# Patient Record
Sex: Female | Born: 1950 | Race: White | Hispanic: No | Marital: Married | Smoking: Never smoker
Health system: Southern US, Community
[De-identification: ages and names within clinical notes are randomized; demographics above are authoritative.]

## PROBLEM LIST (undated history)

## (undated) DIAGNOSIS — S82409A Unspecified fracture of shaft of unspecified fibula, initial encounter for closed fracture: Secondary | ICD-10-CM

## (undated) DIAGNOSIS — K589 Irritable bowel syndrome without diarrhea: Secondary | ICD-10-CM

## (undated) DIAGNOSIS — M810 Age-related osteoporosis without current pathological fracture: Secondary | ICD-10-CM

## (undated) DIAGNOSIS — J329 Chronic sinusitis, unspecified: Secondary | ICD-10-CM

## (undated) DIAGNOSIS — K449 Diaphragmatic hernia without obstruction or gangrene: Secondary | ICD-10-CM

## (undated) DIAGNOSIS — D500 Iron deficiency anemia secondary to blood loss (chronic): Secondary | ICD-10-CM

## (undated) DIAGNOSIS — F329 Major depressive disorder, single episode, unspecified: Secondary | ICD-10-CM

## (undated) DIAGNOSIS — T7840XA Allergy, unspecified, initial encounter: Secondary | ICD-10-CM

## (undated) DIAGNOSIS — E785 Hyperlipidemia, unspecified: Secondary | ICD-10-CM

## (undated) DIAGNOSIS — F32A Depression, unspecified: Secondary | ICD-10-CM

## (undated) DIAGNOSIS — R87619 Unspecified abnormal cytological findings in specimens from cervix uteri: Secondary | ICD-10-CM

## (undated) DIAGNOSIS — K219 Gastro-esophageal reflux disease without esophagitis: Secondary | ICD-10-CM

## (undated) DIAGNOSIS — D259 Leiomyoma of uterus, unspecified: Secondary | ICD-10-CM

## (undated) DIAGNOSIS — E041 Nontoxic single thyroid nodule: Secondary | ICD-10-CM

## (undated) DIAGNOSIS — J45909 Unspecified asthma, uncomplicated: Secondary | ICD-10-CM

## (undated) DIAGNOSIS — F419 Anxiety disorder, unspecified: Secondary | ICD-10-CM

## (undated) DIAGNOSIS — E119 Type 2 diabetes mellitus without complications: Secondary | ICD-10-CM

## (undated) DIAGNOSIS — D509 Iron deficiency anemia, unspecified: Secondary | ICD-10-CM

## (undated) DIAGNOSIS — I999 Unspecified disorder of circulatory system: Secondary | ICD-10-CM

## (undated) DIAGNOSIS — I10 Essential (primary) hypertension: Secondary | ICD-10-CM

## (undated) DIAGNOSIS — Z86010 Personal history of colonic polyps: Principal | ICD-10-CM

## (undated) DIAGNOSIS — K754 Autoimmune hepatitis: Secondary | ICD-10-CM

## (undated) DIAGNOSIS — K429 Umbilical hernia without obstruction or gangrene: Secondary | ICD-10-CM

## (undated) DIAGNOSIS — K31819 Angiodysplasia of stomach and duodenum without bleeding: Secondary | ICD-10-CM

## (undated) HISTORY — DX: Allergy, unspecified, initial encounter: T78.40XA

## (undated) HISTORY — DX: Iron deficiency anemia, unspecified: D50.9

## (undated) HISTORY — DX: Essential (primary) hypertension: I10

## (undated) HISTORY — DX: Unspecified disorder of circulatory system: I99.9

## (undated) HISTORY — DX: Depression, unspecified: F32.A

## (undated) HISTORY — DX: Personal history of colonic polyps: Z86.010

## (undated) HISTORY — PX: NASAL SEPTOPLASTY W/ TURBINOPLASTY: SHX2070

## (undated) HISTORY — DX: Leiomyoma of uterus, unspecified: D25.9

## (undated) HISTORY — DX: Nontoxic single thyroid nodule: E04.1

## (undated) HISTORY — DX: Diaphragmatic hernia without obstruction or gangrene: K44.9

## (undated) HISTORY — DX: Iron deficiency anemia secondary to blood loss (chronic): D50.0

## (undated) HISTORY — DX: Age-related osteoporosis without current pathological fracture: M81.0

## (undated) HISTORY — PX: COLONOSCOPY: SHX174

## (undated) HISTORY — DX: Irritable bowel syndrome, unspecified: K58.9

## (undated) HISTORY — DX: Unspecified asthma, uncomplicated: J45.909

## (undated) HISTORY — DX: Autoimmune hepatitis: K75.4

## (undated) HISTORY — DX: Umbilical hernia without obstruction or gangrene: K42.9

## (undated) HISTORY — DX: Anxiety disorder, unspecified: F41.9

## (undated) HISTORY — DX: Chronic sinusitis, unspecified: J32.9

## (undated) HISTORY — DX: Type 2 diabetes mellitus without complications: E11.9

## (undated) HISTORY — PX: CATARACT EXTRACTION: SUR2

## (undated) HISTORY — PX: EYE SURGERY: SHX253

## (undated) HISTORY — DX: Unspecified abnormal cytological findings in specimens from cervix uteri: R87.619

## (undated) HISTORY — DX: Hyperlipidemia, unspecified: E78.5

## (undated) HISTORY — DX: Gastro-esophageal reflux disease without esophagitis: K21.9

## (undated) HISTORY — PX: TONSILLECTOMY: SUR1361

## (undated) HISTORY — DX: Major depressive disorder, single episode, unspecified: F32.9

## (undated) HISTORY — DX: Unspecified fracture of shaft of unspecified fibula, initial encounter for closed fracture: S82.409A

## (undated) HISTORY — PX: BREAST EXCISIONAL BIOPSY: SUR124

## (undated) HISTORY — DX: Angiodysplasia of stomach and duodenum without bleeding: K31.819

---

## 1991-11-28 ENCOUNTER — Encounter: Payer: Self-pay | Admitting: Gastroenterology

## 1998-05-15 ENCOUNTER — Other Ambulatory Visit: Admission: RE | Admit: 1998-05-15 | Discharge: 1998-05-15 | Payer: Self-pay | Admitting: Obstetrics and Gynecology

## 1999-05-18 ENCOUNTER — Other Ambulatory Visit: Admission: RE | Admit: 1999-05-18 | Discharge: 1999-05-18 | Payer: Self-pay | Admitting: *Deleted

## 1999-06-05 ENCOUNTER — Encounter: Payer: Self-pay | Admitting: *Deleted

## 1999-06-05 ENCOUNTER — Encounter: Admission: RE | Admit: 1999-06-05 | Discharge: 1999-06-05 | Payer: Self-pay | Admitting: *Deleted

## 2000-01-11 ENCOUNTER — Encounter: Payer: Self-pay | Admitting: Gastroenterology

## 2000-01-11 ENCOUNTER — Ambulatory Visit (HOSPITAL_COMMUNITY): Admission: RE | Admit: 2000-01-11 | Discharge: 2000-01-11 | Payer: Self-pay | Admitting: Gastroenterology

## 2000-05-25 ENCOUNTER — Other Ambulatory Visit: Admission: RE | Admit: 2000-05-25 | Discharge: 2000-05-25 | Payer: Self-pay | Admitting: *Deleted

## 2000-06-09 ENCOUNTER — Encounter: Payer: Self-pay | Admitting: *Deleted

## 2000-06-09 ENCOUNTER — Encounter: Admission: RE | Admit: 2000-06-09 | Discharge: 2000-06-09 | Payer: Self-pay | Admitting: *Deleted

## 2001-06-01 ENCOUNTER — Other Ambulatory Visit: Admission: RE | Admit: 2001-06-01 | Discharge: 2001-06-01 | Payer: Self-pay | Admitting: *Deleted

## 2001-06-12 ENCOUNTER — Encounter: Admission: RE | Admit: 2001-06-12 | Discharge: 2001-06-12 | Payer: Self-pay | Admitting: *Deleted

## 2001-06-12 ENCOUNTER — Encounter: Payer: Self-pay | Admitting: *Deleted

## 2002-02-12 HISTORY — PX: ESOPHAGOGASTRODUODENOSCOPY: SHX1529

## 2002-02-20 ENCOUNTER — Encounter (INDEPENDENT_AMBULATORY_CARE_PROVIDER_SITE_OTHER): Payer: Self-pay | Admitting: Gastroenterology

## 2002-06-05 ENCOUNTER — Other Ambulatory Visit: Admission: RE | Admit: 2002-06-05 | Discharge: 2002-06-05 | Payer: Self-pay | Admitting: *Deleted

## 2002-06-15 ENCOUNTER — Encounter: Payer: Self-pay | Admitting: *Deleted

## 2002-06-15 ENCOUNTER — Encounter: Admission: RE | Admit: 2002-06-15 | Discharge: 2002-06-15 | Payer: Self-pay | Admitting: *Deleted

## 2003-06-25 ENCOUNTER — Encounter: Admission: RE | Admit: 2003-06-25 | Discharge: 2003-06-25 | Payer: Self-pay | Admitting: *Deleted

## 2003-08-08 ENCOUNTER — Other Ambulatory Visit: Admission: RE | Admit: 2003-08-08 | Discharge: 2003-08-08 | Payer: Self-pay | Admitting: *Deleted

## 2004-08-10 ENCOUNTER — Ambulatory Visit: Payer: Self-pay | Admitting: Gastroenterology

## 2004-08-20 ENCOUNTER — Ambulatory Visit: Payer: Self-pay | Admitting: Gastroenterology

## 2004-09-22 ENCOUNTER — Other Ambulatory Visit: Admission: RE | Admit: 2004-09-22 | Discharge: 2004-09-22 | Payer: Self-pay | Admitting: Obstetrics and Gynecology

## 2004-10-01 ENCOUNTER — Ambulatory Visit: Payer: Self-pay | Admitting: Gastroenterology

## 2004-10-23 ENCOUNTER — Ambulatory Visit: Payer: Self-pay | Admitting: Gastroenterology

## 2004-11-18 ENCOUNTER — Ambulatory Visit: Payer: Self-pay | Admitting: Gastroenterology

## 2005-01-04 ENCOUNTER — Ambulatory Visit: Payer: Self-pay | Admitting: Gastroenterology

## 2005-03-17 ENCOUNTER — Ambulatory Visit: Payer: Self-pay | Admitting: Gastroenterology

## 2005-06-30 ENCOUNTER — Ambulatory Visit: Payer: Self-pay | Admitting: Gastroenterology

## 2005-10-11 ENCOUNTER — Ambulatory Visit (HOSPITAL_COMMUNITY): Admission: RE | Admit: 2005-10-11 | Discharge: 2005-10-11 | Payer: Self-pay | Admitting: Allergy

## 2006-02-21 ENCOUNTER — Ambulatory Visit: Payer: Self-pay | Admitting: Internal Medicine

## 2006-05-27 ENCOUNTER — Ambulatory Visit: Payer: Self-pay | Admitting: Gastroenterology

## 2006-05-27 LAB — CONVERTED CEMR LAB
ALT: 28 units/L (ref 0–40)
Alkaline Phosphatase: 92 units/L (ref 39–117)
Basophils Absolute: 0 10*3/uL (ref 0.0–0.1)
Chloride: 93 meq/L — ABNORMAL LOW (ref 96–112)
Eosinophil percent: 1.7 % (ref 0.0–5.0)
Glucose, Bld: 131 mg/dL — ABNORMAL HIGH (ref 70–99)
Lymphocytes Relative: 34.6 % (ref 12.0–46.0)
MCHC: 34 g/dL (ref 30.0–36.0)
MCV: 100.6 fL — ABNORMAL HIGH (ref 78.0–100.0)
Monocytes Relative: 7.2 % (ref 3.0–11.0)
Neutro Abs: 4.9 10*3/uL (ref 1.4–7.7)
Platelets: 251 10*3/uL (ref 150–400)
Potassium: 3.3 meq/L — ABNORMAL LOW (ref 3.5–5.1)
Sodium: 133 meq/L — ABNORMAL LOW (ref 135–145)

## 2006-10-28 ENCOUNTER — Ambulatory Visit: Payer: Self-pay | Admitting: Gastroenterology

## 2006-10-28 LAB — CONVERTED CEMR LAB
Basophils Absolute: 0.1 10*3/uL (ref 0.0–0.1)
Bilirubin, Direct: 0.1 mg/dL (ref 0.0–0.3)
CO2: 31 meq/L (ref 19–32)
Eosinophils Absolute: 0.1 10*3/uL (ref 0.0–0.6)
GFR calc Af Amer: 112 mL/min
Glucose, Bld: 99 mg/dL (ref 70–99)
HCT: 44.7 % (ref 36.0–46.0)
MCHC: 35.1 g/dL (ref 30.0–36.0)
MCV: 99.6 fL (ref 78.0–100.0)
Monocytes Absolute: 0.7 10*3/uL (ref 0.2–0.7)
Neutro Abs: 7.1 10*3/uL (ref 1.4–7.7)
Neutrophils Relative %: 63.3 % (ref 43.0–77.0)
Potassium: 3.1 meq/L — ABNORMAL LOW (ref 3.5–5.1)
RBC: 4.48 M/uL (ref 3.87–5.11)
Sodium: 136 meq/L (ref 135–145)
Total Protein: 7.4 g/dL (ref 6.0–8.3)

## 2006-12-12 LAB — HM DEXA SCAN: HM Dexa Scan: NORMAL

## 2007-03-02 ENCOUNTER — Encounter: Admission: RE | Admit: 2007-03-02 | Discharge: 2007-03-02 | Payer: Self-pay | Admitting: Obstetrics and Gynecology

## 2007-10-24 DIAGNOSIS — K219 Gastro-esophageal reflux disease without esophagitis: Secondary | ICD-10-CM | POA: Insufficient documentation

## 2007-10-24 DIAGNOSIS — E1165 Type 2 diabetes mellitus with hyperglycemia: Secondary | ICD-10-CM

## 2007-10-24 DIAGNOSIS — IMO0002 Reserved for concepts with insufficient information to code with codable children: Secondary | ICD-10-CM | POA: Insufficient documentation

## 2007-10-24 DIAGNOSIS — F411 Generalized anxiety disorder: Secondary | ICD-10-CM | POA: Insufficient documentation

## 2007-10-26 ENCOUNTER — Ambulatory Visit: Payer: Self-pay | Admitting: Internal Medicine

## 2007-10-26 DIAGNOSIS — K589 Irritable bowel syndrome without diarrhea: Secondary | ICD-10-CM | POA: Insufficient documentation

## 2007-10-27 LAB — CONVERTED CEMR LAB
Bilirubin, Direct: 0.1 mg/dL (ref 0.0–0.3)
Total Bilirubin: 0.9 mg/dL (ref 0.3–1.2)

## 2008-10-22 ENCOUNTER — Encounter: Admission: RE | Admit: 2008-10-22 | Discharge: 2008-10-22 | Payer: Self-pay | Admitting: Family Medicine

## 2008-10-30 ENCOUNTER — Ambulatory Visit: Payer: Self-pay | Admitting: Internal Medicine

## 2009-06-20 ENCOUNTER — Encounter (INDEPENDENT_AMBULATORY_CARE_PROVIDER_SITE_OTHER): Payer: Self-pay | Admitting: *Deleted

## 2009-06-23 ENCOUNTER — Ambulatory Visit: Payer: Self-pay | Admitting: Internal Medicine

## 2009-07-07 ENCOUNTER — Ambulatory Visit: Payer: Self-pay | Admitting: Internal Medicine

## 2009-07-07 DIAGNOSIS — Z86010 Personal history of colonic polyps: Secondary | ICD-10-CM

## 2009-07-07 DIAGNOSIS — Z860101 Personal history of adenomatous and serrated colon polyps: Secondary | ICD-10-CM

## 2009-07-07 HISTORY — DX: Personal history of adenomatous and serrated colon polyps: Z86.0101

## 2009-07-07 HISTORY — DX: Personal history of colonic polyps: Z86.010

## 2009-07-09 ENCOUNTER — Encounter: Payer: Self-pay | Admitting: Internal Medicine

## 2009-10-27 ENCOUNTER — Telehealth: Payer: Self-pay | Admitting: Internal Medicine

## 2010-05-20 ENCOUNTER — Ambulatory Visit: Payer: Self-pay | Admitting: Internal Medicine

## 2010-07-06 ENCOUNTER — Encounter: Payer: Self-pay | Admitting: Obstetrics and Gynecology

## 2010-07-16 NOTE — Assessment & Plan Note (Signed)
Summary: FOLLOW UP CROHN'S/GERD//SP   History of Present Illness Visit Type: Follow-up Visit Primary GI MD: Stan Head MD [facility] Primary Jalexus Brett: Grover Canavan, MD Chief Complaint: IBS, Anxiety  and GERD follow-up History of Present Illness:   60 yo ww with IBS, anxiety and GERD. IBS helped by Effexor and alprazolam therapy and Prevacid is controlling GERD. In general doing well with stable rare IBS spells and control of GERD and anxiety. Mother currently in hospital with cerebral hemorrhage which has flared anxiety some.    GI Review of Systems      Denies abdominal pain, acid reflux, belching, bloating, chest pain, dysphagia with liquids, dysphagia with solids, heartburn, loss of appetite, nausea, vomiting, vomiting blood, weight loss, and  weight gain.        Denies anal fissure, black tarry stools, change in bowel habit, constipation, diarrhea, diverticulosis, fecal incontinence, heme positive stool, hemorrhoids, irritable bowel syndrome, jaundice, light color stool, liver problems, rectal bleeding, and  rectal pain.    Current Medications (verified): 1)  Alprazolam 0.5 Mg  Tbdp (Alprazolam) .... Take 1 Tab By Mouth At Bedtime As Needed 2)  Effexor Xr 75 Mg  Cp24 (Venlafaxine Hcl) .... Take 1 Capsule By Mouth Once A Day--Must Make Appt For Further Refills 3)  Maxzide-25 37.5-25 Mg  Tabs (Triamterene-Hctz) .... Once Daily 4)  Prevacid 30 Mg  Cpdr (Lansoprazole) .... Once Daily 5)  Singulair 10 Mg  Tabs (Montelukast Sodium) .... Once Daily 6)  Astelin 137 Mcg/spray  Soln (Azelastine Hcl) .... As Needed 7)  Zyrtec Allergy 10 Mg  Tabs (Cetirizine Hcl) .... As Needed 8)  Simvastatin 20 Mg  Tabs (Simvastatin) .... Take One Every Other Day 9)  Aspirtab 324 Mg Tbec (Aspirin) .... Take One By Mouth As Needed 10)  Benadryl 25 Mg Tabs (Diphenhydramine Hcl) .... Take One By Mouth At Night 11)  Omnaris 50 Mcg/act Susp (Ciclesonide) .... Spray Each Nostril in Morning 12)  Ketoprofen 75 Mg  Caps (Ketoprofen) .... Take One By Mouth As Needed 13)  Optivar 0.05 % Soln (Azelastine Hcl) .... Use in Both Eyes in The Morning 14)  Penicillin V Potassium 500 Mg Tabs (Penicillin V Potassium) .... Take 1 Tablet By Mouth Three Times A Day  Allergies (verified): No Known Drug Allergies  Past History:  Past Medical History: Reviewed history from 10/26/2007 and no changes required. Allergic Rhinitis IBS ANXIETY (ICD-300.00) GERD (ICD-530.81) Left ankle fx Abnormal LFTs Anal Fissure Symptomatic Hemorrhoids  Past Surgical History: Reviewed history from 10/26/2007 and no changes required. nasal septoplasty age 24 tonsillectomy right breast bx, benign 1997  Family History: Reviewed history from 10/30/2008 and no changes required. Family History of Colon Cancer: dad age 77 deceased Family History of Breast Cancer: paternal grandmother Throat cancer : maternal grandfather Family History of lung cancer: paternal grandfather Family History of Pancreatic Cancer:maternal aunt  Social History: Reviewed history from 10/30/2008 and no changes required. Occupation: Best boy Patient has never smoked.  Alcohol Use - yes - Rare Daily Caffeine Use Green tea Illicit Drug Use - no Patient does not get regular exercise.  used to walk  Review of Systems       The patient complains of allergy/sinus.         she follows with Dr. Milus Glazier and LFT's ok, he is treating cholesterol.  Vital Signs:  Patient profile:   60 year old female Height:      66 inches Weight:      152 pounds BMI:  24.62 Pulse rate:   88 / minute Pulse rhythm:   regular BP sitting:   122 / 80  (left arm) Cuff size:   regular  Vitals Entered By: June McMurray CMA Duncan Dull) (May 20, 2010 4:03 PM)  Physical Exam  General:  Well developed, well nourished, no acute distress.   Impression & Recommendations:  Problem # 1:  IRRITABLE BOWEL SYNDROME (ICD-564.1) Assessment Unchanged Has noticed  she will vomit with spells at times - new. She thinks things are overall stable and unchanged otherwise.  Problem # 2:  GERD (ICD-530.81) Assessment: Unchanged doing well on lansoprazole. Continue, refill.  Problem # 3:  ANXIETY (ICD-300.00) Assessment: Deteriorated slightly worse with Mom's problems. Continue effexor and alprazolam.  Patient Instructions: 1)  Please continue current medications.  2)  Please schedule a follow-up appointment in 1 year. 3)  Please schedule a follow-up appointment as needed if problems arise before then.  4)  Copy sent to : Elvina Sidle, MD 5)  The medication list was reviewed and reconciled.  All changed / newly prescribed medications were explained.  A complete medication list was provided to the patient / caregiver. Prescriptions: ALPRAZOLAM 0.5 MG  TBDP (ALPRAZOLAM) Take 1 tab by mouth at bedtime as needed  #30 x 0   Entered and Authorized by:   Iva Boop MD, [facility]   Signed by:   Iva Boop MD, [facility] on 05/20/2010   Method used:   Printed then faxed to ...       [facility] [phone]* (retail)       [phone] [address]       [place], [place]  [address]       Ph: [phone]       Fax: [phone]   RxID:   [id number] PREVACID 30 MG  CPDR (LANSOPRAZOLE) once daily  #30 Capsule x 11   Entered and Authorized by:   Iva Boop MD, [facility]   Signed by:   Iva Boop MD, [facility] on 05/20/2010   Method used:   Electronically to        [facility]. [phone]* (retail)       [phone] [address]       [place], [place]  [address]       Ph: [phone]       Fax: [phone]   RxID:   [id number] EFFEXOR XR 75 MG  CP24 (VENLAFAXINE HCL) Take 1 capsule by mouth once a day--MUST MAKE APPT FOR FURTHER REFILLS  #30 Capsule x 1   Entered and Authorized by:   Iva Boop MD, [facility]   Signed by:   Iva Boop MD, [facility] on 05/20/2010   Method used:   Electronically to         [facility]. [phone]* (retail)       [phone] [address]       [place], [place]  [address]       Ph: [phone]       Fax: [phone]   RxID:   [id number]

## 2010-07-16 NOTE — Procedures (Signed)
Summary: Colonoscopy: tubular adenoma  Patient: Miranda Cochran Note: All result statuses are Final unless otherwise noted.  Tests: (1) Colonoscopy (COL)   COL Colonoscopy           DONE     [facility]     [address]     [place], [place]  [address]           COLONOSCOPY PROCEDURE REPORT           PATIENT:  Miranda, Cochran  MR#:  [id number]     BIRTHDATE:  07/28/1950, 58 yrs. old  GENDER:  female           ENDOSCOPIST:  Iva Boop, MD, [facility]           PROCEDURE DATE:  07/07/2009     PROCEDURE:  Colonoscopy with snare polypectomy     ASA CLASS:  Class II     INDICATIONS:  Elevated Risk Screening Father diagnosed with colon     cancer in Oct 29, 2022, died at 95 from colon cancer           MEDICATIONS:   Fentanyl 100 mcg IV, Versed 10 mg IV, Benadryl 12.5     mg IV           DESCRIPTION OF PROCEDURE:   After the risks benefits and     alternatives of the procedure were thoroughly explained, informed     consent was obtained.  Digital rectal exam was performed and     revealed no abnormalities.   The LB [id number]-[id number] endoscope     was introduced through the anus and advanced to the cecum, which     was identified by both the appendix and ileocecal valve, without     limitations.  The quality of the prep was good, using MiraLax.     The instrument was then slowly withdrawn as the colon was fully     examined.     Insertion: 11:37 minutes Withdrawal: 17:00 minutes     <<PROCEDUREIMAGES>>           FINDINGS:  Two polyps were found. Two diminutive polyps,     approximately 304 mm maximum size. Polyps were snared without     cautery. Retrieval was successful. snare polyp  This was otherwise     a normal examination of the colon. IBS response to passage of     colonoscope.   Retroflexed views in the rectum revealed no     abnormalities.    The scope was then withdrawn from the patient     and the procedure completed.           COMPLICATIONS:  None           ENDOSCOPIC  IMPRESSION:     1) Two diminutive polyps removed     2) Otherwise normal examination, good prep     3) Family history of colon cancer - father     RECOMMENDATIONS:     1) Await pathology results     2) Consider deep sedation next exam           REPEAT EXAM:  In 5 year(s) for routine colonoscopy for polyp     surveillance.  unlesspolyp patholgy suggests otherwise           Iva Boop, MD, [facility]           CC:  The Patient           n.  eSIGNED:   Iva Boop at 07/07/2009 10:00 AM           Kelle Darting, [phone]  Note: An exclamation mark (!) indicates a result that was not dispersed into the flowsheet. Document Creation Date: 07/07/2009 10:03 AM _______________________________________________________________________  (1) Order result status: Final Collection or observation date-time: 07/07/2009 09:50 Requested date-time:  Receipt date-time:  Reported date-time:  Referring Physician:   Ordering Physician: Stan Head [phone]) Specimen Source:  Source: Launa Grill Order Number: [id number] Lab site:   Appended Document: Colonoscopy     Procedures Next Due Date:    Colonoscopy: 07/2014  Appended Document: Colonoscopy   Colonoscopy  Procedure date:  07/07/2009  Findings:      ENDOSCOPIC IMPRESSION:     1) Two diminutive polyps removed     2) Otherwise normal examination, good prep     3) Family history of colon cancer - father  ***Microscopic Examination and Diagnosis***    1. COLON, POLYP(S), SIGMOID, ASCENDING : - ONE TUBULAR ADENOMA AND ONE HYPERPLASTIC POLYP   - no high grade dysplasia or invasive malignancy identified.  Comments:      Repeat colonoscopy in 5 years.   Procedures Next Due Date:    Colonoscopy: 07/2014

## 2010-07-16 NOTE — Letter (Signed)
Summary: Patient Notice- Polyp Results  [facility]  [address] [place], [place] [address]   Phone: [phone]  Fax: [phone]        July 09, 2009 MRN: [id number]    [facility] [address] [place], [place]  [address]    Dear Ms. BOWMAN,  One of the polyps removed from your colon were adenomatous. This means that they were pre-cancerous or that  they had the potential to change into cancer over time. The other polyp was not pre-cancerous.  I recommend that you have a repeat colonoscopy in 5 years to determine if you have developed any new polyps over time. If you develop any new rectal bleeding, abdominal pain or significant bowel habit changes, please contact us before then.   In addition to repeating colonoscopy, changing health habits may reduce your risk of having more colon polyps and possibly, colon cancer. You may lower your risk of future polyps and colon cancer by adopting healthy habits such as not smoking or using tobacco (if you do), being physically active, losing weight (if overweight), and eating a diet which includes fruits and vegetables and limits red meat.   Please call us if you are having persistent problems or have questions about your condition that have not been fully answered at this time.  Sincerely,  Iva Boop MD, [facility]  This letter has been electronically signed by your physician.  Appended Document: Patient Notice- Polyp Results Letter mailed 1.27.11

## 2010-07-16 NOTE — Miscellaneous (Signed)
Summary: [facility] Previsit/prep  Clinical Lists Changes  Medications: Added new medication of MOVIPREP 100 GM  SOLR (PEG-KCL-NACL-NASULF-NA ASC-C) As per prep instructions. - Signed Rx of MOVIPREP 100 GM  SOLR (PEG-KCL-NACL-NASULF-NA ASC-C) As per prep instructions.;  #1 x 0;  Signed;  Entered by: Wyona Almas RN;  Authorized by: Iva Boop MD, [facility];  Method used: Electronically to [facility]. #[id number]*, [address] [place], [place], [place]  [address], Ph: [phone], Fax: [phone] Observations: Added new observation of NKA: T (06/23/2009 8:09)    Prescriptions: MOVIPREP 100 GM  SOLR (PEG-KCL-NACL-NASULF-NA ASC-C) As per prep instructions.  #1 x 0   Entered by:   Wyona Almas RN   Authorized by:   Iva Boop MD, [facility]   Signed by:   Wyona Almas RN on 06/23/2009   Method used:   Electronically to        [facility]. [phone]* (retail)       [phone] [address]       [place], [place]  [address]       Ph: [phone]       Fax: [phone]   RxID:   [id number]

## 2010-07-16 NOTE — Progress Notes (Signed)
Summary: Coding Complaint  Phone Note Call from Patient Call back at Home Phone [phone]   Caller: Patient Call For: Dr Leone Payor Reason for Call: Talk to Doctor, Insurance Question Details for Reason: Coding  Summary of Call: Pt called extremely upset and arguementative over receiving a bill for her colonoscopy; Advised that according to her insurance policy, they would pay 100% for screening, but if polyps/biopsies removed, her insurance considers it diagnostic. Pt refuses to listen and demanded that Dr Leone Payor call her today. Unable to reach any resolution with pt or even help provide explanation as pt is interruptive and demanding that she only speak to Dr Leone Payor himself. Initial call taken by: Dwan Bolt,  Oct 27, 2009 11:14 AM  Follow-up for Phone Call        Looked at codes in IDX and billing. When entered, they were put in correctly, but have been flipped. I spoke with Anabell and she will refile this claim with prim code being scrng code and then findings. I also spoke with patient and have advised her that we will do this and if she should have further questions, please feel free to call and  I have advised her that I will no longer be here after the 27th , so to make she asks to speak to the director/new manager, she verbalized understanding. Follow-up by: Zackery Barefoot,  Oct 30, 2009 11:03 AM

## 2010-07-16 NOTE — Letter (Signed)
Summary: [facility] Instructions  [facility]  [address] [place], [place] [address]   Phone: [phone]  Fax: [phone]       Miranda Cochran    [birth year]    MRN: [id number]       Procedure Day Dorna Bloom:  Duanne Limerick  07/07/2009     Arrival Time: 10:30AM     Procedure Time:  11:30AM     Location of Procedure:                    _X _  [facility] (4th Floor)        PREPARATION FOR COLONOSCOPY WITH MIRALAX  Starting 5 days prior to your procedure 07/02/09 do not eat nuts, seeds, popcorn, corn, beans, peas,  salads, or any raw vegetables.  Do not take any fiber supplements (e.g. Metamucil, Citrucel, and Benefiber). ____________________________________________________________________________________________________   THE DAY BEFORE YOUR PROCEDURE         DATE: 07/06/09 DAY: SUNDAY  1   Drink clear liquids the entire day-NO SOLID FOOD  2   Do not drink anything colored red or purple.  Avoid juices with pulp.  No orange juice.  3   Drink at least 64 oz. (8 glasses) of fluid/clear liquids during the day to prevent dehydration and help the prep work efficiently.  CLEAR LIQUIDS INCLUDE: Water Jello Ice Popsicles Tea (sugar ok, no milk/cream) Powdered fruit flavored drinks Coffee (sugar ok, no milk/cream) Gatorade Juice: apple, white grape, white cranberry  Lemonade Clear bullion, consomm, broth Carbonated beverages (any kind) Strained chicken noodle soup Hard Candy  4   Mix the entire bottle of Miralax with 64 oz. of Gatorade/Powerade in the morning and put in the refrigerator to chill.  5   At 3:00 pm take 2 Dulcolax/Bisacodyl tablets.  6   At 4:30 pm take one Reglan/Metoclopramide tablet.  7  Starting at 5:00 pm drink one 8 oz glass of the Miralax mixture every 15-20 minutes until you have finished drinking the entire 64 oz.  You should finish drinking prep around 7:30 or 8:00 pm.  8   If you are nauseated, you may take the 2nd  Reglan/Metoclopramide tablet at 6:30 pm.        9    At 8:00 pm take 2 more DULCOLAX/Bisacodyl tablets.     THE DAY OF YOUR PROCEDURE      DATE:  07/07/09 DAY: Duanne Limerick  You may drink clear liquids until 9:30AM  (2 HOURS BEFORE PROCEDURE).   MEDICATION INSTRUCTIONS  Unless otherwise instructed, you should take regular prescription medications with a small sip of water as early as possible the morning of your procedure.   Additional medication instructions: Hold Maxide the morning of procedure.         OTHER INSTRUCTIONS  You will need a responsible adult at least 59 years of age to accompany you and drive you home.   This person must remain in the waiting room during your procedure.  Wear loose fitting clothing that is easily removed.  Leave jewelry and other valuables at home.  However, you may wish to bring a book to read or an iPod/MP3 player to listen to music as you wait for your procedure to start.  Remove all body piercing jewelry and leave at home.  Total time from sign-in until discharge is approximately 2-3 hours.  You should go home directly after your procedure and rest.  You can resume normal activities the day after your procedure.  The day of your procedure you should not:   Drive   Make legal decisions   Operate machinery   Drink alcohol   Return to work  You will receive specific instructions about eating, activities and medications before you leave.   The above instructions have been reviewed and explained to me by   _______________________    I fully understand and can verbalize these instructions _____________________________ Date _______

## 2010-10-27 NOTE — Assessment & Plan Note (Signed)
[facility]                         GASTROENTEROLOGY OFFICE NOTE   Miranda Cochran, Miranda Cochran                       MRN:          [id number]  DATE:10/28/2006                            DOB:          1951-02-22    Miranda Cochran comes in on Oct 28, 2006 complaining of some reflux and her liver  function studies are normal and that she has cut back on her alcohol and  she admits to this.  Her electrolytes were really abnormal.  [name]  that we could not explain this, this was back in January, so [name]  we needed to repeat this.  MCV was slightly elevated so we will get a  B12 level, folate level as well.  [name] she really needs to  followup on a yearly basis with a primary care doctor and get physicals  done.  This is a nice lady and sees Dr. Kerry Kass as well and I  am going to have her followup with one of my colleagues on my  retirement.  She says she could not take the Ambien, it was too strong  so she uses Xanax.  I gave her some Xanax and she takes also Effexor,  Maxzide, Prevacid, Singulair, Astelin, Zyrtec.  [name] I would  really like to see a primary care doctor and have him prescribe some of  these medications because I did not feel comfortable treating her for  primary care problems outside of gastroenterology.   PHYSICAL EXAMINATION:  Exam was basically unremarkable.  She weighed 151, blood pressure 110/72, pulse 62 and regular.  Her color was good. Oropharynx was negative.  NECK:  Negative.  CHEST:  Was clear.  HEART:  Revealed a regular rate and rhythm.  ABDOMEN:  Was soft, no mass or organomegaly.  Non-tender.  EXTREMITIES:  Unremarkable.  RECTAL:  Deferred.   IMPRESSION:  1. History of gastroesophageal reflux disease.  2. Diabetes.  3. Anxiety and depression.  4. Hepatitis - resolved with decreased alcohol intake.   RECOMMENDATIONS:  To refill the Xanax and [name] to followup  sometime in the near future but that  I would advise her to get a primary  care doctor for her overall care.     Ulyess Mort, MD  Electronically Signed    SML/MedQ  DD: 10/28/2006  DT: 10/28/2006  Job #: [id number]

## 2010-10-30 NOTE — Assessment & Plan Note (Signed)
[facility]                           GASTROENTEROLOGY OFFICE NOTE   Miranda Cochran, Miranda Cochran                       MRN:          [id number]  DATE:02/21/2006                            DOB:          1950-09-09   Miranda Cochran comes in on February 21, 2006, prescription refills, says she is  feeling fairly good, denies any real significant problems as long as she is  taking her medication.  She says she is starting to use more alcohol which I  had advised her against when she was having abnormal liver function studies.   She is presently taking:  1. Effexor 75 mg daily.  2. Maxzide 25 mg, she uses p.r.n.  3. Xanax 0.5 p.r.n.  4. Prevacid daily.   PHYSICAL EXAMINATION:  VITAL SIGNS:  She weighed 154, blood pressure 124/82,  pulse rate 56 and regular.  HEENT: EOMI. PERRLA. Sclerae are anicteric.  Conjunctivae are pink.  NECK:  Supple without thyromegaly, adenopathy or carotid bruits.  CHEST:  Clear to auscultation and percussion without adventitious sounds.  CARDIAC:  Regular rhythm; normal S1 S2.  There are no murmurs, gallops or  rubs.  ABDOMEN:  Bowel sounds are normoactive.  Abdomen is soft, non-tender and non-  distended.  There are no abdominal masses, tenderness, splenic enlargement  or hepatomegaly.  EXTREMITIES:  Full range of motion.  No cyanosis, clubbing or edema.  RECTAL:  There are no masses.  Stool is Hemoccult negative.   IMPRESSION:  1. Gastroesophageal reflux disease.  2. Irritable bowel syndrome.  3. Anxiety/depression.  4. Hepatitis, resolving.   RECOMMENDATION:  1. Try Ambien 6.25 mg h.s. for sleep.  2. Use Xanax 0.25 mg, one or two daily for anxiety if needed.  3. I gave her a prescription for the Effexor 75 mg a day at bedtime.  4. And, Prevacid 30 mg q.a.m.  Get refills on these.  5. [name] I would send this information to Dr. Merla Riches.  6. That the patient, on the other hand, would certainly be managed in our      blood  pressure with Maxzide as needed and so on.  7. We did order some blood studies on her as well for completeness.                                  Ulyess Mort, MD  SML/MedQ  DD:  02/21/2006 DT:  02/22/2006 Job #:  [id number]   cc:   Harrel Lemon. Merla Riches, M.D.

## 2010-11-26 ENCOUNTER — Other Ambulatory Visit: Payer: Self-pay | Admitting: Family Medicine

## 2010-11-26 DIAGNOSIS — Z1231 Encounter for screening mammogram for malignant neoplasm of breast: Secondary | ICD-10-CM

## 2010-11-27 ENCOUNTER — Other Ambulatory Visit: Payer: Self-pay | Admitting: Family Medicine

## 2010-11-27 DIAGNOSIS — E041 Nontoxic single thyroid nodule: Secondary | ICD-10-CM

## 2010-11-30 LAB — HM PAP SMEAR: HM Pap smear: NORMAL

## 2010-12-02 ENCOUNTER — Ambulatory Visit
Admission: RE | Admit: 2010-12-02 | Discharge: 2010-12-02 | Disposition: A | Discharge status: Home / Self Care | Payer: PRIVATE HEALTH INSURANCE | Source: Ambulatory Visit | Attending: Family Medicine | Admitting: Family Medicine

## 2010-12-02 DIAGNOSIS — E041 Nontoxic single thyroid nodule: Secondary | ICD-10-CM

## 2010-12-07 ENCOUNTER — Other Ambulatory Visit: Payer: Self-pay | Admitting: Family Medicine

## 2010-12-07 DIAGNOSIS — E041 Nontoxic single thyroid nodule: Secondary | ICD-10-CM

## 2010-12-08 ENCOUNTER — Ambulatory Visit
Admission: RE | Admit: 2010-12-08 | Discharge: 2010-12-08 | Disposition: A | Discharge status: Home / Self Care | Payer: PRIVATE HEALTH INSURANCE | Source: Ambulatory Visit | Attending: Family Medicine | Admitting: Family Medicine

## 2010-12-08 DIAGNOSIS — Z1231 Encounter for screening mammogram for malignant neoplasm of breast: Secondary | ICD-10-CM

## 2010-12-15 ENCOUNTER — Ambulatory Visit
Admission: RE | Admit: 2010-12-15 | Discharge: 2010-12-15 | Disposition: A | Discharge status: Home / Self Care | Payer: PRIVATE HEALTH INSURANCE | Source: Ambulatory Visit | Attending: Family Medicine | Admitting: Family Medicine

## 2010-12-15 ENCOUNTER — Other Ambulatory Visit (HOSPITAL_COMMUNITY)
Admission: RE | Admit: 2010-12-15 | Discharge: 2010-12-15 | Disposition: A | Discharge status: Home / Self Care | Payer: PRIVATE HEALTH INSURANCE | Source: Ambulatory Visit | Attending: Interventional Radiology | Admitting: Interventional Radiology

## 2010-12-15 ENCOUNTER — Other Ambulatory Visit: Payer: Self-pay | Admitting: Interventional Radiology

## 2010-12-15 DIAGNOSIS — E041 Nontoxic single thyroid nodule: Secondary | ICD-10-CM

## 2010-12-15 DIAGNOSIS — E049 Nontoxic goiter, unspecified: Secondary | ICD-10-CM | POA: Insufficient documentation

## 2011-01-05 ENCOUNTER — Ambulatory Visit (INDEPENDENT_AMBULATORY_CARE_PROVIDER_SITE_OTHER): Payer: PRIVATE HEALTH INSURANCE | Admitting: General Surgery

## 2011-04-29 DIAGNOSIS — E041 Nontoxic single thyroid nodule: Secondary | ICD-10-CM | POA: Insufficient documentation

## 2011-05-14 LAB — HEPATIC FUNCTION PANEL
ALT: 38 U/L — AB (ref 7–35)
Alkaline Phosphatase: 86 U/L (ref 25–125)

## 2011-05-14 LAB — LIPID PANEL
Cholesterol: 238 mg/dL — AB (ref 0–200)
HDL: 40 mg/dL (ref 35–70)
Triglycerides: 386 mg/dL — AB (ref 40–160)

## 2011-05-14 LAB — CBC AND DIFFERENTIAL
HCT: 40 % (ref 36–46)
Hemoglobin: 13.3 g/dL (ref 12.0–16.0)
Platelets: 277 10*3/uL (ref 150–399)

## 2011-05-14 LAB — BASIC METABOLIC PANEL: Potassium: 3.3 mmol/L — AB (ref 3.4–5.3)

## 2011-05-31 ENCOUNTER — Ambulatory Visit (INDEPENDENT_AMBULATORY_CARE_PROVIDER_SITE_OTHER): Payer: PRIVATE HEALTH INSURANCE

## 2011-05-31 DIAGNOSIS — J019 Acute sinusitis, unspecified: Secondary | ICD-10-CM

## 2011-06-02 ENCOUNTER — Ambulatory Visit (INDEPENDENT_AMBULATORY_CARE_PROVIDER_SITE_OTHER): Payer: PRIVATE HEALTH INSURANCE

## 2011-06-02 DIAGNOSIS — J209 Acute bronchitis, unspecified: Secondary | ICD-10-CM

## 2011-07-05 ENCOUNTER — Encounter: Payer: Self-pay | Admitting: Family Medicine

## 2011-07-05 ENCOUNTER — Ambulatory Visit (INDEPENDENT_AMBULATORY_CARE_PROVIDER_SITE_OTHER): Payer: PRIVATE HEALTH INSURANCE

## 2011-07-05 DIAGNOSIS — J019 Acute sinusitis, unspecified: Secondary | ICD-10-CM

## 2011-07-05 DIAGNOSIS — E041 Nontoxic single thyroid nodule: Secondary | ICD-10-CM

## 2011-07-05 DIAGNOSIS — K449 Diaphragmatic hernia without obstruction or gangrene: Secondary | ICD-10-CM | POA: Insufficient documentation

## 2011-07-05 DIAGNOSIS — J329 Chronic sinusitis, unspecified: Secondary | ICD-10-CM

## 2011-07-05 DIAGNOSIS — I10 Essential (primary) hypertension: Secondary | ICD-10-CM | POA: Insufficient documentation

## 2011-07-05 DIAGNOSIS — E559 Vitamin D deficiency, unspecified: Secondary | ICD-10-CM | POA: Insufficient documentation

## 2011-07-05 DIAGNOSIS — J9801 Acute bronchospasm: Secondary | ICD-10-CM

## 2011-07-05 DIAGNOSIS — E785 Hyperlipidemia, unspecified: Secondary | ICD-10-CM

## 2011-07-05 DIAGNOSIS — K635 Polyp of colon: Secondary | ICD-10-CM | POA: Insufficient documentation

## 2011-07-12 ENCOUNTER — Ambulatory Visit (INDEPENDENT_AMBULATORY_CARE_PROVIDER_SITE_OTHER): Payer: PRIVATE HEALTH INSURANCE

## 2011-07-12 DIAGNOSIS — E049 Nontoxic goiter, unspecified: Secondary | ICD-10-CM

## 2011-07-12 DIAGNOSIS — J329 Chronic sinusitis, unspecified: Secondary | ICD-10-CM

## 2011-07-26 ENCOUNTER — Other Ambulatory Visit: Payer: Self-pay | Admitting: Family Medicine

## 2011-07-26 MED ORDER — AZELASTINE HCL 0.05 % OP SOLN: 1.0000 [drp] | Freq: Two times a day (BID) | OPHTHALMIC | Status: DC

## 2011-07-29 ENCOUNTER — Other Ambulatory Visit: Payer: Self-pay | Admitting: Internal Medicine

## 2011-07-29 MED ORDER — AZELASTINE HCL 0.05 % OP SOLN: 1.0000 [drp] | Freq: Two times a day (BID) | OPHTHALMIC | Status: AC

## 2011-08-19 ENCOUNTER — Other Ambulatory Visit: Payer: Self-pay | Admitting: Family Medicine

## 2011-09-15 ENCOUNTER — Other Ambulatory Visit: Payer: Self-pay | Admitting: Family Medicine

## 2011-10-21 ENCOUNTER — Ambulatory Visit (INDEPENDENT_AMBULATORY_CARE_PROVIDER_SITE_OTHER): Payer: PRIVATE HEALTH INSURANCE | Admitting: Family Medicine

## 2011-10-21 VITALS — BP 121/78 | HR 85 | Temp 98.5°F | Resp 16 | Ht 65.5 in | Wt 145.8 lb

## 2011-10-21 DIAGNOSIS — J019 Acute sinusitis, unspecified: Secondary | ICD-10-CM

## 2011-10-21 DIAGNOSIS — E785 Hyperlipidemia, unspecified: Secondary | ICD-10-CM

## 2011-10-21 DIAGNOSIS — E789 Disorder of lipoprotein metabolism, unspecified: Secondary | ICD-10-CM

## 2011-10-21 DIAGNOSIS — J3489 Other specified disorders of nose and nasal sinuses: Secondary | ICD-10-CM

## 2011-10-21 DIAGNOSIS — M549 Dorsalgia, unspecified: Secondary | ICD-10-CM

## 2011-10-21 MED ORDER — METHYLPREDNISOLONE ACETATE 80 MG/ML IJ SUSP
80.0000 mg | Freq: Once | INTRAMUSCULAR | Status: AC
  Administered 2011-10-21: 80 mg via INTRAMUSCULAR

## 2011-10-21 MED ORDER — AZITHROMYCIN 250 MG PO TABS: ORAL_TABLET | ORAL | Status: AC

## 2011-10-21 MED ORDER — CEFTRIAXONE SODIUM 1 G IJ SOLR
1.0000 g | INTRAMUSCULAR | Status: DC
  Administered 2011-10-21: 1 g via INTRAMUSCULAR

## 2011-10-21 MED ORDER — OXYCODONE-ACETAMINOPHEN 5-325 MG PO TABS: 1.0000 | ORAL_TABLET | Freq: Three times a day (TID) | ORAL | Status: DC | PRN

## 2011-10-21 NOTE — Patient Instructions (Signed)
Results for orders placed in visit on 07/05/11 CBC AND DIFFERENTIAL     Component Value Range  Hemoglobin 13.3  12.0 - 16.0 (g/dL)  HCT 40  36 - 46 (%)  Platelets 277  150 - 399 (K/L)  WBC 9.3   HM PAP SMEAR     Component Value Range  HM Pap smear normal   HM DEXA SCAN     Component Value Range  HM Dexa Scan normal   BASIC METABOLIC PANEL     Component Value Range  Glucose 100    BUN 12  4 - 21 (mg/dL)  Creatinine 0.7  0.5 - 1.1 (mg/dL)  Potassium 3.3 (*) 3.4 - 5.3 (mmol/L)  Sodium 137  137 - 147 (mmol/L) LIPID PANEL     Component Value Range  Triglycerides 386 (*) 40 - 160 (mg/dL)  Cholesterol 161 (*) 0 - 200 (mg/dL)  HDL 40  35 - 70 (mg/dL)  LDL Cholesterol 096   HEPATIC FUNCTION PANEL     Component Value Range  Alkaline Phosphatase 86  25 - 125 (U/L)  ALT 38 (*) 7 - 35 (U/L)  AST 42 (*) 13 - 35 (U/L)  Bilirubin, Total 0.8   TSH     Component Value Range  TSH 1.34  .41 - 5.90 (uIU/mL)

## 2011-10-21 NOTE — Progress Notes (Signed)
61 yo with sinus congestion x 1 week, assoc with facial pain and myalgia.  She has a long h/o sinus infections.  Going to the beach this weekend, [place] is OOT  O:  NAD Oroph:  PND TM's:  Clear Neck:  Few anterior cervical nodes, small right thyroid nodule (smaller than when last checked) Chest:  Expir. Wheeze (faint)  A: recurrent sinusitis  P: z pak, rocephin, depomedrol     Also needs cholesterol rechecked.  Has lost a couple pounds with exercise program  O:  Results for orders placed in visit on 07/05/11  CBC AND DIFFERENTIAL      Component Value Range   Hemoglobin 13.3  12.0 - 16.0 (g/dL)   HCT 40  36 - 46 (%)   Platelets 277  150 - 399 (K/L)   WBC 9.3    HM PAP SMEAR      Component Value Range   HM Pap smear normal    HM DEXA SCAN      Component Value Range   HM Dexa Scan normal    BASIC METABOLIC PANEL      Component Value Range   Glucose 100     BUN 12  4 - 21 (mg/dL)   Creatinine 0.7  0.5 - 1.1 (mg/dL)   Potassium 3.3 (*) 3.4 - 5.3 (mmol/L)   Sodium 137  137 - 147 (mmol/L)  LIPID PANEL      Component Value Range   Triglycerides 386 (*) 40 - 160 (mg/dL)   Cholesterol 419 (*) 0 - 200 (mg/dL)   HDL 40  35 - 70 (mg/dL)   LDL Cholesterol 622    HEPATIC FUNCTION PANEL      Component Value Range   Alkaline Phosphatase 86  25 - 125 (U/L)   ALT 38 (*) 7 - 35 (U/L)   AST 42 (*) 13 - 35 (U/L)   Bilirubin, Total 0.8    TSH      Component Value Range   TSH 1.34  .41 - 5.90 (uIU/mL)   A:  Hyperlipidemia, chronic  P: chk lipid profile, CMET

## 2011-10-22 ENCOUNTER — Other Ambulatory Visit: Payer: Self-pay | Admitting: Family Medicine

## 2011-10-22 LAB — COMPREHENSIVE METABOLIC PANEL
ALT: 36 U/L — ABNORMAL HIGH (ref 0–35)
AST: 38 U/L — ABNORMAL HIGH (ref 0–37)
Albumin: 4.4 g/dL (ref 3.5–5.2)
Alkaline Phosphatase: 87 U/L (ref 39–117)
BUN: 15 mg/dL (ref 6–23)
CO2: 28 mEq/L (ref 19–32)
Calcium: 9.5 mg/dL (ref 8.4–10.5)
Chloride: 94 mEq/L — ABNORMAL LOW (ref 96–112)
Creat: 0.7 mg/dL (ref 0.50–1.10)
Glucose, Bld: 226 mg/dL — ABNORMAL HIGH (ref 70–99)
Potassium: 2.9 mEq/L — ABNORMAL LOW (ref 3.5–5.3)
Sodium: 136 mEq/L (ref 135–145)
Total Bilirubin: 1.1 mg/dL (ref 0.3–1.2)
Total Protein: 7.2 g/dL (ref 6.0–8.3)

## 2011-10-22 LAB — LIPID PANEL
Cholesterol: 147 mg/dL (ref 0–200)
HDL: 40 mg/dL (ref >39)
LDL Cholesterol: 65 mg/dL (ref 0–99)
Total CHOL/HDL Ratio: 3.7 Ratio
Triglycerides: 209 mg/dL — ABNORMAL HIGH (ref <150)
VLDL: 42 mg/dL — ABNORMAL HIGH (ref 0–40)

## 2011-10-22 MED ORDER — POTASSIUM CHLORIDE CRYS ER 20 MEQ PO TBCR: 20.0000 meq | EXTENDED_RELEASE_TABLET | Freq: Every day | ORAL | Status: DC

## 2011-11-07 ENCOUNTER — Other Ambulatory Visit: Payer: Self-pay | Admitting: Family Medicine

## 2011-12-08 ENCOUNTER — Other Ambulatory Visit: Payer: Self-pay | Admitting: Family Medicine

## 2011-12-09 ENCOUNTER — Ambulatory Visit: Payer: PRIVATE HEALTH INSURANCE | Admitting: Family Medicine

## 2012-01-09 ENCOUNTER — Other Ambulatory Visit: Payer: Self-pay | Admitting: Family Medicine

## 2012-01-10 ENCOUNTER — Other Ambulatory Visit: Payer: Self-pay | Admitting: *Deleted

## 2012-03-31 ENCOUNTER — Other Ambulatory Visit: Payer: Self-pay | Admitting: Physician Assistant

## 2012-04-03 ENCOUNTER — Ambulatory Visit (INDEPENDENT_AMBULATORY_CARE_PROVIDER_SITE_OTHER): Payer: PRIVATE HEALTH INSURANCE | Admitting: Family Medicine

## 2012-04-03 VITALS — BP 146/79 | HR 95 | Temp 98.3°F | Resp 16 | Ht 65.25 in | Wt 145.2 lb

## 2012-04-03 DIAGNOSIS — F32A Depression, unspecified: Secondary | ICD-10-CM

## 2012-04-03 DIAGNOSIS — J019 Acute sinusitis, unspecified: Secondary | ICD-10-CM

## 2012-04-03 DIAGNOSIS — K219 Gastro-esophageal reflux disease without esophagitis: Secondary | ICD-10-CM

## 2012-04-03 DIAGNOSIS — I10 Essential (primary) hypertension: Secondary | ICD-10-CM

## 2012-04-03 DIAGNOSIS — J40 Bronchitis, not specified as acute or chronic: Secondary | ICD-10-CM

## 2012-04-03 DIAGNOSIS — F411 Generalized anxiety disorder: Secondary | ICD-10-CM

## 2012-04-03 DIAGNOSIS — J329 Chronic sinusitis, unspecified: Secondary | ICD-10-CM

## 2012-04-03 DIAGNOSIS — F3289 Other specified depressive episodes: Secondary | ICD-10-CM

## 2012-04-03 DIAGNOSIS — J309 Allergic rhinitis, unspecified: Secondary | ICD-10-CM

## 2012-04-03 DIAGNOSIS — F419 Anxiety disorder, unspecified: Secondary | ICD-10-CM

## 2012-04-03 DIAGNOSIS — F329 Major depressive disorder, single episode, unspecified: Secondary | ICD-10-CM

## 2012-04-03 DIAGNOSIS — M549 Dorsalgia, unspecified: Secondary | ICD-10-CM

## 2012-04-03 MED ORDER — CYCLOBENZAPRINE HCL 10 MG PO TABS: ORAL_TABLET | ORAL | Status: DC

## 2012-04-03 MED ORDER — FLUTICASONE-SALMETEROL 100-50 MCG/DOSE IN AEPB: 1.0000 | INHALATION_SPRAY | Freq: Two times a day (BID) | RESPIRATORY_TRACT | Status: DC

## 2012-04-03 MED ORDER — OXYCODONE-ACETAMINOPHEN 5-325 MG PO TABS: 1.0000 | ORAL_TABLET | Freq: Three times a day (TID) | ORAL | Status: DC | PRN

## 2012-04-03 MED ORDER — CEFTRIAXONE SODIUM 1 G IJ SOLR
1.0000 g | Freq: Once | INTRAMUSCULAR | Status: AC
  Administered 2012-04-03: 1 g via INTRAMUSCULAR

## 2012-04-03 MED ORDER — TRIAMTERENE-HCTZ 75-50 MG PO TABS: 1.0000 | ORAL_TABLET | Freq: Every day | ORAL | Status: DC

## 2012-04-03 MED ORDER — VENLAFAXINE HCL ER 75 MG PO CP24: ORAL_CAPSULE | ORAL | Status: DC

## 2012-04-03 MED ORDER — ALPRAZOLAM 0.5 MG PO TABS: 0.5000 mg | ORAL_TABLET | Freq: Three times a day (TID) | ORAL | Status: DC | PRN

## 2012-04-03 MED ORDER — LANSOPRAZOLE 30 MG PO CPDR: 30.0000 mg | DELAYED_RELEASE_CAPSULE | Freq: Every day | ORAL | Status: DC

## 2012-04-03 MED ORDER — METHYLPREDNISOLONE ACETATE 80 MG/ML IJ SUSP
80.0000 mg | Freq: Once | INTRAMUSCULAR | Status: AC
  Administered 2012-04-03: 80 mg via INTRAMUSCULAR

## 2012-04-03 MED ORDER — AZELASTINE HCL 0.1 % NA SOLN: 1.0000 | Freq: Two times a day (BID) | NASAL | Status: DC

## 2012-04-03 NOTE — Progress Notes (Signed)
@[email or web address] @   [name] MRN: [id number], DOB: [birth year], 61 y.o. Date of Encounter: 04/03/2012, 8:43 PM  Primary Physician: Elvina Sidle, MD  Chief Complaint:  Chief Complaint  Patient presents with  . Sinusitis    x 2 weeks     HPI: 61 y.o. year old female presents with 21 day history of nasal congestion, post nasal drip, sore throat, sinus pressure, and cough. Afebrile. No chills. Nasal congestion thick and green/yellow. Sinus pressure is the worst symptom. Cough is productive secondary to post nasal drip and not associated with time of day. Ears feel full, leading to sensation of muffled hearing. Has tried OTC cold preps without success. No GI complaints.   No recent antibiotics, recent travels, or sick contacts   No leg trauma, sedentary periods, h/o cancer, or tobacco use.  Patient reports that thyroid ultrasound showed involution of central nodule and no change in others at 6 months check  Past Medical History  Diagnosis Date  . Sinusitis, chronic   . Hypertension   . Hyperlipidemia   . Colon polyps   . Vitamin D deficiency   . Hiatal hernia      Home Meds: Prior to Admission medications   Medication Sig Start Date End Date Taking? Authorizing Provider  ALPRAZolam Prudy Feeler) 0.5 MG tablet take as directed 01/09/12  Yes Morrell Riddle, PA-C  aspirin 325 MG tablet Take 325 mg by mouth daily as needed.    Yes Historical Provider, MD  azelastine (OPTIVAR) 0.05 % ophthalmic solution Place 1 drop into both eyes 2 (two) times daily. 07/29/11 07/28/12 Yes Rickard Patience, PA-C  azelastine (OPTIVAR) 0.05 % ophthalmic solution place 1 drop into both eyes twice a day 03/31/12  Yes Ryan M Dunn, PA-C  cetirizine (ZYRTEC) 10 MG tablet Take 10 mg by mouth daily.   Yes Historical Provider, MD  ketoprofen (ORUDIS) 75 MG capsule Take 75 mg by mouth 3 (three) times daily as needed.   Yes Historical Provider, MD  lansoprazole (PREVACID) 30 MG capsule Take 30 mg by mouth daily.    Yes Historical Provider, MD  OMNARIS 50 MCG/ACT nasal spray instill 2 sprays into each nostril once daily 09/15/11  Yes Sarah Harvie Bridge, PA-C  oxyCODONE-acetaminophen (PERCOCET) 5-325 MG per tablet Take 1 tablet by mouth every 8 (eight) hours as needed. 10/21/11  Yes Elvina Sidle, MD  rosuvastatin (CRESTOR) 20 MG tablet Take 20 mg by mouth daily.   Yes Historical Provider, MD  triamterene-hydrochlorothiazide (MAXZIDE) 75-50 MG per tablet Take 1 tablet by mouth daily.   Yes Historical Provider, MD  venlafaxine (EFFEXOR) 75 MG tablet Take 75 mg by mouth daily.    Yes Historical Provider, MD  estradiol (CLIMARA - DOSED IN MG/24 HR) 0.05 mg/24hr Place 1 patch onto the skin once a week.    Historical Provider, MD  potassium chloride SA (K-DUR,KLOR-CON) 20 MEQ tablet Take 1 tablet (20 mEq total) by mouth daily. 10/22/11 10/21/12  Elvina Sidle, MD    Allergies:  Allergies  Allergen Reactions  . Levofloxacin     hallucinations  . Prednisone     Anxiety and paranoia    History   Social History  . Marital Status: Divorced    Spouse Name: N/A    Number of Children: N/A  . Years of Education: N/A   Occupational History  . Not on file.   Social History Main Topics  . Smoking status: Passive Smoke Exposure - Never Smoker    Types: Cigarettes  .  Smokeless tobacco: Not on file  . Alcohol Use: 1.0 oz/week    2 drink(s) per week  . Drug Use: Not on file  . Sexually Active: Not on file   Other Topics Concern  . Not on file   Social History Narrative  . No narrative on file     Review of Systems: Constitutional: negative for chills, fever, night sweats or weight changes Cardiovascular: negative for chest pain or palpitations Respiratory: negative for hemoptysis, wheezing, or shortness of breath Abdominal: negative for abdominal pain, nausea, vomiting or diarrhea Dermatological: negative for rash Neurologic: negative for headache   Physical Exam: Blood pressure 146/79, pulse 95,  temperature 98.3 F (36.8 C), temperature source Oral, resp. rate 16, height 5' 5.25" (1.657 m), weight 145 lb 3.2 oz (65.862 kg), SpO2 97.00%., Body mass index is 23.98 kg/(m^2). General: Well developed, well nourished, in no acute distress. Head: Normocephalic, atraumatic, eyes without discharge, sclera non-icteric, nares are congested. Bilateral auditory canals clear, TM's are without perforation, pearly grey with reflective cone of light bilaterally. Serous effusion bilaterally behind TM's. Maxillary sinus TTP. Oral cavity moist, dentition normal. Posterior pharynx with post nasal drip and mild erythema. No peritonsillar abscess or tonsillar exudate. Neck: Supple. No thyromegaly. Full ROM. No lymphadenopathy. Lungs: Clear bilaterally to auscultation without wheezes, rales, or rhonchi. Breathing is unlabored.  Heart: RRR with S1 S2. No murmurs, rubs, or gallops appreciated. Msk:  Strength and tone normal for age. Extremities: No clubbing or cyanosis. No edema. Neuro: Alert and oriented X 3. Moves all extremities spontaneously. CNII-XII grossly in tact. Psych:  Responds to questions appropriately with a normal affect.   Labs:   ASSESSMENT AND PLAN:  61 y.o. year old female with sinusitis 1. Sinusitis  cefTRIAXone (ROCEPHIN) injection 1 g, methylPREDNISolone acetate (DEPO-MEDROL) injection 80 mg, azelastine (ASTELIN) 137 MCG/SPRAY nasal spray, oxyCODONE-acetaminophen (PERCOCET/ROXICET) 5-325 MG per tablet  2. Allergic rhinitis  cefTRIAXone (ROCEPHIN) injection 1 g, methylPREDNISolone acetate (DEPO-MEDROL) injection 80 mg  3. Bronchitis  Fluticasone-Salmeterol (ADVAIR) 100-50 MCG/DOSE AEPB  4. GERD (gastroesophageal reflux disease)  lansoprazole (PREVACID) 30 MG capsule  5. Hypertension  triamterene-hydrochlorothiazide (MAXZIDE) 75-50 MG per tablet  6. Anxiety  ALPRAZolam (XANAX) 0.5 MG tablet    -  -Tylenol/Motrin prn -Rest/fluids -RTC precautions -RTC 3-5 days if no  improvement  Signed, Elvina Sidle, MD 04/03/2012 8:43 PM

## 2012-04-16 ENCOUNTER — Other Ambulatory Visit: Payer: Self-pay | Admitting: Family Medicine

## 2012-04-27 ENCOUNTER — Telehealth: Payer: Self-pay

## 2012-04-27 NOTE — Telephone Encounter (Signed)
Office visit note indicates return to clinic if not better.  I have called patient to advise she should come back in, may need further testing or xray.

## 2012-04-27 NOTE — Telephone Encounter (Signed)
Patient is still having sinus issues and would like a refill of antibiotic if possible.  Best [phone]

## 2012-05-25 ENCOUNTER — Other Ambulatory Visit: Payer: Self-pay | Admitting: Physician Assistant

## 2012-05-25 ENCOUNTER — Other Ambulatory Visit: Payer: Self-pay | Admitting: Family Medicine

## 2012-05-25 DIAGNOSIS — I10 Essential (primary) hypertension: Secondary | ICD-10-CM

## 2012-05-25 DIAGNOSIS — E785 Hyperlipidemia, unspecified: Secondary | ICD-10-CM

## 2012-05-25 NOTE — Telephone Encounter (Signed)
Pt has called her Pharmacy and they do not have any of the prescriptions which were sent on 04/03/12.  Please resend to Miranda Cochran as verified in Pt's chart.  [name] also would like a phone call when this has been completed.  [phone]. She also stated she did not want anyone but Dr. Milus Glazier refilling her prescriptions because she needs refills on her medications.

## 2012-05-29 NOTE — Telephone Encounter (Signed)
Called pt she has been taken care of now has the rx's

## 2012-06-02 ENCOUNTER — Ambulatory Visit (INDEPENDENT_AMBULATORY_CARE_PROVIDER_SITE_OTHER): Payer: PRIVATE HEALTH INSURANCE | Admitting: Family Medicine

## 2012-06-02 ENCOUNTER — Encounter: Payer: Self-pay | Admitting: Family Medicine

## 2012-06-02 VITALS — BP 108/72 | HR 112 | Temp 98.6°F | Resp 18 | Ht 66.5 in | Wt 147.0 lb

## 2012-06-02 DIAGNOSIS — J3489 Other specified disorders of nose and nasal sinuses: Secondary | ICD-10-CM

## 2012-06-02 DIAGNOSIS — J40 Bronchitis, not specified as acute or chronic: Secondary | ICD-10-CM

## 2012-06-02 DIAGNOSIS — R059 Cough, unspecified: Secondary | ICD-10-CM

## 2012-06-02 DIAGNOSIS — R05 Cough: Secondary | ICD-10-CM

## 2012-06-02 MED ORDER — AMOXICILLIN-POT CLAVULANATE 875-125 MG PO TABS: 1.0000 | ORAL_TABLET | Freq: Two times a day (BID) | ORAL | Status: DC

## 2012-06-02 MED ORDER — CEFTRIAXONE SODIUM 1 G IJ SOLR
1.0000 g | Freq: Once | INTRAMUSCULAR | Status: AC
  Administered 2012-06-02: 1 g via INTRAMUSCULAR

## 2012-06-02 MED ORDER — HYDROCODONE-HOMATROPINE 5-1.5 MG/5ML PO SYRP: 5.0000 mL | ORAL_SOLUTION | Freq: Three times a day (TID) | ORAL | Status: DC | PRN

## 2012-06-02 NOTE — Progress Notes (Signed)
[name] MRN: [id number], DOB: [birth year], 61 y.o. Date of Encounter: 06/02/2012, 3:59 PM  Primary Physician: Elvina Sidle, MD  Chief Complaint:  Chief Complaint  Patient presents with  . Headache  . Sinus Problem  . Cough  . Fatigue    HPI: 61 y.o. year old female presents with 2 day history of nasal congestion, post nasal drip, sore throat, sinus pressure, and cough. Afebrile. No chills. Nasal congestion thick and green/yellow. Sinus pressure is the worst symptom. Cough is productive secondary to post nasal drip and not associated with time of day. Ears feel full, leading to sensation of muffled hearing. Has tried OTC cold preps without success. No GI complaints.   No recent antibiotics, recent travels, or sick contacts   No leg trauma, sedentary periods, h/o cancer, or tobacco use.  Past Medical History  Diagnosis Date  . Sinusitis, chronic   . Hypertension   . Hyperlipidemia   . Colon polyps   . Vitamin D deficiency   . Hiatal hernia   . Allergy   . Anxiety      Home Meds: Prior to Admission medications   Medication Sig Start Date End Date Taking? Authorizing Provider  ALPRAZolam Prudy Feeler) 0.5 MG tablet Take 1 tablet (0.5 mg total) by mouth 3 (three) times daily as needed for sleep. 04/03/12  Yes Elvina Sidle, MD  azelastine (ASTELIN) 137 MCG/SPRAY nasal spray Place 1 spray into the nose 2 (two) times daily. Use in each nostril as directed 04/03/12  Yes Elvina Sidle, MD  azelastine (OPTIVAR) 0.05 % ophthalmic solution Place 1 drop into both eyes 2 (two) times daily. 07/29/11 07/28/12 Yes Rickard Patience, PA-C  azelastine (OPTIVAR) 0.05 % ophthalmic solution place 1 drop into both eyes twice a day 03/31/12  Yes Ryan M Dunn, PA-C  cetirizine (ZYRTEC) 10 MG tablet Take 10 mg by mouth daily.   Yes Historical Provider, MD  CRESTOR 20 MG tablet take 1 tablet by mouth once daily 05/25/12  Yes Elvina Sidle, MD  Fluticasone-Salmeterol (ADVAIR) 100-50  MCG/DOSE AEPB Inhale 1 puff into the lungs 2 (two) times daily. 04/03/12  Yes Elvina Sidle, MD  lansoprazole (PREVACID) 30 MG capsule Take 1 capsule (30 mg total) by mouth daily. 04/03/12  Yes Elvina Sidle, MD  OMNARIS 50 MCG/ACT nasal spray instill 2 sprays into each nostril once daily 09/15/11  Yes Sarah Harvie Bridge, PA-C  triamterene-hydrochlorothiazide [facility]) 75-50 MG per tablet take 1 tablet by mouth once daily 05/25/12  Yes Elvina Sidle, MD  venlafaxine XR (EFFEXOR XR) 75 MG 24 hr capsule Two daily 04/03/12  Yes Elvina Sidle, MD  aspirin 325 MG tablet Take 325 mg by mouth daily as needed.     Historical Provider, MD  cyclobenzaprine (FLEXERIL) 10 MG tablet Prn hs 04/03/12   Elvina Sidle, MD  estradiol (CLIMARA - DOSED IN MG/24 HR) 0.05 mg/24hr Place 1 patch onto the skin once a week.    Historical Provider, MD  ketoprofen (ORUDIS) 75 MG capsule TAKE 1 CAPSULE BY MOUTH ONCE DAILY IF NEEDED 05/25/12   Nelva Nay, PA-C  oxyCODONE-acetaminophen (PERCOCET/ROXICET) 5-325 MG per tablet Take 1 tablet by mouth every 8 (eight) hours as needed. 04/03/12   Elvina Sidle, MD  potassium chloride SA (K-DUR,KLOR-CON) 20 MEQ tablet Take 1 tablet (20 mEq total) by mouth daily. 10/22/11 10/21/12  Elvina Sidle, MD    Allergies:  Allergies  Allergen Reactions  . Levofloxacin     hallucinations  . Prednisone  Anxiety and paranoia    History   Social History  . Marital Status: Divorced    Spouse Name: N/A    Number of Children: N/A  . Years of Education: N/A   Occupational History  . Not on file.   Social History Main Topics  . Smoking status: Passive Smoke Exposure - Never Smoker    Types: Cigarettes  . Smokeless tobacco: Not on file  . Alcohol Use: 1.0 oz/week    2 drink(s) per week  . Drug Use: Not on file  . Sexually Active: Not on file   Other Topics Concern  . Not on file   Social History Narrative  . No narrative on file     Review of  Systems: Constitutional: negative for chills, fever, night sweats or weight changes Cardiovascular: negative for chest pain or palpitations Respiratory: negative for hemoptysis, wheezing, or shortness of breath Abdominal: negative for abdominal pain, nausea, vomiting or diarrhea Dermatological: negative for rash Neurologic: negative for headache   Physical Exam: Blood pressure 108/72, pulse 112, temperature 98.6 F (37 C), temperature source Oral, resp. rate 18, height 5' 6.5" (1.689 m), weight 147 lb (66.679 kg), SpO2 96.00%., Body mass index is 23.37 kg/(m^2). General: Well developed, well nourished, in no acute distress. Head: Normocephalic, atraumatic, eyes without discharge, sclera non-icteric, nares are congested. Bilateral auditory canals clear, TM's are without perforation, pearly grey with reflective cone of light bilaterally. Serous effusion bilaterally behind TM's. Maxillary sinus TTP. Oral cavity moist, dentition normal. Posterior pharynx with post nasal drip and mild erythema. No peritonsillar abscess or tonsillar exudate. Neck: Supple. No thyromegaly. Full ROM. No lymphadenopathy. Lungs: Clear bilaterally to auscultation without wheezes, rales, or rhonchi. Breathing is unlabored.  Heart: RRR with S1 S2. No murmurs, rubs, or gallops appreciated. Msk:  Strength and tone normal for age. Extremities: No clubbing or cyanosis. No edema. Neuro: Alert and oriented X 3. Moves all extremities spontaneously. CNII-XII grossly in tact. Psych:  Responds to questions appropriately with a normal affect.    ASSESSMENT AND PLAN:  61 y.o. year old female with sinusitis and bronchitis -  -Tylenol/Motrin prn -Rest/fluids -RTC precautions -RTC 3-5 days if no improvement  Signed, Elvina Sidle, MD 06/02/2012 3:59 PM

## 2012-06-02 NOTE — Patient Instructions (Addendum)

## 2012-06-05 ENCOUNTER — Telehealth: Payer: Self-pay

## 2012-06-05 MED ORDER — AMOXICILLIN 875 MG PO TABS: 875.0000 mg | ORAL_TABLET | Freq: Two times a day (BID) | ORAL | Status: DC

## 2012-06-05 NOTE — Telephone Encounter (Signed)
The patient called to report that after taking the Augmentin she was rx'd on Friday 06/02/12, she is experiencing a lot of  abdominal pain and diarrhea.  The patient would like a different medication such as penicillin or a Z-pack.  Please call the patient at [phone].

## 2012-06-05 NOTE — Telephone Encounter (Signed)
Stop the Augmentin, I have sent amoxicillin to the pharmacy.  Take this food to reduce stomach upset; an over the counter probiotic may help as well

## 2012-06-05 NOTE — Telephone Encounter (Signed)
Patient notified and voiced understanding.

## 2012-06-09 ENCOUNTER — Telehealth: Payer: Self-pay

## 2012-06-09 MED ORDER — AZITHROMYCIN 250 MG PO TABS: ORAL_TABLET | ORAL | Status: DC

## 2012-06-09 NOTE — Telephone Encounter (Signed)
Rx sent: Meds ordered this encounter  Medications  . azithromycin (ZITHROMAX) 250 MG tablet    Sig: Take 2 tabs PO x 1 dose, then 1 tab PO QD x 4 days    Dispense:  6 tablet    Refill:  0    Order Specific Question:  Supervising Provider    Answer:  DOOLITTLE, ROBERT P [[id number]]

## 2012-06-09 NOTE — Telephone Encounter (Signed)
Pt states that the medication that she was prescribed is causing her to have diarrhea, she still complains of headache, congestion, and coughing. Pt states that the rx Z-pac works best for her. Best# [phone] Pharmacy: [facility]

## 2012-06-09 NOTE — Telephone Encounter (Signed)
We changed to Amox last week b/c the augmentin caused gi upset also

## 2012-06-10 NOTE — Telephone Encounter (Signed)
LMOM RX sent 

## 2012-07-03 ENCOUNTER — Other Ambulatory Visit: Payer: Self-pay | Admitting: Family Medicine

## 2012-07-07 ENCOUNTER — Telehealth: Payer: Self-pay

## 2012-07-07 NOTE — Telephone Encounter (Signed)
Patient requests that ONLY DR. Milus Glazier sees this message:   Requesting all of her scripts be rewritten for NAME BRAND ONLY and sent to [facility] at [address]   Call her  When done [phone]

## 2012-08-03 ENCOUNTER — Telehealth: Payer: Self-pay

## 2012-08-03 MED ORDER — PREVACID 30 MG PO CPDR: 30.0000 mg | DELAYED_RELEASE_CAPSULE | Freq: Every day | ORAL | Status: DC

## 2012-08-03 MED ORDER — EFFEXOR XR 75 MG PO CP24: 150.0000 mg | ORAL_CAPSULE | Freq: Every day | ORAL | Status: DC

## 2012-08-03 MED ORDER — MAXZIDE 75-50 MG PO TABS: 1.0000 | ORAL_TABLET | Freq: Every day | ORAL | Status: DC

## 2012-08-03 NOTE — Telephone Encounter (Signed)
Requesting all of her scripts be rewritten for generic brand (name brand costs too much)  and sent to [facility] at [address]. Mainly  wants to have afexor Xr refilled as soon as possible Call her When done [phone]. She says shes put in this requests last month when dr. Elbert Ewings was still here. She was wondering if another provider can refill these or at least the afexor xr.

## 2012-08-03 NOTE — Telephone Encounter (Signed)
Pt stated that she does NOT want the generics. She requests the NAME BRAND ONLY for effexor, prevacid and maxzide. I will RF these for pt per protocol. Pt stated she can wait for her other Rxs until after Dr L returns.

## 2012-08-19 ENCOUNTER — Ambulatory Visit (INDEPENDENT_AMBULATORY_CARE_PROVIDER_SITE_OTHER): Payer: No Typology Code available for payment source | Admitting: Family Medicine

## 2012-08-19 VITALS — BP 122/82 | HR 101 | Temp 97.8°F | Resp 16 | Ht 65.5 in | Wt 143.0 lb

## 2012-08-19 DIAGNOSIS — J329 Chronic sinusitis, unspecified: Secondary | ICD-10-CM

## 2012-08-19 MED ORDER — AZITHROMYCIN 250 MG PO TABS: ORAL_TABLET | ORAL | Status: DC

## 2012-08-19 MED ORDER — MONTELUKAST SODIUM 10 MG PO TABS: 10.0000 mg | ORAL_TABLET | Freq: Every day | ORAL | Status: DC

## 2012-08-19 MED ORDER — METHYLPREDNISOLONE ACETATE 80 MG/ML IJ SUSP
80.0000 mg | Freq: Once | INTRAMUSCULAR | Status: AC
  Administered 2012-08-19: 80 mg via INTRAMUSCULAR

## 2012-08-19 MED ORDER — CICLESONIDE 50 MCG/ACT NA SUSP: NASAL | Status: DC

## 2012-08-19 NOTE — Progress Notes (Signed)
Subjective:    [name], female    DOB: 07-16-1950, 62 y.o.   MRN: [id number]  HPI  Currently ill x 5d - started getting worse 3d ago and has had a severe sinus HA - which might have been triggered by severe chemical smells at work.  She has a lot of environmental allergies and unfortunately, her sinus flair then develops infection not infrequently.  Has been doing mucinex DM, zyrtec -D, took some asa this a.m. which helped a little.  Has not have any f/c but very lethargic and some body aches but biggest thing is head, face, jaw pressure, and pnd so thick it is choking her.  No ear sxs, no cough, no pharyngitis.   Past Medical History  Diagnosis Date  . Sinusitis, chronic   . Hypertension   . Hyperlipidemia   . Colon polyps   . Vitamin D deficiency   . Hiatal hernia   . Allergy   . Anxiety   . Asthma   . Depression   . GERD (gastroesophageal reflux disease)    Current Outpatient Prescriptions on File Prior to Visit  Medication Sig Dispense Refill  . ALPRAZolam (XANAX) 0.5 MG tablet Take 1 tablet (0.5 mg total) by mouth 3 (three) times daily as needed for sleep.  90 tablet  5  . aspirin 325 MG tablet Take 325 mg by mouth daily as needed.       Marland Kitchen azelastine (ASTELIN) 137 MCG/SPRAY nasal spray Place 1 spray into the nose 2 (two) times daily. Use in each nostril as directed  30 mL  12  . azelastine (OPTIVAR) 0.05 % ophthalmic solution place 1 drop into both eyes twice a day  6 mL  2  . cetirizine (ZYRTEC) 10 MG tablet Take 10 mg by mouth daily.      . CRESTOR 20 MG tablet take 1 tablet by mouth once daily  90 each  PRN  . cyclobenzaprine (FLEXERIL) 10 MG tablet Prn hs  30 tablet  0  . EFFEXOR XR 75 MG 24 hr capsule Take 2 capsules (150 mg total) by mouth daily.  180 capsule  0  . Fluticasone-Salmeterol (ADVAIR) 100-50 MCG/DOSE AEPB Inhale 1 puff into the lungs 2 (two) times daily.  1 each  3  . ketoprofen (ORUDIS) 75 MG capsule TAKE 1 CAPSULE BY MOUTH ONCE DAILY IF  NEEDED  30 capsule  0  . lansoprazole (PREVACID) 30 MG capsule Take 1 capsule (30 mg total) by mouth daily.  90 capsule  3  . MAXZIDE 75-50 MG per tablet Take 1 tablet by mouth daily.  90 tablet  0  . OMNARIS 50 MCG/ACT nasal spray instill 2 sprays into each nostril once daily  12.5 g  10  . oxyCODONE-acetaminophen (PERCOCET/ROXICET) 5-325 MG per tablet Take 1 tablet by mouth every 8 (eight) hours as needed.  30 tablet  0  . azithromycin (ZITHROMAX) 250 MG tablet Take 2 tabs PO x 1 dose, then 1 tab PO QD x 4 days  6 tablet  0  . estradiol (CLIMARA - DOSED IN MG/24 HR) 0.05 mg/24hr Place 1 patch onto the skin once a week.      Marland Kitchen HYDROcodone-homatropine (HYCODAN) 5-1.5 MG/5ML syrup Take 5 mLs by mouth every 8 (eight) hours as needed for cough.  120 mL  0  . potassium chloride SA (K-DUR,KLOR-CON) 20 MEQ tablet Take 1 tablet (20 mEq total) by mouth daily.  30 tablet  0  . PREVACID 30  MG capsule Take 1 capsule (30 mg total) by mouth daily.  90 capsule  0  . triamterene-hydrochlorothiazide (MAXZIDE) 75-50 MG per tablet take 1 tablet by mouth once daily  90 tablet  3  . venlafaxine XR (EFFEXOR XR) 75 MG 24 hr capsule Two daily  180 capsule  3   Current Facility-Administered Medications on File Prior to Visit  Medication Dose Route Frequency Provider Last Rate Last Dose  . cefTRIAXone (ROCEPHIN) injection 1 g  1 g Intramuscular Q24H Elvina Sidle, MD   1 g at 10/21/11 1336   Allergies  Allergen Reactions  . Levofloxacin     hallucinations  . Prednisone     Anxiety and paranoia  . Amoxicillin Other (See Comments)    diarrhea  . Augmentin (Amoxicillin-Pot Clavulanate) Other (See Comments)    diarrhea    Review of Systems  Constitutional: Positive for activity change, appetite change and fatigue. Negative for fever, chills and diaphoresis.  HENT: Positive for congestion, rhinorrhea, sneezing, postnasal drip and sinus pressure. Negative for ear pain, nosebleeds, sore throat, trouble swallowing,  neck pain, neck stiffness, voice change and ear discharge.   Eyes: Negative for discharge and itching.  Respiratory: Positive for choking. Negative for cough and shortness of breath.   Cardiovascular: Negative for chest pain.  Gastrointestinal: Negative for nausea, vomiting and abdominal pain.  Musculoskeletal: Positive for myalgias and back pain. Negative for joint swelling and gait problem.  Skin: Negative for rash.  Allergic/Immunologic: Positive for environmental allergies. Negative for immunocompromised state.  Neurological: Positive for headaches. Negative for dizziness and syncope.  Hematological: Positive for adenopathy.  Psychiatric/Behavioral: Positive for sleep disturbance.      BP 122/82  Pulse 101  Temp(Src) 97.8 F (36.6 C) (Oral)  Resp 16  Ht 5' 5.5" (1.664 m)  Wt 143 lb (64.864 kg)  BMI 23.43 kg/m2  SpO2 98% Objective:   Physical Exam  Constitutional: She is oriented to person, place, and time. She appears well-developed and well-nourished. She is active. She does not appear ill. No distress.  HENT:  Head: Normocephalic and atraumatic.  Right Ear: External ear and ear canal normal. Tympanic membrane is retracted. A middle ear effusion is present.  Left Ear: External ear and ear canal normal. Tympanic membrane is retracted. A middle ear effusion is present.  Nose: Mucosal edema and rhinorrhea present. Right sinus exhibits maxillary sinus tenderness. Left sinus exhibits maxillary sinus tenderness.  Mouth/Throat: Uvula is midline and mucous membranes are normal. Posterior oropharyngeal erythema present. No oropharyngeal exudate, posterior oropharyngeal edema or tonsillar abscesses.  Eyes: Conjunctivae are normal. Right eye exhibits no discharge. Left eye exhibits no discharge. No scleral icterus.  Neck: Normal range of motion. Neck supple.  Cardiovascular: Normal rate, regular rhythm, normal heart sounds and intact distal pulses.   Pulmonary/Chest: Effort normal and  breath sounds normal.  Lymphadenopathy:       Head (right side): Submandibular adenopathy present. No preauricular and no posterior auricular adenopathy present.       Head (left side): Submandibular adenopathy present. No preauricular and no posterior auricular adenopathy present.    She has no cervical adenopathy.       Right: No supraclavicular adenopathy present.       Left: No supraclavicular adenopathy present.  Neurological: She is alert and oriented to person, place, and time.  Skin: Skin is warm and dry. She is not diaphoretic. No erythema.  Psychiatric: She has a normal mood and affect. Her behavior is normal.  Assessment & Plan:  Sinusitis, chronic - Plan: methylPREDNISolone acetate (DEPO-MEDROL) injection 80 mg - pt states that her prednisone is not a allergy - just an intolerance - and she always does very well with depomedrol inj and a zpack so will repeat.  Refilled chronic meds of singulair and omnaris.  Meds ordered this encounter  Medications  . azithromycin (ZITHROMAX) 250 MG tablet    Sig: Take 2 tabs PO x 1 dose, then 1 tab PO QD x 4 days    Dispense:  6 tablet    Refill:  0    Order Specific Question:  Supervising Provider    Answer:  DOOLITTLE, ROBERT P [[id number]]  . ciclesonide (OMNARIS) 50 MCG/ACT nasal spray    Sig: instill 2 sprays into each nostril once daily    Dispense:  12.5 g    Refill:  10  . montelukast (SINGULAIR) 10 MG tablet    Sig: Take 1 tablet (10 mg total) by mouth at bedtime.    Dispense:  30 tablet    Refill:  3  . methylPREDNISolone acetate (DEPO-MEDROL) injection 80 mg    Sig:

## 2012-08-19 NOTE — Patient Instructions (Addendum)

## 2012-08-25 NOTE — Progress Notes (Signed)
Prior auth approved for Omnaris from 07/26/12 - 08/25/13. Notified pharmacy

## 2012-10-02 ENCOUNTER — Other Ambulatory Visit: Payer: Self-pay | Admitting: Family Medicine

## 2012-10-03 NOTE — Telephone Encounter (Signed)
Dr L, do you want to RF? 

## 2012-10-04 ENCOUNTER — Ambulatory Visit (INDEPENDENT_AMBULATORY_CARE_PROVIDER_SITE_OTHER): Payer: No Typology Code available for payment source | Admitting: Family Medicine

## 2012-10-04 ENCOUNTER — Telehealth: Payer: Self-pay

## 2012-10-04 VITALS — BP 128/84 | HR 107 | Temp 99.0°F | Resp 16 | Ht 65.5 in | Wt 145.0 lb

## 2012-10-04 DIAGNOSIS — R002 Palpitations: Secondary | ICD-10-CM

## 2012-10-04 DIAGNOSIS — F419 Anxiety disorder, unspecified: Secondary | ICD-10-CM

## 2012-10-04 DIAGNOSIS — J329 Chronic sinusitis, unspecified: Secondary | ICD-10-CM

## 2012-10-04 DIAGNOSIS — E041 Nontoxic single thyroid nodule: Secondary | ICD-10-CM

## 2012-10-04 DIAGNOSIS — N959 Unspecified menopausal and perimenopausal disorder: Secondary | ICD-10-CM

## 2012-10-04 DIAGNOSIS — F411 Generalized anxiety disorder: Secondary | ICD-10-CM

## 2012-10-04 DIAGNOSIS — G43909 Migraine, unspecified, not intractable, without status migrainosus: Secondary | ICD-10-CM

## 2012-10-04 DIAGNOSIS — Z780 Asymptomatic menopausal state: Secondary | ICD-10-CM

## 2012-10-04 LAB — POCT CBC
Granulocyte percent: 53.3 %G (ref 37–80)
HCT, POC: 45.9 % (ref 37.7–47.9)
Hemoglobin: 15.3 g/dL (ref 12.2–16.2)
Lymph, poc: 4.8 — AB (ref 0.6–3.4)
MCH, POC: 32.7 pg — AB (ref 27–31.2)
MCHC: 33.3 g/dL (ref 31.8–35.4)
MCV: 98 fL — AB (ref 80–97)
MID (cbc): 0.9 (ref 0–0.9)
MPV: 9.5 fL (ref 0–99.8)
POC Granulocyte: 6.5 (ref 2–6.9)
POC LYMPH PERCENT: 39.1 %L (ref 10–50)
POC MID %: 7.6 %M (ref 0–12)
Platelet Count, POC: 324 10*3/uL (ref 142–424)
RBC: 4.68 M/uL (ref 4.04–5.48)
RDW, POC: 13.2 %
WBC: 12.2 10*3/uL — AB (ref 4.6–10.2)

## 2012-10-04 MED ORDER — ESTRADIOL 0.05 MG/24HR TD PTWK: 1.0000 | MEDICATED_PATCH | TRANSDERMAL | Status: DC

## 2012-10-04 MED ORDER — KETOPROFEN 75 MG PO CAPS: 75.0000 mg | ORAL_CAPSULE | Freq: Three times a day (TID) | ORAL | Status: DC | PRN

## 2012-10-04 MED ORDER — OXYCODONE-ACETAMINOPHEN 5-325 MG PO TABS: 1.0000 | ORAL_TABLET | Freq: Three times a day (TID) | ORAL | Status: DC | PRN

## 2012-10-04 MED ORDER — MONTELUKAST SODIUM 10 MG PO TABS: 10.0000 mg | ORAL_TABLET | Freq: Every day | ORAL | Status: DC

## 2012-10-04 MED ORDER — AZITHROMYCIN 250 MG PO TABS: ORAL_TABLET | ORAL | Status: DC

## 2012-10-04 MED ORDER — ALPRAZOLAM 0.5 MG PO TABS: 0.5000 mg | ORAL_TABLET | Freq: Three times a day (TID) | ORAL | Status: DC | PRN

## 2012-10-04 NOTE — Progress Notes (Signed)
[name] MRN: [id number], DOB: [birth year], 62 y.o. Date of Encounter: 10/04/2012, 9:07 PM  Primary Physician: Elvina Sidle, MD  Chief Complaint:  Chief Complaint  Patient presents with  . Sinusitis    * 2 weeks  . Medication Refill    Ketoprofen, Oxycodone, Estradiol Patch, Xanax  . Palpitations    feels heart pounding from time to time    HPI: 62 y.o. year old female presents with 14 day history of nasal congestion, post nasal drip, sore throat, sinus pressure, and cough. Afebrile. No chills. Nasal congestion thick and green/yellow. Sinus pressure is the worst symptom. Cough is productive secondary to post nasal drip and not associated with time of day. Ears feel full, leading to sensation of muffled hearing. Has tried OTC cold preps without success. No GI complaints.   No recent antibiotics, recent travels, or sick contacts   No leg trauma, sedentary periods, h/o cancer, or tobacco use.  Past Medical History  Diagnosis Date  . Sinusitis, chronic   . Hypertension   . Hyperlipidemia   . Colon polyps   . Vitamin D deficiency   . Hiatal hernia   . Allergy   . Anxiety   . Asthma   . Depression   . GERD (gastroesophageal reflux disease)      Home Meds: Prior to Admission medications   Medication Sig Start Date End Date Taking? Authorizing Provider  ALPRAZolam Prudy Feeler) 0.5 MG tablet Take 1 tablet (0.5 mg total) by mouth 3 (three) times daily as needed for sleep. 10/04/12  Yes Elvina Sidle, MD  aspirin 325 MG tablet Take 325 mg by mouth daily as needed.    Yes Historical Provider, MD  azelastine (OPTIVAR) 0.05 % ophthalmic solution place 1 drop into both eyes twice a day 03/31/12  Yes Ryan M Dunn, PA-C  cetirizine (ZYRTEC) 10 MG tablet Take 10 mg by mouth daily.   Yes Historical Provider, MD  ciclesonide (OMNARIS) 50 MCG/ACT nasal spray instill 2 sprays into each nostril once daily 08/19/12  Yes Sherren Mocha, MD  CRESTOR 20 MG tablet take 1 tablet by  mouth once daily 05/25/12  Yes Elvina Sidle, MD  cyclobenzaprine (FLEXERIL) 10 MG tablet Prn hs 04/03/12  Yes Elvina Sidle, MD  EFFEXOR XR 75 MG 24 hr capsule Take 2 capsules (150 mg total) by mouth daily. 08/03/12  Yes Elvina Sidle, MD  Fluticasone-Salmeterol (ADVAIR) 100-50 MCG/DOSE AEPB Inhale 1 puff into the lungs 2 (two) times daily. 04/03/12  Yes Elvina Sidle, MD  ketoprofen (ORUDIS) 75 MG capsule Take 1 capsule (75 mg total) by mouth 3 (three) times daily as needed for pain. 10/04/12  Yes Elvina Sidle, MD  lansoprazole (PREVACID) 30 MG capsule Take 1 capsule (30 mg total) by mouth daily. 04/03/12  Yes Elvina Sidle, MD  MAXZIDE 75-50 MG per tablet Take 1 tablet by mouth daily. 08/03/12  Yes Elvina Sidle, MD  montelukast (SINGULAIR) 10 MG tablet Take 1 tablet (10 mg total) by mouth at bedtime. 10/04/12  Yes Elvina Sidle, MD  oxyCODONE-acetaminophen (PERCOCET/ROXICET) 5-325 MG per tablet Take 1 tablet by mouth every 8 (eight) hours as needed. 10/04/12  Yes Elvina Sidle, MD  azithromycin (ZITHROMAX) 250 MG tablet Take 2 tabs PO x 1 dose, then 1 tab PO QD x 4 days 10/04/12   Elvina Sidle, MD  estradiol (CLIMARA - DOSED IN MG/24 HR) 0.05 mg/24hr Place 1 patch (0.05 mg total) onto the skin once a week. 10/04/12   Elvina Sidle,  MD  potassium chloride SA (K-DUR,KLOR-CON) 20 MEQ tablet Take 1 tablet (20 mEq total) by mouth daily. 10/22/11 10/21/12  Elvina Sidle, MD  PREVACID 30 MG capsule Take 1 capsule (30 mg total) by mouth daily. 08/03/12   Elvina Sidle, MD  triamterene-hydrochlorothiazide [facility]) 75-50 MG per tablet take 1 tablet by mouth once daily 05/25/12   Elvina Sidle, MD  venlafaxine XR (EFFEXOR XR) 75 MG 24 hr capsule Two daily 04/03/12   Elvina Sidle, MD    Allergies:  Allergies  Allergen Reactions  . Levofloxacin     hallucinations  . Prednisone     Anxiety and paranoia  . Amoxicillin Other (See Comments)    diarrhea  . Augmentin  (Amoxicillin-Pot Clavulanate) Other (See Comments)    diarrhea    History   Social History  . Marital Status: Divorced    Spouse Name: N/A    Number of Children: N/A  . Years of Education: N/A   Occupational History  . Not on file.   Social History Main Topics  . Smoking status: Passive Smoke Exposure - Never Smoker    Types: Cigarettes  . Smokeless tobacco: Not on file  . Alcohol Use: 1.0 oz/week    2 drink(s) per week     Comment: 1-3/week  . Drug Use: No  . Sexually Active: Yes   Other Topics Concern  . Not on file   Social History Narrative  . No narrative on file     Review of Systems: Constitutional: negative for chills, fever, night sweats or weight changes Cardiovascular: negative for chest pain Respiratory: negative for hemoptysis, wheezing, or shortness of breath Abdominal: negative for abdominal pain, nausea, vomiting or diarrhea Dermatological: negative for rash Neurologic: negative for headache   Physical Exam: Blood pressure 128/84, pulse 107, temperature 99 F (37.2 C), temperature source Oral, resp. rate 16, height 5' 5.5" (1.664 m), weight 145 lb (65.772 kg), SpO2 97.00%., Body mass index is 23.75 kg/(m^2). General: Well developed, well nourished, in no acute distress. Head: Normocephalic, atraumatic, eyes without discharge, sclera non-icteric, nares are congested. Bilateral auditory canals clear, TM's are without perforation, pearly grey with reflective cone of light bilaterally. Serous effusion bilaterally behind TM's. Maxillary sinus TTP. Oral cavity moist, dentition normal. Posterior pharynx with post nasal drip and mild erythema. No peritonsillar abscess or tonsillar exudate. Neck: Supple. 3-4 mm midline thyroid nodule Full ROM. No lymphadenopathy. Lungs: Clear bilaterally to auscultation without wheezes, rales, or rhonchi. Breathing is unlabored.  Heart: RRR with S1 S2. No murmurs, rubs, or gallops appreciated. Msk:  Strength and tone normal for  age. Extremities: No clubbing or cyanosis. No edema. Neuro: Alert and oriented X 3. Moves all extremities spontaneously. CNII-XII grossly in tact. Psych:  Responds to questions appropriately with a normal affect.     ASSESSMENT AND PLAN:  62 y.o. year old female with sinusitis, palpitations, left facial pain, postmenopausal problems, seasonal allergies Palpitations - Plan: EKG 12-Lead  Sinusitis - Plan: montelukast (SINGULAIR) 10 MG tablet, oxyCODONE-acetaminophen (PERCOCET/ROXICET) 5-325 MG per tablet, azithromycin (ZITHROMAX) 250 MG tablet  Anxiety - Plan: ALPRAZolam (XANAX) 0.5 MG tablet  Migraine - Plan: ketoprofen (ORUDIS) 75 MG capsule  Post menopausal problems - Plan: estradiol (CLIMARA - DOSED IN MG/24 HR) 0.05 mg/24hr  -Tylenol/Motrin prn -Rest/fluids -RTC precautions -RTC 3-5 days if no improvement We also discussed the thyroid nodule which has been biopsied twice. First time was 2 years ago and the biopsy showed some atypical cells. The second biopsy 6 months later  showed indeterminate and biopsy result. Patient's noted no increase in the size of the nodule and a followup ultrasound did show resolution of one of the nodules previously seen. For this reason I am hesitant to recommend further biopsies. At this point, we will perform serial physical exams and get a followup ultrasound in the next 3 months Signed, Elvina Sidle, MD 10/04/2012 9:07 PM

## 2012-10-04 NOTE — Telephone Encounter (Signed)
Pt is calling about her prescription she said that her pharmacy faxed over a RX refill request on Monday and she was calling to check on her prescription Her pharmacy is [facility] on [address].  Call back number is [phone]

## 2012-10-04 NOTE — Telephone Encounter (Signed)
Dr L, pt is calling to check on her alprazolam RF, which should be in your Rx Refill inbasket.

## 2012-10-05 ENCOUNTER — Other Ambulatory Visit: Payer: Self-pay | Admitting: Family Medicine

## 2012-10-05 ENCOUNTER — Other Ambulatory Visit: Payer: Self-pay | Admitting: Radiology

## 2012-10-05 DIAGNOSIS — E876 Hypokalemia: Secondary | ICD-10-CM

## 2012-10-05 LAB — COMPREHENSIVE METABOLIC PANEL
ALT: 52 U/L — ABNORMAL HIGH (ref 0–35)
AST: 43 U/L — ABNORMAL HIGH (ref 0–37)
Albumin: 4.7 g/dL (ref 3.5–5.2)
Alkaline Phosphatase: 112 U/L (ref 39–117)
BUN: 13 mg/dL (ref 6–23)
CO2: 32 mEq/L (ref 19–32)
Calcium: 10.1 mg/dL (ref 8.4–10.5)
Chloride: 92 mEq/L — ABNORMAL LOW (ref 96–112)
Creat: 0.77 mg/dL (ref 0.50–1.10)
Glucose, Bld: 133 mg/dL — ABNORMAL HIGH (ref 70–99)
Potassium: 3.1 mEq/L — ABNORMAL LOW (ref 3.5–5.3)
Sodium: 136 mEq/L (ref 135–145)
Total Bilirubin: 0.8 mg/dL (ref 0.3–1.2)
Total Protein: 8.4 g/dL — ABNORMAL HIGH (ref 6.0–8.3)

## 2012-10-05 LAB — LIPID PANEL
Cholesterol: 218 mg/dL — ABNORMAL HIGH (ref 0–200)
HDL: 60 mg/dL (ref >39)
LDL Cholesterol: 117 mg/dL — ABNORMAL HIGH (ref 0–99)
Total CHOL/HDL Ratio: 3.6 Ratio
Triglycerides: 207 mg/dL — ABNORMAL HIGH (ref <150)
VLDL: 41 mg/dL — ABNORMAL HIGH (ref 0–40)

## 2012-10-05 LAB — TSH: TSH: 1.599 u[IU]/mL (ref 0.350–4.500)

## 2012-10-05 MED ORDER — POTASSIUM CHLORIDE ER 10 MEQ PO TBCR: 10.0000 meq | EXTENDED_RELEASE_TABLET | Freq: Two times a day (BID) | ORAL | Status: DC

## 2012-10-06 ENCOUNTER — Other Ambulatory Visit: Payer: Self-pay | Admitting: Radiology

## 2012-10-09 ENCOUNTER — Telehealth: Payer: Self-pay

## 2012-10-09 NOTE — Telephone Encounter (Signed)
Received fax from [employer] asking [place] to authorize change of Effexor to generic. Called pt to clarify if name brand is needed. Pt stated that she had Dr L write for name brand only because it has been working well for years and she does not want to change to generic. I sent form back denying change to generic.

## 2012-10-30 ENCOUNTER — Other Ambulatory Visit: Payer: Self-pay | Admitting: Family Medicine

## 2012-10-30 DIAGNOSIS — E876 Hypokalemia: Secondary | ICD-10-CM

## 2012-10-30 MED ORDER — POTASSIUM CHLORIDE ER 10 MEQ PO TBCR: 20.0000 meq | EXTENDED_RELEASE_TABLET | Freq: Two times a day (BID) | ORAL | Status: DC

## 2012-11-01 ENCOUNTER — Encounter: Payer: Self-pay | Admitting: Family Medicine

## 2012-11-01 ENCOUNTER — Other Ambulatory Visit: Payer: Self-pay | Admitting: Family Medicine

## 2012-11-01 DIAGNOSIS — E876 Hypokalemia: Secondary | ICD-10-CM

## 2012-11-01 MED ORDER — POTASSIUM CHLORIDE ER 10 MEQ PO TBCR: 20.0000 meq | EXTENDED_RELEASE_TABLET | Freq: Two times a day (BID) | ORAL | Status: DC

## 2012-11-04 ENCOUNTER — Other Ambulatory Visit: Payer: Self-pay | Admitting: Family Medicine

## 2012-11-04 ENCOUNTER — Telehealth: Payer: Self-pay

## 2012-11-04 DIAGNOSIS — E876 Hypokalemia: Secondary | ICD-10-CM

## 2012-11-04 NOTE — Telephone Encounter (Signed)
Message copied by Johnnette Litter on Sat Nov 04, 2012  8:47 AM ------      Message from: Elvina Sidle      Created: Fri Nov 03, 2012  7:25 AM       Patient has abnormal lab values.  Cholesterol is okay and thyroid is ok.  We need a follow up on the potassium level and liver test (CMET).  Please set this up ------

## 2012-11-04 NOTE — Telephone Encounter (Signed)
Dr. Elbert Ewings, please review all of the messages that we wrote on her labs. This is one of them: Spoke with patient about labs. She had some questions about potassium being low. What could be the cause of this? She would like you to call in the smaller potassium pill that she would take two a day because she cant swallow the bigger one. Does she need to go back to [facility] for her ultrasound of thyroid?

## 2012-11-04 NOTE — Telephone Encounter (Signed)
I called in the smaller potassium pill.  I do not understand why her potassium continues to be low and so I referred her to nephrologist.  She does need to follow up on thyroid at [facility]

## 2012-11-06 NOTE — Telephone Encounter (Signed)
Called her. Left message for her to call me back.  

## 2012-11-08 NOTE — Telephone Encounter (Signed)
Called again. Her phone cut out, she could not hear me. Will try again later.

## 2012-11-09 NOTE — Telephone Encounter (Signed)
Unable to reach, letter sent

## 2012-11-11 ENCOUNTER — Other Ambulatory Visit: Payer: Self-pay | Admitting: Physician Assistant

## 2012-11-14 ENCOUNTER — Other Ambulatory Visit: Payer: Self-pay | Admitting: Radiology

## 2012-11-14 NOTE — Telephone Encounter (Signed)
Dr. Milus Glazier are you ok with changing patient to generic venlafaxine?

## 2012-11-14 NOTE — Telephone Encounter (Signed)
Please advise on refill and if patient can have generic.

## 2012-11-15 ENCOUNTER — Other Ambulatory Visit: Payer: Self-pay

## 2012-11-15 DIAGNOSIS — Z1231 Encounter for screening mammogram for malignant neoplasm of breast: Secondary | ICD-10-CM

## 2012-11-17 MED ORDER — EFFEXOR XR 75 MG PO CP24: 150.0000 mg | ORAL_CAPSULE | Freq: Every day | ORAL | Status: DC

## 2012-11-20 ENCOUNTER — Telehealth: Payer: Self-pay

## 2012-11-20 NOTE — Telephone Encounter (Signed)
Pt is calling to see if Dr L had received the fax from [facility] about her lab results Call back number is [phone]

## 2012-11-20 NOTE — Telephone Encounter (Signed)
Patient calling to let Dr. Elbert Ewings know that she is faxing over some results to our office now and would like him to personally call her to discuss the results from [facility]. Please do so if possible per patient.   [phone]

## 2012-11-20 NOTE — Telephone Encounter (Signed)
Dr. Elonda Husky

## 2012-11-21 ENCOUNTER — Ambulatory Visit
Admission: RE | Admit: 2012-11-21 | Discharge: 2012-11-21 | Disposition: A | Discharge status: Home / Self Care | Payer: PRIVATE HEALTH INSURANCE | Source: Ambulatory Visit

## 2012-11-21 ENCOUNTER — Telehealth: Payer: Self-pay | Admitting: Family Medicine

## 2012-11-21 DIAGNOSIS — Z1231 Encounter for screening mammogram for malignant neoplasm of breast: Secondary | ICD-10-CM

## 2012-11-21 NOTE — Telephone Encounter (Signed)
Did you get these?

## 2012-11-21 NOTE — Telephone Encounter (Signed)
Patient understands that she has a 20% chance of having follicular thyroid cancer but feels that the risks of surgery are more threatening to her health then the removal of her thyroid gland is recommended by her surgeon. I suggested the patient continue to follow with her surgeon and continue the discussion on further actions

## 2012-11-22 ENCOUNTER — Other Ambulatory Visit: Payer: Self-pay | Admitting: Family Medicine

## 2012-11-22 DIAGNOSIS — R928 Other abnormal and inconclusive findings on diagnostic imaging of breast: Secondary | ICD-10-CM

## 2012-11-27 ENCOUNTER — Encounter: Payer: Self-pay | Admitting: Radiology

## 2012-12-01 ENCOUNTER — Other Ambulatory Visit: Payer: Self-pay | Admitting: Family Medicine

## 2012-12-01 ENCOUNTER — Ambulatory Visit
Admission: RE | Admit: 2012-12-01 | Discharge: 2012-12-01 | Disposition: A | Discharge status: Home / Self Care | Payer: PRIVATE HEALTH INSURANCE | Source: Ambulatory Visit | Attending: Family Medicine | Admitting: Family Medicine

## 2012-12-01 DIAGNOSIS — R928 Other abnormal and inconclusive findings on diagnostic imaging of breast: Secondary | ICD-10-CM

## 2012-12-01 DIAGNOSIS — R921 Mammographic calcification found on diagnostic imaging of breast: Secondary | ICD-10-CM

## 2012-12-04 ENCOUNTER — Other Ambulatory Visit: Payer: Self-pay | Admitting: Family Medicine

## 2012-12-04 DIAGNOSIS — R921 Mammographic calcification found on diagnostic imaging of breast: Secondary | ICD-10-CM

## 2012-12-05 ENCOUNTER — Other Ambulatory Visit: Payer: Self-pay | Admitting: Family Medicine

## 2012-12-06 ENCOUNTER — Telehealth: Payer: Self-pay | Admitting: Family Medicine

## 2012-12-06 ENCOUNTER — Encounter: Payer: Self-pay | Admitting: Radiology

## 2012-12-06 NOTE — Telephone Encounter (Signed)
Patient cannot afford the $2390 for the biopsy.  She is electing to have a follow up mammogram in 6 months.  She realizes that this could be cancer.  She had a 3-D mammogram (10th of June) in recent past which only showed abnormality in opposite (left) breast.  She's also had an ultrasound done by her Ob-Gyn in feb 1997 with subsequent negative biopsy.

## 2013-01-12 ENCOUNTER — Ambulatory Visit (INDEPENDENT_AMBULATORY_CARE_PROVIDER_SITE_OTHER): Payer: No Typology Code available for payment source | Admitting: Internal Medicine

## 2013-01-12 VITALS — BP 136/82 | HR 100 | Temp 98.4°F | Resp 17 | Ht 66.0 in | Wt 145.0 lb

## 2013-01-12 DIAGNOSIS — J329 Chronic sinusitis, unspecified: Secondary | ICD-10-CM

## 2013-01-12 DIAGNOSIS — R51 Headache: Secondary | ICD-10-CM

## 2013-01-12 DIAGNOSIS — R519 Headache, unspecified: Secondary | ICD-10-CM

## 2013-01-12 DIAGNOSIS — R52 Pain, unspecified: Secondary | ICD-10-CM

## 2013-01-12 MED ORDER — AZITHROMYCIN 500 MG PO TABS: 500.0000 mg | ORAL_TABLET | Freq: Every day | ORAL | Status: DC

## 2013-01-12 MED ORDER — OXYCODONE-ACETAMINOPHEN 5-325 MG PO TABS: 1.0000 | ORAL_TABLET | Freq: Three times a day (TID) | ORAL | Status: DC | PRN

## 2013-01-12 MED ORDER — METHYLPREDNISOLONE ACETATE 80 MG/ML IJ SUSP
80.0000 mg | Freq: Once | INTRAMUSCULAR | Status: AC
  Administered 2013-01-12: 80 mg via INTRAMUSCULAR

## 2013-01-12 NOTE — Patient Instructions (Signed)

## 2013-01-12 NOTE — Progress Notes (Signed)
  Subjective:    [name], female    DOB: [birth year], 62 y.o.   MRN: [id number]  HPI Has bad allergys, now has facial pain, yellow green disch, body aches. No cp or sob, dizzy or syncope.   Review of Systems     Objective:   Physical Exam  Constitutional: She is oriented to person, place, and time. She appears well-developed and well-nourished. No distress.  Eyes: Conjunctivae and EOM are normal. Pupils are equal, round, and reactive to light.  Cardiovascular: Normal rate and normal heart sounds.   Pulmonary/Chest: Effort normal and breath sounds normal.  Musculoskeletal: Normal range of motion.  Neurological: She is alert and oriented to person, place, and time. No cranial nerve deficit. She exhibits normal muscle tone. Coordination normal.  Skin: No rash noted.  Psychiatric: She has a normal mood and affect. Judgment and thought content normal.          Assessment & Plan:  Sinusitis/HA/Body aches Depomedrol 80mg Christena Deem 500mg Cherly Anderson 5/325

## 2013-03-12 ENCOUNTER — Telehealth: Payer: Self-pay

## 2013-03-12 NOTE — Telephone Encounter (Signed)
Received fax from [employer] asking for permission for pt to take generic for Effexor to save her money. Called pt and she stated that she tried the generic but it was not strong enough to control her depression or her IBS so she had to switch back to name brand only. Sent denial of change back to [employer]

## 2013-04-11 ENCOUNTER — Other Ambulatory Visit: Payer: Self-pay

## 2013-04-11 DIAGNOSIS — K219 Gastro-esophageal reflux disease without esophagitis: Secondary | ICD-10-CM

## 2013-04-11 MED ORDER — LANSOPRAZOLE 30 MG PO CPDR: 30.0000 mg | DELAYED_RELEASE_CAPSULE | Freq: Every day | ORAL | Status: DC

## 2013-04-19 ENCOUNTER — Ambulatory Visit (INDEPENDENT_AMBULATORY_CARE_PROVIDER_SITE_OTHER): Payer: No Typology Code available for payment source | Admitting: Family Medicine

## 2013-04-19 ENCOUNTER — Encounter: Payer: Self-pay | Admitting: Family Medicine

## 2013-04-19 VITALS — BP 150/90 | HR 93 | Temp 98.5°F | Resp 16 | Ht 65.5 in | Wt 144.0 lb

## 2013-04-19 DIAGNOSIS — F411 Generalized anxiety disorder: Secondary | ICD-10-CM

## 2013-04-19 DIAGNOSIS — E785 Hyperlipidemia, unspecified: Secondary | ICD-10-CM

## 2013-04-19 DIAGNOSIS — F329 Major depressive disorder, single episode, unspecified: Secondary | ICD-10-CM

## 2013-04-19 DIAGNOSIS — Z0000 Encounter for general adult medical examination without abnormal findings: Secondary | ICD-10-CM

## 2013-04-19 DIAGNOSIS — R52 Pain, unspecified: Secondary | ICD-10-CM

## 2013-04-19 DIAGNOSIS — F3289 Other specified depressive episodes: Secondary | ICD-10-CM

## 2013-04-19 DIAGNOSIS — M549 Dorsalgia, unspecified: Secondary | ICD-10-CM

## 2013-04-19 DIAGNOSIS — F419 Anxiety disorder, unspecified: Secondary | ICD-10-CM

## 2013-04-19 DIAGNOSIS — J329 Chronic sinusitis, unspecified: Secondary | ICD-10-CM

## 2013-04-19 DIAGNOSIS — F32A Depression, unspecified: Secondary | ICD-10-CM

## 2013-04-19 DIAGNOSIS — R519 Headache, unspecified: Secondary | ICD-10-CM

## 2013-04-19 DIAGNOSIS — R51 Headache: Secondary | ICD-10-CM

## 2013-04-19 LAB — POCT URINALYSIS DIPSTICK
Glucose, UA: NEGATIVE
Ketones, UA: NEGATIVE
Leukocytes, UA: NEGATIVE
Nitrite, UA: NEGATIVE
Protein, UA: 100
Spec Grav, UA: >=1.03
Urobilinogen, UA: 1
pH, UA: 6.5

## 2013-04-19 LAB — CBC
HCT: 43.5 % (ref 36.0–46.0)
Hemoglobin: 15.9 g/dL — ABNORMAL HIGH (ref 12.0–15.0)
MCH: 34.3 pg — ABNORMAL HIGH (ref 26.0–34.0)
MCHC: 36.6 g/dL — ABNORMAL HIGH (ref 30.0–36.0)
MCV: 93.8 fL (ref 78.0–100.0)
Platelets: 353 10*3/uL (ref 150–400)
RBC: 4.64 MIL/uL (ref 3.87–5.11)
RDW: 12.3 % (ref 11.5–15.5)
WBC: 9.4 10*3/uL (ref 4.0–10.5)

## 2013-04-19 LAB — TSH: TSH: 1.838 u[IU]/mL (ref 0.350–4.500)

## 2013-04-19 LAB — LIPID PANEL
Cholesterol: 234 mg/dL — ABNORMAL HIGH (ref 0–200)
HDL: 46 mg/dL (ref >39)
LDL Cholesterol: 143 mg/dL — ABNORMAL HIGH (ref 0–99)
Total CHOL/HDL Ratio: 5.1 Ratio
Triglycerides: 227 mg/dL — ABNORMAL HIGH (ref <150)
VLDL: 45 mg/dL — ABNORMAL HIGH (ref 0–40)

## 2013-04-19 LAB — COMPREHENSIVE METABOLIC PANEL
ALT: 50 U/L — ABNORMAL HIGH (ref 0–35)
AST: 48 U/L — ABNORMAL HIGH (ref 0–37)
Albumin: 4.8 g/dL (ref 3.5–5.2)
Alkaline Phosphatase: 106 U/L (ref 39–117)
BUN: 11 mg/dL (ref 6–23)
CO2: 30 mEq/L (ref 19–32)
Calcium: 10.1 mg/dL (ref 8.4–10.5)
Chloride: 92 mEq/L — ABNORMAL LOW (ref 96–112)
Creat: 0.72 mg/dL (ref 0.50–1.10)
Glucose, Bld: 126 mg/dL — ABNORMAL HIGH (ref 70–99)
Potassium: 3.4 mEq/L — ABNORMAL LOW (ref 3.5–5.3)
Sodium: 135 mEq/L (ref 135–145)
Total Bilirubin: 1.4 mg/dL — ABNORMAL HIGH (ref 0.3–1.2)
Total Protein: 8.2 g/dL (ref 6.0–8.3)

## 2013-04-19 MED ORDER — PREVACID 30 MG PO CPDR: 30.0000 mg | DELAYED_RELEASE_CAPSULE | Freq: Every day | ORAL | Status: DC

## 2013-04-19 MED ORDER — CYCLOBENZAPRINE HCL 10 MG PO TABS: ORAL_TABLET | ORAL | Status: DC

## 2013-04-19 MED ORDER — CICLESONIDE 50 MCG/ACT NA SUSP: NASAL | Status: DC

## 2013-04-19 MED ORDER — ROSUVASTATIN CALCIUM 20 MG PO TABS: 20.0000 mg | ORAL_TABLET | Freq: Every day | ORAL | Status: DC

## 2013-04-19 MED ORDER — VENLAFAXINE HCL ER 75 MG PO CP24: ORAL_CAPSULE | ORAL | Status: DC

## 2013-04-19 MED ORDER — AZITHROMYCIN 500 MG PO TABS: 500.0000 mg | ORAL_TABLET | Freq: Every day | ORAL | Status: DC

## 2013-04-19 MED ORDER — ALPRAZOLAM 0.5 MG PO TABS: 0.5000 mg | ORAL_TABLET | Freq: Three times a day (TID) | ORAL | Status: DC | PRN

## 2013-04-19 MED ORDER — OXYCODONE-ACETAMINOPHEN 5-325 MG PO TABS: 1.0000 | ORAL_TABLET | Freq: Three times a day (TID) | ORAL | Status: DC | PRN

## 2013-04-19 MED ORDER — AZELASTINE HCL 0.05 % OP SOLN: 1.0000 [drp] | Freq: Every day | OPHTHALMIC | Status: DC

## 2013-04-19 MED ORDER — MAXZIDE 75-50 MG PO TABS: 1.0000 | ORAL_TABLET | Freq: Every day | ORAL | Status: DC

## 2013-04-19 NOTE — Progress Notes (Signed)
[name] MRN: [id number], DOB: [birth year], 62 y.o. Date of Encounter: 04/19/2013, 10:23 AM  Primary Physician: Elvina Sidle, MD  Chief Complaint: Physical (CPE)  HPI: 62 y.o. y/o female with history of noted below here for CPE.  Doing well. No issues/complaints. Colonoscopy: 06/03/2010 MMG:  December 10 scheduled Tetanus shot 2010  Patient works at an [employer]. She was rooming with the previous plan whom she is no longer intimate. Instead she has a friend down in [place]. He spends weekends with him but this is a relationship and arrangement has been strained because of Charlie continuing to livie in her apartment.  She hopes this will change in 6 months when she is done with her car payments.  Review of Systems: Consitutional: No fever, chills, fatigue, night sweats, lymphadenopathy, or weight changes. Eyes: No visual changes, eye redness, or discharge. ENT/Mouth: Ears: No otalgia, tinnitus, hearing loss, discharge. Nose: No congestion, rhinorrhea, sinus pain, or epistaxis. Throat: No sore throat, post nasal drip, or teeth pain. Cardiovascular: No CP, palpitations, diaphoresis, DOE, edema, orthopnea, PND. Respiratory: No cough, hemoptysis, SOB, or wheezing. Gastrointestinal: No anorexia, dysphagia, reflux, pain, nausea, vomiting, hematemesis, diarrhea, constipation, BRBPR, or melena. Breast: No discharge, pain, swelling, or mass. Genitourinary: No dysuria, frequency, urgency, hematuria, incontinence, nocturia, amenorrhea, vaginal discharge, pruritis, burning, abnormal bleeding, or pain. Musculoskeletal: No decreased ROM, myalgias, stiffness, joint swelling, or weakness. Skin: No rash, erythema, lesion changes, pain, warmth, jaundice, or pruritis. Neurological: No headache, dizziness, syncope, seizures, tremors, memory loss, coordination problems, or paresthesias. Psychological: No anxiety, depression, hallucinations, SI/HI. Endocrine: No  fatigue, polydipsia, polyphagia, polyuria, or known diabetes. All other systems were reviewed and are otherwise negative.  Past Medical History  Diagnosis Date  . Sinusitis, chronic   . Hypertension   . Hyperlipidemia   . Colon polyps   . Vitamin D deficiency   . Hiatal hernia   . Allergy   . Anxiety   . Asthma   . Depression   . GERD (gastroesophageal reflux disease)      Past Surgical History  Procedure Laterality Date  . Tonsillectomy    . Nasal septoplasty w/ turbinoplasty      Home Meds:  Prior to Admission medications   Medication Sig Start Date End Date Taking? Authorizing Provider  ALPRAZolam Prudy Feeler) 0.5 MG tablet Take 1 tablet (0.5 mg total) by mouth 3 (three) times daily as needed for sleep. 10/04/12  Yes Elvina Sidle, MD  aspirin 325 MG tablet Take 325 mg by mouth daily as needed.    Yes Historical Provider, MD  azelastine (OPTIVAR) 0.05 % ophthalmic solution instill 1 drop into both eyes twice a day 11/11/12  Yes Eleanore E Egan, PA-C  cetirizine (ZYRTEC) 10 MG tablet Take 10 mg by mouth daily.   Yes Historical Provider, MD  ciclesonide (OMNARIS) 50 MCG/ACT nasal spray instill 2 sprays into each nostril once daily 08/19/12  Yes Sherren Mocha, MD  CRESTOR 20 MG tablet take 1 tablet by mouth once daily 05/25/12  Yes Elvina Sidle, MD  EFFEXOR XR 75 MG 24 hr capsule Take 2 capsules (150 mg total) by mouth daily. 11/14/12  Yes Elvina Sidle, MD  estradiol (CLIMARA - DOSED IN MG/24 HR) 0.05 mg/24hr Place 1 patch (0.05 mg total) onto the skin once a week. 10/04/12  Yes Elvina Sidle, MD  Fluticasone-Salmeterol (ADVAIR) 100-50 MCG/DOSE AEPB Inhale 1 puff into the lungs 2 (two) times daily. 04/03/12  Yes Elvina Sidle, MD  ketoprofen (ORUDIS) 75 MG  capsule Take 1 capsule (75 mg total) by mouth 3 (three) times daily as needed for pain. 10/04/12  Yes Elvina Sidle, MD  lansoprazole (PREVACID) 30 MG capsule Take 1 capsule (30 mg total) by mouth daily. 04/11/13  Yes Elvina Sidle, MD  MAXZIDE 75-50 MG per tablet take 1 tablet by mouth once daily 12/05/12  Yes Ryan M Dunn, PA-C  montelukast (SINGULAIR) 10 MG tablet Take 1 tablet (10 mg total) by mouth at bedtime. 10/04/12  Yes Elvina Sidle, MD  oxyCODONE-acetaminophen (PERCOCET/ROXICET) 5-325 MG per tablet Take 1 tablet by mouth every 8 (eight) hours as needed. 01/12/13  Yes Jonita Albee, MD  PREVACID 30 MG capsule Take 1 capsule (30 mg total) by mouth daily. 08/03/12  Yes Elvina Sidle, MD  venlafaxine XR (EFFEXOR XR) 75 MG 24 hr capsule Two daily 04/03/12  Yes Elvina Sidle, MD  azithromycin (ZITHROMAX) 250 MG tablet Take 2 tabs PO x 1 dose, then 1 tab PO QD x 4 days 10/04/12   Elvina Sidle, MD  azithromycin (ZITHROMAX) 500 MG tablet Take 1 tablet (500 mg total) by mouth daily. 01/12/13   Jonita Albee, MD  cyclobenzaprine (FLEXERIL) 10 MG tablet Prn hs 04/03/12   Elvina Sidle, MD  potassium chloride (K-DUR) 10 MEQ tablet Take 2 tablets (20 mEq total) by mouth 2 (two) times daily. 11/01/12   Elvina Sidle, MD    Allergies:  Allergies  Allergen Reactions  . Levofloxacin     hallucinations  . Prednisone     Anxiety and paranoia  . Amoxicillin Other (See Comments)    diarrhea  . Augmentin [Amoxicillin-Pot Clavulanate] Other (See Comments)    diarrhea    History   Social History  . Marital Status: Divorced    Spouse Name: N/A    Number of Children: N/A  . Years of Education: N/A   Occupational History  . Not on file.   Social History Main Topics  . Smoking status: Passive Smoke Exposure - Never Smoker    Types: Cigarettes  . Smokeless tobacco: Not on file  . Alcohol Use: 1.0 oz/week    2 drink(s) per week     Comment: 1-3/week  . Drug Use: No  . Sexual Activity: Yes   Other Topics Concern  . Not on file   Social History Narrative  . No narrative on file    Family History  Problem Relation Age of Onset  . Stroke Mother   . Hypertension Mother   . Cancer Father      colon,liver  . Heart disease Sister   . Stroke Maternal Grandmother   . Hypertension Maternal Grandmother   . Cancer Maternal Grandfather     esophageal  . Cancer Paternal Grandmother     breast,brain  . Cancer Paternal Grandfather     lung    Physical Exam: Blood pressure 150/90, pulse 93, temperature 98.5 F (36.9 C), temperature source Oral, resp. rate 16, height 5' 5.5" (1.664 m), weight 144 lb (65.318 kg), SpO2 98.00%., Body mass index is 23.59 kg/(m^2). General: Well developed, well nourished, in no acute distress. HEENT: Normocephalic, atraumatic. Conjunctiva pink, sclera non-icteric. Pupils 2 mm constricting to 1 mm, round, regular, and equally reactive to light and accomodation. EOMI. Internal auditory canal clear. TMs with good cone of light and without pathology. Nasal mucosa pink. Nares are without discharge. No sinus tenderness. Oral mucosa pink. Dentition good. Pharynx without exudate. Vertical corneal line corresponding to RK surgery.  Neck: Supple. Trachea midline.  No thyromegaly. Full ROM. No lymphadenopathy. Lungs: Clear to auscultation bilaterally without wheezes, rales, or rhonchi. Breathing is of normal effort and unlabored. Cardiovascular: RRR with S1 S2. No murmurs, rubs, or gallops appreciated. Distal pulses 2+ symmetrically. No carotid or abdominal bruits. Breast: Symmetrical. No masses. Nipples without discharge. Abdomen: Soft, non-tender, non-distended with normoactive bowel sounds. No hepatosplenomegaly or masses. No rebound/guarding. No CVA tenderness. Without hernias.  Musculoskeletal: Full range of motion and 5/5 strength throughout. Without swelling, atrophy, tenderness, crepitus, or warmth. Extremities without clubbing, cyanosis, or edema. Calves supple. Skin: Warm and moist without erythema, ecchymosis, wounds, or rash. Neuro: A+Ox3. CN II-XII grossly intact. Moves all extremities spontaneously. Full sensation throughout. Normal gait. DTR 2+ throughout upper  and lower extremities. Finger to nose intact. Psych:  Responds to questions appropriately with a normal affect.   Studies: CBC, CMET, Lipid, TSH UA:  Assessment/Plan:  62 y.o. y/o female here for CPE - Sinusitis - Plan: azithromycin (ZITHROMAX) 500 MG tablet, oxyCODONE-acetaminophen (PERCOCET/ROXICET) 5-325 MG per tablet  Head ache - Plan: azithromycin (ZITHROMAX) 500 MG tablet, oxyCODONE-acetaminophen (PERCOCET/ROXICET) 5-325 MG per tablet  Body aches - Plan: azithromycin (ZITHROMAX) 500 MG tablet, oxyCODONE-acetaminophen (PERCOCET/ROXICET) 5-325 MG per tablet  Anxiety - Plan: ALPRAZolam (XANAX) 0.5 MG tablet  Hyperlipidemia - Plan: rosuvastatin (CRESTOR) 20 MG tablet  Depression - Plan: venlafaxine XR (EFFEXOR XR) 75 MG 24 hr capsule  Back pain - Plan: cyclobenzaprine (FLEXERIL) 10 MG tablet  Annual physical exam - Plan: POCT urinalysis dipstick, CBC, Comprehensive metabolic panel, Lipid panel, TSH, oxyCODONE-acetaminophen (PERCOCET/ROXICET) 5-325 MG per tablet, cyclobenzaprine (FLEXERIL) 10 MG tablet    Signed, Elvina Sidle, MD 04/19/2013 10:23 AM

## 2013-04-19 NOTE — Patient Instructions (Signed)
Health Maintenance, Female A healthy lifestyle and preventative care can promote health and wellness.  Maintain regular health, dental, and eye exams.  Eat a healthy diet. Foods like vegetables, fruits, whole grains, low-fat dairy products, and lean protein foods contain the nutrients you need without too many calories. Decrease your intake of foods high in solid fats, added sugars, and salt. Get information about a proper diet from your caregiver, if necessary.  Regular physical exercise is one of the most important things you can do for your health. Most adults should get at least 150 minutes of moderate-intensity exercise (any activity that increases your heart rate and causes you to sweat) each week. In addition, most adults need muscle-strengthening exercises on 2 or more days a week.   Maintain a healthy weight. The body mass index (BMI) is a screening tool to identify possible weight problems. It provides an estimate of body fat based on height and weight. Your caregiver can help determine your BMI, and can help you achieve or maintain a healthy weight. For adults 20 years and older:  A BMI below 18.5 is considered underweight.  A BMI of 18.5 to 24.9 is normal.  A BMI of 25 to 29.9 is considered overweight.  A BMI of 30 and above is considered obese.  Maintain normal blood lipids and cholesterol by exercising and minimizing your intake of saturated fat. Eat a balanced diet with plenty of fruits and vegetables. Blood tests for lipids and cholesterol should begin at age 20 and be repeated every 5 years. If your lipid or cholesterol levels are high, you are over 50, or you are a high risk for heart disease, you may need your cholesterol levels checked more frequently.Ongoing high lipid and cholesterol levels should be treated with medicines if diet and exercise are not effective.  If you smoke, find out from your caregiver how to quit. If you do not use tobacco, do not start.  Lung  cancer screening is recommended for adults aged 55 80 years who are at high risk for developing lung cancer because of a history of smoking. Yearly low-dose computed tomography (CT) is recommended for people who have at least a 30-pack-year history of smoking and are a current smoker or have quit within the past 15 years. A pack year of smoking is smoking an average of 1 pack of cigarettes a day for 1 year (for example: 1 pack a day for 30 years or 2 packs a day for 15 years). Yearly screening should continue until the smoker has stopped smoking for at least 15 years. Yearly screening should also be stopped for people who develop a health problem that would prevent them from having lung cancer treatment.  If you are pregnant, do not drink alcohol. If you are breastfeeding, be very cautious about drinking alcohol. If you are not pregnant and choose to drink alcohol, do not exceed 1 drink per day. One drink is considered to be 12 ounces (355 mL) of beer, 5 ounces (148 mL) of wine, or 1.5 ounces (44 mL) of liquor.  Avoid use of street drugs. Do not share needles with anyone. Ask for help if you need support or instructions about stopping the use of drugs.  High blood pressure causes heart disease and increases the risk of stroke. Blood pressure should be checked at least every 1 to 2 years. Ongoing high blood pressure should be treated with medicines, if weight loss and exercise are not effective.  If you are 55 to   61 years old, ask your caregiver if you should take aspirin to prevent strokes.  Diabetes screening involves taking a blood sample to check your fasting blood sugar level. This should be done once every 3 years, after age 45, if you are within normal weight and without risk factors for diabetes. Testing should be considered at a younger age or be carried out more frequently if you are overweight and have at least 1 risk factor for diabetes.  Breast cancer screening is essential preventative care  for women. You should practice "breast self-awareness." This means understanding the normal appearance and feel of your breasts and may include breast self-examination. Any changes detected, no matter how small, should be reported to a caregiver. Women in their 20s and 30s should have a clinical breast exam (CBE) by a caregiver as part of a regular health exam every 1 to 3 years. After age 40, women should have a CBE every year. Starting at age 40, women should consider having a mammogram (breast X-ray) every year. Women who have a family history of breast cancer should talk to their caregiver about genetic screening. Women at a high risk of breast cancer should talk to their caregiver about having an MRI and a mammogram every year.  Breast cancer gene (BRCA)-related cancer risk assessment is recommended for women who have family members with BRCA-related cancers. BRCA-related cancers include breast, ovarian, tubal, and peritoneal cancers. Having family members with these cancers may be associated with an increased risk for harmful changes (mutations) in the breast cancer genes BRCA1 and BRCA2. Results of the assessment will determine the need for genetic counseling and BRCA1 and BRCA2 testing.  The Pap test is a screening test for cervical cancer. Women should have a Pap test starting at age 21. Between ages 21 and 29, Pap tests should be repeated every 2 years. Beginning at age 30, you should have a Pap test every 3 years as long as the past 3 Pap tests have been normal. If you had a hysterectomy for a problem that was not cancer or a condition that could lead to cancer, then you no longer need Pap tests. If you are between ages 65 and 70, and you have had normal Pap tests going back 10 years, you no longer need Pap tests. If you have had past treatment for cervical cancer or a condition that could lead to cancer, you need Pap tests and screening for cancer for at least 20 years after your treatment. If Pap  tests have been discontinued, risk factors (such as a new sexual partner) need to be reassessed to determine if screening should be resumed. Some women have medical problems that increase the chance of getting cervical cancer. In these cases, your caregiver may recommend more frequent screening and Pap tests.  The human papillomavirus (HPV) test is an additional test that may be used for cervical cancer screening. The HPV test looks for the virus that can cause the cell changes on the cervix. The cells collected during the Pap test can be tested for HPV. The HPV test could be used to screen women aged 30 years and older, and should be used in women of any age who have unclear Pap test results. After the age of 30, women should have HPV testing at the same frequency as a Pap test.  Colorectal cancer can be detected and often prevented. Most routine colorectal cancer screening begins at the age of 50 and continues through age 75. However, your caregiver   may recommend screening at an earlier age if you have risk factors for colon cancer. On a yearly basis, your caregiver may provide home test kits to check for hidden blood in the stool. Use of a small camera at the end of a tube, to directly examine the colon (sigmoidoscopy or colonoscopy), can detect the earliest forms of colorectal cancer. Talk to your caregiver about this at age 102, when routine screening begins. Direct examination of the colon should be repeated every 5 to 10 years through age 40, unless early forms of pre-cancerous polyps or small growths are found.  Hepatitis C blood testing is recommended for all people born from [birth year] through 1964 and any individual with known risks for hepatitis C.  Practice safe sex. Use condoms and avoid high-risk sexual practices to reduce the spread of sexually transmitted infections (STIs). Sexually active women aged 35 and younger should be checked for Chlamydia, which is a common sexually transmitted infection.  Older women with new or multiple partners should also be tested for Chlamydia. Testing for other STIs is recommended if you are sexually active and at increased risk.  Osteoporosis is a disease in which the bones lose minerals and strength with aging. This can result in serious bone fractures. The risk of osteoporosis can be identified using a bone density scan. Women ages 84 and over and women at risk for fractures or osteoporosis should discuss screening with their caregivers. Ask your caregiver whether you should be taking a calcium supplement or vitamin D to reduce the rate of osteoporosis.  Menopause can be associated with physical symptoms and risks. Hormone replacement therapy is available to decrease symptoms and risks. You should talk to your caregiver about whether hormone replacement therapy is right for you.  Use sunscreen. Apply sunscreen liberally and repeatedly throughout the day. You should seek shade when your shadow is shorter than you. Protect yourself by wearing long sleeves, pants, a wide-brimmed hat, and sunglasses year round, whenever you are outdoors.  Notify your caregiver of new moles or changes in moles, especially if there is a change in shape or color. Also notify your caregiver if a mole is larger than the size of a pencil eraser.  Stay current with your immunizations. Document Released: 12/14/2010 Document Revised: 09/25/2012 Document Reviewed: 12/14/2010 [facility] Patient Information 2014 [place], [place]. Advance Directives (My Voice, My Choice) Advance directives are a means for you to make choices about your health care. It is a way that you may accept or refuse medical treatment if you cannot speak for yourself. An advance directive gives you a way to express your wishes about treatment choices in the event that you cannot speak for yourself. These directives protect your right to make your own health care choices. Some examples of advance directives would be:  A  living will is a prepared document that designates your wishes in the event of a serious illness when you cannot care for yourself.  A patient advocate designation for health care means you choose someone who knows your wishes and can speak for you, on your behalf, should you not be able to do so yourself. This is often a close friend or family member.  Think about what is important for you in your life. To what extent do you want machines to keep you alive? How much pain are you willing to accept?  Decide what types of life-sustaining treatments you would or would not want.  Name a person to be your advocate who understands  all your wishes and is willing and able to carry them out.  A durable power of attorney for health care is a formal legal agreement with an attorney or legal representative who will be bound to carry out your wishes in the event you are unable to care for or represent yourself. This should be someone you trust to make important medical decisions for you.  Do Not Resuscitate (DNR) is a request to do nothing in the event that your heart stops. A DNR order is used if you are very ill and not expected to recover. DNR orders are accepted by nearly all caregivers and hospitals. Most caregiver's offices and hospitals have advance directive forms you can use. You may cancel or change these documents at any time. You must be mentally sound and able to communicate your wishes at the time you fill out these forms. Regardless of how you let your final wishes be known in the event of a terminal illness, make sure you discuss them with your family and friends. Copies should be given to your caregiver, your hospital, your advocate or attorney, and to significant others. If you travel, you may want to find out what is legal and binding in the states where you will be. Laws vary from state to state. Document Released: 08/09/2001 Document Revised: 08/23/2011 Document Reviewed: 02/06/2008 [facility]  Patient Information 2014 [place], [place].

## 2013-04-20 ENCOUNTER — Other Ambulatory Visit: Payer: Self-pay | Admitting: Family Medicine

## 2013-04-20 DIAGNOSIS — E876 Hypokalemia: Secondary | ICD-10-CM

## 2013-04-20 MED ORDER — POTASSIUM CHLORIDE ER 10 MEQ PO TBCR: 10.0000 meq | EXTENDED_RELEASE_TABLET | Freq: Every day | ORAL | Status: DC

## 2013-05-23 ENCOUNTER — Ambulatory Visit
Admission: RE | Admit: 2013-05-23 | Discharge: 2013-05-23 | Disposition: A | Discharge status: Home / Self Care | Payer: PRIVATE HEALTH INSURANCE | Source: Ambulatory Visit | Attending: Family Medicine | Admitting: Family Medicine

## 2013-05-23 DIAGNOSIS — R921 Mammographic calcification found on diagnostic imaging of breast: Secondary | ICD-10-CM

## 2013-05-29 ENCOUNTER — Telehealth: Payer: Self-pay

## 2013-05-29 DIAGNOSIS — F32A Depression, unspecified: Secondary | ICD-10-CM

## 2013-05-29 DIAGNOSIS — F329 Major depressive disorder, single episode, unspecified: Secondary | ICD-10-CM

## 2013-05-29 MED ORDER — EFFEXOR XR 75 MG PO CP24: 150.0000 mg | ORAL_CAPSULE | Freq: Every day | ORAL | Status: DC

## 2013-05-29 NOTE — Telephone Encounter (Signed)
Brand name only written for patient and sent to [facility]

## 2013-05-29 NOTE — Telephone Encounter (Signed)
Pt states that dr Milus Glazier called in her efexer refill but it needs to be in name brand only  Best number [phone]

## 2013-06-14 HISTORY — PX: POLYPECTOMY: SHX149

## 2013-06-30 ENCOUNTER — Other Ambulatory Visit: Payer: Self-pay | Admitting: Family Medicine

## 2013-08-28 ENCOUNTER — Telehealth: Payer: Self-pay

## 2013-08-28 MED ORDER — TRIAMTERENE-HCTZ 75-50 MG PO TABS: 1.0000 | ORAL_TABLET | Freq: Every day | ORAL | Status: DC

## 2013-08-28 NOTE — Telephone Encounter (Signed)
Received fax from [facility] advising pt will save money by switching to generic Maxzide instead of name brand. Last Rx was written as name brand only. Called pt to verify that she needs name brand and she stated she CAN use generic Maxzide and Prevacid. She only needs name brand on the Effexor. I will send in Rx for generic so that pt's co-pay will be lower.

## 2013-09-05 ENCOUNTER — Other Ambulatory Visit: Payer: Self-pay | Admitting: Family Medicine

## 2013-09-19 MED ORDER — TRIAMTERENE-HCTZ 75-50 MG PO TABS: 1.0000 | ORAL_TABLET | Freq: Every day | ORAL | Status: DC

## 2013-09-19 NOTE — Addendum Note (Signed)
Addended by: Elwyn Reach A on: 09/19/2013 06:12 PM   Modules accepted: Orders

## 2013-09-19 NOTE — Telephone Encounter (Signed)
Received another fax to change Maxzide to generic. Per pt req, I will resend new Rx electronically w/note to fill w/generic.

## 2013-10-02 ENCOUNTER — Other Ambulatory Visit: Payer: Self-pay | Admitting: Family Medicine

## 2013-10-04 ENCOUNTER — Other Ambulatory Visit: Payer: Self-pay | Admitting: Family Medicine

## 2013-11-03 ENCOUNTER — Other Ambulatory Visit: Payer: Self-pay | Admitting: Family Medicine

## 2013-11-07 NOTE — Telephone Encounter (Signed)
Called in.

## 2013-11-12 ENCOUNTER — Other Ambulatory Visit: Payer: Self-pay | Admitting: Family Medicine

## 2013-12-03 ENCOUNTER — Other Ambulatory Visit: Payer: Self-pay | Admitting: Family Medicine

## 2013-12-03 DIAGNOSIS — R921 Mammographic calcification found on diagnostic imaging of breast: Secondary | ICD-10-CM

## 2013-12-07 ENCOUNTER — Other Ambulatory Visit: Payer: Self-pay | Admitting: Family Medicine

## 2013-12-13 ENCOUNTER — Other Ambulatory Visit: Payer: Self-pay | Admitting: Family Medicine

## 2013-12-13 ENCOUNTER — Other Ambulatory Visit: Payer: Self-pay

## 2013-12-13 DIAGNOSIS — R921 Mammographic calcification found on diagnostic imaging of breast: Secondary | ICD-10-CM

## 2013-12-17 ENCOUNTER — Other Ambulatory Visit: Payer: Self-pay

## 2013-12-17 DIAGNOSIS — F32A Depression, unspecified: Secondary | ICD-10-CM

## 2013-12-17 DIAGNOSIS — F329 Major depressive disorder, single episode, unspecified: Secondary | ICD-10-CM

## 2013-12-17 MED ORDER — VENLAFAXINE HCL ER 75 MG PO CP24: ORAL_CAPSULE | ORAL | Status: DC

## 2013-12-18 ENCOUNTER — Ambulatory Visit
Admission: RE | Admit: 2013-12-18 | Discharge: 2013-12-18 | Disposition: A | Discharge status: Home / Self Care | Payer: 59 | Source: Ambulatory Visit | Attending: Family Medicine | Admitting: Family Medicine

## 2013-12-18 ENCOUNTER — Other Ambulatory Visit: Payer: Self-pay | Admitting: Family Medicine

## 2013-12-18 DIAGNOSIS — R921 Mammographic calcification found on diagnostic imaging of breast: Secondary | ICD-10-CM

## 2013-12-23 ENCOUNTER — Ambulatory Visit (INDEPENDENT_AMBULATORY_CARE_PROVIDER_SITE_OTHER): Payer: No Typology Code available for payment source | Admitting: Family Medicine

## 2013-12-23 VITALS — BP 124/82 | HR 88 | Temp 97.7°F | Resp 20 | Ht 65.75 in | Wt 150.0 lb

## 2013-12-23 DIAGNOSIS — J40 Bronchitis, not specified as acute or chronic: Secondary | ICD-10-CM

## 2013-12-23 DIAGNOSIS — M129 Arthropathy, unspecified: Secondary | ICD-10-CM

## 2013-12-23 DIAGNOSIS — E876 Hypokalemia: Secondary | ICD-10-CM

## 2013-12-23 DIAGNOSIS — Z7289 Other problems related to lifestyle: Secondary | ICD-10-CM

## 2013-12-23 DIAGNOSIS — E785 Hyperlipidemia, unspecified: Secondary | ICD-10-CM

## 2013-12-23 DIAGNOSIS — J322 Chronic ethmoidal sinusitis: Secondary | ICD-10-CM

## 2013-12-23 DIAGNOSIS — F329 Major depressive disorder, single episode, unspecified: Secondary | ICD-10-CM

## 2013-12-23 DIAGNOSIS — F32A Depression, unspecified: Secondary | ICD-10-CM

## 2013-12-23 DIAGNOSIS — R7401 Elevation of levels of liver transaminase levels: Secondary | ICD-10-CM

## 2013-12-23 DIAGNOSIS — Z789 Other specified health status: Secondary | ICD-10-CM

## 2013-12-23 DIAGNOSIS — M1990 Unspecified osteoarthritis, unspecified site: Secondary | ICD-10-CM

## 2013-12-23 DIAGNOSIS — K219 Gastro-esophageal reflux disease without esophagitis: Secondary | ICD-10-CM

## 2013-12-23 DIAGNOSIS — I10 Essential (primary) hypertension: Secondary | ICD-10-CM

## 2013-12-23 DIAGNOSIS — R7402 Elevation of levels of lactic acid dehydrogenase (LDH): Secondary | ICD-10-CM

## 2013-12-23 DIAGNOSIS — F1090 Alcohol use, unspecified, uncomplicated: Secondary | ICD-10-CM

## 2013-12-23 DIAGNOSIS — Z79899 Other long term (current) drug therapy: Secondary | ICD-10-CM

## 2013-12-23 DIAGNOSIS — F411 Generalized anxiety disorder: Secondary | ICD-10-CM

## 2013-12-23 DIAGNOSIS — F3289 Other specified depressive episodes: Secondary | ICD-10-CM

## 2013-12-23 DIAGNOSIS — R740 Nonspecific elevation of levels of transaminase and lactic acid dehydrogenase [LDH]: Principal | ICD-10-CM

## 2013-12-23 MED ORDER — POTASSIUM CHLORIDE ER 10 MEQ PO TBCR: 10.0000 meq | EXTENDED_RELEASE_TABLET | Freq: Every day | ORAL | Status: DC

## 2013-12-23 MED ORDER — ROSUVASTATIN CALCIUM 20 MG PO TABS: 20.0000 mg | ORAL_TABLET | Freq: Every day | ORAL | Status: DC

## 2013-12-23 MED ORDER — TRIAMTERENE-HCTZ 75-50 MG PO TABS: 1.0000 | ORAL_TABLET | Freq: Every day | ORAL | Status: DC

## 2013-12-23 MED ORDER — MONTELUKAST SODIUM 10 MG PO TABS: ORAL_TABLET | ORAL | Status: DC

## 2013-12-23 MED ORDER — METHYLPREDNISOLONE ACETATE 80 MG/ML IJ SUSP
80.0000 mg | Freq: Once | INTRAMUSCULAR | Status: AC
  Administered 2013-12-23: 80 mg via INTRAMUSCULAR

## 2013-12-23 MED ORDER — LANSOPRAZOLE 30 MG PO CPDR: 30.0000 mg | DELAYED_RELEASE_CAPSULE | Freq: Every day | ORAL | Status: DC

## 2013-12-23 MED ORDER — FLUTICASONE-SALMETEROL 100-50 MCG/DOSE IN AEPB: 1.0000 | INHALATION_SPRAY | Freq: Two times a day (BID) | RESPIRATORY_TRACT | Status: DC

## 2013-12-23 MED ORDER — VENLAFAXINE HCL ER 75 MG PO CP24: 75.0000 mg | ORAL_CAPSULE | Freq: Two times a day (BID) | ORAL | Status: DC

## 2013-12-23 NOTE — Progress Notes (Addendum)
Subjective:   This chart was scribed for Delman Cheadle MD by Forrestine Him, [facility] Scribe. This patient was seen in room 1 and the patient's care was started 2:04 PM.    [name], female    DOB: May 19, 1951, 63 y.o.   MRN: [id number]  Chief Complaint  Patient presents with  . Sinusitis    scratchy throat, drainage, ears stopped up, cough and feeling tired, nose bleeds.  pt also needs refills of meds.       HPI  HPI Comments: Miranda Cochran is a 63 y.o. female who presents to [facility] complaining of a recurrence of her sinusitis x 1 week that is progressively worsened. She reports a scratchy throat, postnasal drip, cough, fatigue, and epistaxis. She has tried OTC Claritin-D and Flonase with mild improvement of symptoms. She has been using her Advair this past wk which she only uses with acute illnesses. Pt requests steroid shot for her sinus infection since it is "just an anti-inflammatory" - "I do fine on cortisone, I just can't take prednisone."  She also wants a azithromycin again - last visit she was prescribed 500 qd which she likes and wants a refill of for her recurrent sinusitis as she is allergic to augmentin/amox and levaquin.  She also needs a refill on ALL of her chronic medications including several controlled substances. Reports her PCP is not in the office for 5d and she is completely out so needs refills today.  Likes to keep a rx for oxycodone and flexeril around in case her arthritis flairs.    Pt last seen for a CPE with PCP Dr. Joseph Art in November 2014. At that time she was again found to have elevated transaminases. Pt states liver is only irritated when she consumed a large amount of alcohol which she does every weekend - prefers tequila in [place]. Pt states her regular alcohol use is not a problem as she was drinking daily but was able to cut down to weekends only.    Pt was started on K after last labs -  3 mo supply sent to pharmacy and pt was advised to have labs rechecked 1 month. However, pt never followed for recheck. She is chronically low on K on last prior lab draws but pt states she is on a potassium increasing diuretic (triamterine) so doesn't need to take K long term.  Denies any myalgias at this time.  Pt is requesting refills of Effexor, Xanax, Prevacid, Crestor, and Advair as well. Has to have brand name effexor - takes bid due to med easier on her IBS that way but occ forgets second dose.  Really just using xanax 1 tab qhs for sleep - rarely takes a second tab during the day - only if some stressor induces her anxiety.  Last colonoscopy 4 years ago and was to be repeated in 5 years so she will call her GI dr soon.  She has a + FHx of colon cancer in her father and had polyps on last colonoscopy.     Past Medical History  Diagnosis Date  . Sinusitis, chronic   . Hypertension   . Hyperlipidemia   . Colon polyps   . Vitamin D deficiency   . Hiatal hernia   . Allergy   . Anxiety   . Asthma   . Depression   . GERD (gastroesophageal reflux disease)     Current Outpatient Prescriptions on File Prior  to Visit  Medication Sig Dispense Refill  . ALPRAZolam (XANAX) 0.5 MG tablet take 1 tablet by mouth three times a day if needed for sleep  90 tablet  5  . aspirin 325 MG tablet Take 325 mg by mouth daily as needed.       Marland Kitchen azelastine (OPTIVAR) 0.05 % ophthalmic solution apply 1 drop into affected eye daily  6 mL  0  . cetirizine (ZYRTEC) 10 MG tablet Take 10 mg by mouth daily.      . Fluticasone-Salmeterol (ADVAIR) 100-50 MCG/DOSE AEPB Inhale 1 puff into the lungs 2 (two) times daily.  1 each  3  . ketoprofen (ORUDIS) 75 MG capsule Take 1 capsule (75 mg total) by mouth 3 (three) times daily as needed for pain.  30 capsule  3  . lansoprazole (PREVACID) 30 MG capsule Take 1 capsule (30 mg total) by mouth daily.  30 capsule  0  . montelukast (SINGULAIR) 10 MG tablet take 1 tablet by  mouth at bedtime  30 tablet  3  . oxyCODONE-acetaminophen (PERCOCET/ROXICET) 5-325 MG per tablet Take 1 tablet by mouth every 8 (eight) hours as needed.  30 tablet  0  . potassium chloride (K-DUR) 10 MEQ tablet Take 1 tablet (10 mEq total) by mouth daily.  30 tablet  2  . rosuvastatin (CRESTOR) 20 MG tablet Take 1 tablet (20 mg total) by mouth daily.  30 tablet  5  . triamterene-hydrochlorothiazide (MAXZIDE) 75-50 MG per tablet Take 1 tablet by mouth daily.  90 tablet  0  . venlafaxine XR (EFFEXOR XR) 75 MG 24 hr capsule Take Two capsules by mouth daily  180 capsule  0  . cyclobenzaprine (FLEXERIL) 10 MG tablet Prn hs  30 tablet  0   No current facility-administered medications on file prior to visit.    Allergies  Allergen Reactions  . Levofloxacin     hallucinations  . Prednisone     Anxiety and paranoia  . Amoxicillin Other (See Comments)    diarrhea  . Augmentin [Amoxicillin-Pot Clavulanate] Other (See Comments)    diarrhea    Family History  Problem Relation Age of Onset  . Stroke Mother   . Hypertension Mother   . Cancer Father     colon,liver  . Heart disease Sister   . Stroke Maternal Grandmother   . Hypertension Maternal Grandmother   . Cancer Maternal Grandfather     esophageal  . Cancer Paternal Grandmother     breast,brain  . Cancer Paternal Grandfather     lung   History   Social History  . Marital Status: Divorced    Spouse Name: N/A    Number of Children: N/A  . Years of Education: N/A   Social History Main Topics  . Smoking status: Passive Smoke Exposure - Never Smoker    Types: Cigarettes  . Smokeless tobacco: None  . Alcohol Use: 1.0 oz/week    2 drink(s) per week     Comment: 1-3/week  . Drug Use: No  . Sexual Activity: Yes   Other Topics Concern  . None   Social History Narrative  . None    Review of Systems  Constitutional: Positive for fatigue. Negative for fever, chills, activity change, appetite change and unexpected weight  change.  HENT: Positive for congestion, ear pain, nosebleeds, postnasal drip, rhinorrhea, sinus pressure and sore throat. Negative for facial swelling, sneezing and trouble swallowing.   Respiratory: Positive for cough and shortness of breath. Negative for chest  tightness and wheezing.   Cardiovascular: Negative for chest pain.  Gastrointestinal: Negative for nausea and vomiting.  Musculoskeletal: Positive for arthralgias. Negative for myalgias.  Neurological: Negative for syncope.  Hematological: Positive for adenopathy.    Triage Vitals: BP 124/82  Pulse 88  Temp(Src) 97.7 F (36.5 C) (Oral)  Resp 20  Ht 5' 5.75" (1.67 m)  Wt 150 lb (68.04 kg)  BMI 24.40 kg/m2  SpO2 98%   Objective:  Physical Exam  Nursing note and vitals reviewed. Constitutional: She is oriented to person, place, and time. She appears well-developed and well-nourished.  HENT:  Head: Normocephalic.  Right Ear: Hearing, tympanic membrane, external ear and ear canal normal.  Left Ear: Hearing, tympanic membrane, external ear and ear canal normal. Tympanic membrane is not injected, not erythematous and not retracted.  No middle ear effusion.  Nose: Mucosal edema present.  Mouth/Throat: No oropharyngeal exudate, posterior oropharyngeal edema or posterior oropharyngeal erythema.  Eyes: EOM are normal.  Neck: Normal range of motion.  Cardiovascular: Normal rate, regular rhythm, normal heart sounds and intact distal pulses.  Exam reveals no gallop and no friction rub.   No murmur heard. Pulmonary/Chest: Effort normal.  Abdominal: She exhibits no distension.  Musculoskeletal: Normal range of motion.  Lymphadenopathy:       Head (right side): No submental and no submandibular adenopathy present.       Head (left side): Submandibular adenopathy present. No submental adenopathy present.    She has no cervical adenopathy.       Right cervical: No posterior cervical adenopathy present.      Left cervical: No posterior  cervical adenopathy present.  Neurological: She is alert and oriented to person, place, and time.  Psychiatric: Her speech is normal. Cognition and memory are normal.     Assessment & Plan:   TRANSAMINASES, SERUM, ELEVATED with Alcohol use - pt declines repeat LFTs today as she has had margaritas this wkend - used to drink daily so cutting down - doesn't perceive it as a problem despite recurrent signs of liver inflammation on labs - pt plans to come in to see her PCP Dr. Joseph Art on Fri so she can have labs drawn after she has abstained from alcohol for 5d.  If continues to be elevated, consider [place] and encourage etoh cessation, may want to do trial off of crestor.  Chronic ethmoidal sinusitis - Plan: methylPREDNISolone acetate (DEPO-MEDROL) injection 80 mg - pt has prednisone as an "allergy" but states it is only the oral - not the IM in addition to azithro (augmentin and levaquin allergy)  Essential hypertension - well controlled on maxzide  Hyperlipidemia - Plan: rosuvastatin (CRESTOR) 20 MG tablet  ANXIETY - defer refill on her controlled substances to PCP  Arthritis - defer refill on her controlled substances to PCP  Encounter for long-term (current) use of other medications - I recommended that pt have CMP and CBC tonight and would refill her controlled substances x 1 mo so she could schedule w/ PCP but pt declines labs - would rather f/u w/ PCP for labs after she has been abstaining for EtOH for sev d so will defer refill on xanax, flexeril, oxycodone to PCP who she plans to see for labs and recheck in 5d.  Hypokalemia - Plan: potassium chloride (K-DUR) 10 MEQ tablet - per pt she is not supposed to be on this chronically due to her potassium-sparing maxzide but all recent labs show low K - considering pt's chronic myalgias I suspect she  would benefit from taking regularly - restart K now to recheck at f/u in 5d.  Bronchitis - Plan: Fluticasone-Salmeterol (ADVAIR) 100-50 MCG/DOSE AEPB  - pt only using with acute illness - second-hand smoke exp only  Gastroesophageal reflux disease, esophagitis presence not specified - Plan: lansoprazole (PREVACID) 30 MG capsule refilled - s/p prior GI w/u per pt  Depression - Plan: venlafaxine XR (EFFEXOR XR) 75 MG 24 hr capsule - pt needs brand name only so written on rx - likes taking bid despite it being a qd medication - easier on her IBS.    Meds ordered this encounter  Medications  . methylPREDNISolone acetate (DEPO-MEDROL) injection 80 mg    Sig:   . Fluticasone-Salmeterol (ADVAIR) 100-50 MCG/DOSE AEPB    Sig: Inhale 1 puff into the lungs 2 (two) times daily.    Dispense:  1 each    Refill:  0  . lansoprazole (PREVACID) 30 MG capsule    Sig: Take 1 capsule (30 mg total) by mouth daily.    Dispense:  30 capsule    Refill:  0    In the future, pls send reqs electronically.  . montelukast (SINGULAIR) 10 MG tablet    Sig: take 1 tablet by mouth at bedtime    Dispense:  30 tablet    Refill:  3  . rosuvastatin (CRESTOR) 20 MG tablet    Sig: Take 1 tablet (20 mg total) by mouth daily.    Dispense:  90 tablet    Refill:  1  . triamterene-hydrochlorothiazide (MAXZIDE) 75-50 MG per tablet    Sig: Take 1 tablet by mouth daily.    Dispense:  90 tablet    Refill:  0    MAY FILL WITH GENERIC  . venlafaxine XR (EFFEXOR XR) 75 MG 24 hr capsule    Sig: Take 1 capsule (75 mg total) by mouth 2 (two) times daily with a meal.    Dispense:  180 capsule    Refill:  0    Brand Name ONLY  . potassium chloride (K-DUR) 10 MEQ tablet    Sig: Take 1 tablet (10 mEq total) by mouth daily.    Dispense:  90 tablet    Refill:  0  . azithromycin (ZITHROMAX) 500 MG tablet    Sig: Take 1 tablet (500 mg total) by mouth daily.    Dispense:  5 tablet    Refill:  0    I personally performed the services described in this documentation, which was scribed in my presence. The recorded information has been reviewed and considered, and addended by me as  needed.  Delman Cheadle, MD MPH

## 2013-12-23 NOTE — Patient Instructions (Signed)
Sinusitis Sinusitis is redness, soreness, and swelling (inflammation) of the paranasal sinuses. Paranasal sinuses are air pockets within the bones of your face (beneath the eyes, the middle of the forehead, or above the eyes). In healthy paranasal sinuses, mucus is able to drain out, and air is able to circulate through them by way of your nose. However, when your paranasal sinuses are inflamed, mucus and air can become trapped. This can allow bacteria and other germs to grow and cause infection. Sinusitis can develop quickly and last only a short time (acute) or continue over a long period (chronic). Sinusitis that lasts for more than 12 weeks is considered chronic.  CAUSES  Causes of sinusitis include:  Allergies.  Structural abnormalities, such as displacement of the cartilage that separates your nostrils (deviated septum), which can decrease the air flow through your nose and sinuses and affect sinus drainage.  Functional abnormalities, such as when the small hairs (cilia) that line your sinuses and help remove mucus do not work properly or are not present. SYMPTOMS  Symptoms of acute and chronic sinusitis are the same. The primary symptoms are pain and pressure around the affected sinuses. Other symptoms include:  Upper toothache.  Earache.  Headache.  Bad breath.  Decreased sense of smell and taste.  A cough, which worsens when you are lying flat.  Fatigue.  Fever.  Thick drainage from your nose, which often is green and may contain pus (purulent).  Swelling and warmth over the affected sinuses. DIAGNOSIS  Your caregiver will perform a physical exam. During the exam, your caregiver may:  Look in your nose for signs of abnormal growths in your nostrils (nasal polyps).  Tap over the affected sinus to check for signs of infection.  View the inside of your sinuses (endoscopy) with a special imaging device with a light attached (endoscope), which is inserted into your  sinuses. If your caregiver suspects that you have chronic sinusitis, one or more of the following tests may be recommended:  Allergy tests.  Nasal culture--A sample of mucus is taken from your nose and sent to a lab and screened for bacteria.  Nasal cytology--A sample of mucus is taken from your nose and examined by your caregiver to determine if your sinusitis is related to an allergy. TREATMENT  Most cases of acute sinusitis are related to a viral infection and will resolve on their own within 10 days. Sometimes medicines are prescribed to help relieve symptoms (pain medicine, decongestants, nasal steroid sprays, or saline sprays).  However, for sinusitis related to a bacterial infection, your caregiver will prescribe antibiotic medicines. These are medicines that will help kill the bacteria causing the infection.  Rarely, sinusitis is caused by a fungal infection. In theses cases, your caregiver will prescribe antifungal medicine. For some cases of chronic sinusitis, surgery is needed. Generally, these are cases in which sinusitis recurs more than 3 times per year, despite other treatments. HOME CARE INSTRUCTIONS   Drink plenty of water. Water helps thin the mucus so your sinuses can drain more easily.  Use a humidifier.  Inhale steam 3 to 4 times a day (for example, sit in the bathroom with the shower running).  Apply a warm, moist washcloth to your face 3 to 4 times a day, or as directed by your caregiver.  Use saline nasal sprays to help moisten and clean your sinuses.  Take over-the-counter or prescription medicines for pain, discomfort, or fever only as directed by your caregiver. SEEK IMMEDIATE MEDICAL CARE IF:    You have increasing pain or severe headaches.  You have nausea, vomiting, or drowsiness.  You have swelling around your face.  You have vision problems.  You have a stiff neck.  You have difficulty breathing. MAKE SURE YOU:   Understand these  instructions.  Will watch your condition.  Will get help right away if you are not doing well or get worse. Document Released: 05/31/2005 Document Revised: 08/23/2011 Document Reviewed: 06/15/2011 [facility] Patient Information 2015 [place], [place]. This information is not intended to replace advice given to you by your health care provider. Make sure you discuss any questions you have with your health care provider. Potassium Content of Foods Potassium is a mineral found in many foods and drinks. It helps keep fluids and minerals balanced in your body and affects how steadily your heart beats. Potassium also helps control your blood pressure and keep your muscles and nervous system healthy. Certain health conditions and medicines may change the balance of potassium in your body. When this happens, you can help balance your level of potassium through the foods that you do or do not eat. Your health care provider or dietitian may recommend an amount of potassium that you should have each day. The following lists of foods provide the amount of potassium (in parentheses) per serving in each item. HIGH IN POTASSIUM  The following foods and beverages have 200 mg or more of potassium per serving:  Apricots, 2 raw or 5 dry (200 mg).  Artichoke, 1 medium (345 mg).  Avocado, raw,  each (245 mg).  Banana, 1 medium (425 mg).  Beans, lima, or baked beans, canned,  cup (280 mg).  Beans, white, canned,  cup (595 mg).  Beef roast, 3 oz (320 mg).  Beef, ground, 3 oz (270 mg).  Beets, raw or cooked,  cup (260 mg).  Bran muffin, 2 oz (300 mg).  Broccoli,  cup (230 mg).  Brussels sprouts,  cup (250 mg).  Cantaloupe,  cup (215 mg).  Cereal, 100% bran,  cup (200-400 mg).  Cheeseburger, single, fast food, 1 each (225-400 mg).  Chicken, 3 oz (220 mg).  Clams, canned, 3 oz (535 mg).  Crab, 3 oz (225 mg).  Dates, 5 each (270 mg).  Dried beans and peas,  cup (300-475 mg).  Figs,  dried, 2 each (260 mg).  Fish: halibut, tuna, cod, snapper, 3 oz (480 mg).  Fish: salmon, haddock, swordfish, perch, 3 oz (300 mg).  Fish, tuna, canned 3 oz (200 mg).  [place] fries, fast food, 3 oz (470 mg).  Granola with fruit and nuts,  cup (200 mg).  Grapefruit juice,  cup (200 mg).  Greens, beet,  cup (655 mg).  Honeydew melon,  cup (200 mg).  Kale, raw, 1 cup (300 mg).  Kiwi, 1 medium (240 mg).  Kohlrabi, rutabaga, parsnips,  cup (280 mg).  Lentils,  cup (365 mg).  Mango, 1 each (325 mg).  Milk, chocolate, 1 cup (420 mg).  Milk: nonfat, low-fat, whole, buttermilk, 1 cup (350-380 mg).  Molasses, 1 Tbsp (295 mg).  Mushrooms,  cup (280) mg.  Nectarine, 1 each (275 mg).  Nuts: almonds, peanuts, hazelnuts, [place], cashew, mixed, 1 oz (200 mg).  Nuts, pistachios, 1 oz (295 mg).  Orange, 1 each (240 mg).  Orange juice,  cup (235 mg).  Papaya, medium,  fruit (390 mg).  Peanut butter, chunky, 2 Tbsp (240 mg).  Peanut butter, smooth, 2 Tbsp (210 mg).  Pear, 1 medium (200 mg).  Pomegranate, 1 whole (  400 mg).  Pomegranate juice,  cup (215 mg).  Pork, 3 oz (350 mg).  Potato chips, salted, 1 oz (465 mg).  Potato, baked with skin, 1 medium (925 mg).  Potatoes, boiled,  cup (255 mg).  Potatoes, mashed,  cup (330 mg).  Prune juice,  cup (370 mg).  Prunes, 5 each (305 mg).  Pudding, chocolate,  cup (230 mg).  Pumpkin, canned,  cup (250 mg).  Raisins, seedless,  cup (270 mg).  Seeds, sunflower or pumpkin, 1 oz (240 mg).  Soy milk, 1 cup (300 mg).  Spinach,  cup (420 mg).  Spinach, canned,  cup (370 mg).  Sweet potato, baked with skin, 1 medium (450 mg).  Swiss chard,  cup (480 mg).  Tomato or vegetable juice,  cup (275 mg).  Tomato sauce or puree,  cup (400-550 mg).  Tomato, raw, 1 medium (290 mg).  Tomatoes, canned,  cup (200-300 mg).  [place], 3 oz (250 mg).  Wheat germ, 1 oz (250 mg).  Winter squash,  cup  (250 mg).  Yogurt, plain or fruited, 6 oz (260-435 mg).  Zucchini,  cup (220 mg). MODERATE IN POTASSIUM The following foods and beverages have 50-200 mg of potassium per serving:  Apple, 1 each (150 mg).  Apple juice,  cup (150 mg).  Applesauce,  cup (90 mg).  Apricot nectar,  cup (140 mg).  Asparagus, small spears,  cup or 6 spears (155 mg).  Bagel, cinnamon raisin, 1 each (130 mg).  Bagel, egg or plain, 4 in., 1 each (70 mg).  Beans, green,  cup (90 mg).  Beans, yellow,  cup (190 mg).  Beer, regular, 12 oz (100 mg).  Beets, canned,  cup (125 mg).  Blackberries,  cup (115 mg).  Blueberries,  cup (60 mg).  Bread, whole wheat, 1 slice (70 mg).  Broccoli, raw,  cup (145 mg).  Cabbage,  cup (150 mg).  Carrots, cooked or raw,  cup (180 mg).  Cauliflower, raw,  cup (150 mg).  Celery, raw,  cup (155 mg).  Cereal, bran flakes, cup (120-150 mg).  Cheese, cottage,  cup (110 mg).  Cherries, 10 each (150 mg).  Chocolate, 1 oz bar (165 mg).  Coffee, brewed 6 oz (90 mg).  Corn,  cup or 1 ear (195 mg).  Cucumbers,  cup (80 mg).  Egg, large, 1 each (60 mg).  Eggplant,  cup (60 mg).  Endive, raw, cup (80 mg).  English muffin, 1 each (65 mg).  Fish, orange roughy, 3 oz (150 mg).  Frankfurter, beef or pork, 1 each (75 mg).  Fruit cocktail,  cup (115 mg).  Grape juice,  cup (170 mg).  Grapefruit,  fruit (175 mg).  Grapes,  cup (155 mg).  Greens: kale, turnip, collard,  cup (110-150 mg).  Ice cream or frozen yogurt, chocolate,  cup (175 mg).  Ice cream or frozen yogurt, vanilla,  cup (120-150 mg).  Lemons, limes, 1 each (80 mg).  Lettuce, all types, 1 cup (100 mg).  Mixed vegetables,  cup (150 mg).  Mushrooms, raw,  cup (110 mg).  Nuts: walnuts, pecans, or macadamia, 1 oz (125 mg).  Oatmeal,  cup (80 mg).  Okra,  cup (110 mg).  Onions, raw,  cup (120 mg).  Peach, 1 each (185 mg).  Peaches, canned,   cup (120 mg).  Pears, canned,  cup (120 mg).  Peas, green, frozen,  cup (90 mg).  Peppers, green,  cup (130 mg).  Peppers, red,  cup (160 mg).  Pineapple juice,  cup (165 mg).  Pineapple, fresh or canned,  cup (100 mg).  Plums, 1 each (105 mg).  Pudding, vanilla,  cup (150 mg).  Raspberries,  cup (90 mg).  Rhubarb,  cup (115 mg).  Rice, wild,  cup (80 mg).  Shrimp, 3 oz (155 mg).  Spinach, raw, 1 cup (170 mg).  Strawberries,  cup (125 mg).  Summer squash  cup (175-200 mg).  Swiss chard, raw, 1 cup (135 mg).  Tangerines, 1 each (140 mg).  Tea, brewed, 6 oz (65 mg).  Turnips,  cup (140 mg).  Watermelon,  cup (85 mg).  Wine, red, table, 5 oz (180 mg).  Wine, white, table, 5 oz (100 mg). LOW IN POTASSIUM The following foods and beverages have less than 50 mg of potassium per serving.  Bread, white, 1 slice (30 mg).  Carbonated beverages, 12 oz (less than 5 mg).  Cheese, 1 oz (20-30 mg).  Cranberries,  cup (45 mg).  Cranberry juice cocktail,  cup (20 mg).  Fats and oils, 1 Tbsp (less than 5 mg).  Hummus, 1 Tbsp (32 mg).  Nectar: papaya, mango, or pear,  cup (35 mg).  Rice, white or brown,  cup (50 mg).  Spaghetti or macaroni,  cup cooked (30 mg).  Tortilla, flour or corn, 1 each (50 mg).  Waffle, 4 in., 1 each (50 mg).  Water chestnuts,  cup (40 mg). Document Released: 01/12/2005 Document Revised: 06/05/2013 Document Reviewed: 04/27/2013 [facility] Patient Information 2015 [place], [place]. This information is not intended to replace advice given to you by your health care provider. Make sure you discuss any questions you have with your health care provider.

## 2013-12-24 ENCOUNTER — Telehealth: Payer: Self-pay

## 2013-12-24 DIAGNOSIS — F329 Major depressive disorder, single episode, unspecified: Secondary | ICD-10-CM

## 2013-12-24 DIAGNOSIS — F32A Depression, unspecified: Secondary | ICD-10-CM

## 2013-12-24 MED ORDER — AZITHROMYCIN 500 MG PO TABS: 500.0000 mg | ORAL_TABLET | Freq: Every day | ORAL | Status: DC

## 2013-12-24 MED ORDER — EFFEXOR XR 75 MG PO CP24: 75.0000 mg | ORAL_CAPSULE | Freq: Two times a day (BID) | ORAL | Status: DC

## 2013-12-24 NOTE — Telephone Encounter (Signed)
Had to resend the Effexor. She is wondering why she didn't get anything for her sinus infection. Can this be called in?

## 2013-12-24 NOTE — Telephone Encounter (Signed)
Spoke with Dr. Brigitte Pulse. This has been called in. Pt notified. Pt very upset about medication not being sent in. Requests to not see Dr. Brigitte Pulse anymore.

## 2013-12-24 NOTE — Telephone Encounter (Signed)
PT STATES SHE WAS SEEN YESTERDAY AND ALL HER MEDICINES WERE CALLED IN WRONG, WAS ALSO TO GET A Z-PAK OR AN ANTIBIOTIC , WAS TO HAVE THE GENERIC PERCOCET 5-325MG  BUT WAS GIVEN THE NAME BRAND, WAS TO GET THE REGULAR IN EFFEXOR XR 75MG S, BUT WAS GIVEN THE GENERIC. PLEASE CALL PT AT [phone]     [facility]

## 2013-12-24 NOTE — Telephone Encounter (Signed)
Patient states she came in on Sunday 12/23/13 and was given the wrong medication prescription and she would love to get a z-pack script. Please advise at [phone]

## 2013-12-24 NOTE — Telephone Encounter (Signed)
Very sorry. Tried to comply with her exact specifications on her medications to my understanding but sorry. I'm glad she is coming in to see her PCP Dr. Joseph Art later this week to get it straightened out.  Lack of rx for azithromycin an oversight on my part - I did mean to prescribe it to her - sent in now within 24 hrs of her visit. (pt requested 500 qd x 5d so done per her wishes)

## 2013-12-28 ENCOUNTER — Ambulatory Visit (INDEPENDENT_AMBULATORY_CARE_PROVIDER_SITE_OTHER): Payer: No Typology Code available for payment source | Admitting: Family Medicine

## 2013-12-28 VITALS — BP 132/84 | HR 87 | Temp 98.2°F | Resp 16 | Ht 65.5 in | Wt 150.2 lb

## 2013-12-28 DIAGNOSIS — J0100 Acute maxillary sinusitis, unspecified: Secondary | ICD-10-CM

## 2013-12-28 DIAGNOSIS — E876 Hypokalemia: Secondary | ICD-10-CM

## 2013-12-28 DIAGNOSIS — I10 Essential (primary) hypertension: Secondary | ICD-10-CM

## 2013-12-28 DIAGNOSIS — J0101 Acute recurrent maxillary sinusitis: Secondary | ICD-10-CM

## 2013-12-28 DIAGNOSIS — F329 Major depressive disorder, single episode, unspecified: Secondary | ICD-10-CM

## 2013-12-28 DIAGNOSIS — M545 Low back pain, unspecified: Secondary | ICD-10-CM

## 2013-12-28 DIAGNOSIS — J40 Bronchitis, not specified as acute or chronic: Secondary | ICD-10-CM

## 2013-12-28 DIAGNOSIS — J329 Chronic sinusitis, unspecified: Secondary | ICD-10-CM

## 2013-12-28 DIAGNOSIS — E785 Hyperlipidemia, unspecified: Secondary | ICD-10-CM

## 2013-12-28 DIAGNOSIS — Z0000 Encounter for general adult medical examination without abnormal findings: Secondary | ICD-10-CM

## 2013-12-28 DIAGNOSIS — K219 Gastro-esophageal reflux disease without esophagitis: Secondary | ICD-10-CM

## 2013-12-28 DIAGNOSIS — E042 Nontoxic multinodular goiter: Secondary | ICD-10-CM

## 2013-12-28 DIAGNOSIS — F3289 Other specified depressive episodes: Secondary | ICD-10-CM

## 2013-12-28 DIAGNOSIS — F32A Depression, unspecified: Secondary | ICD-10-CM

## 2013-12-28 DIAGNOSIS — R52 Pain, unspecified: Secondary | ICD-10-CM

## 2013-12-28 LAB — COMPLETE METABOLIC PANEL WITH GFR
ALT: 72 U/L — ABNORMAL HIGH (ref 0–35)
AST: 43 U/L — ABNORMAL HIGH (ref 0–37)
Albumin: 4.2 g/dL (ref 3.5–5.2)
Alkaline Phosphatase: 98 U/L (ref 39–117)
BUN: 12 mg/dL (ref 6–23)
CO2: 31 mEq/L (ref 19–32)
Calcium: 10 mg/dL (ref 8.4–10.5)
Chloride: 94 mEq/L — ABNORMAL LOW (ref 96–112)
Creat: 0.63 mg/dL (ref 0.50–1.10)
GFR, Est African American: >89 mL/min
GFR, Est Non African American: >89 mL/min
Glucose, Bld: 93 mg/dL (ref 70–99)
Potassium: 3.9 mEq/L (ref 3.5–5.3)
Sodium: 134 mEq/L — ABNORMAL LOW (ref 135–145)
Total Bilirubin: 0.8 mg/dL (ref 0.2–1.2)
Total Protein: 7.8 g/dL (ref 6.0–8.3)

## 2013-12-28 LAB — POCT URINALYSIS DIPSTICK
Bilirubin, UA: NEGATIVE
Blood, UA: NEGATIVE
Glucose, UA: NEGATIVE
Ketones, UA: NEGATIVE
Leukocytes, UA: NEGATIVE
Nitrite, UA: NEGATIVE
Protein, UA: NEGATIVE
Spec Grav, UA: 1.015
Urobilinogen, UA: 0.2
pH, UA: 8.5

## 2013-12-28 LAB — LIPID PANEL
Cholesterol: 250 mg/dL — ABNORMAL HIGH (ref 0–200)
HDL: 71 mg/dL (ref >39)
LDL Cholesterol: 158 mg/dL — ABNORMAL HIGH (ref 0–99)
Total CHOL/HDL Ratio: 3.5 Ratio
Triglycerides: 107 mg/dL (ref <150)
VLDL: 21 mg/dL (ref 0–40)

## 2013-12-28 LAB — POCT UA - MICROSCOPIC ONLY
Bacteria, U Microscopic: NEGATIVE
Casts, Ur, LPF, POC: NEGATIVE
Crystals, Ur, HPF, POC: NEGATIVE
Mucus, UA: NEGATIVE
RBC, urine, microscopic: NEGATIVE
WBC, Ur, HPF, POC: NEGATIVE
Yeast, UA: NEGATIVE

## 2013-12-28 LAB — CBC
HCT: 41.3 % (ref 36.0–46.0)
Hemoglobin: 14.6 g/dL (ref 12.0–15.0)
MCH: 33.4 pg (ref 26.0–34.0)
MCHC: 35.4 g/dL (ref 30.0–36.0)
MCV: 94.5 fL (ref 78.0–100.0)
Platelets: 295 10*3/uL (ref 150–400)
RBC: 4.37 MIL/uL (ref 3.87–5.11)
RDW: 13.3 % (ref 11.5–15.5)
WBC: 11.5 10*3/uL — ABNORMAL HIGH (ref 4.0–10.5)

## 2013-12-28 MED ORDER — POTASSIUM CHLORIDE ER 10 MEQ PO TBCR: 10.0000 meq | EXTENDED_RELEASE_TABLET | Freq: Every day | ORAL | Status: DC

## 2013-12-28 MED ORDER — OXYCODONE-ACETAMINOPHEN 5-325 MG PO TABS
1.0000 | ORAL_TABLET | Freq: Three times a day (TID) | ORAL | Status: DC | PRN
Start: 2013-12-28 — End: 2014-09-11

## 2013-12-28 MED ORDER — ALPRAZOLAM 0.5 MG PO TABS: 0.5000 mg | ORAL_TABLET | Freq: Three times a day (TID) | ORAL | Status: DC | PRN

## 2013-12-28 MED ORDER — LANSOPRAZOLE 30 MG PO CPDR: 30.0000 mg | DELAYED_RELEASE_CAPSULE | Freq: Every day | ORAL | Status: DC

## 2013-12-28 MED ORDER — CYCLOBENZAPRINE HCL 10 MG PO TABS: ORAL_TABLET | ORAL | Status: DC

## 2013-12-28 MED ORDER — AZITHROMYCIN 500 MG PO TABS: 500.0000 mg | ORAL_TABLET | Freq: Every day | ORAL | Status: DC

## 2013-12-28 MED ORDER — EFFEXOR XR 75 MG PO CP24
75.0000 mg | ORAL_CAPSULE | Freq: Two times a day (BID) | ORAL | Status: DC
Start: 2013-12-28 — End: 2015-04-13

## 2013-12-28 MED ORDER — MONTELUKAST SODIUM 10 MG PO TABS: ORAL_TABLET | ORAL | Status: DC

## 2013-12-28 MED ORDER — FLUTICASONE-SALMETEROL 100-50 MCG/DOSE IN AEPB: 1.0000 | INHALATION_SPRAY | Freq: Two times a day (BID) | RESPIRATORY_TRACT | Status: DC

## 2013-12-28 MED ORDER — TRIAMTERENE-HCTZ 75-50 MG PO TABS: 1.0000 | ORAL_TABLET | Freq: Every day | ORAL | Status: DC

## 2013-12-28 MED ORDER — ROSUVASTATIN CALCIUM 20 MG PO TABS: 20.0000 mg | ORAL_TABLET | Freq: Every day | ORAL | Status: DC

## 2013-12-28 NOTE — Progress Notes (Signed)
Subjective:    [name], female    DOB: 1951-02-23, 63 y.o.   MRN: [id number] This chart was scribed for Miranda Haber, MD by Miranda Cochran, ED Scribe at [facility]. This patient was seen in room Room 9 and the patient's care was started at 11:19 AM.    [name] MRN: [id number], DOB: 1950/07/07, 63 y.o. Date of Encounter: 12/28/2013, 11:19 AM  Primary Physician: Miranda Haber, MD  Chief Complaint: Annual Exam   HPI: 63 y.o. year old female with history below presents for her annual exam, med refill of all of her meds and hoarse voice with associated cough.  Persistent Cold Sx:  Pt reports she was seen at the clinic by Dr. Brigitte Cochran on 12/25/13 for scratchy throat, postnasal drip, cough, fatigue, and epistaxis. Pt reports that her cough persists, fatigue, and hoarse voice. However, pt denies sore throat.   Personal Hx: Pt reports she has been very busy at work and states that she plans to retire in approximately 4-5 years.   Cataracts: Pt reports that she had cataract surgery and her vision has been improved since.   Mammogram Results:  Pt had her annual mammogram earlier this month which showed hyperdense breast tissue in the URQ of the right breast and she is scheduled for a biopsy next week to determine if she has DCIS.   Meds: Pt reports she has not been taking her Crestor because she has been out of the med for the past 2 weeks.   Med Allergies:  Pt reports myalgia with use of simvastatin.   Pt further reports that she cannot take Klor-Con as it causes her to have diarrhea and it is difficult for her to swallow the pills due to their size.   Past Medical History  Diagnosis Date  . Sinusitis, chronic   . Hypertension   . Hyperlipidemia   . Colon polyps   . Vitamin D deficiency   . Hiatal hernia   . Allergy   . Anxiety   . Asthma   . Depression   . GERD (gastroesophageal reflux disease)     Home Meds: Prior to  Admission medications   Medication Sig Start Date End Date Taking? Authorizing Provider  ALPRAZolam Miranda Cochran) 0.5 MG tablet take 1 tablet by mouth three times a day if needed for sleep   Yes Miranda Haber, MD  aspirin 325 MG tablet Take 325 mg by mouth daily as needed.    Yes Historical Provider, MD  azelastine (OPTIVAR) 0.05 % ophthalmic solution apply 1 drop into affected eye daily   Yes Miranda Haber, MD  azithromycin (ZITHROMAX) 500 MG tablet Take 1 tablet (500 mg total) by mouth daily. 12/24/13  Yes Miranda Knapp, MD  cetirizine (ZYRTEC) 10 MG tablet Take 10 mg by mouth daily.   Yes Historical Provider, MD  EFFEXOR XR 75 MG 24 hr capsule Take 1 capsule (75 mg total) by mouth 2 (two) times daily with a meal. 12/24/13  Yes Miranda Knapp, MD  Fluticasone-Salmeterol (ADVAIR) 100-50 MCG/DOSE AEPB Inhale 1 puff into the lungs 2 (two) times daily. 12/23/13  Yes Miranda Knapp, MD  ketoprofen (ORUDIS) 75 MG capsule Take 1 capsule (75 mg total) by mouth 3 (three) times daily as needed for pain. 10/04/12  Yes Miranda Haber, MD  lansoprazole (PREVACID) 30 MG capsule Take 1 capsule (30 mg total) by mouth daily. 12/23/13  Yes Miranda Knapp, MD  montelukast (SINGULAIR)  10 MG tablet take 1 tablet by mouth at bedtime 12/23/13  Yes Miranda Knapp, MD  oxyCODONE-acetaminophen (PERCOCET/ROXICET) 5-325 MG per tablet Take 1 tablet by mouth every 8 (eight) hours as needed. 04/19/13  Yes Miranda Haber, MD  rosuvastatin (CRESTOR) 20 MG tablet Take 1 tablet (20 mg total) by mouth daily. 12/23/13  Yes Miranda Knapp, MD  triamterene-hydrochlorothiazide (MAXZIDE) 75-50 MG per tablet Take 1 tablet by mouth daily. 12/23/13  Yes Miranda Knapp, MD  cyclobenzaprine (FLEXERIL) 10 MG tablet Prn hs 04/19/13   Miranda Haber, MD  potassium chloride (K-DUR) 10 MEQ tablet Take 1 tablet (10 mEq total) by mouth daily. 12/23/13   Miranda Knapp, MD   Allergies:  Allergies  Allergen Reactions  . Levofloxacin     hallucinations  . Prednisone     Anxiety and  paranoia  . Amoxicillin Other (See Comments)    diarrhea  . Augmentin [Amoxicillin-Pot Clavulanate] Other (See Comments)    diarrhea   History   Social History  . Marital Status: Divorced    Spouse Name: N/A    Number of Children: N/A  . Years of Education: N/A   Occupational History  . Not on file.   Social History Main Topics  . Smoking status: Passive Smoke Exposure - Never Smoker    Types: Cigarettes  . Smokeless tobacco: Not on file  . Alcohol Use: 1.0 oz/week    2 drink(s) per week     Comment: 1-3/week  . Drug Use: No  . Sexual Activity: Yes   Other Topics Concern  . Not on file   Social History Narrative  . No narrative on file   Review of Systems: Constitutional: negative for chills, fever, night sweats, weight changes, or fatigue  HEENT: negative for vision changes, hearing loss, congestion, rhinorrhea, ST, epistaxis, or sinus pressure. Positive for hoarse voice.  Cardiovascular: negative for chest pain or palpitations Respiratory: negative for hemoptysis, wheezing, shortness of breath. Positive for intermittent cough.  Abdominal: negative for abdominal pain, nausea, vomiting, diarrhea, or constipation Dermatological: negative for rash Neurologic: negative for headache, dizziness, or syncope All other systems reviewed and are otherwise negative with the exception to those above and in the HPI.  Physical Exam: Blood pressure 132/84, Cochran 87, temperature 98.2 F (36.8 C), temperature source Oral, resp. rate 16, height 5' 5.5" (1.664 m), weight 150 lb 3.2 oz (68.13 kg), SpO2 99.00%., Body mass index is 24.61 kg/(m^2). General: Well developed, well nourished, in no acute distress. Head: Normocephalic, atraumatic, eyes without discharge, sclera non-icteric, nares are without discharge. Bilateral auditory canals clear, TM's are without perforation, pearly grey and translucent with reflective cone of light bilaterally. Oral cavity moist, posterior pharynx without  exudate, erythema, peritonsillar abscess, or post nasal drip. Nasal swelling bilaterally  Neck: Supple. No thyromegaly. Full ROM. No lymphadenopathy. Lungs: Clear bilaterally to auscultation without wheezes, rales, or rhonchi. Breathing is unlabored. Heart: RRR with S1 S2. No murmurs, rubs, or gallops appreciated. Abdomen: Soft, non-tender, non-distended with normoactive bowel sounds. No hepatomegaly. No rebound/guarding. No obvious abdominal masses. Msk:  Strength and tone normal for age. Extremities/Skin: Warm and dry. No clubbing or cyanosis. No edema. No rashes or suspicious lesions. Neuro: Alert and oriented X 3. Moves all extremities spontaneously. Gait is normal. CNII-XII grossly in tact. Psych:  Responds to questions appropriately with a normal affect.   Labs: Results for orders placed in visit on 12/28/13  POCT URINALYSIS DIPSTICK      Result Value Ref  Range   Color, UA yellow     Clarity, UA clear     Glucose, UA neg     Bilirubin, UA neg     Ketones, UA neg     Spec Grav, UA 1.015     Blood, UA neg     pH, UA 8.5     Protein, UA neg     Urobilinogen, UA 0.2     Nitrite, UA neg     Leukocytes, UA Negative    POCT UA - MICROSCOPIC ONLY      Result Value Ref Range   WBC, Ur, HPF, POC neg     RBC, urine, microscopic neg     Bacteria, U Microscopic neg     Mucus, UA neg     Epithelial cells, urine per micros 0-3     Crystals, Ur, HPF, POC neg     Casts, Ur, LPF, POC neg     Yeast, UA neg        ASSESSMENT AND PLAN:  63 y.o. year old female with Routine general medical examination at a health care facility - Plan: CBC, POCT urinalysis dipstick, POCT UA - Microscopic Only, Lipid panel, COMPLETE METABOLIC PANEL WITH GFR, TSH, Pap IG (Image Guided)  Unspecified sinusitis (chronic) - Plan: CBC, POCT urinalysis dipstick, POCT UA - Microscopic Only, Lipid panel, COMPLETE METABOLIC PANEL WITH GFR, TSH, Pap IG (Image Guided), azithromycin (ZITHROMAX) 500 MG tablet  Goiter,  nontoxic, multinodular - Plan: CBC, POCT urinalysis dipstick, POCT UA - Microscopic Only, Lipid panel, COMPLETE METABOLIC PANEL WITH GFR, TSH, Pap IG (Image Guided)  Hyperlipidemia - Plan: rosuvastatin (CRESTOR) 20 MG tablet  Hypokalemia - Plan: potassium chloride (K-DUR) 10 MEQ tablet  Recurrent maxillary sinusitis, unspecified chronicity - Plan: oxyCODONE-acetaminophen (PERCOCET/ROXICET) 5-325 MG per tablet  Body aches - Plan: oxyCODONE-acetaminophen (PERCOCET/ROXICET) 5-325 MG per tablet  Annual physical exam - Plan: oxyCODONE-acetaminophen (PERCOCET/ROXICET) 5-325 MG per tablet, cyclobenzaprine (FLEXERIL) 10 MG tablet  Essential hypertension - Plan: triamterene-hydrochlorothiazide (MAXZIDE) 75-50 MG per tablet  Midline low back pain without sciatica - Plan: cyclobenzaprine (FLEXERIL) 10 MG tablet  Gastroesophageal reflux disease, esophagitis presence not specified - Plan: lansoprazole (PREVACID) 30 MG capsule  Bronchitis - Plan: montelukast (SINGULAIR) 10 MG tablet, Fluticasone-Salmeterol (ADVAIR) 100-50 MCG/DOSE AEPB  Depression - Plan: EFFEXOR XR 75 MG 24 hr capsule, ALPRAZolam (XANAX) 0.5 MG tablet          Signed, Miranda Haber, MD 12/28/2013 11:19 AM                               HPI    Review of Systems     Objective:   Physical Exam        Assessment & Plan:

## 2013-12-28 NOTE — Patient Instructions (Signed)

## 2013-12-29 LAB — TSH: TSH: 1.657 u[IU]/mL (ref 0.350–4.500)

## 2013-12-31 LAB — PAP IG (IMAGE GUIDED)

## 2014-01-01 ENCOUNTER — Telehealth: Payer: Self-pay

## 2014-01-01 NOTE — Telephone Encounter (Signed)
PA was completed on 12/21/13 for pt's Crestor on covermymeds. Received new fax from ins w/add'l info and completed these questions also and faxed back.

## 2014-01-02 ENCOUNTER — Ambulatory Visit
Admission: RE | Admit: 2014-01-02 | Discharge: 2014-01-02 | Disposition: A | Discharge status: Home / Self Care | Payer: PRIVATE HEALTH INSURANCE | Source: Ambulatory Visit | Attending: Family Medicine | Admitting: Family Medicine

## 2014-01-02 DIAGNOSIS — R921 Mammographic calcification found on diagnostic imaging of breast: Secondary | ICD-10-CM

## 2014-01-08 NOTE — Telephone Encounter (Signed)
PA approved through 01/05/15. Notified pharm.

## 2014-01-21 ENCOUNTER — Other Ambulatory Visit: Payer: Self-pay | Admitting: Family Medicine

## 2014-01-22 ENCOUNTER — Telehealth: Payer: Self-pay

## 2014-01-22 NOTE — Telephone Encounter (Signed)
I received fax from [facility] that ketoprofen is backordered. I called pt and she stated this is the only thing that has worked for her HAs. Recommended that pt call a few other pharmacies to see if they have any in stock. Pt agreed and will CB if she needs me to have Dr L send in a different medication if she is unable to find.

## 2014-02-24 ENCOUNTER — Other Ambulatory Visit: Payer: Self-pay | Admitting: Family Medicine

## 2014-03-20 ENCOUNTER — Encounter: Payer: Self-pay | Admitting: Internal Medicine

## 2014-03-27 ENCOUNTER — Telehealth: Payer: Self-pay | Admitting: *Deleted

## 2014-03-27 NOTE — Telephone Encounter (Signed)
Called spoke to patient about following up on her cholesterol with Dr Joseph Art. Patient stated since her cholesterol results were fine on her last labs, she should not have to come in for a follow up.  Before her last visit she had stopped taking the cholesterol medication and labs were normal. Patient understood.

## 2014-05-17 ENCOUNTER — Encounter: Payer: Self-pay | Admitting: Gastroenterology

## 2014-05-21 ENCOUNTER — Encounter: Payer: Self-pay | Admitting: Internal Medicine

## 2014-05-21 ENCOUNTER — Ambulatory Visit (INDEPENDENT_AMBULATORY_CARE_PROVIDER_SITE_OTHER): Payer: PRIVATE HEALTH INSURANCE | Admitting: Internal Medicine

## 2014-05-21 VITALS — BP 108/74 | HR 88 | Ht 66.0 in | Wt 148.2 lb

## 2014-05-21 DIAGNOSIS — K589 Irritable bowel syndrome without diarrhea: Secondary | ICD-10-CM

## 2014-05-21 DIAGNOSIS — Z800 Family history of malignant neoplasm of digestive organs: Secondary | ICD-10-CM

## 2014-05-21 DIAGNOSIS — Z86010 Personal history of colonic polyps: Secondary | ICD-10-CM

## 2014-05-21 MED ORDER — SOD PICOSULFATE-MAG OX-CIT ACD 10-3.5-12 MG-GM-GM PO PACK: 1.0000 | PACK | Freq: Once | ORAL | Status: DC

## 2014-05-21 MED ORDER — PROMETHAZINE HCL 25 MG PO TABS
25.0000 mg | ORAL_TABLET | Freq: Four times a day (QID) | ORAL | Status: DC | PRN
Start: 2014-05-21 — End: 2015-04-30

## 2014-05-21 NOTE — Progress Notes (Signed)
   Subjective:    [name], female    DOB: 1951/03/21, 63 y.o.   MRN: [id number]  HPI Kisha is here for f/u of IBS - overall stable with intermittent spells of abdominal pain, nausea, vomiting and diarrhea. Stress witll induce and sometimes post-prandial but no consistent triggers. Happens several times a year and is very self-limited. May go months between episodes. Similar to pattern over the years. Effexor use has improved frequency.  She had adenoma removed 06/2009 and is due for f/u colonoscopy, would like to schedule before end of year. Father also had colon cancer in 46's.  GI ROS o/w negative.  Medications, allergies, past medical history, past surgical history, family history and social history are reviewed and updated in the EMR.  Review of Systems + job stress - new computer system, allergies, rginitis, anxiety, fatigue, headaches. All other ROS negative or per HPI    Objective:   Physical Exam General:  Well-developed, well-nourished and in no acute distress Eyes:  anicteric. ENT:   Mouth and posterior pharynx free of lesions.  Neck:   supple w/o thyromegaly or mass.  Lungs: Clear to auscultation bilaterally. Heart:  S1S2, no rubs, murmurs, gallops. Abdomen:  soft, non-tender, no hepatosplenomegaly, hernia, or mass and BS+.  Rectal: deferred Lymph:  no cervical or supraclavicular adenopathy. Extremities:   no edema Skin   no rash. Neuro:  A&O x 3.  Psych:  appropriate mood and  Affect.   Data Reviewed: Prior GI notes, colonoscopy    Assessment & Plan:  Hx of adenomatous polyp of colon - Plan: Ambulatory referral to Gastroenterology  Family history of colon cancer - Plan: Ambulatory referral to Gastroenterology  IBS (irritable bowel syndrome)  1. Prn promethazine Rx 2. Colonoscopy - polyp surveillance/FHx CRCA The risks and benefits as well as alternatives of endoscopic procedure(s) have been discussed and reviewed. All questions answered. The  patient agrees to proceed.

## 2014-05-21 NOTE — Patient Instructions (Addendum)
   You will have a colonoscopy using Prep-o-Pik for the cleanout. The procedure is being done because of your history of colon polyp and family history of colon cancer. You have been scheduled for a colonoscopy. Please follow written instructions given to you at your visit today.  Please pick up your prep kit at the pharmacy within the next 1-3 days. If you use inhalers (even only as needed), please bring them with you on the day of your procedure. Your physician has requested that you go to www.startemmi.com and enter the access code given to you at your visit today. This web site gives a general overview about your procedure. However, you should still follow specific instructions given to you by our office regarding your preparation for the procedure.   I have prescribed promethazine to have on hand when you have nausea and vomiting with your IBS.   I appreciate the opportunity to care for you. Gatha Mayer, MD, [facility]

## 2014-05-23 ENCOUNTER — Encounter: Payer: Self-pay | Admitting: Internal Medicine

## 2014-05-23 ENCOUNTER — Ambulatory Visit (AMBULATORY_SURGERY_CENTER): Payer: PRIVATE HEALTH INSURANCE | Admitting: Internal Medicine

## 2014-05-23 VITALS — BP 126/77 | HR 81 | Temp 98.0°F | Resp 16 | Ht 66.0 in | Wt 148.0 lb

## 2014-05-23 DIAGNOSIS — D123 Benign neoplasm of transverse colon: Secondary | ICD-10-CM

## 2014-05-23 DIAGNOSIS — Z86010 Personal history of colonic polyps: Secondary | ICD-10-CM

## 2014-05-23 HISTORY — PX: COLONOSCOPY: SHX174

## 2014-05-23 MED ORDER — SODIUM CHLORIDE 0.9 % IV SOLN: 500.0000 mL | INTRAVENOUS | Status: DC

## 2014-05-23 NOTE — Op Note (Signed)
[facility]  [address] [place], [id number]   COLONOSCOPY PROCEDURE REPORT  PATIENT: Miranda Cochran, Miranda Cochran  MR#: [id number] BIRTHDATE: 13-Dec-1950 , 61  yrs. old GENDER: female ENDOSCOPIST: Gatha Mayer, MD, [facility] PROCEDURE DATE:  05/23/2014 PROCEDURE:   Colonoscopy with snare polypectomy First Screening Colonoscopy - Avg.  risk and is 50 yrs.  old or older - No.  Prior Negative Screening - Now for repeat screening. N/A  History of Adenoma - Now for follow-up colonoscopy & has been > or = to 3 yrs.  Yes hx of adenoma.  Has been 3 or more years since last colonoscopy.  Polyps Removed Today? Yes. ASA CLASS:   Class II INDICATIONS:surveillance colonoscopy based on a history of adenomatous colonic polyp(s) and patient's immediate family history of colon cancer. MEDICATIONS: Propofol 260 mg IV and Monitored anesthesia care  DESCRIPTION OF PROCEDURE:   After the risks benefits and alternatives of the procedure were thoroughly explained, informed consent was obtained.  The digital rectal exam revealed no abnormalities of the rectum.   The LB [id number]  endoscope was introduced through the anus and advanced to the cecum, which was identified by both the appendix and ileocecal valve. No adverse events experienced.   The quality of the prep was good, using MiraLax  The instrument was then slowly withdrawn as the colon was fully examined.      COLON FINDINGS: 1) 6-7 mm sessile transverse polyp removed by cold snare and sent to pathology 2) Otherwise normal colon mucosa. Retroflexed views revealed no abnormalities. The time to cecum=6 minutes 02 seconds.  Withdrawal time=9 minutes 26 seconds.  The scope was withdrawn and the procedure completed. COMPLICATIONS: There were no immediate complications.  ENDOSCOPIC IMPRESSION: 1) 6-7 mm sessile transverse polyp removed 2) Otherwise normal colon mucosa  RECOMMENDATIONS: Timing of repeat colonoscopy will be determined  by pathology findings in this patient with prior adenoma 2011 and FHx colon cancer  eSigned:  Gatha Mayer, MD, [facility] 05/23/2014 4:47 PM   cc: The Patient

## 2014-05-23 NOTE — Assessment & Plan Note (Addendum)
06/2009 - single adenoma  05/23/2014 6-7 mm transverse polyp removed

## 2014-05-23 NOTE — Patient Instructions (Addendum)
I found and removed one small polyp. I will let you know pathology results and when to have another routine colonoscopy by mail. Probably 5 years again.  I appreciate the opportunity to care for you. Gatha Mayer, MD, FACG  YOU HAD AN ENDOSCOPIC PROCEDURE TODAY AT [facility]: Refer to the procedure report that was given to you for any specific questions about what was found during the examination.  If the procedure report does not answer your questions, please call your gastroenterologist to clarify.  If you requested that your care partner not be given the details of your procedure findings, then the procedure report has been included in a sealed envelope for you to review at your convenience later.  YOU SHOULD EXPECT: Some feelings of bloating in the abdomen. Passage of more gas than usual.  Walking can help get rid of the air that was put into your GI tract during the procedure and reduce the bloating. If you had a lower endoscopy (such as a colonoscopy or flexible sigmoidoscopy) you may notice spotting of blood in your stool or on the toilet paper. If you underwent a bowel prep for your procedure, then you may not have a normal bowel movement for a few days.  DIET: Your first meal following the procedure should be a light meal and then it is ok to progress to your normal diet.  A half-sandwich or bowl of soup is an example of a good first meal.  Heavy or fried foods are harder to digest and may make you feel nauseous or bloated.  Likewise meals heavy in dairy and vegetables can cause extra gas to form and this can also increase the bloating.  Drink plenty of fluids but you should avoid alcoholic beverages for 24 hours.  ACTIVITY: Your care partner should take you home directly after the procedure.  You should plan to take it easy, moving slowly for the rest of the day.  You can resume normal activity the day after the procedure however you should NOT DRIVE or use heavy  machinery for 24 hours (because of the sedation medicines used during the test).    SYMPTOMS TO REPORT IMMEDIATELY: A gastroenterologist can be reached at any hour.  During normal business hours, 8:30 AM to 5:00 PM Monday through Friday, call [phone].  After hours and on weekends, please call the GI answering service at [phone] who will take a message and have the physician on call contact you.   Following lower endoscopy (colonoscopy or flexible sigmoidoscopy):  Excessive amounts of blood in the stool  Significant tenderness or worsening of abdominal pains  Swelling of the abdomen that is new, acute  Fever of 100F or higher  FOLLOW UP: If any biopsies were taken you will be contacted by phone or by letter within the next 1-3 weeks.  Call your gastroenterologist if you have not heard about the biopsies in 3 weeks.  Our staff will call the home number listed on your records the next business day following your procedure to check on you and address any questions or concerns that you may have at that time regarding the information given to you following your procedure. This is a courtesy call and so if there is no answer at the home number and we have not heard from you through the emergency physician on call, we will assume that you have returned to your regular daily activities without incident.  SIGNATURES/CONFIDENTIALITY: You and/or your care partner  have signed paperwork which will be entered into your electronic medical record.  These signatures attest to the fact that that the information above on your After Visit Summary has been reviewed and is understood.  Full responsibility of the confidentiality of this discharge information lies with you and/or your care-partner.

## 2014-05-23 NOTE — Progress Notes (Signed)
Report to PACU, RN, vss, BBS= Clear.  

## 2014-05-23 NOTE — Progress Notes (Signed)
Called to room to assist during endoscopic procedure.  Patient ID and intended procedure confirmed with present staff. Received instructions for my participation in the procedure from the performing physician.  

## 2014-05-24 ENCOUNTER — Telehealth: Payer: Self-pay | Admitting: *Deleted

## 2014-05-24 NOTE — Telephone Encounter (Signed)
  Follow up Call-  Call back number 05/23/2014  Post procedure Call Back phone  # [phone]  Permission to leave phone message Yes     Patient questions:  Do you have a fever, pain , or abdominal swelling? No. Pain Score  0 *  Have you tolerated food without any problems? Yes.    Have you been able to return to your normal activities? Yes.    Do you have any questions about your discharge instructions: Diet   No. Medications  No. Follow up visit  No.  Do you have questions or concerns about your Care? No.  Actions: * If pain score is 4 or above: No action needed, pain <4.

## 2014-05-28 ENCOUNTER — Encounter: Payer: Self-pay | Admitting: Internal Medicine

## 2014-05-28 NOTE — Progress Notes (Signed)
Quick Note:  6-7 mm adenoma Repeat colonoscopy 2020/1 ______

## 2014-06-23 ENCOUNTER — Other Ambulatory Visit: Payer: Self-pay | Admitting: Family Medicine

## 2014-07-23 ENCOUNTER — Other Ambulatory Visit: Payer: Self-pay | Admitting: Family Medicine

## 2014-07-24 NOTE — Telephone Encounter (Signed)
Faxed

## 2014-08-10 ENCOUNTER — Other Ambulatory Visit: Payer: Self-pay | Admitting: Family Medicine

## 2014-08-11 NOTE — Telephone Encounter (Signed)
Do you want to give RFs?

## 2014-08-14 ENCOUNTER — Other Ambulatory Visit: Payer: Self-pay | Admitting: Family Medicine

## 2014-09-11 ENCOUNTER — Ambulatory Visit (INDEPENDENT_AMBULATORY_CARE_PROVIDER_SITE_OTHER): Payer: No Typology Code available for payment source | Admitting: Family Medicine

## 2014-09-11 VITALS — BP 120/78 | HR 86 | Temp 98.6°F | Resp 17 | Ht 65.5 in | Wt 145.0 lb

## 2014-09-11 DIAGNOSIS — R52 Pain, unspecified: Secondary | ICD-10-CM

## 2014-09-11 DIAGNOSIS — J301 Allergic rhinitis due to pollen: Secondary | ICD-10-CM

## 2014-09-11 DIAGNOSIS — I10 Essential (primary) hypertension: Secondary | ICD-10-CM | POA: Diagnosis not present

## 2014-09-11 DIAGNOSIS — J0101 Acute recurrent maxillary sinusitis: Secondary | ICD-10-CM | POA: Diagnosis not present

## 2014-09-11 DIAGNOSIS — G4700 Insomnia, unspecified: Secondary | ICD-10-CM

## 2014-09-11 DIAGNOSIS — Z0000 Encounter for general adult medical examination without abnormal findings: Secondary | ICD-10-CM

## 2014-09-11 LAB — POCT CBC
Granulocyte percent: 58.7 %G (ref 37–80)
HCT, POC: 46.7 % (ref 37.7–47.9)
Hemoglobin: 15.5 g/dL (ref 12.2–16.2)
Lymph, poc: 2.9 (ref 0.6–3.4)
MCH, POC: 34 pg — AB (ref 27–31.2)
MCHC: 33.3 g/dL (ref 31.8–35.4)
MCV: 102.3 fL — AB (ref 80–97)
MID (cbc): 0.5 (ref 0–0.9)
MPV: 8.7 fL (ref 0–99.8)
POC Granulocyte: 4.8 (ref 2–6.9)
POC LYMPH PERCENT: 35.7 %L (ref 10–50)
POC MID %: 5.6 %M (ref 0–12)
Platelet Count, POC: 240 10*3/uL (ref 142–424)
RBC: 4.56 M/uL (ref 4.04–5.48)
RDW, POC: 13.2 %
WBC: 8.2 10*3/uL (ref 4.6–10.2)

## 2014-09-11 LAB — COMPREHENSIVE METABOLIC PANEL
ALT: 134 U/L — ABNORMAL HIGH (ref 0–35)
AST: 121 U/L — ABNORMAL HIGH (ref 0–37)
Albumin: 3.9 g/dL (ref 3.5–5.2)
Alkaline Phosphatase: 84 U/L (ref 39–117)
BUN: 12 mg/dL (ref 6–23)
CO2: 28 mEq/L (ref 19–32)
Calcium: 9.2 mg/dL (ref 8.4–10.5)
Chloride: 94 mEq/L — ABNORMAL LOW (ref 96–112)
Creat: 0.64 mg/dL (ref 0.50–1.10)
Glucose, Bld: 163 mg/dL — ABNORMAL HIGH (ref 70–99)
Potassium: 3.6 mEq/L (ref 3.5–5.3)
Sodium: 133 mEq/L — ABNORMAL LOW (ref 135–145)
Total Bilirubin: 1 mg/dL (ref 0.2–1.2)
Total Protein: 8.6 g/dL — ABNORMAL HIGH (ref 6.0–8.3)

## 2014-09-11 LAB — LIPID PANEL
Cholesterol: 171 mg/dL (ref 0–200)
HDL: 58 mg/dL (ref >=46)
LDL Cholesterol: 83 mg/dL (ref 0–99)
Total CHOL/HDL Ratio: 2.9 Ratio
Triglycerides: 148 mg/dL (ref <150)
VLDL: 30 mg/dL (ref 0–40)

## 2014-09-11 LAB — POCT GLYCOSYLATED HEMOGLOBIN (HGB A1C): Hemoglobin A1C: 7.3

## 2014-09-11 LAB — TSH: TSH: 1.685 u[IU]/mL (ref 0.350–4.500)

## 2014-09-11 MED ORDER — LORAZEPAM 0.5 MG PO TABS: 0.5000 mg | ORAL_TABLET | Freq: Three times a day (TID) | ORAL | Status: DC

## 2014-09-11 MED ORDER — AZITHROMYCIN 250 MG PO TABS: ORAL_TABLET | ORAL | Status: DC

## 2014-09-11 MED ORDER — METHYLPREDNISOLONE ACETATE 80 MG/ML IJ SUSP
80.0000 mg | Freq: Once | INTRAMUSCULAR | Status: AC
  Administered 2014-09-11: 80 mg via INTRAMUSCULAR

## 2014-09-11 MED ORDER — TRIAMTERENE-HCTZ 75-50 MG PO TABS: 1.0000 | ORAL_TABLET | Freq: Every day | ORAL | Status: DC

## 2014-09-11 MED ORDER — OXYCODONE-ACETAMINOPHEN 5-325 MG PO TABS: 1.0000 | ORAL_TABLET | Freq: Three times a day (TID) | ORAL | Status: DC | PRN

## 2014-09-11 NOTE — Progress Notes (Signed)
This is a 64 year old woman who works at [employer]. She does the billing for them. She's been under great stress because they have a new computer system. She is not sleeping well. She feels that she is becoming more depressed and that maybe she needs a change from her Effexor 75 mg twice a day.  Patient also is under stress because this is the anniversary of her mother's death and her ex-boyfriend's mother just died, restimulated in those memories. Juanda Crumble is now out in [place] taking care of his father.  Patient still has relationship with a new friend who she sees almost every weekend.  Patient's been having trouble allergies with right eye irritation and redness. She's been using prolens for this with some improvement, as prescribed by Dr. Rutherford Guys.  Patient's also been having nasal congestion.  Objective: Patient's alert and recently cheerful There is mild injection of the right eye Nose shows mucopurulent discharge with some narrowing Oropharynx: Clear Respirations normal Extremities no edema Skin: Warm and dry Neck: Supple no adenopathy  Assessment: Patient has recent stressors and is not sleeping well. Moving to work on getting her better sleep and I refilled some of her medications. This chart was scribed in my presence and reviewed by me personally.    ICD-9-CM ICD-10-CM   1. Allergic rhinitis due to pollen 477.0 J30.1 methylPREDNISolone acetate (DEPO-MEDROL) injection 80 mg  2. Acute recurrent maxillary sinusitis 461.0 J01.01 azithromycin (ZITHROMAX Z-PAK) 250 MG tablet  3. Insomnia 780.52 G47.00 LORazepam (ATIVAN) 0.5 MG tablet     TSH     Comprehensive metabolic panel     Lipid panel     POCT CBC  4. Recurrent maxillary sinusitis, unspecified chronicity 461.0 J01.01 oxyCODONE-acetaminophen (PERCOCET/ROXICET) 5-325 MG per tablet  5. Body aches 780.96 R52 oxyCODONE-acetaminophen (PERCOCET/ROXICET) 5-325 MG per tablet     TSH     Comprehensive  metabolic panel     Lipid panel     POCT CBC     POCT glycosylated hemoglobin (Hb A1C)  6. Annual physical exam V70.0 Z00.00 oxyCODONE-acetaminophen (PERCOCET/ROXICET) 5-325 MG per tablet     POCT glycosylated hemoglobin (Hb A1C)  7. Essential hypertension 401.9 I10 triamterene-hydrochlorothiazide (MAXZIDE) 75-50 MG per tablet     Signed, Robyn Haber, MD

## 2014-09-12 ENCOUNTER — Encounter: Payer: Self-pay | Admitting: Radiology

## 2014-10-22 ENCOUNTER — Other Ambulatory Visit: Payer: Self-pay | Admitting: Physician Assistant

## 2014-12-31 ENCOUNTER — Other Ambulatory Visit: Payer: Self-pay | Admitting: Family Medicine

## 2015-01-14 ENCOUNTER — Other Ambulatory Visit: Payer: Self-pay

## 2015-01-14 ENCOUNTER — Other Ambulatory Visit: Payer: Self-pay | Admitting: Family Medicine

## 2015-01-14 DIAGNOSIS — Z1231 Encounter for screening mammogram for malignant neoplasm of breast: Secondary | ICD-10-CM

## 2015-01-18 ENCOUNTER — Other Ambulatory Visit: Payer: Self-pay | Admitting: Family Medicine

## 2015-01-20 ENCOUNTER — Ambulatory Visit
Admission: RE | Admit: 2015-01-20 | Discharge: 2015-01-20 | Disposition: A | Discharge status: Home / Self Care | Payer: 59 | Source: Ambulatory Visit

## 2015-01-20 DIAGNOSIS — Z1231 Encounter for screening mammogram for malignant neoplasm of breast: Secondary | ICD-10-CM

## 2015-01-21 ENCOUNTER — Other Ambulatory Visit: Payer: Self-pay | Admitting: Family Medicine

## 2015-01-22 NOTE — Telephone Encounter (Signed)
Called in.

## 2015-02-15 ENCOUNTER — Other Ambulatory Visit: Payer: Self-pay | Admitting: Family Medicine

## 2015-04-13 ENCOUNTER — Other Ambulatory Visit: Payer: Self-pay | Admitting: Family Medicine

## 2015-04-13 ENCOUNTER — Other Ambulatory Visit: Payer: Self-pay | Admitting: Physician Assistant

## 2015-04-15 NOTE — Telephone Encounter (Signed)
Dr. Carlean Jews  Patient was seen in 3/16 do not see any mention on Effexor,flexiril,prevacid can we refill.

## 2015-04-18 ENCOUNTER — Telehealth: Payer: Self-pay

## 2015-04-18 NOTE — Telephone Encounter (Signed)
Completed PA for brand name Effexor XR 75 mg BID which she has been on since 2014. Pt previously tried the generic which was not as effective for her. PA was approved through 04/17/16, case # [id number]. Notified pharmacy.

## 2015-04-25 ENCOUNTER — Encounter: Payer: Self-pay | Admitting: Internal Medicine

## 2015-04-30 ENCOUNTER — Ambulatory Visit (INDEPENDENT_AMBULATORY_CARE_PROVIDER_SITE_OTHER): Payer: 59 | Admitting: Family Medicine

## 2015-04-30 ENCOUNTER — Other Ambulatory Visit: Payer: Self-pay | Admitting: Family Medicine

## 2015-04-30 VITALS — BP 136/80 | HR 87 | Temp 98.3°F | Resp 16 | Ht 65.0 in | Wt 143.0 lb

## 2015-04-30 DIAGNOSIS — E785 Hyperlipidemia, unspecified: Secondary | ICD-10-CM

## 2015-04-30 DIAGNOSIS — R52 Pain, unspecified: Secondary | ICD-10-CM

## 2015-04-30 DIAGNOSIS — J0101 Acute recurrent maxillary sinusitis: Secondary | ICD-10-CM | POA: Diagnosis not present

## 2015-04-30 DIAGNOSIS — J40 Bronchitis, not specified as acute or chronic: Secondary | ICD-10-CM

## 2015-04-30 DIAGNOSIS — I10 Essential (primary) hypertension: Secondary | ICD-10-CM | POA: Diagnosis not present

## 2015-04-30 DIAGNOSIS — K219 Gastro-esophageal reflux disease without esophagitis: Secondary | ICD-10-CM

## 2015-04-30 DIAGNOSIS — Z0000 Encounter for general adult medical examination without abnormal findings: Secondary | ICD-10-CM

## 2015-04-30 DIAGNOSIS — F411 Generalized anxiety disorder: Secondary | ICD-10-CM | POA: Diagnosis not present

## 2015-04-30 DIAGNOSIS — G4700 Insomnia, unspecified: Secondary | ICD-10-CM

## 2015-04-30 MED ORDER — TRIAZOLAM 0.25 MG PO TABS: 0.2500 mg | ORAL_TABLET | Freq: Every evening | ORAL | Status: DC | PRN

## 2015-04-30 MED ORDER — ROSUVASTATIN CALCIUM 20 MG PO TABS: 20.0000 mg | ORAL_TABLET | Freq: Every day | ORAL | Status: DC

## 2015-04-30 MED ORDER — TRIAMTERENE-HCTZ 75-50 MG PO TABS: 1.0000 | ORAL_TABLET | Freq: Every day | ORAL | Status: DC

## 2015-04-30 MED ORDER — CYCLOBENZAPRINE HCL 10 MG PO TABS: ORAL_TABLET | ORAL | Status: DC

## 2015-04-30 MED ORDER — ALPRAZOLAM 0.5 MG PO TABS: ORAL_TABLET | ORAL | Status: DC

## 2015-04-30 MED ORDER — METHYLPREDNISOLONE ACETATE 80 MG/ML IJ SUSP
40.0000 mg | Freq: Once | INTRAMUSCULAR | Status: AC
  Administered 2015-04-30: 40 mg via INTRAMUSCULAR

## 2015-04-30 MED ORDER — FLUTICASONE-SALMETEROL 100-50 MCG/DOSE IN AEPB
1.0000 | INHALATION_SPRAY | Freq: Two times a day (BID) | RESPIRATORY_TRACT | Status: DC
Start: 2015-04-30 — End: 2015-05-26

## 2015-04-30 MED ORDER — OXYCODONE-ACETAMINOPHEN 5-325 MG PO TABS: 1.0000 | ORAL_TABLET | Freq: Three times a day (TID) | ORAL | Status: DC | PRN

## 2015-04-30 MED ORDER — AZELASTINE HCL 0.05 % OP SOLN: OPHTHALMIC | Status: DC

## 2015-04-30 MED ORDER — AZITHROMYCIN 250 MG PO TABS: ORAL_TABLET | ORAL | Status: DC

## 2015-04-30 MED ORDER — LANSOPRAZOLE 30 MG PO CPDR: 30.0000 mg | DELAYED_RELEASE_CAPSULE | Freq: Every day | ORAL | Status: DC

## 2015-04-30 MED ORDER — CETIRIZINE HCL 10 MG PO TABS: 10.0000 mg | ORAL_TABLET | Freq: Every day | ORAL | Status: DC

## 2015-04-30 MED ORDER — EFFEXOR XR 75 MG PO CP24: ORAL_CAPSULE | ORAL | Status: DC

## 2015-04-30 MED ORDER — MONTELUKAST SODIUM 10 MG PO TABS: ORAL_TABLET | ORAL | Status: DC

## 2015-04-30 NOTE — Progress Notes (Signed)
@[facility] @  This chart was scribed for Miranda Haber, MD by Miranda Cochran, ED Scribe. This patient was seen in room 5 and the patient's care was started at 8:04 PM.  [name] MRN: [id number], DOB: [birth year], 64 y.o. Date of Encounter: 04/30/2015, 8:04 PM  Primary Physician: Miranda Haber, MD  Chief Complaint:  Chief Complaint  Patient presents with  . Medication Refill    Xanax .05mg ,Optivar .5%, Flexeril 10mg , Advair, Prevacid 30mg ,Oxycodone, Crestor 20mg , Maxzide, Effexor XR 75 mg   . labwork    pt wants to check for liver functions, thyroid and potassium     HPI: 64 y.o. year old female with history below presents for a medication refill. Xanax .05mg ,Optivar .5%, Flexeril 10mg , Advair, Prevacid 30mg ,Oxycodone, Crestor 20mg , Maxzide, Effexor XR 3 m  She was released from oncology last year. Last mammogram was normal. She had colonoscopy which found 2 polyps. She is to repeat in 5 years.     She has also been sneezing. She has left sinus tenderness and has been blowing out clear mucous from her nose.  Pt has lost about 10-12 lbs. She has changed her diet and has been eating healthier.  Past Medical History  Diagnosis Date  . Sinusitis, chronic   . Hypertension   . Hyperlipidemia   . Vitamin D deficiency   . Hiatal hernia   . Allergy   . Anxiety   . Asthma   . Depression   . GERD (gastroesophageal reflux disease)   . DM (diabetes mellitus) ([place])   . Thyroid nodule   . IBS (irritable bowel syndrome)   . Hx of adenomatous polyp of colon 07/07/2009     Home Meds: Prior to Admission medications   Medication Sig Start Date End Date Taking? Authorizing Provider  ALPRAZolam Duanne Moron) 0.5 MG tablet take 1 tablet by mouth three times a day if needed for anxiety 01/20/15  Yes Miranda Haber, MD  aspirin 325 MG tablet Take 325 mg by mouth daily as needed.    Yes Historical Provider, MD  azelastine (OPTIVAR) 0.05 % ophthalmic solution instill 1 drop into  affected eye DAILY 08/12/14  Yes Miranda Haber, MD  azithromycin (ZITHROMAX Z-PAK) 250 MG tablet Take as directed on pack 09/11/14  Yes Miranda Haber, MD  cetirizine (ZYRTEC) 10 MG tablet Take 10 mg by mouth daily.   Yes Historical Provider, MD  cyclobenzaprine (FLEXERIL) 10 MG tablet take 1 tablet by mouth at bedtime if needed 04/16/15  Yes Miranda Haber, MD  EFFEXOR XR 75 MG 24 hr capsule take 1 capsule by mouth twice a day WITH A MEAL 04/16/15  Yes Miranda Haber, MD  Fluticasone-Salmeterol (ADVAIR) 100-50 MCG/DOSE AEPB Inhale 1 puff into the lungs 2 (two) times daily. 12/28/13  Yes Miranda Haber, MD  lansoprazole (PREVACID) 30 MG capsule Take 1 capsule (30 mg total) by mouth daily. PATIENT NEEDS OFFICE VISIT FOR ADDITIONAL REFILLS 04/16/15  Yes Miranda Haber, MD  LORazepam (ATIVAN) 0.5 MG tablet Take 1 tablet (0.5 mg total) by mouth every 8 (eight) hours. 09/11/14  Yes Miranda Haber, MD  montelukast (SINGULAIR) 10 MG tablet TAKE 1 TABLET BY MOUTH AT BEDTIME.  "OV NEEDED" 01/22/15  Yes Mancel Bale, PA-C  oxyCODONE-acetaminophen (PERCOCET/ROXICET) 5-325 MG per tablet Take 1 tablet by mouth every 8 (eight) hours as needed. 09/11/14  Yes Miranda Haber, MD  rosuvastatin (CRESTOR) 20 MG tablet Take 1 tablet (20 mg total) by mouth daily. 12/28/13  Yes Miranda Haber, MD  triamterene-hydrochlorothiazide [facility]) 75-50  MG per tablet Take 1 tablet by mouth daily. 09/11/14  Yes Miranda Haber, MD    Allergies:  Allergies  Allergen Reactions  . Levofloxacin     hallucinations  . Prednisone     Anxiety and paranoia  . Amoxicillin Other (See Comments)    diarrhea  . Augmentin [Amoxicillin-Pot Clavulanate] Other (See Comments)    diarrhea  . Simvastatin     myalgia    Social History   Social History  . Marital Status: Divorced    Spouse Name: N/A  . Number of Children: 0  . Years of Education: N/A   Occupational History  . Office management     [employer]   Social  History Main Topics  . Smoking status: Passive Smoke Exposure - Never Smoker    Types: Cigarettes  . Smokeless tobacco: Never Used  . Alcohol Use: 1.2 oz/week    2 Standard drinks or equivalent per week     Comment: 1-3/week  . Drug Use: No  . Sexual Activity: Yes   Other Topics Concern  . 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[place]: Constitutional: negative for chills, fever, night sweats, weight changes, or fatigue  HEENT: negative for vision changes, hearing loss, congestion, rhinorrhea, ST, epistaxis, or sinus pressure Cardiovascular: negative for chest pain or palpitations Respiratory: negative for hemoptysis, wheezing, shortness of breath, or cough Abdominal: negative for abdominal pain, nausea, vomiting, diarrhea, or constipation Dermatological: negative for rash Neurologic: negative for headache, dizziness, or syncope All other systems reviewed and are otherwise negative with the exception to those above and in the HPI.   Physical Exam: Blood pressure 136/80, pulse 87, temperature 98.3 F (36.8 C), temperature source Oral, resp. rate 16, height 5\' 5"  (1.651 m), weight 143 lb (64.864 kg), SpO2 98 %., Body mass index is 23.8 kg/(m^2). General: Well developed, well nourished, in no acute distress. Head: Normocephalic, atraumatic, eyes without discharge, sclera non-icteric, nares are without discharge.   Nasal passages have mucopurulent discharges and moderate erythema Bilateral auditory canals clear, TM's are without perforation, pearly grey and translucent with reflective cone of light bilaterally. Oral cavity moist, posterior pharynx without exudate, erythema, peritonsillar abscess, or post nasal drip.  Neck: Supple. No thyromegaly. Full ROM. No lymphadenopathy. Lungs: Clear bilaterally to auscultation without wheezes, rales, or rhonchi. Breathing is unlabored. Heart:  RRR with S1 S2. No murmurs, rubs, or gallops appreciated. Abdomen: Soft, non-tender, non-distended with normoactive bowel sounds. No hepatomegaly. No rebound/guarding. No obvious abdominal masses. Msk:  Strength and tone normal for age. Extremities/Skin: Warm and dry. No clubbing or cyanosis. No edema. No rashes or suspicious lesions. Neuro: Alert and oriented X 3. Moves all extremities spontaneously. Gait is normal. CNII-XII grossly in tact. Psych:  Responds to questions appropriately with a normal affect.     ASSESSMENT AND PLAN:  64 y.o. year old female with  This chart was scribed in my presence and reviewed by me personally.    ICD-9-CM ICD-10-CM   1. Essential hypertension 401.9 I10 triamterene-hydrochlorothiazide (MAXZIDE) 75-50 MG tablet  2. Hyperlipidemia 272.4 E78.5 rosuvastatin (CRESTOR) 20 MG tablet     Lipid panel  3. Recurrent maxillary sinusitis, unspecified chronicity 461.0 J01.01 oxyCODONE-acetaminophen (PERCOCET/ROXICET) 5-325 MG tablet     cetirizine (ZYRTEC) 10 MG tablet  4. Body aches 780.96 R52 oxyCODONE-acetaminophen (PERCOCET/ROXICET) 5-325 MG tablet     cyclobenzaprine (FLEXERIL) 10  MG tablet     EFFEXOR XR 75 MG 24 hr capsule  5. Annual physical exam V70.0 Z00.00 oxyCODONE-acetaminophen (PERCOCET/ROXICET) 5-325 MG tablet  6. Bronchitis 490 J40 montelukast (SINGULAIR) 10 MG tablet     Fluticasone-Salmeterol (ADVAIR) 100-50 MCG/DOSE AEPB     azelastine (OPTIVAR) 0.05 % ophthalmic solution  7. Acute recurrent maxillary sinusitis 461.0 J01.01 azithromycin (ZITHROMAX Z-PAK) 250 MG tablet  8. Gastroesophageal reflux disease, esophagitis presence not specified 530.81 K21.9 lansoprazole (PREVACID) 30 MG capsule  9. Anxiety state 300.00 F41.1 ALPRAZolam (XANAX) 0.5 MG tablet  10. Insomnia 780.52 G47.00      Signed, Miranda Haber, MD   By signing my name below, I, Raven Small, attest that this documentation has been prepared under the direction and in the  presence of Miranda Haber, MD.  Electronically Signed: Thea Cochran, ED Scribe. 04/30/2015. 8:11 PM.   Signed, Miranda Haber, MD 04/30/2015 8:04 PM

## 2015-04-30 NOTE — Addendum Note (Signed)
Addended by: Robyn Haber on: 04/30/2015 08:27 PM   Modules accepted: Orders

## 2015-05-01 LAB — LIPID PANEL
Cholesterol: 210 mg/dL — ABNORMAL HIGH (ref 125–200)
HDL: 43 mg/dL — ABNORMAL LOW (ref >=46)
LDL Cholesterol: 119 mg/dL (ref <130)
Total CHOL/HDL Ratio: 4.9 Ratio (ref <=5.0)
Triglycerides: 242 mg/dL — ABNORMAL HIGH (ref <150)
VLDL: 48 mg/dL — ABNORMAL HIGH (ref <30)

## 2015-05-05 ENCOUNTER — Telehealth: Payer: Self-pay

## 2015-05-05 DIAGNOSIS — E039 Hypothyroidism, unspecified: Secondary | ICD-10-CM

## 2015-05-05 NOTE — Telephone Encounter (Signed)
Spoke with [employer]. Tests added

## 2015-05-05 NOTE — Telephone Encounter (Signed)
Pt wants to know if we can add on a CMP and TSH

## 2015-05-06 LAB — COMPREHENSIVE METABOLIC PANEL
ALT: 146 U/L — ABNORMAL HIGH (ref 6–29)
AST: 112 U/L — ABNORMAL HIGH (ref 10–35)
Albumin: 3.3 g/dL — ABNORMAL LOW (ref 3.6–5.1)
Alkaline Phosphatase: 106 U/L (ref 33–130)
BUN: 12 mg/dL (ref 7–25)
CO2: 25 mmol/L (ref 20–31)
Calcium: 9.1 mg/dL (ref 8.6–10.4)
Chloride: 90 mmol/L — ABNORMAL LOW (ref 98–110)
Creat: 0.75 mg/dL (ref 0.50–0.99)
Glucose, Bld: 478 mg/dL — ABNORMAL HIGH (ref 65–99)
Potassium: 3.8 mmol/L (ref 3.5–5.3)
Sodium: 128 mmol/L — ABNORMAL LOW (ref 135–146)
Total Bilirubin: 0.7 mg/dL (ref 0.2–1.2)
Total Protein: 8.4 g/dL — ABNORMAL HIGH (ref 6.1–8.1)

## 2015-05-06 LAB — TSH: TSH: 1.46 u[IU]/mL (ref 0.350–4.500)

## 2015-05-07 ENCOUNTER — Telehealth: Payer: Self-pay

## 2015-05-07 ENCOUNTER — Ambulatory Visit (INDEPENDENT_AMBULATORY_CARE_PROVIDER_SITE_OTHER): Payer: 59 | Admitting: Family Medicine

## 2015-05-07 ENCOUNTER — Encounter: Payer: Self-pay | Admitting: Family Medicine

## 2015-05-07 VITALS — BP 130/78 | HR 93 | Temp 98.0°F | Resp 18 | Ht 66.0 in | Wt 141.8 lb

## 2015-05-07 DIAGNOSIS — E1165 Type 2 diabetes mellitus with hyperglycemia: Secondary | ICD-10-CM | POA: Diagnosis not present

## 2015-05-07 DIAGNOSIS — R739 Hyperglycemia, unspecified: Secondary | ICD-10-CM

## 2015-05-07 DIAGNOSIS — IMO0001 Reserved for inherently not codable concepts without codable children: Secondary | ICD-10-CM

## 2015-05-07 LAB — POCT GLYCOSYLATED HEMOGLOBIN (HGB A1C): Hemoglobin A1C: 9.6

## 2015-05-07 LAB — HEMOGLOBIN A1C: Hgb A1c MFr Bld: 9.6 % — AB (ref 4.0–6.0)

## 2015-05-07 LAB — GLUCOSE, POCT (MANUAL RESULT ENTRY): POC Glucose: 414 mg/dl — AB (ref 70–99)

## 2015-05-07 MED ORDER — GLIPIZIDE-METFORMIN HCL 2.5-500 MG PO TABS: 1.0000 | ORAL_TABLET | Freq: Two times a day (BID) | ORAL | Status: DC

## 2015-05-07 NOTE — Progress Notes (Signed)
This chart was scribed for Robyn Haber, MD by Moises Blood, medical scribe at [facility].The patient was seen in exam room 5 and the patient's care was started at 4:38 PM.  [name] MRN: [id number], DOB: [birth year], 64 y.o. Date of Encounter: 05/07/2015  Primary Physician: Robyn Haber, MD  Chief Complaint:  Chief Complaint  Patient presents with  . Follow-up    lab work    HPI:  Miranda Cochran is a 64 y.o. female who presents to [facility] for follow up on lab work. Her sugar was really high last time. Her mouth is dry from medication, so she drinks a lot of green tea and water. She denies drinking a lot of soft drinks. She's been losing weight because she's been eating healthier and only [place] food maybe once a week. She denies family history of DM.   She also requested to renew her handicapped parking for her back pain problem   Past Medical History  Diagnosis Date  . Sinusitis, chronic   . Hypertension   . Hyperlipidemia   . Vitamin D deficiency   . Hiatal hernia   . Allergy   . Anxiety   . Asthma   . Depression   . GERD (gastroesophageal reflux disease)   . DM (diabetes mellitus) ([place])   . Thyroid nodule   . IBS (irritable bowel syndrome)   . Hx of adenomatous polyp of colon 07/07/2009     Home Meds: Prior to Admission medications   Medication Sig Start Date End Date Taking? Authorizing Provider  ALPRAZolam Duanne Moron) 0.5 MG tablet once a day if needed for anxiety 04/30/15   Robyn Haber, MD  aspirin 325 MG tablet Take 325 mg by mouth daily as needed.     Historical Provider, MD  azelastine (OPTIVAR) 0.05 % ophthalmic solution instill 1 drop into affected eye DAILY 04/30/15   Robyn Haber, MD  azithromycin (ZITHROMAX Z-PAK) 250 MG tablet Take as directed on pack 04/30/15   Robyn Haber, MD  cetirizine (ZYRTEC) 10 MG tablet Take 1 tablet (10 mg total) by mouth daily. 04/30/15   Robyn Haber,  MD  cyclobenzaprine (FLEXERIL) 10 MG tablet take 1 tablet by mouth at bedtime if needed 04/30/15   Robyn Haber, MD  [place] Day XR 75 MG 24 hr capsule take 1 capsule by mouth twice a day WITH A MEAL 04/30/15   Robyn Haber, MD  Fluticasone-Salmeterol (ADVAIR) 100-50 MCG/DOSE AEPB Inhale 1 puff into the lungs 2 (two) times daily. 04/30/15   Robyn Haber, MD  lansoprazole (PREVACID) 30 MG capsule Take 1 capsule (30 mg total) by mouth daily. 04/30/15   Robyn Haber, MD  LORazepam (ATIVAN) 0.5 MG tablet Take 1 tablet (0.5 mg total) by mouth every 8 (eight) hours. 09/11/14   Robyn Haber, MD  montelukast (SINGULAIR) 10 MG tablet TAKE 1 TABLET BY MOUTH AT BEDTIME. 04/30/15   Robyn Haber, MD  oxyCODONE-acetaminophen (PERCOCET/ROXICET) 5-325 MG tablet Take 1 tablet by mouth every 8 (eight) hours as needed. 04/30/15   Robyn Haber, MD  rosuvastatin (CRESTOR) 20 MG tablet Take 1 tablet (20 mg total) by mouth daily. 04/30/15   Robyn Haber, MD  triamterene-hydrochlorothiazide (MAXZIDE) 75-50 MG tablet Take 1 tablet by mouth daily. 04/30/15   Robyn Haber, MD  triazolam (HALCION) 0.25 MG tablet Take 1 tablet (0.25 mg total) by mouth at bedtime as needed for sleep. 04/30/15   Robyn Haber, MD    Allergies:  Allergies  Allergen Reactions  . Levofloxacin     hallucinations  . Prednisone     Anxiety and paranoia  . Amoxicillin Other (See Comments)    diarrhea  . Augmentin [Amoxicillin-Pot Clavulanate] Other (See Comments)    diarrhea  . Simvastatin     myalgia    Social History   Social History  . Marital Status: Divorced    Spouse Name: N/A  . Number of Children: 0  . Years of Education: N/A   Occupational History  . Office management     [employer]   Social History Main Topics  . Smoking status: Passive Smoke Exposure - Never Smoker    Types: Cigarettes  . Smokeless tobacco: Never Used  . Alcohol Use: 1.2 oz/week    2 Standard drinks or equivalent  per week     Comment: 1-3/week  . Drug Use: No  . Sexual Activity: Yes   Other Topics Concern  . 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[place]: Constitutional: negative for fever, chills, night sweats, weight changes, or fatigue  HEENT: negative for vision changes, hearing loss, congestion, rhinorrhea, ST, epistaxis, or sinus pressure Cardiovascular: negative for chest pain or palpitations Respiratory: negative for hemoptysis, wheezing, shortness of breath, or cough Abdominal: negative for abdominal pain, nausea, vomiting, diarrhea, or constipation Dermatological: negative for rash Neurologic: negative for headache, dizziness, or syncope All other systems reviewed and are otherwise negative with the exception to those above and in the HPI.  Physical Exam: Blood pressure 130/78, pulse 93, temperature 98 F (36.7 C), temperature source Oral, resp. rate 18, height 5\' 6"  (1.676 m), weight 141 lb 12.8 oz (64.32 kg), SpO2 98 %., Body mass index is 22.9 kg/(m^2). General: Well developed, well nourished, in no acute distress. Head: Normocephalic, atraumatic, eyes without discharge, sclera non-icteric, nares are without discharge. Bilateral auditory canals clear, TM's are without perforation, pearly grey and translucent with reflective cone of light bilaterally. Oral cavity moist, posterior pharynx without exudate, erythema, peritonsillar abscess, or post nasal drip. Neck: Supple. No thyromegaly. Full ROM. No lymphadenopathy. Lungs: Clear bilaterally to auscultation without wheezes, rales, or rhonchi. Breathing is unlabored. Heart: RRR with S1 S2. No murmurs, rubs, or gallops appreciated. Msk:  Strength and tone normal for age. Extremities/Skin: Warm and dry. No clubbing or cyanosis. No edema. No rashes or suspicious lesions. Neuro: Alert and oriented X 3. Moves all extremities  spontaneously. Gait is normal. CNII-XII grossly in tact. Psych:  Responds to questions appropriately with a normal affect.   BP recheck in room, sitting (left room): 100/70  Labs:  Results for orders placed or performed in visit on 05/07/15  POCT glycosylated hemoglobin (Hb A1C)  Result Value Ref Range   Hemoglobin A1C 9.6   POCT glucose (manual entry)  Result Value Ref Range   POC Glucose 414 (A) 70 - 99 mg/dl     ASSESSMENT AND PLAN:  64 y.o. year old female with  This chart was scribed in my presence and reviewed by me personally.    ICD-9-CM ICD-10-CM   1. Hyperglycemia 790.29 R73.9 POCT glycosylated hemoglobin (Hb A1C)     POCT glucose (manual entry)    By signing my name below, I, Moises Blood, attest that this documentation has been prepared under the direction and in the presence of Robyn Haber, MD. Electronically Signed: Moises Blood, Scribe. 05/07/2015 , 4:38 PM .  Signed, Robyn Haber, MD 05/07/2015 4:38 PM

## 2015-05-07 NOTE — Patient Instructions (Signed)
Your blood sugar is over 400 which means that you have developed diabetes. I'm starting on medicine L bring the blood sugar down. Usually after a month or so, the diabetes will appear to go away. Important that you stay on your good diet and after the sugar starts coming down, we can reduce your medicine.  I want to recheck your sugar in one week

## 2015-05-07 NOTE — Telephone Encounter (Signed)
Fax number is [phone]

## 2015-05-07 NOTE — Telephone Encounter (Signed)
Faxed

## 2015-05-07 NOTE — Telephone Encounter (Signed)
Pt called an LM saying she wanted her labs faxed but didn't leave fax number. LMOM to CB with fax number

## 2015-05-14 ENCOUNTER — Encounter: Payer: Self-pay | Admitting: Family Medicine

## 2015-05-20 ENCOUNTER — Ambulatory Visit (INDEPENDENT_AMBULATORY_CARE_PROVIDER_SITE_OTHER): Payer: 59 | Admitting: Family Medicine

## 2015-05-20 VITALS — BP 116/74 | HR 83 | Temp 98.0°F | Resp 18 | Ht 66.0 in | Wt 144.2 lb

## 2015-05-20 DIAGNOSIS — E1165 Type 2 diabetes mellitus with hyperglycemia: Secondary | ICD-10-CM

## 2015-05-20 DIAGNOSIS — IMO0001 Reserved for inherently not codable concepts without codable children: Secondary | ICD-10-CM

## 2015-05-20 LAB — GLUCOSE, POCT (MANUAL RESULT ENTRY): POC Glucose: 168 mg/dl — AB (ref 70–99)

## 2015-05-20 MED ORDER — BLOOD GLUCOSE MONITOR KIT: PACK | Status: DC

## 2015-05-20 NOTE — Progress Notes (Signed)
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  Patient is recently been diagnosed with type 2 diabetes and is taking her medicine as prescribed twice a day for the last 2 and half days. She's gained a little weight since then and she's not having the polyuria.  Objective: Alert and cooperative BP 116/74 mmHg  Pulse 83  Temp(Src) 98 F (36.7 C) (Oral)  Resp 18  Ht 5\' 6"  (1.676 m)  Wt 144 lb 3.2 oz (65.409 kg)  BMI 23.29 kg/m2  SpO2 98% I spent 20 minutes discussing importance of limiting carbohydrates. I also discussed checking blood sugar  Results for orders placed or performed in visit on 05/20/15  POCT glucose (manual entry)  Result Value Ref Range   POC Glucose 168 (A) 70 - 99 mg/dl   Assessment: Markedly improved blood sugar  Plan: Reduce metaglip to 1 tablet daily. Recheck December 27  Signed Robyn Haber M.D.

## 2015-05-20 NOTE — Patient Instructions (Signed)
Blood sugar is now 168 which is excellent. Because of this, you can drop the current medication down to 1 tablet a day, preferably at night

## 2015-05-26 ENCOUNTER — Other Ambulatory Visit: Payer: Self-pay | Admitting: Family Medicine

## 2015-05-27 NOTE — Telephone Encounter (Signed)
Does she need a refill on Advair?

## 2015-06-02 ENCOUNTER — Other Ambulatory Visit: Payer: Self-pay | Admitting: Family Medicine

## 2015-06-24 ENCOUNTER — Ambulatory Visit (INDEPENDENT_AMBULATORY_CARE_PROVIDER_SITE_OTHER): Payer: 59 | Admitting: Family Medicine

## 2015-06-24 VITALS — BP 110/74 | HR 96 | Temp 98.4°F | Resp 18 | Ht 66.0 in | Wt 145.0 lb

## 2015-06-24 DIAGNOSIS — J0101 Acute recurrent maxillary sinusitis: Secondary | ICD-10-CM

## 2015-06-24 DIAGNOSIS — IMO0001 Reserved for inherently not codable concepts without codable children: Secondary | ICD-10-CM

## 2015-06-24 DIAGNOSIS — E1165 Type 2 diabetes mellitus with hyperglycemia: Secondary | ICD-10-CM

## 2015-06-24 LAB — GLUCOSE, POCT (MANUAL RESULT ENTRY): POC Glucose: 206 mg/dl — AB (ref 70–99)

## 2015-06-24 LAB — POCT GLYCOSYLATED HEMOGLOBIN (HGB A1C): Hemoglobin A1C: 7.1

## 2015-06-24 MED ORDER — AZITHROMYCIN 250 MG PO TABS: ORAL_TABLET | ORAL | Status: DC

## 2015-06-24 NOTE — Progress Notes (Signed)
This chart was scribed in my presence and reviewed by me personally.    ICD-9-CM ICD-10-CM   1. Uncontrolled type 2 diabetes mellitus without complication, without long-term current use of insulin (HCC) 250.02 E11.65 POCT glycosylated hemoglobin (Hb A1C)     POCT glucose (manual entry)  2. Recurrent maxillary sinusitis, unspecified chronicity 461.0 J01.01 azithromycin (ZITHROMAX) 250 MG tablet     Signed, Robyn Haber, MD

## 2015-06-24 NOTE — Progress Notes (Signed)
   Subjective:    [name], female    DOB: 1950/10/25, 65 y.o.   MRN: [id number] By signing my name below, I, Zola Button, attest that this documentation has been prepared under the direction and in the presence of Robyn Haber, MD.  Electronically Signed: Zola Button, Medical Scribe. 06/24/2015. 1:30 PM.  HPI HPI Comments: Miranda Cochran is a 65 y.o. female with a history of DM who presents to the [facility] gradual onset sinus pressure that started 2 days ago. Patient reports having associated sneezing and rhinorrhea. She tried Mucinex and Benadryl last night with some relief. She has also been taking some leftover Z-pak. Patient has allergies to levofloxacin, amoxicillin, and Augmentin. She was previously taking allergy shots for mold and cat dander. She does have cats at home but notes she does not have any symptomatic reactions to them.  Patient notes that she has gained some weight.  Review of Systems  HENT: Positive for rhinorrhea, sinus pressure and sneezing.        Objective:   Physical Exam CONSTITUTIONAL: Well developed/well nourished HEAD: Normocephalic/atraumatic. Bilateral nasal passage mucopurulent discharge. EYES: EOM/PERRL ENMT: Mucous membranes moist NECK: supple no meningeal signs SPINE: entire spine nontender CV: S1/S2 noted, no murmurs/rubs/gallops noted LUNGS: Lungs are clear to auscultation bilaterally, no apparent distress ABDOMEN: soft, nontender, no rebound or guarding GU: no cva tenderness NEURO: Pt is awake/alert, moves all extremitiesx4 EXTREMITIES: pulses normal, full ROM SKIN: warm, color normal PSYCH: no abnormalities of mood noted Results for orders placed or performed in visit on 06/24/15  POCT glycosylated hemoglobin (Hb A1C)  Result Value Ref Range   Hemoglobin A1C 7.1   POCT glucose (manual entry)  Result Value Ref Range   POC Glucose 206 (A) 70 - 99 mg/dl          Assessment & Plan:    This  chart was scribed in my presence and reviewed by me personally.    ICD-9-CM ICD-10-CM   1. Uncontrolled type 2 diabetes mellitus without complication, without long-term current use of insulin (HCC) 250.02 E11.65 POCT glycosylated hemoglobin (Hb A1C)     POCT glucose (manual entry)  2. Recurrent maxillary sinusitis, unspecified chronicity 461.0 J01.01 azithromycin (ZITHROMAX) 250 MG tablet     Signed, Robyn Haber, MD

## 2015-08-20 ENCOUNTER — Ambulatory Visit (INDEPENDENT_AMBULATORY_CARE_PROVIDER_SITE_OTHER): Payer: 59 | Admitting: Family Medicine

## 2015-08-20 VITALS — BP 124/80 | HR 89 | Temp 98.0°F | Resp 16 | Ht 66.0 in | Wt 147.0 lb

## 2015-08-20 DIAGNOSIS — E1165 Type 2 diabetes mellitus with hyperglycemia: Secondary | ICD-10-CM | POA: Diagnosis not present

## 2015-08-20 DIAGNOSIS — IMO0001 Reserved for inherently not codable concepts without codable children: Secondary | ICD-10-CM

## 2015-08-20 MED ORDER — METFORMIN HCL 500 MG PO TABS: 500.0000 mg | ORAL_TABLET | Freq: Two times a day (BID) | ORAL | Status: DC

## 2015-08-20 MED ORDER — DAPAGLIFLOZIN PROPANEDIOL 10 MG PO TABS: 10.0000 mg | ORAL_TABLET | Freq: Every day | ORAL | Status: DC

## 2015-08-20 NOTE — Patient Instructions (Signed)
Keep checking sugar 3 to 4 times a week.  Come in again in a month.

## 2015-08-20 NOTE — Progress Notes (Signed)
By signing my name below, I, Moises Blood, attest that this documentation has been prepared under the direction and in the presence of Robyn Haber, MD. Electronically Signed: Moises Blood, [place]. 08/20/2015 , 4:13 PM .  Patient was seen in room 4 .   [name] MRN: [id number], DOB: 1950-10-15, 65 y.o. Date of Encounter: 08/20/2015  Primary Physician: Robyn Haber, MD  Chief Complaint:  Chief Complaint  Patient presents with  . Follow-up    Diabetes    HPI:  Miranda Cochran is a 65 y.o. female who presents to [facility] follow up on her diabetes.  She's been having a lot of gas, and gained 4 lbs since last visit. She feels like she can't breathe because her pants are too tight.    blood sugars have been fluctuating from the mid 100s to as high as 400. Patient is quite depressed about her weight gain. She states that her diet has not changed.  Wt Readings from Last 3 Encounters:  08/20/15 147 lb (66.679 kg)  06/24/15 145 lb (65.772 kg)  05/20/15 144 lb 3.2 oz (65.409 kg)   She bought a brand new mattress and sleep has been well.   She has history of cataracts surgery. But some days, her eyes can't focus well even with her reading glasses on.   Past Medical History  Diagnosis Date  . Sinusitis, chronic   . Hypertension   . Hyperlipidemia   . Vitamin D deficiency   . Hiatal hernia   . Allergy   . Anxiety   . Asthma   . Depression   . GERD (gastroesophageal reflux disease)   . DM (diabetes mellitus) ([place])   . Thyroid nodule   . IBS (irritable bowel syndrome)   . Hx of adenomatous polyp of colon 07/07/2009     Home Meds: Prior to Admission medications   Medication Sig Start Date End Date Taking? Authorizing Provider  ADVAIR DISKUS 100-50 MCG/DOSE AEPB inhale 1 puffs INTO THE LUNGS TWO TIMES A DAY 06/02/15   Robyn Haber, MD  ALPRAZolam Duanne Moron) 0.5 MG tablet once a day if needed for anxiety 04/30/15   Robyn Haber, MD   aspirin 325 MG tablet Take 325 mg by mouth daily as needed.     Historical Provider, MD  azelastine (OPTIVAR) 0.05 % ophthalmic solution instill 1 drop into affected eye DAILY 04/30/15   Robyn Haber, MD  azithromycin (ZITHROMAX) 250 MG tablet Take 2 tabs PO x 1 dose, then 1 tab PO QD x 4 days 06/24/15   Robyn Haber, MD  blood glucose meter kit and supplies KIT Dispense based on patient and insurance preference. Use up to four times daily as directed. (FOR ICD-9 250.00, 250.01). 05/20/15   Robyn Haber, MD  cetirizine (ZYRTEC) 10 MG tablet Take 1 tablet (10 mg total) by mouth daily. Patient not taking: Reported on 06/24/2015 04/30/15   Robyn Haber, MD  cyclobenzaprine (FLEXERIL) 10 MG tablet take 1 tablet by mouth at bedtime if needed 04/30/15   Robyn Haber, MD  [facility] XR 75 MG 24 hr capsule take 1 capsule by mouth twice a day WITH A MEAL 04/30/15   Robyn Haber, MD  glipiZIDE-metformin (METAGLIP) 2.5-500 MG tablet Take 1 tablet by mouth 2 (two) times daily before a meal. 05/07/15   Robyn Haber, MD  lansoprazole (PREVACID) 30 MG capsule Take 1 capsule (30 mg total) by mouth daily. 04/30/15   Robyn Haber, MD  LORazepam (ATIVAN) 0.5  MG tablet Take 1 tablet (0.5 mg total) by mouth every 8 (eight) hours. 09/11/14   Robyn Haber, MD  montelukast (SINGULAIR) 10 MG tablet TAKE 1 TABLET BY MOUTH AT BEDTIME. 04/30/15   Robyn Haber, MD  oxyCODONE-acetaminophen (PERCOCET/ROXICET) 5-325 MG tablet Take 1 tablet by mouth every 8 (eight) hours as needed. 04/30/15   Robyn Haber, MD  rosuvastatin (CRESTOR) 20 MG tablet Take 1 tablet (20 mg total) by mouth daily. Patient not taking: Reported on 06/24/2015 04/30/15   Robyn Haber, MD  triamterene-hydrochlorothiazide (MAXZIDE) 75-50 MG tablet Take 1 tablet by mouth daily. 04/30/15   Robyn Haber, MD  triazolam (HALCION) 0.25 MG tablet Take 1 tablet (0.25 mg total) by mouth at bedtime as needed for sleep. 04/30/15   Robyn Haber, MD    Allergies:  Allergies  Allergen Reactions  . Levofloxacin     hallucinations  . Prednisone     Anxiety and paranoia  . Amoxicillin Other (See Comments)    diarrhea  . Augmentin [Amoxicillin-Pot Clavulanate] Other (See Comments)    diarrhea  . Simvastatin     myalgia    Social History   Social History  . Marital Status: Divorced    Spouse Name: N/A  . Number of Children: 0  . Years of Education: N/A   Occupational History  . Office management     [employer]   Social History Main Topics  . Smoking status: Passive Smoke Exposure - Never Smoker    Types: Cigarettes  . Smokeless tobacco: Never Used  . Alcohol Use: 1.2 oz/week    2 Standard drinks or equivalent per week     Comment: 1-3/week  . Drug Use: No  . Sexual Activity: Yes   Other Topics Concern  . 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[place]: Constitutional: negative for fever, chills, night sweats, or fatigue; positive for unexpected weight change HEENT: negative for vision changes, hearing loss, congestion, rhinorrhea, ST, epistaxis, or sinus pressure Cardiovascular: negative for chest pain or palpitations Respiratory: negative for hemoptysis, wheezing, shortness of breath, or cough Abdominal: negative for abdominal pain, nausea, vomiting, diarrhea, or constipation; positive for abdominal bloating Dermatological: negative for rash Neurologic: negative for headache, dizziness, or syncope All other systems reviewed and are otherwise negative with the exception to those above and in the HPI.  Physical Exam: Blood pressure 124/80, pulse 89, temperature 98 F (36.7 C), temperature source Oral, resp. rate 16, height '5\' 6"'$  (1.676 m), weight 147 lb (66.679 kg), SpO2 98 %., Body mass index is 23.74 kg/(m^2). General: Well developed, well nourished, in no acute distress. Head:  Normocephalic, atraumatic, eyes without discharge, sclera non-icteric, nares are without discharge. Bilateral auditory canals clear, TM's are without perforation, pearly grey and translucent with reflective cone of light bilaterally. Oral cavity moist, posterior pharynx without exudate, erythema, peritonsillar abscess, or post nasal drip.  Neck: Supple. No thyromegaly. Full ROM. No lymphadenopathy. Lungs: Clear bilaterally to auscultation without wheezes, rales, or rhonchi. Breathing is unlabored. Heart: RRR with S1 S2. No murmurs, rubs, or gallops appreciated. Abdomen: Soft, non-tender, non-distended with normoactive bowel sounds. No hepatomegaly. No rebound/guarding. No obvious abdominal masses. Msk:  Strength and tone normal for age. Extremities/Skin: Warm and dry. No clubbing or cyanosis. No edema. No rashes or suspicious lesions. Neuro: Alert and oriented X 3. Moves all extremities spontaneously. Gait is normal. CNII-XII grossly  in tact. Psych:  Responds to questions appropriately with a normal affect.     ASSESSMENT AND PLAN:  65 y.o. year old female with  This chart was scribed in my presence and reviewed by me personally.    ICD-9-CM ICD-10-CM   1. Uncontrolled type 2 diabetes mellitus without complication, without long-term current use of insulin (HCC) 250.02 E11.65 dapagliflozin propanediol (FARXIGA) 10 MG TABS tablet     metFORMIN (GLUCOPHAGE) 500 MG tablet   Recheck one month  Signed, Robyn Haber, MD     Signed, Robyn Haber, MD 08/20/2015 4:13 PM

## 2015-09-24 ENCOUNTER — Ambulatory Visit (INDEPENDENT_AMBULATORY_CARE_PROVIDER_SITE_OTHER): Payer: 59 | Admitting: Family Medicine

## 2015-09-24 VITALS — BP 122/80 | HR 80 | Temp 98.3°F | Resp 16 | Wt 148.0 lb

## 2015-09-24 DIAGNOSIS — E1165 Type 2 diabetes mellitus with hyperglycemia: Secondary | ICD-10-CM

## 2015-09-24 DIAGNOSIS — R740 Nonspecific elevation of levels of transaminase and lactic acid dehydrogenase [LDH]: Secondary | ICD-10-CM | POA: Diagnosis not present

## 2015-09-24 DIAGNOSIS — R7401 Elevation of levels of liver transaminase levels: Secondary | ICD-10-CM

## 2015-09-24 DIAGNOSIS — IMO0001 Reserved for inherently not codable concepts without codable children: Secondary | ICD-10-CM

## 2015-09-24 DIAGNOSIS — J322 Chronic ethmoidal sinusitis: Secondary | ICD-10-CM | POA: Diagnosis not present

## 2015-09-24 DIAGNOSIS — R7402 Elevation of levels of lactic acid dehydrogenase (LDH): Secondary | ICD-10-CM

## 2015-09-24 LAB — POCT GLYCOSYLATED HEMOGLOBIN (HGB A1C): Hemoglobin A1C: 7.8

## 2015-09-24 MED ORDER — PROMETHAZINE HCL 12.5 MG PO TABS: 12.5000 mg | ORAL_TABLET | Freq: Three times a day (TID) | ORAL | Status: DC | PRN

## 2015-09-24 MED ORDER — FUROSEMIDE 20 MG PO TABS: 20.0000 mg | ORAL_TABLET | Freq: Every day | ORAL | Status: DC | PRN

## 2015-09-24 MED ORDER — EXENATIDE ER 2 MG ~~LOC~~ PEN: 2.0000 mg | PEN_INJECTOR | SUBCUTANEOUS | Status: DC

## 2015-09-24 MED ORDER — AZITHROMYCIN 250 MG PO TABS: ORAL_TABLET | ORAL | Status: DC

## 2015-09-24 MED ORDER — METHYLPREDNISOLONE ACETATE 80 MG/ML IJ SUSP
80.0000 mg | Freq: Once | INTRAMUSCULAR | Status: AC
  Administered 2015-09-24: 80 mg via INTRAMUSCULAR

## 2015-09-24 NOTE — Progress Notes (Signed)
This 66 year old woman who works at [employer]. She has recurrent sinus infections and she's here for diabetes check.  Several days of sinus congestion, dental pain.  Could not tolerate the [place].  Just taking the Metformin.  Objective:BP 122/80 mmHg  Pulse 80  Temp(Src) 98.3 F (36.8 C) (Oral)  Resp 16  Wt 148 lb (67.132 kg)  SpO2 98% HEENT: Marked nasal passage swelling, otherwise negative Chest: Clear Heart: Regular no murmur Skin: No obvious rashes   Wt Readings from Last 3 Encounters:  09/24/15 148 lb (67.132 kg)  08/20/15 147 lb (66.679 kg)  06/24/15 145 lb (65.772 kg)   Lab Results  Component Value Date   HGBA1C 7.8 09/24/2015   Chronic ethmoidal sinusitis - Plan: azithromycin (ZITHROMAX) 250 MG tablet, methylPREDNISolone acetate (DEPO-MEDROL) injection 80 mg  Uncontrolled type 2 diabetes mellitus without complication, without long-term current use of insulin ([place]) - Plan: POCT glycosylated hemoglobin (Hb A1C), Exenatide ER (BYDUREON) 2 MG PEN  TRANSAMINASES, SERUM, ELEVATED - Plan: COMPLETE METABOLIC PANEL WITH GFR  Robyn Haber M.D.

## 2015-09-24 NOTE — Patient Instructions (Addendum)
We are starting new diabetes medicine called Bydureon which is a shot that you need to take once a week. I would like to see you back in a month to recheck how your sugars are going. Please continue to monitor them.  I've called in a prescription for your sinuses.    IF you received an x-ray today, you will receive an invoice from [facility]. Please contact [facility] at [phone] with questions or concerns regarding your invoice.   IF you received labwork today, you will receive an invoice from [facility]. Please contact [facility] at [phone] with questions or concerns regarding your invoice.   Our billing staff will not be able to assist you with questions regarding bills from these companies.  You will be contacted with the lab results as soon as they are available. The fastest way to get your results is to activate your My Chart account. Instructions are located on the last page of this paperwork. If you have not heard from [place] regarding the results in 2 weeks, please contact this office.

## 2015-09-24 NOTE — Addendum Note (Signed)
Addended by: Robyn Haber on: 09/24/2015 06:27 PM   Modules accepted: Orders

## 2015-09-25 LAB — COMPLETE METABOLIC PANEL WITH GFR
ALT: 123 U/L — ABNORMAL HIGH (ref 6–29)
AST: 110 U/L — ABNORMAL HIGH (ref 10–35)
Albumin: 3.5 g/dL — ABNORMAL LOW (ref 3.6–5.1)
Alkaline Phosphatase: 112 U/L (ref 33–130)
BUN: 11 mg/dL (ref 7–25)
CO2: 24 mmol/L (ref 20–31)
Calcium: 9 mg/dL (ref 8.6–10.4)
Chloride: 91 mmol/L — ABNORMAL LOW (ref 98–110)
Creat: 0.57 mg/dL (ref 0.50–0.99)
GFR, Est African American: >89 mL/min (ref >=60)
GFR, Est Non African American: >89 mL/min (ref >=60)
Glucose, Bld: 130 mg/dL — ABNORMAL HIGH (ref 65–99)
Potassium: 3.4 mmol/L — ABNORMAL LOW (ref 3.5–5.3)
Sodium: 127 mmol/L — ABNORMAL LOW (ref 135–146)
Total Bilirubin: 0.9 mg/dL (ref 0.2–1.2)
Total Protein: 9.3 g/dL — ABNORMAL HIGH (ref 6.1–8.1)

## 2015-11-01 ENCOUNTER — Ambulatory Visit (INDEPENDENT_AMBULATORY_CARE_PROVIDER_SITE_OTHER): Payer: 59 | Admitting: Family Medicine

## 2015-11-01 VITALS — BP 138/88 | HR 84 | Temp 98.8°F | Resp 18 | Ht 66.0 in | Wt 146.8 lb

## 2015-11-01 DIAGNOSIS — R7989 Other specified abnormal findings of blood chemistry: Secondary | ICD-10-CM

## 2015-11-01 DIAGNOSIS — R52 Pain, unspecified: Secondary | ICD-10-CM

## 2015-11-01 DIAGNOSIS — E1165 Type 2 diabetes mellitus with hyperglycemia: Secondary | ICD-10-CM | POA: Diagnosis not present

## 2015-11-01 DIAGNOSIS — J0101 Acute recurrent maxillary sinusitis: Secondary | ICD-10-CM

## 2015-11-01 DIAGNOSIS — R945 Abnormal results of liver function studies: Secondary | ICD-10-CM

## 2015-11-01 DIAGNOSIS — Z0000 Encounter for general adult medical examination without abnormal findings: Secondary | ICD-10-CM

## 2015-11-01 DIAGNOSIS — IMO0001 Reserved for inherently not codable concepts without codable children: Secondary | ICD-10-CM

## 2015-11-01 LAB — LIPID PANEL
Cholesterol: 223 mg/dL — ABNORMAL HIGH (ref 125–200)
HDL: 54 mg/dL
LDL Cholesterol: 148 mg/dL — ABNORMAL HIGH
Total CHOL/HDL Ratio: 4.1 ratio
Triglycerides: 104 mg/dL
VLDL: 21 mg/dL

## 2015-11-01 LAB — COMPLETE METABOLIC PANEL WITHOUT GFR
ALT: 104 U/L — ABNORMAL HIGH (ref 6–29)
AST: 96 U/L — ABNORMAL HIGH (ref 10–35)
Albumin: 3.5 g/dL — ABNORMAL LOW (ref 3.6–5.1)
Alkaline Phosphatase: 92 U/L (ref 33–130)
BUN: 11 mg/dL (ref 7–25)
CO2: 30 mmol/L (ref 20–31)
Calcium: 8.5 mg/dL — ABNORMAL LOW (ref 8.6–10.4)
Chloride: 93 mmol/L — ABNORMAL LOW (ref 98–110)
Creat: 0.65 mg/dL (ref 0.50–0.99)
GFR, Est African American: >89 mL/min
GFR, Est Non African American: >89 mL/min
Glucose, Bld: 152 mg/dL — ABNORMAL HIGH (ref 65–99)
Potassium: 3.6 mmol/L (ref 3.5–5.3)
Sodium: 131 mmol/L — ABNORMAL LOW (ref 135–146)
Total Bilirubin: 0.7 mg/dL (ref 0.2–1.2)
Total Protein: 8.5 g/dL — ABNORMAL HIGH (ref 6.1–8.1)

## 2015-11-01 LAB — POCT GLYCOSYLATED HEMOGLOBIN (HGB A1C): Hemoglobin A1C: 6.8

## 2015-11-01 LAB — GLUCOSE, POCT (MANUAL RESULT ENTRY): POC Glucose: 152 mg/dl — AB (ref 70–99)

## 2015-11-01 MED ORDER — GLUCOSE BLOOD VI STRP: ORAL_STRIP | Status: DC

## 2015-11-01 MED ORDER — ONETOUCH ULTRASOFT LANCETS MISC: Status: DC

## 2015-11-01 MED ORDER — EXENATIDE ER 2 MG ~~LOC~~ PEN: 2.0000 mg | PEN_INJECTOR | SUBCUTANEOUS | Status: DC

## 2015-11-01 MED ORDER — OXYCODONE-ACETAMINOPHEN 5-325 MG PO TABS: 1.0000 | ORAL_TABLET | Freq: Three times a day (TID) | ORAL | Status: DC | PRN

## 2015-11-01 NOTE — Progress Notes (Signed)
65 yo diabetic woman with h/o thyroid nodule who has recently started on Bydureon.  Sugars are better but still over 200 some of the time.  She attributes her diet to some of the elevation (eg pineapple shake at [employer] yesterday).  Not exercising regularly.  Vision varies since her cataract surgery a year ago.  Objective:  BP 138/88 mmHg  Pulse 84  Temp(Src) 98.8 F (37.1 C) (Oral)  Resp 18  Ht 5\' 6"  (1.676 m)  Wt 146 lb 12.8 oz (66.588 kg)  BMI 23.71 kg/m2  SpO2 95% Wt Readings from Last 3 Encounters:  11/01/15 146 lb 12.8 oz (66.588 kg)  09/24/15 148 lb (67.132 kg)  08/20/15 147 lb (66.679 kg)  Thyroid:  No change in mild nodularity Lab Results  Component Value Date   HGBA1C 7.8 09/24/2015   Results for orders placed or performed in visit on 11/01/15  POCT glucose (manual entry)  Result Value Ref Range   POC Glucose 152 (A) 70 - 99 mg/dl  POCT glycosylated hemoglobin (Hb A1C)  Result Value Ref Range   Hemoglobin A1C 6.8     Results for orders placed or performed in visit on 09/24/15  COMPLETE METABOLIC PANEL WITH GFR  Result Value Ref Range   Sodium 127 (L) 135 - 146 mmol/L   Potassium 3.4 (L) 3.5 - 5.3 mmol/L   Chloride 91 (L) 98 - 110 mmol/L   CO2 24 20 - 31 mmol/L   Glucose, Bld 130 (H) 65 - 99 mg/dL   BUN 11 7 - 25 mg/dL   Creat 0.57 0.50 - 0.99 mg/dL   Total Bilirubin 0.9 0.2 - 1.2 mg/dL   Alkaline Phosphatase 112 33 - 130 U/L   AST 110 (H) 10 - 35 U/L   ALT 123 (H) 6 - 29 U/L   Total Protein 9.3 (H) 6.1 - 8.1 g/dL   Albumin 3.5 (L) 3.6 - 5.1 g/dL   Calcium 9.0 8.6 - 10.4 mg/dL   GFR, Est African American >89 >=60 mL/min   GFR, Est Non African American >89 >=60 mL/min  POCT glycosylated hemoglobin (Hb A1C)  Result Value Ref Range   Hemoglobin A1C 7.8    Lab Results  Component Value Date   CHOL 210* 04/30/2015   HDL 43* 04/30/2015   LDLCALC 119 04/30/2015   TRIG 242* 04/30/2015   CHOLHDL 4.9 04/30/2015    Uncontrolled type 2 diabetes mellitus  without complication, without long-term current use of insulin (HCC) - Plan: glucose blood test strip, Lancets (ONETOUCH ULTRASOFT) lancets, POCT glucose (manual entry), POCT glycosylated hemoglobin (Hb A1C), Lipid panel  Abnormal LFTs - Plan: COMPLETE METABOLIC PANEL WITH GFR  Recurrent maxillary sinusitis, unspecified chronicity - Plan: oxyCODONE-acetaminophen (PERCOCET/ROXICET) 5-325 MG tablet  Body aches - Plan: oxyCODONE-acetaminophen (PERCOCET/ROXICET) 5-325 MG tablet  Annual physical exam - Plan: oxyCODONE-acetaminophen (PERCOCET/ROXICET) 5-325 MG tablet  Robyn Haber, MD

## 2015-11-01 NOTE — Patient Instructions (Addendum)
Results for orders placed or performed in visit on 09/24/15  COMPLETE METABOLIC PANEL WITH GFR  Result Value Ref Range   Sodium 127 (L) 135 - 146 mmol/L   Potassium 3.4 (L) 3.5 - 5.3 mmol/L   Chloride 91 (L) 98 - 110 mmol/L   CO2 24 20 - 31 mmol/L   Glucose, Bld 130 (H) 65 - 99 mg/dL   BUN 11 7 - 25 mg/dL   Creat 0.57 0.50 - 0.99 mg/dL   Total Bilirubin 0.9 0.2 - 1.2 mg/dL   Alkaline Phosphatase 112 33 - 130 U/L   AST 110 (H) 10 - 35 U/L   ALT 123 (H) 6 - 29 U/L   Total Protein 9.3 (H) 6.1 - 8.1 g/dL   Albumin 3.5 (L) 3.6 - 5.1 g/dL   Calcium 9.0 8.6 - 10.4 mg/dL   GFR, Est African American >89 >=60 mL/min   GFR, Est Non African American >89 >=60 mL/min  POCT glycosylated hemoglobin (Hb A1C)  Result Value Ref Range   Hemoglobin A1C 7.8

## 2015-11-09 ENCOUNTER — Other Ambulatory Visit: Payer: Self-pay | Admitting: Family Medicine

## 2015-11-11 NOTE — Telephone Encounter (Signed)
Rx printed at 104. Will bring to 102 after clinic.  Meds ordered this encounter  Medications  . ALPRAZolam (XANAX) 0.5 MG tablet    Sig: take 1 tablet by mouth once daily if needed for anxiety    Dispense:  30 tablet    Refill:  0

## 2015-11-11 NOTE — Telephone Encounter (Signed)
Faxed

## 2015-11-12 ENCOUNTER — Telehealth: Payer: Self-pay

## 2015-11-12 NOTE — Telephone Encounter (Signed)
Patient has been doing the bydureon shot and now has swelling at the injection shot for the first time. Patient wants to know if this is normal. Please advise! [phone]

## 2015-11-12 NOTE — Telephone Encounter (Signed)
She can get a knot at the site but there should not be warmth and it should not be larger than a cm

## 2015-11-12 NOTE — Telephone Encounter (Signed)
Pt advised.

## 2015-12-12 ENCOUNTER — Other Ambulatory Visit: Payer: Self-pay | Admitting: Internal Medicine

## 2015-12-25 LAB — HM DIABETES EYE EXAM

## 2016-01-20 ENCOUNTER — Encounter: Payer: Self-pay | Admitting: Family Medicine

## 2016-02-04 ENCOUNTER — Telehealth: Payer: Self-pay | Admitting: Internal Medicine

## 2016-02-04 ENCOUNTER — Other Ambulatory Visit: Payer: Self-pay | Admitting: Family Medicine

## 2016-02-04 DIAGNOSIS — Z1231 Encounter for screening mammogram for malignant neoplasm of breast: Secondary | ICD-10-CM

## 2016-02-04 NOTE — Telephone Encounter (Signed)
Patient advised and verbalized understanding and will contact their office.

## 2016-02-04 NOTE — Telephone Encounter (Signed)
Ok to refill Sir? 

## 2016-02-04 NOTE — Telephone Encounter (Signed)
Med list shows she was rxed that last at [facility] with 5 refills so ask her to f/u with them on that

## 2016-02-13 ENCOUNTER — Ambulatory Visit: Payer: 59

## 2016-02-19 ENCOUNTER — Telehealth: Payer: Self-pay | Admitting: Physician Assistant

## 2016-02-19 DIAGNOSIS — F411 Generalized anxiety disorder: Secondary | ICD-10-CM

## 2016-02-19 NOTE — Telephone Encounter (Signed)
Please contact the patient - we have received something from her insurance company and they want to know if she wants to switch from brand only Effexor to generic - it will save her $540 a year - I have put the form in the box up front.

## 2016-02-20 ENCOUNTER — Ambulatory Visit: Payer: 59

## 2016-02-23 ENCOUNTER — Ambulatory Visit: Payer: 59

## 2016-02-23 ENCOUNTER — Ambulatory Visit
Admission: RE | Admit: 2016-02-23 | Discharge: 2016-02-23 | Disposition: A | Discharge status: Home / Self Care | Payer: 59 | Source: Ambulatory Visit | Attending: Family Medicine | Admitting: Family Medicine

## 2016-02-23 DIAGNOSIS — Z1231 Encounter for screening mammogram for malignant neoplasm of breast: Secondary | ICD-10-CM

## 2016-02-24 NOTE — Telephone Encounter (Signed)
I have made a not in her problem list.

## 2016-02-24 NOTE — Telephone Encounter (Signed)
Spoke with pt, advised pt and she has tried to take the generic but it does not work very well for her and she has explained this to them repeatedly. We can disregard.

## 2016-02-25 NOTE — Telephone Encounter (Signed)
Faxed back denial to [facility].

## 2016-03-05 ENCOUNTER — Other Ambulatory Visit: Payer: Self-pay | Admitting: Family Medicine

## 2016-03-05 DIAGNOSIS — J40 Bronchitis, not specified as acute or chronic: Secondary | ICD-10-CM

## 2016-03-06 ENCOUNTER — Ambulatory Visit: Payer: 59

## 2016-04-01 ENCOUNTER — Encounter (HOSPITAL_COMMUNITY): Payer: Self-pay | Admitting: Emergency Medicine

## 2016-04-01 ENCOUNTER — Ambulatory Visit (HOSPITAL_COMMUNITY)
Admission: EM | Admit: 2016-04-01 | Discharge: 2016-04-01 | Disposition: A | Discharge status: Home / Self Care | Payer: 59 | Attending: Family Medicine | Admitting: Family Medicine

## 2016-04-01 DIAGNOSIS — J0101 Acute recurrent maxillary sinusitis: Secondary | ICD-10-CM | POA: Diagnosis not present

## 2016-04-01 DIAGNOSIS — I10 Essential (primary) hypertension: Secondary | ICD-10-CM

## 2016-04-01 DIAGNOSIS — R52 Pain, unspecified: Secondary | ICD-10-CM

## 2016-04-01 DIAGNOSIS — M545 Low back pain, unspecified: Secondary | ICD-10-CM

## 2016-04-01 DIAGNOSIS — Z0000 Encounter for general adult medical examination without abnormal findings: Secondary | ICD-10-CM

## 2016-04-01 DIAGNOSIS — IMO0001 Reserved for inherently not codable concepts without codable children: Secondary | ICD-10-CM

## 2016-04-01 DIAGNOSIS — E1165 Type 2 diabetes mellitus with hyperglycemia: Secondary | ICD-10-CM | POA: Diagnosis not present

## 2016-04-01 DIAGNOSIS — K219 Gastro-esophageal reflux disease without esophagitis: Secondary | ICD-10-CM

## 2016-04-01 DIAGNOSIS — E1101 Type 2 diabetes mellitus with hyperosmolarity with coma: Secondary | ICD-10-CM

## 2016-04-01 DIAGNOSIS — J40 Bronchitis, not specified as acute or chronic: Secondary | ICD-10-CM | POA: Diagnosis not present

## 2016-04-01 MED ORDER — PROMETHAZINE HCL 12.5 MG PO TABS: 12.5000 mg | ORAL_TABLET | Freq: Three times a day (TID) | ORAL | 11 refills | Status: DC | PRN

## 2016-04-01 MED ORDER — ONETOUCH ULTRASOFT LANCETS MISC: 12 refills | Status: DC

## 2016-04-01 MED ORDER — EFFEXOR XR 75 MG PO CP24: ORAL_CAPSULE | ORAL | 3 refills | Status: DC

## 2016-04-01 MED ORDER — TRIAMTERENE-HCTZ 75-50 MG PO TABS: 1.0000 | ORAL_TABLET | Freq: Every day | ORAL | 3 refills | Status: DC

## 2016-04-01 MED ORDER — GLIPIZIDE 5 MG PO TABS: 5.0000 mg | ORAL_TABLET | Freq: Every day | ORAL | 5 refills | Status: DC | PRN

## 2016-04-01 MED ORDER — FLUTICASONE-SALMETEROL 100-50 MCG/DOSE IN AEPB: 1.0000 | INHALATION_SPRAY | Freq: Two times a day (BID) | RESPIRATORY_TRACT | 11 refills | Status: DC

## 2016-04-01 MED ORDER — ALPRAZOLAM 0.5 MG PO TABS: 0.5000 mg | ORAL_TABLET | Freq: Every evening | ORAL | 1 refills | Status: DC | PRN

## 2016-04-01 MED ORDER — CYCLOBENZAPRINE HCL 10 MG PO TABS: ORAL_TABLET | ORAL | 5 refills | Status: DC

## 2016-04-01 MED ORDER — AZITHROMYCIN 250 MG PO TABS: 250.0000 mg | ORAL_TABLET | Freq: Every day | ORAL | 0 refills | Status: DC

## 2016-04-01 MED ORDER — AZELASTINE HCL 0.05 % OP SOLN: OPHTHALMIC | 11 refills | Status: DC

## 2016-04-01 MED ORDER — OXYCODONE-ACETAMINOPHEN 5-325 MG PO TABS: 1.0000 | ORAL_TABLET | Freq: Three times a day (TID) | ORAL | 0 refills | Status: DC | PRN

## 2016-04-01 MED ORDER — GLUCOSE BLOOD VI STRP: ORAL_STRIP | 12 refills | Status: DC

## 2016-04-01 MED ORDER — LANSOPRAZOLE 30 MG PO CPDR: 30.0000 mg | DELAYED_RELEASE_CAPSULE | Freq: Every day | ORAL | 3 refills | Status: DC

## 2016-04-01 MED ORDER — MONTELUKAST SODIUM 10 MG PO TABS
ORAL_TABLET | ORAL | 3 refills | Status: DC
Start: 2016-04-01 — End: 2017-09-19

## 2016-04-01 MED ORDER — METFORMIN HCL 500 MG PO TABS: 500.0000 mg | ORAL_TABLET | Freq: Two times a day (BID) | ORAL | 3 refills | Status: DC

## 2016-04-01 MED ORDER — FUROSEMIDE 20 MG PO TABS: 20.0000 mg | ORAL_TABLET | Freq: Every day | ORAL | 11 refills | Status: DC | PRN

## 2016-04-01 NOTE — ED Triage Notes (Signed)
Here for medication refill and also c/o cold sx onset 1.5 weeks  Sx today include; nasal congestion, HA, dizziness, and feeling lethargic  A&O x4... NAD

## 2016-04-01 NOTE — ED Provider Notes (Signed)
[facility]    CSN: [id number] Arrival date & time: 04/01/16  [place]     History   Chief Complaint Chief Complaint  Patient presents with  . Medication Refill  . URI    HPI Miranda Cochran is a 65 y.o. female.   This is 65 year old woman with uncontrolled diabetes for the last several years. She needs a refill on her medication and a referral to internal medicine.  In addition, patient is had sinus infection symptoms for the last couple weeks. She's having nasal congestion, epistaxis, and facial pressure.  Patient has some numbness in her feet but this is been unchanged since she had fractures in her feet. She had a recent ophthalmologic exam which was normal. She's been unable to take the by durian because of local reactions when she gives herself the shots. She hasn't taken a shot in several months. We will reviewed her blood sugars and they're over 250 regularly.  Patient has chronic low back pain and intermittent headaches. 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 A recent relationship was terminated and this is been stressful for her as well.  She visits chiropractor intermittently for her back.      Past Medical History:  Diagnosis Date  . Allergy   . Anxiety   . Asthma   . Depression   . DM (diabetes mellitus) ([place])   . GERD (gastroesophageal reflux disease)   . Hiatal hernia   . Hx of adenomatous polyp of colon 07/07/2009  . Hyperlipidemia   . Hypertension   . IBS (irritable bowel syndrome)   . Sinusitis, chronic   . Thyroid nodule   . Vitamin D deficiency     Patient Active Problem List   Diagnosis Date Noted  . Sinusitis, chronic   . Hypertension   . Hyperlipidemia   . Vitamin D deficiency   . Thyroid nodule 04/29/2011  . Hx of adenomatous polyp of colon 07/07/2009  . IRRITABLE BOWEL SYNDROME 10/26/2007  . TRANSAMINASES, SERUM, ELEVATED 10/26/2007  . DIABETES  MELLITUS 10/24/2007  . Anxiety state 10/24/2007  . GERD 10/24/2007    Past Surgical History:  Procedure Laterality Date  . CATARACT EXTRACTION Bilateral   . COLONOSCOPY  11/1991, 06/2009  . ESOPHAGOGASTRODUODENOSCOPY  02/2002  . EYE SURGERY    . NASAL SEPTOPLASTY W/ TURBINOPLASTY    . TONSILLECTOMY      OB History    No data available       Home Medications    Prior to Admission medications   Medication Sig Start Date End Date Taking? Authorizing Provider  aspirin 325 MG tablet Take 325 mg by mouth daily as needed.    Yes Historical Provider, MD  ALPRAZolam Duanne Moron) 0.5 MG tablet Take 1 tablet (0.5 mg total) by mouth at bedtime as needed for anxiety. 04/01/16   Robyn Haber, MD  azelastine (OPTIVAR) 0.05 % ophthalmic solution instill 1 drop into affected eye DAILY 04/01/16   Robyn Haber, MD  azithromycin (ZITHROMAX) 250 MG tablet Take 1 tablet (250 mg total) by mouth daily. Take first 2 tablets together, then 1 every day until finished. 04/01/16   Robyn Haber, MD  blood glucose meter kit and supplies KIT Dispense based on patient and insurance preference. Use up to four times daily as directed. (FOR ICD-9 250.00, 250.01). 05/20/15   Robyn Haber, MD  cyclobenzaprine (FLEXERIL) 10 MG tablet take 1 tablet by mouth at bedtime if needed  04/01/16   Robyn Haber, MD  EFFEXOR XR 75 MG 24 hr capsule take 1 capsule by mouth twice a day WITH A MEAL 04/01/16   Robyn Haber, MD  Fluticasone-Salmeterol (ADVAIR DISKUS) 100-50 MCG/DOSE AEPB Inhale 1 puff into the lungs 2 (two) times daily. 04/01/16   Robyn Haber, MD  furosemide (LASIX) 20 MG tablet Take 1 tablet (20 mg total) by mouth daily as needed. 04/01/16   Robyn Haber, MD  glipiZIDE (GLUCOTROL) 5 MG tablet Take 1 tablet (5 mg total) by mouth daily as needed. 04/01/16   Robyn Haber, MD  glucose blood test strip Use as instructed 04/01/16   Robyn Haber, MD  Lancets [facility] ULTRASOFT) lancets Use as instructed  04/01/16   Robyn Haber, MD  lansoprazole (PREVACID) 30 MG capsule Take 1 capsule (30 mg total) by mouth daily. 04/01/16   Robyn Haber, MD  metFORMIN (GLUCOPHAGE) 500 MG tablet Take 1 tablet (500 mg total) by mouth 2 (two) times daily with a meal. 04/01/16   Robyn Haber, MD  montelukast (SINGULAIR) 10 MG tablet TAKE 1 TABLET BY MOUTH AT BEDTIME. 04/01/16   Robyn Haber, MD  oxyCODONE-acetaminophen (PERCOCET/ROXICET) 5-325 MG tablet Take 1 tablet by mouth every 8 (eight) hours as needed. 04/01/16   Robyn Haber, MD  promethazine (PHENERGAN) 12.5 MG tablet Take 1 tablet (12.5 mg total) by mouth every 8 (eight) hours as needed for nausea or vomiting. 04/01/16   Robyn Haber, MD  triamterene-hydrochlorothiazide (MAXZIDE) 75-50 MG tablet Take 1 tablet by mouth daily. 04/01/16   Robyn Haber, MD    Family History Family History  Problem Relation Age of Onset  . Stroke Mother   . Hypertension Mother   . Colon cancer Father   . Liver cancer Father   . Heart disease Sister   . Stroke Maternal Grandmother   . Hypertension Maternal Grandmother   . Esophageal cancer Maternal Grandfather   . Breast cancer Paternal Grandmother   . Brain cancer Paternal Grandmother   . Lung cancer Paternal Grandfather     Social History Social History  Substance Use Topics  . Smoking status: Passive Smoke Exposure - Never Smoker    Types: Cigarettes  . Smokeless tobacco: Never Used  . Alcohol use 1.2 oz/week    2 Standard drinks or equivalent per week     Comment: 1-3/week     Allergies   Levofloxacin; Prednisone; Amoxicillin; Augmentin [amoxicillin-pot clavulanate]; Farxiga [dapagliflozin]; and Simvastatin   Review of Systems Review of Systems  Constitutional: Positive for fatigue.  HENT: Positive for congestion, nosebleeds and sinus pressure. Negative for sore throat.   Eyes: Negative.   Respiratory: Positive for cough.   Cardiovascular: Negative.   Gastrointestinal: Positive  for abdominal distention.  Endocrine: Positive for polydipsia.  Genitourinary: Negative.   Musculoskeletal: Positive for arthralgias, back pain, myalgias and neck pain.  Skin: Negative.   Neurological: Positive for numbness and headaches.  Psychiatric/Behavioral: The patient is nervous/anxious.      Physical Exam Triage Vital Signs ED Triage Vitals  Enc Vitals Group     BP 04/01/16 1859 144/81     Pulse Rate 04/01/16 1859 83     Resp 04/01/16 1859 16     Temp 04/01/16 1859 98.3 F (36.8 C)     Temp Source 04/01/16 1859 Oral     SpO2 04/01/16 1859 96 %     Weight --      Height --      Head Circumference --  Peak Flow --      Pain Score 04/01/16 1900 5     Pain Loc --      Pain Edu? --      Excl. in [place]? --    No data found.   Updated Vital Signs BP 144/81 (BP Location: Left Arm)   Pulse 83   Temp 98.3 F (36.8 C) (Oral)   Resp 16   SpO2 96%    Physical Exam  Constitutional: She appears well-developed and well-nourished.  HENT:  Head: Normocephalic.  Right Ear: External ear normal.  Left Ear: External ear normal.  Mouth/Throat: Oropharynx is clear and moist.  Eyes: Conjunctivae and EOM are normal. Pupils are equal, round, and reactive to light.  Neck: Normal range of motion. Neck supple.  Cardiovascular: Normal rate, regular rhythm and normal heart sounds.   Pulmonary/Chest: Effort normal and breath sounds normal.  Musculoskeletal: Normal range of motion.  Skin: Skin is warm and dry.  Nursing note and vitals reviewed.    UC Treatments / Results  Labs (all labs ordered are listed, but only abnormal results are displayed) Labs Reviewed - No data to display  EKG  EKG Interpretation None       Radiology No results found.  Procedures Procedures (including critical care time)  Medications Ordered in UC Medications - No data to display   Initial Impression / Assessment and Plan / UC Course  I have reviewed the triage vital signs and the  nursing notes.  Pertinent labs & imaging results that were available during my care of the patient were reviewed by me and considered in my medical decision making (see chart for details).  Clinical Course    Final Clinical Impressions(s) / UC Diagnoses   Final diagnoses:  Acute recurrent maxillary sinusitis  Diabetes mellitus type 2 with hyperosmolar coma, uncontrolled, unspecified long term insulin use status (HCC)    New Prescriptions New Prescriptions   AZITHROMYCIN (ZITHROMAX) 250 MG TABLET    Take 1 tablet (250 mg total) by mouth daily. Take first 2 tablets together, then 1 every day until finished.   GLIPIZIDE (GLUCOTROL) 5 MG TABLET    Take 1 tablet (5 mg total) by mouth daily as needed.  Other medicines were refilled until she get in to see Arnette Norris our internal medicine clinic.   Robyn Haber, MD 04/01/16 [phone]

## 2016-04-12 ENCOUNTER — Ambulatory Visit: Payer: 59 | Admitting: Internal Medicine

## 2016-05-24 ENCOUNTER — Encounter: Payer: Self-pay | Admitting: Family Medicine

## 2016-05-24 ENCOUNTER — Other Ambulatory Visit: Payer: Self-pay | Admitting: Family Medicine

## 2016-05-24 ENCOUNTER — Ambulatory Visit (INDEPENDENT_AMBULATORY_CARE_PROVIDER_SITE_OTHER): Payer: 59 | Admitting: Family Medicine

## 2016-05-24 VITALS — BP 110/72 | HR 90 | Temp 98.2°F | Ht 64.0 in | Wt 139.4 lb

## 2016-05-24 DIAGNOSIS — I10 Essential (primary) hypertension: Secondary | ICD-10-CM | POA: Diagnosis not present

## 2016-05-24 DIAGNOSIS — E871 Hypo-osmolality and hyponatremia: Secondary | ICD-10-CM

## 2016-05-24 DIAGNOSIS — Z7689 Persons encountering health services in other specified circumstances: Secondary | ICD-10-CM

## 2016-05-24 DIAGNOSIS — E1165 Type 2 diabetes mellitus with hyperglycemia: Secondary | ICD-10-CM | POA: Diagnosis not present

## 2016-05-24 DIAGNOSIS — R748 Abnormal levels of other serum enzymes: Secondary | ICD-10-CM

## 2016-05-24 DIAGNOSIS — IMO0001 Reserved for inherently not codable concepts without codable children: Secondary | ICD-10-CM

## 2016-05-24 LAB — LIPID PANEL
CHOL/HDL RATIO: 6
Cholesterol: 209 mg/dL — ABNORMAL HIGH (ref 0–200)
HDL: 37.6 mg/dL — AB (ref >39.00)
LDL Cholesterol: 145 mg/dL — ABNORMAL HIGH (ref 0–99)
NONHDL: 171.31
Triglycerides: 134 mg/dL (ref 0.0–149.0)
VLDL: 26.8 mg/dL (ref 0.0–40.0)

## 2016-05-24 LAB — CBC WITH DIFFERENTIAL/PLATELET
BASOS ABS: 0 10*3/uL (ref 0.0–0.1)
Basophils Relative: 0.3 % (ref 0.0–3.0)
EOS PCT: 1.9 % (ref 0.0–5.0)
Eosinophils Absolute: 0.2 10*3/uL (ref 0.0–0.7)
HEMATOCRIT: 37.1 % (ref 36.0–46.0)
Hemoglobin: 12.8 g/dL (ref 12.0–15.0)
LYMPHS ABS: 3.1 10*3/uL (ref 0.7–4.0)
LYMPHS PCT: 35.2 % (ref 12.0–46.0)
MCHC: 34.6 g/dL (ref 30.0–36.0)
MCV: 95.5 fl (ref 78.0–100.0)
MONOS PCT: 10.6 % (ref 3.0–12.0)
Monocytes Absolute: 0.9 10*3/uL (ref 0.1–1.0)
NEUTROS ABS: 4.5 10*3/uL (ref 1.4–7.7)
NEUTROS PCT: 52 % (ref 43.0–77.0)
PLATELETS: 246 10*3/uL (ref 150.0–400.0)
RBC: 3.89 Mil/uL (ref 3.87–5.11)
RDW: 14.1 % (ref 11.5–15.5)
WBC: 8.7 10*3/uL (ref 4.0–10.5)

## 2016-05-24 LAB — BASIC METABOLIC PANEL
BUN: 17 mg/dL (ref 6–23)
CALCIUM: 9.3 mg/dL (ref 8.4–10.5)
CHLORIDE: 83 meq/L — AB (ref 96–112)
CO2: 33 meq/L — AB (ref 19–32)
Creatinine, Ser: 0.77 mg/dL (ref 0.40–1.20)
GFR: 80.19 mL/min (ref >60.00)
GLUCOSE: 175 mg/dL — AB (ref 70–99)
POTASSIUM: 3.2 meq/L — AB (ref 3.5–5.1)
SODIUM: 126 meq/L — AB (ref 135–145)

## 2016-05-24 LAB — HEPATIC FUNCTION PANEL
ALT: 129 U/L — ABNORMAL HIGH (ref 0–35)
AST: 140 U/L — ABNORMAL HIGH (ref 0–37)
Albumin: 3.5 g/dL (ref 3.5–5.2)
Alkaline Phosphatase: 133 U/L — ABNORMAL HIGH (ref 39–117)
BILIRUBIN DIRECT: 0.5 mg/dL — AB (ref 0.0–0.3)
TOTAL PROTEIN: 9.6 g/dL — AB (ref 6.0–8.3)
Total Bilirubin: 1.3 mg/dL — ABNORMAL HIGH (ref 0.2–1.2)

## 2016-05-24 LAB — MICROALBUMIN / CREATININE URINE RATIO
CREATININE, U: 134.5 mg/dL
MICROALB/CREAT RATIO: 7.8 mg/g (ref 0.0–30.0)
Microalb, Ur: 10.5 mg/dL — ABNORMAL HIGH (ref 0.0–1.9)

## 2016-05-24 LAB — HEMOGLOBIN A1C: HEMOGLOBIN A1C: 8.3 % — AB (ref 4.6–6.5)

## 2016-05-24 MED ORDER — LINAGLIPTIN 5 MG PO TABS: 5.0000 mg | ORAL_TABLET | Freq: Every day | ORAL | 1 refills | Status: DC

## 2016-05-24 MED ORDER — LISINOPRIL 10 MG PO TABS: 10.0000 mg | ORAL_TABLET | Freq: Every day | ORAL | 1 refills | Status: DC

## 2016-05-24 NOTE — Progress Notes (Signed)
Pre visit review using our clinic review tool, if applicable. No additional management support is needed unless otherwise documented below in the visit note. 

## 2016-05-24 NOTE — Patient Instructions (Signed)
We have ordered labs or studies at this visit. It can take up to 1-2 weeks for results and processing. IF results require follow up or explanation, we will call you with instructions. Clinically stable results will be released to your [facility]. If you have not heard from [place] or cannot find your results in [facility] in 2 weeks please contact our office at [phone].  If you are not yet signed up for [facility], please consider signing up   Follow up will be determined based up lab results  Please considering monitoring your blood pressure as we discussed and blood sugar once daily for evaluation of control of diabetes.   Carbohydrate Counting for Diabetes Mellitus, Adult Carbohydrate counting is a method for keeping track of how many carbohydrates you eat. Eating carbohydrates naturally increases the amount of sugar (glucose) in the blood. Counting how many carbohydrates you eat helps keep your blood glucose within normal limits, which helps you manage your diabetes (diabetes mellitus). It is important to know how many carbohydrates you can safely have in each meal. This is different for every person. A diet and nutrition specialist (registered dietitian) can help you make a meal plan and calculate how many carbohydrates you should have at each meal and snack. Carbohydrates are found in the following foods:  Grains, such as breads and cereals.  Dried beans and soy products.  Starchy vegetables, such as potatoes, peas, and corn.  Fruit and fruit juices.  Milk and yogurt.  Sweets and snack foods, such as cake, cookies, candy, chips, and soft drinks. How do I count carbohydrates? There are two ways to count carbohydrates in food. You can use either of the methods or a combination of both. Reading "Nutrition Facts" on packaged food  The "Nutrition Facts" list is included on the labels of almost all packaged foods and beverages in the [place] It includes:  The serving size.  Information about  nutrients in each serving, including the grams (g) of carbohydrate per serving. To use the "Nutrition Facts":  Decide how many servings you will have.  Multiply the number of servings by the number of carbohydrates per serving.  The resulting number is the total amount of carbohydrates that you will be having. Learning standard serving sizes of other foods  When you eat foods containing carbohydrates that are not packaged or do not include "Nutrition Facts" on the label, you need to measure the servings in order to count the amount of carbohydrates:  Measure the foods that you will eat with a food scale or measuring cup, if needed.  Decide how many standard-size servings you will eat.  Multiply the number of servings by 15. Most carbohydrate-rich foods have about 15 g of carbohydrates per serving.  For example, if you eat 8 oz (170 g) of strawberries, you will have eaten 2 servings and 30 g of carbohydrates (2 servings x 15 g = 30 g).  For foods that have more than one food mixed, such as soups and casseroles, you must count the carbohydrates in each food that is included. The following list contains standard serving sizes of common carbohydrate-rich foods. Each of these servings has about 15 g of carbohydrates:   hamburger bun or  English muffin.   oz (15 mL) syrup.   oz (14 g) jelly.  1 slice of bread.  1 six-inch tortilla.  3 oz (85 g) cooked rice or pasta.  4 oz (113 g) cooked dried beans.  4 oz (113 g) starchy vegetable, such  as peas, corn, or potatoes.  4 oz (113 g) hot cereal.  4 oz (113 g) mashed potatoes or  of a large baked potato.  4 oz (113 g) canned or frozen fruit.  4 oz (120 mL) fruit juice.  4-6 crackers.  6 chicken nuggets.  6 oz (170 g) unsweetened dry cereal.  6 oz (170 g) plain fat-free yogurt or yogurt sweetened with artificial sweeteners.  8 oz (240 mL) milk.  8 oz (170 g) fresh fruit or one small piece of fruit.  24 oz (680 g)  popped popcorn. Example of carbohydrate counting Sample meal  3 oz (85 g) chicken breast.  6 oz (170 g) brown rice.  4 oz (113 g) corn.  8 oz (240 mL) milk.  8 oz (170 g) strawberries with sugar-free whipped topping. Carbohydrate calculation 1. Identify the foods that contain carbohydrates:  Rice.  Corn.  Milk.  Strawberries. 2. Calculate how many servings you have of each food:  2 servings rice.  1 serving corn.  1 serving milk.  1 serving strawberries. 3. Multiply each number of servings by 15 g:  2 servings rice x 15 g = 30 g.  1 serving corn x 15 g = 15 g.  1 serving milk x 15 g = 15 g.  1 serving strawberries x 15 g = 15 g. 4. Add together all of the amounts to find the total grams of carbohydrates eaten:  30 g + 15 g + 15 g + 15 g = 75 g of carbohydrates total. This information is not intended to replace advice given to you by your health care provider. Make sure you discuss any questions you have with your health care provider. Document Released: 05/31/2005 Document Revised: 12/19/2015 Document Reviewed: 11/12/2015 Elsevier Interactive Patient Education  2017 [employer].

## 2016-05-24 NOTE — Progress Notes (Signed)
[name], female   DOB: 1951-02-17, 65 y.o.   MRN: [id number]  Patient presents to clinic today to establish care.  Chronic Issues: Type 2 DM:  Monitors her blood sugar at home once or twice/week which she reports that are in the 200s. She reports that her blood sugar goes up due to "stress".   She is currently taking Metformin 500 mg BID. She reports taking glipizide 5 mg "as needed" and she does not use this on a daily basis. She denies episodes of hypoglycemia, polyuria, polyphagia, polydipsia. She denies monitoring her carbohydrate intake. She states that her sugar increase is due to her dietary choices.  She does not exercise at all.   BP: Maxizide daily; she does not monitor her blood pressure daily. She denies chest pain, palpitations, SOB, edema, headaches, nosebleeds, or intermittent claudication.   Health Maintenance: Dental -- Every 6 months Vision -- Vision checks yearly.  Last appointment in July; She reports that findings are normal Immunizations --Declines Flu vaccine; She denies shingles vaccine and pneumonia vaccines Colonoscopy -- Removed 2 polyps in 05/2014; She is due in 2020 Mammogram -- UTD; Due in June/July PAP -- Not needed Bone Density -- Needed; last one in 2008   Past Medical History:  Diagnosis Date  . Allergy   . Anxiety   . Asthma   . Depression   . DM (diabetes mellitus) ([place])   . GERD (gastroesophageal reflux disease)   . Hiatal hernia   . Hx of adenomatous polyp of colon 07/07/2009  . Hyperlipidemia   . Hypertension   . IBS (irritable bowel syndrome)   . Sinusitis, chronic   . Thyroid nodule   . Vitamin D deficiency     Past Surgical History:  Procedure Laterality Date  . CATARACT EXTRACTION Bilateral   . COLONOSCOPY  11/1991, 06/2009  . ESOPHAGOGASTRODUODENOSCOPY  02/2002  . EYE SURGERY    . NASAL SEPTOPLASTY W/ TURBINOPLASTY    . TONSILLECTOMY      Current Outpatient Prescriptions on File Prior to Visit  Medication Sig  Dispense Refill  . ALPRAZolam (XANAX) 0.5 MG tablet Take 1 tablet (0.5 mg total) by mouth at bedtime as needed for anxiety. 90 tablet 1  . aspirin 325 MG tablet Take 325 mg by mouth daily as needed.     Marland Kitchen azelastine (OPTIVAR) 0.05 % ophthalmic solution instill 1 drop into affected eye DAILY 6 mL 11  . azithromycin (ZITHROMAX) 250 MG tablet Take 1 tablet (250 mg total) by mouth daily. Take first 2 tablets together, then 1 every day until finished. 6 tablet 0  . blood glucose meter kit and supplies KIT Dispense based on patient and insurance preference. Use up to four times daily as directed. (FOR ICD-9 250.00, 250.01). 1 each 0  . cyclobenzaprine (FLEXERIL) 10 MG tablet take 1 tablet by mouth at bedtime if needed 30 tablet 5  . EFFEXOR XR 75 MG 24 hr capsule take 1 capsule by mouth twice a day WITH A MEAL 180 capsule 3  . Fluticasone-Salmeterol (ADVAIR DISKUS) 100-50 MCG/DOSE AEPB Inhale 1 puff into the lungs 2 (two) times daily. 60 each 11  . furosemide (LASIX) 20 MG tablet Take 1 tablet (20 mg total) by mouth daily as needed. 30 tablet 11  . glipiZIDE (GLUCOTROL) 5 MG tablet Take 1 tablet (5 mg total) by mouth daily as needed. 30 tablet 5  . glucose blood test strip Use as instructed 100 each 12  . Lancets (ONETOUCH ULTRASOFT)  lancets Use as instructed 100 each 12  . lansoprazole (PREVACID) 30 MG capsule Take 1 capsule (30 mg total) by mouth daily. 90 capsule 3  . metFORMIN (GLUCOPHAGE) 500 MG tablet Take 1 tablet (500 mg total) by mouth 2 (two) times daily with a meal. 180 tablet 3  . montelukast (SINGULAIR) 10 MG tablet TAKE 1 TABLET BY MOUTH AT BEDTIME. 90 tablet 3  . oxyCODONE-acetaminophen (PERCOCET/ROXICET) 5-325 MG tablet Take 1 tablet by mouth every 8 (eight) hours as needed. 30 tablet 0  . promethazine (PHENERGAN) 12.5 MG tablet Take 1 tablet (12.5 mg total) by mouth every 8 (eight) hours as needed for nausea or vomiting. 20 tablet 11  . triamterene-hydrochlorothiazide (MAXZIDE) 75-50 MG  tablet Take 1 tablet by mouth daily. 90 tablet 3   No current facility-administered medications on file prior to visit.     Allergies  Allergen Reactions  . Levofloxacin     hallucinations  . Prednisone     Anxiety and paranoia  . Amoxicillin Other (See Comments)    diarrhea  . Augmentin [Amoxicillin-Pot Clavulanate] Other (See Comments)    diarrhea  . Farxiga [Dapagliflozin] Diarrhea  . Simvastatin     myalgia    Family History  Problem Relation Age of Onset  . Stroke Mother   . Hypertension Mother   . Colon cancer Father   . Liver cancer Father   . Heart disease Sister   . Stroke Maternal Grandmother   . Hypertension Maternal Grandmother   . Esophageal cancer Maternal Grandfather   . Breast cancer Paternal Grandmother   . Brain cancer Paternal Grandmother   . Lung cancer Paternal Grandfather     Social History   Social History  . Marital status: Divorced    Spouse name: N/A  . Number of children: 0  . Years of education: N/A   Occupational History  . Office management     [employer]   Social History Main Topics  . Smoking status: Passive Smoke Exposure - Never Smoker    Types: Cigarettes  . Smokeless tobacco: Never Used  . Alcohol use 1.2 oz/week    2 Standard drinks or equivalent per week     Comment: 1-3/week  . Drug use: No  . Sexual activity: Yes   Other Topics Concern  . 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  HENT: Negative for congestion and sinus pain.   Respiratory: Negative for cough, sputum production, shortness of breath and wheezing.   Cardiovascular: Negative for chest pain, palpitations, claudication and leg swelling.  Gastrointestinal: Negative for abdominal pain, heartburn, nausea and vomiting.  Genitourinary: Negative for dysuria, frequency and  urgency.  Musculoskeletal: Negative for myalgias.  Skin: Negative for rash.  Neurological: Negative for dizziness, tingling, weakness and headaches.  Psychiatric/Behavioral:       Denies depressed or anxious mood today    BP 110/72 (BP Location: Left Arm, Patient Position: Sitting, Cuff Size: Normal)   Pulse 90   Temp 98.2 F (36.8 C) (Oral)   Ht '5\' 4"'  (1.626 m)   Wt 139 lb 6.4 oz (63.2 kg)   SpO2 97%   BMI 23.93 kg/m   Physical Exam  Constitutional: She is oriented to person, place, and time and well-developed, well-nourished, and in no distress.  Eyes: Pupils are equal, round, and reactive to  light. No scleral icterus.  Neck: Neck supple.  Cardiovascular: Normal rate and regular rhythm.   Pulmonary/Chest: Effort normal and breath sounds normal. She has no wheezes. She has no rales.  Abdominal: Soft. Bowel sounds are normal. There is no tenderness. There is no rebound.  Musculoskeletal: She exhibits no edema.  Lymphadenopathy:    She has no cervical adenopathy.  Neurological: She is alert and oriented to person, place, and time. Gait normal.  Skin: Skin is warm and dry. No rash noted.  Psychiatric: Mood, memory, affect and judgment normal.    Assessment/Plan: 1. Uncontrolled type 2 diabetes mellitus without complication, without long-term current use of insulin ([place]) Will obtain lab work today as patient report of home glucose readings in the 200s and she denies following dietary recommendations of carbohydrate monitoring. No symptoms of hypo/hyperglycemia reported today. Advised patient to continue metformin 500 mg BID as prescribed by her previous provider.   - Basic metabolic panel - CBC with Differential/Platelet - Lipid panel - Hemoglobin A1c - Hepatic function panel - Microalbumin / creatinine urine ratio  2. Essential hypertension Controlled with Maxzide; she has a history of Lasix use but does not do this at this time. Maxzide only for BP which is controlled.  Advised her to monitor her BP at least once or twice a week and report averages >140/90. DASH dietary recommendations provided.  3. Encounter to establish care We reviewed the PMH, PSH, FH, SH, Meds and Allergies. -We provided refills for any medications we will prescribe as needed. -We addressed current concerns per orders and patient instructions. -We have asked for records for pertinent exams, studies, vaccines and notes from previous providers. -We have advised patient to follow up per instructions below.  Follow up for a CPE in one month or sooner if needed for acute concerns. She will bring in her medication at her next visit so we can review all medications that she is currently taking. She received refills from her previous provider in October so no refills were needed today. Lab work ordered today for evaluation of DM control. She refuses all preventive immunizations including influenza today.  Delano Metz, FNP-C

## 2016-05-24 NOTE — Progress Notes (Signed)
Lab work for [employer] and hepatitis panel has been placed. Tradjenta prescribed with change in BP medication from Maxzide to Lisinopril. Recommend follow up in 3 days for lab work,  Initiate daily monitoring of blood sugar, low fat, heart healthy diet with monitoring of carbohydrates and follow up in one week for further evaluation of change in medication regimen or sooner if needed.

## 2016-05-25 NOTE — Progress Notes (Signed)
Noted  

## 2016-05-25 NOTE — Progress Notes (Signed)
Patient was notified but did not want to have the hepatitis screening done if not necessary. Advised to speak with Gregary Signs when she comes in for lab work on Monday 05/31/16.

## 2016-05-31 ENCOUNTER — Other Ambulatory Visit: Payer: Self-pay | Admitting: Family Medicine

## 2016-05-31 DIAGNOSIS — R7989 Other specified abnormal findings of blood chemistry: Secondary | ICD-10-CM

## 2016-05-31 DIAGNOSIS — R945 Abnormal results of liver function studies: Principal | ICD-10-CM

## 2016-05-31 NOTE — Progress Notes (Unsigned)
Elevated LFT have been present but are worsening. Screening for Hepatitis ordered with US abdomen for further evaluation and treatment.

## 2016-06-01 ENCOUNTER — Telehealth: Payer: Self-pay

## 2016-06-01 NOTE — Progress Notes (Signed)
Spoke to patient this morning about the order for her Abdominal US, patient declines to have the Abdominal US and the Hepatitis Screening done at this time. States that she just needs a repeat of her Potassium, sodium and Liver function after Christmas. Requested for a Lab Appointment with Lab on the 06/09/16.

## 2016-06-01 NOTE — Telephone Encounter (Signed)
Patient has declined recommendations for abdominal US and hepatitis screening in the presence of elevated LFTs. Further medication management will require evaluation of lab work and  in office follow up.

## 2016-06-03 ENCOUNTER — Telehealth: Payer: Self-pay | Admitting: Family Medicine

## 2016-06-03 NOTE — Telephone Encounter (Signed)
Pt state that she has had some swelling in both feet and ankles and will stop taking the Rx linagliptin (TRADJENTA) she thinks that the medication is making her swell she has not had swelling until she started taking that medication.

## 2016-06-03 NOTE — Telephone Encounter (Signed)
Gregary Signs, please see message and advise.

## 2016-06-03 NOTE — Telephone Encounter (Signed)
Advised follow up evaluation in office; She is scheduled with Dorothyann Peng 06/04/16.

## 2016-06-04 ENCOUNTER — Encounter: Payer: Self-pay | Admitting: Adult Health

## 2016-06-04 ENCOUNTER — Ambulatory Visit (INDEPENDENT_AMBULATORY_CARE_PROVIDER_SITE_OTHER): Payer: 59 | Admitting: Adult Health

## 2016-06-04 VITALS — BP 124/64 | Temp 99.0°F | Ht 64.0 in | Wt 140.8 lb

## 2016-06-04 DIAGNOSIS — M7989 Other specified soft tissue disorders: Secondary | ICD-10-CM | POA: Diagnosis not present

## 2016-06-04 DIAGNOSIS — E871 Hypo-osmolality and hyponatremia: Secondary | ICD-10-CM

## 2016-06-04 DIAGNOSIS — IMO0001 Reserved for inherently not codable concepts without codable children: Secondary | ICD-10-CM

## 2016-06-04 DIAGNOSIS — R748 Abnormal levels of other serum enzymes: Secondary | ICD-10-CM

## 2016-06-04 DIAGNOSIS — E1165 Type 2 diabetes mellitus with hyperglycemia: Secondary | ICD-10-CM | POA: Diagnosis not present

## 2016-06-04 LAB — BASIC METABOLIC PANEL
BUN: 9 mg/dL (ref 6–23)
CALCIUM: 8.7 mg/dL (ref 8.4–10.5)
CO2: 27 mEq/L (ref 19–32)
CREATININE: 0.59 mg/dL (ref 0.40–1.20)
Chloride: 98 mEq/L (ref 96–112)
GFR: 109.03 mL/min (ref >60.00)
Glucose, Bld: 169 mg/dL — ABNORMAL HIGH (ref 70–99)
Potassium: 3.8 mEq/L (ref 3.5–5.1)
Sodium: 132 mEq/L — ABNORMAL LOW (ref 135–145)

## 2016-06-04 MED ORDER — GLIPIZIDE 10 MG PO TABS: 10.0000 mg | ORAL_TABLET | Freq: Two times a day (BID) | ORAL | 3 refills | Status: DC

## 2016-06-04 NOTE — Progress Notes (Signed)
Subjective:    Patient ID: SEIDY LABRECK, female    DOB: Dec 29, 1950, 65 y.o.   MRN: [id number]  HPI  65 year old female who  has a past medical history of Allergy; Anxiety; Asthma; Depression; DM (diabetes mellitus) ([place]); GERD (gastroesophageal reflux disease); Hiatal hernia; adenomatous polyp of colon (07/07/2009); Hyperlipidemia; Hypertension; IBS (irritable bowel syndrome); Sinusitis, chronic; Thyroid nodule; and Vitamin D deficiency.  presents to the office today for an acute complaint of lower extremity swelling. She was recently started on Tradjenta by her PCP for uncontrolled diabetes. Her liver enzymes are elevated and her sodium is low at 126 as well as potassium at 3.2. From reading her providers notes. It appears as though she was advised to go for [place] of abdomen and have repeat labs drawn. She has refused these tests in the past.   She feels as though the swelling in her feet was caused from [place]. She has stopped taking the medication over the last 24 hours and the swelling has resolved.    Review of Systems  Constitutional: Negative.   Respiratory: Negative.   Cardiovascular: Positive for leg swelling.  Neurological: Negative.   All other systems reviewed and are negative.  Past Medical History:  Diagnosis Date  . Allergy   . Anxiety   . Asthma   . Depression   . DM (diabetes mellitus) ([place])   . GERD (gastroesophageal reflux disease)   . Hiatal hernia   . Hx of adenomatous polyp of colon 07/07/2009  . Hyperlipidemia   . Hypertension   . IBS (irritable bowel syndrome)   . Sinusitis, chronic   . Thyroid nodule   . Vitamin D deficiency     Social History   Social History  . Marital status: Divorced    Spouse name: N/A  . Number of children: 0  . Years of education: N/A   Occupational History  . Office management     [employer]   Social History Main Topics  . Smoking status: Passive Smoke Exposure - Never Smoker    Types: Cigarettes  .  Smokeless tobacco: Never Used  . Alcohol use 1.2 oz/week    2 Standard drinks or equivalent per week     Comment: 1-3/week  . Drug use: No  . Sexual activity: Yes   Other Topics Concern  . 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 CATARACT EXTRACTION Bilateral   . COLONOSCOPY  11/1991, 06/2009  . ESOPHAGOGASTRODUODENOSCOPY  02/2002  . EYE SURGERY    . NASAL SEPTOPLASTY W/ TURBINOPLASTY    . TONSILLECTOMY      Family History  Problem Relation Age of Onset  . Stroke Mother   . Hypertension Mother   . Colon cancer Father   . Liver cancer Father   . Heart disease Sister   . Stroke Maternal Grandmother   . Hypertension Maternal Grandmother   . Esophageal cancer Maternal Grandfather   . Breast cancer Paternal Grandmother   . Brain cancer Paternal Grandmother   . Lung cancer Paternal Grandfather     Allergies  Allergen Reactions  . Levofloxacin     hallucinations  . Prednisone     Anxiety and paranoia  . Amoxicillin Other (See Comments)    diarrhea  . Augmentin [Amoxicillin-Pot Clavulanate] Other (See Comments)    diarrhea  .  Farxiga [Dapagliflozin] Diarrhea  . Simvastatin     myalgia    Current Outpatient Prescriptions on File Prior to Visit  Medication Sig Dispense Refill  . ALPRAZolam (XANAX) 0.5 MG tablet Take 1 tablet (0.5 mg total) by mouth at bedtime as needed for anxiety. 90 tablet 1  . aspirin 325 MG tablet Take 325 mg by mouth daily as needed.     Marland Kitchen azelastine (OPTIVAR) 0.05 % ophthalmic solution instill 1 drop into affected eye DAILY 6 mL 11  . azithromycin (ZITHROMAX) 250 MG tablet Take 1 tablet (250 mg total) by mouth daily. Take first 2 tablets together, then 1 every day until finished. 6 tablet 0  . blood glucose meter kit and supplies KIT Dispense based on patient and insurance preference. Use up to four times  daily as directed. (FOR ICD-9 250.00, 250.01). 1 each 0  . cyclobenzaprine (FLEXERIL) 10 MG tablet take 1 tablet by mouth at bedtime if needed 30 tablet 5  . EFFEXOR XR 75 MG 24 hr capsule take 1 capsule by mouth twice a day WITH A MEAL 180 capsule 3  . Fluticasone-Salmeterol (ADVAIR DISKUS) 100-50 MCG/DOSE AEPB Inhale 1 puff into the lungs 2 (two) times daily. 60 each 11  . furosemide (LASIX) 20 MG tablet Take 1 tablet (20 mg total) by mouth daily as needed. 30 tablet 11  . glipiZIDE (GLUCOTROL) 5 MG tablet Take 1 tablet (5 mg total) by mouth daily as needed. 30 tablet 5  . glucose blood test strip Use as instructed 100 each 12  . Lancets (ONETOUCH ULTRASOFT) lancets Use as instructed 100 each 12  . lansoprazole (PREVACID) 30 MG capsule Take 1 capsule (30 mg total) by mouth daily. 90 capsule 3  . linagliptin (TRADJENTA) 5 MG TABS tablet Take 1 tablet (5 mg total) by mouth daily. 30 tablet 1  . lisinopril (PRINIVIL,ZESTRIL) 10 MG tablet Take 1 tablet (10 mg total) by mouth daily. 30 tablet 1  . metFORMIN (GLUCOPHAGE) 500 MG tablet Take 1 tablet (500 mg total) by mouth 2 (two) times daily with a meal. 180 tablet 3  . montelukast (SINGULAIR) 10 MG tablet TAKE 1 TABLET BY MOUTH AT BEDTIME. 90 tablet 3  . oxyCODONE-acetaminophen (PERCOCET/ROXICET) 5-325 MG tablet Take 1 tablet by mouth every 8 (eight) hours as needed. 30 tablet 0  . promethazine (PHENERGAN) 12.5 MG tablet Take 1 tablet (12.5 mg total) by mouth every 8 (eight) hours as needed for nausea or vomiting. 20 tablet 11  . triamterene-hydrochlorothiazide (MAXZIDE) 75-50 MG tablet Take 1 tablet by mouth daily. 90 tablet 3   No current facility-administered medications on file prior to visit.     BP 124/64   Temp 99 F (37.2 C) (Oral)   Ht '5\' 4"'  (1.626 m)   Wt 140 lb 12.8 oz (63.9 kg)   BMI 24.17 kg/m       Objective:   Physical Exam  Constitutional: She is oriented to person, place, and time. She appears well-developed and  well-nourished. No distress.  Cardiovascular: Normal rate, regular rhythm, normal heart sounds and intact distal pulses.  Exam reveals no gallop and no friction rub.   No murmur heard. Pulmonary/Chest: Effort normal and breath sounds normal. No respiratory distress. She has no wheezes. She has no rales. She exhibits no tenderness.  Musculoskeletal: Normal range of motion. She exhibits no edema, tenderness or deformity.  Neurological: She is alert and oriented to person, place, and time.  Skin: Skin is warm and dry. No rash  noted. She is not diaphoretic. No erythema. No pallor.  Psychiatric: She has a normal mood and affect. Her behavior is normal. Judgment and thought content normal.  Nursing note and vitals reviewed.     Assessment & Plan:  1. Low sodium levels - Basic metabolic panel  2. Elevated liver enzymes - She is willing to do [place] of liver  - Hepatitis panel, acute  3. Localized swelling of lower extremity - Has resolved. Advised patient that lower extremity swelling is not on the side effect profile, per up to date on Tradjenta. Possibly from electrolyte disfunction. She refused to believe this. She is willing to go up on glipizide and will take twice daily.   4. Uncontrolled type 2 diabetes mellitus without complication, without long-term current use of insulin ([facility]) - d/c Tradjenta - glipiZIDE (GLUCOTROL) 10 MG tablet; Take 1 tablet (10 mg total) by mouth 2 (two) times daily after a meal.  Dispense: 60 tablet; Refill: 3 - Monitor BS at home and bring log to next appointment   Dorothyann Peng, NP

## 2016-06-05 LAB — HEPATITIS PANEL, ACUTE
HCV Ab: NEGATIVE
HEP B S AG: NEGATIVE
Hep A IgM: NONREACTIVE
Hep B C IgM: NONREACTIVE

## 2016-06-09 ENCOUNTER — Ambulatory Visit
Admission: RE | Admit: 2016-06-09 | Discharge: 2016-06-09 | Disposition: A | Discharge status: Home / Self Care | Payer: 59 | Source: Ambulatory Visit | Attending: Family Medicine | Admitting: Family Medicine

## 2016-06-09 ENCOUNTER — Other Ambulatory Visit: Payer: 59

## 2016-06-09 DIAGNOSIS — R945 Abnormal results of liver function studies: Principal | ICD-10-CM

## 2016-06-09 DIAGNOSIS — R7989 Other specified abnormal findings of blood chemistry: Secondary | ICD-10-CM

## 2016-06-10 ENCOUNTER — Other Ambulatory Visit: Payer: Self-pay | Admitting: Family Medicine

## 2016-06-10 DIAGNOSIS — R935 Abnormal findings on diagnostic imaging of other abdominal regions, including retroperitoneum: Secondary | ICD-10-CM

## 2016-06-10 NOTE — Progress Notes (Unsigned)
Findings of abdominal US indicate a questionable mass versus prominent lymph node at the junction of the head and body of the pancreas. Will provider further evaluation urgently with CT of abdomen with contrast. Attempted to contact patient via telephone but was unable to reach her. Will continue to contact patient to review the findings and advise further evaluation with CT.

## 2016-06-11 ENCOUNTER — Telehealth: Payer: Self-pay | Admitting: Family Medicine

## 2016-06-11 NOTE — Telephone Encounter (Signed)
Spoke with patient regarding results of abdominal US which indicates a questionable mass versus prominent enlarged lymph node at the junction of the head and body of the pancreas. Advised abdominal CT with contrast for further evaluation. Order has been placed.

## 2016-06-11 NOTE — Progress Notes (Signed)
Called the patient and left a detailed message for a call back in the clinic.

## 2016-06-15 ENCOUNTER — Telehealth: Payer: Self-pay | Admitting: Family Medicine

## 2016-06-15 NOTE — Telephone Encounter (Signed)
Miranda Cochran with [facility] imaging needs to have the order changed to a  CT Abdomen with and without contrast. They need to do a pancreatic protocol due to the ultrasound report. This will give the best report. Miranda Cochran is going to change the order in [place] in [place] will go in and sigh off on the order.   Any questions, feel free to call her back at number above.

## 2016-06-15 NOTE — Progress Notes (Signed)
Miranda Cochran spoke to the patient.

## 2016-06-16 ENCOUNTER — Other Ambulatory Visit: Payer: 59

## 2016-06-16 ENCOUNTER — Ambulatory Visit
Admission: RE | Admit: 2016-06-16 | Discharge: 2016-06-16 | Disposition: A | Discharge status: Home / Self Care | Payer: 59 | Source: Ambulatory Visit | Attending: Family Medicine | Admitting: Family Medicine

## 2016-06-16 DIAGNOSIS — R935 Abnormal findings on diagnostic imaging of other abdominal regions, including retroperitoneum: Secondary | ICD-10-CM

## 2016-06-16 MED ORDER — IOPAMIDOL (ISOVUE-300) INJECTION 61%
100.0000 mL | Freq: Once | INTRAVENOUS | Status: AC | PRN
  Administered 2016-06-16: 100 mL via INTRAVENOUS

## 2016-06-18 ENCOUNTER — Other Ambulatory Visit: Payer: Self-pay | Admitting: Family Medicine

## 2016-06-18 DIAGNOSIS — K7460 Unspecified cirrhosis of liver: Secondary | ICD-10-CM

## 2016-06-18 NOTE — Progress Notes (Signed)
Contacted patient today regarding results of CT which indicates hepatic cirrhosis. Referral to GI placed today and also advised patient to avoid ETOH, NSAIDs, and other supplements and OTC medications at this time. She voiced understanding and agreed with plan.

## 2016-06-27 ENCOUNTER — Ambulatory Visit (HOSPITAL_COMMUNITY)
Admission: EM | Admit: 2016-06-27 | Discharge: 2016-06-27 | Disposition: A | Discharge status: Home / Self Care | Payer: 59 | Attending: Emergency Medicine | Admitting: Emergency Medicine

## 2016-06-27 ENCOUNTER — Encounter (HOSPITAL_COMMUNITY): Payer: Self-pay | Admitting: *Deleted

## 2016-06-27 DIAGNOSIS — J0140 Acute pansinusitis, unspecified: Secondary | ICD-10-CM | POA: Diagnosis not present

## 2016-06-27 MED ORDER — AZITHROMYCIN 250 MG PO TABS: 250.0000 mg | ORAL_TABLET | Freq: Every day | ORAL | 0 refills | Status: DC

## 2016-06-27 MED ORDER — GUAIFENESIN-CODEINE 100-10 MG/5ML PO SOLN: 5.0000 mL | Freq: Three times a day (TID) | ORAL | 0 refills | Status: DC | PRN

## 2016-06-27 NOTE — ED Triage Notes (Signed)
Reports waking up 10 days ago with sore throat, congestion, fatigue.  C/O nasal discharge with multiple episodes epistaxis this past week.  C/O continued head congestion.  Has been taking Mucinex, Sudafed, Benadryl, Zyrtec, ASA, and Advil without improvement.

## 2016-06-27 NOTE — ED Provider Notes (Signed)
CSN: [id number]     Arrival date & time 06/27/16  1859 History   None    No chief complaint on file.  (Consider location/radiation/quality/duration/timing/severity/associated sxs/prior Treatment) Patient presents with 10 day history of cough, sinus congestion, sinus pressure, and dental pain. Cough is worse at night, dry, hacking, green sputum. She reports when she blows her nose, there is blood tinged sputum. She denies fever, shortness of breath, or wheezing. She has no nausea, vomiting, or diarrhea.   The history is provided by the patient.    Past Medical History:  Diagnosis Date  . Allergy   . Anxiety   . Asthma   . Depression   . DM (diabetes mellitus) ([place])   . GERD (gastroesophageal reflux disease)   . Hiatal hernia   . Hx of adenomatous polyp of colon 07/07/2009  . Hyperlipidemia   . Hypertension   . IBS (irritable bowel syndrome)   . Sinusitis, chronic   . Thyroid nodule   . Vitamin D deficiency    Past Surgical History:  Procedure Laterality Date  . CATARACT EXTRACTION Bilateral   . COLONOSCOPY  11/1991, 06/2009  . ESOPHAGOGASTRODUODENOSCOPY  02/2002  . EYE SURGERY    . NASAL SEPTOPLASTY W/ TURBINOPLASTY    . TONSILLECTOMY     Family History  Problem Relation Age of Onset  . Stroke Mother   . Hypertension Mother   . Colon cancer Father   . Liver cancer Father   . Heart disease Sister   . Stroke Maternal Grandmother   . Hypertension Maternal Grandmother   . Esophageal cancer Maternal Grandfather   . Breast cancer Paternal Grandmother   . Brain cancer Paternal Grandmother   . Lung cancer Paternal Grandfather    Social History  Substance Use Topics  . Smoking status: Never Smoker  . Smokeless tobacco: Never Used  . Alcohol use Yes   OB History    No data available     Review of Systems  Reason unable to perform ROS: as covered in HPI.  All other systems reviewed and are negative.   Allergies  Levofloxacin; Prednisone; Amoxicillin; Augmentin  [amoxicillin-pot clavulanate]; Farxiga [dapagliflozin]; and Simvastatin  Home Medications   Prior to Admission medications   Medication Sig Start Date End Date Taking? Authorizing Provider  ALPRAZolam Duanne Moron) 0.5 MG tablet Take 1 tablet (0.5 mg total) by mouth at bedtime as needed for anxiety. 04/01/16  Yes Robyn Haber, MD  aspirin 325 MG tablet Take 325 mg by mouth daily as needed.    Yes Historical Provider, MD  azelastine (OPTIVAR) 0.05 % ophthalmic solution instill 1 drop into affected eye DAILY 04/01/16  Yes Robyn Haber, MD  blood glucose meter kit and supplies KIT Dispense based on patient and insurance preference. Use up to four times daily as directed. (FOR ICD-9 250.00, 250.01). 05/20/15  Yes Robyn Haber, MD  cyclobenzaprine (FLEXERIL) 10 MG tablet take 1 tablet by mouth at bedtime if needed 04/01/16  Yes Robyn Haber, MD  EFFEXOR XR 75 MG 24 hr capsule take 1 capsule by mouth twice a day WITH A MEAL 04/01/16  Yes Robyn Haber, MD  Fluticasone-Salmeterol (ADVAIR DISKUS) 100-50 MCG/DOSE AEPB Inhale 1 puff into the lungs 2 (two) times daily. 04/01/16  Yes Robyn Haber, MD  furosemide (LASIX) 20 MG tablet Take 1 tablet (20 mg total) by mouth daily as needed. 04/01/16  Yes Robyn Haber, MD  glipiZIDE (GLUCOTROL) 10 MG tablet Take 1 tablet (10 mg total) by mouth 2 (two) times  daily after a meal. 06/04/16  Yes Dorothyann Peng, NP  glucose blood test strip Use as instructed 04/01/16  Yes Robyn Haber, MD  Lancets [facility] ULTRASOFT) lancets Use as instructed 04/01/16  Yes Robyn Haber, MD  lansoprazole (PREVACID) 30 MG capsule Take 1 capsule (30 mg total) by mouth daily. 04/01/16  Yes Robyn Haber, MD  lisinopril (PRINIVIL,ZESTRIL) 10 MG tablet Take 1 tablet (10 mg total) by mouth daily. 05/24/16  Yes Delano Metz, FNP  metFORMIN (GLUCOPHAGE) 500 MG tablet Take 1 tablet (500 mg total) by mouth 2 (two) times daily with a meal. 04/01/16  Yes Robyn Haber, MD   montelukast (SINGULAIR) 10 MG tablet TAKE 1 TABLET BY MOUTH AT BEDTIME. 04/01/16  Yes Robyn Haber, MD  oxyCODONE-acetaminophen (PERCOCET/ROXICET) 5-325 MG tablet Take 1 tablet by mouth every 8 (eight) hours as needed. 04/01/16  Yes Robyn Haber, MD  promethazine (PHENERGAN) 12.5 MG tablet Take 1 tablet (12.5 mg total) by mouth every 8 (eight) hours as needed for nausea or vomiting. 04/01/16  Yes Robyn Haber, MD  azithromycin (ZITHROMAX) 250 MG tablet Take 1 tablet (250 mg total) by mouth daily. Take first 2 tablets together, then 1 every day until finished. 04/01/16   Robyn Haber, MD  azithromycin (ZITHROMAX) 250 MG tablet Take 1 tablet (250 mg total) by mouth daily. Take first 2 tablets together, then 1 every day until finished. 06/27/16   Barnet Glasgow, NP  guaiFENesin-codeine 100-10 MG/5ML syrup Take 5 mLs by mouth 3 (three) times daily as needed for cough. 06/27/16   Barnet Glasgow, NP  triamterene-hydrochlorothiazide (MAXZIDE) 75-50 MG tablet Take 1 tablet by mouth daily. 04/01/16   Robyn Haber, MD   Meds Ordered and Administered this Visit  Medications - No data to display  BP 134/59   Pulse 94   Temp 98.5 F (36.9 C) (Oral)   Resp 18   SpO2 100%  No data found.   Physical Exam  Constitutional: She is oriented to person, place, and time. She appears well-developed and well-nourished. She does not have a sickly appearance. She does not appear ill. No distress.  HENT:  Right Ear: Tympanic membrane and external ear normal.  Left Ear: Tympanic membrane and external ear normal.  Nose: Right sinus exhibits maxillary sinus tenderness and frontal sinus tenderness. Left sinus exhibits maxillary sinus tenderness and frontal sinus tenderness.  Mouth/Throat: Uvula is midline and oropharynx is clear and moist. No oropharyngeal exudate.  Eyes: Pupils are equal, round, and reactive to light.  Neck: Normal range of motion. Neck supple. No JVD present.  Cardiovascular:  Normal rate and regular rhythm.   Pulmonary/Chest: Effort normal and breath sounds normal. No respiratory distress. She has no wheezes.  Abdominal: Soft. Bowel sounds are normal. She exhibits no distension. There is no tenderness. There is no guarding.  Lymphadenopathy:    She has no cervical adenopathy.  Neurological: She is alert and oriented to person, place, and time.  Skin: Skin is warm and dry. Capillary refill takes less than 2 seconds. She is not diaphoretic.  Psychiatric: She has a normal mood and affect.  Nursing note and vitals reviewed.   Urgent Care Course   Clinical Course     Procedures (including critical care time)  Labs Review Labs Reviewed - No data to display  Imaging Review No results found.   Visual Acuity Review  Right Eye Distance:   Left Eye Distance:   Bilateral Distance:    Right Eye Near:   Left Eye Near:  Bilateral Near:         MDM   1. Acute non-recurrent pansinusitis   You have acute sinusitis. I have written an antibiotic for your infection, Azithromycin, take 2 tablets now, then 1 tablet daily till finished. I have written for Tussinex with codeine, take 5 mls 3 times a day for cough, do not drive or drink alcohol while taking this medication. You may use tylenol over the counter for additional pain control ever 4 hours along with decongestants such as pseudoephedrine or antihistamines like claritin.  Should your symptoms fail to improve follow up with your PCP or return to clinic.     Barnet Glasgow, NP 06/27/16 2043

## 2016-06-27 NOTE — Discharge Instructions (Signed)
You have acute sinusitis. I have written an antibiotic for your infection, Azithromycin, take 2 tablets now, then 1 tablet daily till finished. I have written for Tussinex with codeine, take 5 mls 3 times a day for cough, do not drive or drink alcohol while taking this medication. You may use tylenol over the counter for additional pain control ever 4 hours along with decongestants such as pseudoephedrine or antihistamines like claritin.  Should your symptoms fail to improve follow up with your PCP or return to clinic.

## 2016-07-30 ENCOUNTER — Ambulatory Visit (INDEPENDENT_AMBULATORY_CARE_PROVIDER_SITE_OTHER): Payer: 59 | Admitting: Internal Medicine

## 2016-07-30 ENCOUNTER — Encounter: Payer: Self-pay | Admitting: Internal Medicine

## 2016-07-30 ENCOUNTER — Other Ambulatory Visit (INDEPENDENT_AMBULATORY_CARE_PROVIDER_SITE_OTHER): Payer: 59

## 2016-07-30 VITALS — BP 100/68 | HR 96 | Ht 65.0 in | Wt 141.4 lb

## 2016-07-30 DIAGNOSIS — R945 Abnormal results of liver function studies: Secondary | ICD-10-CM

## 2016-07-30 DIAGNOSIS — R52 Pain, unspecified: Secondary | ICD-10-CM

## 2016-07-30 DIAGNOSIS — R932 Abnormal findings on diagnostic imaging of liver and biliary tract: Secondary | ICD-10-CM

## 2016-07-30 DIAGNOSIS — R771 Abnormality of globulin: Secondary | ICD-10-CM | POA: Diagnosis not present

## 2016-07-30 DIAGNOSIS — R7989 Other specified abnormal findings of blood chemistry: Secondary | ICD-10-CM

## 2016-07-30 LAB — CBC WITH DIFFERENTIAL/PLATELET
Basophils Absolute: 0 10*3/uL (ref 0.0–0.1)
Basophils Relative: 0.2 % (ref 0.0–3.0)
EOS PCT: 3.6 % (ref 0.0–5.0)
Eosinophils Absolute: 0.2 10*3/uL (ref 0.0–0.7)
HCT: 29.4 % — ABNORMAL LOW (ref 36.0–46.0)
Hemoglobin: 9.8 g/dL — ABNORMAL LOW (ref 12.0–15.0)
LYMPHS ABS: 2.6 10*3/uL (ref 0.7–4.0)
Lymphocytes Relative: 39.4 % (ref 12.0–46.0)
MCHC: 33.2 g/dL (ref 30.0–36.0)
MCV: 87.9 fl (ref 78.0–100.0)
MONO ABS: 0.6 10*3/uL (ref 0.1–1.0)
MONOS PCT: 9.7 % (ref 3.0–12.0)
NEUTROS ABS: 3.1 10*3/uL (ref 1.4–7.7)
NEUTROS PCT: 47.1 % (ref 43.0–77.0)
PLATELETS: 222 10*3/uL (ref 150.0–400.0)
RBC: 3.34 Mil/uL — ABNORMAL LOW (ref 3.87–5.11)
RDW: 14.5 % (ref 11.5–15.5)
WBC: 6.7 10*3/uL (ref 4.0–10.5)

## 2016-07-30 LAB — PROTIME-INR
INR: 1.3 ratio — ABNORMAL HIGH (ref 0.8–1.0)
PROTHROMBIN TIME: 13.3 s — AB (ref 9.6–13.1)

## 2016-07-30 LAB — COMPREHENSIVE METABOLIC PANEL
ALK PHOS: 152 U/L — AB (ref 39–117)
ALT: 118 U/L — AB (ref 0–35)
AST: 141 U/L — ABNORMAL HIGH (ref 0–37)
Albumin: 3.4 g/dL — ABNORMAL LOW (ref 3.5–5.2)
BILIRUBIN TOTAL: 0.8 mg/dL (ref 0.2–1.2)
BUN: 10 mg/dL (ref 6–23)
CO2: 26 meq/L (ref 19–32)
Calcium: 9.4 mg/dL (ref 8.4–10.5)
Chloride: 97 mEq/L (ref 96–112)
Creatinine, Ser: 0.55 mg/dL (ref 0.40–1.20)
GFR: 118.17 mL/min (ref >60.00)
GLUCOSE: 164 mg/dL — AB (ref 70–99)
POTASSIUM: 3.6 meq/L (ref 3.5–5.1)
Sodium: 129 mEq/L — ABNORMAL LOW (ref 135–145)
TOTAL PROTEIN: 9.2 g/dL — AB (ref 6.0–8.3)

## 2016-07-30 LAB — FERRITIN: Ferritin: 9.4 ng/mL — ABNORMAL LOW (ref 10.0–291.0)

## 2016-07-30 LAB — IGA: IGA: 374 mg/dL (ref 68–378)

## 2016-07-30 MED ORDER — EFFEXOR XR 75 MG PO CP24: ORAL_CAPSULE | ORAL | 2 refills | Status: DC

## 2016-07-30 NOTE — Progress Notes (Signed)
Miranda Cochran 65 y.o. 1950/12/30 [id number] Referred by: Delano Metz, FNP  Assessment & Plan:   Encounter Diagnoses  Name Primary?  . Abnormal LFTs Yes  . Abnormal liver CT   . Elevated serum globulin level    LabsTo include repeat liver functions, electrolytes and kidney function, CBC, INR, ANA, mitochondrial antibodies, SPEP, anti-smooth muscle antibody, IgG level, IgA level and a TTG antibody to work this up further. I'm suspicious of autoimmune hepatitis. Liverbx is indicated no matter what I think so we will order that. Judicious EtOH intake. Steroids would be a problem  if she has autoimmune hepatitis because of diabetes and she is intolerant of prednisone. Budesonide is a potential option though could still cause significant problems with hyperglycemia. Await serologic workup and See results. I've explained the risks benefits and indications of biopsy of the liver.  She has a slightly lobular contour of the liver ID look at the CT scan myself. I don't see good evidence for significant problems like portal hypertension, platelets are normal there is no varices or anything like that her ascites seen on the CT scan. The slightly enlarged lymph nodes in the portal peripancreatic area or probably related to an inflammatory process in the liver.  We'll arrange follow-up once the results of the studies are back. I appreciate the opportunity to care for this patient. CC: Laurita Quint, FNP   Subjective:   Chief Complaint: Abnormal liver on CT  HPI Miranda Cochran is someone and I've known for several years treating and following for IBS heard and associated anxiety, also has a history of colon polyps and a family history of colon cancer. Patient is here because of an abnormal liver on CT and abnormal LFTs. She recently changed to Delano Metz, [place], the patient has uncontrolled diabetes mellitus type 2 of recent onset. She had noticed abnormal transaminases and LFTs,  she's had these going back a couple of years or more, ultrasound suggested an abnormal lymph node near the pancreas and then a CT scan demonstrated a slightly lobular contour of the liver suggesting possible cirrhosis, and she also has periportal and peripancreatic lymphadenopathy though very mild. No mass lesions. She generally feels okay at this time. Far infectious hepatitis screening is negative. Rare or occasional alcohol nothing significant reported. No family history of liver disease. He has had mild increase in transaminases off and on going back 10 years. Perhaps longer. They're clearly worse now. Months ago she had AST 140 ALT 129 alkaline phosphatase 133 and slightly elevated total bilirubin. She had a total protein of 9.6 with albumin of 3.5 which is a globulin fraction of 6.  Allergies  Allergen Reactions  . Levofloxacin     hallucinations  . Prednisone     Anxiety and paranoia  . Amoxicillin Other (See Comments)    diarrhea  . Augmentin [Amoxicillin-Pot Clavulanate] Other (See Comments)    diarrhea  . Farxiga [Dapagliflozin] Diarrhea  . Simvastatin     myalgia   Current Meds  Medication Sig  . ALPRAZolam (XANAX) 0.5 MG tablet Take 1 tablet (0.5 mg total) by mouth at bedtime as needed for anxiety.  Marland Kitchen aspirin 325 MG tablet Take 325 mg by mouth daily as needed.   Marland Kitchen azelastine (OPTIVAR) 0.05 % ophthalmic solution instill 1 drop into affected eye DAILY  . blood glucose meter kit and supplies KIT Dispense based on patient and insurance preference. Use up to four times daily as directed. (FOR ICD-9 250.00, 250.01).  Marland Kitchen  cyclobenzaprine (FLEXERIL) 10 MG tablet take 1 tablet by mouth at bedtime if needed  . EFFEXOR XR 75 MG 24 hr capsule take 1 capsule by mouth twice a day WITH A MEAL  . Fluticasone-Salmeterol (ADVAIR DISKUS) 100-50 MCG/DOSE AEPB Inhale 1 puff into the lungs 2 (two) times daily.  . furosemide (LASIX) 20 MG tablet Take 1 tablet (20 mg total) by mouth daily as needed.  Marland Kitchen  glipiZIDE (GLUCOTROL) 10 MG tablet Take 1 tablet (10 mg total) by mouth 2 (two) times daily after a meal.  . glucose blood test strip Use as instructed  . Lancets (ONETOUCH ULTRASOFT) lancets Use as instructed  . lansoprazole (PREVACID) 30 MG capsule Take 1 capsule (30 mg total) by mouth daily.  . metFORMIN (GLUCOPHAGE) 500 MG tablet Take 1 tablet (500 mg total) by mouth 2 (two) times daily with a meal.  . montelukast (SINGULAIR) 10 MG tablet TAKE 1 TABLET BY MOUTH AT BEDTIME.  Marland Kitchen oxyCODONE-acetaminophen (PERCOCET/ROXICET) 5-325 MG tablet Take 1 tablet by mouth every 8 (eight) hours as needed.  . promethazine (PHENERGAN) 12.5 MG tablet Take 1 tablet (12.5 mg total) by mouth every 8 (eight) hours as needed for nausea or vomiting.  . triamterene-hydrochlorothiazide (MAXZIDE) 75-50 MG tablet Take 1 tablet by mouth daily.  . [DISCONTINUED] EFFEXOR XR 75 MG 24 hr capsule take 1 capsule by mouth twice a day WITH A MEAL   Past Medical History:  Diagnosis Date  . Allergy   . Anxiety   . Asthma   . Depression   . DM (diabetes mellitus) ([place])   . GERD (gastroesophageal reflux disease)   . Hepatic cirrhosis ([place])   . Hiatal hernia   . Hx of adenomatous polyp of colon 07/07/2009  . Hyperlipidemia   . Hypertension   . IBS (irritable bowel syndrome)   . Sinusitis, chronic   . Thyroid nodule   . Umbilical hernia   . Uterine fibroid   . Vascular disease   . Vitamin D deficiency    Past Surgical History:  Procedure Laterality Date  . CATARACT EXTRACTION Bilateral   . COLONOSCOPY  11/1991, 06/2009  . ESOPHAGOGASTRODUODENOSCOPY  02/2002  . EYE SURGERY    . NASAL SEPTOPLASTY W/ TURBINOPLASTY    . TONSILLECTOMY     Social History   Social History  . Marital status: Divorced    Spouse name: N/A  . Number of children: 0  . Years of education: N/A   Occupational History  . Office management     [employer]   Social History Main Topics  . Smoking status: Never Smoker  . Smokeless  tobacco: Never Used  . Alcohol use Yes  . Drug use: No  . Sexual activity: Not Asked   Other Topics Concern  . 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  Review of Systems As above. Still working on getting better blood sugar control.  Objective:   Physical Exam _0  100/68 (BP Location: Left Arm, Patient Position: Sitting, Cuff Size: Normal)   Pulse 96   Ht _1  (1.651 m) Comment: height  measured without shoes  Wt 141 lb 6 oz (64.1 kg)   BMI 23.53 kg/m @  General:  Well-developed, well-nourished and in no acute distress Eyes:  anicteric. Lungs: Clear to auscultation bilaterally. Heart:  S1S2, no rubs, murmurs, gallops. Abdomen:  soft, non-tender, no hepatosplenomegaly, hernia, or mass and BS+.  Extremities:   no edema, cyanosis or clubbing Skin   no rash. Neuro:  A&O x 3.  Psych:  appropriate mood and  Affect.   Data Reviewed:  See HPI and ass/plan

## 2016-07-30 NOTE — Patient Instructions (Addendum)
  Your physician has requested that you go to the basement for the lab work before leaving today.   You will be contacted by the hospital to set up the liver biopsy time and date.  We have sent the following medications to your pharmacy for you to pick up at your convenience: Effexor(Brand name) per patient request can only use brand name  I appreciate the opportunity to care for you. Silvano Rusk, MD, [facility]

## 2016-08-02 LAB — ANTI-NUCLEAR AB-TITER (ANA TITER): ANA Titer 1: 1:640 {titer} — ABNORMAL HIGH

## 2016-08-02 LAB — IGG: IGG (IMMUNOGLOBIN G), SERUM: 3740 mg/dL — AB (ref 694–1618)

## 2016-08-02 LAB — ANTI-SMOOTH MUSCLE ANTIBODY, IGG: Smooth Muscle Ab: 112 U — ABNORMAL HIGH (ref <20)

## 2016-08-02 LAB — TISSUE TRANSGLUTAMINASE, IGA: Tissue Transglutaminase Ab, IgA: 1 U/mL (ref <4)

## 2016-08-02 LAB — MITOCHONDRIAL ANTIBODIES: Mitochondrial M2 Ab, IgG: <=20 Units (ref <=20.0)

## 2016-08-02 LAB — ANA: ANA: POSITIVE — AB

## 2016-08-02 NOTE — Progress Notes (Signed)
Looking like autoimmune hepatitis Message to patient Good pickup by PCP

## 2016-08-03 LAB — PROTEIN ELECTROPHORESIS, SERUM
Albumin ELP: 3.6 g/dL — ABNORMAL LOW (ref 3.8–4.8)
Alpha-1-Globulin: 0.3 g/dL (ref 0.2–0.3)
Alpha-2-Globulin: 0.6 g/dL (ref 0.5–0.9)
Beta 2: 0.5 g/dL (ref 0.2–0.5)
Beta Globulin: 0.6 g/dL (ref 0.4–0.6)
Gamma Globulin: 3.7 g/dL — ABNORMAL HIGH (ref 0.8–1.7)
Total Protein, Serum Electrophoresis: 9.2 g/dL — ABNORMAL HIGH (ref 6.1–8.1)

## 2016-08-11 ENCOUNTER — Other Ambulatory Visit: Payer: Self-pay | Admitting: Radiology

## 2016-08-12 ENCOUNTER — Other Ambulatory Visit: Payer: Self-pay | Admitting: Radiology

## 2016-08-13 ENCOUNTER — Ambulatory Visit (HOSPITAL_COMMUNITY)
Admission: RE | Admit: 2016-08-13 | Discharge: 2016-08-13 | Disposition: A | Discharge status: Home / Self Care | Payer: 59 | Source: Ambulatory Visit | Attending: Internal Medicine | Admitting: Internal Medicine

## 2016-08-13 ENCOUNTER — Encounter (HOSPITAL_COMMUNITY): Payer: Self-pay

## 2016-08-13 DIAGNOSIS — Z880 Allergy status to penicillin: Secondary | ICD-10-CM | POA: Diagnosis not present

## 2016-08-13 DIAGNOSIS — R7989 Other specified abnormal findings of blood chemistry: Secondary | ICD-10-CM | POA: Insufficient documentation

## 2016-08-13 DIAGNOSIS — Z7984 Long term (current) use of oral hypoglycemic drugs: Secondary | ICD-10-CM | POA: Insufficient documentation

## 2016-08-13 DIAGNOSIS — R932 Abnormal findings on diagnostic imaging of liver and biliary tract: Secondary | ICD-10-CM | POA: Insufficient documentation

## 2016-08-13 DIAGNOSIS — K754 Autoimmune hepatitis: Secondary | ICD-10-CM | POA: Insufficient documentation

## 2016-08-13 DIAGNOSIS — Z79899 Other long term (current) drug therapy: Secondary | ICD-10-CM | POA: Insufficient documentation

## 2016-08-13 DIAGNOSIS — R771 Abnormality of globulin: Secondary | ICD-10-CM | POA: Diagnosis not present

## 2016-08-13 DIAGNOSIS — Z7982 Long term (current) use of aspirin: Secondary | ICD-10-CM | POA: Insufficient documentation

## 2016-08-13 DIAGNOSIS — R945 Abnormal results of liver function studies: Secondary | ICD-10-CM

## 2016-08-13 DIAGNOSIS — K732 Chronic active hepatitis, not elsewhere classified: Secondary | ICD-10-CM | POA: Diagnosis not present

## 2016-08-13 DIAGNOSIS — R748 Abnormal levels of other serum enzymes: Secondary | ICD-10-CM | POA: Insufficient documentation

## 2016-08-13 DIAGNOSIS — K7460 Unspecified cirrhosis of liver: Secondary | ICD-10-CM | POA: Insufficient documentation

## 2016-08-13 LAB — CBC
HCT: 27.9 % — ABNORMAL LOW (ref 36.0–46.0)
Hemoglobin: 8.9 g/dL — ABNORMAL LOW (ref 12.0–15.0)
MCH: 27.6 pg (ref 26.0–34.0)
MCHC: 31.9 g/dL (ref 30.0–36.0)
MCV: 86.6 fL (ref 78.0–100.0)
PLATELETS: 195 10*3/uL (ref 150–400)
RBC: 3.22 MIL/uL — ABNORMAL LOW (ref 3.87–5.11)
RDW: 14 % (ref 11.5–15.5)
WBC: 6.5 10*3/uL (ref 4.0–10.5)

## 2016-08-13 LAB — GLUCOSE, CAPILLARY: Glucose-Capillary: 141 mg/dL — ABNORMAL HIGH (ref 65–99)

## 2016-08-13 LAB — PROTIME-INR
INR: 1.15
Prothrombin Time: 14.8 seconds (ref 11.4–15.2)

## 2016-08-13 LAB — APTT: aPTT: 35 seconds (ref 24–36)

## 2016-08-13 MED ORDER — MIDAZOLAM HCL 2 MG/2ML IJ SOLN
INTRAMUSCULAR | Status: AC
  Filled 2016-08-13: qty 6

## 2016-08-13 MED ORDER — HYDROMORPHONE HCL 2 MG/ML IJ SOLN
INTRAMUSCULAR | Status: AC
  Filled 2016-08-13: qty 1

## 2016-08-13 MED ORDER — SODIUM CHLORIDE 0.9 % IV SOLN
INTRAVENOUS | Status: DC
  Administered 2016-08-13: 12:00:00 via INTRAVENOUS

## 2016-08-13 MED ORDER — HYDROMORPHONE HCL 1 MG/ML IJ SOLN
INTRAMUSCULAR | Status: AC | PRN
  Administered 2016-08-13 (×3): 0.5 mg via INTRAVENOUS

## 2016-08-13 MED ORDER — MIDAZOLAM HCL 2 MG/2ML IJ SOLN
INTRAMUSCULAR | Status: AC | PRN
  Administered 2016-08-13 (×3): 1 mg via INTRAVENOUS

## 2016-08-13 NOTE — Discharge Instructions (Signed)
Liver Biopsy The liver is a large organ in the upper right-hand side of your abdomen. A liver biopsy is a procedure in which a tissue sample is taken from the liver and examined under a microscope. The procedure is done to confirm a suspected problem. There are three types of liver biopsies:  Percutaneous. In this type, an incision is made in your abdomen. The sample is removed through the incision with a needle.  Laparoscopic. In this type, several incisions are made in the abdomen. A tiny camera is passed through one of the incisions to help guide the health care provider. The sample is removed through the other incision or incisions.  Transjugular. In this type, an incision is made in the neck. A tube is passed through the incision to the liver. The sample is removed through the tube with a needle. Tell a health care provider about:  Any allergies you have.  All medicines you are taking, including vitamins, herbs, eye drops, creams, and over-the-counter medicines.  Any problems you or family members have had with anesthetic medicines.  Any blood disorders you have.  Any surgeries you have had.  Any medical conditions you have.  Possibility of pregnancy, if this applies. What are the risks? Generally, this is a safe procedure. However, problems can occur and include:  Bleeding.  Infection.  Bruising.  Collapsed lung.  Leak of digestive juices (bile) from the liver or gallbladder.  Problems with heart rhythm.  Pain at the biopsy site or in the right shoulder.  Low blood pressure (hypotension).  Injury to nearby organs or tissues. What happens before the procedure?  Your health care provider may do some blood or urine tests. These will help your health care provider learn how well your kidneys and liver are working and how well your blood clots.  Ask your health care provider if you will be able to go home the day of the procedure. Arrange for someone to take you home  and stay with you for at least 24 hours.  Do not eat or drink anything after midnight on the night before the procedure or as directed by your health care provider.  Ask your health care provider about:  Changing or stopping your regular medicines. This is especially important if you are taking diabetes medicines or blood thinners.  Taking medicines such as aspirin and ibuprofen. These medicines can thin your blood. Do not take these medicines before your procedure if your health care provider asks you not to. What happens during the procedure? Regardless of the type of biopsy that will be done, you will have an IV line placed. Through this line, you will receive fluids and medicine to relax you. If you will be having a laparoscopic biopsy, you may also receive medicine through this line to make you sleep during the procedure (general anesthetic). Percutaneous Liver Biopsy   You will positioned on your back, with your right hand over your head.  A health care provider will locate your liver by tapping and pressing on the right side of your abdomen or with the help of an ultrasound machine or CT scan.  An area at the bottom of your last right rib will be numbed.  An incision will be made in the numbed area.  The biopsy needle will be inserted into the incision.  Several samples of liver tissue will be taken with the biopsy needle. You will be asked to hold your breath as each sample is taken. Laparoscopic Liver Biopsy  You will be positioned on your back.  Several small incisions will be made in your abdomen.  Your doctor will pass a tiny camera through one incision. The camera will allow the liver to be viewed on a TV monitor in the operating room.  Tools will be passed through the other incision or incisions. These tools will be used to remove samples of liver tissue. Transjugular Liver Biopsy   You will be positioned on your back on an X-ray table, with your head turned to your  left.  An area on your neck just over your jugular vein will be numbed.  An incision will be made in the numbed area.  A tiny tube will be inserted through the incision. It will be pushed through the jugular vein to a blood vessel in the liver called the hepatic vein.  Dye will be inserted through the tube, and X-rays will be taken. The dye will make the blood vessels in the liver light up on the X-rays.  The biopsy needle will be pushed through the tube until it reaches the liver.  Samples of liver tissue will be taken with the biopsy needle.  The needle and the tube will be removed. After the samples are obtained, the incision or incisions will be closed. What happens after the procedure?  You will be taken to a recovery area.  You may have to lie on your right side for 1-2 hours. This will prevent bleeding from the biopsy site.  Your progress will be watched. Your blood pressure, pulse, and the biopsy site will be checked often.  You may have some pain or feel sick. If this happens, tell your health care provider.  As you begin to feel better, you will be offered ice and beverages.  You may be allowed to go home when the medicines have worn off and you can walk, drink, eat, and use the bathroom. This information is not intended to replace advice given to you by your health care provider. Make sure you discuss any questions you have with your health care provider. Document Released: 08/21/2003 Document Revised: 11/03/2015 Document Reviewed: 07/27/2013 Elsevier Interactive Patient Education  2017 [place]. Liver Biopsy, Care After Refer to this sheet in the next few weeks. These instructions provide you with information on caring for yourself after your procedure. Your health care provider may also give you more specific instructions. Your treatment has been planned according to current medical practices, but problems sometimes occur. Call your health care provider if you have  any problems or questions after your procedure. What can I expect after the procedure? After your procedure, it is typical to have the following:  A small amount of discomfort in the area where the biopsy was done and in the right shoulder or shoulder blade.  A small amount of bruising around the area where the biopsy was done and on the skin over the liver.  Sleepiness and fatigue for the rest of the day. Follow these instructions at home:  Rest at home for 1-2 days or as directed by your health care provider.  Have a friend or family member stay with you for at least 24 hours.  Because of the medicines used during the procedure, you should not do the following things in the first 24 hours:  Drive.  Use machinery.  Be responsible for the care of other people.  Sign legal documents.  Take a bath or shower.  There are many different ways to close and  cover an incision, including stitches, skin glue, and adhesive strips. Follow your health care provider's instructions on:  Incision care.  Bandage (dressing) changes and removal.  Incision closure removal.  Do not drink alcohol in the first week.  Do not lift more than 5 pounds or play contact sports for 2 weeks after this test.  Take medicines only as directed by your health care provider. Do not take medicine containing aspirin or non-steroidal anti-inflammatory medicines such as ibuprofen for 1 week after this test.  It is your responsibility to get your test results. Contact a health care provider if:  You have increased bleeding from an incision that results in more than a small spot of blood.  You have redness, swelling, or increasing pain in any incisions.  You notice a discharge or a bad smell coming from any of your incisions.  You have a fever or chills. Get help right away if:  You develop swelling, bloating, or pain in your abdomen.  You become dizzy or faint.  You develop a rash.  You are nauseous  or vomit.  You have difficulty breathing, feel short of breath, or feel faint.  You develop chest pain.  You have problems with your speech or vision.  You have trouble balancing or moving your arms or legs. This information is not intended to replace advice given to you by your health care provider. Make sure you discuss any questions you have with your health care provider. Document Released: 12/18/2004 Document Revised: 11/06/2015 Document Reviewed: 07/27/2013 Elsevier Interactive Patient Education  2017 [place]. Moderate Conscious Sedation, Adult, Care After These instructions provide you with information about caring for yourself after your procedure. Your health care provider may also give you more specific instructions. Your treatment has been planned according to current medical practices, but problems sometimes occur. Call your health care provider if you have any problems or questions after your procedure. What can I expect after the procedure? After your procedure, it is common:  To feel sleepy for several hours.  To feel clumsy and have poor balance for several hours.  To have poor judgment for several hours.  To vomit if you eat too soon. Follow these instructions at home: For at least 24 hours after the procedure:    Do not:  Participate in activities where you could fall or become injured.  Drive.  Use heavy machinery.  Drink alcohol.  Take sleeping pills or medicines that cause drowsiness.  Make important decisions or sign legal documents.  Take care of children on your own.  Rest. Eating and drinking   Follow the diet recommended by your health care provider.  If you vomit:  Drink water, juice, or soup when you can drink without vomiting.  Make sure you have little or no nausea before eating solid foods. General instructions   Have a responsible adult stay with you until you are awake and alert.  Take over-the-counter and prescription  medicines only as told by your health care provider.  If you smoke, do not smoke without supervision.  Keep all follow-up visits as told by your health care provider. This is important. Contact a health care provider if:  You keep feeling nauseous or you keep vomiting.  You feel light-headed.  You develop a rash.  You have a fever. Get help right away if:  You have trouble breathing. This information is not intended to replace advice given to you by your health care provider. Make sure you discuss any  questions you have with your health care provider. Document Released: 03/21/2013 Document Revised: 11/03/2015 Document Reviewed: 09/20/2015 Elsevier Interactive Patient Education  2017 [employer].

## 2016-08-13 NOTE — H&P (Signed)
Chief Complaint: Patient was seen in consultation today for random liver biopsy at the request of Gessner,Carl E  Referring Physician(s): Silvano Rusk E  Supervising Physician: Sandi Mariscal  Patient Status: [place]  History of Present Illness: Miranda Cochran is a 66 y.o. female being worked up for elevated liver enzymes and possible autoimmune hepatitis. She is referred for US guided random liver biopsy. PMHx, meds, labs, allergies reviewed. Has been NPO this am.  Past Medical History:  Diagnosis Date  . Allergy   . Anxiety   . Asthma   . Depression   . DM (diabetes mellitus) ([place])   . GERD (gastroesophageal reflux disease)   . Hepatic cirrhosis ([place])   . Hiatal hernia   . Hx of adenomatous polyp of colon 07/07/2009  . Hyperlipidemia   . Hypertension   . IBS (irritable bowel syndrome)   . Sinusitis, chronic   . Thyroid nodule   . Umbilical hernia   . Uterine fibroid   . Vascular disease   . Vitamin D deficiency     Past Surgical History:  Procedure Laterality Date  . CATARACT EXTRACTION Bilateral   . COLONOSCOPY  11/1991, 06/2009  . ESOPHAGOGASTRODUODENOSCOPY  02/2002  . EYE SURGERY    . NASAL SEPTOPLASTY W/ TURBINOPLASTY    . TONSILLECTOMY      Allergies: Fentanyl; Levofloxacin; Prednisone; Amoxicillin; Augmentin [amoxicillin-pot clavulanate]; Farxiga [dapagliflozin]; and Simvastatin  Medications: Prior to Admission medications   Medication Sig Start Date End Date Taking? Authorizing Provider  aspirin 325 MG tablet Take 325 mg by mouth daily as needed.    Yes Historical Provider, MD  azelastine (OPTIVAR) 0.05 % ophthalmic solution instill 1 drop into affected eye DAILY 04/01/16  Yes Robyn Haber, MD  blood glucose meter kit and supplies KIT Dispense based on patient and insurance preference. Use up to four times daily as directed. (FOR ICD-9 250.00, 250.01). 05/20/15  Yes Robyn Haber, MD  cyclobenzaprine (FLEXERIL) 10 MG tablet take 1 tablet by  mouth at bedtime if needed 04/01/16  Yes Robyn Haber, MD  EFFEXOR XR 75 MG 24 hr capsule take 1 capsule by mouth twice a day WITH A MEAL 07/30/16  Yes Gatha Mayer, MD  furosemide (LASIX) 20 MG tablet Take 1 tablet (20 mg total) by mouth daily as needed. 04/01/16  Yes Robyn Haber, MD  glipiZIDE (GLUCOTROL) 10 MG tablet Take 1 tablet (10 mg total) by mouth 2 (two) times daily after a meal. 06/04/16  Yes Dorothyann Peng, NP  glucose blood test strip Use as instructed 04/01/16  Yes Robyn Haber, MD  Lancets [facility] ULTRASOFT) lancets Use as instructed 04/01/16  Yes Robyn Haber, MD  lansoprazole (PREVACID) 30 MG capsule Take 1 capsule (30 mg total) by mouth daily. 04/01/16  Yes Robyn Haber, MD  metFORMIN (GLUCOPHAGE) 500 MG tablet Take 1 tablet (500 mg total) by mouth 2 (two) times daily with a meal. 04/01/16  Yes Robyn Haber, MD  oxyCODONE-acetaminophen (PERCOCET/ROXICET) 5-325 MG tablet Take 1 tablet by mouth every 8 (eight) hours as needed. 04/01/16  Yes Robyn Haber, MD  promethazine (PHENERGAN) 12.5 MG tablet Take 1 tablet (12.5 mg total) by mouth every 8 (eight) hours as needed for nausea or vomiting. 04/01/16  Yes Robyn Haber, MD  ALPRAZolam Duanne Moron) 0.5 MG tablet Take 1 tablet (0.5 mg total) by mouth at bedtime as needed for anxiety. 04/01/16   Robyn Haber, MD  Fluticasone-Salmeterol (ADVAIR DISKUS) 100-50 MCG/DOSE AEPB Inhale 1 puff into the lungs 2 (  two) times daily. 04/01/16   Robyn Haber, MD  montelukast (SINGULAIR) 10 MG tablet TAKE 1 TABLET BY MOUTH AT BEDTIME. 04/01/16   Robyn Haber, MD  triamterene-hydrochlorothiazide (MAXZIDE) 75-50 MG tablet Take 1 tablet by mouth daily. 04/01/16   Robyn Haber, MD     Family History  Problem Relation Age of Onset  . Stroke Mother   . Hypertension Mother   . Colon cancer Father   . Liver cancer Father   . Heart disease Sister   . Stroke Maternal Grandmother   . Hypertension Maternal Grandmother   .  Esophageal cancer Maternal Grandfather   . Breast cancer Paternal Grandmother   . Brain cancer Paternal Grandmother   . Lung cancer Paternal Grandfather     Social History   Social History  . Marital status: Divorced    Spouse name: N/A  . Number of children: 0  . Years of education: N/A   Occupational History  . Office management     [employer]   Social History Main Topics  . Smoking status: Never Smoker  . Smokeless tobacco: Never Used  . Alcohol use Yes  . Drug use: No  . Sexual activity: Not Asked   Other Topics Concern  . 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  All other systems are negative.  Review of Systems  Vital Signs: BP 135/69 (BP Location: Left Arm)   Pulse (!) 102   Temp 98.4 F (36.9 C) (Oral)   Resp 18   Ht '5\' 5"'$  (1.651 m)   Wt 141 lb 6 oz (64.1 kg)   SpO2 97%   BMI 23.53 kg/m   Physical Exam  Constitutional: She is oriented to person, place, and time. She appears well-developed and well-nourished. No distress.  HENT:  Head: Normocephalic.  Mouth/Throat: Oropharynx is clear and moist.  Neck: Normal range of motion. No JVD present. No tracheal deviation present.  Cardiovascular: Normal rate, regular rhythm and normal heart sounds.   Pulmonary/Chest: Effort normal and breath sounds normal. No respiratory distress.  Abdominal: Soft. She exhibits no distension and no mass. There is no tenderness.  Neurological: She is alert and oriented to person, place, and time.  Skin: Skin is warm and dry.  Psychiatric: She has a normal mood and affect. Judgment normal.    Mallampati Score:  MD Evaluation Airway: WNL Heart: WNL Abdomen: WNL Chest/ Lungs: WNL ASA  Classification: 2 Mallampati/Airway Score: One  Imaging: No results  found.  Labs:  CBC:  Recent Labs  05/24/16 1007 07/30/16 1430 08/13/16 1125  WBC 8.7 6.7 6.5  HGB 12.8 9.8* 8.9*  HCT 37.1 29.4* 27.9*  PLT 246.0 222.0 195    COAGS:  Recent Labs  07/30/16 1430  INR 1.3*    BMP:  Recent Labs  09/24/15 1752 11/01/15 0959 05/24/16 1007 06/04/16 1137 07/30/16 1430  NA 127* 131* 126* 132* 129*  K 3.4* 3.6 3.2* 3.8 3.6  CL 91* 93* 83* 98 97  CO2 24 30 33* 27 26  GLUCOSE 130* 152* 175* 169* 164*  BUN '11 11 17 9 10  '$ CALCIUM 9.0 8.5* 9.3 8.7 9.4  CREATININE 0.57 0.65 0.77 0.59 0.55  GFRNONAA >89 >89  --   --   --   GFRAA >89 >89  --   --   --  LIVER FUNCTION TESTS:  Recent Labs  09/24/15 1752 11/01/15 0959 05/24/16 1007 07/30/16 1430  BILITOT 0.9 0.7 1.3* 0.8  AST 110* 96* 140* 141*  ALT 123* 104* 129* 118*  ALKPHOS 112 92 133* 152*  PROT 9.3* 8.5* 9.6* 9.2*  ALBUMIN 3.5* 3.5* 3.5 3.4*    TUMOR MARKERS: No results for input(s): AFPTM, CEA, CA199, CHROMGRNA in the last 8760 hours.  Assessment and Plan: Elevated liver enzymes. For US guided random liver biopsy Labs ok Risks and Benefits discussed with the patient including, but not limited to bleeding, infection, damage to adjacent structures or low yield requiring additional tests. All of the patient's questions were answered, patient is agreeable to proceed. Consent signed and in chart.    Thank you for this interesting consult.  I greatly enjoyed meeting Miranda Cochran and look forward to participating in their care.  A copy of this report was sent to the requesting provider on this date.  Electronically Signed: Ascencion Dike 08/13/2016, 12:06 PM   I spent a total of 20 minutes in face to face in clinical consultation, greater than 50% of which was counseling/coordinating care for liver biopsy

## 2016-08-13 NOTE — Procedures (Signed)
Pre Procedure Dx: Cirrhosis Post Procedural Dx: Same  Technically successful US guided biopsy of the right lobe of the liver.  EBL: None  No immediate complications.   Ronny Bacon, MD Pager #: [phone]

## 2016-08-16 ENCOUNTER — Ambulatory Visit (HOSPITAL_COMMUNITY)
Admission: EM | Admit: 2016-08-16 | Discharge: 2016-08-16 | Disposition: A | Discharge status: Home / Self Care | Payer: 59 | Attending: Family Medicine | Admitting: Family Medicine

## 2016-08-16 ENCOUNTER — Encounter (HOSPITAL_COMMUNITY): Payer: Self-pay | Admitting: Family Medicine

## 2016-08-16 DIAGNOSIS — J0101 Acute recurrent maxillary sinusitis: Secondary | ICD-10-CM

## 2016-08-16 DIAGNOSIS — M545 Low back pain, unspecified: Secondary | ICD-10-CM

## 2016-08-16 DIAGNOSIS — Z0000 Encounter for general adult medical examination without abnormal findings: Secondary | ICD-10-CM

## 2016-08-16 DIAGNOSIS — R52 Pain, unspecified: Secondary | ICD-10-CM

## 2016-08-16 MED ORDER — OXYCODONE-ACETAMINOPHEN 5-325 MG PO TABS: 1.0000 | ORAL_TABLET | Freq: Three times a day (TID) | ORAL | 0 refills | Status: DC | PRN

## 2016-08-16 MED ORDER — AZITHROMYCIN 250 MG PO TABS: 250.0000 mg | ORAL_TABLET | Freq: Every day | ORAL | 1 refills | Status: DC

## 2016-08-16 NOTE — ED Provider Notes (Signed)
[place]    CSN: [id number] Arrival date & time: 08/16/16  1924     History   Chief Complaint No chief complaint on file.   HPI Miranda Cochran is a 66 y.o. female.   This is a 66 year old lady who presents with symptoms of recurrent sinusitis. She's had a week of bloody mucopurulent discharge chart from her nose with sinus pressure and dental pain.  Patient's also had chronic elevation of her liver functions, currently being worked up for autoimmune disease. She had a liver biopsy several days ago and results are pending. She cannot tolerate prednisone and there is some consideration for Treximet should the biopsy confirmed an autoimmune phenomenon.  She also has markedly fluctuating blood sugars and would like an endocrinology referral. She's failed numerous medications with side effects and is currently taking glipizide and metformin.      Past Medical History:  Diagnosis Date  . Allergy   . Anxiety   . Asthma   . Depression   . DM (diabetes mellitus) ([place])   . GERD (gastroesophageal reflux disease)   . Hepatic cirrhosis ([place])   . Hiatal hernia   . Hx of adenomatous polyp of colon 07/07/2009  . Hyperlipidemia   . Hypertension   . IBS (irritable bowel syndrome)   . Sinusitis, chronic   . Thyroid nodule   . Umbilical hernia   . Uterine fibroid   . Vascular disease   . Vitamin D deficiency     Patient Active Problem List   Diagnosis Date Noted  . Sinusitis, chronic   . Hypertension   . Hyperlipidemia   . Vitamin D deficiency   . Thyroid nodule 04/29/2011  . Hx of adenomatous polyp of colon 07/07/2009  . IRRITABLE BOWEL SYNDROME 10/26/2007  . TRANSAMINASES, SERUM, ELEVATED 10/26/2007  . Uncontrolled diabetes mellitus ([place]) 10/24/2007  . Anxiety state 10/24/2007  . GERD 10/24/2007    Past Surgical History:  Procedure Laterality Date  . CATARACT EXTRACTION Bilateral   . COLONOSCOPY  11/1991, 06/2009  . ESOPHAGOGASTRODUODENOSCOPY  02/2002  .  EYE SURGERY    . NASAL SEPTOPLASTY W/ TURBINOPLASTY    . TONSILLECTOMY      OB History    No data available       Home Medications    Prior to Admission medications   Medication Sig Start Date End Date Taking? Authorizing Provider  ALPRAZolam Duanne Moron) 0.5 MG tablet Take 1 tablet (0.5 mg total) by mouth at bedtime as needed for anxiety. 04/01/16   Robyn Haber, MD  aspirin 325 MG tablet Take 325 mg by mouth daily as needed.     Historical Provider, MD  azelastine (OPTIVAR) 0.05 % ophthalmic solution instill 1 drop into affected eye DAILY 04/01/16   Robyn Haber, MD  azithromycin (ZITHROMAX) 250 MG tablet Take 1 tablet (250 mg total) by mouth daily. Take first 2 tablets together, then 1 every day until finished. 08/16/16   Robyn Haber, MD  blood glucose meter kit and supplies KIT Dispense based on patient and insurance preference. Use up to four times daily as directed. (FOR ICD-9 250.00, 250.01). 05/20/15   Robyn Haber, MD  cyclobenzaprine (FLEXERIL) 10 MG tablet take 1 tablet by mouth at bedtime if needed 04/01/16   Robyn Haber, MD  [facility] XR 75 MG 24 hr capsule take 1 capsule by mouth twice a day WITH A MEAL 07/30/16   Gatha Mayer, MD  Fluticasone-Salmeterol (ADVAIR DISKUS) 100-50 MCG/DOSE AEPB Inhale 1 puff into  the lungs 2 (two) times daily. 04/01/16   Robyn Haber, MD  furosemide (LASIX) 20 MG tablet Take 1 tablet (20 mg total) by mouth daily as needed. 04/01/16   Robyn Haber, MD  glipiZIDE (GLUCOTROL) 10 MG tablet Take 1 tablet (10 mg total) by mouth 2 (two) times daily after a meal. 06/04/16   Dorothyann Peng, NP  glucose blood test strip Use as instructed 04/01/16   Robyn Haber, MD  Lancets [facility] ULTRASOFT) lancets Use as instructed 04/01/16   Robyn Haber, MD  lansoprazole (PREVACID) 30 MG capsule Take 1 capsule (30 mg total) by mouth daily. 04/01/16   Robyn Haber, MD  metFORMIN (GLUCOPHAGE) 500 MG tablet Take 1 tablet (500 mg total) by mouth 2  (two) times daily with a meal. 04/01/16   Robyn Haber, MD  montelukast (SINGULAIR) 10 MG tablet TAKE 1 TABLET BY MOUTH AT BEDTIME. 04/01/16   Robyn Haber, MD  oxyCODONE-acetaminophen (PERCOCET/ROXICET) 5-325 MG tablet Take 1 tablet by mouth every 8 (eight) hours as needed. 08/16/16   Robyn Haber, MD  promethazine (PHENERGAN) 12.5 MG tablet Take 1 tablet (12.5 mg total) by mouth every 8 (eight) hours as needed for nausea or vomiting. 04/01/16   Robyn Haber, MD  triamterene-hydrochlorothiazide (MAXZIDE) 75-50 MG tablet Take 1 tablet by mouth daily. 04/01/16   Robyn Haber, MD    Family History Family History  Problem Relation Age of Onset  . Stroke Mother   . Hypertension Mother   . Colon cancer Father   . Liver cancer Father   . Heart disease Sister   . Stroke Maternal Grandmother   . Hypertension Maternal Grandmother   . Esophageal cancer Maternal Grandfather   . Breast cancer Paternal Grandmother   . Brain cancer Paternal Grandmother   . Lung cancer Paternal Grandfather     Social History Social History  Substance Use Topics  . Smoking status: Never Smoker  . Smokeless tobacco: Never Used  . Alcohol use Yes     Allergies   Fentanyl; Levofloxacin; Prednisone; Amoxicillin; Augmentin [amoxicillin-pot clavulanate]; Farxiga [dapagliflozin]; and Simvastatin   Review of Systems Review of Systems   Physical Exam Triage Vital Signs ED Triage Vitals [08/16/16 1943]  Enc Vitals Group     BP 159/70     Pulse Rate 80     Resp 12     Temp 98.1 F (36.7 C)     Temp Source Oral     SpO2 100 %     Weight      Height      Head Circumference      Peak Flow      Pain Score      Pain Loc      Pain Edu?      Excl. in [place]?    No data found.   Updated Vital Signs BP 159/70 (BP Location: Left Arm)   Pulse 80   Temp 98.1 F (36.7 C) (Oral)   Resp 12   SpO2 100%    Physical Exam  Constitutional: She is oriented to person, place, and time. She appears  well-developed and well-nourished.  HENT:  Head: Normocephalic.  Right Ear: External ear normal.  Left Ear: External ear normal.  Mouth/Throat: Oropharynx is clear and moist.  Postnasal drainage present Marked mucopurulent discharge from the nose  Eyes: Conjunctivae and EOM are normal. Pupils are equal, round, and reactive to light.  Neck: Normal range of motion.  Cardiovascular: Normal rate, regular rhythm and normal heart sounds.  Pulmonary/Chest: Effort normal and breath sounds normal.  Abdominal: Soft.  Musculoskeletal: Normal range of motion.  Neurological: She is alert and oriented to person, place, and time.  Skin: Skin is warm and dry.  Healing right lateral upper quadrant liver biopsy site  Nursing note and vitals reviewed.    UC Treatments / Results  Labs (all labs ordered are listed, but only abnormal results are displayed) Results for orders placed or performed during the hospital encounter of 08/13/16  APTT upon arrival  Result Value Ref Range   aPTT 35 24 - 36 seconds  CBC upon arrival  Result Value Ref Range   WBC 6.5 4.0 - 10.5 K/uL   RBC 3.22 (L) 3.87 - 5.11 MIL/uL   Hemoglobin 8.9 (L) 12.0 - 15.0 g/dL   HCT 27.9 (L) 36.0 - 46.0 %   MCV 86.6 78.0 - 100.0 fL   MCH 27.6 26.0 - 34.0 pg   MCHC 31.9 30.0 - 36.0 g/dL   RDW 14.0 11.5 - 15.5 %   Platelets 195 150 - 400 K/uL  Protime-INR upon arrival  Result Value Ref Range   Prothrombin Time 14.8 11.4 - 15.2 seconds   INR 1.15     EKG  EKG Interpretation None       Radiology No results found.  Procedures Procedures (including critical care time)  Medications Ordered in UC Medications - No data to display   Initial Impression / Assessment and Plan / UC Course  I have reviewed the triage vital signs and the nursing notes.  Pertinent labs & imaging results that were available during my care of the patient were reviewed by me and considered in my medical decision making (see chart for  details).    Final Clinical Impressions(s) / UC Diagnoses   Final diagnoses:  Acute recurrent maxillary sinusitis    New Prescriptions New Prescriptions   AZITHROMYCIN (ZITHROMAX) 250 MG TABLET    Take 1 tablet (250 mg total) by mouth daily. Take first 2 tablets together, then 1 every day until finished.     Robyn Haber, MD 08/16/16 2023

## 2016-08-18 ENCOUNTER — Other Ambulatory Visit: Payer: Self-pay

## 2016-08-18 ENCOUNTER — Encounter: Payer: Self-pay | Admitting: Internal Medicine

## 2016-08-18 ENCOUNTER — Telehealth: Payer: Self-pay | Admitting: Internal Medicine

## 2016-08-18 DIAGNOSIS — K754 Autoimmune hepatitis: Secondary | ICD-10-CM

## 2016-08-18 HISTORY — DX: Autoimmune hepatitis: K75.4

## 2016-08-18 MED ORDER — BUDESONIDE 3 MG PO CPEP: 9.0000 mg | ORAL_CAPSULE | Freq: Every day | ORAL | 2 refills | Status: DC

## 2016-08-18 NOTE — Progress Notes (Signed)
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 Should be treatable and can prevent cirrhosis though cannot guarantee. Lucky she was sent for evaluation.  Prednisone is commonly used to treat this but so has budesonide - both will make blood sugars go up but budesonide probably less. We transition to azathioprine when we can and get off the corticosteroids but need to use both in beginning. Need some other blood tests before we start azathioprine.  1) Start Entocort EC 9 mg qd # 90 3 mg and 2 RF - IF TOO EXPENSIVE LET us KNOW 2) Thiopurine methyltransferase testing please to see how she metabolizes azathioprine 3) CMET in 1 week please 4) OV in 6-8 weeks 5) will call her by next week with more info 6) watch sugars may need different tx while on steroids - am ccing her PCP

## 2016-08-18 NOTE — Telephone Encounter (Signed)
See biopsy results notes for details.

## 2016-08-19 ENCOUNTER — Other Ambulatory Visit: Payer: Self-pay

## 2016-08-19 ENCOUNTER — Telehealth: Payer: Self-pay | Admitting: Family Medicine

## 2016-08-19 DIAGNOSIS — E1165 Type 2 diabetes mellitus with hyperglycemia: Principal | ICD-10-CM

## 2016-08-19 DIAGNOSIS — IMO0001 Reserved for inherently not codable concepts without codable children: Secondary | ICD-10-CM

## 2016-08-19 NOTE — Telephone Encounter (Signed)
Pt would like to have a referral to Dr. Dwyane Dee the endocrinologist [phone] (F)

## 2016-08-19 NOTE — Telephone Encounter (Signed)
Referral was placed for Endocrinology as requested with Dr Dwyane Dee.

## 2016-08-19 NOTE — Telephone Encounter (Signed)
Patient is aware. Waiting for the call to schedule appointment with Dr Dwyane Dee.

## 2016-08-25 ENCOUNTER — Other Ambulatory Visit: Payer: Self-pay | Admitting: Internal Medicine

## 2016-08-25 DIAGNOSIS — D509 Iron deficiency anemia, unspecified: Secondary | ICD-10-CM

## 2016-08-25 DIAGNOSIS — D500 Iron deficiency anemia secondary to blood loss (chronic): Secondary | ICD-10-CM | POA: Insufficient documentation

## 2016-08-25 DIAGNOSIS — K754 Autoimmune hepatitis: Secondary | ICD-10-CM

## 2016-08-25 HISTORY — DX: Iron deficiency anemia secondary to blood loss (chronic): D50.0

## 2016-08-25 MED ORDER — FERROUS SULFATE 325 (65 FE) MG PO TABS: 325.0000 mg | ORAL_TABLET | Freq: Every day | ORAL | 3 refills | Status: DC

## 2016-08-25 NOTE — Progress Notes (Signed)
Spoke to patient - feels ok 1) Has a new Fe defic anemia no bleeding ? Nutritional - to start ferrous sulfate 2) will also check a B12 and HAV and HBV immunity with labs - she will do them next week as did not start budesonide til weekend to be sure she tolerates - ordered

## 2016-08-30 ENCOUNTER — Other Ambulatory Visit (INDEPENDENT_AMBULATORY_CARE_PROVIDER_SITE_OTHER): Payer: 59

## 2016-08-30 DIAGNOSIS — D509 Iron deficiency anemia, unspecified: Secondary | ICD-10-CM

## 2016-08-30 DIAGNOSIS — K754 Autoimmune hepatitis: Secondary | ICD-10-CM

## 2016-08-30 LAB — COMPREHENSIVE METABOLIC PANEL
ALK PHOS: 141 U/L — AB (ref 39–117)
ALT: 92 U/L — AB (ref 0–35)
AST: 76 U/L — AB (ref 0–37)
Albumin: 3.4 g/dL — ABNORMAL LOW (ref 3.5–5.2)
BUN: 14 mg/dL (ref 6–23)
CO2: 26 meq/L (ref 19–32)
CREATININE: 0.6 mg/dL (ref 0.40–1.20)
Calcium: 9.1 mg/dL (ref 8.4–10.5)
Chloride: 94 mEq/L — ABNORMAL LOW (ref 96–112)
GFR: 106.85 mL/min (ref >60.00)
GLUCOSE: 356 mg/dL — AB (ref 70–99)
Potassium: 3.5 mEq/L (ref 3.5–5.1)
SODIUM: 127 meq/L — AB (ref 135–145)
Total Bilirubin: 0.5 mg/dL (ref 0.2–1.2)
Total Protein: 8.8 g/dL — ABNORMAL HIGH (ref 6.0–8.3)

## 2016-08-30 LAB — HEPATITIS A ANTIBODY, TOTAL: HEP A TOTAL AB: NONREACTIVE

## 2016-08-30 LAB — VITAMIN B12: VITAMIN B 12: 452 pg/mL (ref 211–911)

## 2016-08-30 LAB — HEPATITIS B SURFACE ANTIBODY,QUALITATIVE: Hep B S Ab: POSITIVE — AB

## 2016-08-31 NOTE — Progress Notes (Signed)
Liver tests improving awaiting other test results

## 2016-09-03 LAB — THIOPURINE METHYLTRANSFERASE (TPMT), RBC: THIOPURINE METHYLTRANSFERASE, RBC: 22 nmol/h/mL

## 2016-09-06 ENCOUNTER — Other Ambulatory Visit: Payer: Self-pay | Admitting: Internal Medicine

## 2016-09-06 DIAGNOSIS — K754 Autoimmune hepatitis: Secondary | ICD-10-CM

## 2016-09-06 NOTE — Progress Notes (Signed)
Normal TPMT so will start azathioprine at 50 mg daily Rxed My Chart message Will recheck labs in 2 weeks - ordered

## 2016-09-13 NOTE — Progress Notes (Signed)
[name], female   DOB: 07-13-1950, 66 y.o.   MRN: [id number]            Reason for Appointment: Consultation for Type 2 Diabetes  Referring physician: Kordsmeier   History of Present Illness:          Date of diagnosis of type 2 diabetes mellitus: ?  2013        Background history:   She had a blood sugar over 200 in 2013 but was not told to have diabetes until 04/2015 A1c was 7.3 in 3/16 In 04/2015 blood sugar was 478 says her blood sugar was 600 when she was diagnosed and was not having any significant symptoms Initially treated with Metaglip but this apparently caused abdominal discomfort and she was switched to metformin In 2017 she had been tried on Bydureon for better control but she stopped this after a few weeks because of large persisted nodules in her skin She thinks her blood sugars were better with this and A1c was down to 6.8  Recent history:   Non-insulin hypoglycemic drugs the patient is taking are: Glipizide '5mg'$  bid, metformin 500 mg twice a day  Current management, blood sugar patterns and problems identified:  Her blood sugars were much higher with A1c 8.3 in December 2017 and she was told to start glipizide 10 mg twice a day in addition to her metformin  Because of her having abdominal discomfort with the glipizide 10 mg she is only taking 5 mg twice a day from an old prescription  She is checking her blood sugars somewhat sporadically and mostly in the mornings and these are variable, mostly ranging from 130-150.  Unable to download her monitor today.  She however has some blood sugars over 300 and as high as 440 which are mostly when she is binging on sweets such as honey buns and regular soft drinks  She says she is not consistently watching her diet even though she thinks she knows how to modify her diet for diabetes, usually eating fast food at lunch.  She says she is too busy to exercise.  Her A1c was 8.3% in December before adding  glipizide to metformin, this was used because she reported swelling of her legs with Tradjenta        Side effects from medications have been: Metaglip caused abdominal pain, Tradjenta caused swelling of the legs, Bydureon caused skin nodules, Farxiga caused diarrhea  Compliance with the medical regimen:  Hypoglycemia:    Glucose monitoring:  done  times a day         Glucometer: One Touch.      Blood Glucose readings by time of day as above, meter could not be downloaded  Self-care:  Typical meal intake: Breakfast is Oatmeal, usually has fast food such as [employer], [employer] and [employer] for lunch, usually eating low-fat dinner and her snacks will be fruit, peanut butter crackers, periodically sweets and drinks with sugar                Dietician visit, most recent: Never               Exercise:  none  Weight history:  Wt Readings from Last 3 Encounters:  09/14/16 141 lb (64 kg)  08/13/16 141 lb 6 oz (64.1 kg)  07/30/16 141 lb 6 oz (64.1 kg)    Glycemic control:   Lab Results  Component Value Date   HGBA1C 8.3 (H) 05/24/2016   HGBA1C  6.8 11/01/2015   HGBA1C 7.8 09/24/2015   Lab Results  Component Value Date   MICROALBUR 10.5 (H) 05/24/2016   LDLCALC 145 (H) 05/24/2016   CREATININE 0.60 08/30/2016   Lab Results  Component Value Date   MICRALBCREAT 7.8 05/24/2016    No results found for: FRUCTOSAMINE    Allergies as of 09/14/2016      Reactions   Fentanyl Anxiety   Made her feel very anxious, requests not to get it.   Levofloxacin    hallucinations   Prednisone    Anxiety and paranoia   Amoxicillin Other (See Comments)   diarrhea   Augmentin [amoxicillin-pot Clavulanate] Other (See Comments)   diarrhea   Farxiga [dapagliflozin] Diarrhea   Simvastatin    myalgia      Medication List       Accurate as of 09/14/16 12:27 PM. Always use your most recent med list.          ALPRAZolam 0.5 MG tablet Commonly known as:  XANAX Take 1 tablet (0.5 mg total) by  mouth at bedtime as needed for anxiety.   aspirin 325 MG tablet Take 325 mg by mouth daily as needed.   azelastine 0.05 % ophthalmic solution Commonly known as:  OPTIVAR instill 1 drop into affected eye DAILY   blood glucose meter kit and supplies Kit Dispense based on patient and insurance preference. Use up to four times daily as directed. (FOR ICD-9 250.00, 250.01).   budesonide 3 MG 24 hr capsule Commonly known as:  ENTOCORT EC Take 3 capsules (9 mg total) by mouth daily.   cyclobenzaprine 10 MG tablet Commonly known as:  FLEXERIL take 1 tablet by mouth at bedtime if needed   Dulaglutide 0.75 MG/0.5ML Sopn Commonly known as:  TRULICITY Inject in the abdominal skin as directed once a week   EFFEXOR XR 75 MG 24 hr capsule Generic drug:  venlafaxine XR take 1 capsule by mouth twice a day WITH A MEAL   ferrous sulfate 325 (65 FE) MG tablet Take 1 tablet (325 mg total) by mouth daily with breakfast.   Fluticasone-Salmeterol 100-50 MCG/DOSE Aepb Commonly known as:  ADVAIR DISKUS Inhale 1 puff into the lungs 2 (two) times daily.   furosemide 20 MG tablet Commonly known as:  LASIX Take 1 tablet (20 mg total) by mouth daily as needed.   glipiZIDE 10 MG tablet Commonly known as:  GLUCOTROL Take 1 tablet (10 mg total) by mouth 2 (two) times daily after a meal.   glucose blood test strip Use as instructed   lansoprazole 30 MG capsule Commonly known as:  PREVACID Take 1 capsule (30 mg total) by mouth daily.   metFORMIN 500 MG tablet Commonly known as:  GLUCOPHAGE Take 1 tablet (500 mg total) by mouth 2 (two) times daily with a meal.   montelukast 10 MG tablet Commonly known as:  SINGULAIR TAKE 1 TABLET BY MOUTH AT BEDTIME.   onetouch ultrasoft lancets Use as instructed   oxyCODONE-acetaminophen 5-325 MG tablet Commonly known as:  PERCOCET/ROXICET Take 1 tablet by mouth every 8 (eight) hours as needed.   promethazine 12.5 MG tablet Commonly known as:   PHENERGAN Take 1 tablet (12.5 mg total) by mouth every 8 (eight) hours as needed for nausea or vomiting.       Allergies:  Allergies  Allergen Reactions  . Fentanyl Anxiety    Made her feel very anxious, requests not to get it.  . Levofloxacin     hallucinations  .  Prednisone     Anxiety and paranoia  . Amoxicillin Other (See Comments)    diarrhea  . Augmentin [Amoxicillin-Pot Clavulanate] Other (See Comments)    diarrhea  . Farxiga [Dapagliflozin] Diarrhea  . Simvastatin     myalgia    Past Medical History:  Diagnosis Date  . Allergy   . Anxiety   . Asthma   . 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 Depression   . DM (diabetes mellitus) ([place])   . GERD (gastroesophageal reflux disease)   . Hepatic cirrhosis ([place])   . Hiatal hernia   . Hx of adenomatous polyp of colon 07/07/2009  . Hyperlipidemia   . Hypertension   . IBS (irritable bowel syndrome)   . Sinusitis, chronic   . Thyroid nodule   . Umbilical hernia   . Uterine fibroid   . Vascular disease   . Vitamin D deficiency     Past Surgical History:  Procedure Laterality Date  . CATARACT EXTRACTION Bilateral   . COLONOSCOPY  11/1991, 06/2009  . ESOPHAGOGASTRODUODENOSCOPY  02/2002  . EYE SURGERY    . NASAL SEPTOPLASTY W/ TURBINOPLASTY    . TONSILLECTOMY      Family History  Problem Relation Age of Onset  . Stroke Mother   . Hypertension Mother   . Colon cancer Father   . Liver cancer Father   . Heart disease Sister     hole in heart  . Stroke Maternal Grandmother   . Hypertension Maternal Grandmother   . Esophageal cancer Maternal Grandfather   . Breast cancer Paternal Grandmother   . Brain cancer Paternal Grandmother   . Lung cancer Paternal Grandfather   . Diabetes Neg Hx     Social History:  reports that she has never smoked. She has never used smokeless tobacco. She reports that she drinks alcohol. She reports that she does not use drugs.   Review of Systems   Constitutional: Negative for weight loss.  HENT: Positive for nasal congestion.   Eyes: Negative for blurred vision.  Respiratory: Negative for shortness of breath.   Cardiovascular: Negative for chest pain and leg swelling.  Gastrointestinal: Negative for abdominal pain.  Endocrine: Negative for fatigue.       Night sweating episodes at times for the last month or so  Genitourinary: Negative for nocturia.  Musculoskeletal: Positive for joint pain and back pain.  Skin: Negative for rash.  Allergic/Immunologic: Positive for rhinorrhea and sneezing.       She has recurrent problems with allergic rhinitis  Neurological: Positive for numbness.       She has numbness in her feet for a few years, she thinks this is from pinched nerves in her feet and also because of history of ankle fracture  Psychiatric/Behavioral: Negative for insomnia.       She apparently has taken Effexor for several years, and initially given for irritable bowel syndrome, but this has helped her moods also     Lipid history: Previously had tried Zocor which apparently caused muscle aches, she took Crestor for some time but she stopped this on her cholesterol reportedly came back to normal and has not restarted Her PCP has told her to just watch her diet    Lab Results  Component Value Date   CHOL 209 (H) 05/24/2016   HDL 37.60 (L) 05/24/2016   LDLCALC 145 (H) 05/24/2016   TRIG 134.0 05/24/2016   CHOLHDL 6 05/24/2016  Hypertension: None, she says she is taking Maxzide because of tendency to swelling in her abdomen  Most recent eye exam was 7/17  Most recent foot exam:09/2016    LABS:  No visits with results within 1 Week(s) from this visit.  Latest known visit with results is:  Lab on 08/30/2016  Component Date Value Ref Range Status  . Thiopurine Methltransferase, RBC 08/30/2016 22  nmol/hr/mL RBC Final   Comment:   Reference Range for TPMT Activity:      >12       Normal   4-12        Heterozygote or low metabolizer     <4       Homozygote Deficient Range   This test was developed and its analytical performance characteristics have been determined by [facility]. It has not been cleared or approved by FDA. This assay has been validated pursuant to the CLIA regulations and is used for clinical purposes.   . Sodium 08/30/2016 127* 135 - 145 mEq/L Final  . Potassium 08/30/2016 3.5  3.5 - 5.1 mEq/L Final  . Chloride 08/30/2016 94* 96 - 112 mEq/L Final  . CO2 08/30/2016 26  19 - 32 mEq/L Final  . Glucose, Bld 08/30/2016 356* 70 - 99 mg/dL Final  . BUN 08/30/2016 14  6 - 23 mg/dL Final  . Creatinine, Ser 08/30/2016 0.60  0.40 - 1.20 mg/dL Final  . Total Bilirubin 08/30/2016 0.5  0.2 - 1.2 mg/dL Final  . Alkaline Phosphatase 08/30/2016 141* 39 - 117 U/L Final  . AST 08/30/2016 76* 0 - 37 U/L Final  . ALT 08/30/2016 92* 0 - 35 U/L Final  . Total Protein 08/30/2016 8.8* 6.0 - 8.3 g/dL Final  . Albumin 08/30/2016 3.4* 3.5 - 5.2 g/dL Final  . Calcium 08/30/2016 9.1  8.4 - 10.5 mg/dL Final  . GFR 08/30/2016 106.85  >60.00 mL/min Final  . Vitamin B-12 08/30/2016 452  211 - 911 pg/mL Final  . Hep A Total Ab 08/30/2016 NON REACTIVE  NON REACTIVE Final  . Hep B S Ab 08/30/2016 POS* NEGATIVE Final    Physical Examination:  BP 128/70   Pulse 91   Ht '5\' 5"'$  (1.651 m)   Wt 141 lb (64 kg)   BMI 23.46 kg/m   GENERAL:      She is averagely built and nourished HEENT:         Eye exam shows normal external appearance. Fundus exam shows no retinopathy.  Oral exam shows normal mucosa .  NECK:   There is no lymphadenopathy Thyroid is not enlarged and no nodules felt.  Carotids are normal to palpation and no bruit heard LUNGS:         Chest is symmetrical. Lungs are clear to auscultation.Marland Kitchen   HEART:         Heart sounds:  S1 and S2 are normal. No murmur or click heard., no S3 or S4.   ABDOMEN:   There is no distention present. Liver  appears enlarged, not able to palpate the edge clearly, spleen are not palpable.  No other mass or tenderness present.    NEUROLOGICAL:    She has mild muscle atrophy in her hands and feet.  Ankle jerks are normal bilaterally, biceps reflexes normal .    Diabetic Foot Exam - Simple   Simple Foot Form Diabetic Foot exam was performed with the following findings:  Yes   Visual Inspection See comments:  Yes  Sensation Testing See comments:  Yes Pulse Check Posterior Tibialis and Dorsalis pulse intact bilaterally:  Yes Comments Muscles appear somewhat atrophic. Decreased monofilament sensation on the distal toes superiorly but normal on the plantar surfaces            Vibration sense is Markedly  reduced in distal right first toes and moderately in the left. MUSCULOSKELETAL:  There is no swelling or deformity of the peripheral joints. Spine is normal to inspection.   EXTREMITIES:     There is no edema.  Marland Kitchen SKIN:  She has spider veins and purplish discoloration of her feet in patchy areas  No other skin lesions present.       ASSESSMENT:  Diabetes type 2, uncontrolled with normal BMI of 23     Currently she is on glipizide and  low-dose metformin Although her A1c is 7% she has some readings over 400 when she is going off her diet  Her blood sugars have been inconsistent and at times show significant variability based on her diet. She is not checking blood sugars enough after meals Also unable to download her One Touch ultra mini monitor today which has incorrect time programmed She has an adequate compliance with diet and exercise Currently is not knowledgeable about meal planning, frequently eating fast food lunches and periodically binging on sweets and drinks with sugar She has not apparently found time for exercise with her schedule   Complications of diabetes: Probable neuropathy: She has some decrease in monofilament sensation, decreased vibration sense and probable mild  muscle atrophy  Mixed hyperlipidemia, currently untreated  ?  History of hypertension: She is only on diuretics  HYPONATREMIA: Likely to be from taking the high dose of HCTZ   PLAN:    Since she had previously done better with a GLP-1 drug like Bydureon and may do better with her dietary compliance with trying this again she will be given Trulicity. Discussed with the patient the nature of GLP-1 drugs, the action on various organ systems, how they benefit blood glucose control, as well as the benefit of weight loss and  increase satiety . Explained possible side effects, particularly nausea and vomiting that usually resolve over time; discussed safety information in package insert. Demonstrated the medication injection device and injection technique to the patient.  Showed patient where to inject the medication. To start with 0.75 mg dosage weekly for the first 4 weeks Patient brochure on Trulicity with enclosed co-pay card given  After one week she will start cutting back on her glipizide to half tablet twice a day  Also discussed that if she has tendency to hypoglycemia she can stop this completely  She will continue 500 mg metformin twice a day for now but consider increasing the dose when her liver functions are back to normal  She will see the dietitian for meal planning  She does need to start walking for exercise at least every other day, and encouraged her to take breaks during the day to do this  Follow-up in 1 month to review her progress  Will need repeat serum sodium next visit and consider changing her Maxzide to another diuretic  For her hyperlipidemia would consider starting a statin drugs once her liver functions are nearly normal, discussed benefits of statin drugs in patients with diabetes and  reassured her that they do not cause cardiac side effects She has taking Crestor before without difficulties and may try this again    Patient Instructions  Check blood  sugars on waking up 3x weekly    Also check blood sugars about 2 hours after a meal and do this after different meals by rotation  Recommended blood sugar levels on waking up is 90-130 and about 2 hours after meal is 130-160  Please bring your blood sugar monitor to each visit, thank you   Walk daily  Try 1/2 glipizide 1 week after Trulicity  Start TRULICITYwith the pen as shown once weekly on the same day of the week.  You may inject in the stomach, thigh or arm as indicated in the brochure given.  You will feel fullness of the stomach with starting the medication and should try to keep the portions at meals small.   You may experience nausea in the first few days which usually gets better over time   If any questions or concerns are present call the office or the  [place] at [phone]. Also visit [email or web address] website for more useful information         Counseling time on subjects discussed above is over 50% of today's 60 minute visit   Consultation note has been sent to the referring physician  [facility] 09/14/2016, 12:27 PM   Note: This office note was prepared with Dragon voice recognition system technology. Any transcriptional errors that result from this process are unintentional.

## 2016-09-14 ENCOUNTER — Encounter: Payer: Self-pay | Admitting: Endocrinology

## 2016-09-14 ENCOUNTER — Ambulatory Visit (INDEPENDENT_AMBULATORY_CARE_PROVIDER_SITE_OTHER): Payer: 59 | Admitting: Endocrinology

## 2016-09-14 VITALS — BP 128/70 | HR 91 | Ht 65.0 in | Wt 141.0 lb

## 2016-09-14 DIAGNOSIS — E871 Hypo-osmolality and hyponatremia: Secondary | ICD-10-CM

## 2016-09-14 DIAGNOSIS — E782 Mixed hyperlipidemia: Secondary | ICD-10-CM

## 2016-09-14 DIAGNOSIS — E1165 Type 2 diabetes mellitus with hyperglycemia: Secondary | ICD-10-CM

## 2016-09-14 LAB — POCT GLYCOSYLATED HEMOGLOBIN (HGB A1C): HEMOGLOBIN A1C: 7

## 2016-09-14 MED ORDER — DULAGLUTIDE 0.75 MG/0.5ML ~~LOC~~ SOAJ: SUBCUTANEOUS | 0 refills | Status: DC

## 2016-09-14 NOTE — Patient Instructions (Addendum)
Check blood sugars on waking up 3x weekly    Also check blood sugars about 2 hours after a meal and do this after different meals by rotation  Recommended blood sugar levels on waking up is 90-130 and about 2 hours after meal is 130-160  Please bring your blood sugar monitor to each visit, thank you   Walk daily  Try 1/2 glipizide 1 week after Trulicity  Start TRULICITYwith the pen as shown once weekly on the same day of the week.  You may inject in the stomach, thigh or arm as indicated in the brochure given.  You will feel fullness of the stomach with starting the medication and should try to keep the portions at meals small.   You may experience nausea in the first few days which usually gets better over time   If any questions or concerns are present call the office or the  [place] at [phone]. Also visit [email or web address] website for more useful information

## 2016-09-20 ENCOUNTER — Other Ambulatory Visit (INDEPENDENT_AMBULATORY_CARE_PROVIDER_SITE_OTHER): Payer: 59

## 2016-09-20 DIAGNOSIS — K754 Autoimmune hepatitis: Secondary | ICD-10-CM

## 2016-09-20 LAB — CBC
HEMATOCRIT: 27.3 % — AB (ref 36.0–46.0)
MCHC: 32.4 g/dL (ref 30.0–36.0)
MCV: 81 fl (ref 78.0–100.0)
Platelets: 188 10*3/uL (ref 150.0–400.0)
RBC: 3.37 Mil/uL — ABNORMAL LOW (ref 3.87–5.11)
RDW: 15.8 % — AB (ref 11.5–15.5)
WBC: 7.2 10*3/uL (ref 4.0–10.5)

## 2016-09-20 LAB — COMPREHENSIVE METABOLIC PANEL
ALT: 43 U/L — ABNORMAL HIGH (ref 0–35)
AST: 43 U/L — ABNORMAL HIGH (ref 0–37)
Albumin: 3.3 g/dL — ABNORMAL LOW (ref 3.5–5.2)
Alkaline Phosphatase: 102 U/L (ref 39–117)
BUN: 14 mg/dL (ref 6–23)
CO2: 28 meq/L (ref 19–32)
Calcium: 8.8 mg/dL (ref 8.4–10.5)
Chloride: 96 mEq/L (ref 96–112)
Creatinine, Ser: 0.65 mg/dL (ref 0.40–1.20)
GFR: 97.41 mL/min (ref >60.00)
GLUCOSE: 416 mg/dL — AB (ref 70–99)
POTASSIUM: 3.3 meq/L — AB (ref 3.5–5.1)
SODIUM: 131 meq/L — AB (ref 135–145)
Total Bilirubin: 0.7 mg/dL (ref 0.2–1.2)
Total Protein: 7.7 g/dL (ref 6.0–8.3)

## 2016-09-21 ENCOUNTER — Other Ambulatory Visit: Payer: Self-pay

## 2016-09-21 ENCOUNTER — Encounter: Payer: Self-pay | Admitting: Internal Medicine

## 2016-09-21 DIAGNOSIS — R945 Abnormal results of liver function studies: Principal | ICD-10-CM

## 2016-09-21 DIAGNOSIS — R7989 Other specified abnormal findings of blood chemistry: Secondary | ICD-10-CM

## 2016-09-21 MED ORDER — POTASSIUM CHLORIDE ER 10 MEQ PO CPCR: 10.0000 meq | ORAL_CAPSULE | Freq: Two times a day (BID) | ORAL | 3 refills | Status: DC

## 2016-09-21 MED ORDER — AZATHIOPRINE 50 MG PO TABS: 50.0000 mg | ORAL_TABLET | Freq: Every day | ORAL | 3 refills | Status: DC

## 2016-09-21 NOTE — Progress Notes (Signed)
OK - new plans - I spoke to her  1) She wants to see how she does with blood sugar - as she saw Dr. Dwyane Dee 4/3 - just started Trulicity [name] I was still concerned that budesonide was having an affect on blood sugars but now that I understand better she just started new meds and A1C was 7 - ok with holding off as long as she monitors blood sugar  2) K Tx - Rx micro-K 10 mEQ take 2 each day # 60 3 rf  3) Will hold off on Azathioprine Tx  4) She will repeat labs as planned 4/18 and see me 4/19

## 2016-09-21 NOTE — Progress Notes (Signed)
Liver tests better but blood sugar very high There is no way around this as we need steroids to treat the liver K is also low  She is established w/ Dr. Dwyane Dee of endocrinology  and I will cc him  I have cced her PCP and Dr. Dwyane Dee and also will message them also - I hope she can be seen by one of them this week please let me know if that is not possible and I will call  1)  increase ferrous sulfate to bid and if she is having tolerance issues let me know and we will set up injectionof feraheme 2) Please Rx KCL 20 mEQ daily 3) Let's start azathioprine 50 mg qd - ultimately this will help [place] get her off steroids 4) Any ? Let me know

## 2016-09-21 NOTE — Progress Notes (Signed)
Do labs 4/18 - has appt 4/19

## 2016-09-21 NOTE — Telephone Encounter (Signed)
This encounter was created in error - please disregard.

## 2016-09-21 NOTE — Progress Notes (Signed)
K is 20 meq qd and # 90 3 RF

## 2016-09-21 NOTE — Progress Notes (Signed)
She will also need CMET and CBC in 2 weeks

## 2016-09-21 NOTE — Progress Notes (Signed)
Micro k Dr. Carlean Purl see warnings on Potassium and Maxzide.  "Very high" warning for the risk of hyperkalemia with concomitant use.  Please advise if ok to continue to prescribe the Potassium.     Per Dr. Carlean Purl ok to prescribe

## 2016-09-22 ENCOUNTER — Telehealth: Payer: Self-pay | Admitting: Endocrinology

## 2016-09-22 NOTE — Telephone Encounter (Signed)
Pt told Dr. Carlean Purl the reason it was high, she was stressed, ate sweets, had some steriods in her system  She is not coming in before the already scheduled time

## 2016-09-22 NOTE — Telephone Encounter (Signed)
-----   Message from Elayne Snare, MD sent at 09/21/2016  8:46 PM EDT ----- Regarding: FW: DM out of control help please Hi Stephanie   Please schedule her this week for follow-up of hyperglycemia, thanks  ----- Message ----- From: Gatha Mayer, MD Sent: 09/21/2016   8:33 AM To: Elayne Snare, MD, Delano Metz, FNP Subject: DM out of control help please                  [facility] Getting and Ajay,  This nice lady started Tx for autoimmune hepatitis - I used budesonide to reduce risk of hyperglycemia but BS > 400 now. Suspect she needs insulin.  I hope one of you can see her this week to get Tx started.  Thanks  Glendell Docker

## 2016-09-29 ENCOUNTER — Other Ambulatory Visit (INDEPENDENT_AMBULATORY_CARE_PROVIDER_SITE_OTHER): Payer: 59

## 2016-09-29 DIAGNOSIS — R945 Abnormal results of liver function studies: Principal | ICD-10-CM

## 2016-09-29 DIAGNOSIS — R7989 Other specified abnormal findings of blood chemistry: Secondary | ICD-10-CM

## 2016-09-29 LAB — CBC WITH DIFFERENTIAL/PLATELET
BASOS ABS: 0 10*3/uL (ref 0.0–0.1)
Basophils Relative: 0.5 % (ref 0.0–3.0)
EOS ABS: 0.1 10*3/uL (ref 0.0–0.7)
Eosinophils Relative: 1.5 % (ref 0.0–5.0)
HCT: 31.2 % — ABNORMAL LOW (ref 36.0–46.0)
Hemoglobin: 10 g/dL — ABNORMAL LOW (ref 12.0–15.0)
LYMPHS ABS: 3.5 10*3/uL (ref 0.7–4.0)
Lymphocytes Relative: 36.6 % (ref 12.0–46.0)
MCHC: 31.9 g/dL (ref 30.0–36.0)
MCV: 82.1 fl (ref 78.0–100.0)
Monocytes Absolute: 0.8 10*3/uL (ref 0.1–1.0)
Monocytes Relative: 8.3 % (ref 3.0–12.0)
NEUTROS ABS: 5 10*3/uL (ref 1.4–7.7)
NEUTROS PCT: 53.1 % (ref 43.0–77.0)
PLATELETS: 189 10*3/uL (ref 150.0–400.0)
RBC: 3.81 Mil/uL — ABNORMAL LOW (ref 3.87–5.11)
RDW: 17.8 % — ABNORMAL HIGH (ref 11.5–15.5)
WBC: 9.5 10*3/uL (ref 4.0–10.5)

## 2016-09-29 LAB — COMPREHENSIVE METABOLIC PANEL
ALT: 39 U/L — AB (ref 0–35)
AST: 42 U/L — AB (ref 0–37)
Albumin: 3.5 g/dL (ref 3.5–5.2)
Alkaline Phosphatase: 96 U/L (ref 39–117)
BUN: 14 mg/dL (ref 6–23)
CHLORIDE: 102 meq/L (ref 96–112)
CO2: 27 meq/L (ref 19–32)
CREATININE: 0.61 mg/dL (ref 0.40–1.20)
Calcium: 9.1 mg/dL (ref 8.4–10.5)
GFR: 104.81 mL/min (ref >60.00)
GLUCOSE: 154 mg/dL — AB (ref 70–99)
Potassium: 4.1 mEq/L (ref 3.5–5.1)
Sodium: 134 mEq/L — ABNORMAL LOW (ref 135–145)
Total Bilirubin: 0.6 mg/dL (ref 0.2–1.2)
Total Protein: 7.9 g/dL (ref 6.0–8.3)

## 2016-09-30 ENCOUNTER — Ambulatory Visit (INDEPENDENT_AMBULATORY_CARE_PROVIDER_SITE_OTHER): Payer: 59 | Admitting: Internal Medicine

## 2016-09-30 ENCOUNTER — Encounter: Payer: Self-pay | Admitting: Internal Medicine

## 2016-09-30 VITALS — BP 136/74 | HR 80 | Ht 65.0 in | Wt 145.2 lb

## 2016-09-30 DIAGNOSIS — K754 Autoimmune hepatitis: Secondary | ICD-10-CM

## 2016-09-30 DIAGNOSIS — F411 Generalized anxiety disorder: Secondary | ICD-10-CM | POA: Diagnosis not present

## 2016-09-30 DIAGNOSIS — E876 Hypokalemia: Secondary | ICD-10-CM | POA: Diagnosis not present

## 2016-09-30 DIAGNOSIS — D508 Other iron deficiency anemias: Secondary | ICD-10-CM

## 2016-09-30 NOTE — Progress Notes (Addendum)
Miranda Cochran 66 y.o. [birth year] [id number]  Assessment & Plan:   Encounter Diagnoses  Name Primary?  . Autoimmune hepatitis treated with steroids ([place]) Yes  . Other iron deficiency anemia   . Hypokalemia   . Generalized anxiety disorder     She is improving on budesonide. I educated her about her problem today. She is not ready to change therapy and add immunosuppressives after reading about these. She openly admits she is anxious about all of this. We will continue with current plan and anticipate starting immunosuppressant therapy at some point. I am hopeful that we can do that. Hypokalemia resolved with dietary mentation example bananas will continue to follow that. It may also be better because she reduced her diuretic dose on her own.  I'm going to check Hemoccults regarding the iron deficiency. If positive will need to consider EGD. Colonoscopy was negative in 2015 for any significant bleeding lesions. However we could need to repeat that.  Hopefully work stress will relent. She has spoken to the owner the company about the problems.  Autoimmune hepatitis ([place]) LFTs are improved. Emotionally not ready to move forward with other immunosuppressant medication. We'll continue same and return to clinic in 3 months monitoring labs along the way.  He is going to need hepatitis A and pneumonia vaccinations. I'm going to give her him time given her anxiety over treatment in general. I would also strongly consider the recombinant shingles vaccine if she is going to use azathioprine.    I appreciate the opportunity to care for this patient. CC: Laurita Quint, FNP   RTC 3 months  Subjective:   Chief Complaint: Autoimmune hepatitis, follow-up after diagnosis  HPI Patient is here for follow-up. She had a liver biopsy that showed autoimmune hepatitis after I saw her for abnormal liver function tests a few months ago. She was started on Entocort and has tolerated that well  though she has had some fluctuating blood sugars. Stressful make them go up she says. They were up the other week and down now. She is quite stressed at work however. She has a new supervisor and that's not going well for her and others and it is bothering her. She has read about azathioprine and is not inclined to start that yet due to possible side effects. I was going to start her on potassium supplementation and thought what I had prescribed were small capsules but she said that they were still large, even though they were the Micro-K, and she decided just to eat bananas instead. She is also reduced her diuretic dosages also though that was not related to the potassium I think it was due to less edema. She is taking her iron supplements.  Wt Readings from Last 3 Encounters:  09/30/16 145 lb 4 oz (65.9 kg)  09/14/16 141 lb (64 kg)  08/13/16 141 lb 6 oz (64.1 kg)   Medications, allergies, past medical history, past surgical history, family history and social history are reviewed and updated in the EMR.   Review of Systems anxious  Objective:   Physical Exam BP 136/74 (BP Location: Left Arm, Patient Position: Sitting, Cuff Size: Normal)   Pulse 80   Ht 5\' 5"  (1.651 m)   Wt 145 lb 4 oz (65.9 kg)   BMI 24.17 kg/m  Anicteric Lungs cta Cor s1s2 no rmg abd soft nt no hsm/mass Ext mo edema    Chemistry      Component Value Date/Time   NA 134 (L)  09/29/2016 0858   NA 137 05/14/2011   K 4.1 09/29/2016 0858   CL 102 09/29/2016 0858   CO2 27 09/29/2016 0858   BUN 14 09/29/2016 0858   BUN 12 05/14/2011   CREATININE 0.61 09/29/2016 0858   CREATININE 0.65 11/01/2015 0959   GLU 100 05/14/2011      Component Value Date/Time   CALCIUM 9.1 09/29/2016 0858   ALKPHOS 96 09/29/2016 0858   AST 42 (H) 09/29/2016 0858   ALT 39 (H) 09/29/2016 0858   BILITOT 0.6 09/29/2016 0858     Lab Results  Component Value Date   WBC 9.5 09/29/2016   HGB 10.0 (L) 09/29/2016   HCT 31.2 (L) 09/29/2016     MCV 82.1 09/29/2016   PLT 189.0 09/29/2016

## 2016-09-30 NOTE — Patient Instructions (Signed)
Your physician has requested that you go to the basement for the following lab work in a month, no appointment is needed.  Please do the hemoccult cards and mail them back to [place].   Follow up with Dr Carlean Purl in 3 months.   I appreciate the opportunity to care for you. Silvano Rusk, MD, [facility]

## 2016-10-04 NOTE — Assessment & Plan Note (Addendum)
LFTs are improved. Emotionally not ready to move forward with other immunosuppressant medication. We'll continue same and return to clinic in 3 months monitoring labs along the way.  He is going to need hepatitis A and pneumonia vaccinations. I'm going to give her him time given her anxiety over treatment in general. I would also strongly consider the recombinant shingles vaccine if she is going to use azathioprine.

## 2016-10-08 ENCOUNTER — Other Ambulatory Visit (INDEPENDENT_AMBULATORY_CARE_PROVIDER_SITE_OTHER): Payer: 59

## 2016-10-08 DIAGNOSIS — E1165 Type 2 diabetes mellitus with hyperglycemia: Secondary | ICD-10-CM | POA: Diagnosis not present

## 2016-10-08 LAB — BASIC METABOLIC PANEL
BUN: 12 mg/dL (ref 6–23)
CHLORIDE: 94 meq/L — AB (ref 96–112)
CO2: 28 meq/L (ref 19–32)
Calcium: 9.4 mg/dL (ref 8.4–10.5)
Creatinine, Ser: 0.6 mg/dL (ref 0.40–1.20)
GFR: 106.82 mL/min (ref >60.00)
Glucose, Bld: 88 mg/dL (ref 70–99)
POTASSIUM: 3.3 meq/L — AB (ref 3.5–5.1)
Sodium: 131 mEq/L — ABNORMAL LOW (ref 135–145)

## 2016-10-10 LAB — FRUCTOSAMINE: Fructosamine: 352 umol/L — ABNORMAL HIGH (ref 0–285)

## 2016-10-12 ENCOUNTER — Ambulatory Visit (INDEPENDENT_AMBULATORY_CARE_PROVIDER_SITE_OTHER): Payer: 59 | Admitting: Endocrinology

## 2016-10-12 ENCOUNTER — Encounter: Payer: Self-pay | Admitting: Endocrinology

## 2016-10-12 VITALS — BP 146/90 | HR 90 | Ht 65.0 in | Wt 143.8 lb

## 2016-10-12 DIAGNOSIS — E1165 Type 2 diabetes mellitus with hyperglycemia: Secondary | ICD-10-CM | POA: Diagnosis not present

## 2016-10-12 DIAGNOSIS — E871 Hypo-osmolality and hyponatremia: Secondary | ICD-10-CM

## 2016-10-12 DIAGNOSIS — E782 Mixed hyperlipidemia: Secondary | ICD-10-CM | POA: Diagnosis not present

## 2016-10-12 MED ORDER — GLIMEPIRIDE 2 MG PO TABS: 2.0000 mg | ORAL_TABLET | Freq: Every day | ORAL | 3 refills | Status: DC

## 2016-10-12 MED ORDER — ROSUVASTATIN CALCIUM 5 MG PO TABS
5.0000 mg | ORAL_TABLET | Freq: Every day | ORAL | 3 refills | Status: DC
Start: 2016-10-12 — End: 2016-11-24

## 2016-10-12 MED ORDER — DULAGLUTIDE 1.5 MG/0.5ML ~~LOC~~ SOAJ: SUBCUTANEOUS | 1 refills | Status: DC

## 2016-10-12 NOTE — Progress Notes (Signed)
[name], female   DOB: 05/19/1951, 66 y.o.   MRN: [id number]            Reason for Appointment: Follow-up for Type 2 Diabetes    History of Present Illness:          Date of diagnosis of type 2 diabetes mellitus: ?  2013        Background history:   She had a blood sugar over 200 in 2013 but was not told to have diabetes until 04/2015 A1c was 7.3 in 3/16 In 04/2015 blood sugar was 478 says her blood sugar was 600 when she was diagnosed and was not having any significant symptoms Initially treated with Metaglip but this apparently caused abdominal discomfort and she was switched to metformin In 2017 she had been tried on Bydureon for better control but she stopped this after a few weeks because of large persisted nodules in her skin She thinks her blood sugars were better with this and A1c was down to 6.8  Recent history:   Non-insulin hypoglycemic drugs the patient is taking are: Trulicity 7.04 mg weekly and metformin 500 mg twice a day  Current management, blood sugar patterns and problems identified:  Her blood sugars were poorly controlled when she was first seen despite her A1c only being 7%, she was on glipizide and metformin  She has been started on Trulicity about 4 weeks ago and she has taken 0.75 mg weekly  She did have mild nausea initially but did not have any significant nausea with the last injection last Friday  Because of her having abdominal discomfort with the glipizide she has stopped this in the last few days and she thinks the symptoms are better  She is checking her blood sugars somewhat sporadically  Her blood sugars in the mornings have been better in the last 2 weeks or so but has only 3 readings  She still has readings at least over 200 in the afternoons and evenings, again checking sporadically  She thinks blood sugars are very high when she is indulging in a lot of sweets which she is not totally controlling; currently waiting for  an appointment with the dietitian  She is also on Entocort for her hepatitis  She says she is too busy to exercise but has started walking on the weekends recently.        Side effects from medications have been: Metaglip caused abdominal pain, Tradjenta caused swelling of the legs, Bydureon caused skin nodules, Farxiga caused diarrhea  Compliance with the medical regimen: Inconsistent  Glucose monitoring:  done less than 1 times a day         Glucometer: One Touch.      Blood Glucose readings by time of day   Mean values apply above for all meters except median for One Touch  PRE-MEAL Fasting Lunch Dinner Bedtime Overall  Glucose range:  1 24-440    223-423   291-377    Mean/median: 140     291+/-110    Self-care:  Typical meal intake: Breakfast is Oatmeal, less  fast food Recently, usually eating low-fat dinner and her snacks will be fruit, peanut butter crackers                Dietician visit, most recent: Scheduled for 10/21/16               Exercise:  trying to walk 2/7 days  Weight history:  Wt Readings from Last 3  Encounters:  10/12/16 143 lb 12.8 oz (65.2 kg)  09/30/16 145 lb 4 oz (65.9 kg)  09/14/16 141 lb (64 kg)    Glycemic control:   Lab Results  Component Value Date   HGBA1C 7.0 09/14/2016   HGBA1C 8.3 (H) 05/24/2016   HGBA1C 6.8 11/01/2015   Lab Results  Component Value Date   MICROALBUR 10.5 (H) 05/24/2016   LDLCALC 145 (H) 05/24/2016   CREATININE 0.60 10/08/2016   Lab Results  Component Value Date   MICRALBCREAT 7.8 05/24/2016    Lab Results  Component Value Date   FRUCTOSAMINE 352 (H) 10/08/2016      Allergies as of 10/12/2016      Reactions   Fentanyl Anxiety   Made her feel very anxious, requests not to get it.   Levofloxacin    hallucinations   Prednisone    Anxiety and paranoia   Amoxicillin Other (See Comments)   diarrhea   Augmentin [amoxicillin-pot Clavulanate] Other (See Comments)   diarrhea   Farxiga [dapagliflozin]  Diarrhea   Simvastatin    myalgia      Medication List       Accurate as of 10/12/16  9:35 PM. Always use your most recent med list.          ALPRAZolam 0.5 MG tablet Commonly known as:  XANAX Take 1 tablet (0.5 mg total) by mouth at bedtime as needed for anxiety.   aspirin 325 MG tablet Take 325 mg by mouth daily as needed.   azelastine 0.05 % ophthalmic solution Commonly known as:  OPTIVAR instill 1 drop into affected eye DAILY   blood glucose meter kit and supplies Kit Dispense based on patient and insurance preference. Use up to four times daily as directed. (FOR ICD-9 250.00, 250.01).   budesonide 3 MG 24 hr capsule Commonly known as:  ENTOCORT EC Take 3 capsules (9 mg total) by mouth daily.   cyclobenzaprine 10 MG tablet Commonly known as:  FLEXERIL take 1 tablet by mouth at bedtime if needed   Dulaglutide 1.5 MG/0.5ML Sopn Commonly known as:  TRULICITY Inject Weekly   EFFEXOR XR 75 MG 24 hr capsule Generic drug:  venlafaxine XR take 1 capsule by mouth twice a day WITH A MEAL   ferrous sulfate 325 (65 FE) MG tablet Take 1 tablet (325 mg total) by mouth daily with breakfast.   Fluticasone-Salmeterol 100-50 MCG/DOSE Aepb Commonly known as:  ADVAIR DISKUS Inhale 1 puff into the lungs 2 (two) times daily.   furosemide 20 MG tablet Commonly known as:  LASIX Take 1 tablet (20 mg total) by mouth daily as needed.   glimepiride 2 MG tablet Commonly known as:  AMARYL Take 1 tablet (2 mg total) by mouth daily before breakfast.   glucose blood test strip Use as instructed   lansoprazole 30 MG capsule Commonly known as:  PREVACID Take 1 capsule (30 mg total) by mouth daily.   metFORMIN 500 MG tablet Commonly known as:  GLUCOPHAGE Take 1 tablet (500 mg total) by mouth 2 (two) times daily with a meal.   montelukast 10 MG tablet Commonly known as:  SINGULAIR TAKE 1 TABLET BY MOUTH AT BEDTIME.   onetouch ultrasoft lancets Use as instructed     oxyCODONE-acetaminophen 5-325 MG tablet Commonly known as:  PERCOCET/ROXICET Take 1 tablet by mouth every 8 (eight) hours as needed.   promethazine 12.5 MG tablet Commonly known as:  PHENERGAN Take 1 tablet (12.5 mg total) by mouth every 8 (eight) hours  as needed for nausea or vomiting.   pseudoephedrine 30 MG tablet Commonly known as:  SUDAFED Take 30 mg by mouth every 4 (four) hours as needed for congestion.   rosuvastatin 5 MG tablet Commonly known as:  CRESTOR Take 1 tablet (5 mg total) by mouth daily.   triamterene-hydrochlorothiazide 75-50 MG tablet Commonly known as:  MAXZIDE       Allergies:  Allergies  Allergen Reactions  . Fentanyl Anxiety    Made her feel very anxious, requests not to get it.  . Levofloxacin     hallucinations  . Prednisone     Anxiety and paranoia  . Amoxicillin Other (See Comments)    diarrhea  . Augmentin [Amoxicillin-Pot Clavulanate] Other (See Comments)    diarrhea  . Farxiga [Dapagliflozin] Diarrhea  . Simvastatin     myalgia    Past Medical History:  Diagnosis Date  . Allergy   . Anxiety   . Asthma   . Autoimmune hepatitis ([place]) 08/18/2016   08/2016 liver bx confirms  . Depression   . DM (diabetes mellitus) ([place])   . GERD (gastroesophageal reflux disease)   . Hepatic cirrhosis ([place])   . Hiatal hernia   . Hx of adenomatous polyp of colon 07/07/2009  . Hyperlipidemia   . Hypertension   . IBS (irritable bowel syndrome)   . Sinusitis, chronic   . Thyroid nodule   . Umbilical hernia   . Uterine fibroid   . Vascular disease   . Vitamin D deficiency     Past Surgical History:  Procedure Laterality Date  . CATARACT EXTRACTION Bilateral   . COLONOSCOPY  11/1991, 06/2009  . ESOPHAGOGASTRODUODENOSCOPY  02/2002  . EYE SURGERY    . NASAL SEPTOPLASTY W/ TURBINOPLASTY    . TONSILLECTOMY      Family History  Problem Relation Age of Onset  . Stroke Mother   . Hypertension Mother   . Colon cancer Father   . Liver cancer Father    . Heart disease Sister     hole in heart  . Stroke Maternal Grandmother   . Hypertension Maternal Grandmother   . Esophageal cancer Maternal Grandfather   . Breast cancer Paternal Grandmother   . Brain cancer Paternal Grandmother   . Lung cancer Paternal Grandfather   . Diabetes Neg Hx     Social History:  reports that she has never smoked. She has never used smokeless tobacco. She reports that she drinks alcohol. She reports that she does not use drugs.   Review of Systems   Lipid history: Previously had tried Zocor which apparently caused muscle aches, she took Crestor for some time but she stopped this previously She was told to start back on Crestor which she has not done so yet, she does not know how old her medication is    Lab Results  Component Value Date   CHOL 209 (H) 05/24/2016   HDL 37.60 (L) 05/24/2016   LDLCALC 145 (H) 05/24/2016   TRIG 134.0 05/24/2016   CHOLHDL 6 05/24/2016           Lab Results  Component Value Date   ALT 39 (H) 09/29/2016    Hypertension: None, she says she is taking Maxzide because of tendency to swelling in her abdomen Blood pressure is higher today because of taking Sudafed  BP Readings from Last 3 Encounters:  10/12/16 (!) 146/90  09/30/16 136/74  09/14/16 128/70   MUSCLE cramps: She is having more frequent nocturnal cramps She does  have low potassium level, does not like to take supplements because of large tablets More recently she has taken Lasix instead of Maxzide although she does not have any dependent or leg edema, she just thinks she gets swollen and bloated. She does not know that Lasix will cause low potassium  Most recent eye exam was 7/17 She has evidence of neuropathy on exam  Most recent foot exam:09/2016    LABS:  Lab on 10/08/2016  Component Date Value Ref Range Status  . Fructosamine 10/08/2016 352* 0 - 285 umol/L Final   Comment: Published reference interval for apparently healthy subjects between  age 28 and 78 is 33 - 285 umol/L and in a poorly controlled diabetic population is 228 - 563 umol/L with a mean of 396 umol/L.   Marland Kitchen Sodium 10/08/2016 131* 135 - 145 mEq/L Final  . Potassium 10/08/2016 3.3* 3.5 - 5.1 mEq/L Final  . Chloride 10/08/2016 94* 96 - 112 mEq/L Final  . CO2 10/08/2016 28  19 - 32 mEq/L Final  . Glucose, Bld 10/08/2016 88  70 - 99 mg/dL Final  . BUN 10/08/2016 12  6 - 23 mg/dL Final  . Creatinine, Ser 10/08/2016 0.60  0.40 - 1.20 mg/dL Final  . Calcium 10/08/2016 9.4  8.4 - 10.5 mg/dL Final  . GFR 10/08/2016 106.82  >60.00 mL/min Final    Physical Examination:  BP (!) 146/90   Pulse 90   Ht _0  (1.651 m)   Wt 143 lb 12.8 oz (65.2 kg)   SpO2 97%   BMI 23.93 kg/m          ASSESSMENT:  Diabetes type 2, uncontrolled with normal BMI of 23     See history of present illness for detailed discussion of current diabetes management, blood sugar patterns and problems identified  Currently she is on Trulicity and  low-dose metformin Although her fasting readings are improving with adding Trulicity she still has significant only high postprandial readings Some readings are over 300 and not clear this is partly related to steroids She has been recommended insulin but she continues to refuse this and says that her sugars are high because of stress, inconsistent diet She has stopped her glipizide because of reported abdominal discomfort She can improve her diet and is waiting for appointment with dietitian this month  Overall blood sugars are mostly high with fructosamine 352   HYPONATREMIA: Likely to be from taking HCTZ  Muscle cramps: Has hypokalemia related to taking Lasix or Maxzide and no potassium supplements   PLAN:    Increase Trulicity up to 1.5 mg to improve blood sugars  Start Amaryl 2 mg instead of glipizide in the morning  Discussed that she needs to check blood sugars more consistently after meals  Cut back on carbohydrates and simple  sugars  More regular exercise  She will consider mealtime insulin if she has no improvement in her blood sugars on the next visit  Continue metformin, increasing the dose may also be helpful  Follow-up in 1 month  For her hyperlipidemia she agrees to start Crestor, has tried this before without side effects and recent ALT is nearly normal  Hypokalemia/hyponatremia: She does not need to take any diuretics that she does not have any evidence of edema and she needs to discuss management with PCP Also recommended avoiding Sudafed as blood pressure is significantly high with this    Patient Instructions  Check blood sugars on waking up  3x weekly  Also check blood  sugars about 2 hours after a meal and do this after different meals by rotation  Recommended blood sugar levels on waking up is 90-130 and about 2 hours after meal is 130-160  Please bring your blood sugar monitor to each visit, thank you  Stop lasix and call PCP   Counseling time on subjects discussed above is over 50% of today's 25 minute visit    Bain Whichard 10/12/2016, 9:35 PM   Note: This office note was prepared with Dragon voice recognition system technology. Any transcriptional errors that result from this process are unintentional.  Current

## 2016-10-12 NOTE — Patient Instructions (Addendum)
Check blood sugars on waking up  3x weekly  Also check blood sugars about 2 hours after a meal and do this after different meals by rotation  Recommended blood sugar levels on waking up is 90-130 and about 2 hours after meal is 130-160  Please bring your blood sugar monitor to each visit, thank you  Stop lasix and call PCP

## 2016-10-18 ENCOUNTER — Other Ambulatory Visit (INDEPENDENT_AMBULATORY_CARE_PROVIDER_SITE_OTHER): Payer: 59

## 2016-10-18 ENCOUNTER — Encounter: Payer: Self-pay | Admitting: Internal Medicine

## 2016-10-18 ENCOUNTER — Other Ambulatory Visit: Payer: Self-pay | Admitting: Internal Medicine

## 2016-10-18 DIAGNOSIS — D508 Other iron deficiency anemias: Secondary | ICD-10-CM | POA: Diagnosis not present

## 2016-10-18 LAB — HEMOCCULT SLIDES (X 3 CARDS)
Fecal Occult Blood: NEGATIVE
OCCULT 1: POSITIVE — AB
OCCULT 2: POSITIVE — AB
OCCULT 3: POSITIVE — AB
OCCULT 4: NEGATIVE
OCCULT 5: NEGATIVE

## 2016-10-18 NOTE — Progress Notes (Signed)
Has blood in some of the stools - so she should at least have an EGD but I am obligated to recommend an EGD and a colonoscopy for work-up of iron deficiency anemia and heme + stools   We can start with an EGD and that might give [place] the answer and avoid a colonoscopy vs scheduling both at once - there are pros and cons to both ways

## 2016-10-20 ENCOUNTER — Telehealth: Payer: Self-pay | Admitting: Internal Medicine

## 2016-10-20 DIAGNOSIS — K754 Autoimmune hepatitis: Secondary | ICD-10-CM

## 2016-10-20 DIAGNOSIS — D508 Other iron deficiency anemias: Secondary | ICD-10-CM

## 2016-10-21 ENCOUNTER — Encounter: Payer: 59 | Admitting: Dietician

## 2016-10-21 NOTE — Telephone Encounter (Signed)
Okay but I really need to see her in June, July at the latest. We can discuss it then. She should do a CBC and CMET by the beginning of June please. If I have ordered something sooner we can go with that but I would like her to have labs done by early June.

## 2016-10-21 NOTE — Telephone Encounter (Signed)
Patient called back and cancelled both the endo and colonoscopy that she scheduled with me yesterday.  She wants to wait and see if the Hgb will continue to improve.

## 2016-10-21 NOTE — Telephone Encounter (Signed)
Patient notified she will come for labs in early June and follow up with Dr. Carlean Purl on 6/29/1/8

## 2016-11-05 ENCOUNTER — Encounter: Payer: 59 | Admitting: Internal Medicine

## 2016-11-12 ENCOUNTER — Encounter: Payer: 59 | Admitting: Dietician

## 2016-11-17 ENCOUNTER — Other Ambulatory Visit (INDEPENDENT_AMBULATORY_CARE_PROVIDER_SITE_OTHER): Payer: 59

## 2016-11-17 DIAGNOSIS — D508 Other iron deficiency anemias: Secondary | ICD-10-CM

## 2016-11-17 DIAGNOSIS — K754 Autoimmune hepatitis: Secondary | ICD-10-CM | POA: Diagnosis not present

## 2016-11-17 LAB — CBC WITH DIFFERENTIAL/PLATELET
BASOS ABS: 0.1 10*3/uL (ref 0.0–0.1)
BASOS PCT: 0.5 % (ref 0.0–3.0)
EOS PCT: 0.5 % (ref 0.0–5.0)
Eosinophils Absolute: 0.1 10*3/uL (ref 0.0–0.7)
HEMATOCRIT: 34.8 % — AB (ref 36.0–46.0)
Hemoglobin: 11.7 g/dL — ABNORMAL LOW (ref 12.0–15.0)
LYMPHS ABS: 2.8 10*3/uL (ref 0.7–4.0)
LYMPHS PCT: 27.1 % (ref 12.0–46.0)
MCHC: 33.5 g/dL (ref 30.0–36.0)
MCV: 84.7 fl (ref 78.0–100.0)
MONOS PCT: 8 % (ref 3.0–12.0)
Monocytes Absolute: 0.8 10*3/uL (ref 0.1–1.0)
NEUTROS ABS: 6.6 10*3/uL (ref 1.4–7.7)
NEUTROS PCT: 63.9 % (ref 43.0–77.0)
PLATELETS: 229 10*3/uL (ref 150.0–400.0)
RBC: 4.11 Mil/uL (ref 3.87–5.11)
RDW: 18.7 % — AB (ref 11.5–15.5)
WBC: 10.3 10*3/uL (ref 4.0–10.5)

## 2016-11-17 LAB — COMPREHENSIVE METABOLIC PANEL
ALBUMIN: 4 g/dL (ref 3.5–5.2)
ALK PHOS: 89 U/L (ref 39–117)
ALT: 27 U/L (ref 0–35)
AST: 27 U/L (ref 0–37)
BUN: 10 mg/dL (ref 6–23)
CO2: 27 mEq/L (ref 19–32)
CREATININE: 0.59 mg/dL (ref 0.40–1.20)
Calcium: 9.5 mg/dL (ref 8.4–10.5)
Chloride: 99 mEq/L (ref 96–112)
GFR: 108.87 mL/min (ref >60.00)
Glucose, Bld: 131 mg/dL — ABNORMAL HIGH (ref 70–99)
Potassium: 3.7 mEq/L (ref 3.5–5.1)
Sodium: 132 mEq/L — ABNORMAL LOW (ref 135–145)
TOTAL PROTEIN: 7.8 g/dL (ref 6.0–8.3)
Total Bilirubin: 0.7 mg/dL (ref 0.2–1.2)

## 2016-11-17 NOTE — Progress Notes (Signed)
Labs great My Chart message

## 2016-11-18 ENCOUNTER — Telehealth: Payer: Self-pay | Admitting: Internal Medicine

## 2016-11-18 ENCOUNTER — Other Ambulatory Visit: Payer: Self-pay | Admitting: Internal Medicine

## 2016-11-18 ENCOUNTER — Telehealth: Payer: Self-pay | Admitting: Family Medicine

## 2016-11-18 NOTE — Telephone Encounter (Signed)
Message was sent to patient by D.r Carlean Purl via mychart.   Left message for patient to call back

## 2016-11-18 NOTE — Telephone Encounter (Signed)
Patient can't get into my chart any more.  I reviewed the labs her.

## 2016-11-18 NOTE — Telephone Encounter (Signed)
Transferred pt to [facility] phone to resched dietician appt on 11/26/2016 8:15am.  No answer, pt left vmail.  Thank you,  -LL

## 2016-11-19 ENCOUNTER — Other Ambulatory Visit (INDEPENDENT_AMBULATORY_CARE_PROVIDER_SITE_OTHER): Payer: 59

## 2016-11-19 DIAGNOSIS — E1165 Type 2 diabetes mellitus with hyperglycemia: Secondary | ICD-10-CM

## 2016-11-19 LAB — LIPID PANEL
Cholesterol: 217 mg/dL — ABNORMAL HIGH (ref 0–200)
HDL: 57.8 mg/dL (ref >39.00)
LDL Cholesterol: 144 mg/dL — ABNORMAL HIGH (ref 0–99)
NONHDL: 159.14
Total CHOL/HDL Ratio: 4
Triglycerides: 77 mg/dL (ref 0.0–149.0)
VLDL: 15.4 mg/dL (ref 0.0–40.0)

## 2016-11-19 LAB — GLUCOSE, RANDOM: GLUCOSE: 105 mg/dL — AB (ref 70–99)

## 2016-11-19 LAB — HEMOGLOBIN A1C: HEMOGLOBIN A1C: 6.3 % (ref 4.6–6.5)

## 2016-11-24 ENCOUNTER — Telehealth: Payer: Self-pay

## 2016-11-24 ENCOUNTER — Encounter: Payer: Self-pay | Admitting: Endocrinology

## 2016-11-24 ENCOUNTER — Ambulatory Visit (INDEPENDENT_AMBULATORY_CARE_PROVIDER_SITE_OTHER): Payer: 59 | Admitting: Endocrinology

## 2016-11-24 VITALS — BP 138/92 | HR 93 | Ht 65.0 in | Wt 144.0 lb

## 2016-11-24 DIAGNOSIS — E782 Mixed hyperlipidemia: Secondary | ICD-10-CM | POA: Diagnosis not present

## 2016-11-24 DIAGNOSIS — E1165 Type 2 diabetes mellitus with hyperglycemia: Secondary | ICD-10-CM

## 2016-11-24 MED ORDER — DULAGLUTIDE 1.5 MG/0.5ML ~~LOC~~ SOAJ: SUBCUTANEOUS | 3 refills | Status: DC

## 2016-11-24 MED ORDER — PITAVASTATIN CALCIUM 1 MG PO TABS: ORAL_TABLET | ORAL | 3 refills | Status: DC

## 2016-11-24 NOTE — Patient Instructions (Addendum)
Balanced meals with protein  More sugars after supper/ bedtime  Metformin     INDOOR EXERCISE IDEAS   Use the following examples for a creative indoor workout (perform each move for 2-3 minutes):   Warm up. Put on some music that makes you feel like moving, and dance around the living room.  Watch exercise shows on TV and move along with them. There are tons of free cable channels that have daily exercise shows on them for all levels - beginner through advanced.   You can easily find a number of exercise videos but use one that will suit your liking and exercise level; you can do these on your own schedule.  Walk up and down the steps.  Do dumbbell curls and presses (if you don't have weights, use full water bottles).  Do assisted squats, keeping your back on a fitness ball against the wall or using the back of the couch for support.  Shadow box: Lift and lower the left leg; jab with the right arm, then the left; then lift and lower the right leg.  Fence (you don't even need swords). Pretend you're holding a sword in each hand. Create an X pattern standing still, then moving forward and back.  Hop on your exercise bike or treadmill -- or, for something different, use a weighted hula hoop. If you don't have any of those, just go back to dancing.  Do abdominal crunches (hold a weighted ball for added resistance).  Cool down with [employer] "I Feel Good" -- or whatever tune makes you feel good

## 2016-11-24 NOTE — Telephone Encounter (Signed)
Received approval for brand name Effexor XR 75 mg capsules through 11/24/2017.

## 2016-11-24 NOTE — Progress Notes (Signed)
[name], female   DOB: 01/08/1951, 66 y.o.   MRN: [id number]          Reason for Appointment: Follow-up for Type 2 Diabetes    History of Present Illness:          Date of diagnosis of type 2 diabetes mellitus: ?  2013        Background history:   She had a blood sugar over 200 in 2013 but was not told to have diabetes until 04/2015 A1c was 7.3 in 3/16 In 04/2015 blood sugar was 478 says her blood sugar was 600 when she was diagnosed and was not having any significant symptoms Initially treated with Metaglip but this apparently caused abdominal discomfort and she was switched to metformin In 2017 she had been tried on Bydureon for better control but she stopped this after a few weeks because of large persisted nodules in her skin She thinks her blood sugars were better with this and A1c was down to 6.8  Recent history:   Non-insulin hypoglycemic drugs the patient is taking are: Trulicity 1.5 mg weekly and metformin 500 mg twice a day  Her A1c is now 6.3, has been as high as 8.3 last December  Current management, blood sugar patterns and problems identified:  Her Trulicity was increased up to 1.5 mg about 6 weeks ago when she was having blood sugars averaging about 290  She has done blood sugars mostly fasting and not clear if she is having high readings after meals consistently  However she thinks she is able to cut back on her portions and carbohydrates better with starting Trulicity 1.5 mg and has no nausea  She has had sporadic high reading after breakfast and lunch  Also fasting readings are relatively high in the last few days    She is also on Entocort for her hepatitis  She says she is too busy to exercise        Side effects from medications have been: Metaglip caused abdominal pain, Tradjenta caused swelling of the legs, Bydureon caused skin nodules, Farxiga caused diarrhea  Compliance with the medical regimen: Inconsistent  Glucose  monitoring:  done less than 1 times a day         Glucometer: One Touch.      Blood Glucose readings by time of day   Mean values apply above for all meters except median for One Touch  PRE-MEAL Fasting Lunch Dinner Bedtime Overall  Glucose range: 103-161  1 13-238  166     Mean/median: 141     145    POST-MEAL PC Breakfast PC Lunch PC Dinner  Glucose range:  241    Mean/median:        Self-care:  Typical meal intake: Breakfast is Oatmeal, usually eating low-fat dinner and her snacks will be fruit, peanut butter crackers                Dietician visit, most recent:               Exercise:  trying to walk 2/7 days  Weight history:  Wt Readings from Last 3 Encounters:  11/24/16 144 lb (65.3 kg)  10/12/16 143 lb 12.8 oz (65.2 kg)  09/30/16 145 lb 4 oz (65.9 kg)    Glycemic control:   Lab Results  Component Value Date   HGBA1C 6.3 11/19/2016   HGBA1C 7.0 09/14/2016   HGBA1C 8.3 (H) 05/24/2016   Lab Results  Component Value  Date   MICROALBUR 10.5 (H) 05/24/2016   LDLCALC 144 (H) 11/19/2016   CREATININE 0.59 11/17/2016   Lab Results  Component Value Date   MICRALBCREAT 7.8 05/24/2016    Lab Results  Component Value Date   FRUCTOSAMINE 352 (H) 10/08/2016      Allergies as of 11/24/2016      Reactions   Fentanyl Anxiety   Made her feel very anxious, requests not to get it.   Levofloxacin    hallucinations   Prednisone    Anxiety and paranoia   Amoxicillin Other (See Comments)   diarrhea   Augmentin [amoxicillin-pot Clavulanate] Other (See Comments)   diarrhea   Farxiga [dapagliflozin] Diarrhea   Rosuvastatin Calcium    Severe cramping Not able to sleep   Simvastatin    myalgia      Medication List       Accurate as of 11/24/16  1:17 PM. Always use your most recent med list.          ALPRAZolam 0.5 MG tablet Commonly known as:  XANAX Take 1 tablet (0.5 mg total) by mouth at bedtime as needed for anxiety.   aspirin 325 MG tablet Take 325 mg  by mouth daily as needed.   azelastine 0.05 % ophthalmic solution Commonly known as:  OPTIVAR instill 1 drop into affected eye DAILY   blood glucose meter kit and supplies Kit Dispense based on patient and insurance preference. Use up to four times daily as directed. (FOR ICD-9 250.00, 250.01).   budesonide 3 MG 24 hr capsule Commonly known as:  ENTOCORT EC take 3 capsules by mouth once daily   cyclobenzaprine 10 MG tablet Commonly known as:  FLEXERIL take 1 tablet by mouth at bedtime if needed   Dulaglutide 1.5 MG/0.5ML Sopn Commonly known as:  TRULICITY Inject Weekly   EFFEXOR XR 75 MG 24 hr capsule Generic drug:  venlafaxine XR take 1 capsule by mouth twice a day WITH A MEAL   ferrous sulfate 325 (65 FE) MG tablet Take 1 tablet (325 mg total) by mouth daily with breakfast.   Fluticasone-Salmeterol 100-50 MCG/DOSE Aepb Commonly known as:  ADVAIR DISKUS Inhale 1 puff into the lungs 2 (two) times daily.   furosemide 20 MG tablet Commonly known as:  LASIX Take 1 tablet (20 mg total) by mouth daily as needed.   glimepiride 2 MG tablet Commonly known as:  AMARYL Take 1 tablet (2 mg total) by mouth daily before breakfast.   glucose blood test strip Use as instructed   lansoprazole 30 MG capsule Commonly known as:  PREVACID Take 1 capsule (30 mg total) by mouth daily.   metFORMIN 500 MG tablet Commonly known as:  GLUCOPHAGE Take 1 tablet (500 mg total) by mouth 2 (two) times daily with a meal.   montelukast 10 MG tablet Commonly known as:  SINGULAIR TAKE 1 TABLET BY MOUTH AT BEDTIME.   onetouch ultrasoft lancets Use as instructed   oxyCODONE-acetaminophen 5-325 MG tablet Commonly known as:  PERCOCET/ROXICET Take 1 tablet by mouth every 8 (eight) hours as needed.   Pitavastatin Calcium 1 MG Tabs Commonly known as:  LIVALO 1 tab daily   promethazine 12.5 MG tablet Commonly known as:  PHENERGAN Take 1 tablet (12.5 mg total) by mouth every 8 (eight) hours as  needed for nausea or vomiting.   pseudoephedrine 30 MG tablet Commonly known as:  SUDAFED Take 30 mg by mouth every 4 (four) hours as needed for congestion.   triamterene-hydrochlorothiazide 75-50  MG tablet Commonly known as:  MAXZIDE       Allergies:  Allergies  Allergen Reactions  . Fentanyl Anxiety    Made her feel very anxious, requests not to get it.  . Levofloxacin     hallucinations  . Prednisone     Anxiety and paranoia  . Amoxicillin Other (See Comments)    diarrhea  . Augmentin [Amoxicillin-Pot Clavulanate] Other (See Comments)    diarrhea  . Farxiga [Dapagliflozin] Diarrhea  . Rosuvastatin Calcium     Severe cramping Not able to sleep  . Simvastatin     myalgia    Past Medical History:  Diagnosis Date  . Allergy   . Anxiety   . Asthma   . Autoimmune hepatitis ([place]) 08/18/2016   08/2016 liver bx confirms  . Depression   . DM (diabetes mellitus) ([place])   . GERD (gastroesophageal reflux disease)   . Hepatic cirrhosis ([place])   . Hiatal hernia   . Hx of adenomatous polyp of colon 07/07/2009  . Hyperlipidemia   . Hypertension   . IBS (irritable bowel syndrome)   . Sinusitis, chronic   . Thyroid nodule   . Umbilical hernia   . Uterine fibroid   . Vascular disease   . Vitamin D deficiency     Past Surgical History:  Procedure Laterality Date  . CATARACT EXTRACTION Bilateral   . COLONOSCOPY  11/1991, 06/2009  . ESOPHAGOGASTRODUODENOSCOPY  02/2002  . EYE SURGERY    . NASAL SEPTOPLASTY W/ TURBINOPLASTY    . TONSILLECTOMY      Family History  Problem Relation Age of Onset  . Stroke Mother   . Hypertension Mother   . Colon cancer Father   . Liver cancer Father   . Heart disease Sister        hole in heart  . Stroke Maternal Grandmother   . Hypertension Maternal Grandmother   . Esophageal cancer Maternal Grandfather   . Breast cancer Paternal Grandmother   . Brain cancer Paternal Grandmother   . Lung cancer Paternal Grandfather   . Diabetes Neg Hx      Social History:  reports that she has never smoked. She has never used smokeless tobacco. She reports that she drinks alcohol. She reports that she does not use drugs.   Review of Systems   Lipid history: Previously had tried Zocor which apparently caused muscle aches, she took Crestor Again recently with a Generic and she thinks the generic causes muscle cramps and she is better with stopping it  for some time but she stopped this     Lab Results  Component Value Date   CHOL 217 (H) 11/19/2016   HDL 57.80 11/19/2016   LDLCALC 144 (H) 11/19/2016   TRIG 77.0 11/19/2016   CHOLHDL 4 11/19/2016           Lab Results  Component Value Date   ALT 27 11/17/2016    Hypertension: None, she says she is taking Maxzide because of tendency to swelling in her abdomen Blood pressure is Usually high   BP Readings from Last 3 Encounters:  11/24/16 (!) 138/92  10/12/16 (!) 146/90  09/30/16 136/74   MUSCLE cramps: She is havingLess cramps recently   Most recent eye exam was 7/17 She has evidence of neuropathy on exam  Most recent foot exam:09/2016    LABS:  Lab on 11/19/2016  Component Date Value Ref Range Status  . Hgb A1c MFr Bld 11/19/2016 6.3  4.6 - 6.5 %  Final   Glycemic Control Guidelines for People with Diabetes:Non Diabetic:  <6%Goal of Therapy: <7%Additional Action Suggested:  >8%   . Glucose, Bld 11/19/2016 105* 70 - 99 mg/dL Final  . Cholesterol 11/19/2016 217* 0 - 200 mg/dL Final   ATP III Classification       Desirable:  < 200 mg/dL               Borderline High:  200 - 239 mg/dL          High:  > = 240 mg/dL  . Triglycerides 11/19/2016 77.0  0.0 - 149.0 mg/dL Final   Normal:  <150 mg/dLBorderline High:  150 - 199 mg/dL  . HDL 11/19/2016 57.80  >39.00 mg/dL Final  . VLDL 11/19/2016 15.4  0.0 - 40.0 mg/dL Final  . LDL Cholesterol 11/19/2016 144* 0 - 99 mg/dL Final  . Total CHOL/HDL Ratio 11/19/2016 4   Final                  Men          Women1/2 Average Risk      3.4          3.3Average Risk          5.0          4.42X Average Risk          9.6          7.13X Average Risk          15.0          11.0                      . NonHDL 11/19/2016 159.14   Final   NOTE:  Non-HDL goal should be 30 mg/dL higher than patient's LDL goal (i.e. LDL goal of < 70 mg/dL, would have non-HDL goal of < 100 mg/dL)    Physical Examination:  BP (!) 138/92   Pulse 93   Ht '5\' 5"'$  (1.651 m)   Wt 144 lb (65.3 kg)   SpO2 98%   BMI 23.96 kg/m          ASSESSMENT:  Diabetes type 2, uncontrolled with normal BMI of 23     See history of present illness for detailed discussion of current diabetes management, blood sugar patterns and problems identified  A1c 6.3 but lower than expected for her sugars  Currently she is on 1.5 mg Trulicity, Amaryl and  low-dose metformin  Overall has shown improvement in her blood sugar control with increasing Trulicity, she is also able to do better with her diet and carbohydrate intake She is not monitoring after meals as discussed above Also does need to exercise Again has not set up appointment with dietitian as discussed before and is not always getting balanced meals  HYPERTENSION: Needs follow-up with her PCP for control    PLAN:    More balanced meals, discussed protein sources  She can do indoor exercises if she cannot walk outside  More readings after meals especially after supper  No change in medications as yet except adding another metformin at suppertime  For her hyperlipidemia she agrees to start Livalo 1 mg daily May need prior authorization Will give her co-pay card also     Patient Instructions  Balanced meals with protein  More sugars after supper/ bedtime  Metformin     INDOOR EXERCISE IDEAS   Use the following examples for a creative indoor workout (perform  each move for 2-3 minutes):   Warm up. Put on some music that makes you feel like moving, and dance around the living room.  Watch  exercise shows on TV and move along with them. There are tons of free cable channels that have daily exercise shows on them for all levels - beginner through advanced.   You can easily find a number of exercise videos but use one that will suit your liking and exercise level; you can do these on your own schedule.  Walk up and down the steps.  Do dumbbell curls and presses (if you don't have weights, use full water bottles).  Do assisted squats, keeping your back on a fitness ball against the wall or using the back of the couch for support.  Shadow box: Lift and lower the left leg; jab with the right arm, then the left; then lift and lower the right leg.  Fence (you don't even need swords). Pretend you're holding a sword in each hand. Create an X pattern standing still, then moving forward and back.  Hop on your exercise bike or treadmill -- or, for something different, use a weighted hula hoop. If you don't have any of those, just go back to dancing.  Do abdominal crunches (hold a weighted ball for added resistance).  Cool down with [employer] "I Feel Good" -- or whatever tune makes you feel good     Counseling time on subjects discussed above is over 50% of today's 25 minute visit    Shawndell Varas 11/24/2016, 1:17 PM   Note: This office note was prepared with Estate agent. Any transcriptional errors that result from this process are unintentional.  Current

## 2016-11-26 ENCOUNTER — Encounter: Payer: 59 | Admitting: Dietician

## 2016-11-30 ENCOUNTER — Telehealth: Payer: Self-pay | Admitting: Endocrinology

## 2016-11-30 NOTE — Telephone Encounter (Signed)
Noted  

## 2016-11-30 NOTE — Telephone Encounter (Signed)
Patient wanted to advise she can not take 3 of the metFORMIN (GLUCOPHAGE) 500 MG tablet. Patient states that it hurts her stomach too much and can only tolerate 2 metFORMIN (GLUCOPHAGE) 500 MG tablet a day.

## 2016-12-06 ENCOUNTER — Telehealth: Payer: Self-pay

## 2016-12-06 DIAGNOSIS — R52 Pain, unspecified: Secondary | ICD-10-CM

## 2016-12-06 MED ORDER — EFFEXOR XR 75 MG PO CP24: ORAL_CAPSULE | ORAL | 5 refills | Status: DC

## 2016-12-06 NOTE — Telephone Encounter (Signed)
Refilled the brand name effexor as advised.

## 2016-12-06 NOTE — Telephone Encounter (Signed)
May we refill her Venlafaxine ER caps 75mg  Sir, thank you.  Seen in April 2018.

## 2016-12-06 NOTE — Telephone Encounter (Signed)
X 6 

## 2016-12-10 ENCOUNTER — Encounter: Payer: Self-pay | Admitting: Internal Medicine

## 2016-12-10 ENCOUNTER — Ambulatory Visit (INDEPENDENT_AMBULATORY_CARE_PROVIDER_SITE_OTHER): Payer: 59 | Admitting: Internal Medicine

## 2016-12-10 VITALS — BP 108/60 | HR 100 | Ht 65.0 in | Wt 142.1 lb

## 2016-12-10 DIAGNOSIS — R1310 Dysphagia, unspecified: Secondary | ICD-10-CM | POA: Diagnosis not present

## 2016-12-10 DIAGNOSIS — R195 Other fecal abnormalities: Secondary | ICD-10-CM

## 2016-12-10 DIAGNOSIS — D508 Other iron deficiency anemias: Secondary | ICD-10-CM | POA: Diagnosis not present

## 2016-12-10 DIAGNOSIS — K754 Autoimmune hepatitis: Secondary | ICD-10-CM | POA: Diagnosis not present

## 2016-12-10 DIAGNOSIS — R1319 Other dysphagia: Secondary | ICD-10-CM

## 2016-12-10 MED ORDER — BUDESONIDE 3 MG PO CPEP: 6.0000 mg | ORAL_CAPSULE | Freq: Every day | ORAL | 2 refills | Status: DC

## 2016-12-10 NOTE — Patient Instructions (Signed)
  You have been scheduled for an endoscopy. Please follow written instructions given to you at your visit today. If you use inhalers (even only as needed), please bring them with you on the day of your procedure.   Normal BMI (Body Mass Index- based on height and weight) is between 19 and 25. Your BMI today is Body mass index is 23.65 kg/m. Marland Kitchen Please consider follow up  regarding your BMI with your Primary Care Provider.  Your physician has requested that you go to the basement for  lab work in a month. No appointment needed. The lab is open 7:30AM-5:30PM Monday thru Friday.    Decrease your Entocort to 6mg  daily per Dr Carlean Purl.    I appreciate the opportunity to care for you. Silvano Rusk, MD, [facility]

## 2016-12-10 NOTE — Progress Notes (Signed)
Miranda Cochran 66 y.o. [birth year] [id number]  Assessment & Plan:   Encounter Diagnoses  Name Primary?  . Autoimmune hepatitis ([place]) Yes  . Heme + stool   . Other iron deficiency anemia   . Esophageal dysphagia      EGDTo evaluate heme positive stool anemia and dysphagia. She is only using her PPI intermittently as needed and needs to take it daily. She declines a colonoscopy at this time Budesonide decrease to 6 mg qd*start azathioprine but she is reluctant to do so so she has responded to the budesonide so we'll try to lower that dose LFT,CBC 1 month hemoglobin has been improving. She's taking the iron intermittently due to GI upset but she is on the right track so we'll continue. The risks and benefits as well as alternatives of endoscopic procedure(s) have been discussed and reviewed. All questions answered. The patient agrees to proceed.   I appreciate the opportunity to care for this patient. CC: Delano Metz, FNP  Subjective:   Chief Complaint:Follow-up of autoimmune hepatitis and heme positive stool  HPI  The patient has had autoimmune hepatitis diagnosed this year she has been treated with budesonide because of diabetes and has had a normalization of her liver function tests. She has a new job is very happy with the transfer in the same company. Sugars are under better control the still an issue. Her cholesterol is not as tightly controlled as her endocrinologist once. She has been intolerant of several medications. She is wary of generics and thinks they bother her. Allergies  Allergen Reactions  . Fentanyl Anxiety    Made her feel very anxious, requests not to get it.  . Levofloxacin     hallucinations  . Prednisone     Anxiety and paranoia  . Amoxicillin Other (See Comments)    diarrhea  . Augmentin [Amoxicillin-Pot Clavulanate] Other (See Comments)    diarrhea  . Farxiga [Dapagliflozin] Diarrhea  . Rosuvastatin Calcium     Severe cramping Not able  to sleep  . Simvastatin     myalgia   Current Meds  Medication Sig  . ALPRAZolam (XANAX) 0.5 MG tablet Take 1 tablet (0.5 mg total) by mouth at bedtime as needed for anxiety.  Marland Kitchen aspirin 325 MG tablet Take 325 mg by mouth daily as needed.   Marland Kitchen azelastine (OPTIVAR) 0.05 % ophthalmic solution instill 1 drop into affected eye DAILY  . blood glucose meter kit and supplies KIT Dispense based on patient and insurance preference. Use up to four times daily as directed. (FOR ICD-9 250.00, 250.01).  . budesonide (ENTOCORT EC) 3 MG 24 hr capsule take 3 capsules by mouth once daily  . cyclobenzaprine (FLEXERIL) 10 MG tablet take 1 tablet by mouth at bedtime if needed  . Dulaglutide (TRULICITY) 1.5 PJ/0.9TO SOPN Inject Weekly  . EFFEXOR XR 75 MG 24 hr capsule take 1 capsule by mouth twice a day WITH A MEAL  . ferrous sulfate 325 (65 FE) MG tablet Take 1 tablet (325 mg total) by mouth daily with breakfast.  . Fluticasone-Salmeterol (ADVAIR DISKUS) 100-50 MCG/DOSE AEPB Inhale 1 puff into the lungs 2 (two) times daily.  . furosemide (LASIX) 20 MG tablet Take 1 tablet (20 mg total) by mouth daily as needed.  Marland Kitchen glimepiride (AMARYL) 2 MG tablet Take 1 tablet (2 mg total) by mouth daily before breakfast.  . glucose blood test strip Use as instructed  . Lancets (ONETOUCH ULTRASOFT) lancets Use as instructed  . lansoprazole (PREVACID) 30  MG capsule Take 1 capsule (30 mg total) by mouth daily.  . metFORMIN (GLUCOPHAGE) 500 MG tablet Take 1 tablet (500 mg total) by mouth 2 (two) times daily with a meal.  . montelukast (SINGULAIR) 10 MG tablet TAKE 1 TABLET BY MOUTH AT BEDTIME.  Marland Kitchen oxyCODONE-acetaminophen (PERCOCET/ROXICET) 5-325 MG tablet Take 1 tablet by mouth every 8 (eight) hours as needed.  . Pitavastatin Calcium (LIVALO) 1 MG TABS 1 tab daily  . promethazine (PHENERGAN) 12.5 MG tablet Take 1 tablet (12.5 mg total) by mouth every 8 (eight) hours as needed for nausea or vomiting.  . pseudoephedrine (SUDAFED) 30  MG tablet Take 30 mg by mouth every 4 (four) hours as needed for congestion.  . triamterene-hydrochlorothiazide (MAXZIDE) 75-50 MG tablet    Past Medical History:  Diagnosis Date  . Allergy   . Anxiety   . Asthma   . Autoimmune hepatitis ([place]) 08/18/2016   08/2016 liver bx confirms  . Depression   . DM (diabetes mellitus) ([place])   . GERD (gastroesophageal reflux disease)   . Hepatic cirrhosis ([place])   . Hiatal hernia   . Hx of adenomatous polyp of colon 07/07/2009  . Hyperlipidemia   . Hypertension   . IBS (irritable bowel syndrome)   . Iron deficiency anemia   . Sinusitis, chronic   . Thyroid nodule   . Umbilical hernia   . Uterine fibroid   . Vascular disease   . Vitamin D deficiency    Past Surgical History:  Procedure Laterality Date  . CATARACT EXTRACTION Bilateral   . COLONOSCOPY  11/1991, 06/2009  . ESOPHAGOGASTRODUODENOSCOPY  02/2002  . EYE SURGERY    . NASAL SEPTOPLASTY W/ TURBINOPLASTY    . TONSILLECTOMY     Social History   Social History  . Marital status: Divorced    Spouse name: N/A  . Number of children: 0  . Years of education: N/A   Occupational History  . Office management     [employer]   Social History Main Topics  . Smoking status: Never Smoker  . Smokeless tobacco: Never Used  . Alcohol use Yes  . Drug use: No  . Sexual activity: Not on file   Other Topics Concern  . 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   Review of Systems Anxiety is much better and stress much  better with the job change.  Objective:   Physical Exam '@BP'$  108/60   Pulse 100   Ht '5\' 5"'$  (1.651 m)   Wt 142 lb 2 oz (64.5 kg)   BMI 23.65 kg/m @  General:  NAD Eyes:   anicteric Lungs:  clear Heart::  S1S2 no rubs, murmurs or gallops Abdomen:  soft and nontender, BS+ Ext:   no edema, cyanosis or clubbing    Data Reviewed:   Endocrinology notes. Previous labs.

## 2016-12-30 LAB — HM DIABETES EYE EXAM

## 2017-01-03 ENCOUNTER — Ambulatory Visit: Payer: 59 | Admitting: Dietician

## 2017-01-03 ENCOUNTER — Telehealth: Payer: Self-pay

## 2017-01-03 NOTE — Telephone Encounter (Signed)
-----   Message from Donny Heffern E [place], [place] sent at 12/10/2016  6:31 PM EDT ----- Gorden Harms message to remind her to get labs done end of July.

## 2017-01-03 NOTE — Telephone Encounter (Signed)
Called and spoke to Miranda Cochran and reminded her to come for her lab work.  She plans to come 01/10/17.

## 2017-01-13 ENCOUNTER — Other Ambulatory Visit (INDEPENDENT_AMBULATORY_CARE_PROVIDER_SITE_OTHER): Payer: 59

## 2017-01-13 DIAGNOSIS — K754 Autoimmune hepatitis: Secondary | ICD-10-CM | POA: Diagnosis not present

## 2017-01-13 LAB — CBC WITH DIFFERENTIAL/PLATELET
Basophils Absolute: 0.1 10*3/uL (ref 0.0–0.1)
Basophils Relative: 0.7 % (ref 0.0–3.0)
EOS ABS: 0.1 10*3/uL (ref 0.0–0.7)
Eosinophils Relative: 1.4 % (ref 0.0–5.0)
HEMATOCRIT: 32.8 % — AB (ref 36.0–46.0)
HEMOGLOBIN: 10.6 g/dL — AB (ref 12.0–15.0)
LYMPHS PCT: 26.3 % (ref 12.0–46.0)
Lymphs Abs: 2.3 10*3/uL (ref 0.7–4.0)
MCHC: 32.4 g/dL (ref 30.0–36.0)
MCV: 86.8 fl (ref 78.0–100.0)
Monocytes Absolute: 0.7 10*3/uL (ref 0.1–1.0)
Monocytes Relative: 7.6 % (ref 3.0–12.0)
Neutro Abs: 5.6 10*3/uL (ref 1.4–7.7)
Neutrophils Relative %: 64 % (ref 43.0–77.0)
Platelets: 210 10*3/uL (ref 150.0–400.0)
RBC: 3.78 Mil/uL — ABNORMAL LOW (ref 3.87–5.11)
RDW: 15.3 % (ref 11.5–15.5)
WBC: 8.7 10*3/uL (ref 4.0–10.5)

## 2017-01-13 LAB — HEPATIC FUNCTION PANEL
ALT: 29 U/L (ref 0–35)
AST: 32 U/L (ref 0–37)
Albumin: 3.9 g/dL (ref 3.5–5.2)
Alkaline Phosphatase: 83 U/L (ref 39–117)
BILIRUBIN TOTAL: 0.9 mg/dL (ref 0.2–1.2)
Bilirubin, Direct: 0.1 mg/dL (ref 0.0–0.3)
Total Protein: 7.7 g/dL (ref 6.0–8.3)

## 2017-01-13 NOTE — Progress Notes (Signed)
Liver tests remain normal!  She can try taking 3 mg budesonide daily  Hgb down slightly  Hopefully getting iron supplment in at least qod  Will see her 8/28 at EGD  Let me know if ?

## 2017-02-07 ENCOUNTER — Other Ambulatory Visit: Payer: Self-pay

## 2017-02-07 MED ORDER — GLIMEPIRIDE 2 MG PO TABS: 2.0000 mg | ORAL_TABLET | Freq: Every day | ORAL | 3 refills | Status: DC

## 2017-02-07 MED ORDER — GLIMEPIRIDE 2 MG PO TABS
2.0000 mg | ORAL_TABLET | Freq: Every day | ORAL | 3 refills | Status: DC
Start: 2017-02-07 — End: 2017-06-27

## 2017-02-08 ENCOUNTER — Encounter: Payer: Self-pay | Admitting: Internal Medicine

## 2017-02-08 ENCOUNTER — Ambulatory Visit (AMBULATORY_SURGERY_CENTER): Payer: 59 | Admitting: Internal Medicine

## 2017-02-08 VITALS — BP 145/77 | HR 86 | Temp 98.6°F | Resp 18 | Ht 65.0 in | Wt 142.0 lb

## 2017-02-08 DIAGNOSIS — K2970 Gastritis, unspecified, without bleeding: Secondary | ICD-10-CM | POA: Diagnosis not present

## 2017-02-08 DIAGNOSIS — K3189 Other diseases of stomach and duodenum: Secondary | ICD-10-CM

## 2017-02-08 DIAGNOSIS — K222 Esophageal obstruction: Secondary | ICD-10-CM | POA: Diagnosis not present

## 2017-02-08 DIAGNOSIS — K31819 Angiodysplasia of stomach and duodenum without bleeding: Secondary | ICD-10-CM | POA: Diagnosis not present

## 2017-02-08 DIAGNOSIS — R1310 Dysphagia, unspecified: Secondary | ICD-10-CM

## 2017-02-08 MED ORDER — SODIUM CHLORIDE 0.9 % IV SOLN: 500.0000 mL | INTRAVENOUS | Status: DC

## 2017-02-08 NOTE — Op Note (Signed)
[place] Patient Name: Miranda Cochran Procedure Date: 02/08/2017 3:32 PM MRN: [id number] Endoscopist: Gatha Mayer , MD Age: 66 Referring MD:  Date of Birth: 02/09/1951 Gender: Female Account #: [id number] Procedure:                Upper GI endoscopy Indications:              Iron deficiency anemia, Dysphagia, Heme positive                            stool Medicines:                Propofol per Anesthesia, Monitored Anesthesia Care Procedure:                Pre-Anesthesia Assessment:                           - Prior to the procedure, a History and Physical                            was performed, and patient medications and                            allergies were reviewed. The patient's tolerance of                            previous anesthesia was also reviewed. The risks                            and benefits of the procedure and the sedation                            options and risks were discussed with the patient.                            All questions were answered, and informed consent                            was obtained. Prior Anticoagulants: The patient has                            taken no previous anticoagulant or antiplatelet                            agents. ASA Grade Assessment: III - A patient with                            severe systemic disease. After reviewing the risks                            and benefits, the patient was deemed in                            satisfactory condition to undergo the procedure.  After obtaining informed consent, the endoscope was                            passed under direct vision. Throughout the                            procedure, the patient's blood pressure, pulse, and                            oxygen saturations were monitored continuously. The                            Model GIF-HQ190 [id number]) scope was introduced                            through the mouth, and  advanced to the second part                            of duodenum. The upper GI endoscopy was                            accomplished without difficulty. The patient                            tolerated the procedure well. Scope In: Scope Out: Findings:                 One moderate benign-appearing, intrinsic stenosis                            was found in the proximal esophagus -realized after                            dilation. And was traversed. The scope was                            withdrawn at end of procedure Dilation to 20 cm                            insertion was performed with a Maloney dilator with                            moderate resistance at 54 Fr. The dilation site was                            examined following endoscope reinsertion and showed                            mucoasal tear/dilation effect. Estimated blood loss                            was minimal. some oropharyngeal trauma also - mild.  Multiple diffuse petechiae were found in the                            gastric antrum. Biopsies were taken with a cold                            forceps for histology. Verification of patient                            identification for the specimen was done. Estimated                            blood loss was minimal.                           Diffuse mild mucosal changes characterized by                            snakeskin appearance were found in the gastric                            fundus and in the gastric body.                           The exam was otherwise without abnormality.                           The cardia and gastric fundus were otherwise normal                            on retroflexion. Complications:            No immediate complications. Estimated Blood Loss:     Estimated blood loss was minimal. Impression:               - Benign-appearing esophageal stenosis. Dilated. [place] passed  to 20 cm. Reinspection with                            appropriate mucosal tear proximal esophagus.                           - Gastric petechia(e). Biopsied. ? Gastritis vs                            vascular ectasia - ? portal gastropathy                           - Snakeskin appearance mucosa in the gastric fundus                            and gastric body. ? portal gastropathy                           -  The examination was otherwise normal. Recommendation:           - Patient has a contact number available for                            emergencies. The signs and symptoms of potential                            delayed complications were discussed with the                            patient. Return to normal activities tomorrow.                            Written discharge instructions were provided to the                            patient.                           - Clear liquids x 1 hour then soft foods rest of                            day. Start prior diet tomorrow.                           - Continue present medications.                           - Await pathology results. Gatha Mayer, MD 02/08/2017 3:58:32 PM This report has been signed electronically.

## 2017-02-08 NOTE — Progress Notes (Signed)
Called to room to assist during endoscopic procedure.  Patient ID and intended procedure confirmed with present staff. Received instructions for my participation in the procedure from the performing physician.  

## 2017-02-08 NOTE — Patient Instructions (Addendum)
There were red spots in the stomach - could be leaking blood from those. I took biopsies to understand what these are - ? Inflammation or abnormal blood vessels.  I dilated the esophagus with dilation effect at the top of the esophagus. I would not be surprised if you spit up some blood but if you vomit or spit up large amounts that is not expected. Throat likely to be sore also.  I will let you know pathology results and follow-up plans by phone.  I appreciate the opportunity to care for you. Miranda Mayer, MD, FACG    YOU HAD AN ENDOSCOPIC PROCEDURE TODAY AT [facility]:   Refer to the procedure report that was given to you for any specific questions about what was found during the examination.  If the procedure report does not answer your questions, please call your gastroenterologist to clarify.  If you requested that your care partner not be given the details of your procedure findings, then the procedure report has been included in a sealed envelope for you to review at your convenience later.  YOU SHOULD EXPECT: Some feelings of bloating in the abdomen. Passage of more gas than usual.  Walking can help get rid of the air that was put into your GI tract during the procedure and reduce the bloating. If you had a lower endoscopy (such as a colonoscopy or flexible sigmoidoscopy) you may notice spotting of blood in your stool or on the toilet paper. If you underwent a bowel prep for your procedure, you may not have a normal bowel movement for a few days.  Please Note:  You might notice some irritation and congestion in your nose or some drainage.  This is from the oxygen used during your procedure.  There is no need for concern and it should clear up in a day or so.  SYMPTOMS TO REPORT IMMEDIATELY:   Following lower endoscopy (colonoscopy or flexible sigmoidoscopy):  Excessive amounts of blood in the stool  Significant tenderness or worsening of abdominal  pains  Swelling of the abdomen that is new, acute  Fever of 100F or higher   Following upper endoscopy (EGD)  Vomiting of blood or coffee ground material  New chest pain or pain under the shoulder blades  Painful or persistently difficult swallowing  New shortness of breath  Fever of 100F or higher  Black, tarry-looking stools  For urgent or emergent issues, a gastroenterologist can be reached at any hour by calling [phone].   DIET:  Please follow the post dilation handout. Clear liquids to start at 5:00 then soft diet to start at 6:00pm. Tomorrow  you may proceed to your regular diet.  Drink plenty of fluids but you should avoid alcoholic beverages for 24 hours.  ACTIVITY:  You should plan to take it easy for the rest of today and you should NOT DRIVE or use heavy machinery until tomorrow (because of the sedation medicines used during the test).    FOLLOW UP: Our staff will call the number listed on your records the next business day following your procedure to check on you and address any questions or concerns that you may have regarding the information given to you following your procedure. If we do not reach you, we will leave a message.  However, if you are feeling well and you are not experiencing any problems, there is no need to return our call.  We will assume that you have returned to your  regular daily activities without incident.  If any biopsies were taken you will be contacted by phone or by letter within the next 1-3 weeks.  Please call us at [phone] if you have not heard about the biopsies in 3 weeks.    SIGNATURES/CONFIDENTIALITY: You and/or your care partner have signed paperwork which will be entered into your electronic medical record.  These signatures attest to the fact that that the information above on your After Visit Summary has been reviewed and is understood.  Full responsibility of the confidentiality of this discharge information lies with you  and/or your care-partner.  Thank you for letting us take care of your healthcare needs today.

## 2017-02-08 NOTE — Progress Notes (Signed)
Pt. Reports no change in her medical or surgical history since pre-visit 12/10/2016.

## 2017-02-08 NOTE — Progress Notes (Signed)
A and O x3. Report to RN. Tolerated MAC anesthesia well.Teeth unchanged after procedure.

## 2017-02-09 ENCOUNTER — Telehealth: Payer: Self-pay | Admitting: *Deleted

## 2017-02-09 ENCOUNTER — Ambulatory Visit (HOSPITAL_COMMUNITY)
Admission: RE | Admit: 2017-02-09 | Discharge: 2017-02-09 | Disposition: A | Discharge status: Home / Self Care | Payer: 59 | Source: Ambulatory Visit | Attending: Internal Medicine | Admitting: Internal Medicine

## 2017-02-09 ENCOUNTER — Telehealth: Payer: Self-pay | Admitting: Internal Medicine

## 2017-02-09 DIAGNOSIS — M542 Cervicalgia: Secondary | ICD-10-CM | POA: Diagnosis present

## 2017-02-09 MED ORDER — IOPAMIDOL (ISOVUE-300) INJECTION 61%
150.0000 mL | Freq: Once | INTRAVENOUS | Status: AC | PRN
  Administered 2017-02-09: 150 mL via ORAL

## 2017-02-09 MED ORDER — IOPAMIDOL (ISOVUE-300) INJECTION 61%
INTRAVENOUS | Status: AC
  Filled 2017-02-09: qty 150

## 2017-02-09 NOTE — Telephone Encounter (Signed)
  Follow up Call-  Call back number 02/08/2017 05/23/2014  Post procedure Call Back phone  # [phone]  Permission to leave phone message Yes Yes  Some recent data might be hidden     Patient questions:  Do you have a fever, pain , or abdominal swelling? Yes.   Pain Score  8 *  Have you tolerated food without any problems? Yes.    Have you been able to return to your normal activities? No.  Do you have any questions about your discharge instructions: Diet   No. Medications  No. Follow up visit  No.  Do you have questions or concerns about your Care? No.  Actions: * If pain score is 4 or above:    Pt. Crying when she answered call back.  States she has had sore throat and pain since she  Left yesterday.  States she would rate pain as an "8".  She gargled with salt water x 2 yesterday. She is able to tolerate soft food although she has a lot of pain in her throat.  She states it feels like "there is something in there".  Denies any fever.  No pain anywhere other than her throat.  Please advise

## 2017-02-09 NOTE — Telephone Encounter (Signed)
Please let her know the swallow test is ok  If she wants some pain medication I will Rx liquid hydrocodone for her to take

## 2017-02-09 NOTE — Telephone Encounter (Signed)
Patient notified she will arrive at 10:45 at [facility].  She is asked to be NPO until after the test

## 2017-02-09 NOTE — Telephone Encounter (Signed)
Patient notified of the results She declines the liquid hydrocodone.  She has some pills at home she said she will crush.

## 2017-02-09 NOTE — Telephone Encounter (Signed)
I spoke to patient - having right neck pain - no fever, dyspnea, chest pain  Ate oatmeal today  Feels like something swollen in there neck   My plan:  1) water soluble contrast barium swallow today - after 1000 - she needs some time before she does this 2) Call report with that study 3) If that is ok I will Rx some liquid narcotics 4) If not ok will figure out next steps but would mean possible hospital admit   I will have Sheri order the radiology test

## 2017-02-10 NOTE — Telephone Encounter (Signed)
She is better today Swallowing pills whole  Pain 5/10 down from 8  Reassured.

## 2017-02-11 ENCOUNTER — Other Ambulatory Visit: Payer: Self-pay | Admitting: Endocrinology

## 2017-02-11 DIAGNOSIS — IMO0001 Reserved for inherently not codable concepts without codable children: Secondary | ICD-10-CM

## 2017-02-11 DIAGNOSIS — E1165 Type 2 diabetes mellitus with hyperglycemia: Principal | ICD-10-CM

## 2017-02-15 ENCOUNTER — Encounter: Payer: Self-pay | Admitting: Internal Medicine

## 2017-02-15 DIAGNOSIS — K31819 Angiodysplasia of stomach and duodenum without bleeding: Secondary | ICD-10-CM

## 2017-02-15 HISTORY — DX: Angiodysplasia of stomach and duodenum without bleeding: K31.819

## 2017-02-15 NOTE — Progress Notes (Signed)
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 - if she cannot die those due to side effects we can arrange 2 feraheme injections 4) needs f/u me next available 5) needs LFTs mid Sept re dx autoimmune hepatitis  No letter or recall from [facility]

## 2017-02-16 ENCOUNTER — Other Ambulatory Visit: Payer: Self-pay

## 2017-02-16 DIAGNOSIS — K754 Autoimmune hepatitis: Secondary | ICD-10-CM

## 2017-02-22 ENCOUNTER — Other Ambulatory Visit: Payer: 59

## 2017-02-22 ENCOUNTER — Other Ambulatory Visit (INDEPENDENT_AMBULATORY_CARE_PROVIDER_SITE_OTHER): Payer: 59

## 2017-02-22 DIAGNOSIS — E1165 Type 2 diabetes mellitus with hyperglycemia: Secondary | ICD-10-CM

## 2017-02-22 DIAGNOSIS — K754 Autoimmune hepatitis: Secondary | ICD-10-CM

## 2017-02-22 LAB — HEPATIC FUNCTION PANEL
ALBUMIN: 3.6 g/dL (ref 3.5–5.2)
ALK PHOS: 84 U/L (ref 39–117)
ALT: 38 U/L — ABNORMAL HIGH (ref 0–35)
AST: 43 U/L — ABNORMAL HIGH (ref 0–37)
BILIRUBIN DIRECT: 0.2 mg/dL (ref 0.0–0.3)
Total Bilirubin: 0.5 mg/dL (ref 0.2–1.2)
Total Protein: 7.4 g/dL (ref 6.0–8.3)

## 2017-02-22 LAB — COMPREHENSIVE METABOLIC PANEL
ALBUMIN: 3.6 g/dL (ref 3.5–5.2)
ALK PHOS: 85 U/L (ref 39–117)
ALT: 38 U/L — ABNORMAL HIGH (ref 0–35)
AST: 43 U/L — AB (ref 0–37)
BUN: 9 mg/dL (ref 6–23)
CO2: 28 meq/L (ref 19–32)
Calcium: 9.2 mg/dL (ref 8.4–10.5)
Chloride: 102 mEq/L (ref 96–112)
Creatinine, Ser: 0.57 mg/dL (ref 0.40–1.20)
GFR: 113.2 mL/min (ref >60.00)
GLUCOSE: 129 mg/dL — AB (ref 70–99)
POTASSIUM: 3.6 meq/L (ref 3.5–5.1)
SODIUM: 140 meq/L (ref 135–145)
Total Bilirubin: 0.5 mg/dL (ref 0.2–1.2)
Total Protein: 7.4 g/dL (ref 6.0–8.3)

## 2017-02-22 LAB — LIPID PANEL
CHOL/HDL RATIO: 5
Cholesterol: 246 mg/dL — ABNORMAL HIGH (ref 0–200)
HDL: 50.5 mg/dL (ref >39.00)
LDL Cholesterol: 174 mg/dL — ABNORMAL HIGH (ref 0–99)
NONHDL: 195.18
Triglycerides: 108 mg/dL (ref 0.0–149.0)
VLDL: 21.6 mg/dL (ref 0.0–40.0)

## 2017-02-22 LAB — HEMOGLOBIN A1C: HEMOGLOBIN A1C: 5.9 % (ref 4.6–6.5)

## 2017-02-22 NOTE — Progress Notes (Signed)
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  Let me know if any ?

## 2017-02-25 ENCOUNTER — Ambulatory Visit: Payer: 59 | Admitting: Endocrinology

## 2017-03-01 ENCOUNTER — Other Ambulatory Visit: Payer: Self-pay | Admitting: Internal Medicine

## 2017-03-01 DIAGNOSIS — K754 Autoimmune hepatitis: Secondary | ICD-10-CM

## 2017-03-11 ENCOUNTER — Ambulatory Visit (INDEPENDENT_AMBULATORY_CARE_PROVIDER_SITE_OTHER): Payer: 59 | Admitting: Endocrinology

## 2017-03-11 ENCOUNTER — Encounter: Payer: Self-pay | Admitting: Endocrinology

## 2017-03-11 VITALS — BP 136/86 | HR 88 | Ht 65.0 in | Wt 140.0 lb

## 2017-03-11 DIAGNOSIS — E1165 Type 2 diabetes mellitus with hyperglycemia: Secondary | ICD-10-CM | POA: Diagnosis not present

## 2017-03-11 DIAGNOSIS — E782 Mixed hyperlipidemia: Secondary | ICD-10-CM | POA: Diagnosis not present

## 2017-03-11 MED ORDER — METFORMIN HCL ER 500 MG PO TB24: 1500.0000 mg | ORAL_TABLET | Freq: Every day | ORAL | 3 refills | Status: DC

## 2017-03-11 NOTE — Progress Notes (Signed)
[name], female   DOB: 01/28/1951, 66 y.o.   MRN: [id number]          Reason for Appointment: Follow-up for Type 2 Diabetes    History of Present Illness:          Date of diagnosis of type 2 diabetes mellitus: ?  2013        Background history:   She had a blood sugar over 200 in 2013 but was not told to have diabetes until 04/2015 A1c was 7.3 in 3/16 In 04/2015 blood sugar was 478 says her blood sugar was 600 when she was diagnosed and was not having any significant symptoms Initially treated with Metaglip but this apparently caused abdominal discomfort and she was switched to metformin In 2017 she had been tried on Bydureon for better control but she stopped this after a few weeks because of large persisted nodules in her skin She thinks her blood sugars were better with this and A1c was down to 6.8  Recent history:   Non-insulin hypoglycemic drugs the patient is taking are: Trulicity 1.5 mg weekly and metformin 500 mg twice a day, Amaryl 2  Her A1c is now only 5.9, previously 6.3, has been as high as 8.3 last December  Current management, blood sugar patterns and problems identified:  Her blood sugars are again high nonfasting but to a different extent and has only a few readings in the afternoons are late evening with variable results  She is however checking only about every other day  She still has difficulty controlling intake of carbohydrates, snacks and sometimes sweets causing blood sugars to be significantly high  Also not always avoiding high-fat foods which will raise her blood sugars  Despite taking Amaryl since about 5/18 she still has hyperglycemia after some meals although lowest blood sugar 115 at suppertime  She does have relatively good fasting readings with only one high reading this morning  She is on Entocort for her hepatitis which she takes in the mornings  She says she is trying to walk sometimes but no programmed exercise    Side effects from medications have been: Metaglip caused abdominal pain, Tradjenta caused swelling of the legs, Bydureon caused skin nodules, Farxiga caused diarrhea  Compliance with the medical regimen: Inconsistent  Glucose monitoring:  done less than 1 times a day         Glucometer: One Touch.      Blood Glucose readings by time of day    Mean values apply above for all meters except median for One Touch  PRE-MEAL Fasting Lunch Dinner Bedtime Overall  Glucose range: 112-175  1 45-257  115     Mean/median: 136     168 +/-96    POST-MEAL PC Breakfast PC Lunch PC Dinner  Glucose range:  300, 411  370   Mean/median:      Mean values apply above for all meters except median for One Touch  PRE-MEAL Fasting Lunch Dinner Bedtime Overall  Glucose range: 103-161  1 13-238  166     Mean/median: 141     145    POST-MEAL PC Breakfast PC Lunch PC Dinner  Glucose range:  241    Mean/median:        Self-care:  Typical meal intake: Breakfast is Oatmeal, usually eating low-fat dinner and her snacks will be fruit, peanut butter crackers                Dietician visit,  most recent:None               Exercise: none  Weight history:  Wt Readings from Last 3 Encounters:  03/11/17 140 lb (63.5 kg)  02/08/17 142 lb (64.4 kg)  12/10/16 142 lb 2 oz (64.5 kg)    Glycemic control:   Lab Results  Component Value Date   HGBA1C 5.9 02/22/2017   HGBA1C 6.3 11/19/2016   HGBA1C 7.0 09/14/2016   Lab Results  Component Value Date   MICROALBUR 10.5 (H) 05/24/2016   LDLCALC 174 (H) 02/22/2017   CREATININE 0.57 02/22/2017   Lab Results  Component Value Date   MICRALBCREAT 7.8 05/24/2016    Lab Results  Component Value Date   FRUCTOSAMINE 352 (H) 10/08/2016      Allergies as of 03/11/2017      Reactions   Fentanyl Anxiety   Made her feel very anxious, requests not to get it.   Levofloxacin    hallucinations   Prednisone    Anxiety and paranoia   Amoxicillin Other (See  Comments)   diarrhea   Augmentin [amoxicillin-pot Clavulanate] Other (See Comments)   diarrhea   Farxiga [dapagliflozin] Diarrhea   Rosuvastatin Calcium    Severe cramping Not able to sleep   Simvastatin    myalgia      Medication List       Accurate as of 03/11/17 11:59 PM. Always use your most recent med list.          ALPRAZolam 0.5 MG tablet Commonly known as:  XANAX Take 1 tablet (0.5 mg total) by mouth at bedtime as needed for anxiety.   aspirin 325 MG tablet Take 325 mg by mouth daily as needed.   azelastine 0.05 % ophthalmic solution Commonly known as:  OPTIVAR instill 1 drop into affected eye DAILY   blood glucose meter kit and supplies Kit Dispense based on patient and insurance preference. Use up to four times daily as directed. (FOR ICD-9 250.00, 250.01).   budesonide 3 MG 24 hr capsule Commonly known as:  ENTOCORT EC Take 2 capsules (6 mg total) by mouth daily.   cyclobenzaprine 10 MG tablet Commonly known as:  FLEXERIL take 1 tablet by mouth at bedtime if needed   diphenhydrAMINE 25 mg capsule Commonly known as:  BENADRYL Take 25 mg by mouth.   Dulaglutide 1.5 MG/0.5ML Sopn Commonly known as:  TRULICITY Inject Weekly   EFFEXOR XR 75 MG 24 hr capsule Generic drug:  venlafaxine XR take 1 capsule by mouth twice a day WITH A MEAL   ferrous sulfate 325 (65 FE) MG tablet Take 1 tablet (325 mg total) by mouth daily with breakfast.   Fluticasone-Salmeterol 100-50 MCG/DOSE Aepb Commonly known as:  ADVAIR DISKUS Inhale 1 puff into the lungs 2 (two) times daily.   furosemide 20 MG tablet Commonly known as:  LASIX Take 1 tablet (20 mg total) by mouth daily as needed.   glimepiride 2 MG tablet Commonly known as:  AMARYL Take 1 tablet (2 mg total) by mouth daily before breakfast.   glucose blood test strip Use as instructed   lansoprazole 30 MG capsule Commonly known as:  PREVACID Take 1 capsule (30 mg total) by mouth daily.   metFORMIN 500  MG 24 hr tablet Commonly known as:  GLUCOPHAGE-XR Take 3 tablets (1,500 mg total) by mouth daily with supper.   montelukast 10 MG tablet Commonly known as:  SINGULAIR TAKE 1 TABLET BY MOUTH AT BEDTIME.   [facility] DELICA  LANCETS 33G Misc USE UP TO QIF AS DIRECTED   oxyCODONE-acetaminophen 5-325 MG tablet Commonly known as:  PERCOCET/ROXICET Take 1 tablet by mouth every 8 (eight) hours as needed.   promethazine 12.5 MG tablet Commonly known as:  PHENERGAN Take 1 tablet (12.5 mg total) by mouth every 8 (eight) hours as needed for nausea or vomiting.   pseudoephedrine 30 MG tablet Commonly known as:  SUDAFED Take 30 mg by mouth every 4 (four) hours as needed for congestion.            Discharge Care Instructions        Start     Ordered   03/11/17 0000  metFORMIN (GLUCOPHAGE-XR) 500 MG 24 hr tablet  Daily with supper     03/11/17 1001      Allergies:  Allergies  Allergen Reactions  . Fentanyl Anxiety    Made her feel very anxious, requests not to get it.  . Levofloxacin     hallucinations  . Prednisone     Anxiety and paranoia  . Amoxicillin Other (See Comments)    diarrhea  . Augmentin [Amoxicillin-Pot Clavulanate] Other (See Comments)    diarrhea  . Farxiga [Dapagliflozin] Diarrhea  . Rosuvastatin Calcium     Severe cramping Not able to sleep  . Simvastatin     myalgia    Past Medical History:  Diagnosis Date  . Allergy   . Anxiety   . Asthma   . Autoimmune hepatitis ([place]) 08/18/2016   08/2016 liver bx confirms  . Depression   . DM (diabetes mellitus) ([place])   . 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 GERD (gastroesophageal reflux disease)   . Hepatic cirrhosis ([place])   . Hiatal hernia   . Hx of adenomatous polyp of colon 07/07/2009  . Hyperlipidemia   . Hypertension   . IBS (irritable bowel syndrome)   . Iron deficiency anemia   . Sinusitis, chronic   . Thyroid nodule   . Umbilical hernia   . Uterine fibroid   . Vascular disease     . Vitamin D deficiency     Past Surgical History:  Procedure Laterality Date  . CATARACT EXTRACTION Bilateral   . COLONOSCOPY  11/1991, 06/2009  . ESOPHAGOGASTRODUODENOSCOPY  02/2002  . EYE SURGERY    . NASAL SEPTOPLASTY W/ TURBINOPLASTY    . TONSILLECTOMY      Family History  Problem Relation Age of Onset  . Stroke Mother   . Hypertension Mother   . Colon cancer Father   . Liver cancer Father   . Heart disease Sister        hole in heart  . Stroke Maternal Grandmother   . Hypertension Maternal Grandmother   . Esophageal cancer Maternal Grandfather   . Breast cancer Paternal Grandmother   . Brain cancer Paternal Grandmother   . Lung cancer Paternal Grandfather   . Diabetes Neg Hx     Social History:  reports that she has never smoked. She has never used smokeless tobacco. She reports that she drinks alcohol. She reports that she does not use drugs.   Review of Systems   Lipid history: Previously had tried Zocor which apparently caused muscle aches, she took Crestor Again recently with a Generic and she thinks the generic causes muscle cramps and she   She took Livalo for a couple of days but she thinks she didn't feel well with this and had some palpitations and does not want to take it again  Currently on no treatment    Lab Results  Component Value Date   CHOL 246 (H) 02/22/2017   HDL 50.50 02/22/2017   LDLCALC 174 (H) 02/22/2017   TRIG 108.0 02/22/2017   CHOLHDL 5 02/22/2017           Lab Results  Component Value Date   ALT 38 (H) 02/22/2017    Hypertension: None, she is taking Maxzide because of tendency to swelling in her abdomen Blood pressure is Usually high   BP Readings from Last 3 Encounters:  03/11/17 136/86  02/08/17 (!) 145/77  12/10/16 108/60     Most recent eye exam was 7/17 She has evidence of neuropathy on exam  Most recent foot exam:09/2016    LABS:  No visits with results within 1 Week(s) from this visit.  Latest known visit  with results is:  Lab on 02/22/2017  Component Date Value Ref Range Status  . Hgb A1c MFr Bld 02/22/2017 5.9  4.6 - 6.5 % Final   Glycemic Control Guidelines for People with Diabetes:Non Diabetic:  <6%Goal of Therapy: <7%Additional Action Suggested:  >8%   . Sodium 02/22/2017 140  135 - 145 mEq/L Final  . Potassium 02/22/2017 3.6  3.5 - 5.1 mEq/L Final  . Chloride 02/22/2017 102  96 - 112 mEq/L Final  . CO2 02/22/2017 28  19 - 32 mEq/L Final  . Glucose, Bld 02/22/2017 129* 70 - 99 mg/dL Final  . BUN 02/22/2017 9  6 - 23 mg/dL Final  . Creatinine, Ser 02/22/2017 0.57  0.40 - 1.20 mg/dL Final  . Total Bilirubin 02/22/2017 0.5  0.2 - 1.2 mg/dL Final  . Alkaline Phosphatase 02/22/2017 85  39 - 117 U/L Final  . AST 02/22/2017 43* 0 - 37 U/L Final  . ALT 02/22/2017 38* 0 - 35 U/L Final  . Total Protein 02/22/2017 7.4  6.0 - 8.3 g/dL Final  . Albumin 02/22/2017 3.6  3.5 - 5.2 g/dL Final  . Calcium 02/22/2017 9.2  8.4 - 10.5 mg/dL Final  . GFR 02/22/2017 113.20  >60.00 mL/min Final  . Cholesterol 02/22/2017 246* 0 - 200 mg/dL Final   ATP III Classification       Desirable:  < 200 mg/dL               Borderline High:  200 - 239 mg/dL          High:  > = 240 mg/dL  . Triglycerides 02/22/2017 108.0  0.0 - 149.0 mg/dL Final   Normal:  <150 mg/dLBorderline High:  150 - 199 mg/dL  . HDL 02/22/2017 50.50  >39.00 mg/dL Final  . VLDL 02/22/2017 21.6  0.0 - 40.0 mg/dL Final  . LDL Cholesterol 02/22/2017 174* 0 - 99 mg/dL Final  . Total CHOL/HDL Ratio 02/22/2017 5   Final                  Men          Women1/2 Average Risk     3.4          3.3Average Risk          5.0          4.42X Average Risk          9.6          7.13X Average Risk          15.0          11.0                      .  NonHDL 02/22/2017 195.18   Final   NOTE:  Non-HDL goal should be 30 mg/dL higher than patient's LDL goal (i.e. LDL goal of < 70 mg/dL, would have non-HDL goal of < 100 mg/dL)  . Total Bilirubin 02/22/2017 0.5  0.2 - 1.2  mg/dL Final  . Bilirubin, Direct 02/22/2017 0.2  0.0 - 0.3 mg/dL Final  . Alkaline Phosphatase 02/22/2017 84  39 - 117 U/L Final  . AST 02/22/2017 43* 0 - 37 U/L Final  . ALT 02/22/2017 38* 0 - 35 U/L Final  . Total Protein 02/22/2017 7.4  6.0 - 8.3 g/dL Final  . Albumin 02/22/2017 3.6  3.5 - 5.2 g/dL Final    Physical Examination:  BP 136/86   Pulse 88   Ht 5' 5" (1.651 m)   Wt 140 lb (63.5 kg)   SpO2 98%   BMI 23.30 kg/m          ASSESSMENT:  Diabetes type 2, uncontrolled with normal BMI of 23     See history of present illness for detailed discussion of current diabetes management, blood sugar patterns and problems identified  A1c 5.9 but lower than expected for her sugars at home which are averaging over 200  Currently she is on 1.5 mg Trulicity, Amaryl and  low-dose metformin somewhat from her hyperglycemia related to taking Entocort She does need to improve her diet and cut back on high-fat and high carbohydrate foods but she still has not been able to get her appointment scheduled with dietitian She can do better with exercise also  HYPERLIPIDEMIA: She reports intolerances to various statins including unusual side effects with Livalo and will hold off on treatment while she is having management of her liver functions also Discussed possibly trying Livalo once more as her side effects were atypical and may not be related to medication Discussed LDL target of 100 and current levels   PLAN:    More balanced meals, cutting back on carbohydrates would be important  Although Ozempic would likely work better than Trulicity for her postprandial readings this is currently not covered, consider doing another prescription with PA on the next visit  More postprandial monitoring especially after lunch and dinner  Start regular walking or start going to the gym at her apartment complex  She can try taking additional metformin 500 mg at lunch and if not tolerated try extended  release, prescription sent for 1500 mg a day since she reports some abdominal discomfort with taking to metformin's together and also this may help her commit or nausea  May also be a candidate for Cycloset but currently still having some intermittent nausea  Schedule appointment with dietitian  Also check fructosamine on next visit to confirm accuracy of her A1c, discussed A1c level and correlation with blood sugars     Patient Instructions  Check blood sugars on waking up  2-3/7  Also check blood sugars about 2 hours after a meal and do this after different meals by rotation  Recommended blood sugar levels on waking up is 90-130 and about 2 hours after meal is 130-160  Please bring your blood sugar monitor to each visit, thank you  Start exercise daily  Try Metformin at lunch also  Counseling time on subjects discussed above is over 50% of today's 25 minute visit    , 03/12/2017, 2:36 PM   Note: This office note was prepared with Dragon voice recognition system technology. Any transcriptional errors that result from this process are unintentional.

## 2017-03-11 NOTE — Patient Instructions (Addendum)
Check blood sugars on waking up  2-3/7  Also check blood sugars about 2 hours after a meal and do this after different meals by rotation  Recommended blood sugar levels on waking up is 90-130 and about 2 hours after meal is 130-160  Please bring your blood sugar monitor to each visit, thank you  Start exercise daily  Try Metformin at lunch also

## 2017-03-15 ENCOUNTER — Other Ambulatory Visit: Payer: Self-pay | Admitting: Family Medicine

## 2017-03-15 ENCOUNTER — Encounter: Payer: Self-pay | Admitting: Family Medicine

## 2017-03-15 ENCOUNTER — Ambulatory Visit (INDEPENDENT_AMBULATORY_CARE_PROVIDER_SITE_OTHER): Payer: 59 | Admitting: Family Medicine

## 2017-03-15 VITALS — Temp 98.4°F | Ht 65.0 in | Wt 139.0 lb

## 2017-03-15 DIAGNOSIS — J018 Other acute sinusitis: Secondary | ICD-10-CM | POA: Diagnosis not present

## 2017-03-15 DIAGNOSIS — Z1231 Encounter for screening mammogram for malignant neoplasm of breast: Secondary | ICD-10-CM

## 2017-03-15 DIAGNOSIS — M545 Low back pain, unspecified: Secondary | ICD-10-CM

## 2017-03-15 MED ORDER — AZITHROMYCIN 250 MG PO TABS: ORAL_TABLET | ORAL | 0 refills | Status: DC

## 2017-03-15 MED ORDER — OXYCODONE-ACETAMINOPHEN 5-325 MG PO TABS: 1.0000 | ORAL_TABLET | Freq: Three times a day (TID) | ORAL | 0 refills | Status: DC | PRN

## 2017-03-15 NOTE — Patient Instructions (Signed)
WE NOW OFFER   Miranda [facility] FAST TRACK!!!  SAME DAY Appointments for ACUTE CARE  Such as: Sprains, Injuries, cuts, abrasions, rashes, muscle pain, joint pain, back pain Colds, flu, sore throats, headache, allergies, cough, fever  Ear pain, sinus and eye infections Abdominal pain, nausea, vomiting, diarrhea, upset stomach Animal/insect bites  3 Easy Ways to Schedule: Walk-In Scheduling Call in scheduling Mychart Sign-up: [email or web address]         

## 2017-03-15 NOTE — Progress Notes (Signed)
   Subjective:    [name], female    DOB: [birth year], 66 y.o.   MRN: [id number]  HPI Here for one week of sinus pressure, PND, blowing yellow mucus from the nose, and and dry cough. Using Zyrtec.    Review of Systems  Constitutional: Negative.   HENT: Positive for congestion, postnasal drip, sinus pain and sinus pressure. Negative for sore throat.   Eyes: Negative.   Respiratory: Positive for cough.        Objective:   Physical Exam  Constitutional: She appears well-developed and well-nourished.  HENT:  Right Ear: External ear normal.  Left Ear: External ear normal.  Nose: Nose normal.  Mouth/Throat: Oropharynx is clear and moist.  Eyes: Conjunctivae are normal.  Neck: No thyromegaly present.  Pulmonary/Chest: Effort normal and breath sounds normal. No respiratory distress. She has no wheezes. She has no rales.  Lymphadenopathy:    She has no cervical adenopathy.          Assessment & Plan:  Sinusitis, treat with a Zpack. Add Mucinex prn. Refilled her Percocet, which she fulls about twice a year.  Alysia Penna, MD

## 2017-03-20 ENCOUNTER — Other Ambulatory Visit: Payer: Self-pay | Admitting: Endocrinology

## 2017-04-06 ENCOUNTER — Ambulatory Visit
Admission: RE | Admit: 2017-04-06 | Discharge: 2017-04-06 | Disposition: A | Discharge status: Home / Self Care | Payer: 59 | Source: Ambulatory Visit | Attending: Family Medicine | Admitting: Family Medicine

## 2017-04-06 DIAGNOSIS — Z1231 Encounter for screening mammogram for malignant neoplasm of breast: Secondary | ICD-10-CM

## 2017-04-12 ENCOUNTER — Other Ambulatory Visit: Payer: Self-pay | Admitting: Internal Medicine

## 2017-04-22 ENCOUNTER — Other Ambulatory Visit (INDEPENDENT_AMBULATORY_CARE_PROVIDER_SITE_OTHER): Payer: 59

## 2017-04-22 DIAGNOSIS — E876 Hypokalemia: Secondary | ICD-10-CM | POA: Diagnosis not present

## 2017-04-22 DIAGNOSIS — K754 Autoimmune hepatitis: Secondary | ICD-10-CM | POA: Diagnosis not present

## 2017-04-22 LAB — COMPREHENSIVE METABOLIC PANEL
ALBUMIN: 3.8 g/dL (ref 3.5–5.2)
ALT: 38 U/L — AB (ref 0–35)
AST: 38 U/L — AB (ref 0–37)
Alkaline Phosphatase: 92 U/L (ref 39–117)
BILIRUBIN TOTAL: 0.6 mg/dL (ref 0.2–1.2)
BUN: 14 mg/dL (ref 6–23)
CALCIUM: 9.3 mg/dL (ref 8.4–10.5)
CHLORIDE: 101 meq/L (ref 96–112)
CO2: 29 meq/L (ref 19–32)
CREATININE: 0.62 mg/dL (ref 0.40–1.20)
GFR: 102.68 mL/min (ref >60.00)
Glucose, Bld: 115 mg/dL — ABNORMAL HIGH (ref 70–99)
Potassium: 4.2 mEq/L (ref 3.5–5.1)
Sodium: 138 mEq/L (ref 135–145)
Total Protein: 7.9 g/dL (ref 6.0–8.3)

## 2017-04-22 LAB — CBC WITH DIFFERENTIAL/PLATELET
BASOS ABS: 0 10*3/uL (ref 0.0–0.1)
Basophils Relative: 0.4 % (ref 0.0–3.0)
EOS ABS: 0.3 10*3/uL (ref 0.0–0.7)
Eosinophils Relative: 2.6 % (ref 0.0–5.0)
HEMATOCRIT: 35.2 % — AB (ref 36.0–46.0)
HEMOGLOBIN: 11.2 g/dL — AB (ref 12.0–15.0)
LYMPHS PCT: 35.2 % (ref 12.0–46.0)
Lymphs Abs: 3.6 10*3/uL (ref 0.7–4.0)
MCHC: 31.8 g/dL (ref 30.0–36.0)
MCV: 86.9 fl (ref 78.0–100.0)
MONOS PCT: 8.8 % (ref 3.0–12.0)
Monocytes Absolute: 0.9 10*3/uL (ref 0.1–1.0)
Neutro Abs: 5.3 10*3/uL (ref 1.4–7.7)
Neutrophils Relative %: 53 % (ref 43.0–77.0)
Platelets: 195 10*3/uL (ref 150.0–400.0)
RBC: 4.06 Mil/uL (ref 3.87–5.11)
RDW: 16.3 % — ABNORMAL HIGH (ref 11.5–15.5)
WBC: 10.1 10*3/uL (ref 4.0–10.5)

## 2017-04-26 ENCOUNTER — Other Ambulatory Visit: Payer: Self-pay

## 2017-04-26 ENCOUNTER — Ambulatory Visit: Payer: 59 | Admitting: Internal Medicine

## 2017-04-26 ENCOUNTER — Encounter: Payer: Self-pay | Admitting: Internal Medicine

## 2017-04-26 ENCOUNTER — Other Ambulatory Visit: Payer: Self-pay | Admitting: Internal Medicine

## 2017-04-26 VITALS — BP 132/72 | HR 68 | Ht 66.0 in | Wt 141.2 lb

## 2017-04-26 DIAGNOSIS — K754 Autoimmune hepatitis: Secondary | ICD-10-CM

## 2017-04-26 DIAGNOSIS — D508 Other iron deficiency anemias: Secondary | ICD-10-CM

## 2017-04-26 DIAGNOSIS — D500 Iron deficiency anemia secondary to blood loss (chronic): Secondary | ICD-10-CM

## 2017-04-26 DIAGNOSIS — K31819 Angiodysplasia of stomach and duodenum without bleeding: Secondary | ICD-10-CM | POA: Diagnosis not present

## 2017-04-26 DIAGNOSIS — K219 Gastro-esophageal reflux disease without esophagitis: Secondary | ICD-10-CM

## 2017-04-26 MED ORDER — BUDESONIDE 3 MG PO CPEP: 9.0000 mg | ORAL_CAPSULE | Freq: Every day | ORAL | 3 refills | Status: DC

## 2017-04-26 NOTE — Patient Instructions (Signed)
You have been scheduled for an endoscopy. Please follow written instructions given to you at your visit today. If you use inhalers (even only as needed), please bring them with you on the day of your procedure.   We are going to set you up for iron infusions and a month after your 2nd infusion come and get labs drawn. No appointment needed.   Dr Carlean Purl has increased your Budesonide dosage.    We are giving you copies of your lab work to take with you.    I appreciate the opportunity to care for you. Miranda Rusk, MD, [facility]

## 2017-04-26 NOTE — Assessment & Plan Note (Signed)
Try RFA

## 2017-04-26 NOTE — Assessment & Plan Note (Signed)
Increase to 9 mg budesonide

## 2017-04-26 NOTE — Progress Notes (Signed)
 Miranda Cochran 66 y.o. 07/06/1950 [id number]  Assessment & Plan:  Autoimmune hepatitis (HCC) Increase to 9 mg budesonide  GAVE (gastric antral vascular ectasia) Try RFA  Iron deficiency anemia I think this is related to gave, planned radiofrequency ablation and iron supplementation.    Overall [name] is stable LFTs are a little bit abnormal we will increase the budesonide.  She has an anemia and iron deficiency and I think it is from gastric antral vascular ectasia.  We will try radiofrequency ablation of that and also give her 2 infusions of Feraheme.  Further plans pending that.  Will recheck labs one month after the Feraheme infusion.  I once again reviewed azathioprine treatment and why we want to try to avoid steroid use she is not ready to use that medication.  Subjective:   Chief Complaint: Follow-up of autoimmune hepatitis feeling well  HPI Miranda Cochran is here today for follow-up, she reports that she just celebrated her 66th birthday.  She has autoimmune hepatitis and endoscopy a few months ago found gastric antral vascular ectasia.  She has had an iron deficiency anemia that I think is related to that.  She has been on iron supplementation and her hemoglobin is rising some her transaminases are still mildly abnormal.  They went up after normalizing on 9 mg budesonide, when we went to 3 mg of budesonide.  She still does not want to take different medication like azathioprine.  Lab Results  Component Value Date   WBC 10.1 04/22/2017   HGB 11.2 (L) 04/22/2017   HCT 35.2 (L) 04/22/2017   MCV 86.9 04/22/2017   PLT 195.0 04/22/2017   Lab Results  Component Value Date   FERRITIN 9.4 (L) 07/30/2016   Lab Results  Component Value Date   ALT 38 (H) 04/22/2017   AST 38 (H) 04/22/2017   ALKPHOS 92 04/22/2017   BILITOT 0.6 04/22/2017    Allergies  Allergen Reactions  . Fentanyl Anxiety    Made her feel very anxious, requests not to get it.  . Levofloxacin    hallucinations  . Prednisone     Anxiety and paranoia  . Amoxicillin Other (See Comments)    diarrhea  . Augmentin [Amoxicillin-Pot Clavulanate] Other (See Comments)    diarrhea  . Farxiga [Dapagliflozin] Diarrhea  . Rosuvastatin Calcium     Severe cramping Not able to sleep  . Simvastatin     myalgia   Current Meds  Medication Sig  . ALPRAZolam (XANAX) 0.5 MG tablet Take 1 tablet (0.5 mg total) by mouth at bedtime as needed for anxiety.  . aspirin 325 MG tablet Take 325 mg by mouth daily as needed.   . azelastine (OPTIVAR) 0.05 % ophthalmic solution instill 1 drop into affected eye DAILY  . blood glucose meter kit and supplies KIT Dispense based on patient and insurance preference. Use up to four times daily as directed. (FOR ICD-9 250.00, 250.01).  . budesonide (ENTOCORT EC) 3 MG 24 hr capsule Take 3 capsules (9 mg total) daily by mouth.  . cyclobenzaprine (FLEXERIL) 10 MG tablet take 1 tablet by mouth at bedtime if needed  . diphenhydrAMINE (BENADRYL) 25 mg capsule Take 25 mg by mouth.  . EFFEXOR XR 75 MG 24 hr capsule take 1 capsule by mouth twice a day WITH A MEAL  . ferrous sulfate 325 (65 FE) MG tablet Take 1 tablet (325 mg total) by mouth daily with breakfast.  . Fluticasone-Salmeterol (ADVAIR DISKUS) 100-50 MCG/DOSE AEPB Inhale 1 puff into   the lungs 2 (two) times daily.  . furosemide (LASIX) 20 MG tablet Take 1 tablet (20 mg total) by mouth daily as needed.  . glimepiride (AMARYL) 2 MG tablet Take 1 tablet (2 mg total) by mouth daily before breakfast.  . glucose blood test strip Use as instructed  . metFORMIN (GLUCOPHAGE-XR) 500 MG 24 hr tablet Take 3 tablets (1,500 mg total) by mouth daily with supper.  . montelukast (SINGULAIR) 10 MG tablet TAKE 1 TABLET BY MOUTH AT BEDTIME.  . ONETOUCH DELICA LANCETS 33G MISC USE UP TO QIF AS DIRECTED  . oxyCODONE-acetaminophen (PERCOCET/ROXICET) 5-325 MG tablet Take 1 tablet by mouth every 8 (eight) hours as needed.  . promethazine  (PHENERGAN) 12.5 MG tablet Take 1 tablet (12.5 mg total) by mouth every 8 (eight) hours as needed for nausea or vomiting.  . pseudoephedrine (SUDAFED) 30 MG tablet Take 30 mg by mouth every 4 (four) hours as needed for congestion.  . TRULICITY 1.5 MG/0.5ML SOPN INJECT ONCE WEEKLY AS DIRECTED  . [DISCONTINUED] budesonide (ENTOCORT EC) 3 MG 24 hr capsule Take 2 capsules (6 mg total) by mouth daily.  . [DISCONTINUED] lansoprazole (PREVACID) 30 MG capsule Take 1 capsule (30 mg total) by mouth daily.   Past Medical History:  Diagnosis Date  . Allergy   . Anxiety   . Asthma   . Autoimmune hepatitis (HCC) 08/18/2016   08/2016 liver bx confirms  . Depression   . DM (diabetes mellitus) ([facility])   . 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 GERD (gastroesophageal reflux disease)   . Hepatic cirrhosis ([facility])   . Hiatal hernia   . Hx of adenomatous polyp of colon 07/07/2009  . Hyperlipidemia   . Hypertension   . IBS (irritable bowel syndrome)   . Iron deficiency anemia   . Sinusitis, chronic   . Thyroid nodule   . Umbilical hernia   . Uterine fibroid   . Vascular disease   . Vitamin D deficiency    Past Surgical History:  Procedure Laterality Date  . CATARACT EXTRACTION Bilateral   . COLONOSCOPY  11/1991, 06/2009  . ESOPHAGOGASTRODUODENOSCOPY  02/2002  . EYE SURGERY    . NASAL SEPTOPLASTY W/ TURBINOPLASTY    . TONSILLECTOMY     Social History   Socioeconomic History  . Marital status: Divorced    Spouse name: Not on file  . Number of children: 0  . Years of education: Not on file  . Highest education level: Not on file  Social Needs  . Financial resource strain: Not on file  . Food insecurity - worry: Not on file  . Food insecurity - inability: Not on file  . Transportation needs - medical: Not on file  . Transportation needs - non-medical: Not on file  Occupational History  . Occupation: Office management    Comment: [employer]  Tobacco Use  . Smoking status:  Never Smoker  . Smokeless tobacco: Never Used  Substance and Sexual Activity  . Alcohol use: Yes  . Drug use: No  . Sexual activity: Not on file  Other Topics Concern  . 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   Review of Systems As per HPI  Objective:   Physical Exam @BP 132/72   Pulse 68   Ht 5' 6" (1.676 m)   Wt 141 lb 4 oz (64.1 kg)   BMI 22.80 kg/m @  General:  NAD Eyes:   anicteric Lungs:  clear Heart::  S1S2 no rubs, murmurs or gallops Abdomen:  soft and nontender, BS+ Ext:   no edema, cyanosis or clubbing    Data Reviewed:   As per HPI  
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
## 2017-04-27 ENCOUNTER — Telehealth: Payer: Self-pay

## 2017-04-27 NOTE — Telephone Encounter (Signed)
Left a detailed voicemail message for patient and then she called back and she is still questioning the timing.  [name] I would double check with Dr Carlean Purl tomorrow and be in touch.

## 2017-04-27 NOTE — Telephone Encounter (Signed)
Spoke with Miranda Cochran to make sure she got the message about her feraheme infusion appointments.  They are 05/25/17 and 06/01/17 at [facility].  Her EGD is 05/18/17.  She said she thought you wanted the infusions prior to her procedure, please elucidate?  Thank you.

## 2017-04-27 NOTE — Telephone Encounter (Signed)
Getting the Feraheme and EGD have no bearing upon each other. Maybe I led her to believe the feraheme should come first but if so that was a mistake on my part. She needs both the feraheme and the EGD - order of these not important.

## 2017-04-27 NOTE — Telephone Encounter (Signed)
That schedule is fine

## 2017-04-28 NOTE — Telephone Encounter (Signed)
Left detailed message with Dr Celesta Aver advice and told her to call back with questions.

## 2017-05-04 ENCOUNTER — Encounter (HOSPITAL_COMMUNITY): Payer: Self-pay | Admitting: Emergency Medicine

## 2017-05-04 ENCOUNTER — Other Ambulatory Visit: Payer: Self-pay

## 2017-05-16 ENCOUNTER — Encounter (HOSPITAL_COMMUNITY): Payer: Self-pay

## 2017-05-16 ENCOUNTER — Emergency Department (HOSPITAL_COMMUNITY)
Admission: EM | Admit: 2017-05-16 | Discharge: 2017-05-16 | Disposition: A | Discharge status: Home / Self Care | Payer: 59 | Attending: Emergency Medicine | Admitting: Emergency Medicine

## 2017-05-16 ENCOUNTER — Emergency Department (HOSPITAL_COMMUNITY): Payer: 59

## 2017-05-16 ENCOUNTER — Other Ambulatory Visit: Payer: Self-pay

## 2017-05-16 DIAGNOSIS — Y92410 Unspecified street and highway as the place of occurrence of the external cause: Secondary | ICD-10-CM | POA: Diagnosis not present

## 2017-05-16 DIAGNOSIS — S29001A Unspecified injury of muscle and tendon of front wall of thorax, initial encounter: Secondary | ICD-10-CM | POA: Diagnosis present

## 2017-05-16 DIAGNOSIS — S20219A Contusion of unspecified front wall of thorax, initial encounter: Secondary | ICD-10-CM

## 2017-05-16 DIAGNOSIS — S81831A Puncture wound without foreign body, right lower leg, initial encounter: Secondary | ICD-10-CM | POA: Diagnosis not present

## 2017-05-16 DIAGNOSIS — I10 Essential (primary) hypertension: Secondary | ICD-10-CM | POA: Insufficient documentation

## 2017-05-16 DIAGNOSIS — Z79899 Other long term (current) drug therapy: Secondary | ICD-10-CM | POA: Insufficient documentation

## 2017-05-16 DIAGNOSIS — Z7984 Long term (current) use of oral hypoglycemic drugs: Secondary | ICD-10-CM | POA: Insufficient documentation

## 2017-05-16 DIAGNOSIS — Y999 Unspecified external cause status: Secondary | ICD-10-CM | POA: Insufficient documentation

## 2017-05-16 DIAGNOSIS — E119 Type 2 diabetes mellitus without complications: Secondary | ICD-10-CM | POA: Diagnosis not present

## 2017-05-16 DIAGNOSIS — Y939 Activity, unspecified: Secondary | ICD-10-CM | POA: Insufficient documentation

## 2017-05-16 DIAGNOSIS — S20211A Contusion of right front wall of thorax, initial encounter: Secondary | ICD-10-CM | POA: Insufficient documentation

## 2017-05-16 DIAGNOSIS — J45909 Unspecified asthma, uncomplicated: Secondary | ICD-10-CM | POA: Insufficient documentation
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
## 2017-05-16 NOTE — ED Notes (Signed)
ED Provider at bedside. 

## 2017-05-16 NOTE — ED Notes (Signed)
Right leg cleaned with NS and dressed with gauze. Patient refused x-ray of the right leg.
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
## 2017-05-17 ENCOUNTER — Telehealth: Payer: Self-pay | Admitting: Internal Medicine

## 2017-05-17 NOTE — Telephone Encounter (Signed)
Patient has been rescheduled for EGD from tomorrow pm at [facility]  to 06/02/17
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
## 2017-05-17 NOTE — Telephone Encounter (Signed)
Patient notified

## 2017-05-17 NOTE — Telephone Encounter (Signed)
And she has some questions about doing her iron infusions
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
## 2017-05-19 ENCOUNTER — Other Ambulatory Visit: Payer: Self-pay | Admitting: Internal Medicine

## 2017-05-19 DIAGNOSIS — R52 Pain, unspecified: Secondary | ICD-10-CM

## 2017-05-19 MED ORDER — EFFEXOR XR 75 MG PO CP24: ORAL_CAPSULE | ORAL | 1 refills | Status: DC

## 2017-05-19 NOTE — Telephone Encounter (Signed)
rx sent Procedure rescheduled to 06/20/17 10:00 will need to arrive at 8:30 She is asked via voicemail to confirm that she got my message about rescheduled procedure date and time

## 2017-05-19 NOTE — Telephone Encounter (Signed)
Patient aware of new EGD date and time

## 2017-05-19 NOTE — Telephone Encounter (Signed)
Patient states she needs medication effexor XR refilled at [facility] pharmacy. And would also like a call from nurse Barbera Setters to resch procedure at [facility] from December 20, to sometime in January.

## 2017-05-25 ENCOUNTER — Encounter (HOSPITAL_COMMUNITY): Payer: 59

## 2017-05-26 ENCOUNTER — Other Ambulatory Visit: Payer: Self-pay | Admitting: Endocrinology

## 2017-06-01 ENCOUNTER — Encounter (HOSPITAL_COMMUNITY): Payer: 59

## 2017-06-03 ENCOUNTER — Ambulatory Visit: Payer: Self-pay | Admitting: *Deleted

## 2017-06-03 ENCOUNTER — Other Ambulatory Visit: Payer: 59
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
## 2017-06-06 ENCOUNTER — Ambulatory Visit: Payer: 59 | Admitting: Family Medicine

## 2017-06-10 ENCOUNTER — Ambulatory Visit: Payer: 59 | Admitting: Endocrinology

## 2017-06-13 ENCOUNTER — Telehealth: Payer: Self-pay

## 2017-06-13 NOTE — Telephone Encounter (Signed)
Miranda Cochran called and was wanting to r/s her hospital EGD.  [name] it would be far out before he has another spot so she decided to keep her 06/20/17 appointment.  She said she needs to r/s her iron infusions , I gave her the number to call and do this, [phone].

## 2017-06-20 ENCOUNTER — Encounter (HOSPITAL_COMMUNITY): Payer: Self-pay | Admitting: *Deleted

## 2017-06-20 ENCOUNTER — Other Ambulatory Visit: Payer: Self-pay

## 2017-06-20 ENCOUNTER — Ambulatory Visit (HOSPITAL_COMMUNITY): Payer: 59 | Admitting: Anesthesiology

## 2017-06-20 ENCOUNTER — Encounter (HOSPITAL_COMMUNITY)
Admission: RE | Disposition: A | Discharge status: Home / Self Care | Payer: Self-pay | Source: Ambulatory Visit | Attending: Internal Medicine

## 2017-06-20 ENCOUNTER — Ambulatory Visit (HOSPITAL_COMMUNITY)
Admission: RE | Admit: 2017-06-20 | Discharge: 2017-06-20 | Disposition: A | Discharge status: Home / Self Care | Payer: 59 | Source: Ambulatory Visit | Attending: Internal Medicine | Admitting: Internal Medicine

## 2017-06-20 DIAGNOSIS — K219 Gastro-esophageal reflux disease without esophagitis: Secondary | ICD-10-CM | POA: Diagnosis not present

## 2017-06-20 DIAGNOSIS — K31819 Angiodysplasia of stomach and duodenum without bleeding: Secondary | ICD-10-CM | POA: Diagnosis not present

## 2017-06-20 DIAGNOSIS — F329 Major depressive disorder, single episode, unspecified: Secondary | ICD-10-CM | POA: Diagnosis not present

## 2017-06-20 DIAGNOSIS — K754 Autoimmune hepatitis: Secondary | ICD-10-CM | POA: Insufficient documentation

## 2017-06-20 DIAGNOSIS — Z803 Family history of malignant neoplasm of breast: Secondary | ICD-10-CM | POA: Diagnosis not present

## 2017-06-20 DIAGNOSIS — Z881 Allergy status to other antibiotic agents status: Secondary | ICD-10-CM | POA: Diagnosis not present

## 2017-06-20 DIAGNOSIS — Z7982 Long term (current) use of aspirin: Secondary | ICD-10-CM | POA: Diagnosis not present

## 2017-06-20 DIAGNOSIS — E785 Hyperlipidemia, unspecified: Secondary | ICD-10-CM | POA: Insufficient documentation

## 2017-06-20 DIAGNOSIS — Z823 Family history of stroke: Secondary | ICD-10-CM | POA: Diagnosis not present

## 2017-06-20 DIAGNOSIS — I10 Essential (primary) hypertension: Secondary | ICD-10-CM | POA: Insufficient documentation

## 2017-06-20 DIAGNOSIS — D500 Iron deficiency anemia secondary to blood loss (chronic): Secondary | ICD-10-CM

## 2017-06-20 DIAGNOSIS — Z888 Allergy status to other drugs, medicaments and biological substances status: Secondary | ICD-10-CM | POA: Insufficient documentation

## 2017-06-20 DIAGNOSIS — Z801 Family history of malignant neoplasm of trachea, bronchus and lung: Secondary | ICD-10-CM | POA: Insufficient documentation

## 2017-06-20 DIAGNOSIS — Z880 Allergy status to penicillin: Secondary | ICD-10-CM | POA: Insufficient documentation

## 2017-06-20 DIAGNOSIS — Z7951 Long term (current) use of inhaled steroids: Secondary | ICD-10-CM | POA: Diagnosis not present

## 2017-06-20 DIAGNOSIS — E559 Vitamin D deficiency, unspecified: Secondary | ICD-10-CM | POA: Diagnosis not present

## 2017-06-20 DIAGNOSIS — Z800 Family history of malignant neoplasm of digestive organs: Secondary | ICD-10-CM | POA: Diagnosis not present

## 2017-06-20 DIAGNOSIS — F419 Anxiety disorder, unspecified: Secondary | ICD-10-CM | POA: Diagnosis not present

## 2017-06-20 DIAGNOSIS — D509 Iron deficiency anemia, unspecified: Secondary | ICD-10-CM | POA: Diagnosis present

## 2017-06-20 DIAGNOSIS — K2270 Barrett's esophagus without dysplasia: Secondary | ICD-10-CM | POA: Insufficient documentation

## 2017-06-20 DIAGNOSIS — Z808 Family history of malignant neoplasm of other organs or systems: Secondary | ICD-10-CM | POA: Diagnosis not present

## 2017-06-20 DIAGNOSIS — E119 Type 2 diabetes mellitus without complications: Secondary | ICD-10-CM | POA: Diagnosis not present

## 2017-06-20 DIAGNOSIS — Z7984 Long term (current) use of oral hypoglycemic drugs: Secondary | ICD-10-CM | POA: Insufficient documentation

## 2017-06-20 DIAGNOSIS — Z79899 Other long term (current) drug therapy: Secondary | ICD-10-CM | POA: Diagnosis not present

## 2017-06-20 HISTORY — PX: ESOPHAGOGASTRODUODENOSCOPY (EGD) WITH PROPOFOL: SHX5813

## 2017-06-20 LAB — GLUCOSE, CAPILLARY: GLUCOSE-CAPILLARY: 75 mg/dL (ref 65–99)

## 2017-06-20 SURGERY — ESOPHAGOGASTRODUODENOSCOPY (EGD) WITH PROPOFOL
Anesthesia: Monitor Anesthesia Care

## 2017-06-20 MED ORDER — PROPOFOL 10 MG/ML IV BOLUS
INTRAVENOUS | Status: AC
  Filled 2017-06-20: qty 40

## 2017-06-20 MED ORDER — LACTATED RINGERS IV SOLN
INTRAVENOUS | Status: DC
  Administered 2017-06-20: 11:00:00 via INTRAVENOUS

## 2017-06-20 MED ORDER — PROPOFOL 500 MG/50ML IV EMUL
INTRAVENOUS | Status: DC | PRN
  Administered 2017-06-20: 125 ug/kg/min via INTRAVENOUS

## 2017-06-20 MED ORDER — BUTAMBEN-TETRACAINE-BENZOCAINE 2-2-14 % EX AERO
INHALATION_SPRAY | CUTANEOUS | Status: DC | PRN
  Administered 2017-06-20: 2 via TOPICAL

## 2017-06-20 MED ORDER — SODIUM CHLORIDE 0.9 % IV SOLN: INTRAVENOUS | Status: DC

## 2017-06-20 MED ORDER — PROPOFOL 10 MG/ML IV BOLUS
INTRAVENOUS | Status: DC | PRN
  Administered 2017-06-20: 30 mg via INTRAVENOUS
  Administered 2017-06-20: 20 mg via INTRAVENOUS
  Administered 2017-06-20: 40 mg via INTRAVENOUS
  Administered 2017-06-20: 20 mg via INTRAVENOUS
  Administered 2017-06-20: 30 mg via INTRAVENOUS
  Administered 2017-06-20: 40 mg via INTRAVENOUS
  Administered 2017-06-20: 20 mg via INTRAVENOUS

## 2017-06-20 MED ORDER — PROPOFOL 10 MG/ML IV BOLUS
INTRAVENOUS | Status: AC
  Filled 2017-06-20: qty 20

## 2017-06-20 SURGICAL SUPPLY — 15 items

## 2017-06-20 NOTE — Discharge Instructions (Signed)
° °  I think the procedure went well - was able to ablate most of the area of abnormality. I will have Sheri set up the iron infusions.  I think you may have a mild sore throat for a day or 2 and could spit out a slight bit of blood from where the scope and ablation device went through at the top of the esophagus. That should heal up without any problem.  I appreciate the opportunity to care for you. Gatha Mayer, MD, FACG  YOU HAD AN ENDOSCOPIC PROCEDURE TODAY: Refer to the procedure report and other information in the discharge instructions given to you for any specific questions about what was found during the examination. If this information does not answer your questions, please call Dr. Celesta Aver office at [phone] to clarify.   YOU SHOULD EXPECT: Some feelings of bloating in the abdomen. Passage of more gas than usual. Walking can help get rid of the air that was put into your GI tract during the procedure and reduce the bloating. If you had a lower endoscopy (such as a colonoscopy or flexible sigmoidoscopy) you may notice spotting of blood in your stool or on the toilet paper. Some abdominal soreness may be present for a day or two, also.  DIET: Your first meal following the procedure should be a light meal and then it is ok to progress to your normal diet. A half-sandwich or bowl of soup is an example of a good first meal. Heavy or fried foods are harder to digest and may make you feel nauseous or bloated. Drink plenty of fluids but you should avoid alcoholic beverages for 24 hours.   ACTIVITY: Your care partner should take you home directly after the procedure. You should plan to take it easy, moving slowly for the rest of the day. You can resume normal activity the day after the procedure however YOU SHOULD NOT DRIVE, use power tools, machinery or perform tasks that involve climbing or major physical exertion for 24 hours (because of the sedation medicines used during the test).     SYMPTOMS TO REPORT IMMEDIATELY: A gastroenterologist can be reached at any hour. Please call [phone]  for any of the following symptoms:   Following upper endoscopy (EGD, EUS, ERCP, esophageal dilation) Vomiting of blood or coffee ground material  New, significant abdominal pain  New, significant chest pain or pain under the shoulder blades  Painful or persistently difficult swallowing  New shortness of breath  Black, tarry-looking or red, bloody stools

## 2017-06-20 NOTE — Anesthesia Preprocedure Evaluation (Signed)
Anesthesia Evaluation  Patient identified by MRN, date of birth, ID band Patient awake    Reviewed: Allergy & Precautions, H&P , Patient's Chart, lab work & pertinent test results, reviewed documented beta blocker date and time   Airway Mallampati: II  TM Distance: >3 FB Neck ROM: full    Dental no notable dental hx.    Pulmonary asthma ,    Pulmonary exam normal breath sounds clear to auscultation       Cardiovascular hypertension,  Rhythm:regular Rate:Normal     Neuro/Psych    GI/Hepatic   Endo/Other  diabetes  Renal/GU      Musculoskeletal   Abdominal   Peds  Hematology   Anesthesia Other Findings   Reproductive/Obstetrics                             Anesthesia Physical Anesthesia Plan  ASA: II  Anesthesia Plan: MAC   Post-op Pain Management:    Induction: Intravenous  PONV Risk Score and Plan:   Airway Management Planned: Mask and Natural Airway  Additional Equipment:   Intra-op Plan:   Post-operative Plan:   Informed Consent: I have reviewed the patients History and Physical, chart, labs and discussed the procedure including the risks, benefits and alternatives for the proposed anesthesia with the patient or authorized representative who has indicated his/her understanding and acceptance.   Dental Advisory Given  Plan Discussed with: CRNA and Surgeon  Anesthesia Plan Comments:         Anesthesia Quick Evaluation

## 2017-06-20 NOTE — Op Note (Signed)
[facility] Patient Name: Miranda Cochran Procedure Date: 06/20/2017 MRN: [id number] Attending MD: Gatha Mayer , MD Date of Birth: November 19, 1950 CSN: [id number] Age: 67 Admit Type: Outpatient Procedure:                Upper GI endoscopy Indications:              Suspected upper gastrointestinal bleeding in                            patient with chronic blood loss, Watermelon stomach                            (GAVE syndrome) Providers:                Gatha Mayer, MD, Kingsley Plan, RN, Cherylynn Ridges, Technician, Derrek Gu. Alday CRNA, CRNA Referring MD:              Medicines:                Propofol per Anesthesia, Monitored Anesthesia Care Complications:            No immediate complications. Estimated Blood Loss:     Estimated blood loss was minimal. Slight heme in                            upper esophageal sphincter from passage of scope                            and RFA device. Procedure:                Pre-Anesthesia Assessment:                           - Prior to the procedure, a History and Physical                            was performed, and patient medications and                            allergies were reviewed. The patient's tolerance of                            previous anesthesia was also reviewed. The risks                            and benefits of the procedure and the sedation                            options and risks were discussed with the patient.                            All questions were answered, and informed consent  was obtained. Prior Anticoagulants: The patient has                            taken no previous anticoagulant or antiplatelet                            agents. ASA Grade Assessment: II - A patient with                            mild systemic disease. After reviewing the risks                            and benefits, the patient was deemed in                             satisfactory condition to undergo the procedure.                           After obtaining informed consent, the endoscope was                            passed under direct vision. Throughout the                            procedure, the patient's blood pressure, pulse, and                            oxygen saturations were monitored continuously. The                            EG-[id number] ([id number]) scope was introduced through the                            mouth, and advanced to the second part of duodenum.                            The upper GI endoscopy was accomplished without                            difficulty. The patient tolerated the procedure                            well. 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 Focal radiofrequency ablation of Barrett's esophagus       was performed. With the endoscope in place, the position and extent of       the Barrett's mucosa and the anatomic landmarks were noted. Endoscopic       visualization identified an ablation site. The endoscope was then       removed from the patient. The Barrx-60 radiofrequency ablation catheter       was attached to the tip of the endoscope. The endoscope with the       attached  radiofrequency ablation catheter was then passed transorally       under direct vision into the esophagus and advanced to the areas of       Barrett's mucosa. The radiofrequency ablation catheter was placed in       contact with the surface of the Barrett's mucosa under direct       visualization and energy was applied. The areas of the esophagus where       Barrett's mucosa had been ablated were examined. Whitish changes of       ablated mucosa were present. Estimated blood loss: none.      The exam was otherwise without abnormality.      The cardia and gastric fundus were normal on retroflexion. Impression:               -  Gastric antral vascular ectasia without bleeding.                            Treated with radiofrequency ablation.                           - The examination was otherwise normal.                           - No specimens collected. Moderate Sedation:      N/A- Per Anesthesia Care Recommendation:           - Patient has a contact number available for                            emergencies. The signs and symptoms of potential                            delayed complications were discussed with the                            patient. Return to normal activities tomorrow.                            Written discharge instructions were provided to the                            patient.                           - Resume previous diet.                           - Continue present medications.                           - My office will contact her and reschedule                            feraheme infusions x 2                           she will need CBC, ferritin and LFT 1 month after  second feraheme and follow-up appointment with me                            in 2 months from now approximately (should be after                            all above completed) Procedure Code(s):        --- Professional ---                           [phone], Esophagogastroduodenoscopy, flexible,                            transoral; with ablation of tumor(s), polyp(s), or                            other lesion(s) (includes pre- and post-dilation                            and guide wire passage, when performed) Diagnosis Code(s):        --- Professional ---                           T15.726, Angiodysplasia of stomach and duodenum                            without bleeding                           R58, Hemorrhage, not elsewhere classified CPT copyright 2016 American Medical Association. All rights reserved. The codes documented in this report are preliminary and upon coder review may   be revised to meet current compliance requirements. Gatha Mayer, MD 06/20/2017 11:42:39 AM This report has been signed electronically. Number of Addenda: 0
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
## 2017-06-20 NOTE — Transfer of Care (Signed)
Immediate Anesthesia Transfer of Care Note  Patient: Miranda Cochran  Procedure(s) Performed: ESOPHAGOGASTRODUODENOSCOPY (EGD) WITH PROPOFOL (N/A ) Barrx  Patient Location: PACU  Anesthesia Type:MAC  Level of Consciousness: sedated  Airway & Oxygen Therapy: Patient Spontanous Breathing and Patient connected to face mask oxygen  Post-op Assessment: Report given to RN and Post -op Vital signs reviewed and stable  Post vital signs: Reviewed and stable  Last Vitals:  Vitals:   06/20/17 1030  BP: (!) 189/83  Pulse: 91  Resp: 18  Temp: 37.2 C  SpO2: 97%    Last Pain:  Vitals:   06/20/17 1030  TempSrc: Oral         Complications: No apparent anesthesia complications

## 2017-06-20 NOTE — Anesthesia Postprocedure Evaluation (Signed)
Anesthesia Post Note  Patient: Miranda Cochran  Procedure(s) Performed: ESOPHAGOGASTRODUODENOSCOPY (EGD) WITH PROPOFOL (N/A ) Barrx     Patient location during evaluation: PACU Anesthesia Type: MAC Level of consciousness: awake and alert Pain management: pain level controlled Vital Signs Assessment: post-procedure vital signs reviewed and stable Respiratory status: spontaneous breathing, nonlabored ventilation, respiratory function stable and patient connected to nasal cannula oxygen Cardiovascular status: stable and blood pressure returned to baseline Postop Assessment: no apparent nausea or vomiting Anesthetic complications: no    Last Vitals:  Vitals:   06/20/17 1140 06/20/17 1150  BP: (!) 173/86 (!) 147/86  Pulse: 87 85  Resp: (!) 25 16  Temp:    SpO2: 97% 98%    Last Pain:  Vitals:   06/20/17 1130  TempSrc: Oral                 Lavonna Lampron EDWARD

## 2017-06-21 ENCOUNTER — Other Ambulatory Visit: Payer: Self-pay | Admitting: Family Medicine

## 2017-06-21 ENCOUNTER — Encounter (HOSPITAL_COMMUNITY): Payer: Self-pay | Admitting: Internal Medicine

## 2017-06-21 DIAGNOSIS — J40 Bronchitis, not specified as acute or chronic: Secondary | ICD-10-CM

## 2017-06-27 ENCOUNTER — Other Ambulatory Visit: Payer: Self-pay | Admitting: Endocrinology

## 2017-06-27 ENCOUNTER — Other Ambulatory Visit: Payer: Self-pay | Admitting: Internal Medicine

## 2017-06-27 DIAGNOSIS — R52 Pain, unspecified: Secondary | ICD-10-CM

## 2017-06-27 NOTE — Telephone Encounter (Signed)
Please advise how many refills Sir. 

## 2017-06-27 NOTE — Telephone Encounter (Signed)
I can refill if she needs but was planning on her getting form her PCP I think  If she needs it go ahead x 2 mos and ask her to get PCP to refill in future

## 2017-06-27 NOTE — Telephone Encounter (Signed)
Left message for Miranda Cochran to call me.

## 2017-06-28 ENCOUNTER — Telehealth: Payer: Self-pay

## 2017-06-28 MED ORDER — LANSOPRAZOLE 30 MG PO CPDR: 30.0000 mg | DELAYED_RELEASE_CAPSULE | Freq: Every day | ORAL | 1 refills | Status: DC

## 2017-06-28 MED ORDER — ONDANSETRON HCL 4 MG PO TABS: 4.0000 mg | ORAL_TABLET | Freq: Four times a day (QID) | ORAL | 1 refills | Status: DC | PRN

## 2017-06-28 NOTE — Telephone Encounter (Signed)
rx sent Left message for patient to call back  

## 2017-06-28 NOTE — Telephone Encounter (Signed)
Left message for patient to call back appt has been rescheduled to 07/05/17 9:00 at [facility].  They will assist her in scheduling the second infusion.  Follow up is scheduled for 08/31/17 8:30

## 2017-06-28 NOTE — Telephone Encounter (Signed)
Patient notified

## 2017-06-28 NOTE — Telephone Encounter (Signed)
Miranda Cochran called in and has questions about her EGD that was done recently.  Since the procedure she has had indigeston, nausea and vomiting ( 3 times this AM).  She also wants to know if her iron infusions have been rescheduled.  She is best reached on her cell #.

## 2017-06-28 NOTE — Telephone Encounter (Signed)
Miranda Cochran's PCP has left .  I spoke with Dr Carlean Purl and he said I could refill her medicine.  I will inform her that we will do this.

## 2017-06-28 NOTE — Telephone Encounter (Signed)
1) Autoimmune hepatitis does not usually cause anemia 2) Ondansetron 4 mg q 6 prn # 30 1 Refill 3) Have her take Prevacid daily please x 2 months - if she needs Rx we can do that 4) she should have a March f/u me if not already set up

## 2017-06-28 NOTE — Telephone Encounter (Signed)
Patient notified of the feraheme for 07/05/17 9:00.  She is aware that they will help her schedule the second infusion a the time of the appt..  She wants you to know she has had nausea and heartburn since the procedure.  She believes that her anemia is from autoimmune hepatitis and not the GAVE syndrome.  She would like to know if you think this is possible.  Can we call her in zofran to use during the day? Should she be on a PPI long term? She is using prevacid intermittently.

## 2017-07-05 ENCOUNTER — Ambulatory Visit (HOSPITAL_COMMUNITY)
Admission: RE | Admit: 2017-07-05 | Discharge: 2017-07-05 | Disposition: A | Discharge status: Home / Self Care | Payer: 59 | Source: Ambulatory Visit | Attending: Internal Medicine | Admitting: Internal Medicine

## 2017-07-05 DIAGNOSIS — D508 Other iron deficiency anemias: Secondary | ICD-10-CM | POA: Insufficient documentation

## 2017-07-05 MED ORDER — SODIUM CHLORIDE 0.9 % IV SOLN
510.0000 mg | INTRAVENOUS | Status: DC
  Administered 2017-07-05: 510 mg via INTRAVENOUS
  Filled 2017-07-05: qty 17

## 2017-07-05 NOTE — Discharge Instructions (Signed)

## 2017-07-07 ENCOUNTER — Telehealth: Payer: Self-pay | Admitting: Internal Medicine

## 2017-07-07 NOTE — Telephone Encounter (Signed)
Patient reports that she had 7 episodes of vomiting the day after her iron infusion.  She has not had any more vomiting.  She is weak.  She feels that this is related to the IV iron.  She is not happy with the way zofran made her feel.  She is asking what you think.  She also has a HA and no appetite.  She is scheduled for her next infusion next Wed.  Please advise

## 2017-07-07 NOTE — Telephone Encounter (Signed)
Patient wanting to speak to nurse about infusion she had on Tuesday 1.22.19. Patient states since infusion she has been throwing up and taking medication zofran did not help, it only made her tired. Pt states she is scheduled for another infusion but is a bit worried to get it. Requesting call.

## 2017-07-08 ENCOUNTER — Other Ambulatory Visit (INDEPENDENT_AMBULATORY_CARE_PROVIDER_SITE_OTHER): Payer: 59

## 2017-07-08 DIAGNOSIS — E1165 Type 2 diabetes mellitus with hyperglycemia: Secondary | ICD-10-CM

## 2017-07-08 LAB — COMPREHENSIVE METABOLIC PANEL
ALT: 27 U/L (ref 0–35)
AST: 34 U/L (ref 0–37)
Albumin: 4.2 g/dL (ref 3.5–5.2)
Alkaline Phosphatase: 84 U/L (ref 39–117)
BUN: 13 mg/dL (ref 6–23)
CHLORIDE: 95 meq/L — AB (ref 96–112)
CO2: 28 mEq/L (ref 19–32)
CREATININE: 0.66 mg/dL (ref 0.40–1.20)
Calcium: 10.1 mg/dL (ref 8.4–10.5)
GFR: 95.47 mL/min (ref >60.00)
GLUCOSE: 113 mg/dL — AB (ref 70–99)
POTASSIUM: 4.1 meq/L (ref 3.5–5.1)
SODIUM: 135 meq/L (ref 135–145)
TOTAL PROTEIN: 8.1 g/dL (ref 6.0–8.3)
Total Bilirubin: 0.8 mg/dL (ref 0.2–1.2)

## 2017-07-08 LAB — HEMOGLOBIN A1C: HEMOGLOBIN A1C: 5.9 % (ref 4.6–6.5)

## 2017-07-08 NOTE — Telephone Encounter (Signed)
If the vomiting started the next day I very much doubt that it was a reaction to the infusion but of course cannot say 100%  It's her call if she gets another one.  We could change to the inflectra  Perhaps - but if she does not get the second Feraheme would just have her do the labs and follow-up as we planned and will discuss at next visit

## 2017-07-08 NOTE — Telephone Encounter (Signed)
Left message for patient to call back  

## 2017-07-08 NOTE — Telephone Encounter (Signed)
Patient notified of response.  She has already cancelled the next infusion and is not interested in alternative.  She will come for labs in 2 weeks.

## 2017-07-09 LAB — FRUCTOSAMINE: Fructosamine: 266 umol/L (ref 0–285)

## 2017-07-12 ENCOUNTER — Encounter: Payer: Self-pay | Admitting: Endocrinology

## 2017-07-12 ENCOUNTER — Ambulatory Visit: Payer: 59 | Admitting: Endocrinology

## 2017-07-12 DIAGNOSIS — IMO0001 Reserved for inherently not codable concepts without codable children: Secondary | ICD-10-CM

## 2017-07-12 DIAGNOSIS — E1165 Type 2 diabetes mellitus with hyperglycemia: Secondary | ICD-10-CM

## 2017-07-12 LAB — MICROALBUMIN / CREATININE URINE RATIO
Creatinine,U: 55.8 mg/dL
MICROALB UR: 8.2 mg/dL — AB (ref 0.0–1.9)
Microalb Creat Ratio: 14.7 mg/g (ref 0.0–30.0)

## 2017-07-12 MED ORDER — GLUCOSE BLOOD VI STRP: ORAL_STRIP | 12 refills | Status: DC

## 2017-07-12 NOTE — Progress Notes (Signed)
[name], female   DOB: 1950/11/21, 67 y.o.   MRN: [id number]          Reason for Appointment: Follow-up for Type 2 Diabetes    History of Present Illness:          Date of diagnosis of type 2 diabetes mellitus: ?  2013        Background history:   She had a blood sugar over 200 in 2013 but was not told to have diabetes until 04/2015 A1c was 7.3 in 3/16 In 04/2015 blood sugar was 478 says her blood sugar was 600 when she was diagnosed and was not having any significant symptoms Initially treated with Metaglip but this apparently caused abdominal discomfort and she was switched to metformin In 2017 she had been tried on Bydureon for better control but she stopped this after a few weeks because of large persisted nodules in her skin She thinks her blood sugars were better with this and A1c was down to 6.8  Recent history:   Non-insulin hypoglycemic drugs the patient is taking are: Trulicity 1.5 mg weekly and metformin 500 mg twice a day, Amaryl 2  Her A1c is again only 5.9, previously 6.3, has been as high as 8.3 previously However her fructosamine is better at 266 now  Current management, blood sugar patterns and problems identified:  She is coming back for follow-up about 4 months later  She said that she has had more difficulties with her nausea and having problems with anemia as well as getting an endoscopywhich has been stressful  Although she is having a good A1c she has mostly high blood sugars at home with only 2 good readings  Lab glucose was 113  She thinks that she is not craving sweets and carbohydrates as much but frequently will drink sweet tea  Her blood sugars are mostly high late in the evening  Because of other issues going on she has not exercised lately        Side effects from medications have been: Metaglip caused abdominal pain, Tradjenta caused swelling of the legs, Bydureon caused skin nodules, Farxiga caused diarrhea  Compliance  with the medical regimen: Inconsistent  Glucose monitoring:  done less than 1 times a day         Glucometer: One Touch.      Blood Glucose readings by time of day   Mean values apply above for all meters except median for One Touch  PRE-MEAL morning Lunch Dinner Bedtime Overall  Glucose range: 90-255  202 136-388   Mean/median: 174    202    Self-care:  Typical meal intake: Breakfast is Oatmeal, usually eating low-fat dinner and her snacks will be fruit, peanut butter crackers                Dietician visit, most recent:None               Exercise: none  Weight history:  Wt Readings from Last 3 Encounters:  07/12/17 138 lb 12.8 oz (63 kg)  07/05/17 141 lb (64 kg)  06/20/17 141 lb (64 kg)    Glycemic control:   Lab Results  Component Value Date   HGBA1C 5.9 07/08/2017   HGBA1C 5.9 02/22/2017   HGBA1C 6.3 11/19/2016   Lab Results  Component Value Date   MICROALBUR 8.2 (H) 07/12/2017   LDLCALC 174 (H) 02/22/2017   CREATININE 0.66 07/08/2017   Lab Results  Component Value Date   MICRALBCREAT  14.7 07/12/2017    Lab Results  Component Value Date   FRUCTOSAMINE 266 07/08/2017   FRUCTOSAMINE 352 (H) 10/08/2016    Office Visit on 07/12/2017  Component Date Value Ref Range Status  . Microalb, Ur 07/12/2017 8.2* 0.0 - 1.9 mg/dL Final  . Creatinine,U 07/12/2017 55.8  mg/dL Final  . Microalb Creat Ratio 07/12/2017 14.7  0.0 - 30.0 mg/g Final  Lab on 07/08/2017  Component Date Value Ref Range Status  . Fructosamine 07/08/2017 266  0 - 285 umol/L Final   Comment: Published reference interval for apparently healthy subjects between age 69 and 69 is 91 - 285 umol/L and in a poorly controlled diabetic population is 228 - 563 umol/L with a mean of 396 umol/L.   Marland Kitchen Sodium 07/08/2017 135  135 - 145 mEq/L Final  . Potassium 07/08/2017 4.1  3.5 - 5.1 mEq/L Final  . Chloride 07/08/2017 95* 96 - 112 mEq/L Final  . CO2 07/08/2017 28  19 - 32 mEq/L Final  . Glucose, Bld  07/08/2017 113* 70 - 99 mg/dL Final  . BUN 07/08/2017 13  6 - 23 mg/dL Final  . Creatinine, Ser 07/08/2017 0.66  0.40 - 1.20 mg/dL Final  . Total Bilirubin 07/08/2017 0.8  0.2 - 1.2 mg/dL Final  . Alkaline Phosphatase 07/08/2017 84  39 - 117 U/L Final  . AST 07/08/2017 34  0 - 37 U/L Final  . ALT 07/08/2017 27  0 - 35 U/L Final  . Total Protein 07/08/2017 8.1  6.0 - 8.3 g/dL Final  . Albumin 07/08/2017 4.2  3.5 - 5.2 g/dL Final  . Calcium 07/08/2017 10.1  8.4 - 10.5 mg/dL Final  . GFR 07/08/2017 95.47  >60.00 mL/min Final  . Hgb A1c MFr Bld 07/08/2017 5.9  4.6 - 6.5 % Final   Glycemic Control Guidelines for People with Diabetes:Non Diabetic:  <6%Goal of Therapy: <7%Additional Action Suggested:  >8%      Allergies as of 07/12/2017      Reactions   Fentanyl Anxiety   Made her feel very anxious, requests not to get it.   Levofloxacin Anxiety   Weird dreams    Prednisone    Anxiety and paranoia   Amoxicillin Diarrhea   Has patient had a PCN reaction causing immediate rash, facial/tongue/throat swelling, SOB or lightheadedness with hypotension: No Has patient had a PCN reaction causing severe rash involving mucus membranes or skin necrosis: No Has patient had a PCN reaction that required hospitalization: No Has patient had a PCN reaction occurring within the last 10 years: Yes If all of the above answers are "NO", then may proceed with Cephalosporin use.   Augmentin [amoxicillin-pot Clavulanate] Other (See Comments)   diarrhea   Farxiga [dapagliflozin] Diarrhea   Gi pain   Statins    Myalgia       Medication List        Accurate as of 07/12/17 11:59 PM. Always use your most recent med list.          ALPRAZolam 0.5 MG tablet Commonly known as:  XANAX Take 1 tablet (0.5 mg total) by mouth at bedtime as needed for anxiety.   aspirin 325 MG tablet Take 650-975 mg by mouth daily as needed for moderate pain or headache.   azelastine 0.05 % ophthalmic solution Commonly known  as:  OPTIVAR instill 1 drop into affected eye DAILY   blood glucose meter kit and supplies Kit Dispense based on patient and insurance preference. Use up to four  times daily as directed. (FOR ICD-9 250.00, 250.01).   budesonide 3 MG 24 hr capsule Commonly known as:  ENTOCORT EC Take 3 capsules (9 mg total) daily by mouth.   cyclobenzaprine 10 MG tablet Commonly known as:  FLEXERIL take 1 tablet by mouth at bedtime if needed   diphenhydrAMINE 25 mg capsule Commonly known as:  BENADRYL Take 25-50 mg by mouth daily as needed for allergies.   diphenhydrAMINE-zinc acetate cream Commonly known as:  BENADRYL Apply 1 application topically daily as needed for itching.   EFFEXOR XR 75 MG 24 hr capsule Generic drug:  venlafaxine XR TAKE ONE CAPSULE BY MOUTH TWICE DAILY WITH A MEAL   ferrous sulfate 325 (65 FE) MG tablet Take 1 tablet (325 mg total) by mouth daily with breakfast.   Fluticasone-Salmeterol 100-50 MCG/DOSE Aepb Commonly known as:  ADVAIR DISKUS Inhale 1 puff into the lungs 2 (two) times daily.   furosemide 20 MG tablet Commonly known as:  LASIX Take 1 tablet (20 mg total) by mouth daily as needed.   glimepiride 2 MG tablet Commonly known as:  AMARYL TAKE 1 TABLET(2 MG) BY MOUTH DAILY BEFORE BREAKFAST   glucose blood test strip Use as instructed   HAIR SKIN AND NAILS FORMULA Tabs Take 1 tablet by mouth daily.   lansoprazole 30 MG capsule Commonly known as:  PREVACID TAKE ONE CAPSULE BY MOUTH ONCE DAILY   metFORMIN 500 MG 24 hr tablet Commonly known as:  GLUCOPHAGE-XR TAKE 3 TABLETS(1500 MG) BY MOUTH DAILY WITH SUPPER   montelukast 10 MG tablet Commonly known as:  SINGULAIR TAKE 1 TABLET BY MOUTH AT BEDTIME.   ondansetron 4 MG tablet Commonly known as:  ZOFRAN Take 1 tablet (4 mg total) by mouth every 6 (six) hours as needed for nausea or vomiting.   ONETOUCH DELICA LANCETS 76H Misc USE UP TO QIF AS DIRECTED   oxyCODONE-acetaminophen 5-325 MG  tablet Commonly known as:  PERCOCET/ROXICET Take 1 tablet by mouth every 8 (eight) hours as needed.   promethazine 12.5 MG tablet Commonly known as:  PHENERGAN Take 1 tablet (12.5 mg total) by mouth every 8 (eight) hours as needed for nausea or vomiting.   pseudoephedrine 30 MG tablet Commonly known as:  SUDAFED Take 30 mg by mouth daily as needed for congestion.   TRULICITY 1.5 YW/7.3XT Sopn Generic drug:  Dulaglutide INJECT ONCE WEEKLY AS DIRECTED       Allergies:  Allergies  Allergen Reactions  . Fentanyl Anxiety    Made her feel very anxious, requests not to get it.  . Levofloxacin Anxiety    Weird dreams   . Prednisone     Anxiety and paranoia  . Amoxicillin Diarrhea    Has patient had a PCN reaction causing immediate rash, facial/tongue/throat swelling, SOB or lightheadedness with hypotension: No Has patient had a PCN reaction causing severe rash involving mucus membranes or skin necrosis: No Has patient had a PCN reaction that required hospitalization: No Has patient had a PCN reaction occurring within the last 10 years: Yes If all of the above answers are "NO", then may proceed with Cephalosporin use.   . Augmentin [Amoxicillin-Pot Clavulanate] Other (See Comments)    diarrhea  . Wilder Glade [Dapagliflozin] Diarrhea    Gi pain  . Statins     Myalgia     Past Medical History:  Diagnosis Date  . Allergy   . Anxiety   . Asthma   . Autoimmune hepatitis ([place]) 08/18/2016   08/2016 liver bx  confirms, fibrosis not cirrhosis  . Depression   . DM (diabetes mellitus) ([place])   . 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 GERD (gastroesophageal reflux disease)   . Hiatal hernia   . Hx of adenomatous polyp of colon 07/07/2009  . Hyperlipidemia   . Hypertension   . IBS (irritable bowel syndrome)   . Iron deficiency anemia   . Sinusitis, chronic   . Thyroid nodule   . Umbilical hernia   . Uterine fibroid   . Vascular disease   . Vitamin D deficiency      Past Surgical History:  Procedure Laterality Date  . CATARACT EXTRACTION Bilateral   . COLONOSCOPY  11/1991, 06/2009  . ESOPHAGOGASTRODUODENOSCOPY  02/2002  . ESOPHAGOGASTRODUODENOSCOPY (EGD) WITH PROPOFOL N/A 06/20/2017   Procedure: ESOPHAGOGASTRODUODENOSCOPY (EGD) WITH PROPOFOL;  Surgeon: Gatha Mayer, MD;  Location: [facility];  Service: Endoscopy;  Laterality: N/A;  with Barrx  . EYE SURGERY    . NASAL SEPTOPLASTY W/ TURBINOPLASTY    . TONSILLECTOMY      Family History  Problem Relation Age of Onset  . Stroke Mother   . Hypertension Mother   . Colon cancer Father   . Liver cancer Father   . Heart disease Sister        hole in heart  . Stroke Maternal Grandmother   . Hypertension Maternal Grandmother   . Esophageal cancer Maternal Grandfather   . Breast cancer Paternal Grandmother   . Brain cancer Paternal Grandmother   . Lung cancer Paternal Grandfather   . Diabetes Neg Hx     Social History:  reports that  has never smoked. she has never used smokeless tobacco. She reports that she drinks alcohol. She reports that she does not use drugs.   Review of Systems   Lipid history: Previously had tried Zocor which apparently caused muscle aches, she took Crestor and she thinks generic causes muscle cramps  She took Livalo for a couple of days but she thinks she didn't feel well with this and had some palpitations and does not want to take it again Currently on no treatment    Lab Results  Component Value Date   CHOL 246 (H) 02/22/2017   HDL 50.50 02/22/2017   LDLCALC 174 (H) 02/22/2017   TRIG 108.0 02/22/2017   CHOLHDL 5 02/22/2017           Lab Results  Component Value Date   ALT 27 07/08/2017    Hypertension: None, she is taking Maxzide because of tendency to swelling in her abdomen Blood pressure is variably high   BP Readings from Last 3 Encounters:  07/12/17 136/86  07/05/17 (!) 170/71  06/20/17 (!) 147/86     Most recent eye exam was  7/17  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 Microalb, Ur 07/12/2017 8.2* 0.0 - 1.9 mg/dL Final  . Creatinine,U 07/12/2017 55.8  mg/dL Final  . Microalb Creat Ratio 07/12/2017 14.7  0.0 - 30.0 mg/g Final  Lab on 07/08/2017  Component Date Value Ref Range Status  . Fructosamine 07/08/2017 266  0 - 285 umol/L Final   Comment: Published reference interval for apparently healthy subjects between age 75 and 38 is 56 - 285 umol/L and in a poorly controlled diabetic population is 228 - 563 umol/L with a mean of 396 umol/L.   Marland Kitchen Sodium 07/08/2017  135  135 - 145 mEq/L Final  . Potassium 07/08/2017 4.1  3.5 - 5.1 mEq/L Final  . Chloride 07/08/2017 95* 96 - 112 mEq/L Final  . CO2 07/08/2017 28  19 - 32 mEq/L Final  . Glucose, Bld 07/08/2017 113* 70 - 99 mg/dL Final  . BUN 07/08/2017 13  6 - 23 mg/dL Final  . Creatinine, Ser 07/08/2017 0.66  0.40 - 1.20 mg/dL Final  . Total Bilirubin 07/08/2017 0.8  0.2 - 1.2 mg/dL Final  . Alkaline Phosphatase 07/08/2017 84  39 - 117 U/L Final  . AST 07/08/2017 34  0 - 37 U/L Final  . ALT 07/08/2017 27  0 - 35 U/L Final  . Total Protein 07/08/2017 8.1  6.0 - 8.3 g/dL Final  . Albumin 07/08/2017 4.2  3.5 - 5.2 g/dL Final  . Calcium 07/08/2017 10.1  8.4 - 10.5 mg/dL Final  . GFR 07/08/2017 95.47  >60.00 mL/min Final  . Hgb A1c MFr Bld 07/08/2017 5.9  4.6 - 6.5 % Final   Glycemic Control Guidelines for People with Diabetes:Non Diabetic:  <6%Goal of Therapy: <7%Additional Action Suggested:  >8%     Physical Examination:  BP 136/86 (BP Location: Left Arm, Patient Position: Sitting, Cuff Size: Normal)   Pulse 95   Temp 99.5 F (37.5 C) (Oral)   Ht 5' 6" (1.676 m)   Wt 138 lb 12.8 oz (63 kg)   SpO2 98%   BMI 22.40 kg/m          ASSESSMENT:  Diabetes type 2, uncontrolled with normal BMI of 23     See history of present illness for  detailed discussion of current diabetes management, blood sugar patterns and problems identified  A1c 5.9 but lower than expected for her sugars at home as before However fructosamine appears to be better and most likely her high sugars may be more sporadic with her trying to cut back on sweets  She tends to have a poor diet with excess carbohydrates which causes higher blood sugars and also drinking regular sweet tea at times   HYPERLIPIDEMIA: She reports intolerances to various statins and will not take anything   PLAN:   she will eliminate sweet tea completely More consistent control of carbohydrates She'll need to check more blood sugars on a routine basis at different times incident of just when she goes off her diet Resume exercise when able to Discussed blood sugar targets No change in Trulicity as yet May consider Invokana instead of Amaryl if her fructosamine is also high She should see the dietitian but she just does not want to do this or take the time to do it  Will reassess in 2 months    Patient Instructions  No sweet tea  More testing after meals  Walk when u can     Elayne Snare 07/13/2017, 5:05 PM   Note: This office note was prepared with Dragon voice recognition system technology. Any transcriptional errors that result from this process are unintentional.

## 2017-07-12 NOTE — Patient Instructions (Addendum)
No sweet tea  More testing after meals  Walk when u can

## 2017-07-13 ENCOUNTER — Telehealth: Payer: Self-pay | Admitting: Endocrinology

## 2017-07-13 ENCOUNTER — Encounter (HOSPITAL_COMMUNITY): Payer: 59

## 2017-07-13 NOTE — Telephone Encounter (Signed)
Stephanie from [facility], need to know how many times patient is testing (test strips) for the one touch meter please advise  Phone [phone]

## 2017-07-14 ENCOUNTER — Other Ambulatory Visit: Payer: Self-pay | Admitting: Endocrinology

## 2017-07-15 ENCOUNTER — Other Ambulatory Visit: Payer: Self-pay

## 2017-07-15 NOTE — Telephone Encounter (Signed)
Done

## 2017-07-22 ENCOUNTER — Other Ambulatory Visit: Payer: Self-pay

## 2017-07-22 DIAGNOSIS — IMO0001 Reserved for inherently not codable concepts without codable children: Secondary | ICD-10-CM

## 2017-07-22 DIAGNOSIS — E1165 Type 2 diabetes mellitus with hyperglycemia: Principal | ICD-10-CM

## 2017-07-22 MED ORDER — GLUCOSE BLOOD VI STRP: ORAL_STRIP | 12 refills | Status: DC

## 2017-08-01 ENCOUNTER — Other Ambulatory Visit: Payer: Self-pay | Admitting: Internal Medicine

## 2017-08-01 DIAGNOSIS — R52 Pain, unspecified: Secondary | ICD-10-CM

## 2017-08-02 NOTE — Telephone Encounter (Signed)
Okay to refill Sir? 

## 2017-08-03 NOTE — Telephone Encounter (Signed)
Refill x 1 year 

## 2017-08-14 ENCOUNTER — Other Ambulatory Visit: Payer: Self-pay | Admitting: Endocrinology

## 2017-08-21 ENCOUNTER — Ambulatory Visit (INDEPENDENT_AMBULATORY_CARE_PROVIDER_SITE_OTHER): Payer: 59

## 2017-08-21 ENCOUNTER — Ambulatory Visit (HOSPITAL_COMMUNITY)
Admission: EM | Admit: 2017-08-21 | Discharge: 2017-08-21 | Disposition: A | Discharge status: Home / Self Care | Payer: 59 | Attending: Internal Medicine | Admitting: Internal Medicine

## 2017-08-21 ENCOUNTER — Encounter (HOSPITAL_COMMUNITY): Payer: Self-pay | Admitting: Emergency Medicine

## 2017-08-21 ENCOUNTER — Ambulatory Visit (HOSPITAL_COMMUNITY): Payer: 59

## 2017-08-21 ENCOUNTER — Other Ambulatory Visit: Payer: Self-pay

## 2017-08-21 DIAGNOSIS — S82831A Other fracture of upper and lower end of right fibula, initial encounter for closed fracture: Secondary | ICD-10-CM

## 2017-08-21 DIAGNOSIS — M545 Low back pain, unspecified: Secondary | ICD-10-CM

## 2017-08-21 MED ORDER — OXYCODONE-ACETAMINOPHEN 5-325 MG PO TABS: 0.5000 | ORAL_TABLET | Freq: Every day | ORAL | 0 refills | Status: DC | PRN

## 2017-08-21 MED ORDER — MELOXICAM 7.5 MG PO TABS: 7.5000 mg | ORAL_TABLET | Freq: Every day | ORAL | 0 refills | Status: DC

## 2017-08-21 NOTE — ED Notes (Addendum)
Pt refused Crutches stating she already have one at home

## 2017-08-21 NOTE — ED Triage Notes (Signed)
States fell on right side last PM after tripped over cat and c/o pain in leg and ankle

## 2017-08-21 NOTE — ED Provider Notes (Signed)
[facility]    CSN: [id number] Arrival date & time: 08/21/17  1206     History   Chief Complaint Chief Complaint  Patient presents with  . Leg Injury    HPI Miranda Cochran is a 67 y.o. female.   66 year old female comes in for right-sided pain after fall last night.  Patient states that she tripped over her cat, fell towards the right side and impacted the door.  Denies head injury, loss of consciousness.  States that she felt twisting of the knee and ankle during the fall.  Has been having painful weightbearing since.  No obvious swelling.  Took half of an  oxycodone for the pain.  Denies numbness, tingling.  Denies saddle anesthesia, loss of bladder or bowel control.      Past Medical History:  Diagnosis Date  . Allergy   . Anxiety   . Asthma   . Autoimmune hepatitis ([place]) 08/18/2016   08/2016 liver bx confirms, fibrosis not cirrhosis  . Depression   . DM (diabetes mellitus) ([place])   . 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 GERD (gastroesophageal reflux disease)   . Hiatal hernia   . Hx of adenomatous polyp of colon 07/07/2009  . Hyperlipidemia   . Hypertension   . IBS (irritable bowel syndrome)   . Iron deficiency anemia   . Sinusitis, chronic   . Thyroid nodule   . Umbilical hernia   . Uterine fibroid   . Vascular disease   . Vitamin D deficiency     Patient Active Problem List   Diagnosis Date Noted  . 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 Heme + stool 10/18/2016  . Iron deficiency anemia 08/25/2016  . 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 Sinusitis, chronic   . Hypertension   . Hyperlipidemia   . Vitamin D deficiency   . Thyroid nodule 04/29/2011  . Hx of adenomatous polyp of colon 07/07/2009  . IRRITABLE BOWEL SYNDROME 10/26/2007  . Uncontrolled diabetes mellitus ([place]) 10/24/2007  . Anxiety state 10/24/2007  . GERD 10/24/2007    Past Surgical History:  Procedure Laterality Date  . CATARACT EXTRACTION  Bilateral   . COLONOSCOPY  11/1991, 06/2009  . ESOPHAGOGASTRODUODENOSCOPY  02/2002  . ESOPHAGOGASTRODUODENOSCOPY (EGD) WITH PROPOFOL N/A 06/20/2017   Procedure: ESOPHAGOGASTRODUODENOSCOPY (EGD) WITH PROPOFOL;  Surgeon: Gatha Mayer, MD;  Location: [facility];  Service: Endoscopy;  Laterality: N/A;  with Barrx  . EYE SURGERY    . NASAL SEPTOPLASTY W/ TURBINOPLASTY    . TONSILLECTOMY      OB History    No data available       Home Medications    Prior to Admission medications   Medication Sig Start Date End Date Taking? Authorizing Provider  ALPRAZolam Duanne Moron) 0.5 MG tablet Take 1 tablet (0.5 mg total) by mouth at bedtime as needed for anxiety. Patient taking differently: Take 0.25 mg by mouth daily as needed for anxiety.  04/01/16   Robyn Haber, MD  aspirin 325 MG tablet Take 650-975 mg by mouth daily as needed for moderate pain or headache.     [provider]  azelastine (OPTIVAR) 0.05 % ophthalmic solution instill 1 drop into affected eye DAILY Patient not taking: Reported on 07/12/2017 04/01/16   Robyn Haber, MD  blood glucose meter kit and supplies KIT Dispense based on patient and insurance preference. Use up to four times daily as directed. (FOR ICD-9 250.00, 250.01). 05/20/15   Robyn Haber, MD  budesonide (  ENTOCORT EC) 3 MG 24 hr capsule Take 3 capsules (9 mg total) daily by mouth. 04/26/17   Gatha Mayer, MD  cyclobenzaprine (FLEXERIL) 10 MG tablet take 1 tablet by mouth at bedtime if needed Patient taking differently: Take 5-10 mg by mouth daily as needed for muscle spasms.  04/01/16   Robyn Haber, MD  diphenhydrAMINE (BENADRYL) 25 mg capsule Take 25-50 mg by mouth daily as needed for allergies.     [provider]  diphenhydrAMINE-zinc acetate (BENADRYL) cream Apply 1 application topically daily as needed for itching.    [provider]  EFFEXOR XR 75 MG 24 hr capsule TAKE ONE CAPSULE BY MOUTH TWICE DAILY WITH A MEAL 06/28/17    Gatha Mayer, MD  EFFEXOR XR 75 MG 24 hr capsule TAKE ONE CAPSULE BY MOUTH TWICE DAILY WITH A MEAL 08/03/17   Gatha Mayer, MD  ferrous sulfate 325 (65 FE) MG tablet Take 1 tablet (325 mg total) by mouth daily with breakfast. Patient taking differently: Take 325 mg by mouth daily with lunch.  08/25/16   Gatha Mayer, MD  Fluticasone-Salmeterol (ADVAIR DISKUS) 100-50 MCG/DOSE AEPB Inhale 1 puff into the lungs 2 (two) times daily. Patient taking differently: Inhale 1 puff into the lungs daily as needed (shortness of breath).  04/01/16   Robyn Haber, MD  furosemide (LASIX) 20 MG tablet Take 1 tablet (20 mg total) by mouth daily as needed. Patient taking differently: Take 20 mg by mouth daily as needed for edema.  04/01/16   Robyn Haber, MD  glimepiride (AMARYL) 2 MG tablet TAKE 1 TABLET(2 MG) BY MOUTH DAILY BEFORE BREAKFAST 06/27/17   Elayne Snare, MD  glucose blood test strip Use as instructed to test once daily. 07/22/17   Elayne Snare, MD  lansoprazole (PREVACID) 30 MG capsule TAKE ONE CAPSULE BY MOUTH ONCE DAILY Patient taking differently: TAKE ONE CAPSULE BY MOUTH ONCE DAILY AT NIGHT 04/26/17   Gatha Mayer, MD  meloxicam (MOBIC) 7.5 MG tablet Take 1 tablet (7.5 mg total) by mouth daily. 08/21/17   Tasia Catchings, Osmani Kersten V, PA-C  metFORMIN (GLUCOPHAGE-XR) 500 MG 24 hr tablet TAKE 3 TABLETS(1500 MG) BY MOUTH DAILY WITH SUPPER 06/27/17   Elayne Snare, MD  montelukast (SINGULAIR) 10 MG tablet TAKE 1 TABLET BY MOUTH AT BEDTIME. Patient not taking: Reported on 07/12/2017 04/01/16   Robyn Haber, MD  Multiple Vitamins-Minerals (HAIR SKIN AND NAILS FORMULA) TABS Take 1 tablet by mouth daily.    [provider]  [facility] DELICA LANCETS 57D MISC USE UP TO QIF AS DIRECTED 02/12/17   Elayne Snare, MD  oxyCODONE-acetaminophen (PERCOCET/ROXICET) 5-325 MG tablet Take 0.5 tablets by mouth daily as needed for severe pain. 08/21/17   Tasia Catchings, Sharice Harriss V, PA-C  promethazine (PHENERGAN) 12.5 MG tablet Take 1 tablet  (12.5 mg total) by mouth every 8 (eight) hours as needed for nausea or vomiting. 04/01/16   Robyn Haber, MD  pseudoephedrine (SUDAFED) 30 MG tablet Take 30 mg by mouth daily as needed for congestion.     [provider]  TRULICITY 1.5 UK/0.2RK SOPN INJECT ONCE WEEKLY AS DIRECTED 08/15/17   Elayne Snare, MD    Family History Family History  Problem Relation Age of Onset  . Stroke Mother   . Hypertension Mother   . Colon cancer Father   . Liver cancer Father   . Heart disease Sister        hole in heart  . Stroke Maternal Grandmother   . Hypertension  Maternal Grandmother   . Esophageal cancer Maternal Grandfather   . Breast cancer Paternal Grandmother   . Brain cancer Paternal Grandmother   . Lung cancer Paternal Grandfather   . Diabetes Neg Hx     Social History Social History   Tobacco Use  . Smoking status: Never Smoker  . Smokeless tobacco: Never Used  Substance Use Topics  . Alcohol use: Yes  . Drug use: No     Allergies   Fentanyl; Levofloxacin; Prednisone; Amoxicillin; Augmentin [amoxicillin-pot clavulanate]; Farxiga [dapagliflozin]; and Statins   Review of Systems Review of Systems  Reason unable to perform ROS: See HPI as above.     Physical Exam Triage Vital Signs ED Triage Vitals  Enc Vitals Group     BP 08/21/17 1323 (!) 169/89     Pulse Rate 08/21/17 1323 (!) 106     Resp --      Temp 08/21/17 1323 99.7 F (37.6 C)     Temp Source 08/21/17 1323 Oral     SpO2 08/21/17 1323 96 %     Weight --      Height --      Head Circumference --      Peak Flow --      Pain Score 08/21/17 1320 10     Pain Loc --      Pain Edu? --      Excl. in [place]? --    No data found.  Updated Vital Signs BP (!) 169/89 (BP Location: Right Arm)   Pulse (!) 106   Temp 99.7 F (37.6 C) (Oral)   SpO2 96%   Physical Exam  Constitutional: She is oriented to person, place, and time. She appears well-developed and well-nourished. No distress.  HENT:  Head:  Normocephalic and atraumatic.  Eyes: Conjunctivae are normal. Pupils are equal, round, and reactive to light.  Musculoskeletal:  No swelling, erythema, increased warmth. ? Telangiectasia of the lower extremity bilaterally. Tenderness to palpation of right lateral knee anteriorly and joint line. Point tenderness at mid fibula. Tenderness on palpation of 4th and 5th MTP. No obvious tenderness to palpation of the ankle. Patient with full extension, decreased flexion of the knee. Full ROM of ankle, though with pain. Strength deferred. Sensation intact. Pedal pulses 2+ and equal bilaterally.   Neurological: She is alert and oriented to person, place, and time.     UC Treatments / Results  Labs (all labs ordered are listed, but only abnormal results are displayed) Labs Reviewed - No data to display  EKG  EKG Interpretation None       Radiology Dg Tibia/fibula Right  Result Date: 08/21/2017 CLINICAL DATA:  Fall last night. Pain right foot, mid right fibula area, rt knee pain.Fight foot injury and pain across distal metatarsal regions radiating laterally up to lateral malleolus. Unable to bear weight. Hx of fx of 3rd digit of rt foot.Rt fibular pain extending from lateral malleolus to medial aspect of fibula. Hx of lateral malleolar fx.Rt knee pain on lateral aspect, unable to bear weight, knee gives out when putting pressure on it. Hx of type 2 diabetes, controlled with medication. EXAM: RIGHT TIBIA AND FIBULA - 2 VIEW COMPARISON:  None. FINDINGS: There is a subtle cortical irregularity along the medial margin of the proximal fibula, just below the metadiaphysis, that suggests an incomplete fracture although this could be chronic. No other evidence of a fracture. The knee and ankle joints are normally aligned. Soft tissues are unremarkable. IMPRESSION: 1. Cortical irregularity  along the medial margin of the proximal fibula is consistent with an acute incomplete fracture if this correlates clinically.  It could be a chronic finding, however. 2. No other evidence of a fracture.  No dislocation. Electronically Signed   By: Lajean Manes M.D.   On: 08/21/2017 15:04   Dg Knee Complete 4 Views Right  Result Date: 08/21/2017 CLINICAL DATA:  Fall last night with mid right fibular pain. Initial encounter. EXAM: RIGHT KNEE - COMPLETE 4+ VIEW COMPARISON:  None. FINDINGS: There is a persisting medial cortical lucency along the medial upper fibula, presumed fracture in this setting. No malalignment. No knee joint effusion. IMPRESSION: Probable subtle cortex fracture of the upper fibular diaphysis. Electronically Signed   By: Monte Fantasia M.D.   On: 08/21/2017 15:05   Dg Foot Complete Right  Result Date: 08/21/2017 CLINICAL DATA:  Fall last night. Pain right foot, mid right fibula area, rt knee pain.Fight foot injury and pain across distal metatarsal regions radiating laterally up to lateral malleolus. Unable to bear weight. Hx of fx of 3rd digit of rt foot.Rt fibular pain extending from lateral malleolus to medial aspect of fibula. Hx of lateral malleolar fx.Rt knee pain on lateral aspect, unable to bear weight, knee gives out when putting pressure on it. Hx of type 2 diabetes, controlled with medication. EXAM: RIGHT FOOT COMPLETE - 3+ VIEW COMPARISON:  None. FINDINGS: No fracture.  No bone lesion. The joints are normally spaced and aligned. Soft tissues are unremarkable. IMPRESSION: Negative. Electronically Signed   By: Lajean Manes M.D.   On: 08/21/2017 15:05    Procedures Procedures (including critical care time)  Medications Ordered in UC Medications - No data to display   Initial Impression / Assessment and Plan / UC Course  I have reviewed the triage vital signs and the nursing notes.  Pertinent labs & imaging results that were available during my care of the patient were reviewed by me and considered in my medical decision making (see chart for details).    Knee immobilizer with crutches.  Patient to remain nonweight bearing until evaluation by orthopedics. Long discussion with patient that xray results does not rule out meniscus and ligament injury. Given patient with knee buckling during weight bearing, would like further evaluation by orthopedics. Mobic for pain, percocet as needed for severe pain. Follow up with orthopedics for further evaluation and treatment needed.   Final Clinical Impressions(s) / UC Diagnoses   Final diagnoses:  Closed fracture of proximal end of right fibula, unspecified fracture morphology, initial encounter    ED Discharge Orders        Ordered    oxyCODONE-acetaminophen (PERCOCET/ROXICET) 5-325 MG tablet  Daily PRN     08/21/17 1548    meloxicam (MOBIC) 7.5 MG tablet  Daily     08/21/17 1548       Controlled Substance Prescriptions [place] Controlled Substance Registry consulted? Yes, I have consulted the [place] Controlled Substances Registry for this patient, and feel the risk/benefit ratio today is favorable for proceeding with this prescription for a controlled substance.   Ok Edwards, PA-C 08/21/17 1932

## 2017-08-21 NOTE — Discharge Instructions (Signed)
X-ray showed possible fibular fracture that could be chronic in nature.  However, given recent injury, we will put you in a knee immobilizer and crutches until evaluated by orthopedics.  Please remain nonweightbearing until then.  Foot and knee x-ray negative for fracture or dislocation.  However this does not rule out meniscus( cartilage), or ligament injury.  Take Mobic as directed.  You can take Percocet for any breakthrough pain.  Follow-up with orthopedics on Monday for further evaluation and management needed.

## 2017-08-26 ENCOUNTER — Other Ambulatory Visit: Payer: Self-pay | Admitting: Endocrinology

## 2017-08-31 ENCOUNTER — Ambulatory Visit: Payer: 59 | Admitting: Internal Medicine

## 2017-09-01 ENCOUNTER — Other Ambulatory Visit: Payer: Self-pay

## 2017-09-01 MED ORDER — BUDESONIDE 3 MG PO CPEP: 9.0000 mg | ORAL_CAPSULE | Freq: Every day | ORAL | 3 refills | Status: DC

## 2017-09-01 NOTE — Telephone Encounter (Signed)
Budesonide refilled as requested by pharmacy. Patient up to date on her office visits.

## 2017-09-05 ENCOUNTER — Other Ambulatory Visit: Payer: 59

## 2017-09-08 ENCOUNTER — Ambulatory Visit: Payer: 59 | Admitting: Endocrinology

## 2017-09-08 ENCOUNTER — Other Ambulatory Visit: Payer: Self-pay | Admitting: Endocrinology

## 2017-09-08 ENCOUNTER — Telehealth: Payer: Self-pay | Admitting: Endocrinology

## 2017-09-08 NOTE — Telephone Encounter (Signed)
Patient had to reschedule today's appt (Patient fell and was injured)-I rescheduled her for 1st available which is 10/12/17. Pharmacy told patient today that Dr. Dwyane Dee closed her account from Dr. Dwyane Dee with the pharmacy. She was given no reason. Patient thought she had appt on April 17th when she called to reschedule but appt must not have gone through. Last time patient was seen she was told she would get a script for Ozempic but that script was never sent to her pharmacy, so she has been staying on Trulicity injections. Her Pharmacy is [facility] on [address]. Patient would like to stay on Trulicity until she sees Dr. Dwyane Dee at her next appt. Please send script for Trulicity with at least 2 refills on it to the above Pharmacy.

## 2017-09-09 ENCOUNTER — Other Ambulatory Visit: Payer: Self-pay

## 2017-09-09 MED ORDER — DULAGLUTIDE 1.5 MG/0.5ML ~~LOC~~ SOAJ: SUBCUTANEOUS | 0 refills | Status: DC

## 2017-09-09 NOTE — Telephone Encounter (Signed)
Done

## 2017-09-19 ENCOUNTER — Ambulatory Visit: Payer: 59 | Admitting: Family Medicine

## 2017-09-19 ENCOUNTER — Encounter: Payer: Self-pay | Admitting: Family Medicine

## 2017-09-19 VITALS — BP 120/68 | HR 102 | Temp 98.2°F | Ht 66.0 in | Wt 134.2 lb

## 2017-09-19 DIAGNOSIS — J40 Bronchitis, not specified as acute or chronic: Secondary | ICD-10-CM | POA: Diagnosis not present

## 2017-09-19 DIAGNOSIS — M545 Low back pain, unspecified: Secondary | ICD-10-CM

## 2017-09-19 DIAGNOSIS — J019 Acute sinusitis, unspecified: Secondary | ICD-10-CM | POA: Diagnosis not present

## 2017-09-19 MED ORDER — ALPRAZOLAM 0.5 MG PO TABS: 0.5000 mg | ORAL_TABLET | Freq: Every evening | ORAL | 0 refills | Status: DC | PRN

## 2017-09-19 MED ORDER — OXYCODONE-ACETAMINOPHEN 5-325 MG PO TABS: 0.5000 | ORAL_TABLET | Freq: Three times a day (TID) | ORAL | 0 refills | Status: DC | PRN

## 2017-09-19 MED ORDER — MONTELUKAST SODIUM 10 MG PO TABS: ORAL_TABLET | ORAL | 0 refills | Status: DC

## 2017-09-19 MED ORDER — AZITHROMYCIN 250 MG PO TABS: ORAL_TABLET | ORAL | 0 refills | Status: DC

## 2017-09-19 MED ORDER — AZELASTINE HCL 0.05 % OP SOLN
OPHTHALMIC | 0 refills | Status: DC
Start: 2017-09-19 — End: 2017-11-10

## 2017-09-19 NOTE — Progress Notes (Signed)
   Subjective:    [name], female    DOB: Jan 31, 1951, 67 y.o.   MRN: [id number]  HPI Here for 5 days of sinus pressure, PND, dry cough and hoarseness. No fever.    Review of Systems  Constitutional: Negative.   HENT: Positive for congestion, postnasal drip, sinus pressure, sore throat and voice change. Negative for sinus pain.   Eyes: Negative.   Respiratory: Positive for cough.        Objective:   Physical Exam  Constitutional: She appears well-developed and well-nourished.  HENT:  Right Ear: External ear normal.  Left Ear: External ear normal.  Nose: Nose normal.  Mouth/Throat: Oropharynx is clear and moist.  Voice is hoarse   Eyes: Conjunctivae are normal.  Neck: Neck supple. No thyromegaly present.  Pulmonary/Chest: Effort normal and breath sounds normal. No respiratory distress. She has no wheezes. She has no rales.  Lymphadenopathy:    She has no cervical adenopathy.          Assessment & Plan:  Sinusitis, treat with a Zpack. Alysia Penna, MD

## 2017-09-23 ENCOUNTER — Telehealth: Payer: Self-pay

## 2017-09-23 ENCOUNTER — Other Ambulatory Visit: Payer: Self-pay | Admitting: Endocrinology

## 2017-09-23 DIAGNOSIS — R52 Pain, unspecified: Secondary | ICD-10-CM

## 2017-09-23 MED ORDER — EFFEXOR XR 75 MG PO CP24: ORAL_CAPSULE | ORAL | 5 refills | Status: DC

## 2017-09-23 NOTE — Telephone Encounter (Signed)
Refill x 6 mos 

## 2017-09-23 NOTE — Telephone Encounter (Signed)
Effexor refill sent in to [facility] as approved.

## 2017-09-23 NOTE — Telephone Encounter (Signed)
Pharmacy refill request sent for Effexor XR, please advise Sir?

## 2017-09-26 ENCOUNTER — Other Ambulatory Visit: Payer: 59

## 2017-10-06 ENCOUNTER — Other Ambulatory Visit: Payer: Self-pay | Admitting: Endocrinology

## 2017-10-07 ENCOUNTER — Other Ambulatory Visit (INDEPENDENT_AMBULATORY_CARE_PROVIDER_SITE_OTHER): Payer: 59

## 2017-10-07 DIAGNOSIS — E1165 Type 2 diabetes mellitus with hyperglycemia: Secondary | ICD-10-CM | POA: Diagnosis not present

## 2017-10-07 DIAGNOSIS — IMO0001 Reserved for inherently not codable concepts without codable children: Secondary | ICD-10-CM

## 2017-10-07 LAB — BASIC METABOLIC PANEL
BUN: 11 mg/dL (ref 6–23)
CALCIUM: 9.1 mg/dL (ref 8.4–10.5)
CHLORIDE: 100 meq/L (ref 96–112)
CO2: 29 meq/L (ref 19–32)
CREATININE: 0.57 mg/dL (ref 0.40–1.20)
GFR: 112.98 mL/min (ref >60.00)
GLUCOSE: 125 mg/dL — AB (ref 70–99)
Potassium: 4 mEq/L (ref 3.5–5.1)
Sodium: 138 mEq/L (ref 135–145)

## 2017-10-08 LAB — FRUCTOSAMINE: Fructosamine: 269 umol/L (ref 0–285)

## 2017-10-11 ENCOUNTER — Ambulatory Visit: Payer: 59 | Admitting: Adult Health

## 2017-10-11 NOTE — Progress Notes (Deleted)
Patient presents to clinic today to establish care. 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  She is a previous patient of Almira Coaster, DNP     Acute Concerns: Establish Care   Chronic Issues: Diabetes Mellitus -she is followed by endocrinology, Dr. Dwyane Dee.  Current medication regimen includes Trulicity 1.5 mg weekly, metformin 500 XR mg TID, and Amaryl 2 mg daily. Lab Results  Component Value Date   HGBA1C 5.9 07/08/2017     Hyperlipidemia -has intolerance to multiple statins in the past.  Has tried Livalo and had a unusual side effect.  Currently not on any treatment Lab Results  Component Value Date   CHOL 246 (H) 02/22/2017   HDL 50.50 02/22/2017   LDLCALC 174 (H) 02/22/2017   TRIG 108.0 02/22/2017   CHOLHDL 5 02/22/2017   Hepatic cirrhosis -was seen on CT January 2018.  Is following with GI for this issue   Health Maintenance: Dental -- Vision -- Immunizations -- Colonoscopy -- UTD Mammogram -- UTD PAP --  Bone Density --   Treatment Team  1) Endocrinology - Dr. Dwyane Dee  2) GI - Dr. Carlean Purl   Past Medical History:  Diagnosis Date  . Allergy   . Anxiety   . Asthma   . Autoimmune hepatitis ([place]) 08/18/2016   08/2016 liver bx confirms, fibrosis not cirrhosis  . Depression   . DM (diabetes mellitus) ([place])   . 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 GERD (gastroesophageal reflux disease)   . Hiatal hernia   . Hx of adenomatous polyp of colon 07/07/2009  .  Hyperlipidemia   . Hypertension   . IBS (irritable bowel syndrome)   . Iron deficiency anemia   . Sinusitis, chronic   . Thyroid nodule   . Umbilical hernia   . Uterine fibroid   . Vascular disease   . Vitamin D deficiency     Past Surgical History:  Procedure Laterality Date  . CATARACT EXTRACTION Bilateral   . COLONOSCOPY  11/1991, 06/2009  . ESOPHAGOGASTRODUODENOSCOPY  02/2002  . ESOPHAGOGASTRODUODENOSCOPY (EGD) WITH PROPOFOL N/A 06/20/2017   Procedure: ESOPHAGOGASTRODUODENOSCOPY (EGD) WITH PROPOFOL;  Surgeon: Gatha Mayer, MD;  Location: [facility];  Service: Endoscopy;  Laterality: N/A;  with Barrx  . EYE SURGERY    . NASAL SEPTOPLASTY W/ TURBINOPLASTY    . TONSILLECTOMY      Current Outpatient Medications on File Prior to Visit  Medication Sig Dispense Refill  . ALPRAZolam (XANAX) 0.5 MG tablet Take 1 tablet (0.5 mg total) by mouth at bedtime as needed for anxiety. 90 tablet 0  . aspirin 325 MG tablet Take 650-975 mg by mouth daily as needed for moderate pain or headache.     Marland Kitchen azelastine (OPTIVAR) 0.05 % ophthalmic solution instill 1 drop into affected eye DAILY 6 mL 0  . azithromycin (ZITHROMAX) 250 MG tablet As directed 6 tablet 0  . blood glucose meter kit and supplies KIT Dispense based on patient and insurance preference. Use up to four times daily as directed. (FOR ICD-9 250.00, 250.01). 1 each 0  . budesonide (ENTOCORT EC) 3 MG 24 hr capsule Take 3  capsules (9 mg total) by mouth daily. 90 capsule 3  . cyclobenzaprine (FLEXERIL) 10 MG tablet take 1 tablet by mouth at bedtime if needed (Patient taking differently: Take 5-10 mg by mouth daily as needed for muscle spasms. ) 30 tablet 5  . diphenhydrAMINE (BENADRYL) 25 mg capsule Take 25-50 mg by mouth daily as needed for allergies.     . diphenhydrAMINE-zinc acetate (BENADRYL) cream Apply 1 application topically daily as needed for itching.    . Dulaglutide (TRULICITY) 1.5 ZO/1.0RU SOPN INJECT ONCE WEEKLY AS DIRECTED 2 mL  0  . EFFEXOR XR 75 MG 24 hr capsule TAKE ONE CAPSULE BY MOUTH TWICE DAILY WITH A MEAL 60 capsule 5  . ferrous sulfate 325 (65 FE) MG tablet Take 1 tablet (325 mg total) by mouth daily with breakfast. (Patient taking differently: Take 325 mg by mouth daily with lunch. )  3  . Fluticasone-Salmeterol (ADVAIR DISKUS) 100-50 MCG/DOSE AEPB Inhale 1 puff into the lungs 2 (two) times daily. (Patient taking differently: Inhale 1 puff into the lungs daily as needed (shortness of breath). ) 60 each 11  . furosemide (LASIX) 20 MG tablet Take 1 tablet (20 mg total) by mouth daily as needed. (Patient taking differently: Take 20 mg by mouth daily as needed for edema. ) 30 tablet 11  . glimepiride (AMARYL) 2 MG tablet TAKE 1 TABLET(2 MG) BY MOUTH DAILY BEFORE BREAKFAST 30 tablet 0  . glucose blood test strip Use as instructed to test once daily. 100 each 12  . lansoprazole (PREVACID) 30 MG capsule TAKE ONE CAPSULE BY MOUTH ONCE DAILY (Patient taking differently: TAKE ONE CAPSULE BY MOUTH ONCE DAILY AT NIGHT) 210 capsule 0  . metFORMIN (GLUCOPHAGE-XR) 500 MG 24 hr tablet TAKE 3 TABLETS(1500 MG) BY MOUTH DAILY WITH SUPPER 90 tablet 0  . montelukast (SINGULAIR) 10 MG tablet TAKE 1 TABLET BY MOUTH AT BEDTIME. 90 tablet 0  . Multiple Vitamins-Minerals (HAIR SKIN AND NAILS FORMULA) TABS Take 1 tablet by mouth daily.    Glory Rosebush DELICA LANCETS 04V MISC USE UP TO QIF AS DIRECTED 100 each 0  . oxyCODONE-acetaminophen (PERCOCET/ROXICET) 5-325 MG tablet Take 0.5 tablets by mouth every 8 (eight) hours as needed for severe pain. 30 tablet 0  . phenylephrine (SUDAFED PE) 10 MG TABS tablet Take 10 mg by mouth every 4 (four) hours as needed.    . promethazine (PHENERGAN) 12.5 MG tablet Take 1 tablet (12.5 mg total) by mouth every 8 (eight) hours as needed for nausea or vomiting. 20 tablet 11  . pseudoephedrine (SUDAFED) 30 MG tablet Take 30 mg by mouth daily as needed for congestion.     . sodium chloride (BRONCHO SALINE) inhaler  solution Take 1 spray by nebulization as needed.    . TRULICITY 1.5 WU/9.8JX SOPN INJECT ONCE WEEKLY AS DIRECTED 2 mL 0   No current facility-administered medications on file prior to visit.     Allergies  Allergen Reactions  . Fentanyl Anxiety    Made her feel very anxious, requests not to get it.  . Levofloxacin Anxiety    Weird dreams   . Prednisone     Anxiety and paranoia  . Amoxicillin Diarrhea    Has patient had a PCN reaction causing immediate rash, facial/tongue/throat swelling, SOB or lightheadedness with hypotension: No Has patient had a PCN reaction causing severe rash involving mucus membranes or skin necrosis: No Has patient had a PCN reaction that required hospitalization: No Has patient had a PCN reaction occurring  within the last 10 years: Yes If all of the above answers are "NO", then may proceed with Cephalosporin use.   . Augmentin [Amoxicillin-Pot Clavulanate] Other (See Comments)    diarrhea  . Wilder Glade [Dapagliflozin] Diarrhea    Gi pain  . Statins     Myalgia     Family History  Problem Relation Age of Onset  . Stroke Mother   . Hypertension Mother   . Colon cancer Father   . Liver cancer Father   . Heart disease Sister        hole in heart  . Stroke Maternal Grandmother   . Hypertension Maternal Grandmother   . Esophageal cancer Maternal Grandfather   . Breast cancer Paternal Grandmother   . Brain cancer Paternal Grandmother   . Lung cancer Paternal Grandfather   . Diabetes Neg Hx     Social History   Socioeconomic History  . Marital status: Divorced    Spouse name: Not on file  . Number of children: 0  . Years of education: Not on file  . Highest education level: Not on file  Occupational History  . Occupation: Airline pilot    Comment: [employer]  Social Needs  . Financial resource strain: Not on file  . Food insecurity:    Worry: Not on file    Inability: Not on file  . Transportation needs:    Medical: Not on  file    Non-medical: Not on file  Tobacco Use  . Smoking status: Never Smoker  . Smokeless tobacco: Never Used  Substance and Sexual Activity  . Alcohol use: Yes  . Drug use: No  . Sexual activity: Not on file  Lifestyle  . Physical activity:    Days per week: Not on file    Minutes per session: Not on file  . Stress: Not on file  Relationships  . Social connections:    Talks on phone: Not on file    Gets together: Not on file    Attends religious service: Not on file    Active member of club or organization: Not on file    Attends meetings of clubs or organizations: Not on file    Relationship status: Not on file  . Intimate partner violence:    Fear of current or ex partner: Not on file    Emotionally abused: Not on file    Physically abused: Not on file    Forced sexual activity: Not on file  Other Topics Concern  . 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  Physical Exam  Recent Results (from the past 2160 hour(s))  Fructosamine     Status: None   Collection Time: 10/07/17  9:26 AM  Result Value Ref Range   Fructosamine 269 0 - 285 umol/L    Comment: Published reference interval for apparently healthy subjects between age 68 and 110 is 18 - 285 umol/L and in a poorly controlled diabetic population is 228 - 563 umol/L with a mean of 396 umol/L.   Basic metabolic panel     Status: Abnormal   Collection Time: 10/07/17  9:26 AM  Result Value Ref Range   Sodium 138 135 - 145 mEq/L   Potassium 4.0 3.5 - 5.1 mEq/L   Chloride 100 96 - 112 mEq/L   CO2 29 19 - 32  mEq/L   Glucose, Bld 125 (H) 70 - 99 mg/dL   BUN 11 6 - 23 mg/dL   Creatinine, Ser 0.57 0.40 - 1.20 mg/dL   Calcium 9.1 8.4 - 10.5 mg/dL   GFR 112.98 >60.00 mL/min    Assessment/Plan: No problem-specific Assessment & Plan notes found for this encounter.

## 2017-10-12 ENCOUNTER — Ambulatory Visit: Payer: 59 | Admitting: Endocrinology

## 2017-10-12 ENCOUNTER — Encounter: Payer: Self-pay | Admitting: Endocrinology

## 2017-10-12 VITALS — BP 140/90 | HR 92 | Ht 66.0 in | Wt 136.0 lb

## 2017-10-12 DIAGNOSIS — E1165 Type 2 diabetes mellitus with hyperglycemia: Secondary | ICD-10-CM

## 2017-10-12 NOTE — Progress Notes (Signed)
[name], female   DOB: November 20, 1950, 67 y.o.   MRN: [id number]          Reason for Appointment: Follow-up for Type 2 Diabetes    History of Present Illness:          Date of diagnosis of type 2 diabetes mellitus: ?  2013        Background history:   She had a blood sugar over 200 in 2013 but was not told to have diabetes until 04/2015 A1c was 7.3 in 3/16 In 04/2015 blood sugar was 478 says her blood sugar was 600 when she was diagnosed and was not having any significant symptoms Initially treated with Metaglip but this apparently caused abdominal discomfort and she was switched to metformin In 2017 she had been tried on Bydureon for better control but she stopped this after a few weeks because of large persisted nodules in her skin She thinks her blood sugars were better with this and A1c was down to 6.8  Recent history:   Non-insulin hypoglycemic drugs the patient is taking are: Trulicity 1.5 mg weekly and metformin 1500 mg, Amaryl none  Her A1c is again only 5.9, previously 6.3, has been as high as 8.3 previously However her fructosamine is better at 266 now  Current management, blood sugar patterns and problems identified:  She stopped taking Amaryl couple of weeks ago because she felt it was causing her to have nausea and her nausea is better  She is trying to take 1500 mg of metformin ER a day but does still get some abdominal discomfort with this  She has tried to give up on ice tea a little better and drink more water but not consistently  Occasionally will still get into sweets with highest blood sugar 297  She thinks that higher readings may be related to carbohydrate intake or stress  Her weight is slightly less  Lab glucose was 125 fasting but she has had a reading of 188 also  Recently trying to increase her walking        Side effects from medications have been: Metaglip caused abdominal pain, Tradjenta caused swelling of the legs, Bydureon  caused skin nodules, Farxiga caused diarrhea  Compliance with the medical regimen: Inconsistent  Glucose monitoring:  done less than 1 times a day         Glucometer: One Touch.      Blood Glucose readings by time of day   Mean values apply above for all meters except median for One Touch  PRE-MEAL Fasting Lunch Dinner Bedtime Overall  Glucose range:  131-188      Mean/median:     161   POST-MEAL PC Breakfast PC Lunch PC Dinner  Glucose range:  140-297   137-252  Mean/median:      Previously  Mean values apply above for all meters except median for One Touch  PRE-MEAL morning Lunch Dinner Bedtime Overall  Glucose range: 90-255  202 136-388   Mean/median: 174    202    Self-care:  Typical meal intake: Breakfast is Oatmeal, usually eating low-fat dinner and her snacks will be fruit, peanut butter crackers                Dietician visit, most recent:None               Exercise: walking some  Weight history:  Wt Readings from Last 3 Encounters:  10/12/17 136 lb (61.7 kg)  09/19/17 134 lb  3.2 oz (60.9 kg)  07/12/17 138 lb 12.8 oz (63 kg)    Glycemic control:   Lab Results  Component Value Date   HGBA1C 5.9 07/08/2017   HGBA1C 5.9 02/22/2017   HGBA1C 6.3 11/19/2016   Lab Results  Component Value Date   MICROALBUR 8.2 (H) 07/12/2017   LDLCALC 174 (H) 02/22/2017   CREATININE 0.57 10/07/2017   Lab Results  Component Value Date   MICRALBCREAT 14.7 07/12/2017    Lab Results  Component Value Date   FRUCTOSAMINE 269 10/07/2017   FRUCTOSAMINE 266 07/08/2017   FRUCTOSAMINE 352 (H) 10/08/2016    Lab on 10/07/2017  Component Date Value Ref Range Status  . Fructosamine 10/07/2017 269  0 - 285 umol/L Final   Comment: Published reference interval for apparently healthy subjects between age 55 and 70 is 33 - 285 umol/L and in a poorly controlled diabetic population is 228 - 563 umol/L with a mean of 396 umol/L.   Marland Kitchen Sodium 10/07/2017 138  135 - 145 mEq/L Final    . Potassium 10/07/2017 4.0  3.5 - 5.1 mEq/L Final  . Chloride 10/07/2017 100  96 - 112 mEq/L Final  . CO2 10/07/2017 29  19 - 32 mEq/L Final  . Glucose, Bld 10/07/2017 125* 70 - 99 mg/dL Final  . BUN 10/07/2017 11  6 - 23 mg/dL Final  . Creatinine, Ser 10/07/2017 0.57  0.40 - 1.20 mg/dL Final  . Calcium 10/07/2017 9.1  8.4 - 10.5 mg/dL Final  . GFR 10/07/2017 112.98  >60.00 mL/min Final     Allergies as of 10/12/2017      Reactions   Fentanyl Anxiety   Made her feel very anxious, requests not to get it.   Levofloxacin Anxiety   Weird dreams    Prednisone    Anxiety and paranoia   Amoxicillin Diarrhea   Has patient had a PCN reaction causing immediate rash, facial/tongue/throat swelling, SOB or lightheadedness with hypotension: No Has patient had a PCN reaction causing severe rash involving mucus membranes or skin necrosis: No Has patient had a PCN reaction that required hospitalization: No Has patient had a PCN reaction occurring within the last 10 years: Yes If all of the above answers are "NO", then may proceed with Cephalosporin use.   Augmentin [amoxicillin-pot Clavulanate] Other (See Comments)   diarrhea   Farxiga [dapagliflozin] Diarrhea   Gi pain   Statins    Myalgia       Medication List        Accurate as of 10/12/17  2:37 PM. Always use your most recent med list.          ALPRAZolam 0.5 MG tablet Commonly known as:  XANAX Take 1 tablet (0.5 mg total) by mouth at bedtime as needed for anxiety.   aspirin 325 MG tablet Take 650-975 mg by mouth daily as needed for moderate pain or headache.   azelastine 0.05 % ophthalmic solution Commonly known as:  OPTIVAR instill 1 drop into affected eye DAILY   blood glucose meter kit and supplies Kit Dispense based on patient and insurance preference. Use up to four times daily as directed. (FOR ICD-9 250.00, 250.01).   budesonide 3 MG 24 hr capsule Commonly known as:  ENTOCORT EC Take 3 capsules (9 mg total) by mouth  daily.   cyclobenzaprine 10 MG tablet Commonly known as:  FLEXERIL take 1 tablet by mouth at bedtime if needed   diphenhydrAMINE 25 mg capsule Commonly known as:  BENADRYL Take  25-50 mg by mouth daily as needed for allergies.   diphenhydrAMINE-zinc acetate cream Commonly known as:  BENADRYL Apply 1 application topically daily as needed for itching.   Dulaglutide 1.5 MG/0.5ML Sopn Commonly known as:  TRULICITY INJECT ONCE WEEKLY AS DIRECTED   EFFEXOR XR 75 MG 24 hr capsule Generic drug:  venlafaxine XR TAKE ONE CAPSULE BY MOUTH TWICE DAILY WITH A MEAL   ferrous sulfate 325 (65 FE) MG tablet Take 1 tablet (325 mg total) by mouth daily with breakfast.   Fluticasone-Salmeterol 100-50 MCG/DOSE Aepb Commonly known as:  ADVAIR DISKUS Inhale 1 puff into the lungs 2 (two) times daily.   furosemide 20 MG tablet Commonly known as:  LASIX Take 1 tablet (20 mg total) by mouth daily as needed.   glucose blood test strip Use as instructed to test once daily.   HAIR SKIN AND NAILS FORMULA Tabs Take 1 tablet by mouth daily.   lansoprazole 30 MG capsule Commonly known as:  PREVACID TAKE ONE CAPSULE BY MOUTH ONCE DAILY   metFORMIN 500 MG 24 hr tablet Commonly known as:  GLUCOPHAGE-XR TAKE 3 TABLETS(1500 MG) BY MOUTH DAILY WITH SUPPER   montelukast 10 MG tablet Commonly known as:  SINGULAIR TAKE 1 TABLET BY MOUTH AT BEDTIME.   ONETOUCH DELICA LANCETS 41Y Misc USE UP TO QIF AS DIRECTED   oxyCODONE-acetaminophen 5-325 MG tablet Commonly known as:  PERCOCET/ROXICET Take 0.5 tablets by mouth every 8 (eight) hours as needed for severe pain.   phenylephrine 10 MG Tabs tablet Commonly known as:  SUDAFED PE Take 10 mg by mouth every 4 (four) hours as needed.   promethazine 12.5 MG tablet Commonly known as:  PHENERGAN Take 1 tablet (12.5 mg total) by mouth every 8 (eight) hours as needed for nausea or vomiting.   pseudoephedrine 30 MG tablet Commonly known as:  SUDAFED Take 30  mg by mouth daily as needed for congestion.   sodium chloride inhaler solution Commonly known as:  BRONCHO SALINE Take 1 spray by nebulization as needed.       Allergies:  Allergies  Allergen Reactions  . Fentanyl Anxiety    Made her feel very anxious, requests not to get it.  . Levofloxacin Anxiety    Weird dreams   . Prednisone     Anxiety and paranoia  . Amoxicillin Diarrhea    Has patient had a PCN reaction causing immediate rash, facial/tongue/throat swelling, SOB or lightheadedness with hypotension: No Has patient had a PCN reaction causing severe rash involving mucus membranes or skin necrosis: No Has patient had a PCN reaction that required hospitalization: No Has patient had a PCN reaction occurring within the last 10 years: Yes If all of the above answers are "NO", then may proceed with Cephalosporin use.   . Augmentin [Amoxicillin-Pot Clavulanate] Other (See Comments)    diarrhea  . Wilder Glade [Dapagliflozin] Diarrhea    Gi pain  . Statins     Myalgia     Past Medical History:  Diagnosis Date  . Allergy   . Anxiety   . Asthma   . Autoimmune hepatitis ([place]) 08/18/2016   08/2016 liver bx confirms, fibrosis not cirrhosis  . Depression   . DM (diabetes mellitus) ([place])   . 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 GERD (gastroesophageal reflux disease)   . Hiatal hernia   . Hx of adenomatous polyp of colon 07/07/2009  . Hyperlipidemia   . Hypertension   . IBS (irritable bowel syndrome)   .  Iron deficiency anemia   . Sinusitis, chronic   . Thyroid nodule   . Umbilical hernia   . Uterine fibroid   . Vascular disease   . Vitamin D deficiency     Past Surgical History:  Procedure Laterality Date  . CATARACT EXTRACTION Bilateral   . COLONOSCOPY  11/1991, 06/2009  . ESOPHAGOGASTRODUODENOSCOPY  02/2002  . ESOPHAGOGASTRODUODENOSCOPY (EGD) WITH PROPOFOL N/A 06/20/2017   Procedure: ESOPHAGOGASTRODUODENOSCOPY (EGD) WITH PROPOFOL;  Surgeon: Gatha Mayer,  MD;  Location: [facility];  Service: Endoscopy;  Laterality: N/A;  with Barrx  . EYE SURGERY    . NASAL SEPTOPLASTY W/ TURBINOPLASTY    . TONSILLECTOMY      Family History  Problem Relation Age of Onset  . Stroke Mother   . Hypertension Mother   . Colon cancer Father   . Liver cancer Father   . Heart disease Sister        hole in heart  . Stroke Maternal Grandmother   . Hypertension Maternal Grandmother   . Esophageal cancer Maternal Grandfather   . Breast cancer Paternal Grandmother   . Brain cancer Paternal Grandmother   . Lung cancer Paternal Grandfather   . Diabetes Neg Hx     Social History:  reports that she has never smoked. She has never used smokeless tobacco. She reports that she drinks alcohol. She reports that she does not use drugs.   Review of Systems  She is asking about spider veins and red patches on her feet which tend to blanch  Lipid history: Previously had tried Zocor which apparently caused muscle aches, she took Crestor and she thinks generic causes muscle cramps  She took Livalo for a couple of days but she thinks she didn't feel well with this and had some palpitations and does not want to take it again Currently on no treatment    Lab Results  Component Value Date   CHOL 246 (H) 02/22/2017   HDL 50.50 02/22/2017   LDLCALC 174 (H) 02/22/2017   TRIG 108.0 02/22/2017   CHOLHDL 5 02/22/2017           Lab Results  Component Value Date   ALT 27 07/08/2017    Hypertension: None, she is taking Maxzide because of tendency to swelling in her abdomen Blood pressure is variably high, followed by PCP   BP Readings from Last 3 Encounters:  10/12/17 140/90  09/19/17 120/68  08/21/17 (!) 169/89     Most recent eye exam was 7/17  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 Fructosamine 10/07/2017 269  0 - 285 umol/L Final   Comment:  Published reference interval for apparently healthy subjects between age 23 and 45 is 49 - 285 umol/L and in a poorly controlled diabetic population is 228 - 563 umol/L with a mean of 396 umol/L.   Marland Kitchen Sodium 10/07/2017 138  135 - 145 mEq/L Final  . Potassium 10/07/2017 4.0  3.5 - 5.1 mEq/L Final  . Chloride 10/07/2017 100  96 - 112 mEq/L Final  . CO2 10/07/2017 29  19 - 32 mEq/L Final  . Glucose, Bld 10/07/2017 125* 70 - 99 mg/dL Final  . BUN 10/07/2017 11  6 - 23 mg/dL Final  . Creatinine, Ser 10/07/2017 0.57  0.40 - 1.20 mg/dL Final  . Calcium 10/07/2017 9.1  8.4 - 10.5 mg/dL Final  .  GFR 10/07/2017 112.98  >60.00 mL/min Final    Physical Examination:  BP 140/90 (BP Location: Left Arm, Patient Position: Sitting, Cuff Size: Normal)   Pulse 92   Ht '5\' 6"'  (1.676 m)   Wt 136 lb (61.7 kg)   SpO2 98%   BMI 21.95 kg/m          ASSESSMENT:  Diabetes type 2, normal BMI of 23     See history of present illness for detailed discussion of current diabetes management, blood sugar patterns and problems identified  A1c has been usually lower than expected, last 5.9 Fructosamine is 269 and relatively stable indicating overall better control  Most of her high readings are related to her getting sweets, sweet drinks or sometimes high carbohydrate meals Also she thinks stress can make her sugar go up However checking blood sugars somewhat sporadically Even though she has an occasional high reading in the morning her lab glucose was only 125 This is even with her stopping Amaryl on her own due to nausea   PLAN:   More consistent glucose monitoring including after meals Increase activity level If she has consistently high readings especially fasting will try her on glipizide ER Stay on Trulicity since her overall sugar seems to be relatively better but consider switching to Ozempic on the next visit Discussed blood sugar targets both fasting and after meals   There are no Patient  Instructions on file for this visit.    Elayne Snare 10/12/2017, 2:37 PM   Note: This office note was prepared with Dragon voice recognition system technology. Any transcriptional errors that result from this process are unintentional.

## 2017-10-27 ENCOUNTER — Other Ambulatory Visit: Payer: Self-pay | Admitting: Internal Medicine

## 2017-10-27 ENCOUNTER — Other Ambulatory Visit (INDEPENDENT_AMBULATORY_CARE_PROVIDER_SITE_OTHER): Payer: 59

## 2017-10-27 DIAGNOSIS — K754 Autoimmune hepatitis: Secondary | ICD-10-CM | POA: Diagnosis not present

## 2017-10-27 DIAGNOSIS — D500 Iron deficiency anemia secondary to blood loss (chronic): Secondary | ICD-10-CM

## 2017-10-27 LAB — CBC WITH DIFFERENTIAL/PLATELET
BASOS ABS: 0 10*3/uL (ref 0.0–0.1)
Basophils Relative: 0.5 % (ref 0.0–3.0)
Eosinophils Absolute: 0.1 10*3/uL (ref 0.0–0.7)
Eosinophils Relative: 1.6 % (ref 0.0–5.0)
HEMATOCRIT: 35.3 % — AB (ref 36.0–46.0)
Hemoglobin: 11.6 g/dL — ABNORMAL LOW (ref 12.0–15.0)
LYMPHS ABS: 2.5 10*3/uL (ref 0.7–4.0)
LYMPHS PCT: 31.4 % (ref 12.0–46.0)
MCHC: 32.9 g/dL (ref 30.0–36.0)
MCV: 89 fl (ref 78.0–100.0)
MONOS PCT: 9.2 % (ref 3.0–12.0)
Monocytes Absolute: 0.7 10*3/uL (ref 0.1–1.0)
NEUTROS PCT: 57.3 % (ref 43.0–77.0)
Neutro Abs: 4.6 10*3/uL (ref 1.4–7.7)
Platelets: 170 10*3/uL (ref 150.0–400.0)
RBC: 3.97 Mil/uL (ref 3.87–5.11)
RDW: 15.5 % (ref 11.5–15.5)
WBC: 8 10*3/uL (ref 4.0–10.5)

## 2017-10-27 LAB — HEPATIC FUNCTION PANEL
ALBUMIN: 3.6 g/dL (ref 3.5–5.2)
ALT: 25 U/L (ref 0–35)
AST: 28 U/L (ref 0–37)
Alkaline Phosphatase: 90 U/L (ref 39–117)
Bilirubin, Direct: 0.1 mg/dL (ref 0.0–0.3)
TOTAL PROTEIN: 7.2 g/dL (ref 6.0–8.3)
Total Bilirubin: 0.7 mg/dL (ref 0.2–1.2)

## 2017-10-27 LAB — FERRITIN: Ferritin: 6.3 ng/mL — ABNORMAL LOW (ref 10.0–291.0)

## 2017-11-02 ENCOUNTER — Encounter: Payer: Self-pay | Admitting: Internal Medicine

## 2017-11-02 ENCOUNTER — Ambulatory Visit: Payer: 59 | Admitting: Internal Medicine

## 2017-11-02 VITALS — BP 120/82 | HR 88 | Ht 66.0 in | Wt 133.0 lb

## 2017-11-02 DIAGNOSIS — Z7952 Long term (current) use of systemic steroids: Secondary | ICD-10-CM

## 2017-11-02 DIAGNOSIS — K754 Autoimmune hepatitis: Secondary | ICD-10-CM

## 2017-11-02 DIAGNOSIS — D500 Iron deficiency anemia secondary to blood loss (chronic): Secondary | ICD-10-CM

## 2017-11-02 DIAGNOSIS — K31819 Angiodysplasia of stomach and duodenum without bleeding: Secondary | ICD-10-CM | POA: Diagnosis not present

## 2017-11-02 MED ORDER — FUROSEMIDE 20 MG PO TABS: 20.0000 mg | ORAL_TABLET | Freq: Every day | ORAL | 5 refills | Status: DC | PRN
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
## 2017-11-02 NOTE — Patient Instructions (Addendum)
  Please come back in a month for blood work, no appointment needed.  Decrease your budesonide to 2 pills once daily.   Increase your iron to twice a day.   Follow up in 6 months with Dr Carlean Purl.  Call in September for a November appointment.   We have sent the following medications to your pharmacy for you to pick up at your convenience: Generic lasix    I appreciate the opportunity to care for you. Silvano Rusk, MD, [facility]

## 2017-11-02 NOTE — Assessment & Plan Note (Signed)
I once again reviewed the potential side effects with particular emphasis on bone loss.  She does not want to do a DEXA scan.  I explained the risk of hip fracture etc. it is increased.  She is comfortable with this risk and does not want to pursue other immunosuppressants for her autoimmune hepatitis.

## 2017-11-02 NOTE — Assessment & Plan Note (Signed)
Hemoglobin stable iron remains low hemoglobin nearly normal continue oral iron therapy because she had nausea and vomiting the day after her Feraheme and she is gun shy about trying that again.  I am not convinced that was an allergic reaction but it happened the day after and she does not want to repeat Feraheme.

## 2017-11-02 NOTE — Assessment & Plan Note (Signed)
Biochemical remission on budesonide 9 mg daily. We will again try to go to 6 mg on the budesonide, I have not changed her prescription but she will take 2 a day.  LFT follow-up in 1 month

## 2017-11-02 NOTE — Assessment & Plan Note (Signed)
Ablated with RFA in January 2019.  Observe ferritin hemoglobin consider repeat pending clinical course.

## 2017-11-04 ENCOUNTER — Other Ambulatory Visit: Payer: Self-pay | Admitting: Endocrinology

## 2017-11-10 ENCOUNTER — Other Ambulatory Visit: Payer: Self-pay | Admitting: Family Medicine

## 2017-11-10 DIAGNOSIS — J40 Bronchitis, not specified as acute or chronic: Secondary | ICD-10-CM

## 2017-11-11 NOTE — Telephone Encounter (Signed)
Last OV 11/02/2017   Last refilled

## 2017-12-05 ENCOUNTER — Other Ambulatory Visit (INDEPENDENT_AMBULATORY_CARE_PROVIDER_SITE_OTHER): Payer: 59

## 2017-12-05 DIAGNOSIS — D500 Iron deficiency anemia secondary to blood loss (chronic): Secondary | ICD-10-CM | POA: Diagnosis not present

## 2017-12-05 DIAGNOSIS — K754 Autoimmune hepatitis: Secondary | ICD-10-CM | POA: Diagnosis not present

## 2017-12-05 LAB — HEPATIC FUNCTION PANEL
ALBUMIN: 4 g/dL (ref 3.5–5.2)
ALT: 34 U/L (ref 0–35)
AST: 40 U/L — AB (ref 0–37)
Alkaline Phosphatase: 98 U/L (ref 39–117)
Bilirubin, Direct: 0.1 mg/dL (ref 0.0–0.3)
Total Bilirubin: 0.7 mg/dL (ref 0.2–1.2)
Total Protein: 7.8 g/dL (ref 6.0–8.3)

## 2017-12-05 LAB — CBC WITH DIFFERENTIAL/PLATELET
BASOS ABS: 0.1 10*3/uL (ref 0.0–0.1)
Basophils Relative: 0.8 % (ref 0.0–3.0)
Eosinophils Absolute: 0.3 10*3/uL (ref 0.0–0.7)
Eosinophils Relative: 2.9 % (ref 0.0–5.0)
HCT: 40.6 % (ref 36.0–46.0)
Hemoglobin: 13.6 g/dL (ref 12.0–15.0)
LYMPHS ABS: 4 10*3/uL (ref 0.7–4.0)
Lymphocytes Relative: 38.8 % (ref 12.0–46.0)
MCHC: 33.5 g/dL (ref 30.0–36.0)
MCV: 92.8 fl (ref 78.0–100.0)
MONO ABS: 0.8 10*3/uL (ref 0.1–1.0)
MONOS PCT: 7.9 % (ref 3.0–12.0)
NEUTROS PCT: 49.6 % (ref 43.0–77.0)
Neutro Abs: 5.2 10*3/uL (ref 1.4–7.7)
Platelets: 211 10*3/uL (ref 150.0–400.0)
RBC: 4.37 Mil/uL (ref 3.87–5.11)
RDW: 18.5 % — ABNORMAL HIGH (ref 11.5–15.5)
WBC: 10.4 10*3/uL (ref 4.0–10.5)

## 2017-12-05 LAB — FERRITIN: Ferritin: 25.4 ng/mL (ref 10.0–291.0)

## 2017-12-06 ENCOUNTER — Other Ambulatory Visit: Payer: Self-pay | Admitting: Endocrinology

## 2017-12-06 ENCOUNTER — Other Ambulatory Visit: Payer: Self-pay | Admitting: Internal Medicine

## 2017-12-06 ENCOUNTER — Telehealth: Payer: Self-pay

## 2017-12-06 DIAGNOSIS — K754 Autoimmune hepatitis: Secondary | ICD-10-CM

## 2017-12-06 DIAGNOSIS — K219 Gastro-esophageal reflux disease without esophagitis: Secondary | ICD-10-CM

## 2017-12-06 MED ORDER — BUDESONIDE 3 MG PO CPEP: 9.0000 mg | ORAL_CAPSULE | Freq: Every day | ORAL | Status: DC

## 2017-12-06 MED ORDER — LANSOPRAZOLE 30 MG PO CPDR: 30.0000 mg | DELAYED_RELEASE_CAPSULE | Freq: Every day | ORAL | 1 refills | Status: DC

## 2017-12-06 NOTE — Progress Notes (Signed)
AST sl elevated at 40 Ferritin better  Hgb NL  Plan: Seems to do better at 9 mg budesonide so go back to 9 mg daily Stay on ferrous sulfate  Recheck LFT early August  My Chart message to patient

## 2017-12-06 NOTE — Telephone Encounter (Signed)
Lansoprazole 30mg  capsules refilled as requested by pharmacy.

## 2017-12-26 ENCOUNTER — Other Ambulatory Visit: Payer: Self-pay

## 2017-12-26 MED ORDER — BUDESONIDE 3 MG PO CPEP: 9.0000 mg | ORAL_CAPSULE | Freq: Every day | ORAL | 2 refills | Status: DC

## 2017-12-26 NOTE — Telephone Encounter (Signed)
Budesonide refilled to [facility] as pharmacy requested.

## 2018-01-03 ENCOUNTER — Other Ambulatory Visit: Payer: Self-pay | Admitting: Endocrinology

## 2018-01-09 ENCOUNTER — Other Ambulatory Visit: Payer: 59

## 2018-01-09 ENCOUNTER — Other Ambulatory Visit: Payer: Self-pay | Admitting: Family Medicine

## 2018-01-09 DIAGNOSIS — J40 Bronchitis, not specified as acute or chronic: Secondary | ICD-10-CM

## 2018-01-12 ENCOUNTER — Ambulatory Visit: Payer: 59 | Admitting: Endocrinology

## 2018-01-21 ENCOUNTER — Other Ambulatory Visit: Payer: Self-pay | Admitting: Endocrinology

## 2018-02-01 ENCOUNTER — Other Ambulatory Visit: Payer: Self-pay | Admitting: Endocrinology

## 2018-02-05 ENCOUNTER — Other Ambulatory Visit: Payer: Self-pay | Admitting: Endocrinology

## 2018-02-05 DIAGNOSIS — E1165 Type 2 diabetes mellitus with hyperglycemia: Principal | ICD-10-CM

## 2018-02-05 DIAGNOSIS — IMO0001 Reserved for inherently not codable concepts without codable children: Secondary | ICD-10-CM

## 2018-02-16 LAB — HM DIABETES EYE EXAM

## 2018-02-17 ENCOUNTER — Other Ambulatory Visit (INDEPENDENT_AMBULATORY_CARE_PROVIDER_SITE_OTHER): Payer: 59

## 2018-02-17 DIAGNOSIS — E1165 Type 2 diabetes mellitus with hyperglycemia: Secondary | ICD-10-CM | POA: Diagnosis not present

## 2018-02-17 LAB — COMPREHENSIVE METABOLIC PANEL
ALBUMIN: 3.9 g/dL (ref 3.5–5.2)
ALK PHOS: 100 U/L (ref 39–117)
ALT: 52 U/L — ABNORMAL HIGH (ref 0–35)
AST: 47 U/L — AB (ref 0–37)
BUN: 10 mg/dL (ref 6–23)
CHLORIDE: 101 meq/L (ref 96–112)
CO2: 32 mEq/L (ref 19–32)
Calcium: 9.3 mg/dL (ref 8.4–10.5)
Creatinine, Ser: 0.59 mg/dL (ref 0.40–1.20)
GFR: 108.45 mL/min (ref >60.00)
GLUCOSE: 145 mg/dL — AB (ref 70–99)
POTASSIUM: 3.9 meq/L (ref 3.5–5.1)
SODIUM: 139 meq/L (ref 135–145)
TOTAL PROTEIN: 7.5 g/dL (ref 6.0–8.3)
Total Bilirubin: 1.3 mg/dL — ABNORMAL HIGH (ref 0.2–1.2)

## 2018-02-17 LAB — HEMOGLOBIN A1C: HEMOGLOBIN A1C: 6.2 % (ref 4.6–6.5)

## 2018-02-20 ENCOUNTER — Encounter: Payer: Self-pay | Admitting: *Deleted

## 2018-02-21 ENCOUNTER — Encounter: Payer: Self-pay | Admitting: Endocrinology

## 2018-02-21 ENCOUNTER — Ambulatory Visit: Payer: 59 | Admitting: Endocrinology

## 2018-02-21 VITALS — BP 132/86 | HR 97 | Ht 65.0 in | Wt 130.0 lb

## 2018-02-21 DIAGNOSIS — E782 Mixed hyperlipidemia: Secondary | ICD-10-CM | POA: Diagnosis not present

## 2018-02-21 DIAGNOSIS — E1165 Type 2 diabetes mellitus with hyperglycemia: Secondary | ICD-10-CM | POA: Diagnosis not present

## 2018-02-21 MED ORDER — GLIPIZIDE ER 2.5 MG PO TB24: 2.5000 mg | ORAL_TABLET | Freq: Every day | ORAL | 2 refills | Status: DC

## 2018-02-21 NOTE — Progress Notes (Signed)
[name], female   DOB: 1950-11-27, 67 y.o.   MRN: [id number]          Reason for Appointment: Follow-up for Type 2 Diabetes    History of Present Illness:          Date of diagnosis of type 2 diabetes mellitus: ?  2013        Background history:   She had a blood sugar over 200 in 2013 but was not told to have diabetes until 04/2015 A1c was 7.3 in 3/16 In 04/2015 blood sugar was 478 says her blood sugar was 600 when she was diagnosed and was not having any significant symptoms Initially treated with Metaglip but this apparently caused abdominal discomfort and she was switched to metformin In 2017 she had been tried on Bydureon for better control but she stopped this after a few weeks because of large persisted nodules in her skin She thinks her blood sugars were better with this and A1c was down to 6.8  Recent history:   Non-insulin hypoglycemic drugs the patient is taking are: Trulicity 1.5 mg weekly and metformin xr 1500 mg   Her A1c is again only  6.2, has been as high as 8.3 previously   Current management, blood sugar patterns and problems identified:  Her blood sugars are much higher than expected for her A1c  However on the day she had her labs drawn her blood sugar was 145 and about 45 minutes later her blood sugar at home was 239 without any food  She previously had thought that Amaryl was causing nausea  She does not like to check her sugars and mostly trying to do them before eating during the morning or midday  Now she is checking his blood sugars mostly late morning or midday and they are fairly consistently high except once  She thinks blood sugars are higher because of stress and on the day she ran over a cat her blood sugars were over 400 both morning and evening  She is not eating much because of stress and busy schedule and appears to be losing weight  As before she has difficulty tolerating metformin consistently and recently trying to  go up to 3 pills a day because of high sugars, this does cause some abdominal pain at times also  She has been offered consultation with a dietitian especially with her variable compliance with carbohydrate intake which she is reluctant to do this        Side effects from medications have been: Metaglip caused abdominal pain, Tradjenta caused swelling of the legs, Bydureon caused skin nodules, Farxiga caused diarrhea  Compliance with the medical regimen: Inconsistent  Glucose monitoring:  done less than 1 times a day         Glucometer: One Touch ultra mini.      Blood Glucose readings by time of day    PRE-MEAL Fasting Lunch Dinner Bedtime Overall  Glucose range:  188-346  126-402   490   Mean/median: 223    250   POST-MEAL PC Breakfast PC Lunch PC Dinner  Glucose range:   ?  Mean/median:      Previous readings:  PRE-MEAL Fasting Lunch Dinner Bedtime Overall  Glucose range:  131-188      Mean/median:     161   POST-MEAL PC Breakfast PC Lunch PC Dinner  Glucose range:  140-297   137-252  Mean/median:        Self-care:  Typical meal  intake: Breakfast is Oatmeal, usually eating low-fat dinner and her snacks will be fruit, peanut butter crackers                Dietician visit, most recent: None               Exercise: walking some  Weight history:  Wt Readings from Last 3 Encounters:  02/21/18 130 lb (59 kg)  11/02/17 133 lb (60.3 kg)  10/12/17 136 lb (61.7 kg)    Glycemic control:   Lab Results  Component Value Date   HGBA1C 6.2 02/17/2018   HGBA1C 5.9 07/08/2017   HGBA1C 5.9 02/22/2017   Lab Results  Component Value Date   MICROALBUR 8.2 (H) 07/12/2017   LDLCALC 174 (H) 02/22/2017   CREATININE 0.59 02/17/2018   Lab Results  Component Value Date   MICRALBCREAT 14.7 07/12/2017    Lab Results  Component Value Date   FRUCTOSAMINE 269 10/07/2017   FRUCTOSAMINE 266 07/08/2017   FRUCTOSAMINE 352 (H) 10/08/2016    Documentation on 02/20/2018  Component  Date Value Ref Range Status  . HM Diabetic Eye Exam 02/16/2018 No Retinopathy  No Retinopathy Final  Lab on 02/17/2018  Component Date Value Ref Range Status  . Sodium 02/17/2018 139  135 - 145 mEq/L Final  . Potassium 02/17/2018 3.9  3.5 - 5.1 mEq/L Final  . Chloride 02/17/2018 101  96 - 112 mEq/L Final  . CO2 02/17/2018 32  19 - 32 mEq/L Final  . Glucose, Bld 02/17/2018 145* 70 - 99 mg/dL Final  . BUN 02/17/2018 10  6 - 23 mg/dL Final  . Creatinine, Ser 02/17/2018 0.59  0.40 - 1.20 mg/dL Final  . Total Bilirubin 02/17/2018 1.3* 0.2 - 1.2 mg/dL Final  . Alkaline Phosphatase 02/17/2018 100  39 - 117 U/L Final  . AST 02/17/2018 47* 0 - 37 U/L Final  . ALT 02/17/2018 52* 0 - 35 U/L Final  . Total Protein 02/17/2018 7.5  6.0 - 8.3 g/dL Final  . Albumin 02/17/2018 3.9  3.5 - 5.2 g/dL Final  . Calcium 02/17/2018 9.3  8.4 - 10.5 mg/dL Final  . GFR 02/17/2018 108.45  >60.00 mL/min Final  . Hgb A1c MFr Bld 02/17/2018 6.2  4.6 - 6.5 % Final   Glycemic Control Guidelines for People with Diabetes:Non Diabetic:  <6%Goal of Therapy: <7%Additional Action Suggested:  >8%      Allergies as of 02/21/2018      Reactions   Fentanyl Anxiety   Made her feel very anxious, requests not to get it.   Levofloxacin Anxiety   Weird dreams    Prednisone    Anxiety and paranoia   Amoxicillin Diarrhea   Has patient had a PCN reaction causing immediate rash, facial/tongue/throat swelling, SOB or lightheadedness with hypotension: No Has patient had a PCN reaction causing severe rash involving mucus membranes or skin necrosis: No Has patient had a PCN reaction that required hospitalization: No Has patient had a PCN reaction occurring within the last 10 years: Yes If all of the above answers are "NO", then may proceed with Cephalosporin use.   Augmentin [amoxicillin-pot Clavulanate] Other (See Comments)   diarrhea   Farxiga [dapagliflozin] Diarrhea   Gi pain   Statins    Myalgia       Medication List         Accurate as of 02/21/18  8:53 PM. Always use your most recent med list.          ALPRAZolam  0.5 MG tablet Commonly known as:  XANAX Take 1 tablet (0.5 mg total) by mouth at bedtime as needed for anxiety.   aspirin 325 MG tablet Take 650-975 mg by mouth daily as needed for moderate pain or headache.   azelastine 0.05 % ophthalmic solution Commonly known as:  OPTIVAR INSTILL 1 DROP IN AFFECTED EYE(S) DAILY   blood glucose meter kit and supplies Kit Dispense based on patient and insurance preference. Use up to four times daily as directed. (FOR ICD-9 250.00, 250.01).   budesonide 3 MG 24 hr capsule Commonly known as:  ENTOCORT EC Take 3 capsules (9 mg total) by mouth daily.   cyclobenzaprine 10 MG tablet Commonly known as:  FLEXERIL take 1 tablet by mouth at bedtime if needed   diphenhydrAMINE 25 mg capsule Commonly known as:  BENADRYL Take 25-50 mg by mouth daily as needed for allergies.   diphenhydrAMINE-zinc acetate cream Commonly known as:  BENADRYL Apply 1 application topically daily as needed for itching.   EFFEXOR XR 75 MG 24 hr capsule Generic drug:  venlafaxine XR TAKE ONE CAPSULE BY MOUTH TWICE DAILY WITH A MEAL   ferrous sulfate 325 (65 FE) MG tablet Take 325 mg by mouth 2 (two) times daily with a meal.   Fluticasone-Salmeterol 100-50 MCG/DOSE Aepb Commonly known as:  ADVAIR Inhale 1 puff into the lungs 2 (two) times daily.   furosemide 20 MG tablet Commonly known as:  LASIX Take 1 tablet (20 mg total) by mouth daily as needed.   glipiZIDE 2.5 MG 24 hr tablet Commonly known as:  GLUCOTROL XL Take 1 tablet (2.5 mg total) by mouth daily with breakfast.   glucose blood test strip Use as instructed to test once daily.   HAIR SKIN AND NAILS FORMULA Tabs Take 1 tablet by mouth daily.   lansoprazole 30 MG capsule Commonly known as:  PREVACID Take 1 capsule (30 mg total) by mouth daily.   metFORMIN 500 MG 24 hr tablet Commonly known as:   GLUCOPHAGE-XR TAKE 3 TABLETS(1500 MG) BY MOUTH DAILY WITH SUPPER   montelukast 10 MG tablet Commonly known as:  SINGULAIR TAKE 1 TABLET BY MOUTH AT BEDTIME.   ONETOUCH DELICA LANCETS 11X Misc USE UP TO FOUR TIMES DAILY AS DIRECTED   oxyCODONE-acetaminophen 5-325 MG tablet Commonly known as:  PERCOCET/ROXICET Take 0.5 tablets by mouth every 8 (eight) hours as needed for severe pain.   phenylephrine 10 MG Tabs tablet Commonly known as:  SUDAFED PE Take 10 mg by mouth every 4 (four) hours as needed.   promethazine 12.5 MG tablet Commonly known as:  PHENERGAN Take 1 tablet (12.5 mg total) by mouth every 8 (eight) hours as needed for nausea or vomiting.   pseudoephedrine 30 MG tablet Commonly known as:  SUDAFED Take 30 mg by mouth daily as needed for congestion.   sodium chloride inhaler solution Commonly known as:  BRONCHO SALINE Take 1 spray by nebulization as needed.   TRULICITY 1.5 BW/6.2MB Sopn Generic drug:  Dulaglutide INJECT ONCE WEEKLY AS DIRECTED       Allergies:  Allergies  Allergen Reactions  . Fentanyl Anxiety    Made her feel very anxious, requests not to get it.  . Levofloxacin Anxiety    Weird dreams   . Prednisone     Anxiety and paranoia  . Amoxicillin Diarrhea    Has patient had a PCN reaction causing immediate rash, facial/tongue/throat swelling, SOB or lightheadedness with hypotension: No Has patient had a PCN reaction causing  severe rash involving mucus membranes or skin necrosis: No Has patient had a PCN reaction that required hospitalization: No Has patient had a PCN reaction occurring within the last 10 years: Yes If all of the above answers are "NO", then may proceed with Cephalosporin use.   . Augmentin [Amoxicillin-Pot Clavulanate] Other (See Comments)    diarrhea  . Wilder Glade [Dapagliflozin] Diarrhea    Gi pain  . Statins     Myalgia     Past Medical History:  Diagnosis Date  . Allergy   . Anxiety   . Asthma   . 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 Depression   . DM (diabetes mellitus) ([place])   . 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 GERD (gastroesophageal reflux disease)   . Hiatal hernia   . Hx of adenomatous polyp of colon 07/07/2009  . Hyperlipidemia   . Hypertension   . IBS (irritable bowel syndrome)   . Iron deficiency anemia   . 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 Sinusitis, chronic   . Thyroid nodule   . Umbilical hernia   . Uterine fibroid   . Vascular disease   . Vitamin D deficiency     Past Surgical History:  Procedure Laterality Date  . CATARACT EXTRACTION Bilateral   . COLONOSCOPY  11/1991, 06/2009  . ESOPHAGOGASTRODUODENOSCOPY  02/2002  . ESOPHAGOGASTRODUODENOSCOPY (EGD) WITH PROPOFOL N/A 06/20/2017   Procedure: ESOPHAGOGASTRODUODENOSCOPY (EGD) WITH PROPOFOL;  Surgeon: Gatha Mayer, MD;  Location: [facility];  Service: Endoscopy;  Laterality: N/A;  with Barrx  . EYE SURGERY    . NASAL SEPTOPLASTY W/ TURBINOPLASTY    . TONSILLECTOMY      Family History  Problem Relation Age of Onset  . Stroke Mother   . Hypertension Mother   . Colon cancer Father   . Liver cancer Father   . Heart disease Sister        hole in heart  . Stroke Maternal Grandmother   . Hypertension Maternal Grandmother   . Esophageal cancer Maternal Grandfather   . Breast cancer Paternal Grandmother   . Brain cancer Paternal Grandmother   . Lung cancer Paternal Grandfather   . Diabetes Neg Hx     Social History:  reports that she has never smoked. She has never used smokeless tobacco. She reports that she drinks alcohol. She reports that she does not use drugs.   Review of Systems    Lipid history: Previously had tried Zocor which apparently caused muscle aches, she took Crestor and she thinks generic causes muscle cramps  She took Livalo for a couple of days but she thinks she didn't feel well with  this and had some palpitations and does not want to take it again Currently on no treatment     Lab Results  Component Value Date   CHOL 246 (H) 02/22/2017   HDL 50.50 02/22/2017   LDLCALC 174 (H) 02/22/2017   TRIG 108.0 02/22/2017   CHOLHDL 5 02/22/2017           Lab Results  Component Value Date   ALT 52 (H) 02/17/2018    Hypertension: None, she is taking Maxzide because of tendency to swelling in her abdomen    BP Readings from Last 3 Encounters:  02/21/18 132/86  11/02/17 120/82  10/12/17 140/90     Most recent eye exam was 7/17  She has evidence of neuropathy  on exam  Most recent foot exam:09/2016    LABS:  Documentation on 02/20/2018  Component Date Value Ref Range Status  . HM Diabetic Eye Exam 02/16/2018 No Retinopathy  No Retinopathy Final  Lab on 02/17/2018  Component Date Value Ref Range Status  . Sodium 02/17/2018 139  135 - 145 mEq/L Final  . Potassium 02/17/2018 3.9  3.5 - 5.1 mEq/L Final  . Chloride 02/17/2018 101  96 - 112 mEq/L Final  . CO2 02/17/2018 32  19 - 32 mEq/L Final  . Glucose, Bld 02/17/2018 145* 70 - 99 mg/dL Final  . BUN 02/17/2018 10  6 - 23 mg/dL Final  . Creatinine, Ser 02/17/2018 0.59  0.40 - 1.20 mg/dL Final  . Total Bilirubin 02/17/2018 1.3* 0.2 - 1.2 mg/dL Final  . Alkaline Phosphatase 02/17/2018 100  39 - 117 U/L Final  . AST 02/17/2018 47* 0 - 37 U/L Final  . ALT 02/17/2018 52* 0 - 35 U/L Final  . Total Protein 02/17/2018 7.5  6.0 - 8.3 g/dL Final  . Albumin 02/17/2018 3.9  3.5 - 5.2 g/dL Final  . Calcium 02/17/2018 9.3  8.4 - 10.5 mg/dL Final  . GFR 02/17/2018 108.45  >60.00 mL/min Final  . Hgb A1c MFr Bld 02/17/2018 6.2  4.6 - 6.5 % Final   Glycemic Control Guidelines for People with Diabetes:Non Diabetic:  <6%Goal of Therapy: <7%Additional Action Suggested:  >8%     Physical Examination:  BP 132/86   Pulse 97   Ht '5\' 5"'$  (1.651 m)   Wt 130 lb (59 kg)   SpO2 98%   BMI 21.63 kg/m           ASSESSMENT:  Diabetes type 2, normal BMI of 23     See history of present illness for detailed discussion of current diabetes management, blood sugar patterns and problems identified  A1c has been usually lower than expected, now 6.2  Her blood sugars are averaging over 200 at home but checking sporadically Also not clear if her home monitor is accurate since blood sugar was much higher than in the lab the same morning She is again having variable compliance with diet She thinks stress makes her blood sugars go up including on the day when it was over 400 Also because of her schedule not finding time to exercise   PLAN:   She will be given a new One Touch ultra 2 meter To start checking blood sugars much more regularly including after meals as discussed today Discussed blood sugar targets She will be given a trial of glipizide ER since she does have fairly consistent hyperglycemia and probably higher after meals We will review her blood sugars and progress in a month and consider basal insulin if fasting readings are consistently high She will need to discuss her continued weight loss with PCP or gastroenterologist She can limit her metformin to 2 tablets daily but take them in split doses for better tolerability Continue Trulicity  Review in 1 month  Patient Instructions  Check blood sugars on waking up  2/7  Also check blood sugars about 2 hours after a meal and do this after different meals by rotation  Recommended blood sugar levels on waking up is 90-130 and about 2 hours after meal is 130-160  Please bring your blood sugar monitor to each visit, thank you       Elayne Snare 02/21/2018, 8:53 PM   Note: This office note was prepared with Dragon voice recognition system  technology. Any transcriptional errors that result from this process are unintentional.

## 2018-02-21 NOTE — Patient Instructions (Signed)
Check blood sugars on waking up  2/7  Also check blood sugars about 2 hours after a meal and do this after different meals by rotation  Recommended blood sugar levels on waking up is 90-130 and about 2 hours after meal is 130-160  Please bring your blood sugar monitor to each visit, thank you  

## 2018-02-23 ENCOUNTER — Telehealth: Payer: Self-pay | Admitting: Emergency Medicine

## 2018-02-23 ENCOUNTER — Other Ambulatory Visit: Payer: Self-pay | Admitting: Endocrinology

## 2018-02-23 DIAGNOSIS — IMO0001 Reserved for inherently not codable concepts without codable children: Secondary | ICD-10-CM

## 2018-02-23 DIAGNOSIS — E1165 Type 2 diabetes mellitus with hyperglycemia: Principal | ICD-10-CM

## 2018-02-23 MED ORDER — ONETOUCH ULTRA 2 W/DEVICE KIT: PACK | 0 refills | Status: DC

## 2018-02-23 NOTE — Telephone Encounter (Signed)
Please advise 

## 2018-02-23 NOTE — Telephone Encounter (Signed)
Pt called and stated she started taking the new medication glipiZIDE (GLIPIZIDE XL) 2.5 MG 24 hr tablet today. She stated she she took the medication she has been dizzy, Stomach pain, her heart is racing, lethargic and she's been sweating. Can you please give her a call back thanks.

## 2018-02-23 NOTE — Telephone Encounter (Signed)
Since her blood sugar was over 200 today unlikely the medication caused low sugar.  She will talk to her PCP tomorrow to see there is another reason why she is having all the symptoms.  Unlikely to be from glipizide but discussed that if blood sugars continue to be high will need to start insulin One Touch monitor prescription sent

## 2018-02-24 ENCOUNTER — Other Ambulatory Visit: Payer: Self-pay

## 2018-02-24 ENCOUNTER — Telehealth: Payer: Self-pay | Admitting: Endocrinology

## 2018-02-24 MED ORDER — ONETOUCH ULTRA 2 W/DEVICE KIT: PACK | 0 refills | Status: DC

## 2018-02-24 NOTE — Telephone Encounter (Signed)
Gave patient MD advice and she stated an understanding-patient was told by MD to DC glipizide

## 2018-02-24 NOTE — Telephone Encounter (Signed)
Done

## 2018-02-24 NOTE — Telephone Encounter (Signed)
Per [facility]-Caller needs a new one touch meter called in

## 2018-02-28 ENCOUNTER — Other Ambulatory Visit: Payer: Self-pay

## 2018-02-28 DIAGNOSIS — K754 Autoimmune hepatitis: Secondary | ICD-10-CM

## 2018-02-28 NOTE — Progress Notes (Signed)
Miranda Cochran,  Let Jyssica know I am aware of the increased LFT's  Stay on current meds and set up a next available visit w/ me w/ LFT's again in 1 month from now

## 2018-03-05 ENCOUNTER — Other Ambulatory Visit: Payer: Self-pay | Admitting: Endocrinology

## 2018-03-13 ENCOUNTER — Telehealth: Payer: Self-pay | Admitting: Endocrinology

## 2018-03-13 NOTE — Telephone Encounter (Signed)
Pt has an appt coming up and is asking if she really needs it because she was put on a new Blood sugar medication, glimiperide, but was taken off of it because of side effects, she thought this appt was to see how that med worked if she had stayed on it so since she was taken off does she still need the appt?

## 2018-03-13 NOTE — Telephone Encounter (Signed)
She was prescribed glipizide not glimepiride.  If she is not taking this then she may be needing insulin unless blood sugars in the mornings are below 140

## 2018-03-14 NOTE — Telephone Encounter (Signed)
Pt is aware and stated that she will try Glipizide again this weekend, incase she has an reaction. Changed her appt to the first opening found which was 11/25. Pt verbalized understanding of checking her sugars daily.

## 2018-03-14 NOTE — Telephone Encounter (Signed)
She will need to start taking her morning sugars daily.  I have discussed the glipizide on the phone with her and did not think she had a side effect, I would like her to try this again with her main meal.  Needs to reschedule appointment for about 1 month

## 2018-03-14 NOTE — Telephone Encounter (Signed)
Pt states that she is not checking her sugars, and she said that she needs to cancel her appointment for 03/15/18. Refuses insulin. Pt stated she only took Glipizide one day then stopped.

## 2018-03-15 ENCOUNTER — Ambulatory Visit: Payer: 59 | Admitting: Endocrinology

## 2018-03-22 ENCOUNTER — Other Ambulatory Visit: Payer: Self-pay | Admitting: Adult Health

## 2018-03-22 DIAGNOSIS — Z1231 Encounter for screening mammogram for malignant neoplasm of breast: Secondary | ICD-10-CM

## 2018-03-29 ENCOUNTER — Other Ambulatory Visit: Payer: Self-pay | Admitting: Endocrinology

## 2018-04-07 ENCOUNTER — Telehealth: Payer: Self-pay | Admitting: Internal Medicine

## 2018-04-07 NOTE — Telephone Encounter (Signed)
Pt is going for labs on Monday, she wants to know if Dr. Carlean Purl could add blood test to check her auto immune disease. Pls call her.

## 2018-04-07 NOTE — Telephone Encounter (Signed)
Left message to call back  

## 2018-04-10 ENCOUNTER — Other Ambulatory Visit (INDEPENDENT_AMBULATORY_CARE_PROVIDER_SITE_OTHER): Payer: 59

## 2018-04-10 DIAGNOSIS — K754 Autoimmune hepatitis: Secondary | ICD-10-CM | POA: Diagnosis not present

## 2018-04-10 LAB — HEPATIC FUNCTION PANEL
ALBUMIN: 3.8 g/dL (ref 3.5–5.2)
ALT: 34 U/L (ref 0–35)
AST: 36 U/L (ref 0–37)
Alkaline Phosphatase: 91 U/L (ref 39–117)
BILIRUBIN DIRECT: 0.1 mg/dL (ref 0.0–0.3)
TOTAL PROTEIN: 7.2 g/dL (ref 6.0–8.3)
Total Bilirubin: 0.9 mg/dL (ref 0.2–1.2)

## 2018-04-11 NOTE — Telephone Encounter (Signed)
Patient has already been to the lab.  She has OV tomorrow and will discuss further with Dr. Carlean Purl

## 2018-04-12 ENCOUNTER — Encounter: Payer: Self-pay | Admitting: Internal Medicine

## 2018-04-12 ENCOUNTER — Ambulatory Visit: Payer: 59 | Admitting: Internal Medicine

## 2018-04-12 ENCOUNTER — Other Ambulatory Visit (INDEPENDENT_AMBULATORY_CARE_PROVIDER_SITE_OTHER): Payer: 59

## 2018-04-12 VITALS — BP 128/80 | HR 72 | Ht 66.0 in | Wt 134.0 lb

## 2018-04-12 DIAGNOSIS — I872 Venous insufficiency (chronic) (peripheral): Secondary | ICD-10-CM | POA: Diagnosis not present

## 2018-04-12 DIAGNOSIS — K754 Autoimmune hepatitis: Secondary | ICD-10-CM | POA: Diagnosis not present

## 2018-04-12 DIAGNOSIS — D500 Iron deficiency anemia secondary to blood loss (chronic): Secondary | ICD-10-CM | POA: Diagnosis not present

## 2018-04-12 DIAGNOSIS — Z7952 Long term (current) use of systemic steroids: Secondary | ICD-10-CM | POA: Diagnosis not present

## 2018-04-12 LAB — CBC WITH DIFFERENTIAL/PLATELET
BASOS PCT: 1.1 % (ref 0.0–3.0)
Basophils Absolute: 0.1 10*3/uL (ref 0.0–0.1)
EOS ABS: 0.1 10*3/uL (ref 0.0–0.7)
EOS PCT: 1.3 % (ref 0.0–5.0)
HCT: 40.8 % (ref 36.0–46.0)
HEMOGLOBIN: 14.5 g/dL (ref 12.0–15.0)
Lymphocytes Relative: 20.7 % (ref 12.0–46.0)
Lymphs Abs: 2 10*3/uL (ref 0.7–4.0)
MCHC: 35.4 g/dL (ref 30.0–36.0)
MCV: 97.3 fl (ref 78.0–100.0)
MONO ABS: 0.8 10*3/uL (ref 0.1–1.0)
Monocytes Relative: 8 % (ref 3.0–12.0)
NEUTROS ABS: 6.6 10*3/uL (ref 1.4–7.7)
Neutrophils Relative %: 68.9 % (ref 43.0–77.0)
PLATELETS: 166 10*3/uL (ref 150.0–400.0)
RBC: 4.2 Mil/uL (ref 3.87–5.11)
RDW: 13.7 % (ref 11.5–15.5)
WBC: 9.6 10*3/uL (ref 4.0–10.5)

## 2018-04-12 LAB — FERRITIN: Ferritin: 12.9 ng/mL (ref 10.0–291.0)

## 2018-04-12 NOTE — Assessment & Plan Note (Signed)
CBC ferritin 

## 2018-04-12 NOTE — Patient Instructions (Signed)
  Your provider has requested that you go to the basement level for lab work before leaving today. Press "B" on the elevator. The lab is located at the first door on the left as you exit the elevator. We will contact you with results and plans.   We are putting you in for a referral to [facility] . They will contact you to set this up. They are located off [address] in [place]. Phone # [phone].   I appreciate the opportunity to care for you. Silvano Rusk, MD, [facility]

## 2018-04-12 NOTE — Assessment & Plan Note (Addendum)
She is aware of side effects including bone loss cataracts hypertension thinning of the skin etc.

## 2018-04-12 NOTE — Progress Notes (Signed)
Miranda Cochran 67 y.o. 1950/07/30 [id number]  Assessment & Plan:   Autoimmune hepatitis treated with steroids ([place]) LFTs are normal on 6 mg daily of budesonide.  I once again reviewed risks of long-term steroids, she does not want to take azathioprine due to the rare potential risk of lymphoma.  We will continue the budesonide.  Reviewed alcohol use with her and she says it is rare on some weekends, abstinence is best.  I made her aware of this. I anticipate seeing her again in 6 months sooner as needed. Probably repeat labs in 3 months waiting for CBC and ferritin to determine this.  We will coordinate after that. 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   Venous (peripheral) insufficiency Physical findings seem to be consistent with this.  She is interested in understanding what might be done so I will refer her to [place] flu shot  Subjective:   Chief Complaint: Follow-up of autoimmune hepatitis  HPI Miranda Cochran reports she is feeling well.  She had a stressful situation with her job where she was doing 2 jobs and she is back to her original position and feels okay.  Her transaminases were slightly elevated in early September when she saw her diabetologist.  Blood sugars were up and down as well.  Hemoglobin A1c is 6.2% not as good as it has been it has been lower but better than in the past.  She asks me what type of alcohol is best her safest for her liver and I explained that abstinence is really the most appropriate approach.  She has purchased a supplement called liver MD and says she feels much better on that taking it twice a day.  She could not tolerate oral hypoglycemics and is off those  but remains on her metformin and Trulicity.  He has noticed some easy bruising and says that her feet have pooling of blood.  He never takes the flu shot she says only twice in her life and she is never gotten the flu and does not want to take it.  Lab Results  Component Value Date   ALT 34 04/10/2018   AST 36 04/10/2018   ALKPHOS 91 04/10/2018   BILITOT 0.9 04/10/2018    02/17/2018 Total Bilirubin 0.2 - 1.2 mg/dL 1.3High    Alkaline Phosphatase 39 - 117 U/L 100   AST 0 - 37 U/L 47High    ALT 0 - 35 U/L 52High    Total Protein 6.0 - 8.3 g/dL 7.5          Allergies  Allergen Reactions  . Fentanyl Anxiety    Made her feel very anxious, requests not to get it.  . Levofloxacin Anxiety    Weird dreams   . Prednisone     Anxiety and paranoia  . Amoxicillin Diarrhea    Has patient had a PCN reaction causing immediate rash, facial/tongue/throat swelling, SOB or lightheadedness with hypotension: No Has patient had a PCN reaction causing severe rash involving mucus membranes or skin necrosis: No Has patient had a PCN reaction that required hospitalization: No Has patient had a PCN reaction occurring within the last 10 years: Yes If all of the above answers are "  NO", then may proceed with Cephalosporin use.   . Augmentin [Amoxicillin-Pot Clavulanate] Other (See Comments)    diarrhea  . Wilder Glade [Dapagliflozin] Diarrhea    Gi pain  . Statins     Myalgia    Current Meds  Medication Sig  . ALPRAZolam (XANAX) 0.5 MG tablet Take 1 tablet (0.5 mg total) by mouth at bedtime as needed for anxiety.  Marland Kitchen aspirin 325 MG tablet Take 650-975 mg by mouth daily as needed for moderate pain or headache.   Marland Kitchen azelastine (OPTIVAR) 0.05 % ophthalmic solution INSTILL 1 DROP IN AFFECTED EYE(S) DAILY  . blood glucose meter kit and supplies KIT Dispense based on patient and insurance preference. Use up to four times daily as directed. (FOR ICD-9 250.00, 250.01).  . Blood Glucose Monitoring Suppl (ONE TOUCH  ULTRA 2) w/Device KIT Use to check blood sugar  . budesonide (ENTOCORT EC) 3 MG 24 hr capsule Take 3 capsules (9 mg total) by mouth daily.  . cyclobenzaprine (FLEXERIL) 10 MG tablet take 1 tablet by mouth at bedtime if needed (Patient taking differently: Take 5-10 mg by mouth daily as needed for muscle spasms. )  . diphenhydrAMINE (BENADRYL) 25 mg capsule Take 25-50 mg by mouth daily as needed for allergies.   . diphenhydrAMINE-zinc acetate (BENADRYL) cream Apply 1 application topically daily as needed for itching.  . EFFEXOR XR 75 MG 24 hr capsule TAKE ONE CAPSULE BY MOUTH TWICE DAILY WITH A MEAL  . ferrous sulfate 325 (65 FE) MG tablet Take 325 mg by mouth 2 (two) times daily with a meal.  . Fluticasone-Salmeterol (ADVAIR DISKUS) 100-50 MCG/DOSE AEPB Inhale 1 puff into the lungs 2 (two) times daily. (Patient taking differently: Inhale 1 puff into the lungs daily as needed (shortness of breath). )  . furosemide (LASIX) 20 MG tablet Take 1 tablet (20 mg total) by mouth daily as needed.  Marland Kitchen glucose blood test strip Use as instructed to test once daily.  . lansoprazole (PREVACID) 30 MG capsule Take 1 capsule (30 mg total) by mouth daily.  . Liver Extract (LIVER PO) Take 1 capsule by mouth 2 (two) times daily.  . metFORMIN (GLUCOPHAGE-XR) 500 MG 24 hr tablet TAKE 3 TABLETS(1500 MG) BY MOUTH DAILY WITH SUPPER (Patient taking differently: Take 1,000 mg by mouth 2 (two) times daily. )  . montelukast (SINGULAIR) 10 MG tablet TAKE 1 TABLET BY MOUTH AT BEDTIME.  . Multiple Vitamins-Minerals (HAIR SKIN AND NAILS FORMULA) TABS Take 1 tablet by mouth daily.  Glory Rosebush DELICA LANCETS 41R MISC USE UP TO FOUR TIMES DAILY AS DIRECTED  . oxyCODONE-acetaminophen (PERCOCET/ROXICET) 5-325 MG tablet Take 0.5 tablets by mouth every 8 (eight) hours as needed for severe pain.  . phenylephrine (SUDAFED PE) 10 MG TABS tablet Take 10 mg by mouth every 4 (four) hours as needed.  . promethazine (PHENERGAN) 12.5 MG tablet Take  1 tablet (12.5 mg total) by mouth every 8 (eight) hours as needed for nausea or vomiting.  . pseudoephedrine (SUDAFED) 30 MG tablet Take 30 mg by mouth daily as needed for congestion.   . sodium chloride (BRONCHO SALINE) inhaler solution Take 1 spray by nebulization as needed.  . TRULICITY 1.5 AX/0.9MM SOPN INJECT ONCE WEEKLY AS DIRECTED   Past Medical History:  Diagnosis Date  . Allergy   . Anxiety   . Asthma   . Autoimmune hepatitis ([place]) 08/18/2016   08/2016 liver bx confirms, fibrosis not cirrhosis  . Depression   . DM (diabetes mellitus) ([place])   .  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 GERD (gastroesophageal reflux disease)   . Hiatal hernia   . Hx of adenomatous polyp of colon 07/07/2009  . Hyperlipidemia   . Hypertension   . IBS (irritable bowel syndrome)   . Iron deficiency anemia   . 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 Sinusitis, chronic   . Thyroid nodule   . Umbilical hernia   . Uterine fibroid   . Vascular disease   . Vitamin D deficiency    Past Surgical History:  Procedure Laterality Date  . CATARACT EXTRACTION Bilateral   . COLONOSCOPY  11/1991, 06/2009  . ESOPHAGOGASTRODUODENOSCOPY  02/2002  . ESOPHAGOGASTRODUODENOSCOPY (EGD) WITH PROPOFOL N/A 06/20/2017   Procedure: ESOPHAGOGASTRODUODENOSCOPY (EGD) WITH PROPOFOL;  Surgeon: Gatha Mayer, MD;  Location: [facility];  Service: Endoscopy;  Laterality: N/A;  with Barrx  . EYE SURGERY    . NASAL SEPTOPLASTY W/ TURBINOPLASTY    . 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   Review of Systems As above  Objective:   Physical Exam BP 128/80   Pulse 72   Ht 5' 6" (1.676 m)   Wt 134 lb (60.8 kg)   BMI 21.63 kg/m  Well-developed well-nourished no acute distress The eyes are anicteric The lungs are clear Heart sounds are normal The abdomen is slightly protuberant soft and nontender, question palpable liver edge in the mid right upper quadrant epigastric area no splenomegaly Lower extremities with plethora of rubor and what looks like pooling of blood in the soft tissues that changes with position and compression there are a few varicosities as well calves

## 2018-04-12 NOTE — Assessment & Plan Note (Addendum)
LFTs are normal on 6 mg daily of budesonide.  I once again reviewed risks of long-term steroids, she does not want to take azathioprine due to the rare potential risk of lymphoma.  We will continue the budesonide.  Reviewed alcohol use with her and she says it is rare on some weekends, abstinence is best.  I made her aware of this. I anticipate seeing her again in 6 months sooner as needed. Probably repeat labs in 3 months waiting for CBC and ferritin to determine this.  We will coordinate after that. Consider repeat biopsy next year at about the 2-year mark of diagnosis and treatment initiation (spring 2018)

## 2018-04-12 NOTE — Assessment & Plan Note (Signed)
Physical findings seem to be consistent with this.  She is interested in understanding what might be done so I will refer her to [place] [facility]

## 2018-04-14 ENCOUNTER — Other Ambulatory Visit: Payer: Self-pay | Admitting: Internal Medicine

## 2018-04-14 DIAGNOSIS — K754 Autoimmune hepatitis: Secondary | ICD-10-CM

## 2018-04-14 DIAGNOSIS — D500 Iron deficiency anemia secondary to blood loss (chronic): Secondary | ICD-10-CM

## 2018-04-14 NOTE — Progress Notes (Signed)
My chart message NL CBC Low NL ferritin Consider feraheme

## 2018-04-22 ENCOUNTER — Other Ambulatory Visit: Payer: Self-pay | Admitting: Endocrinology

## 2018-05-02 ENCOUNTER — Ambulatory Visit
Admission: RE | Admit: 2018-05-02 | Discharge: 2018-05-02 | Disposition: A | Discharge status: Home / Self Care | Payer: 59 | Source: Ambulatory Visit | Attending: Adult Health | Admitting: Adult Health

## 2018-05-02 DIAGNOSIS — Z1231 Encounter for screening mammogram for malignant neoplasm of breast: Secondary | ICD-10-CM

## 2018-05-07 ENCOUNTER — Other Ambulatory Visit: Payer: Self-pay | Admitting: Internal Medicine

## 2018-05-07 NOTE — Progress Notes (Signed)
[name], female   DOB: 03-Aug-1950, 67 y.o.   MRN: [id number]          Reason for Appointment: Follow-up for Type 2 Diabetes    History of Present Illness:          Date of diagnosis of type 2 diabetes mellitus: ?  2013        Background history:   She had a blood sugar over 200 in 2013 but was not told to have diabetes until 04/2015 A1c was 7.3 in 3/16 In 04/2015 blood sugar was 478 says her blood sugar was 600 when she was diagnosed and was not having any significant symptoms Initially treated with Metaglip but this apparently caused abdominal discomfort and she was switched to metformin In 2017 she had been tried on Bydureon for better control but she stopped this after a few weeks because of large persisted nodules in her skin She thinks her blood sugars were better with this and A1c was down to 6.8  Recent history:   Non-insulin hypoglycemic drugs the patient is taking are: Trulicity 1.5 mg weekly and metformin 1500 mg   Her A1c in September was 6.2, has been as high as 8.3 previously   Current management, blood sugar patterns and problems identified:  Her blood sugars are still consistently high  She was given a trial of glipizide ER 2.5 mg but she thinks it caused her to be dizzy and weak as well as sweating without low sugars  She is trying to regular metformin which she thinks causes less side effects than the extended release and she is taking 1 tablet with each meal without significant abdominal pain or nausea  However she thinks her appetite is decreased and she does not know why  She says she does like to eat sweets but does not do this every day  However blood sugars are fairly consistently high including fasting, highest blood sugar 353  Also blood sugars are averaging over 200 in the morning also  She is reluctant to go on insulin  Waiting for fructosamine from today        Side effects from medications have been: Metaglip caused  abdominal pain, Tradjenta caused swelling of the legs, Bydureon caused skin nodules, Farxiga caused diarrhea  Compliance with the medical regimen: Inconsistent  Glucose monitoring:  done less than 1 times a day         Glucometer: One Touch ultra mini.      Blood Glucose readings by time of day    PRE-MEAL Fasting Lunch  5-7 PM Bedtime Overall  Glucose range:  162-304  165-353  201-254    Mean/median:  199     205+/-63   POST-MEAL PC Breakfast PC Lunch PC Dinner  Glucose range:     Mean/median:   263    Previous readings:  PRE-MEAL Fasting Lunch Dinner Bedtime Overall  Glucose range:  188-346  126-402   490   Mean/median: 223    250   POST-MEAL PC Breakfast PC Lunch PC Dinner  Glucose range:   ?  Mean/median:          Self-care:  Typical meal intake: Breakfast is Oatmeal, usually eating low-fat dinner and her snacks will be fruit, peanut butter crackers                Dietician visit, most recent: None               Exercise: walking occasionally  Weight history:  Wt Readings from Last 3 Encounters:  05/08/18 132 lb 3.2 oz (60 kg)  04/12/18 134 lb (60.8 kg)  02/21/18 130 lb (59 kg)    Glycemic control:   Lab Results  Component Value Date   HGBA1C 6.2 02/17/2018   HGBA1C 5.9 07/08/2017   HGBA1C 5.9 02/22/2017   Lab Results  Component Value Date   MICROALBUR 8.2 (H) 07/12/2017   LDLCALC 174 (H) 02/22/2017   CREATININE 0.59 02/17/2018   Lab Results  Component Value Date   MICRALBCREAT 14.7 07/12/2017    Lab Results  Component Value Date   FRUCTOSAMINE 269 10/07/2017   FRUCTOSAMINE 266 07/08/2017   FRUCTOSAMINE 352 (H) 10/08/2016    No visits with results within 1 Week(s) from this visit.  Latest known visit with results is:  Lab on 04/12/2018  Component Date Value Ref Range Status  . Ferritin 04/12/2018 12.9  10.0 - 291.0 ng/mL Final  . WBC 04/12/2018 9.6  4.0 - 10.5 K/uL Final  . RBC 04/12/2018 4.20  3.87 - 5.11 Mil/uL Final  . Hemoglobin  04/12/2018 14.5  12.0 - 15.0 g/dL Final  . HCT 04/12/2018 40.8  36.0 - 46.0 % Final  . MCV 04/12/2018 97.3  78.0 - 100.0 fl Final  . MCHC 04/12/2018 35.4  30.0 - 36.0 g/dL Final  . RDW 04/12/2018 13.7  11.5 - 15.5 % Final  . Platelets 04/12/2018 166.0  150.0 - 400.0 K/uL Final  . Neutrophils Relative % 04/12/2018 68.9  43.0 - 77.0 % Final  . Lymphocytes Relative 04/12/2018 20.7  12.0 - 46.0 % Final  . Monocytes Relative 04/12/2018 8.0  3.0 - 12.0 % Final  . Eosinophils Relative 04/12/2018 1.3  0.0 - 5.0 % Final  . Basophils Relative 04/12/2018 1.1  0.0 - 3.0 % Final  . Neutro Abs 04/12/2018 6.6  1.4 - 7.7 K/uL Final  . Lymphs Abs 04/12/2018 2.0  0.7 - 4.0 K/uL Final  . Monocytes Absolute 04/12/2018 0.8  0.1 - 1.0 K/uL Final  . Eosinophils Absolute 04/12/2018 0.1  0.0 - 0.7 K/uL Final  . Basophils Absolute 04/12/2018 0.1  0.0 - 0.1 K/uL Final     Allergies as of 05/08/2018      Reactions   Fentanyl Anxiety   Made her feel very anxious, requests not to get it.   Levofloxacin Anxiety   Weird dreams    Prednisone    Anxiety and paranoia   Amoxicillin Diarrhea   Has patient had a PCN reaction causing immediate rash, facial/tongue/throat swelling, SOB or lightheadedness with hypotension: No Has patient had a PCN reaction causing severe rash involving mucus membranes or skin necrosis: No Has patient had a PCN reaction that required hospitalization: No Has patient had a PCN reaction occurring within the last 10 years: Yes If all of the above answers are "NO", then may proceed with Cephalosporin use.   Augmentin [amoxicillin-pot Clavulanate] Other (See Comments)   diarrhea   Farxiga [dapagliflozin] Diarrhea   Gi pain   Statins    Myalgia       Medication List        Accurate as of 05/08/18  1:11 PM. Always use your most recent med list.          ALPRAZolam 0.5 MG tablet Commonly known as:  XANAX Take 1 tablet (0.5 mg total) by mouth at bedtime as needed for anxiety.     aspirin 325 MG tablet Take 650-975 mg by mouth daily as needed  for moderate pain or headache.   azelastine 0.05 % ophthalmic solution Commonly known as:  OPTIVAR INSTILL 1 DROP IN AFFECTED EYE(S) DAILY   blood glucose meter kit and supplies Kit Dispense based on patient and insurance preference. Use up to four times daily as directed. (FOR ICD-9 250.00, 250.01).   budesonide 3 MG 24 hr capsule Commonly known as:  ENTOCORT EC TAKE 3 CAPSULES(9 MG) BY MOUTH DAILY   canagliflozin 100 MG Tabs tablet Commonly known as:  INVOKANA 1 tablet before breakfast   cyclobenzaprine 10 MG tablet Commonly known as:  FLEXERIL take 1 tablet by mouth at bedtime if needed   diphenhydrAMINE 25 mg capsule Commonly known as:  BENADRYL Take 25-50 mg by mouth daily as needed for allergies.   diphenhydrAMINE-zinc acetate cream Commonly known as:  BENADRYL Apply 1 application topically daily as needed for itching.   EFFEXOR XR 75 MG 24 hr capsule Generic drug:  venlafaxine XR TAKE ONE CAPSULE BY MOUTH TWICE DAILY WITH A MEAL   ferrous sulfate 325 (65 FE) MG tablet Take 325 mg by mouth 2 (two) times daily with a meal.   Fluticasone-Salmeterol 100-50 MCG/DOSE Aepb Commonly known as:  ADVAIR Inhale 1 puff into the lungs 2 (two) times daily.   furosemide 20 MG tablet Commonly known as:  LASIX Take 1 tablet (20 mg total) by mouth daily as needed.   glucose blood test strip Use as instructed to test once daily.   HAIR SKIN AND NAILS FORMULA Tabs Take 1 tablet by mouth daily.   lansoprazole 30 MG capsule Commonly known as:  PREVACID Take 1 capsule (30 mg total) by mouth daily.   LIVER PO Take 1 capsule by mouth 2 (two) times daily.   metFORMIN 500 MG tablet Commonly known as:  GLUCOPHAGE Take 500 mg by mouth 3 (three) times daily. TAKE 1 TABLET BY MOUTH 3 TIMES DAILY.   montelukast 10 MG tablet Commonly known as:  SINGULAIR TAKE 1 TABLET BY MOUTH AT BEDTIME.   ONE TOUCH ULTRA 2  w/Device Kit Use to check blood sugar   ONETOUCH DELICA LANCETS 60Y Misc USE UP TO FOUR TIMES DAILY AS DIRECTED   oxyCODONE-acetaminophen 5-325 MG tablet Commonly known as:  PERCOCET/ROXICET Take 0.5 tablets by mouth every 8 (eight) hours as needed for severe pain.   phenylephrine 10 MG Tabs tablet Commonly known as:  SUDAFED PE Take 10 mg by mouth every 4 (four) hours as needed.   promethazine 12.5 MG tablet Commonly known as:  PHENERGAN Take 1 tablet (12.5 mg total) by mouth every 8 (eight) hours as needed for nausea or vomiting.   pseudoephedrine 30 MG tablet Commonly known as:  SUDAFED Take 30 mg by mouth daily as needed for congestion.   sodium chloride inhaler solution Commonly known as:  BRONCHO SALINE Take 1 spray by nebulization as needed.   TRULICITY 1.5 TK/1.6WF Sopn Generic drug:  Dulaglutide INJECT ONCE WEEKLY AS DIRECTED       Allergies:  Allergies  Allergen Reactions  . Fentanyl Anxiety    Made her feel very anxious, requests not to get it.  . Levofloxacin Anxiety    Weird dreams   . Prednisone     Anxiety and paranoia  . Amoxicillin Diarrhea    Has patient had a PCN reaction causing immediate rash, facial/tongue/throat swelling, SOB or lightheadedness with hypotension: No Has patient had a PCN reaction causing severe rash involving mucus membranes or skin necrosis: No Has patient had a PCN reaction  that required hospitalization: No Has patient had a PCN reaction occurring within the last 10 years: Yes If all of the above answers are "NO", then may proceed with Cephalosporin use.   . Augmentin [Amoxicillin-Pot Clavulanate] Other (See Comments)    diarrhea  . Wilder Glade [Dapagliflozin] Diarrhea    Gi pain  . Statins     Myalgia     Past Medical History:  Diagnosis Date  . Allergy   . Anxiety   . Asthma   . Autoimmune hepatitis ([place]) 08/18/2016   08/2016 liver bx confirms, fibrosis not cirrhosis  . Depression   . DM (diabetes mellitus) ([place])     . 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 GERD (gastroesophageal reflux disease)   . Hiatal hernia   . Hx of adenomatous polyp of colon 07/07/2009  . Hyperlipidemia   . Hypertension   . IBS (irritable bowel syndrome)   . Iron deficiency anemia   . 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 Sinusitis, chronic   . Thyroid nodule   . Umbilical hernia   . Uterine fibroid   . Vascular disease   . Vitamin D deficiency     Past Surgical History:  Procedure Laterality Date  . CATARACT EXTRACTION Bilateral   . COLONOSCOPY  11/1991, 06/2009  . ESOPHAGOGASTRODUODENOSCOPY  02/2002  . ESOPHAGOGASTRODUODENOSCOPY (EGD) WITH PROPOFOL N/A 06/20/2017   Procedure: ESOPHAGOGASTRODUODENOSCOPY (EGD) WITH PROPOFOL;  Surgeon: Gatha Mayer, MD;  Location: [facility];  Service: Endoscopy;  Laterality: N/A;  with Barrx  . EYE SURGERY    . NASAL SEPTOPLASTY W/ TURBINOPLASTY    . TONSILLECTOMY      Family History  Problem Relation Age of Onset  . Stroke Mother   . Hypertension Mother   . Colon cancer Father   . Liver cancer Father   . Heart disease Sister        hole in heart  . Stroke Maternal Grandmother   . Hypertension Maternal Grandmother   . Esophageal cancer Maternal Grandfather   . Breast cancer Paternal Grandmother   . Brain cancer Paternal Grandmother   . Lung cancer Paternal Grandfather   . Diabetes Neg Hx     Social History:  reports that she has never smoked. She has never used smokeless tobacco. She reports that she drinks alcohol. She reports that she does not use drugs.   Review of Systems    Lipid history: Previously had tried Zocor which apparently caused muscle aches, she took Crestor and she thinks generic causes muscle cramps  She took Livalo for a couple of days but she thinks she didn't feel well with this and had some palpitations and does not want to take it again Currently on no treatment     Lab Results  Component Value  Date   CHOL 246 (H) 02/22/2017   HDL 50.50 02/22/2017   LDLCALC 174 (H) 02/22/2017   TRIG 108.0 02/22/2017   CHOLHDL 5 02/22/2017           Lab Results  Component Value Date   ALT 34 04/10/2018    Hypertension: None, she is taking Maxzide because of tendency to swelling in her abdomen    BP Readings from Last 3 Encounters:  05/08/18 130/82  04/12/18 128/80  02/21/18 132/86     Most recent eye exam was 9/19  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  Latest known visit with results is:  Lab on 04/12/2018  Component Date Value Ref Range Status  . Ferritin 04/12/2018 12.9  10.0 - 291.0 ng/mL Final  . WBC 04/12/2018 9.6  4.0 - 10.5 K/uL Final  . RBC 04/12/2018 4.20  3.87 - 5.11 Mil/uL Final  . Hemoglobin 04/12/2018 14.5  12.0 - 15.0 g/dL Final  . HCT 04/12/2018 40.8  36.0 - 46.0 % Final  . MCV 04/12/2018 97.3  78.0 - 100.0 fl Final  . MCHC 04/12/2018 35.4  30.0 - 36.0 g/dL Final  . RDW 04/12/2018 13.7  11.5 - 15.5 % Final  . Platelets 04/12/2018 166.0  150.0 - 400.0 K/uL Final  . Neutrophils Relative % 04/12/2018 68.9  43.0 - 77.0 % Final  . Lymphocytes Relative 04/12/2018 20.7  12.0 - 46.0 % Final  . Monocytes Relative 04/12/2018 8.0  3.0 - 12.0 % Final  . Eosinophils Relative 04/12/2018 1.3  0.0 - 5.0 % Final  . Basophils Relative 04/12/2018 1.1  0.0 - 3.0 % Final  . Neutro Abs 04/12/2018 6.6  1.4 - 7.7 K/uL Final  . Lymphs Abs 04/12/2018 2.0  0.7 - 4.0 K/uL Final  . Monocytes Absolute 04/12/2018 0.8  0.1 - 1.0 K/uL Final  . Eosinophils Absolute 04/12/2018 0.1  0.0 - 0.7 K/uL Final  . Basophils Absolute 04/12/2018 0.1  0.0 - 0.1 K/uL Final    Physical Examination:  BP 130/82 (BP Location: Left Arm, Patient Position: Sitting, Cuff Size: Normal)   Pulse 94   Ht _0  (1.676 m)   Wt 132 lb 3.2 oz (60 kg)   SpO2 96%   BMI 21.34 kg/m           ASSESSMENT:  Diabetes type 2, normal BMI of 23     See history of present illness for detailed discussion of current diabetes management, blood sugar patterns and problems identified  A1c has been usually lower than expected, last 6.2  Her blood sugars are again averaging over 200 at home She could not tolerate glipizide because of unusual side effects and no hypoglycemia She has tried several other medications Since she is already on metformin and a GLP-1 drug her only other option would be insulin or another SGLT2 drug Unlikely would benefit significantly from Actos or acarbose  She does not understand how insulin works and this was discussed Discussed that if she wants to switch completely to insulin this would be multiple injections otherwise may be able to add a basal insulin to her regimen  However since she had an unusual side effect of diarrhea with Wilder Glade may be able to tolerate a different SGLT2 drug   PLAN:   She will be given a trial of INVOKANA Discussed action of SGLT 2 drugs on lowering glucose by decreasing kidney absorption of glucose, benefits of weight loss and lower blood pressure, possible side effects including candidiasis and dosage regimen. This also may provide long-term benefits Need to have her check sugars more consistently She says she can try to do better with diet and exercise regimen and work out at the gym she has at work She will call us if she has any side effects of Invokana, given 30-day free trial card and may need prior authorization Check fructosamine and chemistry panel today She will continue Trulicity and metformin  A1c in the next visit  Lipids: Needs follow-up  Patient Instructions  Check blood sugars on waking up 4 days  a week  Also check blood sugars about 2 hours after meals and do this after different meals by rotation  Recommended blood sugar levels on waking up are 90-130 and about 2 hours after meal is 130-160  Please  bring your blood sugar monitor to each visit, thank you    Counseling time on subjects discussed in assessment and plan sections is over 50% of today's 25 minute visit    Elayne Snare 05/08/2018, 1:11 PM   Note: This office note was prepared with Dragon voice recognition system technology. Any transcriptional errors that result from this process are unintentional.

## 2018-05-08 ENCOUNTER — Encounter: Payer: Self-pay | Admitting: Endocrinology

## 2018-05-08 ENCOUNTER — Ambulatory Visit: Payer: 59 | Admitting: Endocrinology

## 2018-05-08 DIAGNOSIS — E1165 Type 2 diabetes mellitus with hyperglycemia: Secondary | ICD-10-CM

## 2018-05-08 DIAGNOSIS — E782 Mixed hyperlipidemia: Secondary | ICD-10-CM | POA: Diagnosis not present

## 2018-05-08 LAB — LIPID PANEL
CHOLESTEROL: 248 mg/dL — AB (ref 0–200)
HDL: 52.5 mg/dL (ref >39.00)
LDL Cholesterol: 170 mg/dL — ABNORMAL HIGH (ref 0–99)
NonHDL: 195.88
TRIGLYCERIDES: 128 mg/dL (ref 0.0–149.0)
Total CHOL/HDL Ratio: 5
VLDL: 25.6 mg/dL (ref 0.0–40.0)

## 2018-05-08 LAB — BASIC METABOLIC PANEL
BUN: 20 mg/dL (ref 6–23)
CHLORIDE: 98 meq/L (ref 96–112)
CO2: 26 mEq/L (ref 19–32)
CREATININE: 0.45 mg/dL (ref 0.40–1.20)
Calcium: 9.8 mg/dL (ref 8.4–10.5)
GFR: 148.15 mL/min (ref >60.00)
GLUCOSE: 149 mg/dL — AB (ref 70–99)
POTASSIUM: 3.6 meq/L (ref 3.5–5.1)
Sodium: 134 mEq/L — ABNORMAL LOW (ref 135–145)

## 2018-05-08 MED ORDER — CANAGLIFLOZIN 100 MG PO TABS: ORAL_TABLET | ORAL | 3 refills | Status: DC

## 2018-05-08 NOTE — Patient Instructions (Signed)
Check blood sugars on waking up 4  days a week  Also check blood sugars about 2 hours after meals and do this after different meals by rotation  Recommended blood sugar levels on waking up are 90-130 and about 2 hours after meal is 130-160  Please bring your blood sugar monitor to each visit, thank you  

## 2018-05-09 LAB — FRUCTOSAMINE: Fructosamine: 282 umol/L (ref 0–285)

## 2018-05-10 ENCOUNTER — Telehealth: Payer: Self-pay

## 2018-05-10 NOTE — Telephone Encounter (Signed)
Prior Authorization done over the phone with [facility] [phone]. Member # [id number]. I spoke with Kenney Houseman and got her BRAND name only Effexor approved from 04/10/18-05/10/2019. [facility] pharmacy informed. She cannot take the generic , it is ineffective.

## 2018-05-12 ENCOUNTER — Telehealth: Payer: Self-pay | Admitting: Adult Health

## 2018-05-12 NOTE — Telephone Encounter (Unsigned)
Copied from [id number]. Topic: Quick Communication - Rx Refill/Question >> May 12, 2018  3:18 PM Miranda Cochran wrote: Medication: metFORMIN (GLUCOPHAGE) 500 MG tablet   Has the patient contacted their pharmacy? Yes.   (Agent: If no, request that the patient contact the pharmacy for the refill.) (Agent: If yes, when and what did the pharmacy advise?)  Preferred [facility], [address] AT [place] [phone] (Phone) [phone] (Fax)    Agent: Please be advised that RX refills may take up to 3 business days. We ask that you follow-up with your pharmacy.

## 2018-05-16 ENCOUNTER — Other Ambulatory Visit: Payer: Self-pay | Admitting: Endocrinology

## 2018-05-16 DIAGNOSIS — E1165 Type 2 diabetes mellitus with hyperglycemia: Principal | ICD-10-CM

## 2018-05-16 DIAGNOSIS — IMO0001 Reserved for inherently not codable concepts without codable children: Secondary | ICD-10-CM

## 2018-05-18 ENCOUNTER — Other Ambulatory Visit: Payer: Self-pay

## 2018-05-18 DIAGNOSIS — I872 Venous insufficiency (chronic) (peripheral): Secondary | ICD-10-CM

## 2018-05-19 ENCOUNTER — Other Ambulatory Visit: Payer: Self-pay

## 2018-05-19 ENCOUNTER — Telehealth: Payer: Self-pay | Admitting: Endocrinology

## 2018-05-19 MED ORDER — METFORMIN HCL 500 MG PO TABS: 500.0000 mg | ORAL_TABLET | Freq: Three times a day (TID) | ORAL | 3 refills | Status: DC

## 2018-05-19 NOTE — Telephone Encounter (Signed)
rx sent

## 2018-05-19 NOTE — Telephone Encounter (Signed)
error 

## 2018-05-19 NOTE — Telephone Encounter (Signed)
MEDICATION: Metformin  PHARMACY:  [facility], [place] - [address] AT [address] & [address]  IS THIS A 90 DAY SUPPLY : no  IS PATIENT OUT OF MEDICATION:  yes IF NOT; HOW MUCH IS LEFT:   LAST APPOINTMENT DATE: @12 /08/2017  NEXT APPOINTMENT DATE:@Visit  date not found  OTHER COMMENTS:    **Let patient know to contact pharmacy at the end of the day to make sure medication is ready. **  ** Please notify patient to allow 48-72 hours to process**  **Encourage patient to contact the pharmacy for refills or they can request refills through [facility]**

## 2018-05-25 ENCOUNTER — Encounter: Payer: Self-pay | Admitting: Adult Health

## 2018-05-25 ENCOUNTER — Other Ambulatory Visit: Payer: Self-pay | Admitting: Endocrinology

## 2018-05-25 ENCOUNTER — Ambulatory Visit: Payer: 59 | Admitting: Adult Health

## 2018-05-25 ENCOUNTER — Encounter: Payer: Self-pay | Admitting: Family Medicine

## 2018-05-25 VITALS — BP 128/74 | Temp 99.0°F | Wt 132.0 lb

## 2018-05-25 DIAGNOSIS — J329 Chronic sinusitis, unspecified: Secondary | ICD-10-CM | POA: Diagnosis not present

## 2018-05-25 DIAGNOSIS — Z760 Encounter for issue of repeat prescription: Secondary | ICD-10-CM

## 2018-05-25 DIAGNOSIS — B9789 Other viral agents as the cause of diseases classified elsewhere: Secondary | ICD-10-CM | POA: Diagnosis not present

## 2018-05-25 MED ORDER — ALPRAZOLAM 0.5 MG PO TABS: 0.5000 mg | ORAL_TABLET | Freq: Every evening | ORAL | 0 refills | Status: DC | PRN

## 2018-05-25 NOTE — Progress Notes (Signed)
Subjective:    [name], female    DOB: 1951-03-05, 67 y.o.   MRN: [id number]  Sinusitis  This is a new problem. The current episode started yesterday. The problem is unchanged. There has been no fever. Associated symptoms include congestion and sinus pressure. Pertinent negatives include no chills, coughing, diaphoresis, ear pain, headaches, shortness of breath, sore throat or swollen glands. Treatments tried: Saline Spray, zyrtec, and Sudafed. The treatment provided mild relief.    She would also like refill of xanax that she takes PRN for anxiety and Percocet that she takes PRN for back pain and sinus pain   Review of Systems  Constitutional: Positive for activity change and fatigue. Negative for chills, diaphoresis and fever.  HENT: Positive for congestion and sinus pressure. Negative for ear pain, postnasal drip, rhinorrhea, sinus pain, sore throat, trouble swallowing and voice change.   Respiratory: Negative for cough and shortness of breath.   Cardiovascular: Negative.   Neurological: Negative for headaches.  Psychiatric/Behavioral: Negative.    Past Medical History:  Diagnosis Date  . Allergy   . Anxiety   . Asthma   . Autoimmune hepatitis ([place]) 08/18/2016   08/2016 liver bx confirms, fibrosis not cirrhosis  . Depression   . DM (diabetes mellitus) ([place])   . 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 GERD (gastroesophageal reflux disease)   . Hiatal hernia   . Hx of adenomatous polyp of colon 07/07/2009  . Hyperlipidemia   . Hypertension   . IBS (irritable bowel syndrome)   . Iron deficiency anemia   . 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 Sinusitis, chronic   . Thyroid nodule   . Umbilical hernia   . Uterine fibroid   . Vascular disease   . Vitamin D deficiency     Social History   Socioeconomic History  . Marital status: Divorced    Spouse name: Not on file  . Number of children: 0  . Years of  education: Not on file  . Highest education level: Not on file  Occupational History  . Occupation: Airline pilot    Comment: [employer]  Social Needs  . Financial resource strain: Not on file  . Food insecurity:    Worry: Not on file    Inability: Not on file  . Transportation needs:    Medical: Not on file    Non-medical: Not on file  Tobacco Use  . Smoking status: Never Smoker  . Smokeless tobacco: Never Used  Substance and Sexual Activity  . Alcohol use: Yes    Comment: rare  . Drug use: No  . Sexual activity: Not on file  Lifestyle  . Physical activity:    Days per week: Not on file    Minutes per session: Not on file  . Stress: Not on file  Relationships  . Social connections:    Talks on phone: Not on file    Gets together: Not on file    Attends religious service: Not on file    Active member of club or organization: Not on file    Attends meetings of clubs or organizations: Not on file    Relationship status: Not on file  . Intimate partner violence:    Fear of current or ex partner: Not on file    Emotionally abused: Not on file    Physically abused: Not on file    Forced sexual activity: Not on file  Other Topics Concern  . 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 CATARACT EXTRACTION Bilateral   . COLONOSCOPY  11/1991, 06/2009  . ESOPHAGOGASTRODUODENOSCOPY  02/2002  . ESOPHAGOGASTRODUODENOSCOPY (EGD) WITH PROPOFOL N/A 06/20/2017   Procedure: ESOPHAGOGASTRODUODENOSCOPY (EGD) WITH PROPOFOL;  Surgeon: Gatha Mayer, MD;  Location: [facility];  Service: Endoscopy;  Laterality: N/A;  with Barrx  . EYE SURGERY    . NASAL SEPTOPLASTY W/ TURBINOPLASTY    . TONSILLECTOMY      Family History  Problem Relation Age of Onset  . Stroke Mother   . Hypertension Mother   . Colon cancer Father   . Liver  cancer Father   . Heart disease Sister        hole in heart  . Stroke Maternal Grandmother   . Hypertension Maternal Grandmother   . Esophageal cancer Maternal Grandfather   . Breast cancer Paternal Grandmother   . Brain cancer Paternal Grandmother   . Lung cancer Paternal Grandfather   . Diabetes Neg Hx     Allergies  Allergen Reactions  . Fentanyl Anxiety    Made her feel very anxious, requests not to get it.  . Levofloxacin Anxiety    Weird dreams   . Prednisone     Anxiety and paranoia  . Amoxicillin Diarrhea    Has patient had a PCN reaction causing immediate rash, facial/tongue/throat swelling, SOB or lightheadedness with hypotension: No Has patient had a PCN reaction causing severe rash involving mucus membranes or skin necrosis: No Has patient had a PCN reaction that required hospitalization: No Has patient had a PCN reaction occurring within the last 10 years: Yes If all of the above answers are "NO", then may proceed with Cephalosporin use.   . Augmentin [Amoxicillin-Pot Clavulanate] Other (See Comments)    diarrhea  . Wilder Glade [Dapagliflozin] Diarrhea    Gi pain  . Statins     Myalgia     Current Outpatient Medications on File Prior to Visit  Medication Sig Dispense Refill  . aspirin 325 MG tablet Take 650-975 mg by mouth daily as needed for moderate pain or headache.     Marland Kitchen azelastine (OPTIVAR) 0.05 % ophthalmic solution INSTILL 1 DROP IN AFFECTED EYE(S) DAILY 6 mL 0  . blood glucose meter kit and supplies KIT Dispense based on patient and insurance preference. Use up to four times daily as directed. (FOR ICD-9 250.00, 250.01). 1 each 0  . Blood Glucose Monitoring Suppl (ONE TOUCH ULTRA 2) w/Device KIT Use to check blood sugar 1 each 0  . budesonide (ENTOCORT EC) 3 MG 24 hr capsule TAKE 3 CAPSULES(9 MG) BY MOUTH DAILY 90 capsule 3  . canagliflozin (INVOKANA) 100 MG TABS tablet 1 tablet before breakfast 30 tablet 3  . cyclobenzaprine (FLEXERIL) 10 MG tablet take 1  tablet by mouth at bedtime if needed (Patient taking differently: Take 5-10 mg by mouth daily as needed for muscle spasms. ) 30 tablet 5  . diphenhydrAMINE (BENADRYL) 25 mg capsule Take 25-50 mg by mouth daily as needed for allergies.     . diphenhydrAMINE-zinc acetate (BENADRYL) cream Apply 1 application topically daily as needed for itching.    . EFFEXOR XR 75 MG 24 hr capsule TAKE ONE CAPSULE BY MOUTH TWICE DAILY WITH A MEAL 60 capsule 5  . ferrous sulfate 325 (65 FE) MG tablet  Take 325 mg by mouth 2 (two) times daily with a meal.    . Fluticasone-Salmeterol (ADVAIR DISKUS) 100-50 MCG/DOSE AEPB Inhale 1 puff into the lungs 2 (two) times daily. (Patient taking differently: Inhale 1 puff into the lungs daily as needed (shortness of breath). ) 60 each 11  . furosemide (LASIX) 20 MG tablet Take 1 tablet (20 mg total) by mouth daily as needed. 30 tablet 5  . glucose blood test strip Use as instructed to test once daily. 100 each 12  . Lancets (ONETOUCH DELICA PLUS [id number]) MISC USE UP TO FOUR TIMES DAILY AS DIRECTED 100 each 0  . lansoprazole (PREVACID) 30 MG capsule Take 1 capsule (30 mg total) by mouth daily. 90 capsule 1  . Liver Extract (LIVER PO) Take 1 capsule by mouth 2 (two) times daily.    . metFORMIN (GLUCOPHAGE) 500 MG tablet Take 1 tablet (500 mg total) by mouth 3 (three) times daily. TAKE 1 TABLET BY MOUTH 3 TIMES DAILY. 90 tablet 3  . montelukast (SINGULAIR) 10 MG tablet TAKE 1 TABLET BY MOUTH AT BEDTIME. 90 tablet 0  . Multiple Vitamins-Minerals (HAIR SKIN AND NAILS FORMULA) TABS Take 1 tablet by mouth daily.    Marland Kitchen oxyCODONE-acetaminophen (PERCOCET/ROXICET) 5-325 MG tablet Take 0.5 tablets by mouth every 8 (eight) hours as needed for severe pain. 30 tablet 0  . phenylephrine (SUDAFED PE) 10 MG TABS tablet Take 10 mg by mouth every 4 (four) hours as needed.    . promethazine (PHENERGAN) 12.5 MG tablet Take 1 tablet (12.5 mg total) by mouth every 8 (eight) hours as needed for nausea or  vomiting. 20 tablet 11  . pseudoephedrine (SUDAFED) 30 MG tablet Take 30 mg by mouth daily as needed for congestion.     . sodium chloride (BRONCHO SALINE) inhaler solution Take 1 spray by nebulization as needed.     No current facility-administered medications on file prior to visit.     BP 128/74   Temp 99 F (37.2 C)   Wt 132 lb (59.9 kg)   BMI 21.31 kg/m       Objective:   Physical Exam Vitals signs and nursing note reviewed.  Constitutional:      Appearance: Normal appearance.  HENT:     Head: Normocephalic.     Right Ear: Ear canal and external ear normal. A middle ear effusion is present. Tympanic membrane is not erythematous or bulging.     Left Ear: Ear canal and external ear normal. A middle ear effusion is present. Tympanic membrane is not erythematous or bulging.     Nose: No mucosal edema, congestion or rhinorrhea.     Right Sinus: Maxillary sinus tenderness present.     Left Sinus: Maxillary sinus tenderness present.     Mouth/Throat:     Mouth: Mucous membranes are moist.     Pharynx: Oropharynx is clear. No oropharyngeal exudate or posterior oropharyngeal erythema.  Cardiovascular:     Rate and Rhythm: Normal rate and regular rhythm.  Pulmonary:     Effort: Pulmonary effort is normal.     Breath sounds: Normal breath sounds.  Skin:    General: Skin is warm and dry.  Neurological:     General: No focal deficit present.     Mental Status: She is alert and oriented to person, place, and time. Mental status is at baseline.  Psychiatric:        Mood and Affect: Mood normal.        Behavior:  Behavior normal.        Thought Content: Thought content normal.        Judgment: Judgment normal.       Assessment & Plan:  1. Viral sinusitis - Likely viral but informed patient that it is too early to tell. Will hold off on treating. Advised Flonase.  - Rest and hydrate  - Follow up if no improvement in the next 3-4 days or sooner if fever develops   2.  Medication refill - Advised patient that I would be unable to fill Percocet. I will send in a short course of xanax to get her through. She will need to follow up with new PCP to establish Care  - ALPRAZolam (XANAX) 0.5 MG tablet; Take 1 tablet (0.5 mg total) by mouth at bedtime as needed for anxiety.  Dispense: 30 tablet; Refill: 0  Dorothyann Peng, NP

## 2018-05-30 ENCOUNTER — Ambulatory Visit: Payer: Self-pay

## 2018-05-30 ENCOUNTER — Other Ambulatory Visit: Payer: Self-pay | Admitting: Adult Health

## 2018-05-30 MED ORDER — AZITHROMYCIN 250 MG PO TABS: ORAL_TABLET | ORAL | 0 refills | Status: DC

## 2018-05-30 NOTE — Telephone Encounter (Signed)
Z-pack sent to pharmacy

## 2018-05-30 NOTE — Telephone Encounter (Signed)
Patient called in with c/o "sinus congestion." She says "I was just seen in the office 5 days ago by [place] and he said I had a viral sinus infection, which I agreed. Somewhere between yesterday and today, it has turn into a bacterial infection. On Sunday, I had 2 nose bleeds and this morning at 0530 I had one and I know this is from the infection. I have yellow, green and bloody drainage. I have pain to the right side of my face with swelling since yesterday, right ear is stopped up. The pain is a 8. I had this same thing earlier this year and Z-Pack is the only antibiotic that works for me. If he would call that in, I would greatly appreciate it." I advised I will send this to [facility] for review and someone will call back with his recommendation, patient verbalized understanding.  Reason for Disposition . [1] Sinus pain (not just congestion) AND [2] fever  Answer Assessment - Initial Assessment Questions 1. LOCATION: "Where does it hurt?"      The right side of face, forehead 2. ONSET: "When did the sinus pain start?"  (e.g., hours, days)      Woke up with the facial pain this morning 3. SEVERITY: "How bad is the pain?"   (Scale 1-10; mild, moderate or severe)   - MILD (1-3): doesn't interfere with normal activities    - MODERATE (4-7): interferes with normal activities (e.g., work or school) or awakens from sleep   - SEVERE (8-10): excruciating pain and patient unable to do any normal activities        8 4. RECURRENT SYMPTOM: "Have you ever had sinus problems before?" If so, ask: "When was the last time?" and "What happened that time?"      Yes; the last time was earlier this year; Z-pack was ordered 5. NASAL CONGESTION: "Is the nose blocked?" If so, ask, "Can you open it or must you breathe through the mouth?"     Right side is blocked, left side open 6. NASAL DISCHARGE: "Do you have discharge from your nose?" If so ask, "What color?"     Nose bleed 2 on Sunday and 1 this morning at 0530, yellow,  green and bloody drainage 7. FEVER: "Do you have a fever?" If so, ask: "What is it, how was it measured, and when did it start?"      Felt warm yesterday and today 8. OTHER SYMPTOMS: "Do you have any other symptoms?" (e.g., sore throat, cough, earache, difficulty breathing)    Right ear stopped up, sinus drainage causing cough and throat irritation, right side face is swollen 9. PREGNANCY: "Is there any chance you are pregnant?" "When was your last menstrual period?"     No  Protocols used: SINUS PAIN OR CONGESTION-A-AH

## 2018-05-30 NOTE — Telephone Encounter (Signed)
[facility] notified that rx has been sent to the pharmacy.

## 2018-06-06 ENCOUNTER — Inpatient Hospital Stay (HOSPITAL_COMMUNITY): Admission: RE | Admit: 2018-06-06 | Payer: 59 | Source: Ambulatory Visit

## 2018-06-06 ENCOUNTER — Encounter: Payer: 59 | Admitting: Vascular Surgery

## 2018-06-22 ENCOUNTER — Other Ambulatory Visit: Payer: Self-pay | Admitting: Endocrinology

## 2018-06-22 ENCOUNTER — Other Ambulatory Visit (INDEPENDENT_AMBULATORY_CARE_PROVIDER_SITE_OTHER): Payer: 59

## 2018-06-22 DIAGNOSIS — D500 Iron deficiency anemia secondary to blood loss (chronic): Secondary | ICD-10-CM

## 2018-06-22 DIAGNOSIS — K754 Autoimmune hepatitis: Secondary | ICD-10-CM

## 2018-06-22 LAB — HEPATIC FUNCTION PANEL
ALBUMIN: 4.2 g/dL (ref 3.5–5.2)
ALK PHOS: 98 U/L (ref 39–117)
ALT: 40 U/L — ABNORMAL HIGH (ref 0–35)
AST: 39 U/L — ABNORMAL HIGH (ref 0–37)
Bilirubin, Direct: 0.2 mg/dL (ref 0.0–0.3)
Total Bilirubin: 0.9 mg/dL (ref 0.2–1.2)
Total Protein: 8 g/dL (ref 6.0–8.3)

## 2018-06-22 LAB — CBC
HCT: 45.4 % (ref 36.0–46.0)
Hemoglobin: 15.4 g/dL — ABNORMAL HIGH (ref 12.0–15.0)
MCHC: 33.9 g/dL (ref 30.0–36.0)
MCV: 99 fl (ref 78.0–100.0)
PLATELETS: 244 10*3/uL (ref 150.0–400.0)
RBC: 4.59 Mil/uL (ref 3.87–5.11)
RDW: 14.5 % (ref 11.5–15.5)
WBC: 10.8 10*3/uL — ABNORMAL HIGH (ref 4.0–10.5)

## 2018-06-22 LAB — FERRITIN: FERRITIN: 13.7 ng/mL (ref 10.0–291.0)

## 2018-06-24 NOTE — Progress Notes (Signed)
Liver chemistries slightly elevated Hgb good but iron is low (still)  I would like her to schedule a next available f/u so we can review things

## 2018-06-28 ENCOUNTER — Other Ambulatory Visit: Payer: Self-pay | Admitting: Family Medicine

## 2018-06-28 DIAGNOSIS — J40 Bronchitis, not specified as acute or chronic: Secondary | ICD-10-CM

## 2018-06-29 ENCOUNTER — Telehealth: Payer: Self-pay | Admitting: Internal Medicine

## 2018-06-29 MED ORDER — PROMETHAZINE HCL 12.5 MG PO TABS: 12.5000 mg | ORAL_TABLET | Freq: Three times a day (TID) | ORAL | 1 refills | Status: DC | PRN

## 2018-06-29 NOTE — Telephone Encounter (Signed)
Miranda Cochran needs a refill on her generic phenergan. She uses it prn. She has an appointment with [place] on 08/03/2018. I'm sending this in to cover until her appointment.  [name] I'm not sure who denied it because I don't see anything in the system.

## 2018-07-04 ENCOUNTER — Other Ambulatory Visit (INDEPENDENT_AMBULATORY_CARE_PROVIDER_SITE_OTHER): Payer: 59

## 2018-07-04 DIAGNOSIS — E1165 Type 2 diabetes mellitus with hyperglycemia: Secondary | ICD-10-CM

## 2018-07-04 LAB — COMPREHENSIVE METABOLIC PANEL
ALBUMIN: 4 g/dL (ref 3.5–5.2)
ALT: 34 U/L (ref 0–35)
AST: 38 U/L — ABNORMAL HIGH (ref 0–37)
Alkaline Phosphatase: 81 U/L (ref 39–117)
BUN: 14 mg/dL (ref 6–23)
CALCIUM: 9.5 mg/dL (ref 8.4–10.5)
CHLORIDE: 101 meq/L (ref 96–112)
CO2: 30 mEq/L (ref 19–32)
Creatinine, Ser: 0.63 mg/dL (ref 0.40–1.20)
GFR: 94.49 mL/min (ref >60.00)
Glucose, Bld: 138 mg/dL — ABNORMAL HIGH (ref 70–99)
POTASSIUM: 4.4 meq/L (ref 3.5–5.1)
Sodium: 136 mEq/L (ref 135–145)
Total Bilirubin: 0.9 mg/dL (ref 0.2–1.2)
Total Protein: 7.4 g/dL (ref 6.0–8.3)

## 2018-07-04 LAB — HEMOGLOBIN A1C: HEMOGLOBIN A1C: 5.5 % (ref 4.6–6.5)

## 2018-07-07 ENCOUNTER — Encounter: Payer: Self-pay | Admitting: Endocrinology

## 2018-07-07 ENCOUNTER — Telehealth: Payer: Self-pay | Admitting: Internal Medicine

## 2018-07-07 ENCOUNTER — Ambulatory Visit: Payer: 59 | Admitting: Endocrinology

## 2018-07-07 VITALS — BP 142/88 | HR 81 | Ht 66.0 in | Wt 127.0 lb

## 2018-07-07 DIAGNOSIS — E1165 Type 2 diabetes mellitus with hyperglycemia: Secondary | ICD-10-CM | POA: Diagnosis not present

## 2018-07-07 MED ORDER — ERTUGLIFLOZIN L-PYROGLUTAMICAC 5 MG PO TABS: 5.0000 mg | ORAL_TABLET | Freq: Every day | ORAL | 3 refills | Status: DC

## 2018-07-07 NOTE — Progress Notes (Signed)
[name], female   DOB: 1950-06-20, 68 y.o.   MRN: [id number]          Reason for Appointment: Follow-up for Type 2 Diabetes    History of Present Illness:          Date of diagnosis of type 2 diabetes mellitus: ?  2013        Background history:   She had a blood sugar over 200 in 2013 but was not told to have diabetes until 04/2015 A1c was 7.3 in 3/16 In 04/2015 blood sugar was 478 says her blood sugar was 600 when she was diagnosed and was not having any significant symptoms Initially treated with Metaglip but this apparently caused abdominal discomfort and she was switched to metformin In 2017 she had been tried on Bydureon for better control but she stopped this after a few weeks because of large persisted nodules in her skin She thinks her blood sugars were better with this and A1c was down to 6.8  Recent history:   Non-insulin hypoglycemic drugs the patient is taking are: Trulicity 1.5 mg weekly, INVOKANA 100 mg daily and metformin 1500 mg   Her A1c in September was 6.2 and now 5.5 Fructosamine is 282 in 11/19, previously 269  Current management, blood sugar patterns and problems identified:  Her blood sugars were averaging just over 200 on her last visit in 11/19 and INVOKANA was added to her Trulicity and metformin  However she thinks that even though her blood sugars are better she feels that her appetite is markedly decreased with Invokana and has occasional nausea with this  Taking this in the morning  Her blood sugars are overall better but she is checking infrequently and mostly in the morning  Still has readings as high as 292 when she eats more carbohydrates during the day  She has lost 5 pounds  Fasting readings have been as high as 190 but she thinks they are only improved when she is less at evening meal the night before  Overall has only 1 normal reading in the morning  She is reluctant to go on insulin  Does not do any formal  exercise, only occasional walking        Side effects from medications have been: Metaglip caused abdominal pain, Tradjenta caused swelling of the legs, Bydureon caused skin nodules, Farxiga caused diarrhea, glipizide causes dizziness and weakness  Compliance with the medical regimen: Inconsistent  Glucose monitoring:  done less than 1 times a day         Glucometer: One Touch ultra mini.      Blood Glucose readings by time of day by review of monitor:   PRE-MEAL Fasting Lunch Dinner Bedtime Overall  Glucose range:  96-190  228  159, 191    Mean/median:        POST-MEAL PC Breakfast PC Lunch PC Dinner  Glucose range:    292  Mean/median:      PREVIOUS readings:  PRE-MEAL Fasting Lunch  5-7 PM Bedtime Overall  Glucose range:  162-304  165-353  201-254    Mean/median:  199     205+/-63   POST-MEAL PC Breakfast PC Lunch PC Dinner  Glucose range:     Mean/median:   263        Self-care:  Typical meal intake: Breakfast is Oatmeal, usually eating low-fat dinner and her snacks will be fruit, peanut butter crackers  Dietician visit, most recent: None               Exercise: walking occasionally  Weight history:  Wt Readings from Last 3 Encounters:  07/07/18 127 lb (57.6 kg)  05/25/18 132 lb (59.9 kg)  05/08/18 132 lb 3.2 oz (60 kg)    Glycemic control:   Lab Results  Component Value Date   HGBA1C 5.5 07/04/2018   HGBA1C 6.2 02/17/2018   HGBA1C 5.9 07/08/2017   Lab Results  Component Value Date   MICROALBUR 8.2 (H) 07/12/2017   LDLCALC 170 (H) 05/08/2018   CREATININE 0.63 07/04/2018   Lab Results  Component Value Date   MICRALBCREAT 14.7 07/12/2017    Lab Results  Component Value Date   FRUCTOSAMINE 282 05/08/2018   FRUCTOSAMINE 269 10/07/2017   FRUCTOSAMINE 266 07/08/2017    Lab on 07/04/2018  Component Date Value Ref Range Status  . Sodium 07/04/2018 136  135 - 145 mEq/L Final  . Potassium 07/04/2018 4.4  3.5 - 5.1 mEq/L Final  .  Chloride 07/04/2018 101  96 - 112 mEq/L Final  . CO2 07/04/2018 30  19 - 32 mEq/L Final  . Glucose, Bld 07/04/2018 138* 70 - 99 mg/dL Final  . BUN 07/04/2018 14  6 - 23 mg/dL Final  . Creatinine, Ser 07/04/2018 0.63  0.40 - 1.20 mg/dL Final  . Total Bilirubin 07/04/2018 0.9  0.2 - 1.2 mg/dL Final  . Alkaline Phosphatase 07/04/2018 81  39 - 117 U/L Final  . AST 07/04/2018 38* 0 - 37 U/L Final  . ALT 07/04/2018 34  0 - 35 U/L Final  . Total Protein 07/04/2018 7.4  6.0 - 8.3 g/dL Final  . Albumin 07/04/2018 4.0  3.5 - 5.2 g/dL Final  . Calcium 07/04/2018 9.5  8.4 - 10.5 mg/dL Final  . GFR 07/04/2018 94.49  >60.00 mL/min Final  . Hgb A1c MFr Bld 07/04/2018 5.5  4.6 - 6.5 % Final   Glycemic Control Guidelines for People with Diabetes:Non Diabetic:  <6%Goal of Therapy: <7%Additional Action Suggested:  >8%      Allergies as of 07/07/2018      Reactions   Fentanyl Anxiety   Made her feel very anxious, requests not to get it.   Levofloxacin Anxiety   Weird dreams    Prednisone    Anxiety and paranoia   Amoxicillin Diarrhea   Has patient had a PCN reaction causing immediate rash, facial/tongue/throat swelling, SOB or lightheadedness with hypotension: No Has patient had a PCN reaction causing severe rash involving mucus membranes or skin necrosis: No Has patient had a PCN reaction that required hospitalization: No Has patient had a PCN reaction occurring within the last 10 years: Yes If all of the above answers are "NO", then may proceed with Cephalosporin use.   Augmentin [amoxicillin-pot Clavulanate] Other (See Comments)   diarrhea   Farxiga [dapagliflozin] Diarrhea   Gi pain   Statins    Myalgia       Medication List       Accurate as of July 07, 2018 11:59 PM. Always use your most recent med list.        ALPRAZolam 0.5 MG tablet Commonly known as:  XANAX Take 1 tablet (0.5 mg total) by mouth at bedtime as needed for anxiety.   aspirin 325 MG tablet Take 650-975 mg by  mouth daily as needed for moderate pain or headache.   azelastine 0.05 % ophthalmic solution Commonly known as:  OPTIVAR INSTILL 1  DROP IN AFFECTED EYE(S) DAILY   azithromycin 250 MG tablet Commonly known as:  ZITHROMAX Z-PAK Take 2 tablets on Day 1.  Then take 1 tablet daily.   blood glucose meter kit and supplies Kit Dispense based on patient and insurance preference. Use up to four times daily as directed. (FOR ICD-9 250.00, 250.01).   budesonide 3 MG 24 hr capsule Commonly known as:  ENTOCORT EC TAKE 3 CAPSULES(9 MG) BY MOUTH DAILY   canagliflozin 100 MG Tabs tablet Commonly known as:  INVOKANA 1 tablet before breakfast   cyclobenzaprine 10 MG tablet Commonly known as:  FLEXERIL take 1 tablet by mouth at bedtime if needed   diphenhydrAMINE 25 mg capsule Commonly known as:  BENADRYL Take 25-50 mg by mouth daily as needed for allergies.   diphenhydrAMINE-zinc acetate cream Commonly known as:  BENADRYL Apply 1 application topically daily as needed for itching.   EFFEXOR XR 75 MG 24 hr capsule Generic drug:  venlafaxine XR TAKE ONE CAPSULE BY MOUTH TWICE DAILY WITH A MEAL   Ertugliflozin L-PyroglutamicAc 5 MG Tabs Commonly known as:  STEGLATRO Take 5 mg by mouth daily with breakfast.   ferrous sulfate 325 (65 FE) MG tablet Take 325 mg by mouth 2 (two) times daily with a meal.   Fluticasone-Salmeterol 100-50 MCG/DOSE Aepb Commonly known as:  ADVAIR DISKUS Inhale 1 puff into the lungs 2 (two) times daily.   furosemide 20 MG tablet Commonly known as:  LASIX Take 1 tablet (20 mg total) by mouth daily as needed.   glucose blood test strip Use as instructed to test once daily.   HAIR SKIN AND NAILS FORMULA Tabs Take 1 tablet by mouth daily.   lansoprazole 30 MG capsule Commonly known as:  PREVACID Take 1 capsule (30 mg total) by mouth daily.   LIVER PO Take 1 capsule by mouth 2 (two) times daily.   metFORMIN 500 MG tablet Commonly known as:   GLUCOPHAGE Take 1 tablet (500 mg total) by mouth 3 (three) times daily. TAKE 1 TABLET BY MOUTH 3 TIMES DAILY.   montelukast 10 MG tablet Commonly known as:  SINGULAIR TAKE 1 TABLET BY MOUTH AT BEDTIME.   ONE TOUCH ULTRA 2 w/Device Kit Use to check blood sugar   ONETOUCH DELICA PLUS [id number] Misc USE UP TO FOUR TIMES DAILY AS DIRECTED   oxyCODONE-acetaminophen 5-325 MG tablet Commonly known as:  PERCOCET/ROXICET Take 0.5 tablets by mouth every 8 (eight) hours as needed for severe pain.   phenylephrine 10 MG Tabs tablet Commonly known as:  SUDAFED PE Take 10 mg by mouth every 4 (four) hours as needed.   promethazine 12.5 MG tablet Commonly known as:  PHENERGAN Take 1 tablet (12.5 mg total) by mouth every 8 (eight) hours as needed for nausea or vomiting.   pseudoephedrine 30 MG tablet Commonly known as:  SUDAFED Take 30 mg by mouth daily as needed for congestion.   sodium chloride inhaler solution Commonly known as:  BRONCHO SALINE Take 1 spray by nebulization as needed.   TRULICITY 1.5 WU/9.8JX Sopn Generic drug:  Dulaglutide INJECT 1 DOSE ONCE WEEKLY AS DIRECTED       Allergies:  Allergies  Allergen Reactions  . Fentanyl Anxiety    Made her feel very anxious, requests not to get it.  . Levofloxacin Anxiety    Weird dreams   . Prednisone     Anxiety and paranoia  . Amoxicillin Diarrhea    Has patient had a PCN reaction causing immediate  rash, facial/tongue/throat swelling, SOB or lightheadedness with hypotension: No Has patient had a PCN reaction causing severe rash involving mucus membranes or skin necrosis: No Has patient had a PCN reaction that required hospitalization: No Has patient had a PCN reaction occurring within the last 10 years: Yes If all of the above answers are "NO", then may proceed with Cephalosporin use.   . Augmentin [Amoxicillin-Pot Clavulanate] Other (See Comments)    diarrhea  . [place] [Dapagliflozin] Diarrhea    Gi pain  . Statins      Myalgia     Past Medical History:  Diagnosis Date  . Allergy   . Anxiety   . Asthma   . Autoimmune hepatitis ([place]) 08/18/2016   08/2016 liver bx confirms, fibrosis not cirrhosis  . Depression   . DM (diabetes mellitus) ([place])   . 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 GERD (gastroesophageal reflux disease)   . Hiatal hernia   . Hx of adenomatous polyp of colon 07/07/2009  . Hyperlipidemia   . Hypertension   . IBS (irritable bowel syndrome)   . Iron deficiency anemia   . 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 Sinusitis, chronic   . Thyroid nodule   . Umbilical hernia   . Uterine fibroid   . Vascular disease   . Vitamin D deficiency     Past Surgical History:  Procedure Laterality Date  . CATARACT EXTRACTION Bilateral   . COLONOSCOPY  11/1991, 06/2009  . ESOPHAGOGASTRODUODENOSCOPY  02/2002  . ESOPHAGOGASTRODUODENOSCOPY (EGD) WITH PROPOFOL N/A 06/20/2017   Procedure: ESOPHAGOGASTRODUODENOSCOPY (EGD) WITH PROPOFOL;  Surgeon: Gatha Mayer, MD;  Location: [facility];  Service: Endoscopy;  Laterality: N/A;  with Barrx  . EYE SURGERY    . NASAL SEPTOPLASTY W/ TURBINOPLASTY    . TONSILLECTOMY      Family History  Problem Relation Age of Onset  . Stroke Mother   . Hypertension Mother   . Colon cancer Father   . Liver cancer Father   . Heart disease Sister        hole in heart  . Stroke Maternal Grandmother   . Hypertension Maternal Grandmother   . Esophageal cancer Maternal Grandfather   . Breast cancer Paternal Grandmother   . Brain cancer Paternal Grandmother   . Lung cancer Paternal Grandfather   . Diabetes Neg Hx     Social History:  reports that she has never smoked. She has never used smokeless tobacco. She reports current alcohol use. She reports that she does not use drugs.   Review of Systems    Lipid history: Previously had tried Zocor which apparently caused muscle aches, she took Crestor and she thinks  generic causes muscle cramps She took Livalo for a couple of days but she thinks she didn't feel well with this and had some palpitations and refuses to take it again Currently on no treatment     Lab Results  Component Value Date   CHOL 248 (H) 05/08/2018   HDL 52.50 05/08/2018   LDLCALC 170 (H) 05/08/2018   TRIG 128.0 05/08/2018   CHOLHDL 5 05/08/2018           Lab Results  Component Value Date   ALT 34 07/04/2018    Hypertension: None, she is taking Maxzide because of tendency to swelling in her abdomen  She is anxious today and blood pressure is higher  BP Readings from Last 3 Encounters:  07/07/18 (!) 142/88  05/25/18  128/74  05/08/18 130/82     Most recent eye exam was 9/19  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 Sodium 07/04/2018 136  135 - 145 mEq/L Final  . Potassium 07/04/2018 4.4  3.5 - 5.1 mEq/L Final  . Chloride 07/04/2018 101  96 - 112 mEq/L Final  . CO2 07/04/2018 30  19 - 32 mEq/L Final  . Glucose, Bld 07/04/2018 138* 70 - 99 mg/dL Final  . BUN 07/04/2018 14  6 - 23 mg/dL Final  . Creatinine, Ser 07/04/2018 0.63  0.40 - 1.20 mg/dL Final  . Total Bilirubin 07/04/2018 0.9  0.2 - 1.2 mg/dL Final  . Alkaline Phosphatase 07/04/2018 81  39 - 117 U/L Final  . AST 07/04/2018 38* 0 - 37 U/L Final  . ALT 07/04/2018 34  0 - 35 U/L Final  . Total Protein 07/04/2018 7.4  6.0 - 8.3 g/dL Final  . Albumin 07/04/2018 4.0  3.5 - 5.2 g/dL Final  . Calcium 07/04/2018 9.5  8.4 - 10.5 mg/dL Final  . GFR 07/04/2018 94.49  >60.00 mL/min Final  . Hgb A1c MFr Bld 07/04/2018 5.5  4.6 - 6.5 % Final   Glycemic Control Guidelines for People with Diabetes:Non Diabetic:  <6%Goal of Therapy: <7%Additional Action Suggested:  >8%     Physical Examination:  BP (!) 142/88 (BP Location: Left Arm, Patient Position: Sitting, Cuff Size: Normal)   Pulse 81   Ht '5\' 6"'  (1.676 m)    Wt 127 lb (57.6 kg)   SpO2 98%   BMI 20.50 kg/m          ASSESSMENT:  Diabetes type 2, normal BMI of 23     See history of present illness for detailed discussion of current diabetes management, blood sugar patterns and problems identified  A1c has been usually lower than expected, now 5.5 although previously higher at 6.2 With adding Invokana her blood sugars are better but not at target  She thinks she is having anorexia and weight loss from [place] and is not wanting to continue this She has numerous intolerances to medication Currently is able to take metformin and Trulicity with fairly good tolerance  Discussed that besides insulin other option would be another SGLT2 drug although she is likely getting more insulin deficient Unlikely would benefit significantly from Actos Will not likely tolerate acarbose because of her persistent GI problems Also has no consistent blood sugar patterns especially with her variable diet and infrequent monitoring after meals   PLAN:   She will be given a trial of Steglatro Discussed action of this compared to Invokana Since she is having some nausea she can take this with food  She will continue Trulicity and metformin unchanged  Fructosamine on the next visit  Lipids: Untreated and is statin intolerant  Patient Instructions  Check blood sugars on waking up 3 days a week  Also check blood sugars about 2 hours after meals and do this after different meals by rotation  Recommended blood sugar levels on waking up are 90-130 and about 2 hours after meal is 130-160  Please bring your blood sugar monitor to each visit, thank you         Elayne Snare 07/09/2018, 3:44 PM   Note: This office note was prepared with Dragon voice recognition system technology. Any transcriptional errors that result from this process are unintentional.

## 2018-07-07 NOTE — Telephone Encounter (Signed)
Patient wants to discuss next weeks appointment with nurse.

## 2018-07-07 NOTE — Patient Instructions (Signed)
Check blood sugars on waking up 3 days a week  Also check blood sugars about 2 hours after meals and do this after different meals by rotation  Recommended blood sugar levels on waking up are 90-130 and about 2 hours after meal is 130-160  Please bring your blood sugar monitor to each visit, thank you   

## 2018-07-07 NOTE — Telephone Encounter (Signed)
Pt return call transferred to VM

## 2018-07-07 NOTE — Telephone Encounter (Signed)
Left message for patient to call back  

## 2018-07-07 NOTE — Telephone Encounter (Signed)
Patient does not feel appt Dr. Carlean Purl has recommended is necessary.  She had me cancel.  She would like to know why, specifically, Dr. Carlean Purl wants to see her.  I cancelled the appt as she requested.  She is asking about B12 shots "because I am tired".  I encouraged her to send a MyChart message to Dr. Carlean Purl. I asked that she ask her very specific questions and that I will have Dr. Carlean Purl look and respond when he returns. Marland Kitchen

## 2018-07-14 NOTE — Telephone Encounter (Signed)
My thoughts are: 1) Ferritin is low again and she will either need feraheme again and repeat EGD and more ablation of GAVE or both 2) Low ferritin may explain some of fatigue 3) B12 > 400 2018 so very unlikely that is low and no indication for B12 shots that I can see 4) We can arrange an EGD and feraheme x 2 for iron def anemia from chronic blood loss anemiawithout an office visit if she is willing and ready and my schedule allows EGD) 5) May see PCP about fatigue also as there are many causes of fatigue  This is as much as I can do without discussing in person

## 2018-07-14 NOTE — Telephone Encounter (Signed)
Left message for patient to call back  

## 2018-07-17 NOTE — Telephone Encounter (Signed)
Patient notified She has rescheduled the office visit. She will discuss more at the office visit.   She does not wish to proceed with EGD or Feraheme.

## 2018-07-18 ENCOUNTER — Other Ambulatory Visit: Payer: Self-pay | Admitting: Endocrinology

## 2018-07-26 ENCOUNTER — Other Ambulatory Visit: Payer: Self-pay | Admitting: Internal Medicine

## 2018-07-26 DIAGNOSIS — K219 Gastro-esophageal reflux disease without esophagitis: Secondary | ICD-10-CM

## 2018-07-28 ENCOUNTER — Other Ambulatory Visit: Payer: Self-pay

## 2018-07-28 ENCOUNTER — Ambulatory Visit: Payer: 59 | Admitting: Vascular Surgery

## 2018-07-28 ENCOUNTER — Encounter: Payer: Self-pay | Admitting: Vascular Surgery

## 2018-07-28 ENCOUNTER — Ambulatory Visit (HOSPITAL_COMMUNITY)
Admission: RE | Admit: 2018-07-28 | Discharge: 2018-07-28 | Disposition: A | Discharge status: Home / Self Care | Payer: 59 | Source: Ambulatory Visit | Attending: Vascular Surgery | Admitting: Vascular Surgery

## 2018-07-28 VITALS — BP 144/87 | HR 87 | Temp 98.2°F | Resp 20 | Ht 66.0 in | Wt 125.7 lb

## 2018-07-28 DIAGNOSIS — I872 Venous insufficiency (chronic) (peripheral): Secondary | ICD-10-CM | POA: Diagnosis not present

## 2018-07-28 NOTE — Progress Notes (Signed)
[name], female   DOB: June 10, 1951, 68 y.o.   MRN: [id number]  Reason for Consult: New Patient (Initial Visit) (venous insuff)   Referred by Dorothyann Peng, NP  Subjective:     HPI:  Miranda Cochran is a 68 y.o. female with recent history of type 2 diabetes as well as autoimmune hepatitis.  She has a 1 year history of purple discoloration of her feet.  States she is never had a DVT.  Her mother did have significant varicose veins that were treated.  She is never had any treatment herself.  States that this is a worsening problem.  Her feet are actually normal color when she elevates her legs and when she wakes up in the morning.  She has not worn compression stockings.  She has not had any ulceration or tissue loss.  She does have minimal swelling of her left lower extremity but not the right leg.  Past Medical History:  Diagnosis Date  . Allergy   . Anxiety   . Asthma   . Autoimmune hepatitis ([place]) 08/18/2016   08/2016 liver bx confirms, fibrosis not cirrhosis  . Depression   . DM (diabetes mellitus) ([place])   . 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 GERD (gastroesophageal reflux disease)   . Hiatal hernia   . Hx of adenomatous polyp of colon 07/07/2009  . Hyperlipidemia   . Hypertension   . IBS (irritable bowel syndrome)   . Iron deficiency anemia   . 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 Sinusitis, chronic   . Thyroid nodule   . Umbilical hernia   . Uterine fibroid   . Vascular disease   . Vitamin D deficiency    Family History  Problem Relation Age of Onset  . Stroke Mother   . Hypertension Mother   . Colon cancer Father   . Liver cancer Father   . Heart disease Sister        hole in heart  . Stroke Maternal Grandmother   . Hypertension Maternal Grandmother   . Esophageal cancer Maternal Grandfather   . Breast cancer Paternal Grandmother   . Brain cancer Paternal Grandmother   . Lung cancer Paternal  Grandfather   . Diabetes Neg Hx    Past Surgical History:  Procedure Laterality Date  . CATARACT EXTRACTION Bilateral   . COLONOSCOPY  11/1991, 06/2009  . ESOPHAGOGASTRODUODENOSCOPY  02/2002  . ESOPHAGOGASTRODUODENOSCOPY (EGD) WITH PROPOFOL N/A 06/20/2017   Procedure: ESOPHAGOGASTRODUODENOSCOPY (EGD) WITH PROPOFOL;  Surgeon: Gatha Mayer, MD;  Location: [facility];  Service: Endoscopy;  Laterality: N/A;  with Barrx  . EYE SURGERY    . NASAL SEPTOPLASTY W/ TURBINOPLASTY    . TONSILLECTOMY      Short Social History:  Social History   Tobacco Use  . Smoking status: Never Smoker  . Smokeless tobacco: Never Used  Substance Use Topics  . Alcohol use: Yes    Comment: rare    Allergies  Allergen Reactions  . Fentanyl Anxiety    Made her feel very anxious, requests not to get it.  . Levofloxacin Anxiety    Weird dreams   . Prednisone     Anxiety and paranoia  . Amoxicillin Diarrhea    Has patient had a PCN reaction causing immediate rash, facial/tongue/throat swelling, SOB or lightheadedness with hypotension: No Has patient had a PCN reaction causing severe rash involving mucus membranes or skin necrosis: No Has patient  had a PCN reaction that required hospitalization: No Has patient had a PCN reaction occurring within the last 10 years: Yes If all of the above answers are "NO", then may proceed with Cephalosporin use.   . Augmentin [Amoxicillin-Pot Clavulanate] Other (See Comments)    diarrhea  . Wilder Glade [Dapagliflozin] Diarrhea    Gi pain  . Statins     Myalgia     Current Outpatient Medications  Medication Sig Dispense Refill  . ALPRAZolam (XANAX) 0.5 MG tablet Take 1 tablet (0.5 mg total) by mouth at bedtime as needed for anxiety. 30 tablet 0  . aspirin 325 MG tablet Take 650-975 mg by mouth daily as needed for moderate pain or headache.     Marland Kitchen azelastine (OPTIVAR) 0.05 % ophthalmic solution INSTILL 1 DROP IN AFFECTED EYE(S) DAILY 6 mL 0  . blood glucose meter kit and  supplies KIT Dispense based on patient and insurance preference. Use up to four times daily as directed. (FOR ICD-9 250.00, 250.01). 1 each 0  . Blood Glucose Monitoring Suppl (ONE TOUCH ULTRA 2) w/Device KIT Use to check blood sugar 1 each 0  . budesonide (ENTOCORT EC) 3 MG 24 hr capsule TAKE 3 CAPSULES(9 MG) BY MOUTH DAILY 90 capsule 3  . canagliflozin (INVOKANA) 100 MG TABS tablet 1 tablet before breakfast 30 tablet 3  . cyclobenzaprine (FLEXERIL) 10 MG tablet take 1 tablet by mouth at bedtime if needed (Patient taking differently: Take 5-10 mg by mouth daily as needed for muscle spasms. ) 30 tablet 5  . diphenhydrAMINE (BENADRYL) 25 mg capsule Take 25-50 mg by mouth daily as needed for allergies.     . diphenhydrAMINE-zinc acetate (BENADRYL) cream Apply 1 application topically daily as needed for itching.    . EFFEXOR XR 75 MG 24 hr capsule TAKE ONE CAPSULE BY MOUTH TWICE DAILY WITH A MEAL 60 capsule 5  . ferrous sulfate 325 (65 FE) MG tablet Take 325 mg by mouth 2 (two) times daily with a meal.    . Fluticasone-Salmeterol (ADVAIR DISKUS) 100-50 MCG/DOSE AEPB Inhale 1 puff into the lungs 2 (two) times daily. (Patient taking differently: Inhale 1 puff into the lungs daily as needed (shortness of breath). ) 60 each 11  . furosemide (LASIX) 20 MG tablet Take 1 tablet (20 mg total) by mouth daily as needed. 30 tablet 5  . glucose blood test strip Use as instructed to test once daily. 100 each 12  . Lancets (ONETOUCH DELICA PLUS [id number]) MISC USE UP TO FOUR TIMES DAILY AS DIRECTED 100 each 0  . lansoprazole (PREVACID) 30 MG capsule TAKE 1 CAPSULE(30 MG) BY MOUTH DAILY 90 capsule 1  . Liver Extract (LIVER PO) Take 1 capsule by mouth 2 (two) times daily.    . metFORMIN (GLUCOPHAGE) 500 MG tablet Take 1 tablet (500 mg total) by mouth 3 (three) times daily. TAKE 1 TABLET BY MOUTH 3 TIMES DAILY. 90 tablet 3  . montelukast (SINGULAIR) 10 MG tablet TAKE 1 TABLET BY MOUTH AT BEDTIME. 90 tablet 0  .  Multiple Vitamins-Minerals (HAIR SKIN AND NAILS FORMULA) TABS Take 1 tablet by mouth daily.    . phenylephrine (SUDAFED PE) 10 MG TABS tablet Take 10 mg by mouth every 4 (four) hours as needed.    . promethazine (PHENERGAN) 12.5 MG tablet Take 1 tablet (12.5 mg total) by mouth every 8 (eight) hours as needed for nausea or vomiting. 20 tablet 1  . pseudoephedrine (SUDAFED) 30 MG tablet Take 30 mg by mouth  daily as needed for congestion.     . sodium chloride (BRONCHO SALINE) inhaler solution Take 1 spray by nebulization as needed.    . TRULICITY 1.5 GB/1.5VV SOPN INJECT 1 PEN ONCE WEEKLY AS DIRECTED 2 mL 0  . Ertugliflozin L-PyroglutamicAc (STEGLATRO) 5 MG TABS Take 5 mg by mouth daily with breakfast. (Patient not taking: Reported on 07/28/2018) 30 tablet 3  . oxyCODONE-acetaminophen (PERCOCET/ROXICET) 5-325 MG tablet Take 0.5 tablets by mouth every 8 (eight) hours as needed for severe pain. (Patient not taking: Reported on 07/28/2018) 30 tablet 0   No current facility-administered medications for this visit.     Review of Systems  Constitutional:  Constitutional negative. HENT: HENT negative.  Eyes: Eyes negative.  Respiratory: Respiratory negative.  Cardiovascular: Positive for leg swelling.  GI: Gastrointestinal negative.  Musculoskeletal: Musculoskeletal negative.  Skin:       Purple discoloration of the feet Neurological: Neurological negative. Hematologic: Hematologic/lymphatic negative.  Psychiatric: Psychiatric negative.        Objective:  Objective   Vitals:   07/28/18 0955  BP: (!) 144/87  Pulse: 87  Resp: 20  Temp: 98.2 F (36.8 C)  SpO2: 98%  Weight: 125 lb 10.6 oz (57 kg)  Height: '5\' 6"'$  (1.676 m)   Body mass index is 20.28 kg/m.  Physical Exam HENT:     Head: Normocephalic.     Nose: Nose normal.  Eyes:     Pupils: Pupils are equal, round, and reactive to light.  Neck:     Musculoskeletal: Normal range of motion and neck supple.  Cardiovascular:      Rate and Rhythm: Normal rate.     Pulses:          Carotid pulses are 2+ on the right side and 2+ on the left side.      Radial pulses are 2+ on the right side and 2+ on the left side.       Popliteal pulses are 2+ on the right side and 2+ on the left side.       Dorsalis pedis pulses are 2+ on the right side and 2+ on the left side.  Pulmonary:     Effort: Pulmonary effort is normal.  Abdominal:     General: Abdomen is flat.     Palpations: Abdomen is soft.  Musculoskeletal: Normal range of motion.     Comments: Multiple telangiectasias throughout left thigh bilateral legs  Skin:    General: Skin is warm and dry.     Capillary Refill: Capillary refill takes less than 2 seconds.     Comments: Her feet are both purple discolored does blanch with pressure.  Neurological:     General: No focal deficit present.     Mental Status: She is alert.  Psychiatric:        Mood and Affect: Mood normal.        Behavior: Behavior normal.        Thought Content: Thought content normal.        Judgment: Judgment normal.     Data: I have independently interpreted her venous reflux study which demonstrates reflux in her bilateral greater saphenous veins with greater saphenous vein measuring 0.74 cm of the junction and 0.54 in the proximal thigh on the right and on the left side 0.72 cm at the junction 0.4 cm in the mid thigh     Assessment/Plan:     68 year old female presents for left lower extremity swelling with associated telangiectasias and some  varicosities on the left side consistent with C2 venous disease.  She has impressive purple discoloration of her feet that does improve rapidly with elevation of her legs and does blanch.  She also has reflux in her bilateral greater saphenous veins.  Given how impressive this is I believe this to be multifactorial but certainly some component from her venous reflux.  We will get her in thigh-high compression stockings and f/u in 3 months for further  evaluation.      Waynetta Sandy MD [facility]

## 2018-08-03 ENCOUNTER — Ambulatory Visit: Payer: 59 | Admitting: Internal Medicine

## 2018-08-10 ENCOUNTER — Ambulatory Visit: Payer: 59 | Admitting: Internal Medicine

## 2018-08-10 ENCOUNTER — Encounter: Payer: Self-pay | Admitting: Internal Medicine

## 2018-08-10 VITALS — BP 140/74 | HR 78 | Ht 66.0 in | Wt 125.4 lb

## 2018-08-10 DIAGNOSIS — K31819 Angiodysplasia of stomach and duodenum without bleeding: Secondary | ICD-10-CM | POA: Diagnosis not present

## 2018-08-10 DIAGNOSIS — K754 Autoimmune hepatitis: Secondary | ICD-10-CM

## 2018-08-10 DIAGNOSIS — D500 Iron deficiency anemia secondary to blood loss (chronic): Secondary | ICD-10-CM

## 2018-08-10 NOTE — Assessment & Plan Note (Addendum)
CBC, ferritin April Stay on Fe SO4

## 2018-08-10 NOTE — Assessment & Plan Note (Addendum)
?   Still a problem given decline in ferritin We discussed and will hold off on repeat EGD

## 2018-08-10 NOTE — Assessment & Plan Note (Addendum)
Recheck LFT April Given only slight bump in transaminases and her hx of doing this will hold at 6 mg budesonide qd I think having a repeat liver biopsy this year makes sense and introduced that idea to her We once again discussed why azathioprine is preferred agent - steroid-sparing - she is still not ready to try that rx F/U TBA pending lab review

## 2018-08-10 NOTE — Patient Instructions (Addendum)
  Please come the week of April 6th and have blood work drawn. No appointment needed. The lab is open 7:30 AM- 5:30 PM, and they don't close for lunch. However they are closed 09/22/2018 for Good Friday.   I appreciate the opportunity to care for you. Silvano Rusk, MD, [facility]

## 2018-08-10 NOTE — Progress Notes (Signed)
Miranda Cochran 68 y.o. Dec 07, 1950 [id number]  Assessment & Plan:   Autoimmune hepatitis treated with steroids ([place]) Recheck LFT April Given only slight bump in transaminases and her hx of doing this will hold at 6 mg budesonide qd I think having a repeat liver biopsy this year makes sense and introduced that idea to her We once again discussed why azathioprine is preferred agent - steroid-sparing - she is still not ready to try that rx F/U TBA pending lab review  GAVE (gastric antral vascular ectasia) ? 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 Increased urination, fatigue when taken in daytime and when taking at night she is anxious and cannot sleep. Has been to Dr. Donzetta Matters for blotchy legs - has C2 vascular disease and not really anything to be done. Bruises easily and I explained budesonide can contribute to that and also hyperglycemia. She is still reluctant/against using azathioprine.  Does not notice any blood in stools. Her ferritin has declined to 13 in January and transaminases were also up slightly. She initially resisted this appointment because she thinks/thought I am going to try to make her change medications. We discussed again the reational for using azathiooprine instead of steroids to treat her autoimmune hepatitis.   Allergies  Allergen Reactions  . Fentanyl Anxiety    Made her feel very anxious, requests not to get it.  . Levofloxacin Anxiety    Weird dreams   . Prednisone     Anxiety and paranoia  . Amoxicillin Diarrhea    Has patient had a PCN reaction causing immediate rash, facial/tongue/throat swelling,  SOB or lightheadedness with hypotension: No Has patient had a PCN reaction causing severe rash involving mucus membranes or skin necrosis: No Has patient had a PCN reaction that required hospitalization: No Has patient had a PCN reaction occurring within the last 10 years: Yes If all of the above answers are "NO", then may proceed with Cephalosporin use.   . Augmentin [Amoxicillin-Pot Clavulanate] Other (See Comments)    diarrhea  . Wilder Glade [Dapagliflozin] Diarrhea    Gi pain  . Statins     Myalgia    Current Meds  Medication Sig  . ALPRAZolam (XANAX) 0.5 MG tablet Take 1 tablet (0.5 mg total) by mouth at bedtime as needed for anxiety.  Marland Kitchen aspirin 325 MG tablet Take 650-975 mg by mouth daily as needed for moderate pain or headache.   Marland Kitchen azelastine (OPTIVAR) 0.05 % ophthalmic solution INSTILL 1 DROP IN AFFECTED EYE(S) DAILY  . blood glucose meter kit and supplies KIT Dispense based on patient and insurance preference. Use up to four times daily as directed. (FOR ICD-9 250.00, 250.01).  . Blood Glucose Monitoring Suppl (ONE TOUCH ULTRA 2) w/Device KIT Use to check blood sugar  . budesonide (ENTOCORT EC) 3 MG 24 hr capsule Take 6 mg by mouth daily.  . canagliflozin (INVOKANA) 100 MG TABS tablet 1 tablet before breakfast  . cyclobenzaprine (FLEXERIL) 10 MG tablet take 1 tablet by mouth at bedtime if needed (Patient taking differently: Take 5-10 mg by mouth daily as needed for muscle spasms. )  . diphenhydrAMINE (BENADRYL) 25 mg capsule  Take 25-50 mg by mouth daily as needed for allergies.   . diphenhydrAMINE-zinc acetate (BENADRYL) cream Apply 1 application topically daily as needed for itching.  . EFFEXOR XR 75 MG 24 hr capsule TAKE ONE CAPSULE BY MOUTH TWICE DAILY WITH A MEAL  . Ertugliflozin L-PyroglutamicAc (STEGLATRO) 5 MG TABS Take 5 mg by mouth daily with breakfast.  . ferrous sulfate 325 (65 FE) MG tablet Take 325 mg by mouth 2 (two) times daily with a meal.  . Fluticasone-Salmeterol  (ADVAIR DISKUS) 100-50 MCG/DOSE AEPB Inhale 1 puff into the lungs 2 (two) times daily. (Patient taking differently: Inhale 1 puff into the lungs daily as needed (shortness of breath). )  . furosemide (LASIX) 20 MG tablet Take 1 tablet (20 mg total) by mouth daily as needed.  Marland Kitchen glucose blood test strip Use as instructed to test once daily.  . Lancets (ONETOUCH DELICA PLUS [id number]) MISC USE UP TO FOUR TIMES DAILY AS DIRECTED  . lansoprazole (PREVACID) 30 MG capsule TAKE 1 CAPSULE(30 MG) BY MOUTH DAILY  . Liver Extract (LIVER PO) Take 1 capsule by mouth 2 (two) times daily.  . metFORMIN (GLUCOPHAGE) 500 MG tablet Take 1 tablet (500 mg total) by mouth 3 (three) times daily. TAKE 1 TABLET BY MOUTH 3 TIMES DAILY.  . montelukast (SINGULAIR) 10 MG tablet TAKE 1 TABLET BY MOUTH AT BEDTIME.  . Multiple Vitamins-Minerals (HAIR SKIN AND NAILS FORMULA) TABS Take 1 tablet by mouth daily.  Marland Kitchen oxyCODONE-acetaminophen (PERCOCET/ROXICET) 5-325 MG tablet Take 0.5 tablets by mouth every 8 (eight) hours as needed for severe pain.  . phenylephrine (SUDAFED PE) 10 MG TABS tablet Take 10 mg by mouth every 4 (four) hours as needed.  . promethazine (PHENERGAN) 12.5 MG tablet Take 1 tablet (12.5 mg total) by mouth every 8 (eight) hours as needed for nausea or vomiting.  . pseudoephedrine (SUDAFED) 30 MG tablet Take 30 mg by mouth daily as needed for congestion.   . sodium chloride (BRONCHO SALINE) inhaler solution Take 1 spray by nebulization as needed.  . TRULICITY 1.5 ER/7.4YC SOPN INJECT 1 PEN ONCE WEEKLY AS DIRECTED   Past Medical History:  Diagnosis Date  . Allergy   . Anxiety   . Asthma   . Autoimmune hepatitis ([place]) 08/18/2016   08/2016 liver bx confirms, fibrosis not cirrhosis  . Depression   . DM (diabetes mellitus) ([place])   . 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 GERD (gastroesophageal reflux disease)   . Hiatal hernia   . Hx of adenomatous polyp of colon 07/07/2009  . Hyperlipidemia   .  Hypertension   . IBS (irritable bowel syndrome)   . Iron deficiency anemia   . 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 Sinusitis, chronic   . Thyroid nodule   . Umbilical hernia   . Uterine fibroid   . Vascular disease   . Vitamin D deficiency    Past Surgical History:  Procedure Laterality Date  . CATARACT EXTRACTION Bilateral   . COLONOSCOPY  11/1991, 06/2009  . ESOPHAGOGASTRODUODENOSCOPY  02/2002  . ESOPHAGOGASTRODUODENOSCOPY (EGD) WITH PROPOFOL N/A 06/20/2017   Procedure: ESOPHAGOGASTRODUODENOSCOPY (EGD) WITH PROPOFOL;  Surgeon: Gatha Mayer, MD;  Location: [facility];  Service: Endoscopy;  Laterality: N/A;  with Barrx  . EYE SURGERY    . NASAL SEPTOPLASTY W/ TURBINOPLASTY    . 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   Review of Systems As per HPI Also having Sinus headache  Objective:   Physical Exam BP 140/74   Pulse 78   Ht 5' 6" (1.676 m)   Wt 125 lb 6 oz (56.9 kg)   BMI 20.24 kg/m  Anicteric eyes Left upper lip slightly edematous "from stress" - over this appointment Tanned, some ecchymoses seen on hands abd soft nontender and no HSM/mass Pleasant affect

## 2018-08-16 ENCOUNTER — Other Ambulatory Visit: Payer: Self-pay | Admitting: Endocrinology

## 2018-08-21 ENCOUNTER — Other Ambulatory Visit: Payer: Self-pay

## 2018-08-21 MED ORDER — ONETOUCH ULTRA 2 W/DEVICE KIT: PACK | 3 refills | Status: DC

## 2018-08-28 ENCOUNTER — Other Ambulatory Visit: Payer: Self-pay

## 2018-08-28 MED ORDER — GLUCOSE BLOOD VI STRP: ORAL_STRIP | 3 refills | Status: DC

## 2018-09-13 ENCOUNTER — Other Ambulatory Visit (INDEPENDENT_AMBULATORY_CARE_PROVIDER_SITE_OTHER): Payer: Self-pay

## 2018-09-13 ENCOUNTER — Other Ambulatory Visit: Payer: Self-pay

## 2018-09-13 DIAGNOSIS — E1165 Type 2 diabetes mellitus with hyperglycemia: Secondary | ICD-10-CM

## 2018-09-13 LAB — BASIC METABOLIC PANEL
BUN: 18 mg/dL (ref 6–23)
CO2: 25 mEq/L (ref 19–32)
Calcium: 9.1 mg/dL (ref 8.4–10.5)
Chloride: 100 mEq/L (ref 96–112)
Creatinine, Ser: 0.55 mg/dL (ref 0.40–1.20)
GFR: 110.45 mL/min (ref >60.00)
Glucose, Bld: 141 mg/dL — ABNORMAL HIGH (ref 70–99)
Potassium: 3.9 mEq/L (ref 3.5–5.1)
Sodium: 136 mEq/L (ref 135–145)

## 2018-09-14 ENCOUNTER — Other Ambulatory Visit: Payer: Self-pay | Admitting: Internal Medicine

## 2018-09-14 LAB — FRUCTOSAMINE: Fructosamine: 271 umol/L (ref 0–285)

## 2018-09-15 ENCOUNTER — Ambulatory Visit: Payer: 59 | Admitting: Endocrinology

## 2018-09-15 ENCOUNTER — Other Ambulatory Visit: Payer: Self-pay

## 2018-09-15 ENCOUNTER — Encounter: Payer: Self-pay | Admitting: Endocrinology

## 2018-09-15 VITALS — BP 130/78 | HR 83 | Ht 66.0 in | Wt 122.6 lb

## 2018-09-15 DIAGNOSIS — E782 Mixed hyperlipidemia: Secondary | ICD-10-CM

## 2018-09-15 DIAGNOSIS — E1165 Type 2 diabetes mellitus with hyperglycemia: Secondary | ICD-10-CM

## 2018-09-15 MED ORDER — EZETIMIBE 10 MG PO TABS: 10.0000 mg | ORAL_TABLET | Freq: Every day | ORAL | 3 refills | Status: DC

## 2018-09-15 NOTE — Progress Notes (Signed)
[name], female   DOB: [birth year], 68 y.o.   MRN: [id number]          Reason for Appointment: Follow-up for Type 2 Diabetes    History of Present Illness:          Date of diagnosis of type 2 diabetes mellitus: ?  2013        Background history:   She had a blood sugar over 200 in 2013 but was not told to have diabetes until 04/2015 A1c was 7.3 in 3/16 In 04/2015 blood sugar was 478 says her blood sugar was 600 when she was diagnosed and was not having any significant symptoms Initially treated with Metaglip but this apparently caused abdominal discomfort and she was switched to metformin In 2017 she had been tried on Bydureon for better control but she stopped this after a few weeks because of large persisted nodules in her skin She thinks her blood sugars were better with this and A1c was down to 6.8  Recent history:   Non-insulin hypoglycemic drugs the patient is taking are: Trulicity 1.5 mg weekly, INVOKANA 100 mg at bedtime and metformin 1500 mg   Her A1c in 1/20 was 5.5 Fructosamine is 271 compared to 282 previously  Current management, blood sugar patterns and problems identified:  She was told to try Steglatro instead of Invokana because she felt it was causing her anorexia and some nausea  However she did not do so and with changing the Invokana to bedtime she thinks she has restored her appetite  Previously had lost 5 pounds and appears to have lost another 3 pounds  She said that she is trying to eat her meals and also some protein shake in the morning  However she is not willing to see the dietitian for meal planning  No nausea from Trulicity also  However she is checking blood sugars mostly in the mornings and these are still modestly increased  She may be having high readings after meals especially with eating carbohydrates like sandwiches        Side effects from medications have been: Metaglip caused abdominal pain, Tradjenta caused  swelling of the legs, Bydureon caused skin nodules, Farxiga caused diarrhea, glipizide causes dizziness and weakness  Compliance with the medical regimen: Inconsistent  Glucose monitoring:  done less than 1 times a day         Glucometer: One Touch ultra mini.      Blood Glucose readings by time of day by monitor download:   PRE-MEAL  morning Lunch Dinner Bedtime Overall  Glucose range:  132-209  153-172     Mean/median:  160  170    164   POST-MEAL PC Breakfast PC Lunch PC Dinner  Glucose range:  ? ?  Mean/median:          Self-care:  Typical meal intake: Breakfast is Oatmeal, usually eating low-fat dinner and her snacks will be fruit, peanut butter crackers                Dietician visit, most recent: None               Exercise: walking   Weight history:  Wt Readings from Last 3 Encounters:  09/15/18 122 lb 9.6 oz (55.6 kg)  08/10/18 125 lb 6 oz (56.9 kg)  07/28/18 125 lb 10.6 oz (57 kg)    Glycemic control:   Lab Results  Component Value Date   HGBA1C 5.5 07/04/2018  HGBA1C 6.2 02/17/2018   HGBA1C 5.9 07/08/2017   Lab Results  Component Value Date   MICROALBUR 8.2 (H) 07/12/2017   LDLCALC 170 (H) 05/08/2018   CREATININE 0.55 09/13/2018   Lab Results  Component Value Date   MICRALBCREAT 14.7 07/12/2017    Lab Results  Component Value Date   FRUCTOSAMINE 271 09/13/2018   FRUCTOSAMINE 282 05/08/2018   FRUCTOSAMINE 269 10/07/2017    Lab on 09/13/2018  Component Date Value Ref Range Status  . Fructosamine 09/13/2018 271  0 - 285 umol/L Final   Comment: Published reference interval for apparently healthy subjects between age 70 and 23 is 84 - 285 umol/L and in a poorly controlled diabetic population is 228 - 563 umol/L with a mean of 396 umol/L.   Marland Kitchen Sodium 09/13/2018 136  135 - 145 mEq/L Final  . Potassium 09/13/2018 3.9  3.5 - 5.1 mEq/L Final  . Chloride 09/13/2018 100  96 - 112 mEq/L Final  . CO2 09/13/2018 25  19 - 32 mEq/L Final  . Glucose,  Bld 09/13/2018 141* 70 - 99 mg/dL Final  . BUN 09/13/2018 18  6 - 23 mg/dL Final  . Creatinine, Ser 09/13/2018 0.55  0.40 - 1.20 mg/dL Final  . Calcium 09/13/2018 9.1  8.4 - 10.5 mg/dL Final  . GFR 09/13/2018 110.45  >60.00 mL/min Final     Allergies as of 09/15/2018      Reactions   Fentanyl Anxiety   Made her feel very anxious, requests not to get it.   Levofloxacin Anxiety   Weird dreams    Prednisone    Anxiety and paranoia   Amoxicillin Diarrhea   Has patient had a PCN reaction causing immediate rash, facial/tongue/throat swelling, SOB or lightheadedness with hypotension: No Has patient had a PCN reaction causing severe rash involving mucus membranes or skin necrosis: No Has patient had a PCN reaction that required hospitalization: No Has patient had a PCN reaction occurring within the last 10 years: Yes If all of the above answers are "NO", then may proceed with Cephalosporin use.   Augmentin [amoxicillin-pot Clavulanate] Other (See Comments)   diarrhea   Farxiga [dapagliflozin] Diarrhea   Gi pain   Statins    Myalgia       Medication List       Accurate as of September 15, 2018  9:12 AM. Always use your most recent med list.        ALPRAZolam 0.5 MG tablet Commonly known as:  XANAX Take 1 tablet (0.5 mg total) by mouth at bedtime as needed for anxiety.   aspirin 325 MG tablet Take 650-975 mg by mouth daily as needed for moderate pain or headache.   azelastine 0.05 % ophthalmic solution Commonly known as:  OPTIVAR INSTILL 1 DROP IN AFFECTED EYE(S) DAILY   blood glucose meter kit and supplies Kit Dispense based on patient and insurance preference. Use up to four times daily as directed. (FOR ICD-9 250.00, 250.01).   budesonide 3 MG 24 hr capsule Commonly known as:  ENTOCORT EC TAKE 3 CAPSULES(9 MG) BY MOUTH DAILY   canagliflozin 100 MG Tabs tablet Commonly known as:  Invokana 1 tablet before breakfast   cyclobenzaprine 10 MG tablet Commonly known as:   FLEXERIL take 1 tablet by mouth at bedtime if needed   diphenhydrAMINE 25 mg capsule Commonly known as:  BENADRYL Take 25-50 mg by mouth daily as needed for allergies.   diphenhydrAMINE-zinc acetate cream Commonly known as:  BENADRYL Apply 1  application topically daily as needed for itching.   Effexor XR 75 MG 24 hr capsule Generic drug:  venlafaxine XR TAKE ONE CAPSULE BY MOUTH TWICE DAILY WITH A MEAL   Ertugliflozin L-PyroglutamicAc 5 MG Tabs Commonly known as:  Steglatro Take 5 mg by mouth daily with breakfast.   ferrous sulfate 325 (65 FE) MG tablet Take 325 mg by mouth 2 (two) times daily with a meal.   Fluticasone-Salmeterol 100-50 MCG/DOSE Aepb Commonly known as:  Advair Diskus Inhale 1 puff into the lungs 2 (two) times daily.   furosemide 20 MG tablet Commonly known as:  LASIX Take 1 tablet (20 mg total) by mouth daily as needed.   glucose blood test strip Use OneTouch Ultra Blue test strips as instructed to check blood sugar once daily.   Hair Skin and Nails Formula Tabs Take 1 tablet by mouth daily.   lansoprazole 30 MG capsule Commonly known as:  PREVACID TAKE 1 CAPSULE(30 MG) BY MOUTH DAILY   LIVER PO Take 1 capsule by mouth 2 (two) times daily.   metFORMIN 500 MG tablet Commonly known as:  GLUCOPHAGE Take 1 tablet (500 mg total) by mouth 3 (three) times daily. TAKE 1 TABLET BY MOUTH 3 TIMES DAILY.   montelukast 10 MG tablet Commonly known as:  SINGULAIR TAKE 1 TABLET BY MOUTH AT BEDTIME.   ONE TOUCH ULTRA 2 w/Device Kit Use as instructed to check blood sugar once daily.   OneTouch Delica Plus [id number] Misc USE UP TO FOUR TIMES DAILY AS DIRECTED   oxyCODONE-acetaminophen 5-325 MG tablet Commonly known as:  PERCOCET/ROXICET Take 0.5 tablets by mouth every 8 (eight) hours as needed for severe pain.   phenylephrine 10 MG Tabs tablet Commonly known as:  SUDAFED PE Take 10 mg by mouth every 4 (four) hours as needed.   promethazine 12.5 MG  tablet Commonly known as:  PHENERGAN Take 1 tablet (12.5 mg total) by mouth every 8 (eight) hours as needed for nausea or vomiting.   pseudoephedrine 30 MG tablet Commonly known as:  SUDAFED Take 30 mg by mouth daily as needed for congestion.   sodium chloride inhaler solution Commonly known as:  BRONCHO SALINE Take 1 spray by nebulization as needed.   Trulicity 1.5 KD/3.2IZ Sopn Generic drug:  Dulaglutide INJECT 1 PEN AS DIRECTED ONCE WEEKLY AS DIRECTED       Allergies:  Allergies  Allergen Reactions  . Fentanyl Anxiety    Made her feel very anxious, requests not to get it.  . Levofloxacin Anxiety    Weird dreams   . Prednisone     Anxiety and paranoia  . Amoxicillin Diarrhea    Has patient had a PCN reaction causing immediate rash, facial/tongue/throat swelling, SOB or lightheadedness with hypotension: No Has patient had a PCN reaction causing severe rash involving mucus membranes or skin necrosis: No Has patient had a PCN reaction that required hospitalization: No Has patient had a PCN reaction occurring within the last 10 years: Yes If all of the above answers are "NO", then may proceed with Cephalosporin use.   . Augmentin [Amoxicillin-Pot Clavulanate] Other (See Comments)    diarrhea  . Wilder Glade [Dapagliflozin] Diarrhea    Gi pain  . Statins     Myalgia     Past Medical History:  Diagnosis Date  . Allergy   . Anxiety   . Asthma   . Autoimmune hepatitis ([place]) 08/18/2016   08/2016 liver bx confirms, fibrosis not cirrhosis  . Depression   .  DM (diabetes mellitus) ([place])   . 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 GERD (gastroesophageal reflux disease)   . Hiatal hernia   . Hx of adenomatous polyp of colon 07/07/2009  . Hyperlipidemia   . Hypertension   . IBS (irritable bowel syndrome)   . Iron deficiency anemia   . 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 Sinusitis, chronic   . Thyroid nodule   . Umbilical hernia    . Uterine fibroid   . Vascular disease   . Vitamin D deficiency     Past Surgical History:  Procedure Laterality Date  . CATARACT EXTRACTION Bilateral   . COLONOSCOPY  11/1991, 06/2009  . ESOPHAGOGASTRODUODENOSCOPY  02/2002  . ESOPHAGOGASTRODUODENOSCOPY (EGD) WITH PROPOFOL N/A 06/20/2017   Procedure: ESOPHAGOGASTRODUODENOSCOPY (EGD) WITH PROPOFOL;  Surgeon: Gatha Mayer, MD;  Location: [facility];  Service: Endoscopy;  Laterality: N/A;  with Barrx  . EYE SURGERY    . NASAL SEPTOPLASTY W/ TURBINOPLASTY    . TONSILLECTOMY      Family History  Problem Relation Age of Onset  . Stroke Mother   . Hypertension Mother   . Colon cancer Father   . Liver cancer Father   . Heart disease Sister        hole in heart  . Stroke Maternal Grandmother   . Hypertension Maternal Grandmother   . Esophageal cancer Maternal Grandfather   . Breast cancer Paternal Grandmother   . Brain cancer Paternal Grandmother   . Lung cancer Paternal Grandfather   . Diabetes Neg Hx     Social History:  reports that she has never smoked. She has never used smokeless tobacco. She reports current alcohol use. She reports that she does not use drugs.   Review of Systems   Lipid history: Previously had tried Zocor which apparently caused muscle aches, she took Crestor and she thinks generic causes muscle cramps She took Livalo for a couple of days but she thinks she didn't feel well with this and had some palpitations reportedly Has not discussed treatment with her PCP Currently on no treatment     Lab Results  Component Value Date   CHOL 248 (H) 05/08/2018   HDL 52.50 05/08/2018   LDLCALC 170 (H) 05/08/2018   TRIG 128.0 05/08/2018   CHOLHDL 5 05/08/2018           Lab Results  Component Value Date   ALT 34 07/04/2018    Hypertension: None, she is taking Lasix as needed because of tendency to swelling in her abdomen   BP Readings from Last 3 Encounters:  09/15/18 130/78  08/10/18 140/74   07/28/18 (!) 144/87     Most recent eye exam was 9/19  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 Fructosamine 09/13/2018 271  0 - 285 umol/L Final   Comment: Published reference interval for apparently healthy subjects between age 89 and 20 is 82 - 285 umol/L and in a poorly controlled diabetic population is 228 - 563 umol/L with a mean of 396 umol/L.   Marland Kitchen Sodium 09/13/2018 136  135 - 145 mEq/L Final  . Potassium 09/13/2018 3.9  3.5 - 5.1 mEq/L Final  . Chloride 09/13/2018 100  96 - 112 mEq/L Final  . CO2 09/13/2018 25  19 - 32 mEq/L Final  . Glucose, Bld 09/13/2018 141*  70 - 99 mg/dL Final  . BUN 09/13/2018 18  6 - 23 mg/dL Final  . Creatinine, Ser 09/13/2018 0.55  0.40 - 1.20 mg/dL Final  . Calcium 09/13/2018 9.1  8.4 - 10.5 mg/dL Final  . GFR 09/13/2018 110.45  >60.00 mL/min Final    Physical Examination:  BP 130/78 (BP Location: Left Arm, Patient Position: Sitting, Cuff Size: Normal)   Pulse 83   Ht '5\' 6"'$  (1.676 m)   Wt 122 lb 9.6 oz (55.6 kg)   SpO2 99%   BMI 19.79 kg/m          ASSESSMENT:  Diabetes type 2, nonobese  See history of present illness for detailed discussion of current diabetes management, blood sugar patterns and problems identified  A1c has been usually lower than expected, last 5.5 Fructosamine is modestly increased at 271  She is now able to tolerate Invokana and she thinks this still works without side effects like nausea and anorexia which she reported initially Blood sugars are generally better although she is checking mostly fasting readings She is otherwise doing well with this and renal function is stable  However as before she is tending to lose weight with current medications    PLAN:     She will continue 100 mg of Invokana in addition to Trulicity and metformin Although she may benefit from a higher dose this may cause  increased weight loss She will consider talking to the dietitian In the meantime encouraged her to have more snacks in between meals to increase her caloric intake but avoid high carbohydrate intake Encourage her to be more active and walk for exercise  Fructosamine will be more useful than A1c to monitor her progress and will also be done on the next visit  Lipids: Untreated and is statin intolerant, discussed need for bringing her LDL down which is above target and she agrees to try Zetia 10 mg daily Repeat lipids on the next visit   There are no Patient Instructions on file for this visit.      Elayne Snare 09/15/2018, 9:12 AM   Note: This office note was prepared with Dragon voice recognition system technology. Any transcriptional errors that result from this process are unintentional.

## 2018-09-15 NOTE — Patient Instructions (Signed)
Check blood sugars on waking up 2 days a week  Also check blood sugars about 2 hours after meals and do this after different meals by rotation  Recommended blood sugar levels on waking up are 90-130 and about 2 hours after meal is 130-160  Please bring your blood sugar monitor to each visit, thank you   

## 2018-09-18 ENCOUNTER — Other Ambulatory Visit: Payer: Self-pay | Admitting: Endocrinology

## 2018-09-28 ENCOUNTER — Other Ambulatory Visit: Payer: Self-pay | Admitting: Endocrinology

## 2018-10-15 ENCOUNTER — Other Ambulatory Visit: Payer: Self-pay | Admitting: Internal Medicine

## 2018-10-15 ENCOUNTER — Other Ambulatory Visit: Payer: Self-pay | Admitting: Endocrinology

## 2018-10-15 DIAGNOSIS — R52 Pain, unspecified: Secondary | ICD-10-CM

## 2018-10-16 NOTE — Telephone Encounter (Signed)
Refill x6

## 2018-10-16 NOTE — Telephone Encounter (Signed)
May we refill Sir? 

## 2018-10-19 ENCOUNTER — Other Ambulatory Visit: Payer: Self-pay | Admitting: Family Medicine

## 2018-10-19 DIAGNOSIS — J40 Bronchitis, not specified as acute or chronic: Secondary | ICD-10-CM

## 2018-10-26 ENCOUNTER — Ambulatory Visit: Payer: 59 | Admitting: Vascular Surgery

## 2018-10-29 ENCOUNTER — Other Ambulatory Visit: Payer: Self-pay | Admitting: Endocrinology

## 2018-10-29 DIAGNOSIS — IMO0001 Reserved for inherently not codable concepts without codable children: Secondary | ICD-10-CM

## 2018-12-02 ENCOUNTER — Other Ambulatory Visit: Payer: Self-pay | Admitting: Endocrinology

## 2018-12-02 DIAGNOSIS — IMO0001 Reserved for inherently not codable concepts without codable children: Secondary | ICD-10-CM

## 2018-12-07 ENCOUNTER — Ambulatory Visit: Payer: Self-pay | Admitting: *Deleted

## 2018-12-07 NOTE — Telephone Encounter (Signed)
Patient called, left VM to return the call to the office. 

## 2018-12-07 NOTE — Telephone Encounter (Signed)
Patient called, left VM to return call to the office.   This is the 3rd attempt by [facility] NT, will route to the office for resolution.

## 2018-12-07 NOTE — Telephone Encounter (Signed)
    1    Summary: foot swollen, red, warm   Patient called in stating her foot is 3x the size it was. It is painful, swelling, warm, some redness. States it is possible something bit her but she is unaware. Please advise. Attempted to triage however patient disconnected before getting a nurse on the line.         Call History   Type Contact Phone User  12/07/2018 02:05 PM EDT Phone ([address] Aanya, [place] (Self) [phone] Evangeline Dakin, [place]

## 2018-12-08 NOTE — Telephone Encounter (Signed)
Called and spoke with pt and she stated that she did have her foot looked at yesterday.

## 2018-12-20 ENCOUNTER — Other Ambulatory Visit (INDEPENDENT_AMBULATORY_CARE_PROVIDER_SITE_OTHER): Payer: 59

## 2018-12-20 ENCOUNTER — Other Ambulatory Visit: Payer: Self-pay

## 2018-12-20 DIAGNOSIS — E782 Mixed hyperlipidemia: Secondary | ICD-10-CM

## 2018-12-20 DIAGNOSIS — E1165 Type 2 diabetes mellitus with hyperglycemia: Secondary | ICD-10-CM | POA: Diagnosis not present

## 2018-12-20 LAB — COMPREHENSIVE METABOLIC PANEL
ALT: 51 U/L — ABNORMAL HIGH (ref 0–35)
AST: 50 U/L — ABNORMAL HIGH (ref 0–37)
Albumin: 3.9 g/dL (ref 3.5–5.2)
Alkaline Phosphatase: 82 U/L (ref 39–117)
BUN: 11 mg/dL (ref 6–23)
CO2: 27 mEq/L (ref 19–32)
Calcium: 9 mg/dL (ref 8.4–10.5)
Chloride: 101 mEq/L (ref 96–112)
Creatinine, Ser: 0.48 mg/dL (ref 0.40–1.20)
GFR: 129.14 mL/min (ref >60.00)
Glucose, Bld: 102 mg/dL — ABNORMAL HIGH (ref 70–99)
Potassium: 3.9 mEq/L (ref 3.5–5.1)
Sodium: 137 mEq/L (ref 135–145)
Total Bilirubin: 0.8 mg/dL (ref 0.2–1.2)
Total Protein: 7.1 g/dL (ref 6.0–8.3)

## 2018-12-20 LAB — LIPID PANEL
Cholesterol: 219 mg/dL — ABNORMAL HIGH (ref 0–200)
HDL: 56.3 mg/dL (ref >39.00)
LDL Cholesterol: 143 mg/dL — ABNORMAL HIGH (ref 0–99)
NonHDL: 163.15
Total CHOL/HDL Ratio: 4
Triglycerides: 101 mg/dL (ref 0.0–149.0)
VLDL: 20.2 mg/dL (ref 0.0–40.0)

## 2018-12-20 LAB — MICROALBUMIN / CREATININE URINE RATIO
Creatinine,U: 101.4 mg/dL
Microalb Creat Ratio: 21.3 mg/g (ref 0.0–30.0)
Microalb, Ur: 21.6 mg/dL — ABNORMAL HIGH (ref 0.0–1.9)

## 2018-12-20 LAB — HEMOGLOBIN A1C: Hgb A1c MFr Bld: 5.3 % (ref 4.6–6.5)

## 2018-12-22 ENCOUNTER — Other Ambulatory Visit: Payer: Self-pay

## 2018-12-22 ENCOUNTER — Encounter: Payer: Self-pay | Admitting: Endocrinology

## 2018-12-22 ENCOUNTER — Ambulatory Visit: Payer: 59 | Admitting: Endocrinology

## 2018-12-22 ENCOUNTER — Telehealth: Payer: Self-pay | Admitting: Vascular Surgery

## 2018-12-22 ENCOUNTER — Telehealth: Payer: Self-pay | Admitting: Endocrinology

## 2018-12-22 VITALS — BP 132/82 | HR 83 | Ht 66.0 in | Wt 127.2 lb

## 2018-12-22 DIAGNOSIS — E1165 Type 2 diabetes mellitus with hyperglycemia: Secondary | ICD-10-CM | POA: Diagnosis not present

## 2018-12-22 DIAGNOSIS — E782 Mixed hyperlipidemia: Secondary | ICD-10-CM | POA: Diagnosis not present

## 2018-12-22 LAB — GLUCOSE, POCT (MANUAL RESULT ENTRY): POC Glucose: 144 mg/dl — AB (ref 70–99)

## 2018-12-22 MED ORDER — ONETOUCH VERIO FLEX SYSTEM W/DEVICE KIT: PACK | 0 refills | Status: DC

## 2018-12-22 MED ORDER — REPAGLINIDE 0.5 MG PO TABS: 0.5000 mg | ORAL_TABLET | Freq: Two times a day (BID) | ORAL | 2 refills | Status: DC

## 2018-12-22 MED ORDER — GLUCOSE BLOOD VI STRP: ORAL_STRIP | 3 refills | Status: DC

## 2018-12-22 NOTE — Telephone Encounter (Signed)
Called pt and left detailed voicemail to clarify whether she is referencing the old meter or the new one that was sent to her pharmacy today. Pt was advised to call back and inform office of this and if it is the new meter, we can schedule a nurse visit to do patient education regarding new blood sugar meter.

## 2018-12-22 NOTE — Telephone Encounter (Signed)
Patient called to inform Dr. Dwyane Dee that she checked her blood sugar after her appointment and the meter read 217. Patient requests a RX for a new meter (a different brand) sent to [facility] on [address].

## 2018-12-22 NOTE — Progress Notes (Signed)
[name], female   DOB: [birth year], 68 y.o.   MRN: [id number]          Reason for Appointment: Follow-up for Type 2 Diabetes    History of Present Illness:          Date of diagnosis of type 2 diabetes mellitus: ?  2013        Background history:   She had a blood sugar over 200 in 2013 but was not told to have diabetes until 04/2015 A1c was 7.3 in 3/16 In 04/2015 blood sugar was 478 says her blood sugar was 600 when she was diagnosed and was not having any significant symptoms Initially treated with Metaglip but this apparently caused abdominal discomfort and she was switched to metformin In 2017 she had been tried on Bydureon for better control but she stopped this after a few weeks because of large persisted nodules in her skin She thinks her blood sugars were better with this and A1c was down to 6.8  Recent history:   Non-insulin hypoglycemic drugs the patient is taking are: Trulicity 1.5 mg weekly, INVOKANA 100 mg in am, metformin 1500 mg    Fructosamine last was 271 compared to 282 previously  Current management, blood sugar patterns and problems identified:  A1c is usually lower than expected and now 5.3 compared to 5.5  Her blood sugar meter appears to be reading relatively higher than the actual blood sugar  Today in the office her blood sugar was 144 and her home reading was 217 around the same time  Also lab glucose was 102 fasting blood she says her blood sugars are being checked when she gets to work at 9 AM and these are about 180-190  She only checks a few readings later in the day and these are around 300 or more  She says she does not like to check her sugar  Most of these are higher because of eating sweets are drinking sweet tea  She does try to take her Invokana consistently in the morning, also had no side effects with Trulicity  Her weight has gone back up  She is just doing some walking at times but no programmed exercise  She  does not see the dietitian because of cost        Side effects from medications have been: Metaglip caused abdominal pain, Tradjenta caused swelling of the legs, Bydureon caused skin nodules, Wilder Glade caused diarrhea, glipizide causes dizziness and weakness  Compliance with the medical regimen: Inconsistent  Glucose monitoring:  done less than 1 times a day         Glucometer: One Touch ultra 2.      Blood Glucose readings by time of day by monitor download:   PRE-MEAL Fasting Lunch Dinner Bedtime Overall  Glucose range:  183-197  117  291, 331   117-331  Mean/median:      122   POST-MEAL PC Breakfast PC Lunch PC Dinner  Glucose range:   305   Mean/median:        Previous readings:  PRE-MEAL  morning Lunch Dinner Bedtime Overall  Glucose range:  132-209  153-172     Mean/median:  160  170    164   POST-MEAL PC Breakfast PC Lunch PC Dinner  Glucose range:  ? ?  Mean/median:          Self-care:  Typical meal intake: Breakfast is Oatmeal, usually eating low-fat dinner and her snacks will be fruit,  peanut butter crackers                Dietician visit, most recent: None               Weight history:  Wt Readings from Last 3 Encounters:  12/22/18 127 lb 3.2 oz (57.7 kg)  09/15/18 122 lb 9.6 oz (55.6 kg)  08/10/18 125 lb 6 oz (56.9 kg)    Glycemic control:   Lab Results  Component Value Date   HGBA1C 5.3 12/20/2018   HGBA1C 5.5 07/04/2018   HGBA1C 6.2 02/17/2018   Lab Results  Component Value Date   MICROALBUR 21.6 (H) 12/20/2018   LDLCALC 143 (H) 12/20/2018   CREATININE 0.48 12/20/2018   Lab Results  Component Value Date   MICRALBCREAT 21.3 12/20/2018    Lab Results  Component Value Date   FRUCTOSAMINE 271 09/13/2018   FRUCTOSAMINE 282 05/08/2018   FRUCTOSAMINE 269 10/07/2017    Lab on 12/20/2018  Component Date Value Ref Range Status  . Microalb, Ur 12/20/2018 21.6* 0.0 - 1.9 mg/dL Final  . Creatinine,U 12/20/2018 101.4  mg/dL Final  . Microalb  Creat Ratio 12/20/2018 21.3  0.0 - 30.0 mg/g Final  . Cholesterol 12/20/2018 219* 0 - 200 mg/dL Final   ATP III Classification       Desirable:  < 200 mg/dL               Borderline High:  200 - 239 mg/dL          High:  > = 240 mg/dL  . Triglycerides 12/20/2018 101.0  0.0 - 149.0 mg/dL Final   Normal:  <150 mg/dLBorderline High:  150 - 199 mg/dL  . HDL 12/20/2018 56.30  >39.00 mg/dL Final  . VLDL 12/20/2018 20.2  0.0 - 40.0 mg/dL Final  . LDL Cholesterol 12/20/2018 143* 0 - 99 mg/dL Final  . Total CHOL/HDL Ratio 12/20/2018 4   Final                  Men          Women1/2 Average Risk     3.4          3.3Average Risk          5.0          4.42X Average Risk          9.6          7.13X Average Risk          15.0          11.0                      . NonHDL 12/20/2018 163.15   Final   NOTE:  Non-HDL goal should be 30 mg/dL higher than patient's LDL goal (i.e. LDL goal of < 70 mg/dL, would have non-HDL goal of < 100 mg/dL)  . Sodium 12/20/2018 137  135 - 145 mEq/L Final  . Potassium 12/20/2018 3.9  3.5 - 5.1 mEq/L Final  . Chloride 12/20/2018 101  96 - 112 mEq/L Final  . CO2 12/20/2018 27  19 - 32 mEq/L Final  . Glucose, Bld 12/20/2018 102* 70 - 99 mg/dL Final  . BUN 12/20/2018 11  6 - 23 mg/dL Final  . Creatinine, Ser 12/20/2018 0.48  0.40 - 1.20 mg/dL Final  . Total Bilirubin 12/20/2018 0.8  0.2 - 1.2 mg/dL Final  . Alkaline Phosphatase 12/20/2018 82  39 - 117  U/L Final  . AST 12/20/2018 50* 0 - 37 U/L Final  . ALT 12/20/2018 51* 0 - 35 U/L Final  . Total Protein 12/20/2018 7.1  6.0 - 8.3 g/dL Final  . Albumin 12/20/2018 3.9  3.5 - 5.2 g/dL Final  . Calcium 12/20/2018 9.0  8.4 - 10.5 mg/dL Final  . GFR 12/20/2018 129.14  >60.00 mL/min Final  . Hgb A1c MFr Bld 12/20/2018 5.3  4.6 - 6.5 % Final   Glycemic Control Guidelines for People with Diabetes:Non Diabetic:  <6%Goal of Therapy: <7%Additional Action Suggested:  >8%      Allergies as of 12/22/2018      Reactions   Fentanyl Anxiety    Made her feel very anxious, requests not to get it.   Levofloxacin Anxiety   Weird dreams    Prednisone    Anxiety and paranoia   Amoxicillin Diarrhea   Has patient had a PCN reaction causing immediate rash, facial/tongue/throat swelling, SOB or lightheadedness with hypotension: No Has patient had a PCN reaction causing severe rash involving mucus membranes or skin necrosis: No Has patient had a PCN reaction that required hospitalization: No Has patient had a PCN reaction occurring within the last 10 years: Yes If all of the above answers are "NO", then may proceed with Cephalosporin use.   Augmentin [amoxicillin-pot Clavulanate] Other (See Comments)   diarrhea   Farxiga [dapagliflozin] Diarrhea   Gi pain   Statins    Myalgia       Medication List       Accurate as of December 22, 2018  9:46 AM. If you have any questions, ask your nurse or doctor.        STOP taking these medications   ezetimibe 10 MG tablet Commonly known as: Zetia Stopped by: Elayne Snare, MD     TAKE these medications   ALPRAZolam 0.5 MG tablet Commonly known as: XANAX Take 1 tablet (0.5 mg total) by mouth at bedtime as needed for anxiety.   aspirin 325 MG tablet Take 650-975 mg by mouth daily as needed for moderate pain or headache.   azelastine 0.05 % ophthalmic solution Commonly known as: OPTIVAR INSTILL 1 DROP IN AFFECTED EYE(S) DAILY   blood glucose meter kit and supplies Kit Dispense based on patient and insurance preference. Use up to four times daily as directed. (FOR ICD-9 250.00, 250.01).   budesonide 3 MG 24 hr capsule Commonly known as: ENTOCORT EC TAKE 3 CAPSULES(9 MG) BY MOUTH DAILY   cyclobenzaprine 10 MG tablet Commonly known as: FLEXERIL take 1 tablet by mouth at bedtime if needed What changed:   how much to take  how to take this  when to take this  reasons to take this  additional instructions   diphenhydrAMINE 25 mg capsule Commonly known as: BENADRYL Take 25-50 mg  by mouth daily as needed for allergies.   diphenhydrAMINE-zinc acetate cream Commonly known as: BENADRYL Apply 1 application topically daily as needed for itching.   Effexor XR 75 MG 24 hr capsule Generic drug: venlafaxine XR TAKE 1 CAPSULE BY MOUTH TWICE DAILY WITH A MEAL   Ertugliflozin L-PyroglutamicAc 5 MG Tabs Commonly known as: Steglatro Take 5 mg by mouth daily with breakfast.   ferrous sulfate 325 (65 FE) MG tablet Take 325 mg by mouth 2 (two) times daily with a meal.   Fluticasone-Salmeterol 100-50 MCG/DOSE Aepb Commonly known as: Advair Diskus Inhale 1 puff into the lungs 2 (two) times daily. What changed:   when to  take this  reasons to take this   furosemide 20 MG tablet Commonly known as: LASIX Take 1 tablet (20 mg total) by mouth daily as needed.   glucose blood test strip Commonly known as: ONE TOUCH ULTRA TEST USE AS INSTRUCTED TO TEST ONCE DAILY   Hair Skin and Nails Formula Tabs Take 1 tablet by mouth daily.   Invokana 100 MG Tabs tablet Generic drug: canagliflozin TAKE 1 TABLET BY MOUTH BEFORE BREAKFAST   lansoprazole 30 MG capsule Commonly known as: PREVACID TAKE 1 CAPSULE(30 MG) BY MOUTH DAILY   LIVER PO Take 1 capsule by mouth 2 (two) times daily.   metFORMIN 500 MG tablet Commonly known as: GLUCOPHAGE TAKE 1 TABLET(500 MG) BY MOUTH THREE TIMES DAILY   montelukast 10 MG tablet Commonly known as: SINGULAIR TAKE 1 TABLET BY MOUTH AT BEDTIME.   ONE TOUCH ULTRA 2 w/Device Kit Use as instructed to check blood sugar once daily.   OneTouch Delica Plus [id number] Misc USE AS DIRECTED UP TO 4 TIMES DAILY AS DIRECTED   oxyCODONE-acetaminophen 5-325 MG tablet Commonly known as: PERCOCET/ROXICET Take 0.5 tablets by mouth every 8 (eight) hours as needed for severe pain.   phenylephrine 10 MG Tabs tablet Commonly known as: SUDAFED PE Take 10 mg by mouth every 4 (four) hours as needed.   promethazine 12.5 MG tablet Commonly known as:  PHENERGAN Take 1 tablet (12.5 mg total) by mouth every 8 (eight) hours as needed for nausea or vomiting.   pseudoephedrine 30 MG tablet Commonly known as: SUDAFED Take 30 mg by mouth daily as needed for congestion.   sodium chloride inhaler solution Commonly known as: BRONCHO SALINE Take 1 spray by nebulization as needed.   Trulicity 1.5 WU/9.8JX Sopn Generic drug: Dulaglutide INJECT 0.5 ML UNDER THE SKIN ONCE WEEKLY AS DIRECTED       Allergies:  Allergies  Allergen Reactions  . Fentanyl Anxiety    Made her feel very anxious, requests not to get it.  . Levofloxacin Anxiety    Weird dreams   . Prednisone     Anxiety and paranoia  . Amoxicillin Diarrhea    Has patient had a PCN reaction causing immediate rash, facial/tongue/throat swelling, SOB or lightheadedness with hypotension: No Has patient had a PCN reaction causing severe rash involving mucus membranes or skin necrosis: No Has patient had a PCN reaction that required hospitalization: No Has patient had a PCN reaction occurring within the last 10 years: Yes If all of the above answers are "NO", then may proceed with Cephalosporin use.   . Augmentin [Amoxicillin-Pot Clavulanate] Other (See Comments)    diarrhea  . Wilder Glade [Dapagliflozin] Diarrhea    Gi pain  . Statins     Myalgia     Past Medical History:  Diagnosis Date  . Allergy   . Anxiety   . Asthma   . Autoimmune hepatitis ([place]) 08/18/2016   08/2016 liver bx confirms, fibrosis not cirrhosis  . Depression   . DM (diabetes mellitus) ([place])   . 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 GERD (gastroesophageal reflux disease)   . Hiatal hernia   . Hx of adenomatous polyp of colon 07/07/2009  . Hyperlipidemia   . Hypertension   . IBS (irritable bowel syndrome)   . Iron deficiency anemia   . 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 Sinusitis, chronic   . Thyroid nodule   . Umbilical hernia   . Uterine fibroid   .  Vascular disease   . Vitamin D deficiency     Past Surgical History:  Procedure Laterality Date  . CATARACT EXTRACTION Bilateral   . COLONOSCOPY  11/1991, 06/2009  . ESOPHAGOGASTRODUODENOSCOPY  02/2002  . ESOPHAGOGASTRODUODENOSCOPY (EGD) WITH PROPOFOL N/A 06/20/2017   Procedure: ESOPHAGOGASTRODUODENOSCOPY (EGD) WITH PROPOFOL;  Surgeon: Gatha Mayer, MD;  Location: [facility];  Service: Endoscopy;  Laterality: N/A;  with Barrx  . EYE SURGERY    . NASAL SEPTOPLASTY W/ TURBINOPLASTY    . TONSILLECTOMY      Family History  Problem Relation Age of Onset  . Stroke Mother   . Hypertension Mother   . Colon cancer Father   . Liver cancer Father   . Heart disease Sister        hole in heart  . Stroke Maternal Grandmother   . Hypertension Maternal Grandmother   . Esophageal cancer Maternal Grandfather   . Breast cancer Paternal Grandmother   . Brain cancer Paternal Grandmother   . Lung cancer Paternal Grandfather   . Diabetes Neg Hx     Social History:  reports that she has never smoked. She has never used smokeless tobacco. She reports current alcohol use. She reports that she does not use drugs.   Review of Systems   Lipid history: Previously had tried Zocor which apparently caused muscle aches, she took Crestor and she thinks generic causes muscle cramps She took Livalo for a couple of days but she thinks she didn't feel well with this and had some palpitations reportedly  She was given Zetia which she thinks she felt out of sorts with this and did not take it again However her LDL is lower than before  Currently on no treatment   Lab Results  Component Value Date   CHOL 219 (H) 12/20/2018   CHOL 248 (H) 05/08/2018   CHOL 246 (H) 02/22/2017   Lab Results  Component Value Date   HDL 56.30 12/20/2018   HDL 52.50 05/08/2018   HDL 50.50 02/22/2017   Lab Results  Component Value Date   LDLCALC 143 (H) 12/20/2018   LDLCALC 170 (H) 05/08/2018   LDLCALC 174 (H)  02/22/2017   Lab Results  Component Value Date   TRIG 101.0 12/20/2018   TRIG 128.0 05/08/2018   TRIG 108.0 02/22/2017   Lab Results  Component Value Date   CHOLHDL 4 12/20/2018   CHOLHDL 5 05/08/2018   CHOLHDL 5 02/22/2017   No results found for: LDLDIRECT         Lab Results  Component Value Date   ALT 51 (H) 12/20/2018    Hypertension: None  She does take Lasix as needed because of tendency to swelling in her abdomen   BP Readings from Last 3 Encounters:  12/22/18 132/82  09/15/18 130/78  08/10/18 140/74     Most recent eye exam was 9/19  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 Microalb, Ur 12/20/2018 21.6* 0.0 - 1.9 mg/dL Final  . Creatinine,U 12/20/2018 101.4  mg/dL Final  . Microalb Creat Ratio 12/20/2018 21.3  0.0 - 30.0 mg/g Final  . Cholesterol 12/20/2018 219* 0 - 200 mg/dL Final   ATP III Classification       Desirable:  < 200 mg/dL               Borderline High:  200 - 239 mg/dL  High:  > = 240 mg/dL  . Triglycerides 12/20/2018 101.0  0.0 - 149.0 mg/dL Final   Normal:  <150 mg/dLBorderline High:  150 - 199 mg/dL  . HDL 12/20/2018 56.30  >39.00 mg/dL Final  . VLDL 12/20/2018 20.2  0.0 - 40.0 mg/dL Final  . LDL Cholesterol 12/20/2018 143* 0 - 99 mg/dL Final  . Total CHOL/HDL Ratio 12/20/2018 4   Final                  Men          Women1/2 Average Risk     3.4          3.3Average Risk          5.0          4.42X Average Risk          9.6          7.13X Average Risk          15.0          11.0                      . NonHDL 12/20/2018 163.15   Final   NOTE:  Non-HDL goal should be 30 mg/dL higher than patient's LDL goal (i.e. LDL goal of < 70 mg/dL, would have non-HDL goal of < 100 mg/dL)  . Sodium 12/20/2018 137  135 - 145 mEq/L Final  . Potassium 12/20/2018 3.9  3.5 - 5.1 mEq/L Final  . Chloride 12/20/2018 101  96 - 112 mEq/L Final  . CO2  12/20/2018 27  19 - 32 mEq/L Final  . Glucose, Bld 12/20/2018 102* 70 - 99 mg/dL Final  . BUN 12/20/2018 11  6 - 23 mg/dL Final  . Creatinine, Ser 12/20/2018 0.48  0.40 - 1.20 mg/dL Final  . Total Bilirubin 12/20/2018 0.8  0.2 - 1.2 mg/dL Final  . Alkaline Phosphatase 12/20/2018 82  39 - 117 U/L Final  . AST 12/20/2018 50* 0 - 37 U/L Final  . ALT 12/20/2018 51* 0 - 35 U/L Final  . Total Protein 12/20/2018 7.1  6.0 - 8.3 g/dL Final  . Albumin 12/20/2018 3.9  3.5 - 5.2 g/dL Final  . Calcium 12/20/2018 9.0  8.4 - 10.5 mg/dL Final  . GFR 12/20/2018 129.14  >60.00 mL/min Final  . Hgb A1c MFr Bld 12/20/2018 5.3  4.6 - 6.5 % Final   Glycemic Control Guidelines for People with Diabetes:Non Diabetic:  <6%Goal of Therapy: <7%Additional Action Suggested:  >8%     Physical Examination:  BP 132/82 (BP Location: Left Arm, Patient Position: Sitting, Cuff Size: Normal)   Pulse 83   Ht '5\' 6"'$  (1.676 m)   Wt 127 lb 3.2 oz (57.7 kg)   SpO2 97%   BMI 20.53 kg/m          ASSESSMENT:  Diabetes type 2, nonobese  See history of present illness for detailed discussion of current diabetes management, blood sugar patterns and problems identified  A1c has been usually lower than expected, now 5.3 Fructosamine has been previously modestly increased at 271  Blood sugar analysis is difficult to do because of her meter probably reading higher than actual reading Also she appears to have mostly high readings at home even though her lab glucose was only 102 fasting Currently appears to have mostly high postprandial readings Also again her compliance with diet is consistently poor    PLAN:     She  will be given a trial of Prandin 0.5 mg with every meal that has carbohydrates She will continue Invokana and Trulicity along with metformin She is trying to improve her diet She will start using a One Touch Verio meter   Fructosamine will be more useful than A1c to monitor her progress and will also be  done on the next visit  Lipids: Reassured her that Zetia does not cause malaise and she can try it again and take it at bedtime  She will discuss her abnormal liver functions with gastroenterologist  There are no Patient Instructions on file for this visit.      Elayne Snare 12/22/2018, 9:46 AM   Note: This office note was prepared with Dragon voice recognition system technology. Any transcriptional errors that result from this process are unintentional.

## 2018-12-22 NOTE — Telephone Encounter (Signed)
[employer]

## 2018-12-22 NOTE — Telephone Encounter (Signed)
LMOM to call the office and R/S her appt from May

## 2018-12-22 NOTE — Telephone Encounter (Signed)
Patient stated that on her glucose meter is giving her a 25 error that stands for a collection error.

## 2018-12-22 NOTE — Telephone Encounter (Signed)
Do you want a different brand or just a new meter?

## 2018-12-22 NOTE — Telephone Encounter (Signed)
Rx sent 

## 2018-12-22 NOTE — Patient Instructions (Signed)
Take Prandin before any carbs or sweet drinks

## 2018-12-28 ENCOUNTER — Other Ambulatory Visit: Payer: Self-pay | Admitting: Internal Medicine

## 2019-01-17 ENCOUNTER — Other Ambulatory Visit: Payer: Self-pay | Admitting: Endocrinology

## 2019-01-17 ENCOUNTER — Other Ambulatory Visit: Payer: Self-pay | Admitting: Internal Medicine

## 2019-01-17 DIAGNOSIS — K219 Gastro-esophageal reflux disease without esophagitis: Secondary | ICD-10-CM

## 2019-01-29 ENCOUNTER — Other Ambulatory Visit: Payer: Self-pay | Admitting: Internal Medicine

## 2019-02-08 ENCOUNTER — Other Ambulatory Visit: Payer: Self-pay | Admitting: Internal Medicine

## 2019-02-08 DIAGNOSIS — D500 Iron deficiency anemia secondary to blood loss (chronic): Secondary | ICD-10-CM

## 2019-02-08 DIAGNOSIS — K754 Autoimmune hepatitis: Secondary | ICD-10-CM

## 2019-02-24 ENCOUNTER — Other Ambulatory Visit: Payer: Self-pay | Admitting: Endocrinology

## 2019-02-27 ENCOUNTER — Other Ambulatory Visit: Payer: Self-pay

## 2019-02-27 ENCOUNTER — Other Ambulatory Visit (INDEPENDENT_AMBULATORY_CARE_PROVIDER_SITE_OTHER): Payer: 59

## 2019-02-27 DIAGNOSIS — E1165 Type 2 diabetes mellitus with hyperglycemia: Secondary | ICD-10-CM

## 2019-02-27 DIAGNOSIS — E782 Mixed hyperlipidemia: Secondary | ICD-10-CM

## 2019-02-28 LAB — COMPREHENSIVE METABOLIC PANEL
ALT: 34 U/L (ref 0–35)
AST: 32 U/L (ref 0–37)
Albumin: 4 g/dL (ref 3.5–5.2)
Alkaline Phosphatase: 100 U/L (ref 39–117)
BUN: 15 mg/dL (ref 6–23)
CO2: 35 mEq/L — ABNORMAL HIGH (ref 19–32)
Calcium: 9.8 mg/dL (ref 8.4–10.5)
Chloride: 97 mEq/L (ref 96–112)
Creatinine, Ser: 0.55 mg/dL (ref 0.40–1.20)
GFR: 110.3 mL/min (ref >60.00)
Glucose, Bld: 68 mg/dL — ABNORMAL LOW (ref 70–99)
Potassium: 4.1 mEq/L (ref 3.5–5.1)
Sodium: 136 mEq/L (ref 135–145)
Total Bilirubin: 0.7 mg/dL (ref 0.2–1.2)
Total Protein: 7.7 g/dL (ref 6.0–8.3)

## 2019-02-28 LAB — LDL CHOLESTEROL, DIRECT: Direct LDL: 153 mg/dL

## 2019-02-28 LAB — FRUCTOSAMINE: Fructosamine: 265 umol/L (ref 0–285)

## 2019-03-02 ENCOUNTER — Encounter: Payer: Self-pay | Admitting: Endocrinology

## 2019-03-02 ENCOUNTER — Other Ambulatory Visit: Payer: Self-pay

## 2019-03-02 ENCOUNTER — Ambulatory Visit: Payer: 59 | Admitting: Endocrinology

## 2019-03-02 VITALS — BP 110/70 | HR 88 | Ht 66.0 in | Wt 131.0 lb

## 2019-03-02 DIAGNOSIS — IMO0001 Reserved for inherently not codable concepts without codable children: Secondary | ICD-10-CM

## 2019-03-02 DIAGNOSIS — E7800 Pure hypercholesterolemia, unspecified: Secondary | ICD-10-CM | POA: Diagnosis not present

## 2019-03-02 DIAGNOSIS — E1165 Type 2 diabetes mellitus with hyperglycemia: Secondary | ICD-10-CM | POA: Diagnosis not present

## 2019-03-02 DIAGNOSIS — E041 Nontoxic single thyroid nodule: Secondary | ICD-10-CM | POA: Diagnosis not present

## 2019-03-02 NOTE — Patient Instructions (Signed)
Check blood sugars on waking up 2 days a week  Also check blood sugars about 2 hours after meals and do this after different meals by rotation  Recommended blood sugar levels on waking up are 90-130 and about 2 hours after meal is 130-160  Please bring your blood sugar monitor to each visit, thank you   

## 2019-03-02 NOTE — Progress Notes (Signed)
[name], female   DOB: 12/02/1950, 68 y.o.   MRN: [id number]          Reason for Appointment: Follow-up for Type 2 Diabetes    History of Present Illness:          Date of diagnosis of type 2 diabetes mellitus: ?  2013        Background history:   She had a blood sugar over 200 in 2013 but was not told to have diabetes until 04/2015 A1c was 7.3 in 3/16 In 04/2015 blood sugar was 478 says her blood sugar was 600 when she was diagnosed and was not having any significant symptoms Initially treated with Metaglip but this apparently caused abdominal discomfort and she was switched to metformin In 2017 she had been tried on Bydureon for better control but she stopped this after a few weeks because of large persisted nodules in her skin She thinks her blood sugars were better with this and A1c was down to 6.8  Recent history:   Non-insulin hypoglycemic drugs the patient is taking are: Trulicity 1.5 mg weekly, INVOKANA 100 mg in am, Prandin 0.5 mg twice daily   Fructosamine now is 265, previously 271  Current management, blood sugar patterns and problems identified:  A1c is usually lower than expected and last 5.3 compared to 5.5  Her postprandial readings have been high and she was started on Prandin in July  She is taking this twice a day and usually with the higher carbohydrate meals which could be either breakfast or dinner or lunch and dinner  Although she has no hypoglycemic symptoms with taking this her glucose in the lab was only 68 postprandial but she does not know what she had a week  Recently has checked blood sugar very infrequently  Now using the Verio monitor and appears that this is more accurate and her One Touch ultra to monitor was reading falsely high with blood sugars as high as 331  Recently most of her readings are in the early afternoon  Her weight has gone back up another 4 pounds, previously had been concerned about weight loss from  Invokana  She has started walking about 15 minutes at least 3 days a week now  She stopped taking metformin because of GI side effects without obvious increase in her blood sugars  She does not see the dietitian because of cost        Side effects from medications have been: Metaglip caused abdominal pain, Tradjenta caused swelling of the legs, Bydureon caused skin nodules, Farxiga caused diarrhea, glipizide causes dizziness and weakness  Compliance with the medical regimen: Inconsistent  Glucose monitoring:  done less than 1 times a day         Glucometer: One Verio    Blood Glucose readings by time of day by monitor download:  Recent range 97-147 with most readings around 1 PM  Previous readings with a One Touch ultra 2  PRE-MEAL Fasting Lunch Dinner Bedtime Overall  Glucose range:  183-197  117  291, 331   117-331  Mean/median:      122   POST-MEAL PC Breakfast PC Lunch PC Dinner  Glucose range:   305   Mean/median:         Self-care:  Typical meal intake: Breakfast is Oatmeal, usually eating low-fat dinner and her snacks will be fruit, peanut butter crackers  Dietician visit, most recent: None               Weight history:  Wt Readings from Last 3 Encounters:  03/02/19 131 lb (59.4 kg)  12/22/18 127 lb 3.2 oz (57.7 kg)  09/15/18 122 lb 9.6 oz (55.6 kg)    Glycemic control:   Lab Results  Component Value Date   HGBA1C 5.3 12/20/2018   HGBA1C 5.5 07/04/2018   HGBA1C 6.2 02/17/2018   Lab Results  Component Value Date   MICROALBUR 21.6 (H) 12/20/2018   LDLCALC 143 (H) 12/20/2018   CREATININE 0.55 02/27/2019   Lab Results  Component Value Date   MICRALBCREAT 21.3 12/20/2018    Lab Results  Component Value Date   FRUCTOSAMINE 265 02/27/2019   FRUCTOSAMINE 271 09/13/2018   FRUCTOSAMINE 282 05/08/2018    Lab on 02/27/2019  Component Date Value Ref Range Status  . Direct LDL 02/27/2019 153.0  mg/dL Final   Optimal:  <100 mg/dLNear or  Above Optimal:  100-129 mg/dLBorderline High:  130-159 mg/dLHigh:  160-189 mg/dLVery High:  >190 mg/dL  . Fructosamine 02/27/2019 265  0 - 285 umol/L Final   Comment: Published reference interval for apparently healthy subjects between age 67 and 21 is 78 - 285 umol/L and in a poorly controlled diabetic population is 228 - 563 umol/L with a mean of 396 umol/L.   Marland Kitchen Sodium 02/27/2019 136  135 - 145 mEq/L Final  . Potassium 02/27/2019 4.1  3.5 - 5.1 mEq/L Final  . Chloride 02/27/2019 97  96 - 112 mEq/L Final  . CO2 02/27/2019 35* 19 - 32 mEq/L Final  . Glucose, Bld 02/27/2019 68* 70 - 99 mg/dL Final  . BUN 02/27/2019 15  6 - 23 mg/dL Final  . Creatinine, Ser 02/27/2019 0.55  0.40 - 1.20 mg/dL Final  . Total Bilirubin 02/27/2019 0.7  0.2 - 1.2 mg/dL Final  . Alkaline Phosphatase 02/27/2019 100  39 - 117 U/L Final  . AST 02/27/2019 32  0 - 37 U/L Final  . ALT 02/27/2019 34  0 - 35 U/L Final  . Total Protein 02/27/2019 7.7  6.0 - 8.3 g/dL Final  . Albumin 02/27/2019 4.0  3.5 - 5.2 g/dL Final  . Calcium 02/27/2019 9.8  8.4 - 10.5 mg/dL Final  . GFR 02/27/2019 110.30  >60.00 mL/min Final     Allergies as of 03/02/2019      Reactions   Fentanyl Anxiety   Made her feel very anxious, requests not to get it.   Levofloxacin Anxiety   Weird dreams    Prednisone    Anxiety and paranoia   Amoxicillin Diarrhea   Has patient had a PCN reaction causing immediate rash, facial/tongue/throat swelling, SOB or lightheadedness with hypotension: No Has patient had a PCN reaction causing severe rash involving mucus membranes or skin necrosis: No Has patient had a PCN reaction that required hospitalization: No Has patient had a PCN reaction occurring within the last 10 years: Yes If all of the above answers are "NO", then may proceed with Cephalosporin use.   Augmentin [amoxicillin-pot Clavulanate] Other (See Comments)   diarrhea   Farxiga [dapagliflozin] Diarrhea   Gi pain   Statins    Myalgia        Medication List       Accurate as of March 02, 2019 11:59 PM. If you have any questions, ask your nurse or doctor.        STOP taking these medications   Ertugliflozin  L-PyroglutamicAc 5 MG Tabs Commonly known as: Catering manager Stopped by: Elayne Snare, MD   oxyCODONE-acetaminophen 5-325 MG tablet Commonly known as: PERCOCET/ROXICET Stopped by: Elayne Snare, MD     TAKE these medications   ALPRAZolam 0.5 MG tablet Commonly known as: XANAX Take 1 tablet (0.5 mg total) by mouth at bedtime as needed for anxiety.   aspirin 325 MG tablet Take 650-975 mg by mouth daily as needed for moderate pain or headache.   azelastine 0.05 % ophthalmic solution Commonly known as: OPTIVAR INSTILL 1 DROP IN AFFECTED EYE(S) DAILY   budesonide 3 MG 24 hr capsule Commonly known as: ENTOCORT EC TAKE 3 CAPSULES(9 MG) BY MOUTH DAILY   cyclobenzaprine 10 MG tablet Commonly known as: FLEXERIL take 1 tablet by mouth at bedtime if needed What changed:   how much to take  how to take this  when to take this  reasons to take this  additional instructions   diphenhydrAMINE 25 mg capsule Commonly known as: BENADRYL Take 25-50 mg by mouth daily as needed for allergies.   diphenhydrAMINE-zinc acetate cream Commonly known as: BENADRYL Apply 1 application topically daily as needed for itching.   Effexor XR 75 MG 24 hr capsule Generic drug: venlafaxine XR TAKE 1 CAPSULE BY MOUTH TWICE DAILY WITH A MEAL   ferrous sulfate 325 (65 FE) MG tablet Take 325 mg by mouth 2 (two) times daily with a meal.   Fluticasone-Salmeterol 100-50 MCG/DOSE Aepb Commonly known as: Advair Diskus Inhale 1 puff into the lungs 2 (two) times daily. What changed:   when to take this  reasons to take this   furosemide 20 MG tablet Commonly known as: LASIX Take 1 tablet (20 mg total) by mouth daily as needed.   glucose blood test strip Use Onetouch verio test strips as instructed to check blood sugar once  daily.   Hair Skin and Nails Formula Tabs Take 1 tablet by mouth daily.   Invokana 100 MG Tabs tablet Generic drug: canagliflozin TAKE 1 TABLET BY MOUTH BEFORE BREAKFAST   lansoprazole 30 MG capsule Commonly known as: PREVACID TAKE 1 CAPSULE(30 MG) BY MOUTH DAILY   LIVER PO Take 1 capsule by mouth 2 (two) times daily.   metFORMIN 500 MG tablet Commonly known as: GLUCOPHAGE TAKE 1 TABLET(500 MG) BY MOUTH THREE TIMES DAILY   montelukast 10 MG tablet Commonly known as: SINGULAIR TAKE 1 TABLET BY MOUTH AT BEDTIME.   OneTouch Delica Plus [id number] Misc USE AS DIRECTED UP TO 4 TIMES DAILY AS DIRECTED   OneTouch Verio Flex System w/Device Kit Use Onetouch Verio Flex to check blood sugar once daily.   phenylephrine 10 MG Tabs tablet Commonly known as: SUDAFED PE Take 10 mg by mouth every 4 (four) hours as needed.   promethazine 12.5 MG tablet Commonly known as: PHENERGAN Take 1 tablet (12.5 mg total) by mouth every 8 (eight) hours as needed for nausea or vomiting.   pseudoephedrine 30 MG tablet Commonly known as: SUDAFED Take 30 mg by mouth daily as needed for congestion.   repaglinide 0.5 MG tablet Commonly known as: Prandin Take 1 tablet (0.5 mg total) by mouth 2 (two) times daily before a meal.   sodium chloride inhaler solution Commonly known as: BRONCHO SALINE Take 1 spray by nebulization as needed.   Trulicity 1.5 EX/5.1ZG Sopn Generic drug: Dulaglutide INJECT 0.5 ML UNDER THE SKIN ONCE WEEKLY AS DIRECTED       Allergies:  Allergies  Allergen Reactions  . Fentanyl Anxiety  Made her feel very anxious, requests not to get it.  . Levofloxacin Anxiety    Weird dreams   . Prednisone     Anxiety and paranoia  . Amoxicillin Diarrhea    Has patient had a PCN reaction causing immediate rash, facial/tongue/throat swelling, SOB or lightheadedness with hypotension: No Has patient had a PCN reaction causing severe rash involving mucus membranes or skin necrosis:  No Has patient had a PCN reaction that required hospitalization: No Has patient had a PCN reaction occurring within the last 10 years: Yes If all of the above answers are "NO", then may proceed with Cephalosporin use.   . Augmentin [Amoxicillin-Pot Clavulanate] Other (See Comments)    diarrhea  . Wilder Glade [Dapagliflozin] Diarrhea    Gi pain  . Statins     Myalgia     Past Medical History:  Diagnosis Date  . Allergy   . Anxiety   . Asthma   . Autoimmune hepatitis ([place]) 08/18/2016   08/2016 liver bx confirms, fibrosis not cirrhosis  . Depression   . DM (diabetes mellitus) ([place])   . 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 GERD (gastroesophageal reflux disease)   . Hiatal hernia   . Hx of adenomatous polyp of colon 07/07/2009  . Hyperlipidemia   . Hypertension   . IBS (irritable bowel syndrome)   . Iron deficiency anemia   . 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 Sinusitis, chronic   . Thyroid nodule   . Umbilical hernia   . Uterine fibroid   . Vascular disease   . Vitamin D deficiency     Past Surgical History:  Procedure Laterality Date  . CATARACT EXTRACTION Bilateral   . COLONOSCOPY  11/1991, 06/2009  . ESOPHAGOGASTRODUODENOSCOPY  02/2002  . ESOPHAGOGASTRODUODENOSCOPY (EGD) WITH PROPOFOL N/A 06/20/2017   Procedure: ESOPHAGOGASTRODUODENOSCOPY (EGD) WITH PROPOFOL;  Surgeon: Gatha Mayer, MD;  Location: [facility];  Service: Endoscopy;  Laterality: N/A;  with Barrx  . EYE SURGERY    . NASAL SEPTOPLASTY W/ TURBINOPLASTY    . TONSILLECTOMY      Family History  Problem Relation Age of Onset  . Stroke Mother   . Hypertension Mother   . Colon cancer Father   . Liver cancer Father   . Heart disease Sister        hole in heart  . Stroke Maternal Grandmother   . Hypertension Maternal Grandmother   . Esophageal cancer Maternal Grandfather   . Breast cancer Paternal Grandmother   . Brain cancer Paternal Grandmother   . Lung  cancer Paternal Grandfather   . Diabetes Neg Hx     Social History:  reports that she has never smoked. She has never used smokeless tobacco. She reports current alcohol use. She reports that she does not use drugs.   Review of Systems   Lipid history: She has pure hypercholesterolemia Highest baseline LDL about 170  Previously had tried Zocor which apparently caused muscle aches, she took Crestor and she thinks generic causes muscle cramps She took Livalo for a couple of days but she thinks she didn't feel well with this and had some palpitations reportedly  She was given Zetia  However her LDL is slightly higher than before, now done with direct LDL   Lab Results  Component Value Date   CHOL 219 (H) 12/20/2018   CHOL 248 (H) 05/08/2018   CHOL 246 (H) 02/22/2017   Lab Results  Component Value Date  HDL 56.30 12/20/2018   HDL 52.50 05/08/2018   HDL 50.50 02/22/2017   Lab Results  Component Value Date   LDLCALC 143 (H) 12/20/2018   LDLCALC 170 (H) 05/08/2018   LDLCALC 174 (H) 02/22/2017   Lab Results  Component Value Date   TRIG 101.0 12/20/2018   TRIG 128.0 05/08/2018   TRIG 108.0 02/22/2017   Lab Results  Component Value Date   CHOLHDL 4 12/20/2018   CHOLHDL 5 05/08/2018   CHOLHDL 5 02/22/2017   Lab Results  Component Value Date   LDLDIRECT 153.0 02/27/2019           Lab Results  Component Value Date   ALT 34 02/27/2019    Hypertension: None  She does take Lasix as needed because of tendency to swelling in her abdomen   BP Readings from Last 3 Encounters:  03/02/19 110/70  12/22/18 132/82  09/15/18 130/78     Most recent eye exam was 9/19  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 Direct LDL 02/27/2019 153.0  mg/dL Final   Optimal:  <100 mg/dLNear or Above Optimal:  100-129 mg/dLBorderline High:  130-159 mg/dLHigh:  160-189 mg/dLVery High:  >190 mg/dL  . Fructosamine 02/27/2019 265  0 - 285 umol/L Final   Comment: Published reference interval for apparently healthy subjects between age 66 and 17 is 53 - 285 umol/L and in a poorly controlled diabetic population is 228 - 563 umol/L with a mean of 396 umol/L.   Marland Kitchen Sodium 02/27/2019 136  135 - 145 mEq/L Final  . Potassium 02/27/2019 4.1  3.5 - 5.1 mEq/L Final  . Chloride 02/27/2019 97  96 - 112 mEq/L Final  . CO2 02/27/2019 35* 19 - 32 mEq/L Final  . Glucose, Bld 02/27/2019 68* 70 - 99 mg/dL Final  . BUN 02/27/2019 15  6 - 23 mg/dL Final  . Creatinine, Ser 02/27/2019 0.55  0.40 - 1.20 mg/dL Final  . Total Bilirubin 02/27/2019 0.7  0.2 - 1.2 mg/dL Final  . Alkaline Phosphatase 02/27/2019 100  39 - 117 U/L Final  . AST 02/27/2019 32  0 - 37 U/L Final  . ALT 02/27/2019 34  0 - 35 U/L Final  . Total Protein 02/27/2019 7.7  6.0 - 8.3 g/dL Final  . Albumin 02/27/2019 4.0  3.5 - 5.2 g/dL Final  . Calcium 02/27/2019 9.8  8.4 - 10.5 mg/dL Final  . GFR 02/27/2019 110.30  >60.00 mL/min Final    Physical Examination:  BP 110/70   Pulse 88   Ht '5\' 6"'$  (1.676 m)   Wt 131 lb (59.4 kg)   SpO2 98%   BMI 21.14 kg/m       ASSESSMENT:  Diabetes type 2, nonobese  See history of present illness for detailed discussion of current diabetes management, blood sugar patterns and problems identified  A1c has been usually lower than expected, last 5.3 Fructosamine has been previously high normal and now slightly better at 265  She is getting more accurate blood sugars using the Verio meter However she is not monitoring enough blood sugars especially after dinner and also not clear fasting readings may be higher She  does not want to take metformin because of GI side effects and has  stopped this on her own However currently postprandial readings are controlled with only 0.5 mg Prandin She is also likely doing better with her diet with cutting back on sweets and sweet drinks  LIPIDS: Only fair control but she can only tolerate Zetia, LDL 153 but better than baseline labs  Liver functions are back to normal   PLAN:     No change in medication regimen Can leave off metformin for now  She can take brand-name only when she is having significant carbohydrate at meals and not for just snacks or low carbohydrate meals since she has had blood sugars as low as 68 seen on her labs However discussed checking blood sugars more often with alternating fasting and postprandial after various meals Blood sugar targets discussed  Hypercholesterolemia: Continue Zetia, consider adding WelChol  History of thyroid nodule: Will discuss and review on the next visit, may need to consider another ultrasound since none available since 2012  Patient Instructions  Check blood sugars on waking up 2days a week  Also check blood sugars about 2 hours after meals and do this after different meals by rotation  Recommended blood sugar levels on waking up are 90-130 and about 2 hours after meal is 130-160  Please bring your blood sugar monitor to each visit, thank you         Elayne Snare 03/04/2019, 8:32 PM   Note: This office note was prepared with Dragon voice recognition system technology. Any transcriptional errors that result from this process are unintentional.

## 2019-03-13 ENCOUNTER — Other Ambulatory Visit: Payer: Self-pay | Admitting: Internal Medicine

## 2019-03-13 ENCOUNTER — Other Ambulatory Visit: Payer: Self-pay | Admitting: Family Medicine

## 2019-03-13 DIAGNOSIS — J40 Bronchitis, not specified as acute or chronic: Secondary | ICD-10-CM

## 2019-03-19 ENCOUNTER — Other Ambulatory Visit: Payer: Self-pay | Admitting: Endocrinology

## 2019-03-25 ENCOUNTER — Other Ambulatory Visit: Payer: Self-pay | Admitting: Internal Medicine

## 2019-03-25 DIAGNOSIS — D500 Iron deficiency anemia secondary to blood loss (chronic): Secondary | ICD-10-CM

## 2019-04-02 ENCOUNTER — Other Ambulatory Visit: Payer: Self-pay | Admitting: Family Medicine

## 2019-04-02 ENCOUNTER — Other Ambulatory Visit (INDEPENDENT_AMBULATORY_CARE_PROVIDER_SITE_OTHER): Payer: 59

## 2019-04-02 DIAGNOSIS — D500 Iron deficiency anemia secondary to blood loss (chronic): Secondary | ICD-10-CM | POA: Diagnosis not present

## 2019-04-02 DIAGNOSIS — Z1231 Encounter for screening mammogram for malignant neoplasm of breast: Secondary | ICD-10-CM

## 2019-04-02 LAB — CBC
HCT: 45.4 % (ref 36.0–46.0)
Hemoglobin: 15.3 g/dL — ABNORMAL HIGH (ref 12.0–15.0)
MCHC: 33.7 g/dL (ref 30.0–36.0)
MCV: 94 fl (ref 78.0–100.0)
Platelets: 227 10*3/uL (ref 150.0–400.0)
RBC: 4.83 Mil/uL (ref 3.87–5.11)
RDW: 14.9 % (ref 11.5–15.5)
WBC: 10.3 10*3/uL (ref 4.0–10.5)

## 2019-04-02 LAB — FERRITIN: Ferritin: 14.5 ng/mL (ref 10.0–291.0)

## 2019-04-05 ENCOUNTER — Other Ambulatory Visit: Payer: Self-pay | Admitting: Internal Medicine

## 2019-04-05 ENCOUNTER — Other Ambulatory Visit: Payer: Self-pay

## 2019-04-05 DIAGNOSIS — D500 Iron deficiency anemia secondary to blood loss (chronic): Secondary | ICD-10-CM

## 2019-04-05 LAB — HM DIABETES EYE EXAM

## 2019-04-05 NOTE — Progress Notes (Signed)
Miranda Cochran,  Ferritin is very low normal but Hemoglobin is ok - slightly high for some reason so will keep an eye on it. Bottom line is I think what you are doing is working.  We will add on a blood count and ferritin to the labs you are doing for Dr. Dwyane Dee on 12/20 and see what they show  Take care  I appreciate the opportunity to care for you. Gatha Mayer, MD, [facility]

## 2019-04-05 NOTE — Progress Notes (Signed)
Please add CBC and ferritin to labs expected 12/20 for [facility] Dx blood loss anemia

## 2019-04-19 ENCOUNTER — Telehealth: Payer: Self-pay

## 2019-04-19 DIAGNOSIS — R52 Pain, unspecified: Secondary | ICD-10-CM

## 2019-04-19 NOTE — Telephone Encounter (Signed)
Refill x 6 mos 

## 2019-04-19 NOTE — Telephone Encounter (Signed)
Pharmacy requesting effexor XR refill, please advise Sir, thank you.

## 2019-04-20 MED ORDER — EFFEXOR XR 75 MG PO CP24: ORAL_CAPSULE | ORAL | 5 refills | Status: DC

## 2019-04-20 NOTE — Telephone Encounter (Signed)
Effexor refilled.

## 2019-04-23 ENCOUNTER — Other Ambulatory Visit: Payer: Self-pay | Admitting: Endocrinology

## 2019-04-25 ENCOUNTER — Other Ambulatory Visit: Payer: Self-pay | Admitting: Internal Medicine

## 2019-05-23 ENCOUNTER — Other Ambulatory Visit: Payer: Self-pay

## 2019-05-23 ENCOUNTER — Ambulatory Visit
Admission: RE | Admit: 2019-05-23 | Discharge: 2019-05-23 | Disposition: A | Discharge status: Home / Self Care | Payer: 59 | Source: Ambulatory Visit | Attending: Family Medicine | Admitting: Family Medicine

## 2019-05-23 DIAGNOSIS — Z1231 Encounter for screening mammogram for malignant neoplasm of breast: Secondary | ICD-10-CM

## 2019-05-25 ENCOUNTER — Telehealth: Payer: Self-pay

## 2019-05-25 NOTE — Telephone Encounter (Signed)
I got patient's brand name Effexor 75mg  approved thru 05/24/2020 doing the prior authorization with [employer]. [facility] pharmacy informed.

## 2019-05-30 ENCOUNTER — Other Ambulatory Visit: Payer: Self-pay | Admitting: Endocrinology

## 2019-05-30 ENCOUNTER — Other Ambulatory Visit: Payer: 59

## 2019-05-31 ENCOUNTER — Other Ambulatory Visit: Payer: Self-pay | Admitting: Internal Medicine

## 2019-06-01 ENCOUNTER — Other Ambulatory Visit: Payer: Self-pay

## 2019-06-04 ENCOUNTER — Other Ambulatory Visit: Payer: Self-pay

## 2019-06-04 ENCOUNTER — Ambulatory Visit: Payer: 59 | Admitting: Endocrinology

## 2019-06-04 ENCOUNTER — Encounter: Payer: Self-pay | Admitting: Endocrinology

## 2019-06-04 ENCOUNTER — Ambulatory Visit (INDEPENDENT_AMBULATORY_CARE_PROVIDER_SITE_OTHER): Payer: 59 | Admitting: Endocrinology

## 2019-06-04 VITALS — BP 160/100 | HR 93 | Ht 66.0 in | Wt 137.2 lb

## 2019-06-04 DIAGNOSIS — E1165 Type 2 diabetes mellitus with hyperglycemia: Secondary | ICD-10-CM | POA: Diagnosis not present

## 2019-06-04 DIAGNOSIS — E876 Hypokalemia: Secondary | ICD-10-CM | POA: Diagnosis not present

## 2019-06-04 DIAGNOSIS — E041 Nontoxic single thyroid nodule: Secondary | ICD-10-CM

## 2019-06-04 DIAGNOSIS — IMO0001 Reserved for inherently not codable concepts without codable children: Secondary | ICD-10-CM

## 2019-06-04 DIAGNOSIS — D500 Iron deficiency anemia secondary to blood loss (chronic): Secondary | ICD-10-CM | POA: Diagnosis not present

## 2019-06-04 LAB — CBC
HCT: 43.2 % (ref 36.0–46.0)
Hemoglobin: 14.6 g/dL (ref 12.0–15.0)
MCHC: 33.7 g/dL (ref 30.0–36.0)
MCV: 94.5 fl (ref 78.0–100.0)
Platelets: 192 10*3/uL (ref 150.0–400.0)
RBC: 4.58 Mil/uL (ref 3.87–5.11)
RDW: 14.5 % (ref 11.5–15.5)
WBC: 8.5 10*3/uL (ref 4.0–10.5)

## 2019-06-04 LAB — COMPREHENSIVE METABOLIC PANEL
ALT: 38 U/L — ABNORMAL HIGH (ref 0–35)
AST: 36 U/L (ref 0–37)
Albumin: 4.3 g/dL (ref 3.5–5.2)
Alkaline Phosphatase: 105 U/L (ref 39–117)
BUN: 11 mg/dL (ref 6–23)
CO2: 28 mEq/L (ref 19–32)
Calcium: 9.6 mg/dL (ref 8.4–10.5)
Chloride: 98 mEq/L (ref 96–112)
Creatinine, Ser: 0.49 mg/dL (ref 0.40–1.20)
GFR: 125.93 mL/min (ref >60.00)
Glucose, Bld: 88 mg/dL (ref 70–99)
Potassium: 3.4 mEq/L — ABNORMAL LOW (ref 3.5–5.1)
Sodium: 135 mEq/L (ref 135–145)
Total Bilirubin: 1 mg/dL (ref 0.2–1.2)
Total Protein: 8.2 g/dL (ref 6.0–8.3)

## 2019-06-04 LAB — TSH: TSH: 0.91 u[IU]/mL (ref 0.35–4.50)

## 2019-06-04 LAB — POCT GLYCOSYLATED HEMOGLOBIN (HGB A1C): Hemoglobin A1C: 5.8 % — AB (ref 4.0–5.6)

## 2019-06-04 LAB — FERRITIN: Ferritin: 18.7 ng/mL (ref 10.0–291.0)

## 2019-06-04 NOTE — Progress Notes (Signed)
Please call to let patient know that his hemoglobin is low, needs to discuss supplementation with PCP.  Also has discussed needs to see PCP for follow-up of high blood pressure from today.  Liver test slightly high

## 2019-06-04 NOTE — Patient Instructions (Addendum)
Check blood sugars on waking up 2-3 days a week  Also check blood sugars about 2 hours after meals and do this after different meals by rotation  Recommended blood sugar levels on waking up are 90-130 and about 2 hours after meal is 130-160  Please bring your blood sugar monitor to each visit, thank you   

## 2019-06-04 NOTE — Progress Notes (Addendum)
[name], female   DOB: 16-Jun-1950, 68 y.o.   MRN: [id number]          Reason for Appointment: Follow-up for Type 2 Diabetes    History of Present Illness:          Date of diagnosis of type 2 diabetes mellitus: ?  2013        Background history:   She had a blood sugar over 200 in 2013 but was not told to have diabetes until 04/2015 A1c was 7.3 in 3/16 In 04/2015 blood sugar was 478 says her blood sugar was 600 when she was diagnosed and was not having any significant symptoms Initially treated with Metaglip but this apparently caused abdominal discomfort and she was switched to metformin In 2017 she had been tried on Bydureon for better control but she stopped this after a few weeks because of large persisted nodules in her skin She thinks her blood sugars were better with this and A1c was down to 6.8  Recent history:   Non-insulin hypoglycemic drugs the patient is taking are: Trulicity 1.5 mg weekly,Prandin 0.5 mg twice daily  Her A1c is 5.8, previously 5.3 Fructosamine pending, previously 265  Current management, blood sugar patterns and problems identified:  She was last seen over 3 months ago   She stopped taking her Invokana a few days ago because she developed a yeast infection but did not call for treatment  She now refuses to start back on this  Also she does not like injections and wants to stop Trulicity  Previously postprandial readings have been high and she was started on 0.5 mg Prandin with her main meals since 12/2018  However she sometimes will forget to take it before the meal and may remember after eating  She is however checking her blood sugars very sporadically and mostly before dinnertime; she has only had 10 readings in the last month  Fasting glucose was 202 today although she thinks this was from a stressful event during the night  However FASTING blood sugars appear to be mostly high  She has a couple of readings in the  mornings but not clear if these are before or after eating  Because of cold weather she has stopped walking  She stopped taking metformin because of GI side effects previously without obvious increase in her blood sugars  She does not see the dietitian because of cost        Side effects from medications have been: Metaglip caused abdominal pain, Tradjenta caused swelling of the legs, Bydureon caused skin nodules, Farxiga caused diarrhea, glipizide causes dizziness and weakness, Metformin caused GI side effects, Invokana causes yeast infections  Compliance with the medical regimen: Inconsistent  Glucose monitoring:  done less than 1 times a day         Glucometer: One Verio    Blood Glucose readings by time of day by monitor download:   PRE-MEAL  morning Lunch Dinner Bedtime Overall  Glucose range:  138-202  138, 154  85, 103    Mean/median:  163     130   Previous readings: Recent range 97-147 with most readings around 1 PM    Self-care:  Typical meal intake: Breakfast is Oatmeal, usually eating low-fat dinner and her snacks will be fruit, peanut butter crackers                Dietician visit, most recent: None  Weight history:  Wt Readings from Last 3 Encounters:  06/04/19 137 lb 3.2 oz (62.2 kg)  03/02/19 131 lb (59.4 kg)  12/22/18 127 lb 3.2 oz (57.7 kg)    Glycemic control:   Lab Results  Component Value Date   HGBA1C 5.8 (A) 06/04/2019   HGBA1C 5.3 12/20/2018   HGBA1C 5.5 07/04/2018   Lab Results  Component Value Date   MICROALBUR 21.6 (H) 12/20/2018   LDLCALC 143 (H) 12/20/2018   CREATININE 0.55 02/27/2019   Lab Results  Component Value Date   MICRALBCREAT 21.3 12/20/2018    Lab Results  Component Value Date   FRUCTOSAMINE 265 02/27/2019   FRUCTOSAMINE 271 09/13/2018   FRUCTOSAMINE 282 05/08/2018    Office Visit on 06/04/2019  Component Date Value Ref Range Status  . Hemoglobin A1C 06/04/2019 5.8* 4.0 - 5.6 % Final      Allergies as of 06/04/2019      Reactions   Fentanyl Anxiety   Made her feel very anxious, requests not to get it.   Levofloxacin Anxiety   Weird dreams    Prednisone    Anxiety and paranoia   Amoxicillin Diarrhea   Has patient had a PCN reaction causing immediate rash, facial/tongue/throat swelling, SOB or lightheadedness with hypotension: No Has patient had a PCN reaction causing severe rash involving mucus membranes or skin necrosis: No Has patient had a PCN reaction that required hospitalization: No Has patient had a PCN reaction occurring within the last 10 years: Yes If all of the above answers are "NO", then may proceed with Cephalosporin use.   Augmentin [amoxicillin-pot Clavulanate] Other (See Comments)   diarrhea   Farxiga [dapagliflozin] Diarrhea   Gi pain   Statins    Myalgia       Medication List       Accurate as of June 04, 2019  1:33 PM. If you have any questions, ask your nurse or doctor.        STOP taking these medications   Invokana 100 MG Tabs tablet Generic drug: canagliflozin Stopped by: Elayne Snare, MD   metFORMIN 500 MG tablet Commonly known as: GLUCOPHAGE Stopped by: Elayne Snare, MD   Trulicity 1.5 TM/5.4YT Sopn Generic drug: Dulaglutide Stopped by: Elayne Snare, MD     TAKE these medications   ALPRAZolam 0.5 MG tablet Commonly known as: XANAX Take 1 tablet (0.5 mg total) by mouth at bedtime as needed for anxiety.   aspirin 325 MG tablet Take 650-975 mg by mouth daily as needed for moderate pain or headache.   azelastine 0.05 % ophthalmic solution Commonly known as: OPTIVAR INSTILL 1 DROP IN AFFECTED EYE(S) DAILY   budesonide 3 MG 24 hr capsule Commonly known as: ENTOCORT EC TAKE 3 CAPSULES(9 MG) BY MOUTH DAILY   cyclobenzaprine 10 MG tablet Commonly known as: FLEXERIL take 1 tablet by mouth at bedtime if needed What changed:   how much to take  how to take this  when to take this  reasons to take  this  additional instructions   diphenhydrAMINE 25 mg capsule Commonly known as: BENADRYL Take 25-50 mg by mouth daily as needed for allergies.   diphenhydrAMINE-zinc acetate cream Commonly known as: BENADRYL Apply 1 application topically daily as needed for itching.   Effexor XR 75 MG 24 hr capsule Generic drug: venlafaxine XR TAKE 1 CAPSULE BY MOUTH TWICE DAILY WITH A MEAL   ferrous sulfate 325 (65 FE) MG tablet Take 325 mg by mouth 2 (two) times daily  with a meal.   Fluticasone-Salmeterol 100-50 MCG/DOSE Aepb Commonly known as: Advair Diskus Inhale 1 puff into the lungs 2 (two) times daily. What changed:   when to take this  reasons to take this   furosemide 20 MG tablet Commonly known as: LASIX Take 1 tablet (20 mg total) by mouth daily as needed.   glucose blood test strip Use Onetouch verio test strips as instructed to check blood sugar once daily.   Hair Skin and Nails Formula Tabs Take 1 tablet by mouth daily.   lansoprazole 30 MG capsule Commonly known as: PREVACID TAKE 1 CAPSULE(30 MG) BY MOUTH DAILY   LIVER PO Take 1 capsule by mouth 2 (two) times daily.   montelukast 10 MG tablet Commonly known as: SINGULAIR TAKE 1 TABLET BY MOUTH AT BEDTIME.   OneTouch Delica Plus [id number] Misc USE AS DIRECTED UP TO 4 TIMES DAILY AS DIRECTED   OneTouch Verio Flex System w/Device Kit Use Onetouch Verio Flex to check blood sugar once daily.   phenylephrine 10 MG Tabs tablet Commonly known as: SUDAFED PE Take 10 mg by mouth every 4 (four) hours as needed.   promethazine 12.5 MG tablet Commonly known as: PHENERGAN Take 1 tablet (12.5 mg total) by mouth every 8 (eight) hours as needed for nausea or vomiting.   pseudoephedrine 30 MG tablet Commonly known as: SUDAFED Take 30 mg by mouth daily as needed for congestion.   repaglinide 0.5 MG tablet Commonly known as: PRANDIN TAKE 1 TABLET(0.5 MG) BY MOUTH TWICE DAILY BEFORE A MEAL   sodium chloride inhaler  solution Commonly known as: BRONCHO SALINE Take 1 spray by nebulization as needed.       Allergies:  Allergies  Allergen Reactions  . Fentanyl Anxiety    Made her feel very anxious, requests not to get it.  . Levofloxacin Anxiety    Weird dreams   . Prednisone     Anxiety and paranoia  . Amoxicillin Diarrhea    Has patient had a PCN reaction causing immediate rash, facial/tongue/throat swelling, SOB or lightheadedness with hypotension: No Has patient had a PCN reaction causing severe rash involving mucus membranes or skin necrosis: No Has patient had a PCN reaction that required hospitalization: No Has patient had a PCN reaction occurring within the last 10 years: Yes If all of the above answers are "NO", then may proceed with Cephalosporin use.   . Augmentin [Amoxicillin-Pot Clavulanate] Other (See Comments)    diarrhea  . [place] [Dapagliflozin] Diarrhea    Gi pain  . Statins     Myalgia     Past Medical History:  Diagnosis Date  . Allergy   . Anxiety   . Asthma   . Autoimmune hepatitis ([place]) 08/18/2016   08/2016 liver bx confirms, fibrosis not cirrhosis  . Depression   . DM (diabetes mellitus) ([place])   . 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 GERD (gastroesophageal reflux disease)   . Hiatal hernia   . Hx of adenomatous polyp of colon 07/07/2009  . Hyperlipidemia   . Hypertension   . IBS (irritable bowel syndrome)   . Iron deficiency anemia   . 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 Sinusitis, chronic   . Thyroid nodule   . Umbilical hernia   . Uterine fibroid   . Vascular disease   . Vitamin D deficiency     Past Surgical History:  Procedure Laterality Date  . BREAST EXCISIONAL BIOPSY Left   .  BREAST EXCISIONAL BIOPSY Right   . CATARACT EXTRACTION Bilateral   . COLONOSCOPY  11/1991, 06/2009  . ESOPHAGOGASTRODUODENOSCOPY  02/2002  . ESOPHAGOGASTRODUODENOSCOPY (EGD) WITH PROPOFOL N/A 06/20/2017   Procedure:  ESOPHAGOGASTRODUODENOSCOPY (EGD) WITH PROPOFOL;  Surgeon: Gatha Mayer, MD;  Location: [facility];  Service: Endoscopy;  Laterality: N/A;  with Barrx  . EYE SURGERY    . NASAL SEPTOPLASTY W/ TURBINOPLASTY    . TONSILLECTOMY      Family History  Problem Relation Age of Onset  . Stroke Mother   . Hypertension Mother   . Colon cancer Father   . Liver cancer Father   . Heart disease Sister        hole in heart  . Stroke Maternal Grandmother   . Hypertension Maternal Grandmother   . Esophageal cancer Maternal Grandfather   . Breast cancer Paternal Grandmother   . Brain cancer Paternal Grandmother   . Lung cancer Paternal Grandfather   . Diabetes Neg Hx     Social History:  reports that she has never smoked. She has never used smokeless tobacco. She reports current alcohol use. She reports that she does not use drugs.   Review of Systems   Lipid history: She has pure hypercholesterolemia Highest baseline LDL about 170  Previously had tried Zocor which apparently caused muscle aches, she took Crestor and she thinks generic causes muscle cramps She took Livalo for a couple of days but she thinks she didn't feel well with this and had some palpitations reportedly  She was given Zetia  However her LDL is slightly higher than before, now done with direct LDL   Lab Results  Component Value Date   CHOL 219 (H) 12/20/2018   CHOL 248 (H) 05/08/2018   CHOL 246 (H) 02/22/2017   Lab Results  Component Value Date   HDL 56.30 12/20/2018   HDL 52.50 05/08/2018   HDL 50.50 02/22/2017   Lab Results  Component Value Date   LDLCALC 143 (H) 12/20/2018   LDLCALC 170 (H) 05/08/2018   LDLCALC 174 (H) 02/22/2017   Lab Results  Component Value Date   TRIG 101.0 12/20/2018   TRIG 128.0 05/08/2018   TRIG 108.0 02/22/2017   Lab Results  Component Value Date   CHOLHDL 4 12/20/2018   CHOLHDL 5 05/08/2018   CHOLHDL 5 02/22/2017   Lab Results  Component Value Date   LDLDIRECT  153.0 02/27/2019           Lab Results  Component Value Date   ALT 34 02/27/2019    Hypertension: None  She does take Lasix as needed because of tendency to swelling in her abdomen   BP Readings from Last 3 Encounters:  06/04/19 (!) 160/100  03/02/19 110/70  12/22/18 132/82     Most recent eye exam was 9/19  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 Hemoglobin A1C 06/04/2019 5.8* 4.0 - 5.6 % Final    Physical Examination:  BP (!) 160/100 (BP Location: Left Arm, Patient Position: Sitting, Cuff Size: Normal)   Pulse 93   Ht '5\' 6"'$  (1.676 m)   Wt 137 lb 3.2 oz (62.2 kg)  SpO2 98%   BMI 22.14 kg/m       ASSESSMENT:  Diabetes type 2, nonobese  See history of present illness for detailed discussion of current diabetes management, blood sugar patterns and problems identified  A1c has been usually lower than expected, now 5.8 compared to 5.3 Fructosamine has been previously high normal and pending from today  She has only 10 readings in the last month at home and very few readings after meals However she appears to have high fasting readings Recently she has stopped Invokana because of yeast infection but not clear if her sugars are increasing again Also she wants to stop Trulicity and she does not understand how this works  Discussed in detail the actions of various types of diabetes drugs, need for consistent control especially since she has readings as high as 202 now Is also not doing any exercise On and off may still have excess carbohydrates and sweets  She had questions about the Covid vaccine and this was discussed  Increased blood pressure: She  thinks this is from being stressed today but blood pressure was normal even at the end of the visit She needs to see her PCP and is overdue for visit anyway  PLAN:     Since she wants to switch to an oral medications she will stop Trulicity and start Rybelsus with a sample  Discussed with the patient the nature of GLP-1 drugs, the actions on insulin secretion, slowing stomach emptying, reduction of appetite and reduced liver glucose production Explained that Rybelsus improves blood sugar control as well as produces weight loss and reduces cardiovascular events. Explained possible side effects especially nausea and vomiting that may initially; usually side effects improved with time.  Patient to call if nausea or vomiting does not improve within 2 weeks Have explained the need to take the capsules on empty stomach 30 minutes before breakfast daily.  Patient education material given She will call after about 3 weeks to request a prescription for a 7 mg dose which will be the therapeutic level  She will continue to take Prandin but needs to try and take this consistently before any meal containing carbohydrate She needs to check blood sugars more regularly and especially fasting reading Discussed blood sugar targets at various times Encouraged her to start indoor exercise program since she cannot go outside to walk   Patient Instructions  Check blood sugars on waking up 2-3 days a week  Also check blood sugars about 2 hours after meals and do this after different meals by rotation  Recommended blood sugar levels on waking up are 90-130 and about 2 hours after meal is 130-160  Please bring your blood sugar monitor to each visit, thank you      Counseling time on subjects discussed in assessment and plan sections is over 50% of today's 25 minute visit   06/11/2019: Patient called to say she wants to continue Trulicity instead of switching to [place] 06/04/2019, 1:33 PM    Note: This office note was prepared with Dragon voice recognition system technology. Any transcriptional errors that result from this process are unintentional.

## 2019-06-05 LAB — FRUCTOSAMINE: Fructosamine: 284 umol/L (ref 0–285)

## 2019-06-11 ENCOUNTER — Other Ambulatory Visit: Payer: Self-pay

## 2019-06-11 ENCOUNTER — Telehealth (INDEPENDENT_AMBULATORY_CARE_PROVIDER_SITE_OTHER): Payer: 59 | Admitting: Family Medicine

## 2019-06-11 ENCOUNTER — Telehealth: Payer: Self-pay

## 2019-06-11 ENCOUNTER — Other Ambulatory Visit: Payer: Self-pay | Admitting: Family Medicine

## 2019-06-11 DIAGNOSIS — J4520 Mild intermittent asthma, uncomplicated: Secondary | ICD-10-CM

## 2019-06-11 DIAGNOSIS — G4700 Insomnia, unspecified: Secondary | ICD-10-CM

## 2019-06-11 DIAGNOSIS — I10 Essential (primary) hypertension: Secondary | ICD-10-CM

## 2019-06-11 DIAGNOSIS — R52 Pain, unspecified: Secondary | ICD-10-CM

## 2019-06-11 DIAGNOSIS — Z760 Encounter for issue of repeat prescription: Secondary | ICD-10-CM | POA: Diagnosis not present

## 2019-06-11 DIAGNOSIS — F411 Generalized anxiety disorder: Secondary | ICD-10-CM

## 2019-06-11 DIAGNOSIS — E1165 Type 2 diabetes mellitus with hyperglycemia: Secondary | ICD-10-CM | POA: Diagnosis not present

## 2019-06-11 DIAGNOSIS — J45909 Unspecified asthma, uncomplicated: Secondary | ICD-10-CM | POA: Insufficient documentation

## 2019-06-11 DIAGNOSIS — J40 Bronchitis, not specified as acute or chronic: Secondary | ICD-10-CM

## 2019-06-11 MED ORDER — MONTELUKAST SODIUM 10 MG PO TABS: ORAL_TABLET | ORAL | 1 refills | Status: DC

## 2019-06-11 MED ORDER — ALPRAZOLAM 0.5 MG PO TABS: 0.5000 mg | ORAL_TABLET | Freq: Every evening | ORAL | 5 refills | Status: DC | PRN

## 2019-06-11 MED ORDER — TRULICITY 1.5 MG/0.5ML ~~LOC~~ SOAJ: 1.5000 mg | SUBCUTANEOUS | 0 refills | Status: DC

## 2019-06-11 MED ORDER — CYCLOBENZAPRINE HCL 10 MG PO TABS: 10.0000 mg | ORAL_TABLET | Freq: Three times a day (TID) | ORAL | 5 refills | Status: DC | PRN

## 2019-06-11 MED ORDER — FLUTICASONE-SALMETEROL 100-50 MCG/DOSE IN AEPB: 1.0000 | INHALATION_SPRAY | Freq: Two times a day (BID) | RESPIRATORY_TRACT | 11 refills | Status: DC

## 2019-06-11 MED ORDER — BUDESONIDE 3 MG PO CPEP: 6.0000 mg | ORAL_CAPSULE | Freq: Every day | ORAL | 2 refills | Status: DC

## 2019-06-11 NOTE — Telephone Encounter (Signed)
Patient called in wanting to let Dr know that she decided to stay on Trulicity injection instead of the medication you gave her samples to try out    Please call and advise

## 2019-06-11 NOTE — Telephone Encounter (Signed)
FYI, see message. 

## 2019-06-11 NOTE — Progress Notes (Signed)
Virtual Visit via Telephone Note  I connected with the patient on 06/11/19 at  3:15 PM EST by telephone and verified that I am speaking with the correct person using two identifiers.   I discussed the limitations, risks, security and privacy concerns of performing an evaluation and management service by telephone and the availability of in person appointments. I also discussed with the patient that there may be a patient responsible charge related to this service. The patient expressed understanding and agreed to proceed.  Location patient: home Location provider: work or home office Participants present for the call: patient, provider Patient did not have a visit in the prior 7 days to address this/these issue(s).   History of Present Illness: Here for medication refills and to ask about her BP. She has switched to [place] for primary care since the retirement of Dr. Theda Belfast. She feels well in general. She takes Xanax at bedtime for sleep, and she needs a refill. She uses Advair discus for mild asthma flares and she needs a refill. She also takes Flexeril as needed for muscle spasms, ,and she needs a refill. She sees Dr. Dwyane Dee for type 2 diabetes, and this has been very well controlled in the past few months. He last A1c a week ago was excellent at 5.8. She has a hx of HTN but she has never taken a medication to treat this. I see in her chart her BP has been quite stable, most recently in October. However when she last saw Dr. Dwyane Dee on 06-04-19 this was up to 160/100 and her pulse was 93. She was told that she needed to follow up with [place] asap to treat this. However she notes that she was a few minutes late to that appointment, that she rushed to the office, that her BP was checked immediately rather than after she had sat for a few minutes, and that she needed to use the bathroom that day and she was not allowed to do so. This made her upset. She believes this BP was not accurate.     Observations/Objective: Patient sounds cheerful and well on the phone. I do not appreciate any SOB. Speech and thought processing are grossly intact. Patient reported vitals:  Assessment and Plan: As far as the BP, I agree with her that this was likely not a true reading. I advised her to purchase a BP cuff so she can follow this at home. She will check the BP twice a day for one week and then report back to [place]. As for the insomnia, the Xanax was refilled. For the muscle spasms, the Flexeril was refilled. For the asthma, the Advair was refilled.  Alysia Penna, MD   Follow Up Instructions:     [phone] 5-10 [phone] 11-20 [id number] 21-30 I did not refer this patient for an OV in the next 24 hours for this/these issue(s).  I discussed the assessment and treatment plan with the patient. The patient was provided an opportunity to ask questions and all were answered. The patient agreed with the plan and demonstrated an understanding of the instructions.   The patient was advised to call back or seek an in-person evaluation if the symptoms worsen or if the condition fails to improve as anticipated.  I provided 19 minutes of non-face-to-face time during this encounter.   Alysia Penna, MD

## 2019-06-11 NOTE — Telephone Encounter (Signed)
Noted  

## 2019-06-11 NOTE — Telephone Encounter (Signed)
RX REFILL montelukast (SINGULAIR) 10 MG tablet  PHARMACY [facility] [id number] [address], [address] AT [address] Phone:  [phone]  Fax:  [phone]

## 2019-06-11 NOTE — Addendum Note (Signed)
Addended by: Alysia Penna A on: 06/11/2019 04:45 PM   Modules accepted: Orders

## 2019-06-11 NOTE — Telephone Encounter (Signed)
Rx sent 

## 2019-06-19 ENCOUNTER — Encounter: Payer: Self-pay | Admitting: Internal Medicine

## 2019-06-22 ENCOUNTER — Other Ambulatory Visit: Payer: Self-pay | Admitting: Endocrinology

## 2019-07-04 ENCOUNTER — Other Ambulatory Visit: Payer: Self-pay | Admitting: Endocrinology

## 2019-07-04 NOTE — Telephone Encounter (Signed)
Please clarify if you want pt to continue taking Zetia. It is listed as D/C'd and last office visit mentioned that medication was not effective.

## 2019-07-04 NOTE — Telephone Encounter (Signed)
Okay to continue?

## 2019-07-21 ENCOUNTER — Other Ambulatory Visit: Payer: Self-pay | Admitting: Internal Medicine

## 2019-07-21 DIAGNOSIS — K219 Gastro-esophageal reflux disease without esophagitis: Secondary | ICD-10-CM

## 2019-07-25 ENCOUNTER — Encounter: Payer: Self-pay | Admitting: Family Medicine

## 2019-07-25 ENCOUNTER — Telehealth: Payer: Self-pay | Admitting: Internal Medicine

## 2019-07-25 NOTE — Telephone Encounter (Signed)
It looks like she will have routine labs in [facility] after our visit so tell her we can wait until those are done   So no labs by [place]

## 2019-07-25 NOTE — Telephone Encounter (Signed)
Dr. Gessner, please advise.  

## 2019-07-25 NOTE — Telephone Encounter (Signed)
Pt just made an appt with Dr. Carlean Purl for 3/9. She wants to know if she needs labs before her appt. Pls call her.

## 2019-07-26 NOTE — Telephone Encounter (Signed)
Patient notified

## 2019-07-27 NOTE — Telephone Encounter (Signed)
Excellent

## 2019-07-30 ENCOUNTER — Other Ambulatory Visit: Payer: Self-pay

## 2019-07-30 ENCOUNTER — Telehealth (INDEPENDENT_AMBULATORY_CARE_PROVIDER_SITE_OTHER): Payer: 59 | Admitting: Family Medicine

## 2019-07-30 DIAGNOSIS — L989 Disorder of the skin and subcutaneous tissue, unspecified: Secondary | ICD-10-CM | POA: Diagnosis not present

## 2019-07-30 NOTE — Progress Notes (Signed)
Virtual Visit via Video Note  I connected with the patient on 07/30/19 at  3:30 PM EST by a video enabled telemedicine application and verified that I am speaking with the correct person using two identifiers.  Location patient: home Location provider:work or home office Persons participating in the virtual visit: patient, provider  I discussed the limitations of evaluation and management by telemedicine and the availability of in person appointments. The patient expressed understanding and agreed to proceed.   HPI: Here to check a spot on her right breast. She has had a small scaly lesion here for years, but about 2 weeks ago it became red and irritated and even drained some clear fluid. It was mildly sore but not now. She has been treating it with Neosporin, and ithas improved quite a bit. The drainage has stopped, and the red area is much smaller.    ROS: See pertinent positives and negatives per HPI.  Past Medical History:  Diagnosis Date  . Allergy   . Anxiety   . Asthma   . Autoimmune hepatitis ([place]) 08/18/2016   08/2016 liver bx confirms, fibrosis not cirrhosis  . Depression   . DM (diabetes mellitus) ([place])   . 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 GERD (gastroesophageal reflux disease)   . Hiatal hernia   . Hx of adenomatous polyp of colon 07/07/2009  . Hyperlipidemia   . Hypertension   . IBS (irritable bowel syndrome)   . Iron deficiency anemia   . 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 Sinusitis, chronic   . Thyroid nodule   . Umbilical hernia   . Uterine fibroid   . Vascular disease   . Vitamin D deficiency     Past Surgical History:  Procedure Laterality Date  . BREAST EXCISIONAL BIOPSY Left   . BREAST EXCISIONAL BIOPSY Right   . CATARACT EXTRACTION Bilateral   . COLONOSCOPY  11/1991, 06/2009  . ESOPHAGOGASTRODUODENOSCOPY  02/2002  . ESOPHAGOGASTRODUODENOSCOPY (EGD) WITH PROPOFOL N/A 06/20/2017   Procedure:  ESOPHAGOGASTRODUODENOSCOPY (EGD) WITH PROPOFOL;  Surgeon: Gatha Mayer, MD;  Location: [facility];  Service: Endoscopy;  Laterality: N/A;  with Barrx  . EYE SURGERY    . NASAL SEPTOPLASTY W/ TURBINOPLASTY    . TONSILLECTOMY      Family History  Problem Relation Age of Onset  . Stroke Mother   . Hypertension Mother   . Colon cancer Father   . Liver cancer Father   . Heart disease Sister        hole in heart  . Stroke Maternal Grandmother   . Hypertension Maternal Grandmother   . Esophageal cancer Maternal Grandfather   . Breast cancer Paternal Grandmother   . Brain cancer Paternal Grandmother   . Lung cancer Paternal Grandfather   . Diabetes Neg Hx      Current Outpatient Medications:  .  ALPRAZolam (XANAX) 0.5 MG tablet, Take 1 tablet (0.5 mg total) by mouth at bedtime as needed for sleep., Disp: 30 tablet, Rfl: 5 .  aspirin 325 MG tablet, Take 650-975 mg by mouth daily as needed for moderate pain or headache. , Disp: , Rfl:  .  azelastine (OPTIVAR) 0.05 % ophthalmic solution, INSTILL 1 DROP IN AFFECTED EYE(S) DAILY, Disp: 6 mL, Rfl: 0 .  Blood Glucose Monitoring Suppl (ONETOUCH VERIO FLEX SYSTEM) w/Device KIT, Use Onetouch Verio Flex to check blood sugar once daily., Disp: 1 kit, Rfl: 0 .  budesonide (ENTOCORT EC) 3 MG  24 hr capsule, Take 2 capsules (6 mg total) by mouth daily., Disp: 90 capsule, Rfl: 2 .  cyclobenzaprine (FLEXERIL) 10 MG tablet, Take 1 tablet (10 mg total) by mouth every 8 (eight) hours as needed for muscle spasms. take 1 tablet by mouth at bedtime if needed, Disp: 60 tablet, Rfl: 5 .  diphenhydrAMINE (BENADRYL) 25 mg capsule, Take 25-50 mg by mouth daily as needed for allergies. , Disp: , Rfl:  .  diphenhydrAMINE-zinc acetate (BENADRYL) cream, Apply 1 application topically daily as needed for itching., Disp: , Rfl:  .  Dulaglutide (TRULICITY) 1.5 NO/7.0JG SOPN, Inject 1.5 mg into the skin once a week., Disp: 1 pen, Rfl: 0 .  EFFEXOR XR 75 MG 24 hr capsule,  TAKE 1 CAPSULE BY MOUTH TWICE DAILY WITH A MEAL, Disp: 60 capsule, Rfl: 5 .  ezetimibe (ZETIA) 10 MG tablet, TAKE 1 TABLET(10 MG) BY MOUTH DAILY, Disp: 30 tablet, Rfl: 3 .  ferrous sulfate 325 (65 FE) MG tablet, Take 325 mg by mouth 2 (two) times daily with a meal., Disp: , Rfl:  .  Fluticasone-Salmeterol (ADVAIR DISKUS) 100-50 MCG/DOSE AEPB, Inhale 1 puff into the lungs 2 (two) times daily., Disp: 60 each, Rfl: 11 .  furosemide (LASIX) 20 MG tablet, Take 1 tablet (20 mg total) by mouth daily as needed., Disp: 30 tablet, Rfl: 5 .  glucose blood test strip, Use Onetouch verio test strips as instructed to check blood sugar once daily., Disp: 100 each, Rfl: 3 .  Lancets (ONETOUCH DELICA PLUS [id number]) MISC, USE AS DIRECTED UP TO 4 TIMES DAILY AS DIRECTED, Disp: 100 each, Rfl: 0 .  lansoprazole (PREVACID) 30 MG capsule, TAKE 1 CAPSULE(30 MG) BY MOUTH DAILY, Disp: 90 capsule, Rfl: 1 .  montelukast (SINGULAIR) 10 MG tablet, TAKE 1 TABLET BY MOUTH AT BEDTIME., Disp: 90 tablet, Rfl: 1 .  Multiple Vitamins-Minerals (HAIR SKIN AND NAILS FORMULA) TABS, Take 1 tablet by mouth daily., Disp: , Rfl:  .  phenylephrine (SUDAFED PE) 10 MG TABS tablet, Take 10 mg by mouth every 4 (four) hours as needed., Disp: , Rfl:  .  promethazine (PHENERGAN) 12.5 MG tablet, Take 1 tablet (12.5 mg total) by mouth every 8 (eight) hours as needed for nausea or vomiting., Disp: 20 tablet, Rfl: 1 .  pseudoephedrine (SUDAFED) 30 MG tablet, Take 30 mg by mouth daily as needed for congestion. , Disp: , Rfl:  .  repaglinide (PRANDIN) 0.5 MG tablet, TAKE 1 TABLET(0.5 MG) BY MOUTH TWICE DAILY BEFORE A MEAL, Disp: 60 tablet, Rfl: 2 .  sodium chloride (BRONCHO SALINE) inhaler solution, Take 1 spray by nebulization as needed., Disp: , Rfl:   EXAM: SKIN: the right upper breast has an area of redness and scaling that appears to be about 1 cm in diameter.  VITALS per patient if applicable:  GENERAL: alert, oriented, appears well and in no  acute distress  HEENT: atraumatic, conjunttiva clear, no obvious abnormalities on inspection of external nose and ears  NECK: normal movements of the head and neck  LUNGS: on inspection no signs of respiratory distress, breathing rate appears normal, no obvious gross SOB, gasping or wheezing  CV: no obvious cyanosis  MS: moves all visible extremities without noticeable abnormality  PSYCH/NEURO: pleasant and cooperative, no obvious depression or anxiety, speech and thought processing grossly intact  ASSESSMENT AND PLAN: Skin lesion that is difficult to evaluate on the camera. This is likely an inflamed seborrheic keratosis, but we will have her make an in person  OV so we can examine this more closely. We also plan to do a complete well exam at that time.  Alysia Penna, MD  Discussed the following assessment and plan:  No diagnosis found.     I discussed the assessment and treatment plan with the patient. The patient was provided an opportunity to ask questions and all were answered. The patient agreed with the plan and demonstrated an understanding of the instructions.   The patient was advised to call back or seek an in-person evaluation if the symptoms worsen or if the condition fails to improve as anticipated.

## 2019-08-01 ENCOUNTER — Ambulatory Visit (INDEPENDENT_AMBULATORY_CARE_PROVIDER_SITE_OTHER): Payer: 59 | Admitting: Family Medicine

## 2019-08-01 ENCOUNTER — Encounter: Payer: Self-pay | Admitting: Family Medicine

## 2019-08-01 ENCOUNTER — Other Ambulatory Visit: Payer: Self-pay

## 2019-08-01 ENCOUNTER — Other Ambulatory Visit (HOSPITAL_COMMUNITY)
Admission: RE | Admit: 2019-08-01 | Discharge: 2019-08-01 | Disposition: A | Discharge status: Home / Self Care | Payer: 59 | Source: Ambulatory Visit | Attending: Family Medicine | Admitting: Family Medicine

## 2019-08-01 VITALS — BP 130/80 | HR 89 | Temp 98.6°F | Ht 66.0 in | Wt 141.6 lb

## 2019-08-01 DIAGNOSIS — Z124 Encounter for screening for malignant neoplasm of cervix: Secondary | ICD-10-CM | POA: Diagnosis not present

## 2019-08-01 DIAGNOSIS — Z0000 Encounter for general adult medical examination without abnormal findings: Secondary | ICD-10-CM | POA: Diagnosis not present

## 2019-08-01 DIAGNOSIS — N72 Inflammatory disease of cervix uteri: Secondary | ICD-10-CM

## 2019-08-01 DIAGNOSIS — L989 Disorder of the skin and subcutaneous tissue, unspecified: Secondary | ICD-10-CM

## 2019-08-01 DIAGNOSIS — B977 Papillomavirus as the cause of diseases classified elsewhere: Secondary | ICD-10-CM

## 2019-08-01 LAB — URINALYSIS, ROUTINE W REFLEX MICROSCOPIC
Bilirubin Urine: NEGATIVE
Hgb urine dipstick: NEGATIVE
Ketones, ur: NEGATIVE
Leukocytes,Ua: NEGATIVE
Nitrite: NEGATIVE
RBC / HPF: NONE SEEN (ref 0–?)
Specific Gravity, Urine: 1.025 (ref 1.000–1.030)
Total Protein, Urine: 30 — AB
Urine Glucose: NEGATIVE
Urobilinogen, UA: 1 (ref 0.0–1.0)
pH: 6 (ref 5.0–8.0)

## 2019-08-01 LAB — LIPID PANEL
Cholesterol: 236 mg/dL — ABNORMAL HIGH (ref 0–200)
HDL: 59.4 mg/dL (ref >39.00)
LDL Cholesterol: 156 mg/dL — ABNORMAL HIGH (ref 0–99)
NonHDL: 176.73
Total CHOL/HDL Ratio: 4
Triglycerides: 104 mg/dL (ref 0.0–149.0)
VLDL: 20.8 mg/dL (ref 0.0–40.0)

## 2019-08-01 NOTE — Progress Notes (Signed)
Subjective:    [name], female    DOB: 12/19/1950, 69 y.o.   MRN: [id number]  HPI Here for a well exam. She feels well in general. She does want me to check some spots on her chest that are changing. She has had some small red scaly areas for years, but the area above the right breast has been enlarging recently. As we discussed at our virtual visit it had also become irritated and was draining, but then it dried up. It is not painful. She gets yearly mammograms and yearly eye exams. She sees Dr. Carlean Purl for autoimmune hepatitis and she sees Dr. Dwyane Dee for diabetes. Her last A1c in December was 5.8. She has chronically elevated lipids but of course with her liver disease she cannot take statins. She is on Zetia and she watches a strict diet. Her last LDL in September was 153.    Review of Systems  Constitutional: Negative.  Negative for activity change, appetite change, diaphoresis, fatigue, fever and unexpected weight change.  HENT: Negative.  Negative for congestion, ear pain, hearing loss, nosebleeds, sore throat, tinnitus, trouble swallowing and voice change.   Eyes: Negative.  Negative for photophobia, pain, discharge, redness and visual disturbance.  Respiratory: Negative.  Negative for apnea, cough, choking, chest tightness, shortness of breath, wheezing and stridor.   Cardiovascular: Negative.  Negative for chest pain, palpitations and leg swelling.  Gastrointestinal: Negative.  Negative for abdominal distention, abdominal pain, blood in stool, constipation, diarrhea, nausea, rectal pain and vomiting.  Genitourinary: Negative.  Negative for difficulty urinating, dysuria, enuresis, flank pain, frequency, hematuria, menstrual problem, urgency, vaginal bleeding, vaginal discharge and vaginal pain.  Musculoskeletal: Negative.  Negative for arthralgias, back pain, gait problem, joint swelling, myalgias, neck pain and neck stiffness.  Skin: Negative.  Negative for color  change, pallor, rash and wound.  Neurological: Negative.  Negative for dizziness, tremors, seizures, syncope, speech difficulty, weakness, light-headedness, numbness and headaches.  Hematological: Negative for adenopathy. Does not bruise/bleed easily.  Psychiatric/Behavioral: Negative.  Negative for agitation, behavioral problems, confusion, dysphoric mood, hallucinations and sleep disturbance. The patient is not nervous/anxious.        Objective:   Physical Exam Constitutional:      General: She is not in acute distress.    Appearance: Normal appearance. She is well-developed. She is not diaphoretic.  HENT:     Head: Normocephalic and atraumatic.     Right Ear: External ear normal.     Left Ear: External ear normal.     Nose: Nose normal.     Mouth/Throat:     Pharynx: No oropharyngeal exudate.  Eyes:     General: No scleral icterus.       Right eye: No discharge.        Left eye: No discharge.     Conjunctiva/sclera: Conjunctivae normal.     Pupils: Pupils are equal, round, and reactive to light.  Neck:     Thyroid: No thyromegaly.     Vascular: No JVD.  Cardiovascular:     Rate and Rhythm: Normal rate and regular rhythm.     Heart sounds: Normal heart sounds. No murmur. No friction rub. No gallop.   Pulmonary:     Effort: Pulmonary effort is normal. No respiratory distress.     Breath sounds: Normal breath sounds. No stridor. No wheezing or rales.  Chest:     Chest wall: No tenderness.  Abdominal:     General: Bowel sounds are normal. There  is no distension or abdominal bruit.     Palpations: Abdomen is soft. Abdomen is not rigid. There is no mass.     Tenderness: There is no abdominal tenderness. There is no guarding or rebound.     Hernia: No hernia is present.  Genitourinary:    General: Normal vulva.     Vagina: Normal. No vaginal discharge, erythema, tenderness or bleeding.     Cervix: No cervical motion tenderness, discharge or friability.     Adnexa:         Right: No mass, tenderness or fullness.         Left: No mass, tenderness or fullness.       Rectum: Normal.     Comments: A Pap smear was obtained. Both breasts were normal on exam  Musculoskeletal:        General: No tenderness. Normal range of motion.     Cervical back: Normal range of motion and neck supple.  Lymphadenopathy:     Cervical: No cervical adenopathy.  Skin:    General: Skin is warm and dry.     Coloration: Skin is not pale.     Comments: There are scattered areas of macular erythema and scaling across the upper chest  Neurological:     Mental Status: She is alert.     Cranial Nerves: No cranial nerve deficit.     Motor: No abnormal muscle tone.     Coordination: Coordination normal.     Deep Tendon Reflexes: Reflexes are normal and symmetric.  Psychiatric:        Behavior: Behavior normal.        Thought Content: Thought content normal.        Judgment: Judgment normal.           Assessment & Plan:  Well exam. We discussed diet and exercise. Get fasting lipids and a UA today. 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 I recommended she get a flu shot, pneumonia vaccines, and the Covid vaccine but she refused all these saying "I don't believe in vaccines".  Alysia Penna, MD

## 2019-08-03 LAB — CYTOLOGY - PAP
Comment: NEGATIVE
Comment: NEGATIVE
Diagnosis: NEGATIVE
Diagnosis: REACTIVE
HPV 16: NEGATIVE
HPV 18 / 45: NEGATIVE
High risk HPV: POSITIVE — AB

## 2019-08-03 NOTE — Addendum Note (Signed)
Addended by: Alysia Penna A on: 08/03/2019 04:39 PM   Modules accepted: Orders

## 2019-08-13 ENCOUNTER — Other Ambulatory Visit: Payer: Self-pay | Admitting: Endocrinology

## 2019-08-24 ENCOUNTER — Ambulatory Visit: Payer: 59 | Admitting: Internal Medicine

## 2019-08-24 ENCOUNTER — Other Ambulatory Visit: Payer: Self-pay

## 2019-08-24 ENCOUNTER — Encounter: Payer: Self-pay | Admitting: Internal Medicine

## 2019-08-24 VITALS — BP 162/98 | HR 84 | Temp 98.2°F | Ht 65.0 in | Wt 139.4 lb

## 2019-08-24 DIAGNOSIS — D500 Iron deficiency anemia secondary to blood loss (chronic): Secondary | ICD-10-CM

## 2019-08-24 DIAGNOSIS — Z800 Family history of malignant neoplasm of digestive organs: Secondary | ICD-10-CM | POA: Diagnosis not present

## 2019-08-24 DIAGNOSIS — Z86010 Personal history of colonic polyps: Secondary | ICD-10-CM

## 2019-08-24 DIAGNOSIS — K754 Autoimmune hepatitis: Secondary | ICD-10-CM

## 2019-08-24 DIAGNOSIS — Z7952 Long term (current) use of systemic steroids: Secondary | ICD-10-CM

## 2019-08-24 NOTE — Patient Instructions (Signed)
We are going to put you in the system for a August 2021 colonoscopy recall.   Please call us when you get your COVID vaccine and we can get you set up for a pre-visit and colonoscopy.   I appreciate the opportunity to care for you. Silvano Rusk, MD, [facility]

## 2019-08-24 NOTE — Progress Notes (Signed)
NICA FRISKE 69 y.o. [birth year] [id number]  Assessment & Plan:   Encounter Diagnoses  Name Primary?  . Autoimmune hepatitis treated with steroids ([place]) Yes  . Family history of colon cancer in father dx 27 and died 82 w/ second cancer   . 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 Hx of adenomatous polyp of colon   . Long term (current) use of systemic steroids-budesonide    Continue budesonide 6 mg daily.  She has labs coming up and I will recheck those.  She is aware that I have thought that immunomodulators therapy is better but has declined.  Discussed DEXA scan when she returns for colonoscopy later this year.  She wants to avoid getting tested for Covid and plans to get the [employer] vaccine.  She will call back to schedule her colonoscopy understanding that she can have a colonoscopy without testing for Covid 2 weeks after her vaccination.  August recall place as a backup as well.  Continue iron supplementation.  Follow-up CBC and ferritin.  We will ask Dr. Dwyane Dee to draw if not already ordered  Next follow-up will be at colonoscopy  Subjective:   Chief Complaint: Autoimmune hepatitis, iron deficiency anemia  HPI Mannie is here for follow-up of her autoimmune hepatitis treated budesonide and [facility] which has caused iron deficiency anemia.  She has had minimal transaminase elevations.  She has declined immunomodulators therapy and prefers to take budesonide.  She understands the possible side effects.  Diabetes is under much better control with changes in therapy she reports.  Due to have labs next week.  I had sent her a letter regarding colonoscopy being able to wait 2 more years but at the time I did not see that she had a significant family history of colon cancer in her father under the age of 53.  She had a diminutive adenoma a little over 5 years ago. Allergies  Allergen Reactions  . Fentanyl Anxiety    Made her feel very anxious, requests  not to get it.  . Levofloxacin Anxiety    Weird dreams   . Prednisone     Anxiety and paranoia  . Amoxicillin Diarrhea    Has patient had a PCN reaction causing immediate rash, facial/tongue/throat swelling, SOB or lightheadedness with hypotension: No Has patient had a PCN reaction causing severe rash involving mucus membranes or skin necrosis: No Has patient had a PCN reaction that required hospitalization: No Has patient had a PCN reaction occurring within the last 10 years: Yes If all of the above answers are "NO", then may proceed with Cephalosporin use.   . Augmentin [Amoxicillin-Pot Clavulanate] Other (See Comments)    diarrhea  . Wilder Glade [Dapagliflozin] Diarrhea    Gi pain  . Statins     Myalgia    Current Meds  Medication Sig  . ALPRAZolam (XANAX) 0.5 MG tablet Take 1 tablet (0.5 mg total) by mouth at bedtime as needed for sleep.  Marland Kitchen aspirin 325 MG tablet Take 650-975 mg by mouth daily as needed for moderate pain or headache.   Marland Kitchen azelastine (OPTIVAR) 0.05 % ophthalmic solution INSTILL 1 DROP IN AFFECTED EYE(S) DAILY  . Blood Glucose Monitoring Suppl (ONETOUCH VERIO FLEX SYSTEM) w/Device KIT Use Onetouch Verio Flex to check blood sugar once daily.  . budesonide (ENTOCORT EC) 3 MG 24 hr capsule Take 2 capsules (6 mg total) by mouth daily.  . cyclobenzaprine (FLEXERIL) 10 MG tablet Take 1  tablet (10 mg total) by mouth every 8 (eight) hours as needed for muscle spasms. take 1 tablet by mouth at bedtime if needed  . diphenhydrAMINE (BENADRYL) 25 mg capsule Take 25-50 mg by mouth daily as needed for allergies.   . diphenhydrAMINE-zinc acetate (BENADRYL) cream Apply 1 application topically daily as needed for itching.  . EFFEXOR XR 75 MG 24 hr capsule TAKE 1 CAPSULE BY MOUTH TWICE DAILY WITH A MEAL  . ezetimibe (ZETIA) 10 MG tablet TAKE 1 TABLET(10 MG) BY MOUTH DAILY  . ferrous gluconate (FERGON) 324 MG tablet Take 324 mg by mouth daily.  . Fluticasone-Salmeterol (ADVAIR DISKUS)  100-50 MCG/DOSE AEPB Inhale 1 puff into the lungs 2 (two) times daily.  . furosemide (LASIX) 20 MG tablet Take 1 tablet (20 mg total) by mouth daily as needed.  Marland Kitchen glucose blood test strip Use Onetouch verio test strips as instructed to check blood sugar once daily.  . Lancets (ONETOUCH DELICA PLUS [id number]) MISC USE AS DIRECTED UP TO 4 TIMES DAILY AS DIRECTED  . lansoprazole (PREVACID) 30 MG capsule TAKE 1 CAPSULE(30 MG) BY MOUTH DAILY  . montelukast (SINGULAIR) 10 MG tablet TAKE 1 TABLET BY MOUTH AT BEDTIME.  . Multiple Vitamins-Minerals (HAIR SKIN AND NAILS FORMULA) TABS Take 1 tablet by mouth daily.  . phenylephrine (SUDAFED PE) 10 MG TABS tablet Take 10 mg by mouth every 4 (four) hours as needed.  . promethazine (PHENERGAN) 12.5 MG tablet Take 1 tablet (12.5 mg total) by mouth every 8 (eight) hours as needed for nausea or vomiting.  . pseudoephedrine (SUDAFED) 30 MG tablet Take 30 mg by mouth daily as needed for congestion.   . repaglinide (PRANDIN) 0.5 MG tablet TAKE 1 TABLET(0.5 MG) BY MOUTH TWICE DAILY BEFORE A MEAL  . sodium chloride (BRONCHO SALINE) inhaler solution Take 1 spray by nebulization as needed.  . TRULICITY 1.5 SH/7.68YO SOPN ADMINISTER 0.5 ML UNDER THE SKIN 1 TIME WEEKLY AS DIRECTED  . [DISCONTINUED] ferrous sulfate 325 (65 FE) MG tablet Take 325 mg by mouth 2 (two) times daily with a meal.   Past Medical History:  Diagnosis Date  . Allergy   . Anxiety   . Asthma   . Autoimmune hepatitis ([place]) 08/18/2016   08/2016 liver bx confirms, fibrosis not cirrhosis  . Depression   . DM (diabetes mellitus) ([place])   . 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 GERD (gastroesophageal reflux disease)   . Hiatal hernia   . Hx of adenomatous polyp of colon 07/07/2009  . Hyperlipidemia   . Hypertension   . IBS (irritable bowel syndrome)   . Iron deficiency anemia   . 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 Sinusitis, chronic   . Thyroid  nodule   . Umbilical hernia   . Uterine fibroid   . Vascular disease   . Vitamin D deficiency    Past Surgical History:  Procedure Laterality Date  . BREAST EXCISIONAL BIOPSY Left   . BREAST EXCISIONAL BIOPSY Right   . CATARACT EXTRACTION Bilateral   . COLONOSCOPY  05/23/2014   per Dr. Carlean Purl, adenimatous polyps, repeat in 7 yrs   . ESOPHAGOGASTRODUODENOSCOPY  02/2002  . ESOPHAGOGASTRODUODENOSCOPY (EGD) WITH PROPOFOL N/A 06/20/2017   Procedure: ESOPHAGOGASTRODUODENOSCOPY (EGD) WITH PROPOFOL;  Surgeon: Gatha Mayer, MD;  Location: [facility];  Service: Endoscopy;  Laterality: N/A;  with Barrx  . EYE SURGERY    . NASAL SEPTOPLASTY W/ TURBINOPLASTY    . 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   Review of Systems As per HPI  Objective:   Physical Exam BP (!) 162/98 (BP Location: Left Arm, Patient Position: Sitting, Cuff Size: Normal)   Pulse 84   Temp 98.2 F (36.8 C)   Ht '5\' 5"'  (1.651 m) Comment: height measured without shoes  Wt 139 lb 6 oz (63.2 kg)   BMI 23.19 kg/m  No acute distress Eyes anicteric Normal heart sounds Lungs are clear Abdomen is soft and nontender without organomegaly or mass no stigmata of chronic liver disease noted in exam

## 2019-08-25 ENCOUNTER — Emergency Department (HOSPITAL_COMMUNITY)
Admission: EM | Admit: 2019-08-25 | Discharge: 2019-08-25 | Disposition: A | Discharge status: Home / Self Care | Payer: 59 | Attending: Emergency Medicine | Admitting: Emergency Medicine

## 2019-08-25 ENCOUNTER — Emergency Department (HOSPITAL_COMMUNITY): Payer: 59

## 2019-08-25 ENCOUNTER — Other Ambulatory Visit: Payer: Self-pay

## 2019-08-25 DIAGNOSIS — Y999 Unspecified external cause status: Secondary | ICD-10-CM | POA: Diagnosis not present

## 2019-08-25 DIAGNOSIS — Y929 Unspecified place or not applicable: Secondary | ICD-10-CM | POA: Diagnosis not present

## 2019-08-25 DIAGNOSIS — Y9389 Activity, other specified: Secondary | ICD-10-CM | POA: Insufficient documentation

## 2019-08-25 DIAGNOSIS — E119 Type 2 diabetes mellitus without complications: Secondary | ICD-10-CM | POA: Insufficient documentation

## 2019-08-25 DIAGNOSIS — S42254A Nondisplaced fracture of greater tuberosity of right humerus, initial encounter for closed fracture: Secondary | ICD-10-CM | POA: Diagnosis not present

## 2019-08-25 DIAGNOSIS — Z7984 Long term (current) use of oral hypoglycemic drugs: Secondary | ICD-10-CM | POA: Insufficient documentation

## 2019-08-25 DIAGNOSIS — W010XXA Fall on same level from slipping, tripping and stumbling without subsequent striking against object, initial encounter: Secondary | ICD-10-CM | POA: Diagnosis not present

## 2019-08-25 DIAGNOSIS — I10 Essential (primary) hypertension: Secondary | ICD-10-CM | POA: Insufficient documentation

## 2019-08-25 DIAGNOSIS — J45909 Unspecified asthma, uncomplicated: Secondary | ICD-10-CM | POA: Diagnosis not present

## 2019-08-25 DIAGNOSIS — Z79899 Other long term (current) drug therapy: Secondary | ICD-10-CM | POA: Insufficient documentation

## 2019-08-25 DIAGNOSIS — S4991XA Unspecified injury of right shoulder and upper arm, initial encounter: Secondary | ICD-10-CM | POA: Diagnosis present

## 2019-08-25 MED ORDER — HYDROMORPHONE HCL 1 MG/ML IJ SOLN
0.5000 mg | Freq: Once | INTRAMUSCULAR | Status: AC
  Administered 2019-08-25: 0.5 mg via INTRAMUSCULAR
  Filled 2019-08-25: qty 1

## 2019-08-25 MED ORDER — OXYCODONE-ACETAMINOPHEN 5-325 MG PO TABS: 1.0000 | ORAL_TABLET | Freq: Four times a day (QID) | ORAL | 0 refills | Status: DC | PRN

## 2019-08-25 MED ORDER — ONDANSETRON HCL 4 MG PO TABS
4.0000 mg | ORAL_TABLET | Freq: Once | ORAL | Status: AC
  Administered 2019-08-25: 4 mg via ORAL
  Filled 2019-08-25: qty 1

## 2019-08-25 MED ORDER — ASPIRIN EC 325 MG PO TBEC
325.0000 mg | DELAYED_RELEASE_TABLET | Freq: Once | ORAL | Status: DC
  Filled 2019-08-25: qty 1

## 2019-08-25 NOTE — Discharge Instructions (Addendum)
You have been seen today for after falling. Please read and follow all provided instructions. Return to the emergency room for worsening condition or new concerning symptoms including chest pain, shortness of breath, numbness or tingling in your arm, decrease in station in your arm, extremely cold fingers.  Your x-ray shows you have a right humeral neck and greater tuberosity fracture  1. Medications:  Prescription sent to your pharmacy for oxycodone.  This is a narcotic pain medication.  Please take as prescribed.  You have taken this medicine in the past.  This medicine can make you drowsy so do not drive or work when taking it. -You can continue taking aspirin for pain.  Continue usual home medications Take medications as prescribed. Please review all of the medicines and only take them if you do not have an allergy to them.   2. Treatment: rest, drink plenty of fluids. Wear the sling until you see the orthopedist doctor.  3. Follow Up:  Please follow up with Dr. Mardelle Matte. He is the orthopedist on-call for the hospital today. Call his office Monday to schedule a follow up appointment to be seen in a week   It is also a possibility that you have an allergic reaction to any of the medicines that you have been prescribed - Everybody reacts differently to medications and while MOST people have no trouble with most medicines, you may have a reaction such as nausea, vomiting, rash, swelling, shortness of breath. If this is the case, please stop taking the medicine immediately and contact your physician.  ?

## 2019-08-25 NOTE — ED Provider Notes (Signed)
[facility] DEPT Provider Note   CSN: [id number] Arrival date & time: 08/25/19  1353     History Chief Complaint  Patient presents with   Shoulder Pain    Miranda Cochran is a 69 y.o. 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  Patient states she was helping a friend move when she tripped over a pillow and landed on her right shoulder.  She denies hitting her head or loss of consciousness.  She had immediate pain in her right shoulder.  She describes it as a throbbing sensation.  Pain has been constant.  She rates the pain 5 out of 10 in severity.  She did not take any for pain prior to arrival.  She is not anticoagulated.  On EMS arrival she did endorse right-sided neck pain and cervical collar was applied.  She denies fever, chills, headache, visual changes, numbness, tingling, weakness, back pain.    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 08/25/2016   Sinusitis, chronic    Thyroid nodule    Umbilical hernia    Uterine fibroid    Vascular disease    Vitamin D deficiency     Patient Active Problem List   Diagnosis Date Noted   Family history of colon cancer in father dx 49 and died 93 w/ second cancer 2019-08-25   Insomnia 06/11/2019   Asthma 06/11/2019   Venous  (peripheral) insufficiency 04/12/2018   Long term (current) use of systemic steroids 11/02/2017   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   Iron deficiency anemia due to chronic 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 08/25/2016   Autoimmune hepatitis treated with steroids ([place]) 08/18/2016   Sinusitis, chronic    Hypertension    Hyperlipidemia    Vitamin D deficiency    Thyroid nodule 04/29/2011   Hx of adenomatous polyp of colon 07/07/2009   IRRITABLE BOWEL SYNDROME 10/26/2007   Uncontrolled diabetes mellitus ([place]) 10/24/2007   Anxiety state 10/24/2007   GERD 10/24/2007    Past Surgical History:  Procedure Laterality Date   BREAST EXCISIONAL BIOPSY Left    BREAST EXCISIONAL BIOPSY Right    CATARACT EXTRACTION Bilateral    COLONOSCOPY  05/23/2014   per Dr. Carlean Purl, adenimatous polyps, repeat in 7 yrs    ESOPHAGOGASTRODUODENOSCOPY  02/2002   ESOPHAGOGASTRODUODENOSCOPY (EGD) WITH PROPOFOL N/A 06/20/2017   Procedure: ESOPHAGOGASTRODUODENOSCOPY (EGD) WITH PROPOFOL;  Surgeon: Gatha Mayer, MD;  Location: [facility];  Service: Endoscopy;  Laterality: N/A;  with Barrx   EYE SURGERY     NASAL SEPTOPLASTY W/ TURBINOPLASTY     TONSILLECTOMY       OB History   No obstetric history on file.     Family History  Problem Relation Age of Onset   Stroke Mother  Hypertension Mother    Colon cancer Father 32   Liver cancer Father    Heart disease Sister        hole in heart   Stroke Maternal Grandmother    Hypertension Maternal Grandmother    Esophageal cancer Maternal Grandfather    Breast cancer Paternal Grandmother    Brain cancer Paternal Grandmother    Lung cancer Paternal Grandfather    Diabetes Neg Hx     Social History   Tobacco Use   Smoking status: Never Smoker   Smokeless tobacco: Never Used  Substance Use Topics   Alcohol use: Yes    Comment: rare   Drug use: No    Home Medications Prior to Admission medications     Medication Sig Start Date End Date Taking? Authorizing Provider  ALPRAZolam Duanne Moron) 0.5 MG tablet Take 1 tablet (0.5 mg total) by mouth at bedtime as needed for sleep. 06/11/19   Laurey Morale, MD  aspirin 325 MG tablet Take 650-975 mg by mouth daily as needed for moderate pain or headache.     [provider]  azelastine (OPTIVAR) 0.05 % ophthalmic solution INSTILL 1 DROP IN AFFECTED EYE(S) DAILY 06/29/18   Laurey Morale, MD  Blood Glucose Monitoring Suppl (ONETOUCH VERIO FLEX SYSTEM) w/Device KIT Use Onetouch Verio Flex to check blood sugar once daily. 12/22/18   Elayne Snare, MD  budesonide (ENTOCORT EC) 3 MG 24 hr capsule Take 2 capsules (6 mg total) by mouth daily. 06/11/19   Laurey Morale, MD  cyclobenzaprine (FLEXERIL) 10 MG tablet Take 1 tablet (10 mg total) by mouth every 8 (eight) hours as needed for muscle spasms. take 1 tablet by mouth at bedtime if needed 06/11/19   Laurey Morale, MD  diphenhydrAMINE (BENADRYL) 25 mg capsule Take 25-50 mg by mouth daily as needed for allergies.     [provider]  diphenhydrAMINE-zinc acetate (BENADRYL) cream Apply 1 application topically daily as needed for itching.    [provider]  EFFEXOR XR 75 MG 24 hr capsule TAKE 1 CAPSULE BY MOUTH TWICE DAILY WITH A MEAL 04/20/19   Gatha Mayer, MD  ezetimibe (ZETIA) 10 MG tablet TAKE 1 TABLET(10 MG) BY MOUTH DAILY 07/04/19   Elayne Snare, MD  ferrous gluconate (FERGON) 324 MG tablet Take 324 mg by mouth daily.    [provider]  Fluticasone-Salmeterol (ADVAIR DISKUS) 100-50 MCG/DOSE AEPB Inhale 1 puff into the lungs 2 (two) times daily. 06/11/19   Laurey Morale, MD  furosemide (LASIX) 20 MG tablet Take 1 tablet (20 mg total) by mouth daily as needed. 11/02/17   Gatha Mayer, MD  glucose blood test strip Use Onetouch verio test strips as instructed to check blood sugar once daily. 12/22/18   Elayne Snare, MD  Lancets [facility] DELICA PLUS [id number]) MISC USE AS DIRECTED  UP TO 4 TIMES DAILY AS DIRECTED 12/04/18   Elayne Snare, MD  lansoprazole (PREVACID) 30 MG capsule TAKE 1 CAPSULE(30 MG) BY MOUTH DAILY 07/23/19   Gatha Mayer, MD  montelukast (SINGULAIR) 10 MG tablet TAKE 1 TABLET BY MOUTH AT BEDTIME. 06/11/19   Laurey Morale, MD  Multiple Vitamins-Minerals (HAIR SKIN AND NAILS FORMULA) TABS Take 1 tablet by mouth daily.    [provider]  oxyCODONE-acetaminophen (PERCOCET/ROXICET) 5-325 MG tablet Take 1 tablet by mouth every 6 (six) hours as needed for severe pain. 08/25/19   Niala Stcharles E, PA-C  phenylephrine (SUDAFED PE) 10 MG TABS tablet  Take 10 mg by mouth every 4 (four) hours as needed.    [provider]  promethazine (PHENERGAN) 12.5 MG tablet Take 1 tablet (12.5 mg total) by mouth every 8 (eight) hours as needed for nausea or vomiting. 06/29/18   Gatha Mayer, MD  pseudoephedrine (SUDAFED) 30 MG tablet Take 30 mg by mouth daily as needed for congestion.     [provider]  repaglinide (PRANDIN) 0.5 MG tablet TAKE 1 TABLET(0.5 MG) BY MOUTH TWICE DAILY BEFORE A MEAL 06/22/19   Elayne Snare, MD  sodium chloride (BRONCHO SALINE) inhaler solution Take 1 spray by nebulization as needed.    [provider]  TRULICITY 1.5 PI/9.5JO SOPN ADMINISTER 0.5 ML UNDER THE SKIN 1 TIME WEEKLY AS DIRECTED 08/13/19   Elayne Snare, MD    Allergies    Fentanyl, Levofloxacin, Prednisone, Amoxicillin, Augmentin [amoxicillin-pot clavulanate], Farxiga [dapagliflozin], and Statins  Review of Systems   Review of Systems All other systems are reviewed and are negative for acute change except as noted in the HPI.  Physical Exam Updated Vital Signs BP (!) 163/86    Pulse 100    Temp 98.5 F (36.9 C) (Oral)    Resp 18    Ht '5\' 5"'  (1.651 m)    Wt 63.2 kg    SpO2 96%    BMI 23.19 kg/m   Physical Exam Vitals and nursing note reviewed.  Constitutional:      General: She is not in acute distress.    Appearance: She is not ill-appearing.      Interventions: Cervical collar in place.  HENT:     Head: Normocephalic and atraumatic.     Comments: No tenderness to palpation of skull. No deformities or crepitus noted. No open wounds, abrasions or lacerations.     Right Ear: Tympanic membrane and external ear normal.     Left Ear: Tympanic membrane and external ear normal.     Nose: Nose normal.     Mouth/Throat:     Mouth: Mucous membranes are moist.     Pharynx: Oropharynx is clear.  Eyes:     General: No scleral icterus.       Right eye: No discharge.        Left eye: No discharge.     Extraocular Movements: Extraocular movements intact.     Conjunctiva/sclera: Conjunctivae normal.     Pupils: Pupils are equal, round, and reactive to light.  Neck:     Vascular: No JVD.     Comments: No midline cervical spine tenderness. No bony stepoffs or deformities, mild tenderness to right paraspinous muscle TTP, muscle spasms. No rigidity or meningeal signs. No bruising, erythema, or swelling.  Cardiovascular:     Rate and Rhythm: Normal rate and regular rhythm.     Pulses: Normal pulses.          Radial pulses are 2+ on the right side and 2+ on the left side.     Heart sounds: Normal heart sounds.  Pulmonary:     Comments: Lungs clear to auscultation in all fields. Symmetric chest rise. No wheezing, rales, or rhonchi. Abdominal:     Comments: Abdomen is soft, non-distended, and non-tender in all quadrants. No rigidity, no guarding. No peritoneal signs.  Musculoskeletal:        General: Normal range of motion.     Right shoulder: Bony tenderness present. No swelling, deformity, laceration or crepitus. Normal strength.     Right upper arm: Normal.  Right elbow: Laceration present. No swelling, deformity or effusion. No tenderness.     Right forearm: Normal.     Right wrist: Normal. No snuff box tenderness.     Comments: 2x3 cm skin tear to right elbow, no active bleeding  Radial pulse 2+, strong. Able wiggle all fingers.  Fingernails are painted, cannot check cap refill  Skin:    General: Skin is warm and dry.     Capillary Refill: Capillary refill takes less than 2 seconds.  Neurological:     Mental Status: She is oriented to person, place, and time.     GCS: GCS eye subscore is 4. GCS verbal subscore is 5. GCS motor subscore is 6.     Comments: Fluent speech, no facial droop.  Psychiatric:        Behavior: Behavior normal.       ED Results / Procedures / Treatments   Labs (all labs ordered are listed, but only abnormal results are displayed) Labs Reviewed - No data to display  EKG None  Radiology DG Shoulder Right  Result Date: 08/25/2019 CLINICAL DATA:  Acute RIGHT shoulder pain following fall today. Initial encounter. EXAM: RIGHT SHOULDER - 2+ VIEW COMPARISON:  None. FINDINGS: A nondisplaced transverse fracture of the humeral neck is present. A vertical fracture of the greater tuberosity noted with 2 mm displacement. There is no evidence of dislocation. No other acute bony abnormalities are noted. IMPRESSION: Right humeral neck and greater tuberosity fractures as described. No dislocation. Electronically Signed   By: Margarette Canada M.D.   On: 08/25/2019 14:49   DG Elbow 2 Views Right  Result Date: 08/25/2019 CLINICAL DATA:  Acute RIGHT elbow pain following fall. Initial encounter. EXAM: RIGHT ELBOW - 2 VIEW COMPARISON:  None. FINDINGS: There is no evidence of fracture, dislocation, or joint effusion. There is no evidence of arthropathy or other focal bone abnormality. Soft tissues are unremarkable. IMPRESSION: Negative. Electronically Signed   By: Margarette Canada M.D.   On: 08/25/2019 14:50    Procedures Procedures (including critical care time)  Medications Ordered in ED Medications  aspirin EC tablet 325 mg (325 mg Oral Refused 08/25/19 1506)  HYDROmorphone (DILAUDID) injection 0.5 mg (0.5 mg Intramuscular Given 08/25/19 1504)  ondansetron (ZOFRAN) tablet 4 mg (4 mg Oral Given 08/25/19 1503)     ED Course  I have reviewed the triage vital signs and the nursing notes.  Pertinent labs & imaging results that were available during my care of the patient were reviewed by me and considered in my medical decision making (see chart for details).    MDM Rules/Calculators/A&P                       Patient presents to the ED with complaints of pain to the right shoulder s/p injury of mechanical fall. C-spine cleared using Nexus criteria. Full ROM of neck without pain on my exam. No signs of serious head or neck injury. Patient able to fuly describe all events leading up to and after fall. Exam without obvious deformity or open wounds of right upper extremity. ROM decreased secondary to pain. Tender to palpation. NVI distally. Skin tear on right elbow. Patient refuses to updated Tdap, risks discussed. Wound irrigated, not amendable to suture repair. Discussed wound home care. Therapeutic sling provided. I have reviewed the PDMP during this encounter.Patient narcotic prescriptions, will discharge with course of Percocet for pain. PRICE and motrin recommended. I discussed results, treatment plan, need for orthopedic  follow-up, and return precautions with the patient. Provided opportunity for questions, patient confirmed understanding and are in agreement with plan. The patient was discussed with and seen by Dr. Tomi Bamberger who agrees with the treatment plan.   Portions of this note were generated with [employer]. Dictation errors may occur despite best attempts at proofreading.  Final Clinical Impression(s) / ED Diagnoses Final diagnoses:  Closed nondisplaced fracture of greater tuberosity of right humerus, initial encounter    Rx / DC Orders ED Discharge Orders         Ordered    oxyCODONE-acetaminophen (PERCOCET/ROXICET) 5-325 MG tablet  Every 6 hours PRN     08/25/19 1548           Hera Celaya, Harley Hallmark, PA-C 08/25/19 1708    Dorie Rank, MD 08/26/19 1208

## 2019-08-25 NOTE — ED Provider Notes (Signed)
Medical screening examination/treatment/procedure(s) were conducted as a shared visit with non-physician practitioner(s) and myself.  I personally evaluated the patient during the encounter.   Xray with humeral neck and greater tuberosity fracture.  No dislocation.  Will immobilize, outpt follow up with ortho.    Dorie Rank, MD 08/25/19 [phone]

## 2019-08-25 NOTE — ED Triage Notes (Signed)
Pt to ED by [facility] with c/o of shoulder pain. Pt was at friends home earlier today and tripped over a pillow on floor and fell on her right shoulder. Pt rates pain 5/10 and sharp pain on movement with right neck pain. Pt is A&O x4. No loc and not taking any blood thinners.

## 2019-08-26 ENCOUNTER — Other Ambulatory Visit: Payer: Self-pay | Admitting: Endocrinology

## 2019-08-26 DIAGNOSIS — E1165 Type 2 diabetes mellitus with hyperglycemia: Secondary | ICD-10-CM

## 2019-08-26 DIAGNOSIS — D500 Iron deficiency anemia secondary to blood loss (chronic): Secondary | ICD-10-CM

## 2019-08-27 ENCOUNTER — Ambulatory Visit: Payer: 59 | Admitting: Obstetrics & Gynecology

## 2019-08-28 ENCOUNTER — Telehealth: Payer: Self-pay | Admitting: Family Medicine

## 2019-08-28 NOTE — Telephone Encounter (Signed)
Pt wanted to let Dr.Fry know that she fell on Saturday and went to ER found out she has a humerus fracture of her right arm. Pt has a orthopedic appt on 09/05/19.

## 2019-08-29 ENCOUNTER — Other Ambulatory Visit: Payer: 59

## 2019-08-29 NOTE — Telephone Encounter (Signed)
FYI

## 2019-08-31 ENCOUNTER — Telehealth: Payer: Self-pay | Admitting: *Deleted

## 2019-08-31 MED ORDER — OXYCODONE-ACETAMINOPHEN 5-325 MG PO TABS: 1.0000 | ORAL_TABLET | ORAL | 0 refills | Status: DC | PRN

## 2019-08-31 NOTE — Telephone Encounter (Signed)
Pt called and notified

## 2019-08-31 NOTE — Telephone Encounter (Signed)
See below

## 2019-08-31 NOTE — Telephone Encounter (Signed)
Noted  

## 2019-08-31 NOTE — Telephone Encounter (Signed)
I sent in #30  

## 2019-08-31 NOTE — Telephone Encounter (Signed)
Pt called to see if Dr. Sarajane Jews would send in more pain medication due to her fall. Advised pt of previous note and that we are waiting for Dr.Fry to have a moment to check his messages. If refill can be sent pt would like it to be sent to [facility].

## 2019-08-31 NOTE — Telephone Encounter (Signed)
Patient called after hours line 08/30/2019. Patient reports she fell and broke her arm last week, caller was given 10 oxycodone for pain but she is out of the medication and needs a refill.   Pt has a orthopedic appt on 09/05/19.

## 2019-09-03 ENCOUNTER — Ambulatory Visit: Payer: 59 | Admitting: Endocrinology

## 2019-09-07 ENCOUNTER — Other Ambulatory Visit: Payer: Self-pay | Admitting: Internal Medicine

## 2019-09-25 ENCOUNTER — Telehealth: Payer: Self-pay | Admitting: Family Medicine

## 2019-09-25 NOTE — Telephone Encounter (Signed)
Last filled 08/31/2019 Last OV 08/01/2019  Ok to fill?

## 2019-09-25 NOTE — Telephone Encounter (Signed)
Medication Refill: Oxycodone  Pharmacy: [facility] [address]: [phone]   Pt is wondering if Miranda Cochran will call in a px for Lasix as well because she is retaining fluid pretty bad (swollen ankles) and think its from the pain mediation

## 2019-09-25 NOTE — Telephone Encounter (Signed)
Please call in Lasix 20 mg to take daily as needed for fluid, #30 with 3 rf. She needs to follow up with Orthopedics for pain medication. The narcotics were not intended for long term use

## 2019-09-26 MED ORDER — FUROSEMIDE 20 MG PO TABS: 20.0000 mg | ORAL_TABLET | Freq: Every day | ORAL | 3 refills | Status: DC | PRN

## 2019-09-26 NOTE — Addendum Note (Signed)
Addended by: Rebecca Eaton on: 09/26/2019 10:19 AM   Modules accepted: Orders

## 2019-09-27 ENCOUNTER — Other Ambulatory Visit: Payer: Self-pay | Admitting: Endocrinology

## 2019-10-16 ENCOUNTER — Other Ambulatory Visit: Payer: Self-pay | Admitting: Family Medicine

## 2019-10-16 DIAGNOSIS — R52 Pain, unspecified: Secondary | ICD-10-CM

## 2019-10-24 ENCOUNTER — Other Ambulatory Visit: Payer: Self-pay | Admitting: Endocrinology

## 2019-11-08 ENCOUNTER — Other Ambulatory Visit: Payer: Self-pay | Admitting: Endocrinology

## 2019-11-11 ENCOUNTER — Other Ambulatory Visit: Payer: Self-pay | Admitting: Endocrinology

## 2019-11-22 ENCOUNTER — Other Ambulatory Visit: Payer: Self-pay | Admitting: Endocrinology

## 2019-11-29 ENCOUNTER — Other Ambulatory Visit: Payer: 59

## 2019-11-30 ENCOUNTER — Other Ambulatory Visit: Payer: Self-pay

## 2019-11-30 ENCOUNTER — Other Ambulatory Visit (INDEPENDENT_AMBULATORY_CARE_PROVIDER_SITE_OTHER): Payer: 59

## 2019-11-30 DIAGNOSIS — D500 Iron deficiency anemia secondary to blood loss (chronic): Secondary | ICD-10-CM

## 2019-11-30 DIAGNOSIS — E1165 Type 2 diabetes mellitus with hyperglycemia: Secondary | ICD-10-CM

## 2019-11-30 LAB — COMPREHENSIVE METABOLIC PANEL
ALT: 49 U/L — ABNORMAL HIGH (ref 0–35)
AST: 44 U/L — ABNORMAL HIGH (ref 0–37)
Albumin: 4 g/dL (ref 3.5–5.2)
Alkaline Phosphatase: 97 U/L (ref 39–117)
BUN: 13 mg/dL (ref 6–23)
CO2: 30 mEq/L (ref 19–32)
Calcium: 9.1 mg/dL (ref 8.4–10.5)
Chloride: 100 mEq/L (ref 96–112)
Creatinine, Ser: 0.53 mg/dL (ref 0.40–1.20)
GFR: 114.85 mL/min (ref >60.00)
Glucose, Bld: 140 mg/dL — ABNORMAL HIGH (ref 70–99)
Potassium: 3.4 mEq/L — ABNORMAL LOW (ref 3.5–5.1)
Sodium: 135 mEq/L (ref 135–145)
Total Bilirubin: 1.3 mg/dL — ABNORMAL HIGH (ref 0.2–1.2)
Total Protein: 7.5 g/dL (ref 6.0–8.3)

## 2019-11-30 LAB — CBC WITH DIFFERENTIAL/PLATELET
Basophils Absolute: 0 10*3/uL (ref 0.0–0.1)
Basophils Relative: 0.3 % (ref 0.0–3.0)
Eosinophils Absolute: 0.2 10*3/uL (ref 0.0–0.7)
Eosinophils Relative: 2.2 % (ref 0.0–5.0)
HCT: 40.5 % (ref 36.0–46.0)
Hemoglobin: 14.4 g/dL (ref 12.0–15.0)
Lymphocytes Relative: 44.2 % (ref 12.0–46.0)
Lymphs Abs: 3.3 10*3/uL (ref 0.7–4.0)
MCHC: 35.7 g/dL (ref 30.0–36.0)
MCV: 101.2 fl — ABNORMAL HIGH (ref 78.0–100.0)
Monocytes Absolute: 0.6 10*3/uL (ref 0.1–1.0)
Monocytes Relative: 8.7 % (ref 3.0–12.0)
Neutro Abs: 3.3 10*3/uL (ref 1.4–7.7)
Neutrophils Relative %: 44.6 % (ref 43.0–77.0)
Platelets: 150 10*3/uL (ref 150.0–400.0)
RBC: 4 Mil/uL (ref 3.87–5.11)
RDW: 13.8 % (ref 11.5–15.5)
WBC: 7.4 10*3/uL (ref 4.0–10.5)

## 2019-11-30 LAB — HEMOGLOBIN A1C: Hgb A1c MFr Bld: 6.4 % (ref 4.6–6.5)

## 2019-11-30 LAB — FERRITIN: Ferritin: 93.7 ng/mL (ref 10.0–291.0)

## 2019-12-01 ENCOUNTER — Other Ambulatory Visit: Payer: Self-pay | Admitting: Family Medicine

## 2019-12-01 DIAGNOSIS — J40 Bronchitis, not specified as acute or chronic: Secondary | ICD-10-CM

## 2019-12-01 LAB — FRUCTOSAMINE: Fructosamine: 302 umol/L — ABNORMAL HIGH (ref 0–285)

## 2019-12-03 ENCOUNTER — Other Ambulatory Visit: Payer: Self-pay

## 2019-12-03 ENCOUNTER — Ambulatory Visit: Payer: 59 | Admitting: Endocrinology

## 2019-12-03 ENCOUNTER — Encounter: Payer: Self-pay | Admitting: Endocrinology

## 2019-12-03 VITALS — BP 150/90 | HR 90 | Ht 65.0 in | Wt 140.2 lb

## 2019-12-03 DIAGNOSIS — E1165 Type 2 diabetes mellitus with hyperglycemia: Secondary | ICD-10-CM | POA: Diagnosis not present

## 2019-12-03 DIAGNOSIS — E876 Hypokalemia: Secondary | ICD-10-CM

## 2019-12-03 DIAGNOSIS — Z780 Asymptomatic menopausal state: Secondary | ICD-10-CM

## 2019-12-03 DIAGNOSIS — R945 Abnormal results of liver function studies: Secondary | ICD-10-CM | POA: Diagnosis not present

## 2019-12-03 DIAGNOSIS — R7989 Other specified abnormal findings of blood chemistry: Secondary | ICD-10-CM

## 2019-12-03 MED ORDER — TRULICITY 3 MG/0.5ML ~~LOC~~ SOAJ: 3.0000 mg | SUBCUTANEOUS | 2 refills | Status: DC

## 2019-12-03 NOTE — Patient Instructions (Addendum)
Check blood sugars on waking up 2-3 days a week  Also check blood sugars about 2 hours after meals and do this after different meals by rotation  Recommended blood sugar levels on waking up are 90-130 and about 2 hours after meal is 130-160  Please bring your blood sugar monitor to each visit, thank you  Trulicity 3mg  weekly  Call Dr Sarajane Jews for appt

## 2019-12-03 NOTE — Progress Notes (Signed)
[name], female   DOB: 20-Sep-1950, 69 y.o.   MRN: [id number]          Reason for Appointment: Follow-up for Type 2 Diabetes    History of Present Illness:          Date of diagnosis of type 2 diabetes mellitus: ?  2013        Background history:   She had a blood sugar over 200 in 2013 but was not told to have diabetes until 04/2015 A1c was 7.3 in 3/16 In 04/2015 blood sugar was 478 says her blood sugar was 600 when she was diagnosed and was not having any significant symptoms Initially treated with Metaglip but this apparently caused abdominal discomfort and she was switched to metformin In 2017 she had been tried on Bydureon for better control but she stopped this after a few weeks because of large persisted nodules in her skin She thinks her blood sugars were better with this and A1c was down to 6.8  Recent history:   Non-insulin hypoglycemic drugs the patient is taking are: Trulicity 1.5 mg weekly,Prandin 0.5 mg twice daily  Her A1c is usually falsely low and is now 6.4 compared to 5.8, has been as low as 5.3  Fructosamine is 302 compared to 284 previously  Current management, blood sugar patterns and problems identified:    She has not been seen in follow-up since December  She thinks her blood sugar may be higher because of recovering from an arm fracture in March  However checking blood sugar very sporadically and not after dinner  Does not appear to be having as high fasting readings as before  Not been doing any exercise lately  She is concerned about wanting to lose weight but she thinks she is cutting back on carbohydrates  Occasionally will have a drink of Pepsi which will make her sugar go up  She does think that she is taking her Trulicity regularly and Prandin before breakfast and dinner  She was offered Rybelsus on her last visit since she did not want to take injectables but she decided to continue the Trulicity  She does not see  the dietitian because of cost        Side effects from medications have been: Metaglip caused abdominal pain, Tradjenta caused swelling of the legs, Bydureon caused skin nodules, Farxiga caused diarrhea, glipizide causes dizziness and weakness, Metformin caused GI side effects, Invokana causes yeast infections  Compliance with the medical regimen: Inconsistent  Glucose monitoring:  done less than 1 times a day         Glucometer: One Verio    Blood Glucose readings by time of day by monitor download:  Recent CHANGE 121-164 AVERAGE 135 with only 6 readings  Previously:  PRE-MEAL  morning Lunch Dinner Bedtime Overall  Glucose range:  138-202  138, 154  85, 103    Mean/median:  163     130   Previous readings: Recent range 97-147 with most readings around 1 PM    Self-care:  Typical meal intake: Breakfast is Oatmeal, usually eating low-fat dinner and her snacks will be fruit, peanut butter crackers                Dietician visit, most recent: None               Weight history:  Wt Readings from Last 3 Encounters:  12/03/19 140 lb 3.2 oz (63.6 kg)  08/25/19 139 lb 6  oz (63.2 kg)  08/24/19 139 lb 6 oz (63.2 kg)    Glycemic control:   Lab Results  Component Value Date   HGBA1C 6.4 11/30/2019   HGBA1C 5.8 (A) 06/04/2019   HGBA1C 5.3 12/20/2018   Lab Results  Component Value Date   MICROALBUR 21.6 (H) 12/20/2018   LDLCALC 156 (H) 08/01/2019   CREATININE 0.53 11/30/2019   Lab Results  Component Value Date   MICRALBCREAT 21.3 12/20/2018    Lab Results  Component Value Date   FRUCTOSAMINE 302 (H) 11/30/2019   FRUCTOSAMINE 284 06/04/2019   FRUCTOSAMINE 265 02/27/2019    Lab on 11/30/2019  Component Date Value Ref Range Status  . Fructosamine 11/30/2019 302* 0 - 285 umol/L Final   Comment: Published reference interval for apparently healthy subjects between age 81 and 87 is 52 - 285 umol/L and in a poorly controlled diabetic population is 228 - 563 umol/L with  a mean of 396 umol/L.   Marland Kitchen Ferritin 11/30/2019 93.7  10.0 - 291.0 ng/mL Final  . WBC 11/30/2019 7.4  4.0 - 10.5 K/uL Final  . RBC 11/30/2019 4.00  3.87 - 5.11 Mil/uL Final  . Hemoglobin 11/30/2019 14.4  12.0 - 15.0 g/dL Final  . HCT 11/30/2019 40.5  36 - 46 % Final  . MCV 11/30/2019 101.2* 78.0 - 100.0 fl Final  . MCHC 11/30/2019 35.7  30.0 - 36.0 g/dL Final  . RDW 11/30/2019 13.8  11.5 - 15.5 % Final  . Platelets 11/30/2019 150.0  150 - 400 K/uL Final  . Neutrophils Relative % 11/30/2019 44.6  43 - 77 % Final  . Lymphocytes Relative 11/30/2019 44.2  12 - 46 % Final  . Monocytes Relative 11/30/2019 8.7  3 - 12 % Final  . Eosinophils Relative 11/30/2019 2.2  0 - 5 % Final  . Basophils Relative 11/30/2019 0.3  0 - 3 % Final  . Neutro Abs 11/30/2019 3.3  1.4 - 7.7 K/uL Final  . Lymphs Abs 11/30/2019 3.3  0.7 - 4.0 K/uL Final  . Monocytes Absolute 11/30/2019 0.6  0 - 1 K/uL Final  . Eosinophils Absolute 11/30/2019 0.2  0 - 0 K/uL Final  . Basophils Absolute 11/30/2019 0.0  0 - 0 K/uL Final  . Sodium 11/30/2019 135  135 - 145 mEq/L Final  . Potassium 11/30/2019 3.4* 3.5 - 5.1 mEq/L Final  . Chloride 11/30/2019 100  96 - 112 mEq/L Final  . CO2 11/30/2019 30  19 - 32 mEq/L Final  . Glucose, Bld 11/30/2019 140* 70 - 99 mg/dL Final  . BUN 11/30/2019 13  6 - 23 mg/dL Final  . Creatinine, Ser 11/30/2019 0.53  0.40 - 1.20 mg/dL Final  . Total Bilirubin 11/30/2019 1.3* 0.2 - 1.2 mg/dL Final  . Alkaline Phosphatase 11/30/2019 97  39 - 117 U/L Final  . AST 11/30/2019 44* 0 - 37 U/L Final  . ALT 11/30/2019 49* 0 - 35 U/L Final  . Total Protein 11/30/2019 7.5  6.0 - 8.3 g/dL Final  . Albumin 11/30/2019 4.0  3.5 - 5.2 g/dL Final  . GFR 11/30/2019 114.85  >60.00 mL/min Final  . Calcium 11/30/2019 9.1  8.4 - 10.5 mg/dL Final  . Hgb A1c MFr Bld 11/30/2019 6.4  4.6 - 6.5 % Final   Glycemic Control Guidelines for People with Diabetes:Non Diabetic:  <6%Goal of Therapy: <7%Additional Action Suggested:   >8%      Allergies as of 12/03/2019      Reactions   Fentanyl  Anxiety   Made her feel very anxious, requests not to get it.   Levofloxacin Anxiety   Weird dreams    Prednisone    Anxiety and paranoia   Amoxicillin Diarrhea   Has patient had a PCN reaction causing immediate rash, facial/tongue/throat swelling, SOB or lightheadedness with hypotension: No Has patient had a PCN reaction causing severe rash involving mucus membranes or skin necrosis: No Has patient had a PCN reaction that required hospitalization: No Has patient had a PCN reaction occurring within the last 10 years: Yes If all of the above answers are "NO", then may proceed with Cephalosporin use.   Augmentin [amoxicillin-pot Clavulanate] Other (See Comments)   diarrhea   Farxiga [dapagliflozin] Diarrhea   Gi pain   Statins    Myalgia       Medication List       Accurate as of December 03, 2019  9:43 AM. If you have any questions, ask your nurse or doctor.        ALPRAZolam 0.5 MG tablet Commonly known as: XANAX Take 1 tablet (0.5 mg total) by mouth at bedtime as needed for sleep.   aspirin 325 MG tablet Take 650-975 mg by mouth daily as needed for moderate pain or headache.   azelastine 0.05 % ophthalmic solution Commonly known as: OPTIVAR INSTILL 1 DROP IN AFFECTED EYE(S) DAILY   budesonide 3 MG 24 hr capsule Commonly known as: ENTOCORT EC TAKE 3 CAPSULES(9 MG) BY MOUTH DAILY   cyclobenzaprine 10 MG tablet Commonly known as: FLEXERIL TAKE 1 TABLET BY MOUTH EVERY 8 HOURS AS NEEDED FOR MUSCLE SPASMS WITH ONE BEING AT BEDTIME IF NEEDED   diphenhydrAMINE 25 mg capsule Commonly known as: BENADRYL Take 25-50 mg by mouth daily as needed for allergies.   diphenhydrAMINE-zinc acetate cream Commonly known as: BENADRYL Apply 1 application topically daily as needed for itching.   Effexor XR 75 MG 24 hr capsule Generic drug: venlafaxine XR TAKE 1 CAPSULE BY MOUTH TWICE DAILY WITH A MEAL   ezetimibe 10 MG  tablet Commonly known as: ZETIA TAKE 1 TABLET(10 MG) BY MOUTH DAILY   ferrous gluconate 324 MG tablet Commonly known as: FERGON Take 324 mg by mouth daily.   Fluticasone-Salmeterol 100-50 MCG/DOSE Aepb Commonly known as: Advair Diskus Inhale 1 puff into the lungs 2 (two) times daily.   furosemide 20 MG tablet Commonly known as: LASIX Take 1 tablet (20 mg total) by mouth daily as needed.   glucose blood test strip Use Onetouch verio test strips as instructed to check blood sugar once daily.   Hair Skin and Nails Formula Tabs Take 1 tablet by mouth daily.   lansoprazole 30 MG capsule Commonly known as: PREVACID TAKE 1 CAPSULE(30 MG) BY MOUTH DAILY   montelukast 10 MG tablet Commonly known as: SINGULAIR TAKE 1 TABLET BY MOUTH AT BEDTIME.   OneTouch Delica Plus [id number] Misc USE AS DIRECTED UP TO 4 TIMES DAILY AS DIRECTED   OneTouch Verio Flex System w/Device Kit Use Onetouch Verio Flex to check blood sugar once daily.   oxyCODONE-acetaminophen 5-325 MG tablet Commonly known as: PERCOCET/ROXICET Take 1 tablet by mouth every 4 (four) hours as needed for severe pain.   phenylephrine 10 MG Tabs tablet Commonly known as: SUDAFED PE Take 10 mg by mouth every 4 (four) hours as needed.   promethazine 12.5 MG tablet Commonly known as: PHENERGAN Take 1 tablet (12.5 mg total) by mouth every 8 (eight) hours as needed for nausea or vomiting.  pseudoephedrine 30 MG tablet Commonly known as: SUDAFED Take 30 mg by mouth daily as needed for congestion.   repaglinide 0.5 MG tablet Commonly known as: PRANDIN TAKE 1 TABLET(0.5 MG) BY MOUTH TWICE DAILY BEFORE A MEAL   sodium chloride inhaler solution Commonly known as: BRONCHO SALINE Take 1 spray by nebulization as needed.   Trulicity 1.5 MV/6.7MC Sopn Generic drug: Dulaglutide ADMINISTER 0.5 ML UNDER THE SKIN 1 TIME WEEKLY AS DIRECTED       Allergies:  Allergies  Allergen Reactions  . Fentanyl Anxiety    Made her  feel very anxious, requests not to get it.  . Levofloxacin Anxiety    Weird dreams   . Prednisone     Anxiety and paranoia  . Amoxicillin Diarrhea    Has patient had a PCN reaction causing immediate rash, facial/tongue/throat swelling, SOB or lightheadedness with hypotension: No Has patient had a PCN reaction causing severe rash involving mucus membranes or skin necrosis: No Has patient had a PCN reaction that required hospitalization: No Has patient had a PCN reaction occurring within the last 10 years: Yes If all of the above answers are "NO", then may proceed with Cephalosporin use.   . Augmentin [Amoxicillin-Pot Clavulanate] Other (See Comments)    diarrhea  . Wilder Glade [Dapagliflozin] Diarrhea    Gi pain  . Statins     Myalgia     Past Medical History:  Diagnosis Date  . Allergy   . Anxiety   . Asthma   . Autoimmune hepatitis ([place]) 08/18/2016   08/2016 liver bx confirms, fibrosis not cirrhosis  . Depression   . DM (diabetes mellitus) ([place])   . 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 GERD (gastroesophageal reflux disease)   . Hiatal hernia   . Hx of adenomatous polyp of colon 07/07/2009  . Hyperlipidemia   . Hypertension   . IBS (irritable bowel syndrome)   . Iron deficiency anemia   . 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 Sinusitis, chronic   . Thyroid nodule   . Umbilical hernia   . Uterine fibroid   . Vascular disease   . Vitamin D deficiency     Past Surgical History:  Procedure Laterality Date  . BREAST EXCISIONAL BIOPSY Left   . BREAST EXCISIONAL BIOPSY Right   . CATARACT EXTRACTION Bilateral   . COLONOSCOPY  05/23/2014   per Dr. Carlean Purl, adenimatous polyps, repeat in 7 yrs   . ESOPHAGOGASTRODUODENOSCOPY  02/2002  . ESOPHAGOGASTRODUODENOSCOPY (EGD) WITH PROPOFOL N/A 06/20/2017   Procedure: ESOPHAGOGASTRODUODENOSCOPY (EGD) WITH PROPOFOL;  Surgeon: Gatha Mayer, MD;  Location: [facility];  Service: Endoscopy;   Laterality: N/A;  with Barrx  . EYE SURGERY    . NASAL SEPTOPLASTY W/ TURBINOPLASTY    . TONSILLECTOMY      Family History  Problem Relation Age of Onset  . Stroke Mother   . Hypertension Mother   . Colon cancer Father 19  . Liver cancer Father   . Heart disease Sister        hole in heart  . Stroke Maternal Grandmother   . Hypertension Maternal Grandmother   . Esophageal cancer Maternal Grandfather   . Breast cancer Paternal Grandmother   . Brain cancer Paternal Grandmother   . Lung cancer Paternal Grandfather   . Diabetes Neg Hx     Social History:  reports that she has never smoked. She has never used smokeless tobacco. She reports current alcohol use.  She reports that she does not use drugs.   Review of Systems   Lipid history: She has persistent hypercholesterolemia Highest baseline LDL about 170  Previously had tried Zocor which apparently caused muscle aches, she took Crestor and she thinks generic causes muscle cramps She took Livalo for a couple of days but she thinks she didn't feel well with this and had some palpitations reportedly  She has been prescribed Zetia  Last LDL is slightly higher than before, now done with direct LDL   Lab Results  Component Value Date   CHOL 236 (H) 08/01/2019   CHOL 219 (H) 12/20/2018   CHOL 248 (H) 05/08/2018   Lab Results  Component Value Date   HDL 59.40 08/01/2019   HDL 56.30 12/20/2018   HDL 52.50 05/08/2018   Lab Results  Component Value Date   LDLCALC 156 (H) 08/01/2019   LDLCALC 143 (H) 12/20/2018   LDLCALC 170 (H) 05/08/2018   Lab Results  Component Value Date   TRIG 104.0 08/01/2019   TRIG 101.0 12/20/2018   TRIG 128.0 05/08/2018   Lab Results  Component Value Date   CHOLHDL 4 08/01/2019   CHOLHDL 4 12/20/2018   CHOLHDL 5 05/08/2018   Lab Results  Component Value Date   LDLDIRECT 153.0 02/27/2019           Lab Results  Component Value Date   ALT 49 (H) 11/30/2019    Hypertension: She  thinks she has whitecoat syndrome and her readings are better at home Has not taken her home monitor for comparison to her PCP office   BP Readings from Last 3 Encounters:  12/03/19 (!) 150/90  08/25/19 (!) 153/87  08/24/19 (!) 162/98    She takes Lasix daily for reportedly having swelling in her abdomen Is now having persistent mild hypokalemia and does not like potassium tablets  Lab Results  Component Value Date   K 3.4 (L) 11/30/2019     Most recent eye exam was 9/19  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 Fructosamine 11/30/2019 302* 0 - 285 umol/L Final   Comment: Published reference interval for apparently healthy subjects between age 59 and 19 is 66 - 285 umol/L and in a poorly controlled diabetic population is 228 - 563 umol/L with a mean of 396 umol/L.   Marland Kitchen Ferritin 11/30/2019 93.7  10.0 - 291.0 ng/mL Final  . WBC 11/30/2019 7.4  4.0 - 10.5 K/uL Final  . RBC 11/30/2019 4.00  3.87 - 5.11 Mil/uL Final  . Hemoglobin 11/30/2019 14.4  12.0 - 15.0 g/dL Final  . HCT 11/30/2019 40.5  36 - 46 % Final  . MCV 11/30/2019 101.2* 78.0 - 100.0 fl Final  . MCHC 11/30/2019 35.7  30.0 - 36.0 g/dL Final  . RDW 11/30/2019 13.8  11.5 - 15.5 % Final  . Platelets 11/30/2019 150.0  150 - 400 K/uL Final  . Neutrophils Relative % 11/30/2019 44.6  43 - 77 % Final  . Lymphocytes Relative 11/30/2019 44.2  12 - 46 % Final  . Monocytes Relative 11/30/2019 8.7  3 - 12 % Final  . Eosinophils Relative 11/30/2019 2.2  0 - 5 %  Final  . Basophils Relative 11/30/2019 0.3  0 - 3 % Final  . Neutro Abs 11/30/2019 3.3  1.4 - 7.7 K/uL Final  . Lymphs Abs 11/30/2019 3.3  0.7 - 4.0 K/uL Final  .  Monocytes Absolute 11/30/2019 0.6  0 - 1 K/uL Final  . Eosinophils Absolute 11/30/2019 0.2  0 - 0 K/uL Final  . Basophils Absolute 11/30/2019 0.0  0 - 0 K/uL Final  . Sodium 11/30/2019 135  135 - 145 mEq/L Final  . Potassium 11/30/2019 3.4* 3.5 - 5.1 mEq/L Final  . Chloride 11/30/2019 100  96 - 112 mEq/L Final  . CO2 11/30/2019 30  19 - 32 mEq/L Final  . Glucose, Bld 11/30/2019 140* 70 - 99 mg/dL Final  . BUN 11/30/2019 13  6 - 23 mg/dL Final  . Creatinine, Ser 11/30/2019 0.53  0.40 - 1.20 mg/dL Final  . Total Bilirubin 11/30/2019 1.3* 0.2 - 1.2 mg/dL Final  . Alkaline Phosphatase 11/30/2019 97  39 - 117 U/L Final  . AST 11/30/2019 44* 0 - 37 U/L Final  . ALT 11/30/2019 49* 0 - 35 U/L Final  . Total Protein 11/30/2019 7.5  6.0 - 8.3 g/dL Final  . Albumin 11/30/2019 4.0  3.5 - 5.2 g/dL Final  . GFR 11/30/2019 114.85  >60.00 mL/min Final  . Calcium 11/30/2019 9.1  8.4 - 10.5 mg/dL Final  . Hgb A1c MFr Bld 11/30/2019 6.4  4.6 - 6.5 % Final   Glycemic Control Guidelines for People with Diabetes:Non Diabetic:  <6%Goal of Therapy: <7%Additional Action Suggested:  >8%     Physical Examination:  BP (!) 150/90 (BP Location: Left Arm, Patient Position: Sitting, Cuff Size: Normal)   Pulse 90   Ht _0  (1.651 m)   Wt 140 lb 3.2 oz (63.6 kg)   SpO2 95%   BMI 23.33 kg/m       ASSESSMENT:  Diabetes type 2, nonobese  See history of present illness for detailed discussion of current diabetes management, blood sugar patterns and problems identified  A1c has been usually lower than expected, now 5.8 compared to 5.3 Fructosamine has been previously high normal and pending from today  She has only 6 readings in the last month at home and none after dinner Fasting readings appear to be relatively better even with only taking Prandin and not a longer acting medication or Metformin She has however continued Trulicity without side effects as recommended She thinks that she should lose more  weight  Lipids: Will need follow-up in the next visit  Increased blood pressure: She thinks this is from whitecoat syndrome but her monitor at home has not been checked for accuracy  She needs to see her PCP and is overdue for preventive care anyway  OSTEOPOROSIS screening: She has not had any since 2008 She has recently had a humerus fracture from a fall and considering her age and comorbid conditions needs to have a bone density done and this was explained  PLAN:     Since she wants to lose weight and her blood sugars can be more optimal as judged by her fructosamine she will try 3 mg Trulicity instead of 1.5  She will certainly need 1 mg of Prandin at dinnertime if her sugars after dinner are consistently high over if she is having more carbohydrates or higher glycemic foods or drinks Encouraged her to get back into any exercise program either with walking or pool exercises Reminded her to check readings after dinner more  regularly and also more often to help identify what foods are making her sugars go up She will need to consistently avoid regular soft drinks  Also may benefit from reducing wine intake especially with high ALT  Bone density ordered  Follow-up with PCP regarding hypokalemia, may benefit from using Aldactone instead of Lasix Also forwarding liver functions to gastroenterologist  There are no Patient Instructions on file for this visit.       Elayne Snare 12/03/2019, 9:43 AM   Note: This office note was prepared with Dragon voice recognition system technology. Any transcriptional errors that result from this process are unintentional.

## 2019-12-13 ENCOUNTER — Other Ambulatory Visit: Payer: Self-pay | Admitting: Internal Medicine

## 2019-12-20 ENCOUNTER — Other Ambulatory Visit: Payer: Self-pay | Admitting: Endocrinology

## 2020-01-19 ENCOUNTER — Other Ambulatory Visit: Payer: Self-pay | Admitting: Family Medicine

## 2020-01-21 ENCOUNTER — Other Ambulatory Visit: Payer: Self-pay | Admitting: Family Medicine

## 2020-01-21 ENCOUNTER — Other Ambulatory Visit: Payer: Self-pay

## 2020-01-21 DIAGNOSIS — Z760 Encounter for issue of repeat prescription: Secondary | ICD-10-CM

## 2020-01-21 DIAGNOSIS — R52 Pain, unspecified: Secondary | ICD-10-CM

## 2020-01-21 MED ORDER — EFFEXOR XR 75 MG PO CP24: ORAL_CAPSULE | ORAL | 2 refills | Status: DC

## 2020-01-21 NOTE — Telephone Encounter (Signed)
Effexor refilled, form hand signed by Dr Carlean Purl and he wrote on there for brand necessary. Form [facility]ed to [facility] and confirmation it went thru.

## 2020-01-22 NOTE — Telephone Encounter (Signed)
Last filled 06/11/2019 Last OV 08/01/2019  Ok to fill?

## 2020-01-25 ENCOUNTER — Telehealth: Payer: Self-pay | Admitting: Internal Medicine

## 2020-01-25 NOTE — Telephone Encounter (Signed)
Will discuss at OV

## 2020-01-25 NOTE — Telephone Encounter (Signed)
Spoke with patient, she states that her skin is tearing so bad - so thin, easily bruising, she states that bruising has gotten worse, pt states that this has been going on for a while due to budesonide. Based on last labs possibly suggesting a repeat liver biopsy. Scheduled patient for 01/29/20 at 9:30 am to discuss concerns further. Please advise on any other recommendations, thank you

## 2020-01-25 NOTE — Telephone Encounter (Signed)
Pt states that the budesonide has been causing her some side effects including easy bruising and tearing of the skin so she would like to change medications. She said that Dr. Carlean Purl has been wanting to change that medication anyway. Pls call pt.

## 2020-01-29 ENCOUNTER — Ambulatory Visit: Payer: 59 | Admitting: Internal Medicine

## 2020-03-04 ENCOUNTER — Other Ambulatory Visit (INDEPENDENT_AMBULATORY_CARE_PROVIDER_SITE_OTHER): Payer: 59

## 2020-03-04 ENCOUNTER — Other Ambulatory Visit: Payer: Self-pay

## 2020-03-04 DIAGNOSIS — R945 Abnormal results of liver function studies: Secondary | ICD-10-CM

## 2020-03-04 DIAGNOSIS — E1165 Type 2 diabetes mellitus with hyperglycemia: Secondary | ICD-10-CM

## 2020-03-04 DIAGNOSIS — R7989 Other specified abnormal findings of blood chemistry: Secondary | ICD-10-CM

## 2020-03-04 LAB — COMPREHENSIVE METABOLIC PANEL
ALT: 39 U/L — ABNORMAL HIGH (ref 0–35)
AST: 38 U/L — ABNORMAL HIGH (ref 0–37)
Albumin: 4.1 g/dL (ref 3.5–5.2)
Alkaline Phosphatase: 113 U/L (ref 39–117)
BUN: 9 mg/dL (ref 6–23)
CO2: 32 mEq/L (ref 19–32)
Calcium: 9.1 mg/dL (ref 8.4–10.5)
Chloride: 98 mEq/L (ref 96–112)
Creatinine, Ser: 0.55 mg/dL (ref 0.40–1.20)
GFR: 109.96 mL/min (ref >60.00)
Glucose, Bld: 151 mg/dL — ABNORMAL HIGH (ref 70–99)
Potassium: 3.9 mEq/L (ref 3.5–5.1)
Sodium: 138 mEq/L (ref 135–145)
Total Bilirubin: 1.1 mg/dL (ref 0.2–1.2)
Total Protein: 7.8 g/dL (ref 6.0–8.3)

## 2020-03-04 LAB — HEMOGLOBIN A1C: Hgb A1c MFr Bld: 6 % (ref 4.6–6.5)

## 2020-03-05 ENCOUNTER — Other Ambulatory Visit: Payer: Self-pay | Admitting: Endocrinology

## 2020-03-06 ENCOUNTER — Other Ambulatory Visit: Payer: Self-pay

## 2020-03-06 ENCOUNTER — Encounter: Payer: Self-pay | Admitting: Endocrinology

## 2020-03-06 ENCOUNTER — Ambulatory Visit: Payer: 59 | Admitting: Endocrinology

## 2020-03-06 VITALS — BP 132/86 | HR 89 | Ht 66.0 in | Wt 132.0 lb

## 2020-03-06 DIAGNOSIS — E1165 Type 2 diabetes mellitus with hyperglycemia: Secondary | ICD-10-CM | POA: Diagnosis not present

## 2020-03-06 DIAGNOSIS — E7800 Pure hypercholesterolemia, unspecified: Secondary | ICD-10-CM

## 2020-03-06 NOTE — Progress Notes (Signed)
[name], female   DOB: January 22, 1951, 69 y.o.   MRN: [id number]          Reason for Appointment: Follow-up for Type 2 Diabetes    History of Present Illness:          Date of diagnosis of type 2 diabetes mellitus: ?  2013        Background history:   She had a blood sugar over 200 in 2013 but was not told to have diabetes until 04/2015 A1c was 7.3 in 3/16 In 04/2015 blood sugar was 478 says her blood sugar was 600 when she was diagnosed and was not having any significant symptoms Initially treated with Metaglip but this apparently caused abdominal discomfort and she was switched to metformin In 2017 she had been tried on Bydureon for better control but she stopped this after a few weeks because of large persisted nodules in her skin She thinks her blood sugars were better with this and A1c was down to 6.8  Recent history:   Non-insulin hypoglycemic drugs the patient is taking are: Trulicity 3 mg weekly, Prandin 0.5 mg twice daily  Her A1c is usually falsely low and is now 6  Fructosamine last 302 compared to 284 previously  Current management, blood sugar patterns and problems identified:    As before she is checking blood sugar very sporadically and and only once after dinner  She tends to have slightly high fasting readings and has only one early morning reading of 145  Lab glucose was 151 likely fasting at 9:30 AM  She says she is trying to walk very regularly now  Also with increasing Trulicity to 3 mg she has been able to lose about 8 pounds  She feels fairly good and has been regular with her medications  No nausea with Trulicity  Highest blood sugar 187 midday but usually blood sugars are under 150  She tries to take Prandin twice a day before meals and is fairly regular with this  She does not see the dietitian because of cost        Side effects from medications have been: Metaglip caused abdominal pain, Tradjenta caused swelling of the  legs, Bydureon caused skin nodules, Farxiga caused diarrhea, glipizide causes dizziness and weakness, Metformin caused GI side effects, Invokana causes yeast infections  Compliance with the medical regimen: Inconsistent  Glucose monitoring:  done less than 1 times a day         Glucometer: One Verio    Blood Glucose readings by time of day by monitor download:  Recent blood sugar range 113-187, AVERAGE 136 with most readings between 10 AM-2 PM  Previously: 121-164 AVERAGE 135 with only 6 readings   Self-care:  Typical meal intake: Breakfast is Oatmeal, usually eating low-fat dinner and her snacks will be fruit, peanut butter crackers                Dietician visit, most recent: None               Weight history:  Wt Readings from Last 3 Encounters:  03/06/20 132 lb (59.9 kg)  12/03/19 140 lb 3.2 oz (63.6 kg)  08/25/19 139 lb 6 oz (63.2 kg)    Glycemic control:   Lab Results  Component Value Date   HGBA1C 6.0 03/04/2020   HGBA1C 6.4 11/30/2019   HGBA1C 5.8 (A) 06/04/2019   Lab Results  Component Value Date   MICROALBUR 21.6 (H) 12/20/2018  LDLCALC 156 (H) 08/01/2019   CREATININE 0.55 03/04/2020   Lab Results  Component Value Date   MICRALBCREAT 21.3 12/20/2018    Lab Results  Component Value Date   FRUCTOSAMINE 302 (H) 11/30/2019   FRUCTOSAMINE 284 06/04/2019   FRUCTOSAMINE 265 02/27/2019    Lab on 03/04/2020  Component Date Value Ref Range Status  . Sodium 03/04/2020 138  135 - 145 mEq/L Final  . Potassium 03/04/2020 3.9  3.5 - 5.1 mEq/L Final  . Chloride 03/04/2020 98  96 - 112 mEq/L Final  . CO2 03/04/2020 32  19 - 32 mEq/L Final  . Glucose, Bld 03/04/2020 151* 70 - 99 mg/dL Final  . BUN 03/04/2020 9  6 - 23 mg/dL Final  . Creatinine, Ser 03/04/2020 0.55  0.40 - 1.20 mg/dL Final  . Total Bilirubin 03/04/2020 1.1  0.2 - 1.2 mg/dL Final  . Alkaline Phosphatase 03/04/2020 113  39 - 117 U/L Final  . AST 03/04/2020 38* 0 - 37 U/L Final  . ALT 03/04/2020  39* 0 - 35 U/L Final  . Total Protein 03/04/2020 7.8  6.0 - 8.3 g/dL Final  . Albumin 03/04/2020 4.1  3.5 - 5.2 g/dL Final  . GFR 03/04/2020 109.96  >60.00 mL/min Final  . Calcium 03/04/2020 9.1  8.4 - 10.5 mg/dL Final  . Hgb A1c MFr Bld 03/04/2020 6.0  4.6 - 6.5 % Final   Glycemic Control Guidelines for People with Diabetes:Non Diabetic:  <6%Goal of Therapy: <7%Additional Action Suggested:  >8%      Allergies as of 03/06/2020      Reactions   Fentanyl Anxiety   Made her feel very anxious, requests not to get it.   Levofloxacin Anxiety   Weird dreams    Prednisone    Anxiety and paranoia   Amoxicillin Diarrhea   Has patient had a PCN reaction causing immediate rash, facial/tongue/throat swelling, SOB or lightheadedness with hypotension: No Has patient had a PCN reaction causing severe rash involving mucus membranes or skin necrosis: No Has patient had a PCN reaction that required hospitalization: No Has patient had a PCN reaction occurring within the last 10 years: Yes If all of the above answers are "NO", then may proceed with Cephalosporin use.   Augmentin [amoxicillin-pot Clavulanate] Other (See Comments)   diarrhea   Farxiga [dapagliflozin] Diarrhea   Gi pain   Statins    Myalgia       Medication List       Accurate as of March 06, 2020 11:59 PM. If you have any questions, ask your nurse or doctor.        STOP taking these medications   oxyCODONE-acetaminophen 5-325 MG tablet Commonly known as: PERCOCET/ROXICET Stopped by: Elayne Snare, MD     TAKE these medications   ALPRAZolam 0.5 MG tablet Commonly known as: XANAX TAKE 1 TABLET(0.5 MG) BY MOUTH AT BEDTIME AS NEEDED FOR SLEEP   aspirin 325 MG tablet Take 650-975 mg by mouth daily as needed for moderate pain or headache.   azelastine 0.05 % ophthalmic solution Commonly known as: OPTIVAR INSTILL 1 DROP IN AFFECTED EYE(S) DAILY   budesonide 3 MG 24 hr capsule Commonly known as: ENTOCORT EC TAKE 3  CAPSULES(9 MG) BY MOUTH DAILY   cyclobenzaprine 10 MG tablet Commonly known as: FLEXERIL TAKE 1 TABLET BY MOUTH EVERY 8 HOURS AS NEEDED FOR MUSCLE SPASMS WITH ONE BEING AT BEDTIME IF NEEDED   diphenhydrAMINE 25 mg capsule Commonly known as: BENADRYL Take 25-50 mg by  mouth daily as needed for allergies.   diphenhydrAMINE-zinc acetate cream Commonly known as: BENADRYL Apply 1 application topically daily as needed for itching.   Effexor XR 75 MG 24 hr capsule Generic drug: venlafaxine XR TAKE 1 CAPSULE BY MOUTH TWICE DAILY WITH A MEAL   ezetimibe 10 MG tablet Commonly known as: ZETIA TAKE 1 TABLET(10 MG) BY MOUTH DAILY   ferrous gluconate 324 MG tablet Commonly known as: FERGON Take 324 mg by mouth daily.   Fluticasone-Salmeterol 100-50 MCG/DOSE Aepb Commonly known as: Advair Diskus Inhale 1 puff into the lungs 2 (two) times daily.   furosemide 20 MG tablet Commonly known as: LASIX TAKE 1 TABLET(20 MG) BY MOUTH DAILY AS NEEDED   glucose blood test strip Use Onetouch verio test strips as instructed to check blood sugar once daily.   Hair Skin and Nails Formula Tabs Take 1 tablet by mouth daily.   lansoprazole 30 MG capsule Commonly known as: PREVACID TAKE 1 CAPSULE(30 MG) BY MOUTH DAILY   montelukast 10 MG tablet Commonly known as: SINGULAIR TAKE 1 TABLET BY MOUTH AT BEDTIME   OneTouch Delica Plus [id number] Misc USE AS DIRECTED UP TO 4 TIMES DAILY AS DIRECTED   OneTouch Verio Flex System w/Device Kit Use Onetouch Verio Flex to check blood sugar once daily.   phenylephrine 10 MG Tabs tablet Commonly known as: SUDAFED PE Take 10 mg by mouth every 4 (four) hours as needed.   promethazine 12.5 MG tablet Commonly known as: PHENERGAN Take 1 tablet (12.5 mg total) by mouth every 8 (eight) hours as needed for nausea or vomiting.   pseudoephedrine 30 MG tablet Commonly known as: SUDAFED Take 30 mg by mouth daily as needed for congestion.   repaglinide 0.5 MG  tablet Commonly known as: PRANDIN TAKE 1 TABLET(0.5 MG) BY MOUTH TWICE DAILY BEFORE A MEAL   sodium chloride inhaler solution Commonly known as: BRONCHO SALINE Take 1 spray by nebulization as needed.   Trulicity 3 JJ/8.8CZ Sopn Generic drug: Dulaglutide INJECT CONTENTS OF ONE SYRINGE($RemoveBefore'3MG'BkxyrOEzktNMU$ ) UNDER THE SKIN ONCE WEEKLY       Allergies:  Allergies  Allergen Reactions  . Fentanyl Anxiety    Made her feel very anxious, requests not to get it.  . Levofloxacin Anxiety    Weird dreams   . Prednisone     Anxiety and paranoia  . Amoxicillin Diarrhea    Has patient had a PCN reaction causing immediate rash, facial/tongue/throat swelling, SOB or lightheadedness with hypotension: No Has patient had a PCN reaction causing severe rash involving mucus membranes or skin necrosis: No Has patient had a PCN reaction that required hospitalization: No Has patient had a PCN reaction occurring within the last 10 years: Yes If all of the above answers are "NO", then may proceed with Cephalosporin use.   . Augmentin [Amoxicillin-Pot Clavulanate] Other (See Comments)    diarrhea  . Wilder Glade [Dapagliflozin] Diarrhea    Gi pain  . Statins     Myalgia     Past Medical History:  Diagnosis Date  . Allergy   . Anxiety   . Asthma   . Autoimmune hepatitis ([place]) 08/18/2016   08/2016 liver bx confirms, fibrosis not cirrhosis  . Depression   . DM (diabetes mellitus) ([place])   . 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 GERD (gastroesophageal reflux disease)   . Hiatal hernia   . Hx of adenomatous polyp of colon 07/07/2009  . Hyperlipidemia   . Hypertension   . IBS (irritable bowel  syndrome)   . Iron deficiency anemia   . 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 Sinusitis, chronic   . Thyroid nodule   . Umbilical hernia   . Uterine fibroid   . Vascular disease   . Vitamin D deficiency     Past Surgical History:  Procedure Laterality Date  . BREAST  EXCISIONAL BIOPSY Left   . BREAST EXCISIONAL BIOPSY Right   . CATARACT EXTRACTION Bilateral   . COLONOSCOPY  05/23/2014   per Dr. Carlean Purl, adenimatous polyps, repeat in 7 yrs   . ESOPHAGOGASTRODUODENOSCOPY  02/2002  . ESOPHAGOGASTRODUODENOSCOPY (EGD) WITH PROPOFOL N/A 06/20/2017   Procedure: ESOPHAGOGASTRODUODENOSCOPY (EGD) WITH PROPOFOL;  Surgeon: Gatha Mayer, MD;  Location: [facility];  Service: Endoscopy;  Laterality: N/A;  with Barrx  . EYE SURGERY    . NASAL SEPTOPLASTY W/ TURBINOPLASTY    . TONSILLECTOMY      Family History  Problem Relation Age of Onset  . Stroke Mother   . Hypertension Mother   . Colon cancer Father 32  . Liver cancer Father   . Heart disease Sister        hole in heart  . Stroke Maternal Grandmother   . Hypertension Maternal Grandmother   . Esophageal cancer Maternal Grandfather   . Breast cancer Paternal Grandmother   . Brain cancer Paternal Grandmother   . Lung cancer Paternal Grandfather   . Diabetes Neg Hx     Social History:  reports that she has never smoked. She has never used smokeless tobacco. She reports current alcohol use. She reports that she does not use drugs.   Review of Systems   Lipid history: She has persistent hypercholesterolemia Highest baseline LDL about 170  Previously had tried Zocor which apparently caused muscle aches, she took Crestor and she thinks generic causes muscle cramps She took Livalo for a couple of days but she thinks she didn't feel well with this and had some palpitations reportedly  She has been prescribed Zetia but still has inadequate control   Lab Results  Component Value Date   CHOL 236 (H) 08/01/2019   CHOL 219 (H) 12/20/2018   CHOL 248 (H) 05/08/2018   Lab Results  Component Value Date   HDL 59.40 08/01/2019   HDL 56.30 12/20/2018   HDL 52.50 05/08/2018   Lab Results  Component Value Date   LDLCALC 156 (H) 08/01/2019   LDLCALC 143 (H) 12/20/2018   LDLCALC 170 (H) 05/08/2018   Lab  Results  Component Value Date   TRIG 104.0 08/01/2019   TRIG 101.0 12/20/2018   TRIG 128.0 05/08/2018   Lab Results  Component Value Date   CHOLHDL 4 08/01/2019   CHOLHDL 4 12/20/2018   CHOLHDL 5 05/08/2018   Lab Results  Component Value Date   LDLDIRECT 153.0 02/27/2019           Lab Results  Component Value Date   ALT 39 (H) 03/04/2020    Hypertension: She thinks she has whitecoat syndrome and her readings are better at home Has not compared home monitor to the office reading   BP Readings from Last 3 Encounters:  03/06/20 132/86  12/03/19 (!) 150/90  08/25/19 (!) 153/87    She takes Lasix as before for complaints of having swelling in her abdomen Potassium has improved  Lab Results  Component Value Date   K 3.9 03/04/2020     Most recent eye exam was 10/20  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 Sodium 03/04/2020 138  135 - 145 mEq/L Final  . Potassium 03/04/2020 3.9  3.5 - 5.1 mEq/L Final  . Chloride 03/04/2020 98  96 - 112 mEq/L Final  . CO2 03/04/2020 32  19 - 32 mEq/L Final  . Glucose, Bld 03/04/2020 151* 70 - 99 mg/dL Final  . BUN 03/04/2020 9  6 - 23 mg/dL Final  . Creatinine, Ser 03/04/2020 0.55  0.40 - 1.20 mg/dL Final  . Total Bilirubin 03/04/2020 1.1  0.2 - 1.2 mg/dL Final  . Alkaline Phosphatase 03/04/2020 113  39 - 117 U/L Final  . AST 03/04/2020 38* 0 - 37 U/L Final  . ALT 03/04/2020 39* 0 - 35 U/L Final  . Total Protein 03/04/2020 7.8  6.0 - 8.3 g/dL Final  . Albumin 03/04/2020 4.1  3.5 - 5.2 g/dL Final  . GFR 03/04/2020 109.96  >60.00 mL/min Final  . Calcium 03/04/2020 9.1  8.4 - 10.5 mg/dL Final  . Hgb A1c MFr Bld 03/04/2020 6.0  4.6 -  6.5 % Final   Glycemic Control Guidelines for People with Diabetes:Non Diabetic:  <6%Goal of Therapy: <7%Additional Action Suggested:  >8%     Physical Examination:  BP 132/86   Pulse 89   Ht $R'5\' 6"'dj$  (1.676 m)   Wt 132 lb (59.9 kg)   SpO2 98%   BMI 21.31 kg/m       ASSESSMENT:  Diabetes type 2, nonobese  See history of present illness for detailed discussion of current diabetes management, blood sugar patterns and problems identified  A1c has been usually lower than expected, now 6, was 5.8  She has only 8 readings in the last month at home She tends to have relatively higher fasting readings and has not done readings in the early morning except once Likely if her blood sugars are consistently controlled after dinner also and has only one good reading at bedtime She does not check readings at night because of eating late dinners  Now with increasing her Trulicity and walking regularly she has lost 8 pounds  Lipids: Will need follow-up in the next visit  PLAN:     No change in her Prandin and Trulicity Check blood sugars more consistently after meals and also on waking up by rotation To call if blood sugars are consistently high Also may consider adding a dose of Prandin at bedtime if fasting readings are mostly high  Patient Instructions  Check blood sugars on waking up 3 days a week  Also check blood sugars about 2 hours after meals and do this after different meals by rotation  Recommended blood sugar levels on waking up are 90-130 and about 2 hours after meal is 130-160  Please bring your blood sugar monitor to each visit, thank you          Elayne Snare 03/07/2020, 3:00 PM   Note: This office note was prepared with Dragon voice recognition system technology. Any transcriptional errors that result from this process are unintentional.

## 2020-03-06 NOTE — Patient Instructions (Signed)
Check blood sugars on waking up 3 days a week  Also check blood sugars about 2 hours after meals and do this after different meals by rotation  Recommended blood sugar levels on waking up are 90-130 and about 2 hours after meal is 130-160  Please bring your blood sugar monitor to each visit, thank you   

## 2020-03-10 ENCOUNTER — Telehealth: Payer: Self-pay

## 2020-03-10 NOTE — Telephone Encounter (Signed)
-----   Message from Elayne Snare, MD sent at 03/07/2020  3:01 PM EDT ----- Regarding: Bone density Please have her schedule bone density, this was ordered in June

## 2020-03-10 NOTE — Telephone Encounter (Signed)
Spoke with patient regarding bone density.  She is going to delay and talk to her GYN for there opinion.  Patient says her breaking her bone has nothing to do with diabetes and she doesn't feel she needs a bone density but will follow up with GYN.

## 2020-03-19 ENCOUNTER — Other Ambulatory Visit: Payer: Self-pay | Admitting: Endocrinology

## 2020-04-09 ENCOUNTER — Telehealth: Payer: Self-pay | Admitting: Internal Medicine

## 2020-04-09 ENCOUNTER — Other Ambulatory Visit: Payer: Self-pay | Admitting: Family Medicine

## 2020-04-09 DIAGNOSIS — Z1231 Encounter for screening mammogram for malignant neoplasm of breast: Secondary | ICD-10-CM

## 2020-04-09 NOTE — Telephone Encounter (Signed)
Patient called to advise she stop the Enticort medication due to an allergic reaction she had about two weeks ago

## 2020-04-10 NOTE — Telephone Encounter (Signed)
Left message for patient to call back  

## 2020-04-10 NOTE — Telephone Encounter (Signed)
Patient reports that she has had a reaction to entocort.  She reports she developed hives and stopped it about 2 weeks ago.  She reports " I feel The best I have ever felt".  She has an office visit scheduled with you for Nov.  She wants to discuss you ordering liver labs and autoimmune labs at that time.  She would like to schedule her colon for this year before the end of the year.  Chart has the recall changed to OV.  We tentatively scheduled colon for 12/17 and I let her know you and she can discuss further at that appt and can be cancelled if not due.    I did not mark entocort as an allergy at this time.

## 2020-04-21 ENCOUNTER — Ambulatory Visit: Payer: 59 | Admitting: Obstetrics & Gynecology

## 2020-04-21 ENCOUNTER — Encounter: Payer: Self-pay | Admitting: Obstetrics & Gynecology

## 2020-04-21 ENCOUNTER — Other Ambulatory Visit: Payer: Self-pay

## 2020-04-21 ENCOUNTER — Other Ambulatory Visit: Payer: Self-pay | Admitting: Internal Medicine

## 2020-04-21 VITALS — BP 130/78 | Ht 65.0 in | Wt 131.6 lb

## 2020-04-21 DIAGNOSIS — R87810 Cervical high risk human papillomavirus (HPV) DNA test positive: Secondary | ICD-10-CM

## 2020-04-21 DIAGNOSIS — R52 Pain, unspecified: Secondary | ICD-10-CM

## 2020-04-21 DIAGNOSIS — Z113 Encounter for screening for infections with a predominantly sexual mode of transmission: Secondary | ICD-10-CM

## 2020-04-21 NOTE — Addendum Note (Signed)
Addended by: Thurnell Garbe A on: 04/21/2020 12:00 PM   Modules accepted: Orders

## 2020-04-21 NOTE — Progress Notes (Signed)
    Miranda Cochran Feb 24, 1951 [id number]        69 y.o.  G0  New boyfriend x 12/2019  RP: Pap Negative/HPV HR Positive in 07/2019  HPI: Postmenopause, well on no HRT.  No PMB.  No pelvic pain.  Sexually active with a new boyfriend x 12/2019.  No abnormal vaginal d/c.  No fever.  Normal Paps in the past.  Last Pap 07/2019 Negative, but HPV HR Positive.  No STI screen done.   OB History  Gravida Para Term Preterm AB Living  0 0 0 0 0 0  SAB TAB Ectopic Multiple Live Births  0 0 0 0 0    Past medical history,surgical history, problem list, medications, allergies, family history and social history were all reviewed and documented in the EPIC chart.   Directed ROS with pertinent positives and negatives documented in the history of present illness/assessment and plan.  Exam:  Vitals:   04/21/20 0959  BP: 130/78  Weight: 131 lb 9.6 oz (59.7 kg)  Height: 5\' 5"  (1.651 m)   General appearance:  Normal  Gynecologic exam: Vulva Normal.  Speculum:  Cervix normal, vagina normal, normal secretions.  Pap/Gono-Chlam done.  HPV 16-18-45 done.   Assessment/Plan:  69 y.o. G0  1. 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  No previous abnormal Pap test or cervical treatment.  New partner since July 2021.  HPV 16-18-45 done today.  Pap test repeated and gonorrhea chlamydia tested.  Counseling on positive high-risk HPV and the risks of abnormal Pap tests, dysplasia and cervical cancer done.   Management per results, will do colposcopy if HPV 16-18-45 are positive or if Pap test is abnormal.   2. Screen for STD (sexually transmitted disease) Gonorrhea and Chlamydia done on the Pap test today.  Other orders - PAP,TP IMGw/HPV RNA,rflx [id number],16/96 - C. trachomatis/N. gonorrhoeae RNA  [name], 10:13 AM 04/21/2020

## 2020-04-22 ENCOUNTER — Ambulatory Visit: Payer: 59 | Admitting: Obstetrics & Gynecology

## 2020-04-23 LAB — HPV TYPE 16 AND 18/45 RNA
HPV Type 16 RNA: NOT DETECTED
HPV Type 18/45 RNA: NOT DETECTED

## 2020-04-24 LAB — PAP THINPREP ASCUS RFLX HPV RFLX TYPE
C. trachomatis RNA, TMA: NOT DETECTED
N. gonorrhoeae RNA, TMA: NOT DETECTED

## 2020-05-02 ENCOUNTER — Other Ambulatory Visit: Payer: Self-pay | Admitting: Endocrinology

## 2020-05-05 LAB — HM DIABETES EYE EXAM

## 2020-05-06 ENCOUNTER — Encounter: Payer: Self-pay | Admitting: Internal Medicine

## 2020-05-06 ENCOUNTER — Ambulatory Visit: Payer: 59 | Admitting: Internal Medicine

## 2020-05-06 ENCOUNTER — Other Ambulatory Visit (INDEPENDENT_AMBULATORY_CARE_PROVIDER_SITE_OTHER): Payer: 59

## 2020-05-06 ENCOUNTER — Telehealth: Payer: Self-pay | Admitting: Internal Medicine

## 2020-05-06 VITALS — BP 142/92 | HR 80 | Ht 65.0 in | Wt 132.0 lb

## 2020-05-06 DIAGNOSIS — K754 Autoimmune hepatitis: Secondary | ICD-10-CM

## 2020-05-06 DIAGNOSIS — Z800 Family history of malignant neoplasm of digestive organs: Secondary | ICD-10-CM

## 2020-05-06 DIAGNOSIS — K219 Gastro-esophageal reflux disease without esophagitis: Secondary | ICD-10-CM | POA: Diagnosis not present

## 2020-05-06 DIAGNOSIS — Z86010 Personal history of colonic polyps: Secondary | ICD-10-CM

## 2020-05-06 DIAGNOSIS — T50905A Adverse effect of unspecified drugs, medicaments and biological substances, initial encounter: Secondary | ICD-10-CM

## 2020-05-06 DIAGNOSIS — K588 Other irritable bowel syndrome: Secondary | ICD-10-CM | POA: Diagnosis not present

## 2020-05-06 DIAGNOSIS — D500 Iron deficiency anemia secondary to blood loss (chronic): Secondary | ICD-10-CM | POA: Diagnosis not present

## 2020-05-06 LAB — HEPATIC FUNCTION PANEL
ALT: 60 U/L — ABNORMAL HIGH (ref 0–35)
AST: 73 U/L — ABNORMAL HIGH (ref 0–37)
Albumin: 4 g/dL (ref 3.5–5.2)
Alkaline Phosphatase: 105 U/L (ref 39–117)
Bilirubin, Direct: 0.3 mg/dL (ref 0.0–0.3)
Total Bilirubin: 1.2 mg/dL (ref 0.2–1.2)
Total Protein: 8.1 g/dL (ref 6.0–8.3)

## 2020-05-06 MED ORDER — PROMETHAZINE HCL 25 MG PO TABS: 25.0000 mg | ORAL_TABLET | Freq: Three times a day (TID) | ORAL | 1 refills | Status: DC | PRN

## 2020-05-06 MED ORDER — LANSOPRAZOLE 30 MG PO CPDR: DELAYED_RELEASE_CAPSULE | ORAL | 3 refills | Status: DC

## 2020-05-06 NOTE — Assessment & Plan Note (Signed)
She has done well over time without repeat ablation or parenteral iron but I need to recheck CBC and ferritin at some point in the near future.  I did not do this today.

## 2020-05-06 NOTE — Telephone Encounter (Signed)
Please advise Lessie Dings, looks like only her Hepatic panel is back.

## 2020-05-06 NOTE — Assessment & Plan Note (Signed)
Not as much of a problem on the Effexor.  I advised that withdrawal the Effexor could cause a flare of anxiety/depression and her IBS and recommended she continue it.  She could continue just taking it daily rather than twice daily if she is doing well on that.

## 2020-05-06 NOTE — Progress Notes (Signed)
Miranda Cochran 69 y.o. [birth year] [id number]  Assessment & Plan:   Encounter Diagnoses  Name Primary?  . Autoimmune hepatitis treated with steroids ([place]) Yes  . 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 Medication side effect, initial encounter   . Other irritable bowel syndrome   . Gastroesophageal reflux disease   . Family history of colon cancer in father   . Hx of adenomatous polyp of colon       Autoimmune hepatitis treated with steroids ([place]) She stopped budesonide due to perceived side effects.  I think they might be real i.e. from the budesonide.  She is untreated with respect to her autoimmune hepatitis at this point.  She reminds me that she used to drink a lot more wine than she does now.  She is currently drinking "a couple glasses of red wine on the weekends".  I reminded her that abstinence from alcohol in the setting of chronic liver disease is best and that is what I recommend except for the rare small drink on a occasion like a wedding or New Year's..  I think she needs another liver biopsy but she is not ready to do that.  In the past I had wanted her to take azathioprine but she declined that because she was concerned that it would "block my whole immune system".  I explained to her that it only blocks part of it.  I had meant to give her some more information about azathioprine but neglected to do so today.  She desires a recheck of "all the tests I did when I first diagnosed her".  I will repeat her autoimmune markers at her request and her liver chemistries and an IgG level.  We will contact her when those results are in.  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  I did not do this today.  Family history of colon cancer in father dx 5 and died 41 w/ second cancer Colonoscopy  will be done next month.  Instructions provided today.  She already had the appointment.  She has a history of adenomatous polyps as well.  IRRITABLE BOWEL SYNDROME Not as much of a problem on the Effexor.  I advised that withdrawal the Effexor could cause a flare of anxiety/depression and her IBS and recommended she continue it.  She could continue just taking it daily rather than twice daily if she is doing well on that.  Lansoprazole and promethazine refilled for the patient today as well with respect to GERD and as needed nausea treatment  Subjective:   Chief Complaint: Autoimmune hepatitis, reaction to budesonide  HPI Miranda Cochran is here for follow-up of her autoimmune hepatitis previously treated with budesonide.  She also has a history of IBS, family history of colon cancer history of adenomatous colon polyps, and diabetes mellitus followed by Dr. Dwyane Dee.  She recently stopped her budesonide because of hive-like rashes, she shows me images, over her body.  She feels less bloated since then as well.  Also says her discolored feet returned to normal color.  In general she feels better off the medication.  Wondering about what the next steps would be.  She had declined the use of azathioprine previously.  She was concerned that "it would shut down my whole immune system".  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  She has a history of IBS and anxiety depression that have been helped by Effexor.  She wonders about coming off Effexor.  I explained that that needs to be tapered.  She is taking it only once a day most days now.  She reports that she is drinking a couple of glasses of red wine on the weekends.  Used to drink much heavier.   Wt Readings from Last 3 Encounters:  05/06/20 132 lb (59.9 kg)  04/21/20 131 lb 9.6 oz  (59.7 kg)  03/06/20 132 lb (59.9 kg)    Allergies  Allergen Reactions  . Fentanyl Anxiety    Made her feel very anxious, requests not to get it.  . Levofloxacin Anxiety    Weird dreams   . Prednisone     Anxiety and paranoia  . Amoxicillin Diarrhea    Has patient had a PCN reaction causing immediate rash, facial/tongue/throat swelling, SOB or lightheadedness with hypotension: No Has patient had a PCN reaction causing severe rash involving mucus membranes or skin necrosis: No Has patient had a PCN reaction that required hospitalization: No Has patient had a PCN reaction occurring within the last 10 years: Yes If all of the above answers are "NO", then may proceed with Cephalosporin use.   . Augmentin [Amoxicillin-Pot Clavulanate] Other (See Comments)    diarrhea  . Wilder Glade [Dapagliflozin] Diarrhea    Gi pain  . Statins     Myalgia    Current Meds  Medication Sig  . ALPRAZolam (XANAX) 0.5 MG tablet TAKE 1 TABLET(0.5 MG) BY MOUTH AT BEDTIME AS NEEDED FOR SLEEP  . aspirin 325 MG tablet Take 650-975 mg by mouth daily as needed for moderate pain or headache.   . Blood Glucose Monitoring Suppl (ONETOUCH VERIO FLEX SYSTEM) w/Device KIT Use Onetouch Verio Flex to check blood sugar once daily.  . cyclobenzaprine (FLEXERIL) 10 MG tablet TAKE 1 TABLET BY MOUTH EVERY 8 HOURS AS NEEDED FOR MUSCLE SPASMS WITH ONE BEING AT BEDTIME IF NEEDED  . diphenhydrAMINE (BENADRYL) 25 mg capsule Take 25-50 mg by mouth daily as needed for allergies.   . diphenhydrAMINE-zinc acetate (BENADRYL) cream Apply 1 application topically daily as needed for itching.  . EFFEXOR XR 75 MG 24 hr capsule TAKE 1 CAPSULE BY MOUTH TWICE DAILY WITH A MEAL  . ezetimibe (ZETIA) 10 MG tablet TAKE 1 TABLET(10 MG) BY MOUTH DAILY  . ferrous gluconate (FERGON) 324 MG tablet Take 324 mg by mouth daily.  . Fluticasone-Salmeterol (ADVAIR DISKUS) 100-50 MCG/DOSE AEPB Inhale 1 puff into the lungs 2 (two) times daily.  . furosemide  (LASIX) 20 MG tablet TAKE 1 TABLET(20 MG) BY MOUTH DAILY AS NEEDED  . glucose blood test strip Use Onetouch verio test strips as instructed to check blood sugar once daily.  . Lancets (ONETOUCH DELICA PLUS [id number]) MISC USE AS DIRECTED UP TO 4 TIMES DAILY AS DIRECTED  . lansoprazole (PREVACID) 30 MG capsule TAKE 1 CAPSULE(30 MG) BY MOUTH DAILY  . montelukast (SINGULAIR) 10 MG tablet TAKE 1 TABLET BY MOUTH AT BEDTIME  . Multiple Vitamins-Minerals (HAIR SKIN AND NAILS FORMULA) TABS Take 1 tablet by mouth daily.  . phenylephrine (SUDAFED PE) 10 MG TABS tablet Take 10 mg by mouth every 4 (four) hours as needed.  . pseudoephedrine (SUDAFED) 30 MG tablet Take 30  mg by mouth daily as needed for congestion.   . repaglinide (PRANDIN) 0.5 MG tablet TAKE 1 TABLET(0.5 MG) BY MOUTH TWICE DAILY BEFORE A MEAL  . sodium chloride (BRONCHO SALINE) inhaler solution Take 1 spray by nebulization as needed.  . TRULICITY 3 KC/1.2XN SOPN INJECT CONTENTS OF ONE SYRINGE(3MG) UNDER THE SKIN ONCE WEEKLY  . [DISCONTINUED] lansoprazole (PREVACID) 30 MG capsule TAKE 1 CAPSULE(30 MG) BY MOUTH DAILY   Past Medical History:  Diagnosis Date  . Allergy   . Anxiety   . Asthma   . Autoimmune hepatitis ([place]) 08/18/2016   08/2016 liver bx confirms, fibrosis not cirrhosis  . Depression   . DM (diabetes mellitus) ([place])   . 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 GERD (gastroesophageal reflux disease)   . Hiatal hernia   . Hx of adenomatous polyp of colon 07/07/2009  . Hyperlipidemia   . Hypertension   . IBS (irritable bowel syndrome)   . Iron deficiency anemia   . 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 Sinusitis, chronic   . Thyroid nodule   . Umbilical hernia   . Uterine fibroid   . Vascular disease   . Vitamin D deficiency    Past Surgical History:  Procedure Laterality Date  . BREAST EXCISIONAL BIOPSY Left   . BREAST EXCISIONAL BIOPSY Right   . CATARACT EXTRACTION  Bilateral   . COLONOSCOPY  05/23/2014   per Dr. Carlean Purl, adenimatous polyps, repeat in 7 yrs   . ESOPHAGOGASTRODUODENOSCOPY  02/2002  . ESOPHAGOGASTRODUODENOSCOPY (EGD) WITH PROPOFOL N/A 06/20/2017   Procedure: ESOPHAGOGASTRODUODENOSCOPY (EGD) WITH PROPOFOL;  Surgeon: Gatha Mayer, MD;  Location: [facility];  Service: Endoscopy;  Laterality: N/A;  with Barrx  . EYE SURGERY    . NASAL SEPTOPLASTY W/ TURBINOPLASTY    . 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   Review of Systems As per HPI  Objective:   Physical Exam BP (!) 142/92 (BP Location: Left Arm, Patient Position: Sitting, Cuff Size: Normal)   Pulse 80   Ht _0  (1.651 m)   Wt 132 lb (59.9 kg)   SpO2 98%   BMI 21.97 kg/m  Well-developed well-nourished no acute distress Eyes are anicteric The abdomen is soft nontender without organomegaly or mass and I do not see stigmata of chronic liver disease

## 2020-05-06 NOTE — Assessment & Plan Note (Signed)
Colonoscopy will be done next month.  Instructions provided today.  She already had the appointment.  She has a history of adenomatous polyps as well.

## 2020-05-06 NOTE — Patient Instructions (Addendum)
We have sent the following medications to your pharmacy for you to pick up at your convenience: Promethazine, and Lansoprazole  You have been scheduled for a colonoscopy. Please follow written instructions given to you at your visit today.  Please pick up your prep supplies at the pharmacy within the next 1-3 days. If you use inhalers (even only as needed), please bring them with you on the day of your procedure.   Your provider has requested that you go to the basement level for lab work before leaving today. Press "B" on the elevator. The lab is located at the first door on the left as you exit the elevator.   Due to recent changes in healthcare laws, you may see the results of your imaging and laboratory studies on MyChart before your provider has had a chance to review them.  We understand that in some cases there may be results that are confusing or concerning to you. Not all laboratory results come back in the same time frame and the provider may be waiting for multiple results in order to interpret others.  Please give [place] 48 hours in order for your provider to thoroughly review all the results before contacting the office for clarification of your results.   Normal BMI (Body Mass Index- based on height and weight) is between 23 and 30. Your BMI today is Body mass index is 21.97 kg/m. Marland Kitchen Please consider follow up  regarding your BMI with your Primary Care Provider.   I appreciate the opportunity to care for you. Silvano Rusk, MD, [facility]

## 2020-05-06 NOTE — Assessment & Plan Note (Addendum)
She stopped budesonide due to perceived side effects.  I think they might be real i.e. from the budesonide.  She is untreated with respect to her autoimmune hepatitis at this point.  She reminds me that she used to drink a lot more wine than she does now.  She is currently drinking "a couple glasses of red wine on the weekends".  I reminded her that abstinence from alcohol in the setting of chronic liver disease is best and that is what I recommend except for the rare small drink on a occasion like a wedding or New Year's..  I think she needs another liver biopsy but she is not ready to do that.  In the past I had wanted her to take azathioprine but she declined that because she was concerned that it would "block my whole immune system".  I explained to her that it only blocks part of it.  I had meant to give her some more information about azathioprine but neglected to do so today.  She desires a recheck of "all the tests I did when I first diagnosed her".  I will repeat her autoimmune markers at her request and her liver chemistries and an IgG level.  We will contact her when those results are in.

## 2020-05-07 NOTE — Telephone Encounter (Signed)
See result note I cced you and sent to her

## 2020-05-08 LAB — IGG: IgG (Immunoglobin G), Serum: 2004 mg/dL — ABNORMAL HIGH (ref 600–1540)

## 2020-05-08 LAB — ANTI-SMOOTH MUSCLE ANTIBODY, IGG: Actin (Smooth Muscle) Antibody (IGG): 58 U — ABNORMAL HIGH (ref <20)

## 2020-05-12 ENCOUNTER — Telehealth: Payer: Self-pay

## 2020-05-12 ENCOUNTER — Encounter: Payer: Self-pay | Admitting: *Deleted

## 2020-05-12 ENCOUNTER — Telehealth: Payer: Self-pay | Admitting: Internal Medicine

## 2020-05-12 NOTE — Telephone Encounter (Signed)
I have started a prior authorization for patients brand name effexor thru [place]. The generic is ineffective.

## 2020-05-12 NOTE — Telephone Encounter (Signed)
Please explain the following:  Her situation is not straightforward and I think it would be useful to get some help from a liver specialist.  I thought of that as I was going over things after her visit and looking at the results.  Regarding azathioprine, it is not typically used alone it is usually given with prednisone or perhaps budesonide.  I think she may need to take prednisone and the azathioprine with hopes of getting off prednisone as I have thought about her situation more.  I would like some help and advice from a liver expert to provide the best treatment.  Her underlying diabetes makes the use of prednisone tricky.  As a reminder to our conversation at her most recent office visit, it is imperative that she not drink alcohol as that can significantly increase her liver damage risks.  If I was going to do any 1 thing with her right now I would have her do a liver biopsy again so we can accurately understand the condition of the liver.   I am happy to discuss more when she comes for her colonoscopy on December 17.  I understand she may be concerned about her liver problems and that is appropriate but we have some time to sort through this I believe.

## 2020-05-12 NOTE — Telephone Encounter (Signed)
I spoke with the patient and explained your response.  She refuses to consider prednisone.  " I can't tolerate it.  I won't take it".  Patient wants to try azathioprine alone without budesonide or the prednisone.  "Because sometimes less is better".  "Just tell him to let me try it".

## 2020-05-12 NOTE — Telephone Encounter (Signed)
Please advise Sig and I will send this in Sir, thank you.

## 2020-05-13 MED ORDER — AZATHIOPRINE 50 MG PO TABS: 50.0000 mg | ORAL_TABLET | Freq: Every day | ORAL | 0 refills | Status: DC

## 2020-05-13 NOTE — Telephone Encounter (Signed)
I got an email that Miranda Cochran's brand name effexor has been approved.

## 2020-05-13 NOTE — Telephone Encounter (Signed)
Patient notified rx sent 

## 2020-05-13 NOTE — Telephone Encounter (Signed)
Tell her I discussed with liver clinic instead of sending her there so can cancel the referral   Next steps are:  Azathioprine 50 mg daily # 30 no refill  I will get repeat labs on her when she comes for her colonoscopy - explain we will need to do labs on a regular basis

## 2020-05-16 LAB — ANA TITER AND PATTERN: Homogeneous Pattern: 1:160 {titer} — ABNORMAL HIGH

## 2020-05-16 LAB — ANTI-NUCLEAR AB BY IFA (RDL): Anti-Nuclear Ab by IFA (RDL): POSITIVE — AB

## 2020-05-19 ENCOUNTER — Encounter: Payer: Self-pay | Admitting: Family Medicine

## 2020-05-19 ENCOUNTER — Telehealth (INDEPENDENT_AMBULATORY_CARE_PROVIDER_SITE_OTHER): Payer: 59 | Admitting: Family Medicine

## 2020-05-19 DIAGNOSIS — J019 Acute sinusitis, unspecified: Secondary | ICD-10-CM | POA: Diagnosis not present

## 2020-05-19 MED ORDER — AZITHROMYCIN 250 MG PO TABS: ORAL_TABLET | ORAL | 0 refills | Status: DC

## 2020-05-19 NOTE — Progress Notes (Signed)
   Subjective:    [name], female    DOB: Jun 24, 1950, 69 y.o.   MRN: [id number]  HPI Virtual Visit via Telephone Note  I connected with the patient on 05/19/20 at 10:15 AM EST by telephone and verified that I am speaking with the correct person using two identifiers.   I discussed the limitations, risks, security and privacy concerns of performing an evaluation and management service by telephone and the availability of in person appointments. I also discussed with the patient that there may be a patient responsible charge related to this service. The patient expressed understanding and agreed to proceed.  Location patient: home Location provider: work or home office Participants present for the call: patient, provider Patient did not have a visit in the prior 7 days to address this/these issue(s).   History of Present Illness: Here for one week of sinus congestion, blowing yellow mucus from the nose,and a dry cough. No fever. She tested negative for the Covid virus last week. Using Mucinex.   Observations/Objective: Patient sounds cheerful and well on the phone. I do not appreciate any SOB. Speech and thought processing are grossly intact. Patient reported vitals:  Assessment and Plan: Sinusitis, treat with a Zpack.  Alysia Penna, MD   Follow Up Instructions:     [phone] 5-10 [phone] 11-20 [phone] 21-30 I did not refer this patient for an OV in the next 24 hours for this/these issue(s).  I discussed the assessment and treatment plan with the patient. The patient was provided an opportunity to ask questions and all were answered. The patient agreed with the plan and demonstrated an understanding of the instructions.   The patient was advised to call back or seek an in-person evaluation if the symptoms worsen or if the condition fails to improve as anticipated.  I provided 13 minutes of non-face-to-face time during this encounter.   Alysia Penna, MD     Review of Systems     Objective:   Physical Exam        Assessment & Plan:

## 2020-05-23 ENCOUNTER — Ambulatory Visit: Payer: 59

## 2020-05-28 ENCOUNTER — Other Ambulatory Visit: Payer: Self-pay | Admitting: Family Medicine

## 2020-05-30 ENCOUNTER — Encounter: Payer: 59 | Admitting: Internal Medicine

## 2020-06-10 ENCOUNTER — Other Ambulatory Visit: Payer: Self-pay | Admitting: Endocrinology

## 2020-06-11 ENCOUNTER — Other Ambulatory Visit: Payer: Self-pay | Admitting: Internal Medicine

## 2020-06-17 ENCOUNTER — Other Ambulatory Visit: Payer: Self-pay

## 2020-06-24 ENCOUNTER — Other Ambulatory Visit: Payer: 59

## 2020-06-24 DIAGNOSIS — Z20822 Contact with and (suspected) exposure to covid-19: Secondary | ICD-10-CM

## 2020-06-26 LAB — SARS-COV-2, NAA 2 DAY TAT

## 2020-06-26 LAB — NOVEL CORONAVIRUS, NAA: SARS-CoV-2, NAA: DETECTED — AB

## 2020-06-27 ENCOUNTER — Other Ambulatory Visit: Payer: Self-pay | Admitting: Endocrinology

## 2020-06-27 DIAGNOSIS — U071 COVID-19: Secondary | ICD-10-CM

## 2020-06-27 HISTORY — DX: COVID-19: U07.1

## 2020-07-02 ENCOUNTER — Ambulatory Visit: Payer: 59

## 2020-07-03 ENCOUNTER — Other Ambulatory Visit: Payer: 59

## 2020-07-03 ENCOUNTER — Other Ambulatory Visit: Payer: Self-pay

## 2020-07-04 ENCOUNTER — Other Ambulatory Visit: Payer: Self-pay

## 2020-07-04 ENCOUNTER — Ambulatory Visit (AMBULATORY_SURGERY_CENTER): Payer: 59

## 2020-07-04 VITALS — Ht 66.0 in | Wt 128.0 lb

## 2020-07-04 DIAGNOSIS — Z860100 Personal history of colon polyps, unspecified: Secondary | ICD-10-CM

## 2020-07-04 DIAGNOSIS — Z800 Family history of malignant neoplasm of digestive organs: Secondary | ICD-10-CM

## 2020-07-04 NOTE — Progress Notes (Signed)
Pre visit appt completed via phone; patient verified name, DOB, and address;  No egg or soy allergy known to patient  No issues with past sedation with any surgeries or procedures No intubation problems in the past  No FH of Malignant Hyperthermia No diet pills per patient No home 02 use per patient  No blood thinners per patient  Pt denies issues with constipation  No A fib or A flutter  EMMI video via [place] 19 guidelines implemented in [place] today with Pt and RN  Pt is fully vaccinated  for Covid  Coupon given to pt in [place] today , Code to Pharmacy  Due to the COVID-19 pandemic we are asking patients to follow certain guidelines.  Pt aware of COVID protocols and [facility] guidelines

## 2020-07-07 ENCOUNTER — Ambulatory Visit: Payer: 59 | Admitting: Endocrinology

## 2020-07-10 ENCOUNTER — Other Ambulatory Visit: Payer: Self-pay | Admitting: Endocrinology

## 2020-07-14 ENCOUNTER — Other Ambulatory Visit: Payer: Self-pay

## 2020-07-14 ENCOUNTER — Other Ambulatory Visit: Payer: Self-pay | Admitting: Internal Medicine

## 2020-07-14 ENCOUNTER — Other Ambulatory Visit (INDEPENDENT_AMBULATORY_CARE_PROVIDER_SITE_OTHER): Payer: 59

## 2020-07-14 DIAGNOSIS — E1165 Type 2 diabetes mellitus with hyperglycemia: Secondary | ICD-10-CM

## 2020-07-14 DIAGNOSIS — E7800 Pure hypercholesterolemia, unspecified: Secondary | ICD-10-CM

## 2020-07-14 LAB — COMPREHENSIVE METABOLIC PANEL
ALT: 58 U/L — ABNORMAL HIGH (ref 0–35)
AST: 76 U/L — ABNORMAL HIGH (ref 0–37)
Albumin: 3.7 g/dL (ref 3.5–5.2)
Alkaline Phosphatase: 136 U/L — ABNORMAL HIGH (ref 39–117)
BUN: 10 mg/dL (ref 6–23)
CO2: 33 mEq/L — ABNORMAL HIGH (ref 19–32)
Calcium: 9.3 mg/dL (ref 8.4–10.5)
Chloride: 99 mEq/L (ref 96–112)
Creatinine, Ser: 0.44 mg/dL (ref 0.40–1.20)
GFR: 99.53 mL/min (ref >60.00)
Glucose, Bld: 132 mg/dL — ABNORMAL HIGH (ref 70–99)
Potassium: 3.9 mEq/L (ref 3.5–5.1)
Sodium: 136 mEq/L (ref 135–145)
Total Bilirubin: 1.4 mg/dL — ABNORMAL HIGH (ref 0.2–1.2)
Total Protein: 8.1 g/dL (ref 6.0–8.3)

## 2020-07-14 LAB — LIPID PANEL
Cholesterol: 163 mg/dL (ref 0–200)
HDL: 41.5 mg/dL (ref >39.00)
LDL Cholesterol: 97 mg/dL (ref 0–99)
NonHDL: 121.3
Total CHOL/HDL Ratio: 4
Triglycerides: 120 mg/dL (ref 0.0–149.0)
VLDL: 24 mg/dL (ref 0.0–40.0)

## 2020-07-14 LAB — HEMOGLOBIN A1C: Hgb A1c MFr Bld: 5.5 % (ref 4.6–6.5)

## 2020-07-14 LAB — MICROALBUMIN / CREATININE URINE RATIO
Creatinine,U: 22 mg/dL
Microalb Creat Ratio: 10.2 mg/g (ref 0.0–30.0)
Microalb, Ur: 2.2 mg/dL — ABNORMAL HIGH (ref 0.0–1.9)

## 2020-07-15 ENCOUNTER — Other Ambulatory Visit: Payer: 59

## 2020-07-18 ENCOUNTER — Encounter: Payer: Self-pay | Admitting: Internal Medicine

## 2020-07-18 ENCOUNTER — Other Ambulatory Visit (INDEPENDENT_AMBULATORY_CARE_PROVIDER_SITE_OTHER): Payer: 59

## 2020-07-18 ENCOUNTER — Other Ambulatory Visit: Payer: Self-pay

## 2020-07-18 ENCOUNTER — Ambulatory Visit (AMBULATORY_SURGERY_CENTER): Payer: 59 | Admitting: Internal Medicine

## 2020-07-18 VITALS — BP 169/78 | HR 83 | Temp 97.2°F | Resp 26 | Ht 66.0 in | Wt 128.0 lb

## 2020-07-18 DIAGNOSIS — D128 Benign neoplasm of rectum: Secondary | ICD-10-CM

## 2020-07-18 DIAGNOSIS — Z800 Family history of malignant neoplasm of digestive organs: Secondary | ICD-10-CM

## 2020-07-18 DIAGNOSIS — D500 Iron deficiency anemia secondary to blood loss (chronic): Secondary | ICD-10-CM | POA: Diagnosis not present

## 2020-07-18 DIAGNOSIS — K754 Autoimmune hepatitis: Secondary | ICD-10-CM

## 2020-07-18 DIAGNOSIS — K621 Rectal polyp: Secondary | ICD-10-CM

## 2020-07-18 DIAGNOSIS — Z86010 Personal history of colonic polyps: Secondary | ICD-10-CM

## 2020-07-18 DIAGNOSIS — D122 Benign neoplasm of ascending colon: Secondary | ICD-10-CM

## 2020-07-18 HISTORY — PX: COLONOSCOPY: SHX174

## 2020-07-18 LAB — CBC WITH DIFFERENTIAL/PLATELET
Basophils Absolute: 0 10*3/uL (ref 0.0–0.1)
Basophils Relative: 0.3 % (ref 0.0–3.0)
Eosinophils Absolute: 0.1 10*3/uL (ref 0.0–0.7)
Eosinophils Relative: 3.1 % (ref 0.0–5.0)
HCT: 38.7 % (ref 36.0–46.0)
Hemoglobin: 13.5 g/dL (ref 12.0–15.0)
Lymphocytes Relative: 33.1 % (ref 12.0–46.0)
Lymphs Abs: 1.6 10*3/uL (ref 0.7–4.0)
MCHC: 34.9 g/dL (ref 30.0–36.0)
MCV: 103 fl — ABNORMAL HIGH (ref 78.0–100.0)
Monocytes Absolute: 0.4 10*3/uL (ref 0.1–1.0)
Monocytes Relative: 9.3 % (ref 3.0–12.0)
Neutro Abs: 2.6 10*3/uL (ref 1.4–7.7)
Neutrophils Relative %: 54.2 % (ref 43.0–77.0)
Platelets: 127 10*3/uL — ABNORMAL LOW (ref 150.0–400.0)
RBC: 3.76 Mil/uL — ABNORMAL LOW (ref 3.87–5.11)
RDW: 13.3 % (ref 11.5–15.5)
WBC: 4.8 10*3/uL (ref 4.0–10.5)

## 2020-07-18 LAB — FERRITIN: Ferritin: 101.3 ng/mL (ref 10.0–291.0)

## 2020-07-18 MED ORDER — SODIUM CHLORIDE 0.9 % IV SOLN: 500.0000 mL | Freq: Once | INTRAVENOUS | Status: DC

## 2020-07-18 NOTE — Progress Notes (Signed)
Vomiting during procedure, airway cleared with suction. Sats remained 98-99%. Zofran given.tb

## 2020-07-18 NOTE — Op Note (Addendum)
[facility] Patient Name: Miranda Cochran Procedure Date: 07/18/2020 11:06 AM MRN: [id number] Endoscopist: Gatha Mayer , MD Age: 70 Referring MD:  Date of Birth: 1951/05/23 Gender: Female Account #: [id number] Procedure:                Colonoscopy Indications:              Surveillance: Personal history of adenomatous                            polyps on last colonoscopy > 5 years ago Medicines:                Propofol per Anesthesia, Monitored Anesthesia Care Procedure:                Pre-Anesthesia Assessment:                           - Prior to the procedure, a History and Physical                            was performed, and patient medications and                            allergies were reviewed. The patient's tolerance of                            previous anesthesia was also reviewed. The risks                            and benefits of the procedure and the sedation                            options and risks were discussed with the patient.                            All questions were answered, and informed consent                            was obtained. Prior Anticoagulants: The patient has                            taken no previous anticoagulant or antiplatelet                            agents. ASA Grade Assessment: II - A patient with                            mild systemic disease. After reviewing the risks                            and benefits, the patient was deemed in                            satisfactory condition to undergo the procedure.  After obtaining informed consent, the colonoscope                            was passed under direct vision. Throughout the                            procedure, the patient's blood pressure, pulse, and                            oxygen saturations were monitored continuously. The                            Olympus PFC-[id number] (#[id number]) Colonoscope was                             introduced through the anus and advanced to the the                            cecum, identified by appendiceal orifice and                            ileocecal valve. The colonoscopy was performed with                            moderate difficulty due to restricted mobility of                            the colon and significant looping. Successful                            completion of the procedure was aided by using                            manual pressure and straightening and shortening                            the scope to obtain bowel loop reduction. The                            patient tolerated the procedure fairly well. The                            bowel preparation used was Miralax via split dose                            instruction. The quality of the bowel preparation                            was good. The ileocecal valve, appendiceal orifice,                            and rectum were photographed. Scope In: 11:28:34 AM Scope Out: 11:58:58 AM Scope Withdrawal Time: 0 hours 16 minutes 2 seconds  Total  Procedure Duration: 0 hours 30 minutes 24 seconds  Findings:                 The perianal and digital rectal examinations were                            normal.                           Three sessile polyps were found in the rectum and                            ascending colon. The polyps were 3 to 7 mm in size.                           Scattered diverticula were found in the entire                            colon.                           The exam was otherwise without abnormality on                            direct and retroflexion views. Complications:            No immediate complications. Estimated Blood Loss:     Estimated blood loss was minimal. Impression:               - Three 3 to 7 mm polyps in the rectum and in the                            ascending colon.                           - Diverticulosis in the entire examined colon.                            - The examination was otherwise normal on direct                            and retroflexion views.                           - No specimens collected.                           - Personal history of colonic polyp 6-7 mm adenoma                            2015 and also FHx CRCA. Recommendation:           - Patient has a contact number available for                            emergencies. The signs and symptoms of potential  delayed complications were discussed with the                            patient. Return to normal activities tomorrow.                            Written discharge instructions were provided to the                            patient.                           - Resume previous diet.                           - Continue present medications.                           - Await pathology results.                           - Repeat colonoscopy is recommended. The                            colonoscopy date will be determined after pathology                            results from today's exam become available for                            review.                           - To lab for CBC/ferritin today - on Azathiooprine                            or was and also hx anemia Gatha Mayer, MD 07/18/2020 12:10:08 PM This report has been signed electronically. Addendum Number: 1   Addendum Date: 07/22/2020 4:04:28 PM      Polyps all removed by cold snare technique. Complete removal. Gatha Mayer, MD 07/22/2020 4:04:48 PM This report has been signed electronically.

## 2020-07-18 NOTE — Progress Notes (Signed)
Called to room to assist during endoscopic procedure.  Patient ID and intended procedure confirmed with present staff. Received instructions for my participation in the procedure from the performing physician.  

## 2020-07-18 NOTE — Progress Notes (Signed)
VS by CW.  previsit by BG.  No changes to health hx since previsit

## 2020-07-18 NOTE — Patient Instructions (Addendum)
I removed 3 small polyps today.  I will let you know results and plans soon.  You need a blood count today - have to check that while on azathioprine. We will take you to the lab.  YOU HAD AN ENDOSCOPIC PROCEDURE TODAY AT [facility]:   Refer to the procedure report that was given to you for any specific questions about what was found during the examination.  If the procedure report does not answer your questions, please call your gastroenterologist to clarify.  If you requested that your care partner not be given the details of your procedure findings, then the procedure report has been included in a sealed envelope for you to review at your convenience later.  YOU SHOULD EXPECT: Some feelings of bloating in the abdomen. Passage of more gas than usual.  Walking can help get rid of the air that was put into your GI tract during the procedure and reduce the bloating. If you had a lower endoscopy (such as a colonoscopy or flexible sigmoidoscopy) you may notice spotting of blood in your stool or on the toilet paper. If you underwent a bowel prep for your procedure, you may not have a normal bowel movement for a few days.  Please Note:  You might notice some irritation and congestion in your nose or some drainage.  This is from the oxygen used during your procedure.  There is no need for concern and it should clear up in a day or so.  SYMPTOMS TO REPORT IMMEDIATELY:   Following lower endoscopy (colonoscopy or flexible sigmoidoscopy):  Excessive amounts of blood in the stool  Significant tenderness or worsening of abdominal pains  Swelling of the abdomen that is new, acute  Fever of 100F or higher   For urgent or emergent issues, a gastroenterologist can be reached at any hour by calling [phone]. Do not use MyChart messaging for urgent concerns.    DIET:  We do recommend a small meal at first, but then you may proceed to your regular diet.  Drink plenty of fluids but you  should avoid alcoholic beverages for 24 hours.  MEDICATIONS: Continue present medications.  Please see handouts given to you by your recovery nurse.  We will transport you to the lab prior to discharge today to have blood drawn for CBC and Ferritin (Iron).  ACTIVITY:  You should plan to take it easy for the rest of today and you should NOT DRIVE or use heavy machinery until tomorrow (because of the sedation medicines used during the test).    FOLLOW UP: Our staff will call the number listed on your records 48-72 hours following your procedure to check on you and address any questions or concerns that you may have regarding the information given to you following your procedure. If we do not reach you, we will leave a message.  We will attempt to reach you two times.  During this call, we will ask if you have developed any symptoms of COVID 19. If you develop any symptoms (ie: fever, flu-like symptoms, shortness of breath, cough etc.) before then, please call [phone].  If you test positive for Covid 19 in the 2 weeks post procedure, please call and report this information to [place].    If any biopsies were taken you will be contacted by phone or by letter within the next 1-3 weeks.  Please call us at [phone] if you have not heard about the biopsies in 3 weeks.  Thank you for allowing [place] to provide for your healthcare needs today.   SIGNATURES/CONFIDENTIALITY: You and/or your care partner have signed paperwork which will be entered into your electronic medical record.  These signatures attest to the fact that that the information above on your After Visit Summary has been reviewed and is understood.  Full responsibility of the confidentiality of this discharge information lies with you and/or your care-partner.

## 2020-07-18 NOTE — Progress Notes (Signed)
To PACU, VSS. Report to [email or web address] 

## 2020-07-22 ENCOUNTER — Other Ambulatory Visit: Payer: Self-pay

## 2020-07-22 ENCOUNTER — Telehealth: Payer: Self-pay | Admitting: *Deleted

## 2020-07-22 ENCOUNTER — Ambulatory Visit: Payer: 59 | Admitting: Endocrinology

## 2020-07-22 ENCOUNTER — Encounter: Payer: Self-pay | Admitting: Endocrinology

## 2020-07-22 VITALS — BP 138/86 | HR 78 | Ht 66.0 in | Wt 131.6 lb

## 2020-07-22 DIAGNOSIS — E7800 Pure hypercholesterolemia, unspecified: Secondary | ICD-10-CM | POA: Diagnosis not present

## 2020-07-22 DIAGNOSIS — E119 Type 2 diabetes mellitus without complications: Secondary | ICD-10-CM | POA: Diagnosis not present

## 2020-07-22 DIAGNOSIS — E041 Nontoxic single thyroid nodule: Secondary | ICD-10-CM

## 2020-07-22 NOTE — Progress Notes (Signed)
[name], female   DOB: [birth year], 70 y.o.   MRN: [id number]          Reason for Appointment: Follow-up for Type 2 Diabetes    History of Present Illness:          Date of diagnosis of type 2 diabetes mellitus: ?  2013        Background history:   She had a blood sugar over 200 in 2013 but was not told to have diabetes until 04/2015 A1c was 7.3 in 3/16 In 04/2015 blood sugar was 478 says her blood sugar was 600 when she was diagnosed and was not having any significant symptoms Initially treated with Metaglip but this apparently caused abdominal discomfort and she was switched to metformin In 2017 she had been tried on Bydureon for better control but she stopped this after a few weeks because of large persisted nodules in her skin She thinks her blood sugars were better with this and A1c was down to 6.8  Recent history:   Non-insulin hypoglycemic drugs the patient is taking are: Trulicity 3 mg weekly, Prandin 0.5 mg twice daily  Her A1c is usually falsely low and is now 5.5 compared to 6  Fructosamine last 302 compared to 284 previously  Current management, blood sugar patterns and problems identified:    She has had good compliance with her Prandin before breakfast and dinnertime and Trulicity once a week  No side effects with these  Unable to assess her blood sugars accurately since she checking blood sugars very rarely and only 5 times in the last month  As before she will sometimes have sweets or regular drinks and blood sugar may be over 200 randomly  Her weight is about the same  Currently not doing much exercise with various intercurrent problems  She does not see the dietitian because of cost        Side effects from medications have been: Metaglip caused abdominal pain, Tradjenta caused swelling of the legs, Bydureon caused skin nodules, Farxiga caused diarrhea, glipizide causes dizziness and weakness, Metformin caused GI side effects,  Invokana causes yeast infections  Compliance with the medical regimen: Inconsistent  Glucose monitoring:  done less than 1 times a day         Glucometer: One Verio     Blood Glucose readings by time of day by monitor download:  Recent blood sugar range 111-1 246 with only 1 high reading AVERAGE 154 with most readings between 10 AM-2 PM  Previous range 113-187, AVERAGE 136   Self-care:  Typical meal intake: Breakfast is Oatmeal, usually eating low-fat dinner and her snacks will be fruit, peanut butter crackers                Dietician visit, most recent: None               Weight history:  Wt Readings from Last 3 Encounters:  07/22/20 131 lb 9.6 oz (59.7 kg)  07/18/20 128 lb (58.1 kg)  07/04/20 128 lb (58.1 kg)    Glycemic control:   Lab Results  Component Value Date   HGBA1C 5.5 07/14/2020   HGBA1C 6.0 03/04/2020   HGBA1C 6.4 11/30/2019   Lab Results  Component Value Date   MICROALBUR 2.2 (H) 07/14/2020   LDLCALC 97 07/14/2020   CREATININE 0.44 07/14/2020   Lab Results  Component Value Date   MICRALBCREAT 10.2 07/14/2020    Lab Results  Component Value Date  FRUCTOSAMINE 302 (H) 11/30/2019   FRUCTOSAMINE 284 06/04/2019   FRUCTOSAMINE 265 02/27/2019    Appointment on 07/18/2020  Component Date Value Ref Range Status   Ferritin 07/18/2020 101.3  10.0 - 291.0 ng/mL Final   WBC 07/18/2020 4.8  4.0 - 10.5 K/uL Final   RBC 07/18/2020 3.76* 3.87 - 5.11 Mil/uL Final   Hemoglobin 07/18/2020 13.5  12.0 - 15.0 g/dL Final   HCT 07/18/2020 38.7  36.0 - 46.0 % Final   MCV 07/18/2020 103.0* 78.0 - 100.0 fl Final   MCHC 07/18/2020 34.9  30.0 - 36.0 g/dL Final   RDW 07/18/2020 13.3  11.5 - 15.5 % Final   Platelets 07/18/2020 127.0* 150.0 - 400.0 K/uL Final   Neutrophils Relative % 07/18/2020 54.2  43.0 - 77.0 % Final   Lymphocytes Relative 07/18/2020 33.1  12.0 - 46.0 % Final   Monocytes Relative 07/18/2020 9.3  3.0 - 12.0 % Final   Eosinophils  Relative 07/18/2020 3.1  0.0 - 5.0 % Final   Basophils Relative 07/18/2020 0.3  0.0 - 3.0 % Final   Neutro Abs 07/18/2020 2.6  1.4 - 7.7 K/uL Final   Lymphs Abs 07/18/2020 1.6  0.7 - 4.0 K/uL Final   Monocytes Absolute 07/18/2020 0.4  0.1 - 1.0 K/uL Final   Eosinophils Absolute 07/18/2020 0.1  0.0 - 0.7 K/uL Final   Basophils Absolute 07/18/2020 0.0  0.0 - 0.1 K/uL Final     Allergies as of 07/22/2020      Reactions   Fentanyl Anxiety   Made her feel very anxious, requests not to get it.   Levofloxacin Anxiety   Weird dreams    Prednisone    Anxiety and paranoia   Amoxicillin Diarrhea   Has patient had a PCN reaction causing immediate rash, facial/tongue/throat swelling, SOB or lightheadedness with hypotension: No Has patient had a PCN reaction causing severe rash involving mucus membranes or skin necrosis: No Has patient had a PCN reaction that required hospitalization: No Has patient had a PCN reaction occurring within the last 10 years: Yes If all of the above answers are "NO", then may proceed with Cephalosporin use.   Augmentin [amoxicillin-pot Clavulanate] Other (See Comments)   diarrhea   Farxiga [dapagliflozin] Diarrhea   Gi pain   Statins    Myalgia       Medication List       Accurate as of July 22, 2020  1:34 PM. If you have any questions, ask your nurse or doctor.        ALPRAZolam 0.5 MG tablet Commonly known as: XANAX TAKE 1 TABLET(0.5 MG) BY MOUTH AT BEDTIME AS NEEDED FOR SLEEP   aspirin 325 MG tablet Take 650-975 mg by mouth daily as needed for moderate pain or headache.   cyclobenzaprine 10 MG tablet Commonly known as: FLEXERIL TAKE 1 TABLET BY MOUTH EVERY 8 HOURS AS NEEDED FOR MUSCLE SPASMS WITH ONE BEING AT BEDTIME IF NEEDED   diphenhydrAMINE 25 mg capsule Commonly known as: BENADRYL Take 25-50 mg by mouth daily as needed for allergies.   diphenhydrAMINE-zinc acetate cream Commonly known as: BENADRYL Apply 1 application topically daily  as needed for itching.   Effexor XR 75 MG 24 hr capsule Generic drug: venlafaxine XR TAKE 1 CAPSULE BY MOUTH TWICE DAILY WITH A MEAL   ezetimibe 10 MG tablet Commonly known as: ZETIA TAKE 1 TABLET(10 MG) BY MOUTH DAILY   ferrous gluconate 324 MG tablet Commonly known as: FERGON Take 324 mg by mouth  daily.   Fluticasone-Salmeterol 100-50 MCG/DOSE Aepb Commonly known as: Advair Diskus Inhale 1 puff into the lungs 2 (two) times daily. What changed:   when to take this  reasons to take this   furosemide 20 MG tablet Commonly known as: LASIX TAKE 1 TABLET(20 MG) BY MOUTH DAILY AS NEEDED   Hair Skin and Nails Formula Tabs Take 1 tablet by mouth daily.   lansoprazole 30 MG capsule Commonly known as: PREVACID TAKE 1 CAPSULE(30 MG) BY MOUTH DAILY   montelukast 10 MG tablet Commonly known as: SINGULAIR TAKE 1 TABLET BY MOUTH AT BEDTIME   OneTouch Delica Plus [id number] Misc USE AS DIRECTED UP TO 4 TIMES DAILY AS DIRECTED   OneTouch Verio Flex System w/Device Kit Use Onetouch Verio Flex to check blood sugar once daily.   OneTouch Verio test strip Generic drug: glucose blood USE TO CHECK BLOOD SUGAR ONCE DAILY   phenylephrine 10 MG Tabs tablet Commonly known as: SUDAFED PE Take 10 mg by mouth every 4 (four) hours as needed.   promethazine 25 MG tablet Commonly known as: PHENERGAN Take 1 tablet (25 mg total) by mouth every 8 (eight) hours as needed for nausea or vomiting.   repaglinide 0.5 MG tablet Commonly known as: PRANDIN TAKE 1 TABLET(0.5 MG) BY MOUTH TWICE DAILY BEFORE A MEAL   sodium chloride inhaler solution Commonly known as: BRONCHO SALINE Take 1 spray by nebulization as needed.   Trulicity 3 VV/6.1YW Sopn Generic drug: Dulaglutide INJECT CONTENTS OF ONE SYRINGE($RemoveBefore'3MG'[id number]$ ) UNDER THE SKIN ONCE WEEKLY       Allergies:  Allergies  Allergen Reactions   Fentanyl Anxiety    Made her feel very anxious, requests not to get it.   Levofloxacin Anxiety     Weird dreams    Prednisone     Anxiety and paranoia   Amoxicillin Diarrhea    Has patient had a PCN reaction causing immediate rash, facial/tongue/throat swelling, SOB or lightheadedness with hypotension: No Has patient had a PCN reaction causing severe rash involving mucus membranes or skin necrosis: No Has patient had a PCN reaction that required hospitalization: No Has patient had a PCN reaction occurring within the last 10 years: Yes If all of the above answers are "NO", then may proceed with Cephalosporin use.    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   GERD (gastroesophageal reflux disease)    on meds   Hiatal hernia    Hx of adenomatous polyp of colon 07/07/2009   Hyperlipidemia    diet controlled   Hypertension    on meds - LASIX   IBS (irritable bowel syndrome)    Iron deficiency anemia    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 Carlean Purl, adenimatous polyps, repeat in 7 yr   ESOPHAGOGASTRODUODENOSCOPY  02/2002   ESOPHAGOGASTRODUODENOSCOPY (EGD) WITH PROPOFOL N/A 06/20/2017   Procedure: ESOPHAGOGASTRODUODENOSCOPY (EGD) WITH PROPOFOL;  Surgeon: Gatha Mayer, MD;  Location: [facility];  Service:  Endoscopy;  Laterality: N/A;  with Barrx   EYE SURGERY     NASAL SEPTOPLASTY W/ TURBINOPLASTY     POLYPECTOMY  2015   CG-TA   TONSILLECTOMY      Family History  Problem Relation Age of Onset   Stroke Mother    Hypertension Mother    Colon cancer Father 47   Liver cancer Father 35   Colon polyps Father 64   Heart disease Sister        hole in heart   Stroke Maternal Grandmother    Hypertension Maternal Grandmother    Esophageal cancer Maternal Grandfather    Throat cancer Maternal Grandfather    Breast cancer Paternal Grandmother    Brain cancer Paternal Grandmother    Lung cancer Paternal Grandfather    Pancreatic cancer Maternal Aunt    Brain cancer Maternal Uncle    Diabetes Neg Hx    Rectal cancer Neg Hx    Stomach cancer Neg Hx     Social History:  reports that she has never smoked. She has never used smokeless tobacco. She reports current alcohol use. She reports that she does not use drugs.   Review of Systems   Lipid history: She has persistent hypercholesterolemia Highest baseline LDL about 170  Previously had tried Zocor which apparently caused muscle aches, she took Crestor and she thinks generic causes muscle cramps She took Livalo for a couple of days but she thinks she didn't feel well with this and had some palpitations reportedly  She has been prescribed Zetia which she is now taking regularly LDL is much better at 97 compared to 156   Lab Results  Component Value Date   CHOL 163 07/14/2020   CHOL 236 (H) 08/01/2019   CHOL 219 (H) 12/20/2018   Lab Results  Component Value Date   HDL 41.50 07/14/2020   HDL 59.40 08/01/2019   HDL 56.30 12/20/2018   Lab Results  Component Value Date   LDLCALC 97 07/14/2020   LDLCALC 156 (H) 08/01/2019   LDLCALC 143 (H) 12/20/2018   Lab Results  Component Value Date   TRIG 120.0 07/14/2020   TRIG 104.0 08/01/2019   TRIG 101.0 12/20/2018   Lab Results  Component Value Date    CHOLHDL 4 07/14/2020   CHOLHDL 4 08/01/2019   CHOLHDL 4 12/20/2018   Lab Results  Component Value Date   LDLDIRECT 153.0 02/27/2019           Lab Results  Component Value Date   ALT 58 (H) 07/14/2020    Hypertension: She states she has whitecoat syndrome and her blood pressure readings are generally normal at home Has not compared home monitor to the office reading   BP Readings from Last 3 Encounters:  07/22/20 138/86  07/18/20 (!) 169/78  05/06/20 (!) 142/92    She takes Lasix  for complaints of having swelling in her abdomen Potassium has improved  Lab Results  Component Value Date   K 3.9 07/14/2020     Most recent eye exam was 10/20  Most recent foot exam: 2/22  Has had normal thyroid levels  previously, benign thyroid nodule in 2012 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  Lab Results  Component Value Date   TSH 0.91 06/04/2019     LABS:  Appointment on 07/18/2020  Component Date Value Ref Range Status   Ferritin 07/18/2020 101.3  10.0 - 291.0 ng/mL Final   WBC 07/18/2020 4.8  4.0 - 10.5 K/uL Final   RBC 07/18/2020 3.76* 3.87 - 5.11 Mil/uL Final   Hemoglobin 07/18/2020 13.5  12.0 - 15.0 g/dL Final   HCT 07/18/2020 38.7  36.0 - 46.0 % Final   MCV 07/18/2020 103.0* 78.0 - 100.0 fl Final   MCHC 07/18/2020 34.9  30.0 - 36.0 g/dL Final   RDW 07/18/2020 13.3  11.5 - 15.5 % Final   Platelets 07/18/2020 127.0* 150.0 - 400.0 K/uL Final   Neutrophils Relative % 07/18/2020 54.2  43.0 - 77.0 % Final   Lymphocytes Relative 07/18/2020 33.1  12.0 - 46.0 % Final   Monocytes Relative 07/18/2020 9.3  3.0 - 12.0 % Final   Eosinophils Relative 07/18/2020 3.1  0.0 - 5.0 % Final   Basophils Relative 07/18/2020 0.3  0.0 - 3.0 % Final   Neutro Abs 07/18/2020 2.6  1.4 - 7.7 K/uL Final   Lymphs Abs 07/18/2020 1.6  0.7 - 4.0 K/uL Final   Monocytes Absolute 07/18/2020 0.4  0.1 - 1.0 K/uL Final   Eosinophils Absolute 07/18/2020 0.1   0.0 - 0.7 K/uL Final   Basophils Absolute 07/18/2020 0.0  0.0 - 0.1 K/uL Final    Physical Examination:  BP 138/86    Pulse 78    Ht $R'5\' 6"'CU$  (1.676 m)    Wt 131 lb 9.6 oz (59.7 kg)    SpO2 99%    BMI 21.24 kg/m   Diabetic Foot Exam - Simple   Simple Foot Form Diabetic Foot exam was performed with the following findings: Yes   Visual Inspection No deformities, no ulcerations, no other skin breakdown bilaterally: Yes See comments: Yes Sensation Testing Intact to touch and monofilament testing bilaterally: Yes Pulse Check Posterior Tibialis and Dorsalis pulse intact bilaterally: Yes Comments Skin is appearing cyanotic distally with some coolness but blanches on pressure     No pedal edema    ASSESSMENT:  Diabetes type 2, nonobese  See history of present illness for detailed discussion of current diabetes management, blood sugar patterns and problems identified  A1c has been usually lower than expected, now 5.5 Previously fructosamine has been upper normal or slightly high  She has only 5 readings in the last month at home Since her A1c is lower than before she likely has better control especially with continued higher dose of Trulicity now  Has only 1 high reading at home and she will still occasionally have sweets or sweet drinks Recently not exercising  Weight has leveled off  Lipids: Better controlled and she can continue Verio  She has whitecoat syndrome No microalbuminuria  PLAN:     No change in her Prandin and Trulicity doses Encouraged to check more readings after meals especially in the evening Start regular walking when able to To discuss abnormal liver function with her gastroenterologist Also check fructosamine on the next visit  Discussed LDL targets and encouraged her to stay on Zetia regularly  There are no Patient Instructions on file for this visit.       Elayne Snare 07/22/2020, 1:34 PM   Note: This office note was prepared with Dragon  voice recognition system technology.  Any transcriptional errors that result from this process are unintentional.

## 2020-07-22 NOTE — Telephone Encounter (Signed)
  Follow up Call-  Call back number 07/18/2020  Post procedure Call Back phone  # [phone]  Permission to leave phone message Yes  Some recent data might be hidden     Patient questions:  Do you have a fever, pain , or abdominal swelling? No. Pain Score  0 *  Have you tolerated food without any problems? Yes.    Have you been able to return to your normal activities? Yes.    Do you have any questions about your discharge instructions: Diet   No. Medications  No. Follow up visit  No.  Do you have questions or concerns about your Care? No.  Actions: * If pain score is 4 or above: 1. No action needed, pain <4.Have you developed a fever since your procedure? no  2.   Have you had an respiratory symptoms (SOB or cough) since your procedure? no  3.   Have you tested positive for COVID 19 since your procedure no  4.   Have you had any family members/close contacts diagnosed with the COVID 19 since your procedure?  no   If yes to any of these questions please route to Joylene John, RN and Joella Prince, RN

## 2020-07-23 ENCOUNTER — Other Ambulatory Visit: Payer: Self-pay

## 2020-07-23 ENCOUNTER — Telehealth: Payer: Self-pay | Admitting: Internal Medicine

## 2020-07-23 DIAGNOSIS — K754 Autoimmune hepatitis: Secondary | ICD-10-CM

## 2020-07-23 DIAGNOSIS — D7589 Other specified diseases of blood and blood-forming organs: Secondary | ICD-10-CM

## 2020-07-23 DIAGNOSIS — D696 Thrombocytopenia, unspecified: Secondary | ICD-10-CM

## 2020-07-23 NOTE — Telephone Encounter (Signed)
Inbound call from patient returning your call. 

## 2020-07-23 NOTE — Telephone Encounter (Signed)
See results notes for details.  

## 2020-07-25 ENCOUNTER — Inpatient Hospital Stay: Admission: RE | Admit: 2020-07-25 | Payer: 59 | Source: Ambulatory Visit

## 2020-07-28 ENCOUNTER — Other Ambulatory Visit (INDEPENDENT_AMBULATORY_CARE_PROVIDER_SITE_OTHER): Payer: 59

## 2020-07-28 ENCOUNTER — Other Ambulatory Visit: Payer: Self-pay

## 2020-07-28 ENCOUNTER — Ambulatory Visit (HOSPITAL_COMMUNITY)
Admission: RE | Admit: 2020-07-28 | Discharge: 2020-07-28 | Disposition: A | Discharge status: Home / Self Care | Payer: 59 | Source: Ambulatory Visit | Attending: Internal Medicine | Admitting: Internal Medicine

## 2020-07-28 DIAGNOSIS — D696 Thrombocytopenia, unspecified: Secondary | ICD-10-CM

## 2020-07-28 DIAGNOSIS — K754 Autoimmune hepatitis: Secondary | ICD-10-CM | POA: Insufficient documentation

## 2020-07-28 DIAGNOSIS — D7589 Other specified diseases of blood and blood-forming organs: Secondary | ICD-10-CM | POA: Diagnosis not present

## 2020-07-28 LAB — PROTIME-INR
INR: 1.3 ratio — ABNORMAL HIGH (ref 0.8–1.0)
Prothrombin Time: 14 s — ABNORMAL HIGH (ref 9.6–13.1)

## 2020-07-28 LAB — VITAMIN B12: Vitamin B-12: 277 pg/mL (ref 211–911)

## 2020-07-30 ENCOUNTER — Telehealth: Payer: Self-pay | Admitting: Internal Medicine

## 2020-07-30 ENCOUNTER — Encounter: Payer: Self-pay | Admitting: Internal Medicine

## 2020-07-30 DIAGNOSIS — D696 Thrombocytopenia, unspecified: Secondary | ICD-10-CM

## 2020-07-30 DIAGNOSIS — K754 Autoimmune hepatitis: Secondary | ICD-10-CM

## 2020-07-30 NOTE — Telephone Encounter (Signed)
Patient is willing to proceed with liver bx./  Orders placed.  She is aware she will be contacted by the [facility] central scheduling in the next week .

## 2020-07-30 NOTE — Telephone Encounter (Signed)
Inbound call from patient requesting a call back from a nurse please to schedule a liver biopsy.

## 2020-07-31 ENCOUNTER — Telehealth (HOSPITAL_COMMUNITY): Payer: Self-pay

## 2020-08-03 ENCOUNTER — Other Ambulatory Visit: Payer: Self-pay | Admitting: Family Medicine

## 2020-08-03 DIAGNOSIS — Z760 Encounter for issue of repeat prescription: Secondary | ICD-10-CM

## 2020-08-04 NOTE — Telephone Encounter (Signed)
Last video visit- 05/19/2020 Last refill- 01/22/2020--30 tabs 5 refills  No future appointment scheduled

## 2020-08-20 ENCOUNTER — Other Ambulatory Visit: Payer: Self-pay | Admitting: Radiology

## 2020-08-21 ENCOUNTER — Ambulatory Visit (HOSPITAL_COMMUNITY)
Admission: RE | Admit: 2020-08-21 | Discharge: 2020-08-21 | Disposition: A | Discharge status: Home / Self Care | Payer: 59 | Source: Ambulatory Visit | Attending: Internal Medicine | Admitting: Internal Medicine

## 2020-08-21 ENCOUNTER — Other Ambulatory Visit: Payer: Self-pay

## 2020-08-21 ENCOUNTER — Encounter (HOSPITAL_COMMUNITY): Payer: Self-pay

## 2020-08-21 DIAGNOSIS — K739 Chronic hepatitis, unspecified: Secondary | ICD-10-CM | POA: Insufficient documentation

## 2020-08-21 DIAGNOSIS — K754 Autoimmune hepatitis: Secondary | ICD-10-CM | POA: Diagnosis not present

## 2020-08-21 DIAGNOSIS — D696 Thrombocytopenia, unspecified: Secondary | ICD-10-CM | POA: Diagnosis present

## 2020-08-21 DIAGNOSIS — Z8616 Personal history of COVID-19: Secondary | ICD-10-CM | POA: Diagnosis not present

## 2020-08-21 LAB — COMPREHENSIVE METABOLIC PANEL
ALT: 87 U/L — ABNORMAL HIGH (ref 0–44)
AST: 107 U/L — ABNORMAL HIGH (ref 15–41)
Albumin: 3.8 g/dL (ref 3.5–5.0)
Alkaline Phosphatase: 149 U/L — ABNORMAL HIGH (ref 38–126)
Anion gap: 12 (ref 5–15)
BUN: 11 mg/dL (ref 8–23)
CO2: 25 mmol/L (ref 22–32)
Calcium: 9.1 mg/dL (ref 8.9–10.3)
Chloride: 100 mmol/L (ref 98–111)
Creatinine, Ser: 0.41 mg/dL — ABNORMAL LOW (ref 0.44–1.00)
GFR, Estimated: >60 mL/min (ref >60)
Glucose, Bld: 141 mg/dL — ABNORMAL HIGH (ref 70–99)
Potassium: 3.9 mmol/L (ref 3.5–5.1)
Sodium: 137 mmol/L (ref 135–145)
Total Bilirubin: 1.6 mg/dL — ABNORMAL HIGH (ref 0.3–1.2)
Total Protein: 8.6 g/dL — ABNORMAL HIGH (ref 6.5–8.1)

## 2020-08-21 LAB — CBC WITH DIFFERENTIAL/PLATELET
Abs Immature Granulocytes: 0.01 10*3/uL (ref 0.00–0.07)
Basophils Absolute: 0 10*3/uL (ref 0.0–0.1)
Basophils Relative: 1 %
Eosinophils Absolute: 0.2 10*3/uL (ref 0.0–0.5)
Eosinophils Relative: 4 %
HCT: 43.5 % (ref 36.0–46.0)
Hemoglobin: 15.1 g/dL — ABNORMAL HIGH (ref 12.0–15.0)
Immature Granulocytes: 0 %
Lymphocytes Relative: 35 %
Lymphs Abs: 1.7 10*3/uL (ref 0.7–4.0)
MCH: 35.2 pg — ABNORMAL HIGH (ref 26.0–34.0)
MCHC: 34.7 g/dL (ref 30.0–36.0)
MCV: 101.4 fL — ABNORMAL HIGH (ref 80.0–100.0)
Monocytes Absolute: 0.4 10*3/uL (ref 0.1–1.0)
Monocytes Relative: 9 %
Neutro Abs: 2.6 10*3/uL (ref 1.7–7.7)
Neutrophils Relative %: 51 %
Platelets: 143 10*3/uL — ABNORMAL LOW (ref 150–400)
RBC: 4.29 MIL/uL (ref 3.87–5.11)
RDW: 12.8 % (ref 11.5–15.5)
WBC: 4.9 10*3/uL (ref 4.0–10.5)
nRBC: 0 % (ref 0.0–0.2)

## 2020-08-21 LAB — PROTIME-INR
INR: 1.2 (ref 0.8–1.2)
Prothrombin Time: 14.2 seconds (ref 11.4–15.2)

## 2020-08-21 MED ORDER — MIDAZOLAM HCL 2 MG/2ML IJ SOLN
INTRAMUSCULAR | Status: AC
  Filled 2020-08-21: qty 4

## 2020-08-21 MED ORDER — HYDROMORPHONE HCL 1 MG/ML IJ SOLN
INTRAMUSCULAR | Status: AC | PRN
  Administered 2020-08-21: 1 mg via INTRAVENOUS

## 2020-08-21 MED ORDER — GELATIN ABSORBABLE 12-7 MM EX MISC
CUTANEOUS | Status: AC
  Filled 2020-08-21: qty 1

## 2020-08-21 MED ORDER — LIDOCAINE HCL 1 % IJ SOLN
INTRAMUSCULAR | Status: AC
  Filled 2020-08-21: qty 20

## 2020-08-21 MED ORDER — HYDROMORPHONE HCL 2 MG/ML IJ SOLN
INTRAMUSCULAR | Status: AC
  Filled 2020-08-21: qty 1

## 2020-08-21 MED ORDER — LIDOCAINE HCL (PF) 1 % IJ SOLN
INTRAMUSCULAR | Status: AC | PRN
  Administered 2020-08-21: 5 mL

## 2020-08-21 MED ORDER — MIDAZOLAM HCL 2 MG/2ML IJ SOLN
INTRAMUSCULAR | Status: AC | PRN
  Administered 2020-08-21 (×2): 1 mg via INTRAVENOUS

## 2020-08-21 MED ORDER — DIPHENHYDRAMINE HCL 50 MG/ML IJ SOLN
INTRAMUSCULAR | Status: AC
  Filled 2020-08-21: qty 1

## 2020-08-21 MED ORDER — SODIUM CHLORIDE 0.9 % IV SOLN: INTRAVENOUS | Status: DC

## 2020-08-21 NOTE — Procedures (Signed)
Interventional Radiology Procedure Note  Procedure: US guided random liver biopsy  Complications: None  Estimated Blood Loss: None  Recommendations: - Bedrest x 2 hrs - DC home   Signed,  Saloma Cadena K. Inga Noller, MD    

## 2020-08-21 NOTE — Discharge Instructions (Signed)
Liver Biopsy, Care After These instructions give you information on caring for yourself after your procedure. Your doctor may also give you more specific instructions. Call your doctor if you have any problems or questions after your procedure. What can I expect after the procedure? After the procedure, it is common to have:  Pain and soreness where the biopsy was done.  Bruising around the area where the biopsy was done.  Sleepiness and be tired for a few days. Follow these instructions at home: Medicines  Take over-the-counter and prescription medicines only as told by your doctor.  If you were prescribed an antibiotic medicine, take it as told by your doctor. Do not stop taking the antibiotic even if you start to feel better.  Do not take medicines such as aspirin and ibuprofen. These medicines can thin your blood. Do not take these medicines unless your doctor tells you to take them.  If you are taking prescription pain medicine, take actions to prevent or treat constipation. Your doctor may recommend that you: ? Drink enough fluid to keep your pee (urine) clear or pale yellow. ? Take over-the-counter or prescription medicines. ? Eat foods that are high in fiber, such as fresh fruits and vegetables, whole grains, and beans. ? Limit foods that are high in fat and processed sugars, such as fried and sweet foods. Caring for your cut  Follow instructions from your doctor about how to take care of your cuts from surgery (incisions). Make sure you: ? Wash your hands with soap and water before you change your bandage (dressing). If you cannot use soap and water, use hand sanitizer. ? Change your bandage as told by your doctor. ? Leave stitches (sutures), skin glue, or skin tape (adhesive) strips in place. They may need to stay in place for 2 weeks or longer. If tape strips get loose and curl up, you may trim the loose edges. Do not remove tape strips completely unless your doctor says it is  okay.  Check your cuts every day for signs of infection. Check for: ? Redness, swelling, or more pain. ? Fluid or blood. ? Pus or a bad smell. ? Warmth.  Do not take baths, swim, or use a hot tub until your doctor says it is okay to do so. Activity  Rest at home for 1-2 days or as told by your doctor. ? Avoid sitting for a long time without moving. Get up to take short walks every 1-2 hours.  Return to your normal activities as told by your doctor. Ask what activities are safe for you.  Do not do these things in the first 24 hours: ? Drive. ? Use machinery. ? Take a bath or shower.  Do not lift more than 10 pounds (4.5 kg) or play contact sports for the first 2 weeks.   General instructions  Do not drink alcohol in the first week after the procedure.  Have someone stay with you for at least 24 hours after the procedure.  Get your test results. Ask your doctor or the department that is doing the test: ? When will my results be ready? ? How will I get my results? ? What are my treatment options? ? What other tests do I need? ? What are my next steps?  Keep all follow-up visits as told by your doctor. This is important.   Contact a doctor if:  A cut bleeds and leaves more than just a small spot of blood.  A cut is red,  puffs up (swells), or hurts more than before.  Fluid or something else comes from a cut.  A cut smells bad.  You have a fever or chills. Get help right away if:  You have swelling, bloating, or pain in your belly (abdomen).  You get dizzy or faint.  You have a rash.  You feel sick to your stomach (nauseous) or throw up (vomit).  You have trouble breathing, feel short of breath, or feel faint.  Your chest hurts.  You have problems talking or seeing.  You have trouble with your balance or moving your arms or legs. Summary  After the procedure, it is common to have pain, soreness, bruising, and tiredness.  Your doctor will tell you how to  take care of yourself at home. Change your bandage, take your medicines, and limit your activities as told by your doctor.  Call your doctor if you have symptoms of infection. Get help right away if your belly swells, your cut bleeds a lot, or you have trouble talking or breathing. This information is not intended to replace advice given to you by your health care provider. Make sure you discuss any questions you have with your health care provider.   . Moderate Conscious Sedation, Adult, Care After This sheet gives you information about how to care for yourself after your procedure. Your health care provider may also give you more specific instructions. If you have problems or questions, contact your health care provider. What can I expect after the procedure? After the procedure, it is common to have:  Sleepiness for several hours.  Impaired judgment for several hours.  Difficulty with balance.  Vomiting if you eat too soon. Follow these instructions at home: For the time period you were told by your health care provider:  Rest.  Do not participate in activities where you could fall or become injured.  Do not drive or use machinery.  Do not drink alcohol.  Do not take sleeping pills or medicines that cause drowsiness.  Do not make important decisions or sign legal documents.  Do not take care of children on your own.      Eating and drinking  Follow the diet recommended by your health care provider.  Drink enough fluid to keep your urine pale yellow.  If you vomit: ? Drink water, juice, or soup when you can drink without vomiting. ? Make sure you have little or no nausea before eating solid foods.   General instructions  Take over-the-counter and prescription medicines only as told by your health care provider.  Have a responsible adult stay with you for the time you are told. It is important to have someone help care for you until you are awake and alert.  Do  not smoke.  Keep all follow-up visits as told by your health care provider. This is important. Contact a health care provider if:  You are still sleepy or having trouble with balance after 24 hours.  You feel light-headed.  You keep feeling nauseous or you keep vomiting.  You develop a rash.  You have a fever.  You have redness or swelling around the IV site. Get help right away if:  You have trouble breathing.  You have new-onset confusion at home. Summary  After the procedure, it is common to feel sleepy, have impaired judgment, or feel nauseous if you eat too soon.  Rest after you get home. Know the things you should not do after the procedure.  Follow the  diet recommended by your health care provider and drink enough fluid to keep your urine pale yellow.  Get help right away if you have trouble breathing or new-onset confusion at home. This information is not intended to replace advice given to you by your health care provider. Make sure you discuss any questions you have with your health care provider. Document Revised: 09/28/2019 Document Reviewed: 04/26/2019 Elsevier Patient Education  2021 [employer].

## 2020-08-21 NOTE — H&P (Signed)
Referring Physician(s): Gatha Mayer  Supervising Physician: Jacqulynn Cadet  Patient Status:  [facility]  Chief Complaint:  "I'm here for a liver biopsy"   Subjective: Patient familiar to IR service from random liver biopsy in 2018.  She has a history of autoimmune hepatitis, partially treated, and recent abdominal ultrasound revealing increased hepatic echogenicity with coarsened hepatic echotexture but no focal hepatic lesion.  She also has elevated liver function tests which are increasing .She presents today for image guided random core liver biopsy for further evaluation / rule out cirrhosis.  Patient had Covid -40 in January of this year.  Additional history as below.  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 Carlean Purl, adenimatous polyps, repeat in 7 yr   ESOPHAGOGASTRODUODENOSCOPY  02/2002   ESOPHAGOGASTRODUODENOSCOPY (EGD) WITH PROPOFOL N/A 06/20/2017   Procedure: ESOPHAGOGASTRODUODENOSCOPY (EGD) WITH PROPOFOL;  Surgeon: Gatha Mayer, MD;  Location: [facility];   Service: Endoscopy;  Laterality: N/A;  with Barrx   EYE SURGERY     NASAL SEPTOPLASTY W/ TURBINOPLASTY     POLYPECTOMY  2015   CG-TA   TONSILLECTOMY        Allergies: Fentanyl, Levofloxacin, Prednisone, Amoxicillin, Augmentin [amoxicillin-pot clavulanate], Farxiga [dapagliflozin], and Statins  Medications: Prior to Admission medications   Medication Sig Start Date End Date Taking? Authorizing Provider  ALPRAZolam Duanne Moron) 0.5 MG tablet TAKE 1 TABLET(0.5 MG) BY MOUTH AT BEDTIME AS NEEDED FOR SLEEP 08/05/20  Yes Laurey Morale, MD  aspirin 325 MG tablet Take 650-975 mg by mouth daily as needed for moderate pain or headache.    Yes [provider]  cyclobenzaprine (FLEXERIL) 10 MG tablet TAKE 1 TABLET BY MOUTH EVERY 8 HOURS AS NEEDED FOR MUSCLE SPASMS WITH ONE BEING AT BEDTIME IF NEEDED 10/16/19  Yes Laurey Morale, MD  diphenhydrAMINE (BENADRYL) 25 mg capsule Take 25-50 mg by mouth daily as needed for allergies.    Yes [provider]  EFFEXOR XR 75 MG 24 hr capsule TAKE 1 CAPSULE BY MOUTH TWICE DAILY WITH A MEAL 04/21/20  Yes Gatha Mayer, MD  ezetimibe (ZETIA) 10 MG tablet TAKE 1 TABLET(10 MG) BY MOUTH DAILY 03/19/20  Yes Elayne Snare, MD  ferrous gluconate (FERGON) 324 MG tablet Take 324 mg by mouth daily.   Yes [provider]  Fluticasone-Salmeterol (ADVAIR DISKUS) 100-50 MCG/DOSE AEPB Inhale 1 puff into the lungs  2 (two) times daily. Patient taking differently: Inhale 1 puff into the lungs 2 (two) times daily as needed. 06/11/19  Yes Laurey Morale, MD  furosemide (LASIX) 20 MG tablet TAKE 1 TABLET(20 MG) BY MOUTH DAILY AS NEEDED 05/28/20  Yes Laurey Morale, MD  lansoprazole (PREVACID) 30 MG capsule TAKE 1 CAPSULE(30 MG) BY MOUTH DAILY 05/06/20  Yes Gatha Mayer, MD  Multiple Vitamins-Minerals (HAIR SKIN AND NAILS FORMULA) TABS Take 1 tablet by mouth daily.   Yes [provider]  repaglinide (PRANDIN) 0.5 MG tablet TAKE 1 TABLET(0.5 MG) BY MOUTH  TWICE DAILY BEFORE A MEAL 06/27/20  Yes Elayne Snare, MD  sodium chloride (BRONCHO SALINE) inhaler solution Take 1 spray by nebulization as needed.   Yes [provider]  TRULICITY 3 BJ/4.7WG SOPN INJECT CONTENTS OF ONE SYRINGE($RemoveBefore'3MG'[id number]$ ) UNDER THE SKIN ONCE WEEKLY 07/10/20  Yes Elayne Snare, MD  Blood Glucose Monitoring Suppl ([place]) w/Device KIT Use Onetouch Verio Flex to check blood sugar once daily. 12/22/18   Elayne Snare, MD  diphenhydrAMINE-zinc acetate (BENADRYL) cream Apply 1 application topically daily as needed for itching.    [provider]  Lancets (ONETOUCH DELICA PLUS [id number]) MISC USE AS DIRECTED UP TO 4 TIMES DAILY AS DIRECTED 12/04/18   Elayne Snare, MD  montelukast (SINGULAIR) 10 MG tablet TAKE 1 TABLET BY MOUTH AT BEDTIME 12/03/19   Laurey Morale, MD  [facility] VERIO test strip USE TO CHECK BLOOD SUGAR ONCE DAILY 06/11/20   Elayne Snare, MD  phenylephrine (SUDAFED PE) 10 MG TABS tablet Take 10 mg by mouth every 4 (four) hours as needed.    [provider]  promethazine (PHENERGAN) 25 MG tablet Take 1 tablet (25 mg total) by mouth every 8 (eight) hours as needed for nausea or vomiting. 05/06/20   Gatha Mayer, MD     Vital Signs: BP 125/74    Pulse 88    Temp 98.3 F (36.8 C) (Oral)    Resp 16    Ht $R'5\' 6"'Qt$  (1.676 m)    Wt 131 lb (59.4 kg)    SpO2 98%    BMI 21.14 kg/m   Physical Exam awake, alert.  Chest clear to auscultation bilaterally.  Heart with regular rate/ rhythm, soft murmur; abdomen soft, positive bowel sounds, nontender.  No lower extremity edema.  Imaging: No results found.  Labs:  CBC: Recent Labs    11/30/19 1106 07/18/20 1244 08/21/20 1130  WBC 7.4 4.8 4.9  HGB 14.4 13.5 15.1*  HCT 40.5 38.7 43.5  PLT 150.0 127.0* 143*    COAGS: Recent Labs    07/28/20 1020  INR 1.3*    BMP: Recent Labs    11/30/19 1106 03/04/20 0926 07/14/20 1100  NA 135 138 136  K 3.4* 3.9 3.9  CL 100 98 99  CO2 30 32 33*   GLUCOSE 140* 151* 132*  BUN $Re'13 9 10  'Wev$ CALCIUM 9.1 9.1 9.3  CREATININE 0.53 0.55 0.44    LIVER FUNCTION TESTS: Recent Labs    11/30/19 1106 03/04/20 0926 05/06/20 1131 07/14/20 1100  BILITOT 1.3* 1.1 1.2 1.4*  AST 44* 38* 73* 76*  ALT 49* 39* 60* 58*  ALKPHOS 97 113 105 136*  PROT 7.5 7.8 8.1 8.1  ALBUMIN 4.0 4.1 4.0 3.7    Assessment and Plan: Patient with past medical history significant for vitamin D deficiency, iron deficiency anemia, IBS, hypertension, hyperlipidemia, colon polyps, hiatal hernia, GERD, GAVE, diabetes, anxiety/depression, COVID-19 in  January of this year and autoimmune hepatitis, partially treated, along with elevated liver function tests.  She underwent random core liver biopsy in 2018 revealing benign liver with chronic moderately active hepatitis with bridging fibrosis most consistent with autoimmune hepatitis.  She presents again today for image guided random core liver biopsy for further evaluation/rule out cirrhosis.Risks and benefits of procedure was discussed with the patient  including, but not limited to bleeding, infection, damage to adjacent structures or low yield requiring additional tests.  All of the questions were answered and there is agreement to proceed.  Consent signed and in chart.     Electronically Signed: D. Rowe Robert, PA-C 08/21/2020, 11:42 AM   I spent a total of 25 minutes at the the patient's bedside AND on the patient's hospital floor or unit, greater than 50% of which was counseling/coordinating care for image guided random core liver biopsy

## 2020-08-22 LAB — SURGICAL PATHOLOGY

## 2020-08-26 ENCOUNTER — Other Ambulatory Visit: Payer: Self-pay

## 2020-08-26 ENCOUNTER — Ambulatory Visit
Admission: RE | Admit: 2020-08-26 | Discharge: 2020-08-26 | Disposition: A | Discharge status: Home / Self Care | Payer: 59 | Source: Ambulatory Visit | Attending: Family Medicine | Admitting: Family Medicine

## 2020-08-26 DIAGNOSIS — Z1231 Encounter for screening mammogram for malignant neoplasm of breast: Secondary | ICD-10-CM

## 2020-08-29 ENCOUNTER — Encounter: Payer: Self-pay | Admitting: Internal Medicine

## 2020-08-29 ENCOUNTER — Ambulatory Visit: Payer: 59 | Admitting: Internal Medicine

## 2020-08-29 ENCOUNTER — Other Ambulatory Visit: Payer: 59

## 2020-08-29 VITALS — BP 124/88 | HR 88 | Ht 65.0 in | Wt 128.1 lb

## 2020-08-29 DIAGNOSIS — K754 Autoimmune hepatitis: Secondary | ICD-10-CM

## 2020-08-29 DIAGNOSIS — K31819 Angiodysplasia of stomach and duodenum without bleeding: Secondary | ICD-10-CM | POA: Diagnosis not present

## 2020-08-29 DIAGNOSIS — K7460 Unspecified cirrhosis of liver: Secondary | ICD-10-CM

## 2020-08-29 DIAGNOSIS — Z789 Other specified health status: Secondary | ICD-10-CM | POA: Insufficient documentation

## 2020-08-29 DIAGNOSIS — Z7289 Other problems related to lifestyle: Secondary | ICD-10-CM

## 2020-08-29 HISTORY — DX: Unspecified cirrhosis of liver: K74.60

## 2020-08-29 NOTE — Assessment & Plan Note (Addendum)
I explained the pathophysiology and nature of cirrhosis.  I have recommended an EGD for screening for varices but she wants to get over her colonoscopy and her liver biopsy first.  She is not inclined to do so today.  I will recheck her hepatitis A immunity status and if still nave recommend hepatitis A vaccination.  She is being referred to [facility].  Hopefully we can treat her hepatitis and stabilize level of cirrhosis.  I think I reassured her today she is a child's A 5 points and a meld score of 10.  So not advanced liver disease.  However she absolutely needs to stop all alcohol and I have recommended that to her.  That likely has contributed to her disease process.

## 2020-08-29 NOTE — Progress Notes (Signed)
Miranda Cochran 70 y.o. 08-17-1950 [id number]  Assessment & Plan:   Encounter Diagnoses  Name Primary?  . Autoimmune hepatitis treated with steroids ([place]) Yes  . 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 Alcohol use   . GAVE (gastric antral vascular ectasia)    Autoimmune hepatitis treated with steroids [facility]) The patient has been nonadherent to treatment recommendations in the past though I had worked with her and we treated her with budesonide.  She did try azathioprine but she had myalgia arthralgia nausea and vomiting.  I am going [facility] for [facility] help with treatment.  This is complicated by her diabetes so with prednisone use is really not something we would want to do I would think.  Perhaps she is a candidate for CellCept or some other autoimmune immunosuppressant treatment.    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 I explained the pathophysiology and nature of cirrhosis.  I have recommended an EGD for screening for varices but she wants to get over her colonoscopy and her liver biopsy first.  She is not inclined to do so today.  I will recheck her hepatitis A immunity status and if still nave recommend hepatitis A vaccination.  [facility].  Hopefully we can treat her hepatitis and stabilize level of cirrhosis.  I think I reassured her today she is a child's A 5 points and a meld score of 10.  So not advanced liver disease.  However she absolutely needs to stop all alcohol and I have recommended that to her.  That likely has contributed to her disease process.  I recommended we consider a different prep in the future for colonoscopy though she disagreed with that since she had had the prep previously and did not have problems.  Recall below she had post colonoscopy diarrhea issues and felt somewhat unwell.  I  explained that she is older and sicker than she was prior to the other ones and we should still consider a different prep when she has a need for another colonoscopy.  I will see her back after her liver clinic appointment or when she decides to go ahead with EGD [name] just to call when she is ready.  CC: Laurey Morale, MD  Roosevelt Locks, NP [facility]   Subjective:   Chief Complaint: Cirrhosis new diagnosis  HPI Brooklynne is a 70 year old white woman with autoimmune hepatitis history of regular alcohol use, diabetes mellitus and a personal history of colon polyps and iron deficiency anemia thought at least in part related to gastric antral vascular ectasia.  I have followed her and treated her over the years for her autoimmune hepatitis.  She declined immunomodulators therapy and she was treated with budesonide and had only mild transaminase elevation after her initial diagnosis.  However lately her platelet count was noted to be low.  Imaging was done and demonstrated increased hepatic echogenicity with coarsened hepatic echotexture suggestive of possible cirrhosis on abdominal ultrasound in February and the liver Doppler suggested liver morphology with splenomegaly though she had patent portal vein and normal hepatopetal flow.  I had her do a liver biopsy this was done 08/21/2020.  We reviewed all of this information and her current labs today.  We reviewed that she had been treated with budesonide and I talked about how she is intolerant of azathioprine.  She told me "I was intolerant of the dose you gave me".  She also related that she had diarrhea problems after her colonoscopy this time and took 2 or 3 weeks to get over it. FINAL MICROSCOPIC DIAGNOSIS:   A. LIVER, BIOPSY:  - Cirrhosis (stage 4 of 4). See comment  - Chronic hepatitis with moderate activity (grade 2-3 of 4), consistent  with history of autoimmune hepatitis     COMMENT:   Liver core biopsies show  well-developed septation and nodularity with  fibrotic parenchymal collapse. Trichrome and reticulin stains highlights  the well-developed fibrous septa and parenchymal nodularity. The septa  contain a moderate mononuclear cell infiltrate with moderate interface  activity. The infiltrate is composed of lymphocytes and numerous plasma  cells are admixed. Septal biliary and vascular structures are  histologically unremarkable. Iron stain is negative for stainable iron.  PASD stain is negative for globular intracytoplasmic inclusions. The  histologic findings are consistent with patient's clinical history and  show autoimmune hepatitis with moderate activity (grade 2-3 of 4) and  cirrhosis.    She had a history of regular alcohol use and at 1 point in the past we thought that that was most likely driving her liver test abnormalities.  She had stopped drinking but then started drinking again sometime in the last year or 2.  She was "only drinking wine" but would drink 6 or 7 glasses perhaps more a week.  When I asked her today if she is still drinking she just says some champagne.  She is quite concerned thinking she may be moving into liver failure. Allergies  Allergen Reactions  . Fentanyl Anxiety    Made her feel very anxious, requests not to get it.  . Levofloxacin Anxiety    Weird dreams   . Prednisone     Anxiety and paranoia  . Amoxicillin Diarrhea    Has patient had a PCN reaction causing immediate rash, facial/tongue/throat swelling, SOB or lightheadedness with hypotension: No Has patient had a PCN reaction causing severe rash involving mucus membranes or skin necrosis: No Has patient had a PCN reaction that required hospitalization: No Has patient had a PCN reaction occurring within the last 10 years: Yes If all of the above answers are "NO", then may proceed with Cephalosporin use.   . Augmentin [Amoxicillin-Pot Clavulanate] Other (See Comments)    diarrhea  . Wilder Glade  [Dapagliflozin] Diarrhea    Gi pain  . Statins     Myalgia    Current Meds  Medication Sig  . ALPRAZolam (XANAX) 0.5 MG tablet TAKE 1 TABLET(0.5 MG) BY MOUTH AT BEDTIME AS NEEDED FOR SLEEP  . AMBULATORY NON FORMULARY MEDICATION ACTIVE LIVER Artichoke leaf extract, Milk thistle, Turmeric Take 1 capsule by mouth once daily  . aspirin 325 MG tablet Take 650-975 mg by mouth daily as needed for moderate pain or headache.   . Blood Glucose Monitoring Suppl (ONETOUCH VERIO FLEX SYSTEM) w/Device KIT Use Onetouch Verio Flex to check blood sugar once daily.  . cyclobenzaprine (FLEXERIL) 10 MG tablet TAKE 1 TABLET BY MOUTH EVERY 8 HOURS AS NEEDED FOR MUSCLE SPASMS WITH ONE BEING AT BEDTIME IF NEEDED  . diphenhydrAMINE (BENADRYL) 25 mg capsule Take 25-50 mg by mouth daily as needed for allergies.   . diphenhydrAMINE-zinc acetate (BENADRYL) cream Apply 1 application topically daily as needed for itching.  . EFFEXOR XR 75 MG 24 hr capsule TAKE 1 CAPSULE BY MOUTH TWICE DAILY WITH A MEAL  . ezetimibe (ZETIA) 10 MG  tablet TAKE 1 TABLET(10 MG) BY MOUTH DAILY  . ferrous gluconate (FERGON) 324 MG tablet Take 324 mg by mouth daily.  . Fluticasone-Salmeterol (ADVAIR DISKUS) 100-50 MCG/DOSE AEPB Inhale 1 puff into the lungs 2 (two) times daily. (Patient taking differently: Inhale 1 puff into the lungs 2 (two) times daily as needed.)  . furosemide (LASIX) 20 MG tablet TAKE 1 TABLET(20 MG) BY MOUTH DAILY AS NEEDED  . Lancets (ONETOUCH DELICA PLUS [id number]) MISC USE AS DIRECTED UP TO 4 TIMES DAILY AS DIRECTED  . lansoprazole (PREVACID) 30 MG capsule TAKE 1 CAPSULE(30 MG) BY MOUTH DAILY  . montelukast (SINGULAIR) 10 MG tablet TAKE 1 TABLET BY MOUTH AT BEDTIME  . Multiple Vitamins-Minerals (HAIR SKIN AND NAILS FORMULA) TABS Take 1 tablet by mouth daily.  Glory Rosebush VERIO test strip USE TO CHECK BLOOD SUGAR ONCE DAILY  . phenylephrine (SUDAFED PE) 10 MG TABS tablet Take 10 mg by mouth every 4 (four) hours as needed.   . promethazine (PHENERGAN) 25 MG tablet Take 1 tablet (25 mg total) by mouth every 8 (eight) hours as needed for nausea or vomiting.  . repaglinide (PRANDIN) 0.5 MG tablet TAKE 1 TABLET(0.5 MG) BY MOUTH TWICE DAILY BEFORE A MEAL  . sodium chloride (BRONCHO SALINE) inhaler solution Take 1 spray by nebulization as needed.  . TRULICITY 3 WU/9.8JX SOPN INJECT CONTENTS OF ONE SYRINGE($RemoveBefore'3MG'tqonLjTqEKYEA$ ) UNDER THE SKIN ONCE WEEKLY   Past Medical History:  Diagnosis Date  . Allergy   . Anxiety    on meds  . Autoimmune hepatitis ([place]) 08/18/2016   08/2016 liver bx confirms, fibrosis not cirrhosis  . COVID 06/27/2020  . Depression    on meds  . DM (diabetes mellitus) ([place])   . 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 GERD (gastroesophageal reflux disease)    on meds  . Hiatal hernia   . Hx of adenomatous polyp of colon 07/07/2009  . Hyperlipidemia    diet controlled  . Hypertension    on meds - LASIX  . IBS (irritable bowel syndrome)   . Iron deficiency anemia   . 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 Sinusitis, chronic   . Thyroid nodule   . Umbilical hernia   . Uterine fibroid   . Vascular disease   . Vitamin D deficiency    Past Surgical History:  Procedure Laterality Date  . BREAST EXCISIONAL BIOPSY Left   . BREAST EXCISIONAL BIOPSY Right   . CATARACT EXTRACTION Bilateral   . COLONOSCOPY  05/23/2014   per Dr. Carlean Purl, adenimatous polyps, repeat in 7 yr  . ESOPHAGOGASTRODUODENOSCOPY  02/2002  . ESOPHAGOGASTRODUODENOSCOPY (EGD) WITH PROPOFOL N/A 06/20/2017   Procedure: ESOPHAGOGASTRODUODENOSCOPY (EGD) WITH PROPOFOL;  Surgeon: Gatha Mayer, MD;  Location: [facility];  Service: Endoscopy;  Laterality: N/A;  with Barrx  . EYE SURGERY    . NASAL SEPTOPLASTY W/ TURBINOPLASTY    . POLYPECTOMY  2015   CG-TA  . 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   Review of Systems As per HPI  Objective:   Physical Exam BP 124/88 (BP Location: Left Arm, Patient Position: Sitting, Cuff Size: Normal)   Pulse 88   Ht $R'5\' 5"'hI$  (1.651 m)   Wt 128 lb 2 oz (58.1 kg)   BMI 21.32 kg/m  Thin white woman no acute distress Eyes anicteric Lungs clear Heart sounds are normal The abdomen is soft slightly protuberant no stigmata of chronic liver disease there are in the skin, question if the liver is slightly palpable in the right upper quadrant versus subcutaneous tissue no splenomegaly palpated

## 2020-08-29 NOTE — Assessment & Plan Note (Signed)
The patient has been nonadherent to treatment recommendations in the past though I had worked with her and we treated her with budesonide.  She did try azathioprine but she had myalgia arthralgia nausea and vomiting.  I am [facility] help with treatment.  This is complicated by her diabetes so with prednisone use is really not something we would want to do I would think.  Perhaps she is a candidate for CellCept or some other autoimmune immunosuppressant treatment.

## 2020-08-29 NOTE — Patient Instructions (Addendum)
I am referring you to [facility] office of [facility].  Roosevelt Locks, NP will see you.  Stop all alcohol as we discussed.  Let us know when you will schedule the upper endoscopy.  Checking hepatitis A immunity today.  Hang in there - if we hold this here things can be ok.  Your provider has requested that you go to the basement level for lab work before leaving today. Press "B" on the elevator. The lab is located at the first door on the left as you exit the elevator.  Due to recent changes in healthcare laws, you may see the results of your imaging and laboratory studies on MyChart before your provider has had a chance to review them.  We understand that in some cases there may be results that are confusing or concerning to you. Not all laboratory results come back in the same time frame and the provider may be waiting for multiple results in order to interpret others.  Please give [place] 48 hours in order for your provider to thoroughly review all the results before contacting the office for clarification of your results.    I appreciate the opportunity to care for you. Gatha Mayer, MD, [facility]

## 2020-09-01 LAB — HEPATITIS A ANTIBODY, TOTAL: Hepatitis A AB,Total: NONREACTIVE

## 2020-09-02 ENCOUNTER — Other Ambulatory Visit: Payer: Self-pay

## 2020-09-02 MED ORDER — HEPATITIS A VACCINE 1440 EL U/ML IM SUSP: 1.0000 mL | Freq: Once | INTRAMUSCULAR | 1 refills | Status: AC

## 2020-09-04 ENCOUNTER — Encounter: Payer: Self-pay | Admitting: Family Medicine

## 2020-09-04 ENCOUNTER — Telehealth (INDEPENDENT_AMBULATORY_CARE_PROVIDER_SITE_OTHER): Payer: 59 | Admitting: Family Medicine

## 2020-09-04 VITALS — Wt 128.0 lb

## 2020-09-04 DIAGNOSIS — E1165 Type 2 diabetes mellitus with hyperglycemia: Secondary | ICD-10-CM

## 2020-09-04 DIAGNOSIS — K754 Autoimmune hepatitis: Secondary | ICD-10-CM

## 2020-09-04 NOTE — Progress Notes (Signed)
Subjective:    [name], female    DOB: Oct 07, 1950, 70 y.o.   MRN: [id number]  HPI Virtual Visit via Video Note  I connected with the patient on 09/04/20 at  8:45 AM EDT by a video enabled telemedicine application and verified that I am speaking with the correct person using two identifiers.  Location patient: home Location provider:work or home office Persons participating in the virtual visit: patient, provider  I discussed the limitations of evaluation and management by telemedicine and the availability of in person appointments. The patient expressed understanding and agreed to proceed.   HPI: Here to ask for a medical clearance letter for her to receive UL therapy at [facility]. This is an [place] procedure on her face to break up collagen deposits under the skin, and thereby to reduce wrinkles. She is douing well in general although her autoimmune hepatitis has been a challenge. Dr. Carlean Purl has referred her to a liver specialist at [facility] for this. Her BP is stable and her diabetes has been very well controlled. Her last A1c in January was 5.5.    ROS: See pertinent positives and negatives per HPI.  Past Medical History:  Diagnosis Date  . Allergy   . Anxiety    on meds  . Autoimmune hepatitis ([place]) 08/18/2016   08/2016 liver bx confirms, fibrosis not cirrhosis  . 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 08/29/2020  . COVID 06/27/2020  . Depression    on meds  . DM (diabetes mellitus) ([place])   . 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 GERD (gastroesophageal reflux disease)    on meds  . Hiatal hernia   . Hx of adenomatous polyp of colon 07/07/2009  . Hyperlipidemia    diet controlled  . Hypertension    on meds - LASIX  . IBS (irritable bowel syndrome)   . Iron deficiency anemia   . 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 Sinusitis, chronic   . Thyroid nodule   .  Umbilical hernia   . Uterine fibroid   . Vascular disease   . Vitamin D deficiency     Past Surgical History:  Procedure Laterality Date  . BREAST EXCISIONAL BIOPSY Left   . BREAST EXCISIONAL BIOPSY Right   . CATARACT EXTRACTION Bilateral   . COLONOSCOPY  05/23/2014   per Dr. Carlean Purl, adenimatous polyps, repeat in 7 yr  . ESOPHAGOGASTRODUODENOSCOPY  02/2002  . ESOPHAGOGASTRODUODENOSCOPY (EGD) WITH PROPOFOL N/A 06/20/2017   Procedure: ESOPHAGOGASTRODUODENOSCOPY (EGD) WITH PROPOFOL;  Surgeon: Gatha Mayer, MD;  Location: [facility];  Service: Endoscopy;  Laterality: N/A;  with Barrx  . EYE SURGERY    . NASAL SEPTOPLASTY W/ TURBINOPLASTY    . POLYPECTOMY  2015   CG-TA  . TONSILLECTOMY      Family History  Problem Relation Age of Onset  . Stroke Mother   . Hypertension Mother   . Colon cancer Father 28  . Liver cancer Father 69  . Colon polyps Father 51  . Heart disease Sister        hole in heart  . Stroke Maternal Grandmother   . Hypertension Maternal Grandmother   . Esophageal cancer Maternal Grandfather   . Throat cancer Maternal Grandfather   . Breast cancer Paternal Grandmother   . Brain cancer Paternal Grandmother   . Lung cancer Paternal Grandfather   . Pancreatic cancer Maternal Aunt   .  Brain cancer Maternal Uncle   . Diabetes Neg Hx   . Rectal cancer Neg Hx   . Stomach cancer Neg Hx      Current Outpatient Medications:  .  ALPRAZolam (XANAX) 0.5 MG tablet, TAKE 1 TABLET(0.5 MG) BY MOUTH AT BEDTIME AS NEEDED FOR SLEEP, Disp: 30 tablet, Rfl: 5 .  AMBULATORY NON FORMULARY MEDICATION, ACTIVE LIVER Artichoke leaf extract, Milk thistle, Turmeric Take 1 capsule by mouth once daily, Disp: , Rfl:  .  aspirin 325 MG tablet, Take 650-975 mg by mouth daily as needed for moderate pain or headache. , Disp: , Rfl:  .  Blood Glucose Monitoring Suppl (ONETOUCH VERIO FLEX SYSTEM) w/Device KIT, Use Onetouch Verio Flex to check blood sugar once daily., Disp: 1 kit, Rfl: 0 .   cyclobenzaprine (FLEXERIL) 10 MG tablet, TAKE 1 TABLET BY MOUTH EVERY 8 HOURS AS NEEDED FOR MUSCLE SPASMS WITH ONE BEING AT BEDTIME IF NEEDED, Disp: 60 tablet, Rfl: 5 .  diphenhydrAMINE (BENADRYL) 25 mg capsule, Take 25-50 mg by mouth daily as needed for allergies. , Disp: , Rfl:  .  diphenhydrAMINE-zinc acetate (BENADRYL) cream, Apply 1 application topically daily as needed for itching., Disp: , Rfl:  .  EFFEXOR XR 75 MG 24 hr capsule, TAKE 1 CAPSULE BY MOUTH TWICE DAILY WITH A MEAL, Disp: 60 capsule, Rfl: 2 .  ezetimibe (ZETIA) 10 MG tablet, TAKE 1 TABLET(10 MG) BY MOUTH DAILY, Disp: 30 tablet, Rfl: 3 .  ferrous gluconate (FERGON) 324 MG tablet, Take 324 mg by mouth daily., Disp: , Rfl:  .  Fluticasone-Salmeterol (ADVAIR DISKUS) 100-50 MCG/DOSE AEPB, Inhale 1 puff into the lungs 2 (two) times daily. (Patient taking differently: Inhale 1 puff into the lungs 2 (two) times daily as needed.), Disp: 60 each, Rfl: 11 .  furosemide (LASIX) 20 MG tablet, TAKE 1 TABLET(20 MG) BY MOUTH DAILY AS NEEDED, Disp: 30 tablet, Rfl: 3 .  Lancets (ONETOUCH DELICA PLUS [id number]) MISC, USE AS DIRECTED UP TO 4 TIMES DAILY AS DIRECTED, Disp: 100 each, Rfl: 0 .  lansoprazole (PREVACID) 30 MG capsule, TAKE 1 CAPSULE(30 MG) BY MOUTH DAILY, Disp: 90 capsule, Rfl: 3 .  montelukast (SINGULAIR) 10 MG tablet, TAKE 1 TABLET BY MOUTH AT BEDTIME, Disp: 90 tablet, Rfl: 1 .  Multiple Vitamins-Minerals (HAIR SKIN AND NAILS FORMULA) TABS, Take 1 tablet by mouth daily., Disp: , Rfl:  .  ONETOUCH VERIO test strip, USE TO CHECK BLOOD SUGAR ONCE DAILY, Disp: 100 strip, Rfl: 5 .  phenylephrine (SUDAFED PE) 10 MG TABS tablet, Take 10 mg by mouth every 4 (four) hours as needed., Disp: , Rfl:  .  promethazine (PHENERGAN) 25 MG tablet, Take 1 tablet (25 mg total) by mouth every 8 (eight) hours as needed for nausea or vomiting., Disp: 30 tablet, Rfl: 1 .  repaglinide (PRANDIN) 0.5 MG tablet, TAKE 1 TABLET(0.5 MG) BY MOUTH TWICE DAILY BEFORE A  MEAL, Disp: 60 tablet, Rfl: 2 .  sodium chloride (BRONCHO SALINE) inhaler solution, Take 1 spray by nebulization as needed., Disp: , Rfl:  .  TRULICITY 3 LX/7.2IO SOPN, INJECT CONTENTS OF ONE SYRINGE(3MG) UNDER THE SKIN ONCE WEEKLY, Disp: 2 mL, Rfl: 1  EXAM:  VITALS per patient if applicable:  GENERAL: alert, oriented, appears well and in no acute distress  HEENT: atraumatic, conjunttiva clear, no obvious abnormalities on inspection of external nose and ears  NECK: normal movements of the head and neck  LUNGS: on inspection no signs of respiratory distress, breathing rate appears normal,  no obvious gross SOB, gasping or wheezing  CV: no obvious cyanosis  MS: moves all visible extremities without noticeable abnormality  PSYCH/NEURO: pleasant and cooperative, no obvious depression or anxiety, speech and thought processing grossly intact  ASSESSMENT AND PLAN: Her medical conditions are stable, so I did write a letter to clear her for the facial therapy. Recheck as needed.  Alysia Penna, MD   Discussed the following assessment and plan:  No diagnosis found.     I discussed the assessment and treatment plan with the patient. The patient was provided an opportunity to ask questions and all were answered. The patient agreed with the plan and demonstrated an understanding of the instructions.   The patient was advised to call back or seek an in-person evaluation if the symptoms worsen or if the condition fails to improve as anticipated.     Review of Systems     Objective:   Physical Exam        Assessment & Plan:

## 2020-09-05 ENCOUNTER — Ambulatory Visit (HOSPITAL_COMMUNITY): Payer: 59

## 2020-09-06 ENCOUNTER — Other Ambulatory Visit: Payer: Self-pay | Admitting: Endocrinology

## 2020-09-25 ENCOUNTER — Other Ambulatory Visit: Payer: Self-pay | Admitting: Family Medicine

## 2020-09-25 ENCOUNTER — Other Ambulatory Visit: Payer: Self-pay | Admitting: Endocrinology

## 2020-09-25 ENCOUNTER — Other Ambulatory Visit: Payer: Self-pay

## 2020-09-25 MED ORDER — FUROSEMIDE 20 MG PO TABS: ORAL_TABLET | ORAL | 3 refills | Status: DC

## 2020-10-22 ENCOUNTER — Other Ambulatory Visit: Payer: Self-pay

## 2020-10-23 ENCOUNTER — Encounter: Payer: Self-pay | Admitting: Family Medicine

## 2020-10-23 ENCOUNTER — Ambulatory Visit: Payer: 59 | Admitting: Family Medicine

## 2020-10-23 VITALS — BP 126/78 | HR 82 | Temp 98.4°F | Wt 129.6 lb

## 2020-10-23 DIAGNOSIS — T887XXA Unspecified adverse effect of drug or medicament, initial encounter: Secondary | ICD-10-CM | POA: Diagnosis not present

## 2020-10-23 DIAGNOSIS — K7460 Unspecified cirrhosis of liver: Secondary | ICD-10-CM | POA: Diagnosis not present

## 2020-10-23 DIAGNOSIS — R011 Cardiac murmur, unspecified: Secondary | ICD-10-CM

## 2020-10-23 NOTE — Progress Notes (Signed)
   Subjective:    [name], female    DOB: 19-Jan-1951, 70 y.o.   MRN: [id number]  HPI Here to get my opinion about symptoms that are likely side effects of medications. She has autoimmune hepatitis with early cirrhosis, and she has been seeing Dr. Carlean Purl and Roosevelt Locks NP at the [facility]. She has tried several immune modulating medications, and she has not tolerated most of them. She has tried Budesonide, Imuran, and most recently Cellcept but she has had reactions to all these. She was started on Cellcept 3 weeks ago, and after one week she began to have painful rashes on the face, chest, and hands. She also developed headaches, chills, vision disturbances, itching, and nausea. No SOB or wheezing. She spoke to the liver clinic and they agreed she could stop the Cellcept. She stopped this 2 days ago, and already she feels much better. The chills and nausea and headache went away, and the rashes are clearing up.    Review of Systems  Constitutional: Negative.   Respiratory: Negative.   Cardiovascular: Negative.   Gastrointestinal: Negative.   Genitourinary: Negative.   Skin: Positive for rash.       Objective:   Physical Exam Constitutional:      Appearance: Normal appearance. She is not ill-appearing.  Cardiovascular:     Rate and Rhythm: Normal rate.     Pulses: Normal pulses.     Comments: She has a new 2/6 SM  Pulmonary:     Effort: Pulmonary effort is normal.     Breath sounds: Normal breath sounds.  Abdominal:     General: Abdomen is flat. Bowel sounds are normal. There is no distension.     Palpations: Abdomen is soft. There is no mass.     Tenderness: There is no abdominal tenderness. There is no guarding or rebound.     Hernia: No hernia is present.  Skin:    Comments: There are small patches of skin which is red, crusty, and drying up on the eyelids, upper chest, and hands   Neurological:     Mental Status: She is alert.            Assessment & Plan:  She has definitely had side effects as above to the Cellcept and I am glad she stopped taking this. This medication should be avoided in the future. All the reactions seem to be clearing up as expected. She also has a new cardiac murmur (I do not think this is related), and we will evaluate this with an ECHO soon. We spent 45 minutes discussing this issues today.  Alysia Penna, MD

## 2020-10-24 ENCOUNTER — Other Ambulatory Visit: Payer: Self-pay

## 2020-10-24 ENCOUNTER — Telehealth: Payer: Self-pay | Admitting: Family Medicine

## 2020-10-24 MED ORDER — FUROSEMIDE 40 MG PO TABS: ORAL_TABLET | ORAL | 3 refills | Status: DC

## 2020-10-24 NOTE — Telephone Encounter (Signed)
Spoke with pt verbalized understanding, New Rx sent to pt pharmacy

## 2020-10-24 NOTE — Telephone Encounter (Signed)
[facility] is putting her on predisone 30 MG for her liver.  Pt is calling to see if she can get a increase of her furosemide (LASIX) 20 MG due to the infection in her liver. Pt would like to have a call back.

## 2020-10-24 NOTE — Telephone Encounter (Signed)
Increase the Lasix to 40 mg daily, call in #[phone] with 3 rf 

## 2020-11-10 ENCOUNTER — Other Ambulatory Visit: Payer: Self-pay | Admitting: Endocrinology

## 2020-11-11 ENCOUNTER — Ambulatory Visit (HOSPITAL_COMMUNITY)
Admission: RE | Admit: 2020-11-11 | Discharge: 2020-11-11 | Disposition: A | Discharge status: Home / Self Care | Payer: 59 | Source: Ambulatory Visit | Attending: Family Medicine | Admitting: Family Medicine

## 2020-11-11 ENCOUNTER — Other Ambulatory Visit: Payer: Self-pay

## 2020-11-11 DIAGNOSIS — R011 Cardiac murmur, unspecified: Secondary | ICD-10-CM | POA: Diagnosis not present

## 2020-11-11 DIAGNOSIS — I10 Essential (primary) hypertension: Secondary | ICD-10-CM | POA: Insufficient documentation

## 2020-11-11 DIAGNOSIS — I361 Nonrheumatic tricuspid (valve) insufficiency: Secondary | ICD-10-CM

## 2020-11-11 DIAGNOSIS — E119 Type 2 diabetes mellitus without complications: Secondary | ICD-10-CM | POA: Diagnosis not present

## 2020-11-11 DIAGNOSIS — I082 Rheumatic disorders of both aortic and tricuspid valves: Secondary | ICD-10-CM | POA: Insufficient documentation

## 2020-11-11 DIAGNOSIS — E785 Hyperlipidemia, unspecified: Secondary | ICD-10-CM | POA: Diagnosis not present

## 2020-11-11 LAB — ECHOCARDIOGRAM COMPLETE
Area-P 1/2: 3.6 cm2
S' Lateral: 2.5 cm

## 2020-11-11 NOTE — Progress Notes (Signed)
  Echocardiogram 2D Echocardiogram has been performed.  Jennette Dubin 11/11/2020, 10:08 AM

## 2020-11-18 ENCOUNTER — Other Ambulatory Visit (INDEPENDENT_AMBULATORY_CARE_PROVIDER_SITE_OTHER): Payer: 59

## 2020-11-18 ENCOUNTER — Other Ambulatory Visit: Payer: Self-pay

## 2020-11-18 DIAGNOSIS — E119 Type 2 diabetes mellitus without complications: Secondary | ICD-10-CM | POA: Diagnosis not present

## 2020-11-18 DIAGNOSIS — E041 Nontoxic single thyroid nodule: Secondary | ICD-10-CM | POA: Diagnosis not present

## 2020-11-18 LAB — TSH: TSH: 2.76 u[IU]/mL (ref 0.35–4.50)

## 2020-11-18 LAB — HEMOGLOBIN A1C: Hgb A1c MFr Bld: 7.2 % — ABNORMAL HIGH (ref 4.6–6.5)

## 2020-11-19 LAB — BASIC METABOLIC PANEL
BUN: 14 mg/dL (ref 6–23)
CO2: 27 mEq/L (ref 19–32)
Calcium: 9.4 mg/dL (ref 8.4–10.5)
Chloride: 98 mEq/L (ref 96–112)
Creatinine, Ser: 0.62 mg/dL (ref 0.40–1.20)
GFR: 91.41 mL/min (ref >60.00)
Glucose, Bld: 120 mg/dL — ABNORMAL HIGH (ref 70–99)
Potassium: 3.9 mEq/L (ref 3.5–5.1)
Sodium: 136 mEq/L (ref 135–145)

## 2020-11-19 LAB — FRUCTOSAMINE: Fructosamine: 379 umol/L — ABNORMAL HIGH (ref 0–285)

## 2020-11-20 ENCOUNTER — Ambulatory Visit: Payer: 59 | Admitting: Endocrinology

## 2020-11-25 ENCOUNTER — Encounter: Payer: Self-pay | Admitting: Endocrinology

## 2020-11-25 ENCOUNTER — Ambulatory Visit: Payer: 59 | Admitting: Endocrinology

## 2020-11-25 ENCOUNTER — Other Ambulatory Visit: Payer: Self-pay

## 2020-11-25 VITALS — BP 140/88 | HR 67 | Ht 65.0 in | Wt 127.8 lb

## 2020-11-25 DIAGNOSIS — E1165 Type 2 diabetes mellitus with hyperglycemia: Secondary | ICD-10-CM

## 2020-11-25 MED ORDER — REPAGLINIDE 1 MG PO TABS: 1.0000 mg | ORAL_TABLET | Freq: Two times a day (BID) | ORAL | 2 refills | Status: DC

## 2020-11-25 NOTE — Progress Notes (Signed)
[name], female   DOB: June 12, 1951, 70 y.o.   MRN: [id number]          Reason for Appointment: Follow-up for Type 2 Diabetes    History of Present Illness:          Date of diagnosis of type 2 diabetes mellitus: ?  2013        Background history:   She had a blood sugar over 200 in 2013 but was not told to have diabetes until 04/2015 A1c was 7.3 in 3/16 In 04/2015 blood sugar was 478 says her blood sugar was 600 when she was diagnosed and was not having any significant symptoms Initially treated with Metaglip but this apparently caused abdominal discomfort and she was switched to metformin In 2017 she had been tried on Bydureon for better control but she stopped this after a few weeks because of large persisted nodules in her skin She thinks her blood sugars were better with this and A1c was down to 6.8  Recent history:   Non-insulin hypoglycemic drugs the patient is taking are: Trulicity 3 mg weekly, Prandin 1 mg twice daily  Her A1c is usually falsely low and is now 7.2, previously 5.5 compared to 6  Fructosamine higher at 379, previously 302   Current management, blood sugar patterns and problems identified:   She has had treatment with steroids recently starting with 30 mg and now on 20 mg prednisone However not clear why her A1c is much higher since she has only occasional higher readings No side effects with prednisone Unable to assess her blood sugars after meals and she is checking blood sugars mostly before her first meal which could be at 12 noon She had a high reading of 281 and she does not know what she had to eat or drink at that time she will sometimes have regular sweet drinks Her weight is very slightly lower. She says because of her high sugars she is sometimes taking 2 Prandin tablets at a time instead of a single 0.5 mg tablet She does not see the dietitian because of cost Does not exercise consistently lately        Side effects from  medications have been: Metaglip caused abdominal pain, Tradjenta caused swelling of the legs, Bydureon caused skin nodules, Farxiga caused diarrhea, glipizide causes dizziness and weakness, Metformin caused GI side effects, Invokana causes yeast infections  ompliance with the medical regimen: Inconsistent  Glucose monitoring:  done less than 1 times a day         Glucometer: One Verio     Blood Glucose readings by time of day by monitor download:   PRE-MEAL Fasting Lunch Dinner Bedtime Overall  Glucose range: 92-179    92-281  Mean/median:     169   POST-MEAL PC Breakfast PC Lunch PC Dinner  Glucose range:   162, 281  Mean/median:       PREVIOUS blood sugar range 111-1 246 with only 1 high reading AVERAGE 154 with most readings between 10 AM-2 PM    Self-care:  Typical meal intake: Breakfast is Oatmeal, usually eating low-fat dinner and her snacks will be fruit, peanut butter crackers                Dietician visit, most recent: None               Weight history:  Wt Readings from Last 3 Encounters:  11/25/20 127 lb 12.8 oz (58 kg)  10/23/20  129 lb 9.6 oz (58.8 kg)  09/04/20 128 lb (58.1 kg)    Glycemic control:   Lab Results  Component Value Date   HGBA1C 7.2 (H) 11/18/2020   HGBA1C 5.5 07/14/2020   HGBA1C 6.0 03/04/2020   Lab Results  Component Value Date   MICROALBUR 2.2 (H) 07/14/2020   LDLCALC 97 07/14/2020   CREATININE 0.62 11/18/2020   Lab Results  Component Value Date   MICRALBCREAT 10.2 07/14/2020    Lab Results  Component Value Date   FRUCTOSAMINE 379 (H) 11/18/2020   FRUCTOSAMINE 302 (H) 11/30/2019   FRUCTOSAMINE 284 06/04/2019    No visits with results within 1 Week(s) from this visit.  Latest known visit with results is:  Lab on 11/18/2020  Component Date Value Ref Range Status   Fructosamine 11/18/2020 379 (A) 0 - 285 umol/L Final   Comment: Published reference interval for apparently healthy subjects between age 72 and 32 is 69 - 285  umol/L and in a poorly controlled diabetic population is 228 - 563 umol/L with a mean of 396 umol/L.    TSH 11/18/2020 2.76  0.35 - 4.50 uIU/mL Final   Sodium 11/18/2020 136  135 - 145 mEq/L Final   Potassium 11/18/2020 3.9  3.5 - 5.1 mEq/L Final   Chloride 11/18/2020 98  96 - 112 mEq/L Final   CO2 11/18/2020 27  19 - 32 mEq/L Final   Glucose, Bld 11/18/2020 120 (A) 70 - 99 mg/dL Final   BUN 11/18/2020 14  6 - 23 mg/dL Final   Creatinine, Ser 11/18/2020 0.62  0.40 - 1.20 mg/dL Final   GFR 11/18/2020 91.41  >60.00 mL/min Final   Calculated using the CKD-EPI Creatinine Equation (2021)   Calcium 11/18/2020 9.4  8.4 - 10.5 mg/dL Final   Hgb A1c MFr Bld 11/18/2020 7.2 (A) 4.6 - 6.5 % Final   Glycemic Control Guidelines for People with Diabetes:Non Diabetic:  <6%Goal of Therapy: <7%Additional Action Suggested:  >8%      Allergies as of 11/25/2020       Reactions   Fentanyl Anxiety   Made her feel very anxious, requests not to get it.   Levofloxacin Anxiety   Weird dreams    Prednisone    Anxiety and paranoia   Amoxicillin Diarrhea   Has patient had a PCN reaction causing immediate rash, facial/tongue/throat swelling, SOB or lightheadedness with hypotension: No Has patient had a PCN reaction causing severe rash involving mucus membranes or skin necrosis: No Has patient had a PCN reaction that required hospitalization: No Has patient had a PCN reaction occurring within the last 10 years: Yes If all of the above answers are "NO", then may proceed with Cephalosporin use.   Augmentin [amoxicillin-pot Clavulanate] Other (See Comments)   diarrhea   Farxiga [dapagliflozin] Diarrhea   Gi pain   Statins    Myalgia         Medication List        Accurate as of November 25, 2020  8:55 AM. If you have any questions, ask your nurse or doctor.          ALPRAZolam 0.5 MG tablet Commonly known as: XANAX TAKE 1 TABLET(0.5 MG) BY MOUTH AT BEDTIME AS NEEDED FOR SLEEP   AMBULATORY NON  FORMULARY MEDICATION ACTIVE LIVER Artichoke leaf extract, Milk thistle, Turmeric Take 1 capsule by mouth once daily   aspirin 325 MG tablet Take 650-975 mg by mouth daily as needed for moderate pain or headache.   cyclobenzaprine  10 MG tablet Commonly known as: FLEXERIL TAKE 1 TABLET BY MOUTH EVERY 8 HOURS AS NEEDED FOR MUSCLE SPASMS WITH ONE BEING AT BEDTIME IF NEEDED   diphenhydrAMINE 25 mg capsule Commonly known as: BENADRYL Take 25-50 mg by mouth daily as needed for allergies.   diphenhydrAMINE-zinc acetate cream Commonly known as: BENADRYL Apply 1 application topically daily as needed for itching.   Effexor XR 75 MG 24 hr capsule Generic drug: venlafaxine XR TAKE 1 CAPSULE BY MOUTH TWICE DAILY WITH A MEAL   ezetimibe 10 MG tablet Commonly known as: ZETIA TAKE 1 TABLET(10 MG) BY MOUTH DAILY   ferrous gluconate 324 MG tablet Commonly known as: FERGON Take 324 mg by mouth daily.   Fluticasone-Salmeterol 100-50 MCG/DOSE Aepb Commonly known as: Advair Diskus Inhale 1 puff into the lungs 2 (two) times daily. What changed:  when to take this reasons to take this   furosemide 40 MG tablet Commonly known as: LASIX TAKE 1 TABLET(40 MG) BY MOUTH DAILY   Hair Skin and Nails Formula Tabs Take 1 tablet by mouth daily.   lansoprazole 30 MG capsule Commonly known as: PREVACID TAKE 1 CAPSULE(30 MG) BY MOUTH DAILY   montelukast 10 MG tablet Commonly known as: SINGULAIR TAKE 1 TABLET BY MOUTH AT BEDTIME   OneTouch Delica Plus [id number] Misc USE AS DIRECTED UP TO 4 TIMES DAILY AS DIRECTED   OneTouch Verio Flex System w/Device Kit Use Onetouch Verio Flex to check blood sugar once daily.   OneTouch Verio test strip Generic drug: glucose blood USE TO CHECK BLOOD SUGAR ONCE DAILY   phenylephrine 10 MG Tabs tablet Commonly known as: SUDAFED PE Take 10 mg by mouth every 4 (four) hours as needed.   predniSONE 10 MG tablet Commonly known as: DELTASONE Take by mouth.    promethazine 25 MG tablet Commonly known as: PHENERGAN Take 1 tablet (25 mg total) by mouth every 8 (eight) hours as needed for nausea or vomiting.   repaglinide 0.5 MG tablet Commonly known as: PRANDIN TAKE 1 TABLET(0.5 MG) BY MOUTH TWICE DAILY BEFORE A MEAL   sodium chloride inhaler solution Commonly known as: BRONCHO SALINE Take 1 spray by nebulization as needed.   Trulicity 3 CB/4.4HQ Sopn Generic drug: Dulaglutide INJECT 0.5 ML( 3 MG) UNDER THE SKIN ONCE WEEKLY        Allergies:  Allergies  Allergen Reactions   Fentanyl Anxiety    Made her feel very anxious, requests not to get it.   Levofloxacin Anxiety    Weird dreams    Prednisone     Anxiety and paranoia   Amoxicillin Diarrhea    Has patient had a PCN reaction causing immediate rash, facial/tongue/throat swelling, SOB or lightheadedness with hypotension: No Has patient had a PCN reaction causing severe rash involving mucus membranes or skin necrosis: No Has patient had a PCN reaction that required hospitalization: No Has patient had a PCN reaction occurring within the last 10 years: Yes If all of the above answers are "NO", then may proceed with Cephalosporin use.    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   GERD (gastroesophageal reflux disease)    on meds   Hiatal hernia    Hx of adenomatous polyp of colon 07/07/2009   Hyperlipidemia    diet controlled   Hypertension    on meds - LASIX   IBS (irritable bowel  syndrome)    Iron deficiency anemia    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 Carlean Purl, adenimatous polyps, repeat in 7 yr   ESOPHAGOGASTRODUODENOSCOPY  02/2002   ESOPHAGOGASTRODUODENOSCOPY (EGD) WITH PROPOFOL N/A 06/20/2017   Procedure: ESOPHAGOGASTRODUODENOSCOPY (EGD) WITH PROPOFOL;  Surgeon: Gatha Mayer, MD;  Location: [facility];  Service: Endoscopy;  Laterality: N/A;  with Barrx   EYE SURGERY     NASAL SEPTOPLASTY W/ TURBINOPLASTY     POLYPECTOMY  2015   CG-TA   TONSILLECTOMY      Family History  Problem Relation Age of Onset   Stroke Mother    Hypertension Mother    Colon cancer Father 33   Liver cancer Father 75   Colon polyps Father 62   Heart disease Sister        hole in heart   Stroke Maternal Grandmother    Hypertension Maternal Grandmother    Esophageal cancer Maternal Grandfather    Throat cancer Maternal Grandfather    Breast cancer Paternal Grandmother    Brain cancer Paternal Grandmother    Lung cancer Paternal Grandfather    Pancreatic cancer Maternal Aunt    Brain cancer Maternal Uncle    Diabetes Neg Hx    Rectal cancer Neg Hx    Stomach cancer Neg Hx     Social History:  reports that she has never smoked. She has never used smokeless tobacco. She reports current alcohol use. She reports that she does not use drugs.   Review of Systems   Lipid history: She has persistent hypercholesterolemia Highest baseline LDL about 170  Previously had tried Zocor which apparently caused muscle aches, she took Crestor and she thinks generic causes muscle cramps She took Livalo for a couple of days but she thinks  she didn't feel well with this and had some palpitations reportedly  She has been prescribed Zetia which she is now taking regularly LDL is last better at 97 compared to 156   Lab Results  Component Value Date   CHOL 163 07/14/2020   CHOL 236 (H) 08/01/2019   CHOL 219 (H) 12/20/2018   Lab Results  Component Value Date   HDL 41.50 07/14/2020   HDL 59.40 08/01/2019   HDL 56.30 12/20/2018   Lab Results  Component Value Date   LDLCALC 97 07/14/2020   LDLCALC 156 (H) 08/01/2019   LDLCALC 143 (H) 12/20/2018   Lab Results  Component Value Date   TRIG 120.0 07/14/2020   TRIG 104.0 08/01/2019   TRIG 101.0 12/20/2018   Lab Results  Component Value Date   CHOLHDL 4 07/14/2020  CHOLHDL 4 08/01/2019   CHOLHDL 4 12/20/2018   Lab Results  Component Value Date   LDLDIRECT 153.0 02/27/2019           Lab Results  Component Value Date   ALT 87 (H) 08/21/2020    Hypertension: She states she has whitecoat syndrome and her blood pressure readings are generally normal at home Has not compared home monitor to the office reading   BP Readings from Last 3 Encounters:  11/25/20 140/88  10/23/20 126/78  08/29/20 124/88    She takes Lasix  for complaints of having swelling in her abdomen Potassium has improved  Lab Results  Component Value Date   K 3.9 11/18/2020     Most recent eye exam was 10/20  Most recent foot exam: 2/22  Has had normal thyroid levels previously, benign thyroid nodule in 2012 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  Latest known visit with results is:  Lab on 11/18/2020  Component Date Value Ref Range Status   Fructosamine 11/18/2020 379 (A) 0 - 285 umol/L Final   Comment: Published reference interval for apparently healthy subjects between age 110 and 38 is 58 - 285 umol/L and in a  poorly controlled diabetic population is 228 - 563 umol/L with a mean of 396 umol/L.    TSH 11/18/2020 2.76  0.35 - 4.50 uIU/mL Final   Sodium 11/18/2020 136  135 - 145 mEq/L Final   Potassium 11/18/2020 3.9  3.5 - 5.1 mEq/L Final   Chloride 11/18/2020 98  96 - 112 mEq/L Final   CO2 11/18/2020 27  19 - 32 mEq/L Final   Glucose, Bld 11/18/2020 120 (A) 70 - 99 mg/dL Final   BUN 11/18/2020 14  6 - 23 mg/dL Final   Creatinine, Ser 11/18/2020 0.62  0.40 - 1.20 mg/dL Final   GFR 11/18/2020 91.41  >60.00 mL/min Final   Calculated using the CKD-EPI Creatinine Equation (2021)   Calcium 11/18/2020 9.4  8.4 - 10.5 mg/dL Final   Hgb A1c MFr Bld 11/18/2020 7.2 (A) 4.6 - 6.5 % Final   Glycemic Control Guidelines for People with Diabetes:Non Diabetic:  <6%Goal of Therapy: <7%Additional Action Suggested:  >8%     Physical Examination:  BP 140/88 (BP Location: Left Arm, Patient Position: Sitting, Cuff Size: Normal)   Pulse 67   Ht 5' 5" (1.651 m)   Wt 127 lb 12.8 oz (58 kg)   SpO2 97%   BMI 21.27 kg/m      ASSESSMENT:  Diabetes type 2, nonobese  See history of present illness for detailed discussion of current diabetes management, blood sugar patterns and problems identified  A1c has been usually lower than expected, now 7.2 compared to 5.5 Also fructosamine is significantly higher  Since she is taking prednisone her blood sugars are likely higher later in the day but she does not monitor these She has taken 1 mg Prandin instead of 0.5 at times Generally eating 2 meals a day However most of her blood sugars are fasting and these are also somewhat variable, recently averaging about 150  Has only 1 high reading at night after supper and she will still occasionally have sweets or sweet drinks Recently not exercising much but she is planning to join the gym    PLAN:  She does need to start checking blood sugars consistently while on prednisone at least once or twice a day including  after meals and does not need to check in the mornings except weekly Likely needs 2 mg of Prandin if her readings are still high after meals but this will be determined by her blood sugar readings Prescription for 1 mg Prandin given and she can let us know if she needs a higher dose Continue 3 mg weekly Trulicity   There are no Patient Instructions on file for this visit.       Elayne Snare 11/25/2020, 8:55 AM   Note: This office note was prepared with Dragon voice recognition system technology. Any transcriptional errors that result from this process are unintentional.

## 2020-11-25 NOTE — Patient Instructions (Signed)
Take 1-2mg  Prandin at each meal

## 2020-12-24 ENCOUNTER — Other Ambulatory Visit: Payer: Self-pay | Admitting: Endocrinology

## 2021-01-08 ENCOUNTER — Other Ambulatory Visit: Payer: Self-pay | Admitting: Endocrinology

## 2021-01-08 DIAGNOSIS — E1165 Type 2 diabetes mellitus with hyperglycemia: Secondary | ICD-10-CM

## 2021-01-23 ENCOUNTER — Other Ambulatory Visit: Payer: Self-pay | Admitting: Family Medicine

## 2021-01-28 ENCOUNTER — Other Ambulatory Visit: Payer: Self-pay | Admitting: Nurse Practitioner

## 2021-01-28 ENCOUNTER — Other Ambulatory Visit: Payer: 59

## 2021-01-28 ENCOUNTER — Other Ambulatory Visit: Payer: Self-pay

## 2021-01-28 ENCOUNTER — Encounter (INDEPENDENT_AMBULATORY_CARE_PROVIDER_SITE_OTHER): Payer: 59 | Admitting: Endocrinology

## 2021-01-28 DIAGNOSIS — K754 Autoimmune hepatitis: Secondary | ICD-10-CM

## 2021-01-28 DIAGNOSIS — E1165 Type 2 diabetes mellitus with hyperglycemia: Secondary | ICD-10-CM

## 2021-01-28 DIAGNOSIS — K7460 Unspecified cirrhosis of liver: Secondary | ICD-10-CM

## 2021-01-28 LAB — COMPREHENSIVE METABOLIC PANEL
ALT: 29 U/L (ref 0–35)
AST: 26 U/L (ref 0–37)
Albumin: 4.1 g/dL (ref 3.5–5.2)
Alkaline Phosphatase: 150 U/L — ABNORMAL HIGH (ref 39–117)
BUN: 14 mg/dL (ref 6–23)
CO2: 34 mEq/L — ABNORMAL HIGH (ref 19–32)
Calcium: 10 mg/dL (ref 8.4–10.5)
Chloride: 94 mEq/L — ABNORMAL LOW (ref 96–112)
Creatinine, Ser: 0.66 mg/dL (ref 0.40–1.20)
GFR: 89.92 mL/min (ref >60.00)
Glucose, Bld: 205 mg/dL — ABNORMAL HIGH (ref 70–99)
Potassium: 4.3 mEq/L (ref 3.5–5.1)
Sodium: 136 mEq/L (ref 135–145)
Total Bilirubin: 1.7 mg/dL — ABNORMAL HIGH (ref 0.2–1.2)
Total Protein: 7.5 g/dL (ref 6.0–8.3)

## 2021-01-28 NOTE — Progress Notes (Signed)
This encounter was created in error - please disregard.

## 2021-01-29 LAB — FRUCTOSAMINE: Fructosamine: 381 umol/L — ABNORMAL HIGH (ref 0–285)

## 2021-02-06 ENCOUNTER — Encounter: Payer: 59 | Admitting: Endocrinology

## 2021-02-06 NOTE — Progress Notes (Signed)
This encounter was created in error - please disregard.

## 2021-02-09 ENCOUNTER — Telehealth: Payer: Self-pay | Admitting: Endocrinology

## 2021-02-09 NOTE — Telephone Encounter (Signed)
MEDICATION: repaglinide (PRANDIN) 1 MG tablet  PHARMACY:    [facility] [place], [place] AT [facility] [address] Phone:  [phone]  Fax:  [phone]     HAS THE PATIENT CONTACTED THEIR PHARMACY?  Yes-Patient states PHARM sent request for the above RX  IS THIS A 90 DAY SUPPLY : No  IS PATIENT OUT OF MEDICATION: No  IF NOT; HOW MUCH IS LEFT: Approx. 4 tablets  LAST APPOINTMENT DATE: '@8'$ /17/2022  NEXT APPOINTMENT DATE:'@9'$ /06/2020  DO WE HAVE YOUR PERMISSION TO LEAVE A DETAILED MESSAGE?:Yes  OTHER COMMENTS:    **Let patient know to contact pharmacy at the end of the day to make sure medication is ready. **  ** Please notify patient to allow 48-72 hours to process**  **Encourage patient to contact the pharmacy for refills or they can request refills through [facility]**

## 2021-02-10 MED ORDER — REPAGLINIDE 1 MG PO TABS: 1.0000 mg | ORAL_TABLET | Freq: Two times a day (BID) | ORAL | 0 refills | Status: DC

## 2021-02-10 NOTE — Addendum Note (Signed)
Addended by: Jefferson Fuel on: 02/10/2021 09:58 AM   Modules accepted: Orders

## 2021-02-12 ENCOUNTER — Ambulatory Visit: Payer: 59 | Admitting: Endocrinology

## 2021-02-27 ENCOUNTER — Other Ambulatory Visit: Payer: 59

## 2021-03-13 ENCOUNTER — Other Ambulatory Visit: Payer: 59

## 2021-03-13 ENCOUNTER — Other Ambulatory Visit: Payer: Self-pay | Admitting: Endocrinology

## 2021-03-13 ENCOUNTER — Other Ambulatory Visit (INDEPENDENT_AMBULATORY_CARE_PROVIDER_SITE_OTHER): Payer: 59

## 2021-03-13 ENCOUNTER — Other Ambulatory Visit: Payer: Self-pay | Admitting: Internal Medicine

## 2021-03-13 DIAGNOSIS — E7800 Pure hypercholesterolemia, unspecified: Secondary | ICD-10-CM

## 2021-03-13 DIAGNOSIS — E1165 Type 2 diabetes mellitus with hyperglycemia: Secondary | ICD-10-CM | POA: Diagnosis not present

## 2021-03-13 DIAGNOSIS — R52 Pain, unspecified: Secondary | ICD-10-CM

## 2021-03-13 LAB — COMPREHENSIVE METABOLIC PANEL
ALT: 23 U/L (ref 0–35)
AST: 18 U/L (ref 0–37)
Albumin: 4.1 g/dL (ref 3.5–5.2)
Alkaline Phosphatase: 78 U/L (ref 39–117)
BUN: 17 mg/dL (ref 6–23)
CO2: 29 mEq/L (ref 19–32)
Calcium: 9.4 mg/dL (ref 8.4–10.5)
Chloride: 96 mEq/L (ref 96–112)
Creatinine, Ser: 0.52 mg/dL (ref 0.40–1.20)
GFR: 95.15 mL/min (ref >60.00)
Glucose, Bld: 176 mg/dL — ABNORMAL HIGH (ref 70–99)
Potassium: 3.2 mEq/L — ABNORMAL LOW (ref 3.5–5.1)
Sodium: 136 mEq/L (ref 135–145)
Total Bilirubin: 1.1 mg/dL (ref 0.2–1.2)
Total Protein: 7.2 g/dL (ref 6.0–8.3)

## 2021-03-13 LAB — LIPID PANEL
Cholesterol: 235 mg/dL — ABNORMAL HIGH (ref 0–200)
HDL: 61.8 mg/dL (ref >39.00)
LDL Cholesterol: 144 mg/dL — ABNORMAL HIGH (ref 0–99)
NonHDL: 173.67
Total CHOL/HDL Ratio: 4
Triglycerides: 149 mg/dL (ref 0.0–149.0)
VLDL: 29.8 mg/dL (ref 0.0–40.0)

## 2021-03-13 LAB — HEMOGLOBIN A1C: Hgb A1c MFr Bld: 7.3 % — ABNORMAL HIGH (ref 4.6–6.5)

## 2021-03-14 LAB — FRUCTOSAMINE: Fructosamine: 386 umol/L — ABNORMAL HIGH (ref 0–285)

## 2021-03-21 ENCOUNTER — Other Ambulatory Visit: Payer: Self-pay | Admitting: Endocrinology

## 2021-03-24 ENCOUNTER — Other Ambulatory Visit: Payer: Self-pay | Admitting: Endocrinology

## 2021-03-24 ENCOUNTER — Other Ambulatory Visit: Payer: Self-pay

## 2021-03-24 ENCOUNTER — Ambulatory Visit: Payer: 59 | Admitting: Endocrinology

## 2021-03-24 VITALS — BP 140/86 | HR 84 | Ht 66.0 in | Wt 136.8 lb

## 2021-03-24 DIAGNOSIS — E876 Hypokalemia: Secondary | ICD-10-CM

## 2021-03-24 DIAGNOSIS — E7800 Pure hypercholesterolemia, unspecified: Secondary | ICD-10-CM

## 2021-03-24 DIAGNOSIS — E1165 Type 2 diabetes mellitus with hyperglycemia: Secondary | ICD-10-CM | POA: Diagnosis not present

## 2021-03-24 MED ORDER — FREESTYLE LIBRE 2 SENSOR MISC: 2.0000 | 3 refills | Status: DC

## 2021-03-24 MED ORDER — REPAGLINIDE 1 MG PO TABS: ORAL_TABLET | ORAL | 2 refills | Status: DC

## 2021-03-24 NOTE — Patient Instructions (Signed)
Take 2 Prandin at each meal and 1 at bedtime unless eating before 9 pm  Check blood sugars on waking up 3 days a week  Also check blood sugars about 2 hours after meals and do this after different meals by rotation  Recommended blood sugar levels on waking up are 90-130 and about 2 hours after meal is 130-180  Please bring your blood sugar monitor to each visit, thank you  Walk daily

## 2021-03-24 NOTE — Progress Notes (Signed)
[name], female   DOB: [birth year], 70 y.o.   MRN: [id number]          Reason for Appointment: Follow-up for Type 2 Diabetes    History of Present Illness:          Date of diagnosis of type 2 diabetes mellitus: ?  2013        Background history:   She had a blood sugar over 200 in 2013 but was not told to have diabetes until 04/2015 A1c was 7.3 in 3/16 In 04/2015 blood sugar was 478 says her blood sugar was 600 when she was diagnosed and was not having any significant symptoms Initially treated with Metaglip but this apparently caused abdominal discomfort and she was switched to metformin In 2017 she had been tried on Bydureon for better control but she stopped this after a few weeks because of large persisted nodules in her skin She thinks her blood sugars were better with this and A1c was down to 6.8  Recent history:   Non-insulin hypoglycemic drugs the patient is taking are: Trulicity 3 mg weekly, Prandin 1-2 mg twice daily  Her A1c is usually falsely low and is now 7.3, previously as low as 5.5   Fructosamine still about the same at 386   Current management, blood sugar patterns and problems identified:   She has had continued treatment with steroids and now taking 10 mg prednisone in the morning  As before she does not like to check her blood sugars with fingersticks and has only intermittent monitoring  Also most of her blood sugars are around midday and she will sometimes do blood sugars before and sometimes after eating  However the last few readings she has are about 190 and lab fasting glucose was 176 Lowest reading was 103 on 9/19 She thinks she is eating late in the evenings sometimes at 10 PM but most of her monitoring is done more than 12 hours later She has tried taking 2 mg of Prandin but does not do this consistently No readings after supper Also no hypoglycemic symptoms No side effects from Trulicity Her weight has gone up with  prednisone She does walk only intermittently        Side effects from medications have been: Metaglip caused abdominal pain, Tradjenta caused swelling of the legs, Bydureon caused skin nodules, Farxiga caused diarrhea, glipizide causes dizziness and weakness, Metformin caused GI side effects, Invokana causes yeast infections   Glucose monitoring:  done less than 1 times a day         Glucometer: One Verio     Blood Glucose readings by time of day by monitor download:  AVERAGE 152 103-191  Previously:   PRE-MEAL Fasting Lunch Dinner Bedtime Overall  Glucose range: 92-179    92-281  Mean/median:     169   POST-MEAL PC Breakfast PC Lunch PC Dinner  Glucose range:   162, 281  Mean/median:       PREVIOUS blood sugar range 111-1 246 with only 1 high reading AVERAGE 154 with most readings between 10 AM-2 PM    Self-care:  Typical meal intake: Breakfast is Oatmeal, usually eating low-fat dinner and her snacks will be fruit, peanut butter crackers                Dietician visit, most recent: None               Weight history:  Wt Readings from Last 3 Encounters:  03/24/21 136 lb 12.8 oz (62.1 kg)  11/25/20 127 lb 12.8 oz (58 kg)  10/23/20 129 lb 9.6 oz (58.8 kg)    Glycemic control:   Lab Results  Component Value Date   HGBA1C 7.3 (H) 03/13/2021   HGBA1C 7.2 (H) 11/18/2020   HGBA1C 5.5 07/14/2020   Lab Results  Component Value Date   MICROALBUR 2.2 (H) 07/14/2020   LDLCALC 144 (H) 03/13/2021   CREATININE 0.52 03/13/2021   Lab Results  Component Value Date   MICRALBCREAT 10.2 07/14/2020    Lab Results  Component Value Date   FRUCTOSAMINE 386 (H) 03/13/2021   FRUCTOSAMINE 381 (H) 01/28/2021   FRUCTOSAMINE 379 (H) 11/18/2020    No visits with results within 1 Week(s) from this visit.  Latest known visit with results is:  Appointment on 03/13/2021  Component Date Value Ref Range Status   Cholesterol 03/13/2021 235 (A) 0 - 200 mg/dL Final   ATP III  Classification       Desirable:  < 200 mg/dL               Borderline High:  200 - 239 mg/dL          High:  > = 240 mg/dL   Triglycerides 03/13/2021 149.0  0.0 - 149.0 mg/dL Final   Normal:  <150 mg/dLBorderline High:  150 - 199 mg/dL   HDL 03/13/2021 61.80  >39.00 mg/dL Final   VLDL 03/13/2021 29.8  0.0 - 40.0 mg/dL Final   LDL Cholesterol 03/13/2021 144 (A) 0 - 99 mg/dL Final   Total CHOL/HDL Ratio 03/13/2021 4   Final                  Men          Women1/2 Average Risk     3.4          3.3Average Risk          5.0          4.42X Average Risk          9.6          7.13X Average Risk          15.0          11.0                       NonHDL 03/13/2021 173.67   Final   NOTE:  Non-HDL goal should be 30 mg/dL higher than patient's LDL goal (i.e. LDL goal of < 70 mg/dL, would have non-HDL goal of < 100 mg/dL)   Fructosamine 03/13/2021 386 (A) 0 - 285 umol/L Final   Comment: Published reference interval for apparently healthy subjects between age 54 and 60 is 58 - 285 umol/L and in a poorly controlled diabetic population is 228 - 563 umol/L with a mean of 396 umol/L.    Sodium 03/13/2021 136  135 - 145 mEq/L Final   Potassium 03/13/2021 3.2 (A) 3.5 - 5.1 mEq/L Final   Chloride 03/13/2021 96  96 - 112 mEq/L Final   CO2 03/13/2021 29  19 - 32 mEq/L Final   Glucose, Bld 03/13/2021 176 (A) 70 - 99 mg/dL Final   BUN 03/13/2021 17  6 - 23 mg/dL Final   Creatinine, Ser 03/13/2021 0.52  0.40 - 1.20 mg/dL Final   Total Bilirubin 03/13/2021 1.1  0.2 - 1.2 mg/dL Final   Alkaline Phosphatase 03/13/2021 78  39 - 117 U/L Final  AST 03/13/2021 18  0 - 37 U/L Final   ALT 03/13/2021 23  0 - 35 U/L Final   Total Protein 03/13/2021 7.2  6.0 - 8.3 g/dL Final   Albumin 03/13/2021 4.1  3.5 - 5.2 g/dL Final   GFR 03/13/2021 95.15  >60.00 mL/min Final   Calculated using the CKD-EPI Creatinine Equation (2021)   Calcium 03/13/2021 9.4  8.4 - 10.5 mg/dL Final   Hgb A1c MFr Bld 03/13/2021 7.3 (A) 4.6 - 6.5 % Final    Glycemic Control Guidelines for People with Diabetes:Non Diabetic:  <6%Goal of Therapy: <7%Additional Action Suggested:  >8%      Allergies as of 03/24/2021       Reactions   Fentanyl Anxiety   Made her feel very anxious, requests not to get it.   Levofloxacin Anxiety   Weird dreams    Prednisone    Anxiety and paranoia   Amoxicillin Diarrhea   Has patient had a PCN reaction causing immediate rash, facial/tongue/throat swelling, SOB or lightheadedness with hypotension: No Has patient had a PCN reaction causing severe rash involving mucus membranes or skin necrosis: No Has patient had a PCN reaction that required hospitalization: No Has patient had a PCN reaction occurring within the last 10 years: Yes If all of the above answers are "NO", then may proceed with Cephalosporin use.   Augmentin [amoxicillin-pot Clavulanate] Other (See Comments)   diarrhea   Farxiga [dapagliflozin] Diarrhea   Gi pain   Statins    Myalgia         Medication List        Accurate as of March 24, 2021 10:32 AM. If you have any questions, ask your nurse or doctor.          ALPRAZolam 0.5 MG tablet Commonly known as: XANAX TAKE 1 TABLET(0.5 MG) BY MOUTH AT BEDTIME AS NEEDED FOR SLEEP   AMBULATORY NON FORMULARY MEDICATION ACTIVE LIVER Artichoke leaf extract, Milk thistle, Turmeric Take 1 capsule by mouth once daily   aspirin 325 MG tablet Take 650-975 mg by mouth daily as needed for moderate pain or headache.   cyclobenzaprine 10 MG tablet Commonly known as: FLEXERIL TAKE 1 TABLET BY MOUTH EVERY 8 HOURS AS NEEDED FOR MUSCLE SPASMS WITH ONE BEING AT BEDTIME IF NEEDED   diphenhydrAMINE 25 mg capsule Commonly known as: BENADRYL Take 25-50 mg by mouth daily as needed for allergies.   diphenhydrAMINE-zinc acetate cream Commonly known as: BENADRYL Apply 1 application topically daily as needed for itching.   Effexor XR 75 MG 24 hr capsule Generic drug: venlafaxine XR TAKE 1 CAPSULE  BY MOUTH TWICE DAILY WITH A MEAL   ezetimibe 10 MG tablet Commonly known as: ZETIA TAKE 1 TABLET(10 MG) BY MOUTH DAILY   ferrous gluconate 324 MG tablet Commonly known as: FERGON Take 324 mg by mouth daily.   Fluticasone-Salmeterol 100-50 MCG/DOSE Aepb Commonly known as: Advair Diskus Inhale 1 puff into the lungs 2 (two) times daily. What changed:  when to take this reasons to take this   furosemide 40 MG tablet Commonly known as: LASIX TAKE 1 TABLET(40 MG) BY MOUTH DAILY   Hair Skin and Nails Formula Tabs Take 1 tablet by mouth daily.   lansoprazole 30 MG capsule Commonly known as: PREVACID TAKE 1 CAPSULE(30 MG) BY MOUTH DAILY   montelukast 10 MG tablet Commonly known as: SINGULAIR TAKE 1 TABLET BY MOUTH AT BEDTIME   OneTouch Delica Plus [id number] Misc USE AS DIRECTED UP TO 4 TIMES  DAILY AS DIRECTED   OneTouch Verio Flex System w/Device Kit Use Onetouch Verio Flex to check blood sugar once daily.   OneTouch Verio test strip Generic drug: glucose blood USE TO CHECK BLOOD SUGAR ONCE DAILY   phenylephrine 10 MG Tabs tablet Commonly known as: SUDAFED PE Take 10 mg by mouth every 4 (four) hours as needed.   promethazine 25 MG tablet Commonly known as: PHENERGAN Take 1 tablet (25 mg total) by mouth every 8 (eight) hours as needed for nausea or vomiting.   repaglinide 1 MG tablet Commonly known as: PRANDIN TAKE 1 TABLET(1 MG) BY MOUTH TWICE DAILY BEFORE A MEAL   sodium chloride inhaler solution Commonly known as: BRONCHO SALINE Take 1 spray by nebulization as needed.   Trulicity 3 PY/1.9JK Sopn Generic drug: Dulaglutide ADMINISTER 0.5 ML(3 MG) UNDER THE SKIN 1 TIME WEEKLY        Allergies:  Allergies  Allergen Reactions   Fentanyl Anxiety    Made her feel very anxious, requests not to get it.   Levofloxacin Anxiety    Weird dreams    Prednisone     Anxiety and paranoia   Amoxicillin Diarrhea    Has patient had a PCN reaction causing immediate  rash, facial/tongue/throat swelling, SOB or lightheadedness with hypotension: No Has patient had a PCN reaction causing severe rash involving mucus membranes or skin necrosis: No Has patient had a PCN reaction that required hospitalization: No Has patient had a PCN reaction occurring within the last 10 years: Yes If all of the above answers are "NO", then may proceed with Cephalosporin use.    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   GERD (gastroesophageal reflux disease)    on meds   Hiatal hernia    Hx of adenomatous polyp of colon 07/07/2009   Hyperlipidemia    diet controlled   Hypertension    on meds - LASIX   IBS (irritable bowel syndrome)    Iron deficiency anemia    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 Carlean Purl, adenimatous polyps, repeat in 7 yr   ESOPHAGOGASTRODUODENOSCOPY  02/2002   ESOPHAGOGASTRODUODENOSCOPY (EGD) WITH PROPOFOL N/A 06/20/2017   Procedure: ESOPHAGOGASTRODUODENOSCOPY (EGD) WITH PROPOFOL;  Surgeon: Gatha Mayer, MD;  Location: [facility];  Service: Endoscopy;  Laterality: N/A;  with Barrx   EYE SURGERY     NASAL  SEPTOPLASTY W/ TURBINOPLASTY     POLYPECTOMY  2015   CG-TA   TONSILLECTOMY  Family History  Problem Relation Age of Onset   Stroke Mother    Hypertension Mother    Colon cancer Father 53   Liver cancer Father 85   Colon polyps Father 67   Heart disease Sister        hole in heart   Stroke Maternal Grandmother    Hypertension Maternal Grandmother    Esophageal cancer Maternal Grandfather    Throat cancer Maternal Grandfather    Breast cancer Paternal Grandmother    Brain cancer Paternal Grandmother    Lung cancer Paternal Grandfather    Pancreatic cancer Maternal Aunt    Brain cancer Maternal Uncle    Diabetes Neg Hx    Rectal cancer Neg Hx    Stomach cancer Neg Hx     Social History:  reports that she has never smoked. She has never used smokeless tobacco. She reports current alcohol use. She reports that she does not use drugs.   Review of Systems   Lipid history: She has persistent hypercholesterolemia Highest baseline LDL about 170  Previously had tried Zocor which apparently caused muscle aches, she took Crestor and she thinks generic causes muscle cramps She took Livalo for a couple of days but she thinks she didn't feel well with this and had some palpitations reportedly  She has been prescribed Zetia which she is only taking regularly the last 2 weeks when her labs showed high cholesterol Not clear why she stopped her medication previously LDL has been as low as 97 when she was taking this   Lab Results  Component Value Date   CHOL 235 (H) 03/13/2021   CHOL 163 07/14/2020   CHOL 236 (H) 08/01/2019   Lab Results  Component Value Date   HDL 61.80 03/13/2021   HDL 41.50 07/14/2020   HDL 59.40 08/01/2019   Lab Results  Component Value Date   LDLCALC 144 (H) 03/13/2021   LDLCALC 97 07/14/2020   LDLCALC 156 (H) 08/01/2019   Lab Results  Component Value Date   TRIG 149.0 03/13/2021   TRIG 120.0 07/14/2020   TRIG 104.0 08/01/2019   Lab Results   Component Value Date   CHOLHDL 4 03/13/2021   CHOLHDL 4 07/14/2020   CHOLHDL 4 08/01/2019   Lab Results  Component Value Date   LDLDIRECT 153.0 02/27/2019           Lab Results  Component Value Date   ALT 23 03/13/2021    Hypertension: She states she has whitecoat syndrome and her blood pressure readings are generally normal at home Has not compared home monitor to the office reading   BP Readings from Last 3 Encounters:  03/24/21 140/86  11/25/20 140/88  10/23/20 126/78    She takes Lasix  for complaints of having swelling in her abdomen Potassium has previously been normal but now it is low, she is also on prednisone She refuses to take potassium supplements  Lab Results  Component Value Date   K 3.2 (L) 03/13/2021     Most recent eye exam was 10/20  Most recent foot exam: 2/22  Has had normal thyroid levels previously, benign thyroid nodule in 2012 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  Latest known visit with results is:  Appointment on 03/13/2021  Component Date Value Ref Range Status  Cholesterol 03/13/2021 235 (A) 0 - 200 mg/dL Final   ATP III Classification       Desirable:  < 200 mg/dL               Borderline High:  200 - 239 mg/dL          High:  > = 240 mg/dL   Triglycerides 03/13/2021 149.0  0.0 - 149.0 mg/dL Final   Normal:  <150 mg/dLBorderline High:  150 - 199 mg/dL   HDL 03/13/2021 61.80  >39.00 mg/dL Final   VLDL 03/13/2021 29.8  0.0 - 40.0 mg/dL Final   LDL Cholesterol 03/13/2021 144 (A) 0 - 99 mg/dL Final   Total CHOL/HDL Ratio 03/13/2021 4   Final                  Men          Women1/2 Average Risk     3.4          3.3Average Risk          5.0          4.42X Average Risk          9.6          7.13X Average Risk          15.0          11.0                       NonHDL 03/13/2021  173.67   Final   NOTE:  Non-HDL goal should be 30 mg/dL higher than patient's LDL goal (i.e. LDL goal of < 70 mg/dL, would have non-HDL goal of < 100 mg/dL)   Fructosamine 03/13/2021 386 (A) 0 - 285 umol/L Final   Comment: Published reference interval for apparently healthy subjects between age 58 and 47 is 36 - 285 umol/L and in a poorly controlled diabetic population is 228 - 563 umol/L with a mean of 396 umol/L.    Sodium 03/13/2021 136  135 - 145 mEq/L Final   Potassium 03/13/2021 3.2 (A) 3.5 - 5.1 mEq/L Final   Chloride 03/13/2021 96  96 - 112 mEq/L Final   CO2 03/13/2021 29  19 - 32 mEq/L Final   Glucose, Bld 03/13/2021 176 (A) 70 - 99 mg/dL Final   BUN 03/13/2021 17  6 - 23 mg/dL Final   Creatinine, Ser 03/13/2021 0.52  0.40 - 1.20 mg/dL Final   Total Bilirubin 03/13/2021 1.1  0.2 - 1.2 mg/dL Final   Alkaline Phosphatase 03/13/2021 78  39 - 117 U/L Final   AST 03/13/2021 18  0 - 37 U/L Final   ALT 03/13/2021 23  0 - 35 U/L Final   Total Protein 03/13/2021 7.2  6.0 - 8.3 g/dL Final   Albumin 03/13/2021 4.1  3.5 - 5.2 g/dL Final   GFR 03/13/2021 95.15  >60.00 mL/min Final   Calculated using the CKD-EPI Creatinine Equation (2021)   Calcium 03/13/2021 9.4  8.4 - 10.5 mg/dL Final   Hgb A1c MFr Bld 03/13/2021 7.3 (A) 4.6 - 6.5 % Final   Glycemic Control Guidelines for People with Diabetes:Non Diabetic:  <6%Goal of Therapy: <7%Additional Action Suggested:  >8%     Physical Examination:  BP 140/86   Pulse 84   Ht '5\' 6"'  (1.676 m)   Wt 136 lb 12.8 oz (62.1 kg)   SpO2 99%   BMI 22.08 kg/m   No ankle edema  ASSESSMENT:  Diabetes type 2, nonobese  See history of present illness for detailed discussion of current diabetes management, blood sugar patterns and problems identified  A1c has stayed relatively high at 7.3, in the past this has been as low as 5.5  Also fructosamine is significantly high indicating overall continued hyperglycemia  Since she is taking prednisone  her blood sugars have been difficult to control Even with taking 1-2 mg Prandin she is likely not getting good control including fasting readings She refuses to consider basal insulin Also does not like to check her blood sugars, mostly checking them around midday  As before she does not always restrict carbohydrates and sweets Likely gaining weight from increased appetite from prednisone also which she is going to be continuing long-term  She still has not exercised as recommended  HYPOKALEMIA she refuses to consider potassium supplements and wants to just eat a banana daily which she does most of the time anyway  Hypercholesterolemia: Not controlled from her not taking Zetia as discussed above   PLAN:     She does need to start checking blood sugars regularly alternating morning and after meals If covered we will also have her try to use the freestyle [employer] regular walking She will need to take 2 mg Prandin with each meal and 1 mg at bedtime She can skip the bedtime dose if eating supper after 9 PM To call having consistently high readings Check fructosamine on each visit which is correlating better with her sugars and A1c  Continue 3 mg weekly Trulicity  Results of potassium forwarded to PCP for further management  There are no Patient Instructions on file for this visit.       Elayne Snare 03/24/2021, 10:32 AM   Note: This office note was prepared with Dragon voice recognition system technology. Any transcriptional errors that result from this process are unintentional.

## 2021-04-01 ENCOUNTER — Ambulatory Visit: Payer: 59 | Admitting: Family Medicine

## 2021-04-01 ENCOUNTER — Encounter: Payer: Self-pay | Admitting: Family Medicine

## 2021-04-01 ENCOUNTER — Other Ambulatory Visit: Payer: Self-pay

## 2021-04-01 VITALS — BP 136/82 | HR 95 | Temp 99.5°F | Wt 131.1 lb

## 2021-04-01 DIAGNOSIS — Z23 Encounter for immunization: Secondary | ICD-10-CM | POA: Diagnosis not present

## 2021-04-01 DIAGNOSIS — S80811A Abrasion, right lower leg, initial encounter: Secondary | ICD-10-CM | POA: Diagnosis not present

## 2021-04-01 DIAGNOSIS — L03115 Cellulitis of right lower limb: Secondary | ICD-10-CM | POA: Diagnosis not present

## 2021-04-01 MED ORDER — CEPHALEXIN 500 MG PO CAPS: 500.0000 mg | ORAL_CAPSULE | Freq: Three times a day (TID) | ORAL | 0 refills | Status: DC

## 2021-04-01 MED ORDER — CEFTRIAXONE SODIUM 1 G IJ SOLR
1.0000 g | Freq: Once | INTRAMUSCULAR | Status: AC
  Administered 2021-04-01: 1 g via INTRAMUSCULAR

## 2021-04-01 NOTE — Addendum Note (Signed)
Addended by: Wyvonne Lenz on: 04/01/2021 12:08 PM   Modules accepted: Orders

## 2021-04-01 NOTE — Progress Notes (Signed)
   Subjective:    [name], female    DOB: 11/19/1950, 70 y.o.   MRN: [id number]  HPI Here for an injury to the right lower leg the occurred on 03-15-21. That day as she was walking aorund a bed, she truck her shin against a wooden footboard. She was able to stop the bleeding and she cleaned it with peroxide. Since then she has continued to clean it with peroxide daily and has been applying Neosporin. The area around it has become red and is somewhat painful.    Review of Systems  Constitutional:  Positive for fever.  Respiratory: Negative.    Cardiovascular: Negative.   Skin:  Positive for wound.      Objective:   Physical Exam Constitutional:      General: She is not in acute distress.    Appearance: Normal appearance.  Cardiovascular:     Rate and Rhythm: Normal rate and regular rhythm.     Pulses: Normal pulses.     Heart sounds: Normal heart sounds.  Pulmonary:     Effort: Pulmonary effort is normal.     Breath sounds: Normal breath sounds.  Skin:    Comments: The anterior right lower leg has a large abrasion which is partially covered by a skin flap. The wound has epithelialized. There is a large zone of erythema around it which is warm and tender. No fluctuance.   Neurological:     Mental Status: She is alert.          Assessment & Plan:  She had a abrasion to the leg, and now she has developed a cellulitis at the site. She is given a shot of Rocephin today, and we will follow this with 10 days of keflex. I advised her to stop using peroxide and Neosporin, and to dress it daily with aloe gel. We will recheck her here early next week.  Alysia Penna, MD

## 2021-04-02 ENCOUNTER — Ambulatory Visit (HOSPITAL_COMMUNITY)
Admission: RE | Admit: 2021-04-02 | Discharge: 2021-04-02 | Disposition: A | Discharge status: Home / Self Care | Payer: 59 | Source: Ambulatory Visit | Attending: Nurse Practitioner | Admitting: Nurse Practitioner

## 2021-04-02 DIAGNOSIS — K7460 Unspecified cirrhosis of liver: Secondary | ICD-10-CM | POA: Insufficient documentation

## 2021-04-02 DIAGNOSIS — K754 Autoimmune hepatitis: Secondary | ICD-10-CM | POA: Insufficient documentation

## 2021-04-07 ENCOUNTER — Ambulatory Visit: Payer: 59 | Admitting: Family Medicine

## 2021-04-07 ENCOUNTER — Encounter: Payer: Self-pay | Admitting: Family Medicine

## 2021-04-07 ENCOUNTER — Other Ambulatory Visit: Payer: Self-pay

## 2021-04-07 VITALS — BP 136/88 | HR 96 | Temp 98.2°F | Wt 130.2 lb

## 2021-04-07 DIAGNOSIS — L03115 Cellulitis of right lower limb: Secondary | ICD-10-CM | POA: Diagnosis not present

## 2021-04-07 MED ORDER — CEFTRIAXONE SODIUM 1 G IJ SOLR
1.0000 g | Freq: Once | INTRAMUSCULAR | Status: AC
  Administered 2021-04-07: 1 g via INTRAMUSCULAR

## 2021-04-07 NOTE — Addendum Note (Signed)
Addended by: Wyvonne Lenz on: 04/07/2021 11:00 AM   Modules accepted: Orders

## 2021-04-07 NOTE — Progress Notes (Signed)
   Subjective:    [name], female    DOB: 1951/03/09, 70 y.o.   MRN: [id number]  HPI Here to follow up on cellulitis from an abrasion to the right leg that occurred on 03-15-21. We saw her on 04-01-21 and started her on Keflex. She has been taking this since then but complains that it causes her a lot of stomach cramps and nausea. She says she does not tolerate most oral antibiotics. She also notes that the infection caused her glucoses to shoot up, and one day a random glucose was 403. At that point she started taking an extra Prandin every day, the glucoses came back down. The past two mornings her fasting glucose was 89 and 101. Her leg is less painful than before but is still tender. No fevers.    Review of Systems  Constitutional: Negative.   Respiratory: Negative.    Cardiovascular: Negative.   Skin:  Positive for wound.      Objective:   Physical Exam Constitutional:      Appearance: Normal appearance.  Cardiovascular:     Rate and Rhythm: Normal rate and regular rhythm.     Pulses: Normal pulses.     Heart sounds: Normal heart sounds.  Pulmonary:     Effort: Pulmonary effort is normal.     Breath sounds: Normal breath sounds.  Skin:    Comments: The anterior right lower leg has an abrasion that has closed up about 50% from her last visit here. There is good granulation tissue at all the edges. The zone of erythema around the wound is growing smaller.   Neurological:     Mental Status: She is alert.          Assessment & Plan:  Partially treated cellulitis in the right lower leg resulting from an abrasion. At her request, we will stop oral antibiotics and start daily shot of Rocephin. Beginning today and going through Friday, this will be 4 shots of Rocephin. We will recheck her next week.  Alysia Penna, MD

## 2021-04-08 ENCOUNTER — Ambulatory Visit (INDEPENDENT_AMBULATORY_CARE_PROVIDER_SITE_OTHER): Payer: 59

## 2021-04-08 DIAGNOSIS — L03115 Cellulitis of right lower limb: Secondary | ICD-10-CM | POA: Diagnosis not present

## 2021-04-08 MED ORDER — CEFTRIAXONE SODIUM 1 G IJ SOLR
1.0000 g | Freq: Every day | INTRAMUSCULAR | Status: AC
  Administered 2021-04-08 – 2021-04-09 (×2): 1 g via INTRAMUSCULAR

## 2021-04-08 NOTE — Progress Notes (Signed)
Pt here for daily Rocephin injection #2 of 4 per Dr Sarajane Jews.  Rocephin 1g given IM right ventrogluteal and pt tolerated injection well.  Next Recephin injection scheduled for 04/09/21.

## 2021-04-09 ENCOUNTER — Other Ambulatory Visit: Payer: Self-pay

## 2021-04-09 ENCOUNTER — Ambulatory Visit (INDEPENDENT_AMBULATORY_CARE_PROVIDER_SITE_OTHER): Payer: 59

## 2021-04-09 DIAGNOSIS — L03115 Cellulitis of right lower limb: Secondary | ICD-10-CM | POA: Diagnosis not present

## 2021-04-09 NOTE — Progress Notes (Signed)
Per orders of Dr. Sarajane Jews, injection of Ceftriaxone 1g given by Helaine Yackel L Randell Teare. Patient tolerated injection well.

## 2021-04-10 ENCOUNTER — Ambulatory Visit (INDEPENDENT_AMBULATORY_CARE_PROVIDER_SITE_OTHER): Payer: 59

## 2021-04-10 DIAGNOSIS — L03115 Cellulitis of right lower limb: Secondary | ICD-10-CM

## 2021-04-10 MED ORDER — CEFTRIAXONE SODIUM 1 G IJ SOLR
1.0000 g | Freq: Once | INTRAMUSCULAR | Status: AC
  Administered 2021-04-10: 1 g via INTRAMUSCULAR

## 2021-04-10 NOTE — Progress Notes (Signed)
Per orders of Dr. Sarajane Jews, injection of Rocephin given by Rodrigo Ran. Patient tolerated injection well.

## 2021-04-13 ENCOUNTER — Ambulatory Visit: Payer: 59 | Admitting: Family Medicine

## 2021-04-20 ENCOUNTER — Ambulatory Visit: Payer: 59 | Admitting: Family Medicine

## 2021-04-21 ENCOUNTER — Encounter: Payer: Self-pay | Admitting: Family Medicine

## 2021-04-21 ENCOUNTER — Ambulatory Visit: Payer: 59 | Admitting: Family Medicine

## 2021-04-21 ENCOUNTER — Other Ambulatory Visit: Payer: Self-pay

## 2021-04-21 VITALS — BP 130/86 | HR 91 | Temp 98.7°F | Wt 130.0 lb

## 2021-04-21 DIAGNOSIS — L03115 Cellulitis of right lower limb: Secondary | ICD-10-CM

## 2021-04-21 DIAGNOSIS — Z760 Encounter for issue of repeat prescription: Secondary | ICD-10-CM | POA: Diagnosis not present

## 2021-04-21 DIAGNOSIS — J40 Bronchitis, not specified as acute or chronic: Secondary | ICD-10-CM | POA: Diagnosis not present

## 2021-04-21 DIAGNOSIS — K219 Gastro-esophageal reflux disease without esophagitis: Secondary | ICD-10-CM

## 2021-04-21 MED ORDER — ALPRAZOLAM 0.5 MG PO TABS: ORAL_TABLET | ORAL | 5 refills | Status: DC

## 2021-04-21 MED ORDER — CEFTRIAXONE SODIUM 1 G IJ SOLR
1.0000 g | Freq: Once | INTRAMUSCULAR | Status: AC
  Administered 2021-04-21: 1 g via INTRAMUSCULAR

## 2021-04-21 MED ORDER — MONTELUKAST SODIUM 10 MG PO TABS: 10.0000 mg | ORAL_TABLET | Freq: Every day | ORAL | 3 refills | Status: DC

## 2021-04-21 MED ORDER — LANSOPRAZOLE 30 MG PO CPDR: DELAYED_RELEASE_CAPSULE | ORAL | 3 refills | Status: DC

## 2021-04-21 NOTE — Addendum Note (Signed)
Addended by: Wyvonne Lenz on: 04/21/2021 11:34 AM   Modules accepted: Orders

## 2021-04-21 NOTE — Progress Notes (Signed)
   Subjective:    [name], female    DOB: 1950/11/14, 70 y.o.   MRN: [id number]  HPI Here to follow up on cellulitis on the right lower leg after an abrasion on 03-15-21. She has received 4 shots of Rocephin, the last one being on 04-10-21. These have made a huge difference, and the wound is healing nicely. She has very little pain at this point.    Review of Systems  Constitutional: Negative.   Respiratory: Negative.    Cardiovascular: Negative.   Skin:  Positive for wound.      Objective:   Physical Exam Constitutional:      Appearance: Normal appearance.  Cardiovascular:     Rate and Rhythm: Normal rate and regular rhythm.     Pulses: Normal pulses.     Heart sounds: Normal heart sounds.  Pulmonary:     Effort: Pulmonary effort is normal.     Breath sounds: Normal breath sounds.  Skin:    Comments: The right shin looks much improved today. The erythema has faded considerably, and the open wounds have almost closed. The area is only slightly tender.   Neurological:     Mental Status: She is alert.          Assessment & Plan:  Cellulitis. We will give her a final shot of Rocephin today. She can then return only as needed.  Alysia Penna, MD

## 2021-04-23 ENCOUNTER — Other Ambulatory Visit: Payer: Self-pay | Admitting: Endocrinology

## 2021-05-11 ENCOUNTER — Other Ambulatory Visit: Payer: Self-pay | Admitting: Endocrinology

## 2021-05-11 DIAGNOSIS — E1165 Type 2 diabetes mellitus with hyperglycemia: Secondary | ICD-10-CM

## 2021-05-12 ENCOUNTER — Telehealth: Payer: Self-pay | Admitting: Family Medicine

## 2021-05-12 ENCOUNTER — Encounter: Payer: Self-pay | Admitting: Family Medicine

## 2021-05-12 ENCOUNTER — Ambulatory Visit: Payer: 59 | Admitting: Family Medicine

## 2021-05-12 VITALS — BP 128/82 | HR 89 | Temp 98.5°F | Wt 135.0 lb

## 2021-05-12 DIAGNOSIS — R0981 Nasal congestion: Secondary | ICD-10-CM | POA: Diagnosis not present

## 2021-05-12 DIAGNOSIS — J019 Acute sinusitis, unspecified: Secondary | ICD-10-CM

## 2021-05-12 LAB — POC COVID19 BINAXNOW: SARS Coronavirus 2 Ag: NEGATIVE

## 2021-05-12 MED ORDER — CEFTRIAXONE SODIUM 1 G IJ SOLR
1.0000 g | Freq: Once | INTRAMUSCULAR | Status: AC
  Administered 2021-05-12: 1 g via INTRAMUSCULAR

## 2021-05-12 NOTE — Progress Notes (Signed)
   Subjective:    [name], female    DOB: 12/18/1950, 70 y.o.   MRN: [id number]  HPI Here for 3 days of sinus pressure, ear pain, PND and a dry cough. No fever or SOB. No body aches or NVD. She has tested negative for the Covid-19 virus twice today. Taking Mucinex and Sudafed.    Review of Systems  Constitutional: Negative.   HENT:  Positive for congestion, ear pain, postnasal drip and sinus pressure. Negative for sore throat.   Eyes: Negative.   Respiratory:  Positive for cough.       Objective:   Physical Exam Constitutional:      Appearance: Normal appearance.  HENT:     Right Ear: Tympanic membrane, ear canal and external ear normal.     Left Ear: Tympanic membrane, ear canal and external ear normal.     Nose: Nose normal.     Mouth/Throat:     Pharynx: Oropharynx is clear.  Eyes:     Conjunctiva/sclera: Conjunctivae normal.  Cardiovascular:     Rate and Rhythm: Normal rate and regular rhythm.     Pulses: Normal pulses.     Heart sounds: Normal heart sounds.  Pulmonary:     Effort: Pulmonary effort is normal.     Breath sounds: Normal breath sounds.  Lymphadenopathy:     Cervical: No cervical adenopathy.  Neurological:     Mental Status: She is alert.          Assessment & Plan:  Sinusitis, treat with a shot of Rocephin. Recheck as needed.  Alysia Penna, MD

## 2021-05-12 NOTE — Telephone Encounter (Signed)
Patient calling in with respiratory symptoms: Shortness of breath, chest pain, palpitations or other red words send to Triage  Does the patient have a fever over 100, cough, congestion, sore throat, runny nose, lost of taste/smell within the last 5 days (please list symptoms that patient has)? Congestion, runny nose, possible sinus infection   Have you tested for Covid in the last 5 days? Yes   If yes, was it positive OR negative [x] ? If positive in the last 5 days, please schedule virtual visit now. If negative, schedule for an in person OV with the next available provider if PCP has no openings. Please also let patient know they will be tested again (follow the script below)  "you will have to arrive 64mins prior to your appt time to be Covid tested. Please park in back of office at the cone & call [phone] to let the staff know you have arrived. A staff member will meet you at your car to do a rapid covid test. Once the test has resulted you will be notified by phone of your results to determine if appt will remain an in person visit or be converted to a virtual/phone visit. If you arrive less than 33mins before your appt time, your visit will be automatically converted to virtual & any recommended testing will happen AFTER the visit."   [facility]  If no availability for virtual visit in office,  please schedule another [place] office  If no availability at another [place] office, please instruct patient that they can schedule an evisit or virtual visit through their mychart account. Visits up to 8pm  patients can be seen in office 5 days after positive COVID test

## 2021-05-12 NOTE — Addendum Note (Signed)
Addended by: Wyvonne Lenz on: 05/12/2021 03:49 PM   Modules accepted: Orders

## 2021-05-15 ENCOUNTER — Ambulatory Visit: Payer: 59 | Admitting: Obstetrics & Gynecology

## 2021-05-15 ENCOUNTER — Encounter: Payer: Self-pay | Admitting: Obstetrics & Gynecology

## 2021-05-15 ENCOUNTER — Other Ambulatory Visit: Payer: Self-pay

## 2021-05-15 ENCOUNTER — Other Ambulatory Visit (HOSPITAL_COMMUNITY)
Admission: RE | Admit: 2021-05-15 | Discharge: 2021-05-15 | Disposition: A | Discharge status: Home / Self Care | Payer: 59 | Source: Ambulatory Visit | Attending: Obstetrics & Gynecology | Admitting: Obstetrics & Gynecology

## 2021-05-15 ENCOUNTER — Other Ambulatory Visit (INDEPENDENT_AMBULATORY_CARE_PROVIDER_SITE_OTHER): Payer: 59

## 2021-05-15 VITALS — BP 120/86

## 2021-05-15 DIAGNOSIS — E1165 Type 2 diabetes mellitus with hyperglycemia: Secondary | ICD-10-CM

## 2021-05-15 DIAGNOSIS — R87810 Cervical high risk human papillomavirus (HPV) DNA test positive: Secondary | ICD-10-CM | POA: Diagnosis not present

## 2021-05-15 DIAGNOSIS — Z113 Encounter for screening for infections with a predominantly sexual mode of transmission: Secondary | ICD-10-CM | POA: Insufficient documentation

## 2021-05-15 DIAGNOSIS — N930 Postcoital and contact bleeding: Secondary | ICD-10-CM

## 2021-05-15 DIAGNOSIS — E7800 Pure hypercholesterolemia, unspecified: Secondary | ICD-10-CM

## 2021-05-15 LAB — BASIC METABOLIC PANEL
BUN: 10 mg/dL (ref 6–23)
CO2: 31 mEq/L (ref 19–32)
Calcium: 9.5 mg/dL (ref 8.4–10.5)
Chloride: 95 mEq/L — ABNORMAL LOW (ref 96–112)
Creatinine, Ser: 0.61 mg/dL (ref 0.40–1.20)
GFR: 91.45 mL/min (ref >60.00)
Glucose, Bld: 118 mg/dL — ABNORMAL HIGH (ref 70–99)
Potassium: 3.4 mEq/L — ABNORMAL LOW (ref 3.5–5.1)
Sodium: 135 mEq/L (ref 135–145)

## 2021-05-15 LAB — LDL CHOLESTEROL, DIRECT: Direct LDL: 158 mg/dL

## 2021-05-15 NOTE — Progress Notes (Signed)
    Miranda Cochran [birth year] [id number]        70 y.o.  G0 Stable boyfriend x 12/2019.  RP: Postcoital bleeding in menopause x a few weeks  HPI:  Postcoital bleeding in menopause x a few weeks.  No current pelvic pain.  No abnormal vaginal discharge.  Postmenopause, well on no HRT.  Sexually active with boyfriend x 12/2019.  Pap 07/2019 Negative, but HPV HR Positive.  HPV 16-18-45 Neg 04/2020.  Gono-Chlam Neg 04/2020.  Previously received the Hep B Immunization.   OB History  Gravida Para Term Preterm AB Living  0 0 0 0 0 0  SAB IAB Ectopic Multiple Live Births  0 0 0 0 0    Past medical history,surgical history, problem list, medications, allergies, family history and social history were all reviewed and documented in the EPIC chart.   Directed ROS with pertinent positives and negatives documented in the history of present illness/assessment and plan.  Exam:  Vitals:   05/15/21 1040  BP: 120/86   General appearance:  Normal  Abdomen: Normal  Gynecologic exam: Vulva normal.  Speculum:  cervix/Vagina normal.  No blood, normal secretions.  Pap/HPV HR, Gono-Chlam done.   Assessment/Plan:  70 y.o. G0  1. Postcoital bleeding Postcoital bleeding in menopause x a few weeks.  No current pelvic pain.  No abnormal vaginal discharge.  Postmenopause, well on no HRT.  Sexually active with boyfriend x 12/2019.  Pap 07/2019 Negative, but HPV HR Positive.  HPV 16-18-45 Neg 04/2020.  Gono-Chlam Neg 04/2020.  Previously received the Hep B Immunization.  Gyn exam normal today, no Cervical Polyp, vagina normal, vulva normal.  Pap/HR HPV/Gono-Chlam done today.  Will f/u with a Pelvic US to complete the investigation with an evaluation of the Endometrial line.  - US Transvaginal Non-OB; Future - Cytology - PAP( [place])  2. Cervical high risk HPV (human papillomavirus) test positive - Cytology - PAP( [place])  3. Screen for STD (sexually transmitted disease) - Cytology - PAP( [facility]) with HR HPV/Gono-Chlam  Other orders - furosemide (LASIX) 20 MG tablet; Take 20 mg by mouth daily.   [name], 10:59 AM 05/15/2021

## 2021-05-16 LAB — FRUCTOSAMINE: Fructosamine: 346 umol/L — ABNORMAL HIGH (ref 0–285)

## 2021-05-18 ENCOUNTER — Telehealth: Payer: Self-pay | Admitting: Family Medicine

## 2021-05-18 ENCOUNTER — Other Ambulatory Visit: Payer: Self-pay

## 2021-05-18 MED ORDER — AZITHROMYCIN 250 MG PO TABS: ORAL_TABLET | ORAL | 0 refills | Status: AC

## 2021-05-18 NOTE — Telephone Encounter (Signed)
Patient aware that prescription for Z-pack sent to [facility] on [address]

## 2021-05-18 NOTE — Telephone Encounter (Signed)
Call in a Zpack  ?

## 2021-05-18 NOTE — Telephone Encounter (Signed)
Patient is requesting a z-pack to be sent to her pharmacy because she still isn't feeling well since last visit on 05/12/21.  Patient could be contacted at [phone].  Please advise.

## 2021-05-18 NOTE — Telephone Encounter (Signed)
Please advise 

## 2021-05-19 ENCOUNTER — Ambulatory Visit: Payer: 59 | Admitting: Endocrinology

## 2021-05-20 ENCOUNTER — Encounter: Payer: Self-pay | Admitting: Internal Medicine

## 2021-05-20 ENCOUNTER — Ambulatory Visit: Payer: 59 | Admitting: Internal Medicine

## 2021-05-20 VITALS — BP 138/88 | HR 90 | Ht 66.0 in | Wt 132.0 lb

## 2021-05-20 DIAGNOSIS — K754 Autoimmune hepatitis: Secondary | ICD-10-CM

## 2021-05-20 DIAGNOSIS — K7460 Unspecified cirrhosis of liver: Secondary | ICD-10-CM

## 2021-05-20 DIAGNOSIS — D696 Thrombocytopenia, unspecified: Secondary | ICD-10-CM | POA: Diagnosis not present

## 2021-05-20 DIAGNOSIS — R1310 Dysphagia, unspecified: Secondary | ICD-10-CM

## 2021-05-20 LAB — CYTOLOGY - PAP
Chlamydia: NEGATIVE
Comment: NEGATIVE
Comment: NEGATIVE
Comment: NORMAL
Diagnosis: UNDETERMINED — AB
High risk HPV: POSITIVE — AB
Neisseria Gonorrhea: NEGATIVE

## 2021-05-20 NOTE — Progress Notes (Signed)
Miranda Cochran 70 y.o. 05/19/1951 [id number]  Assessment & Plan:   Encounter Diagnoses  Name Primary?   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  I broached doing EGD at hospital in case it makes sense to ablate GAVE again but she declined as after last procedure had epigastric pain w/ nausea and vomiting  Advised her to let us know if any changes to diabetes Tx as may affect pre-EGD instructions.  The risks and benefits as well as alternatives of endoscopic procedure(s) have been discussed and reviewed. All questions answered. The patient agrees to proceed.  Once again advised complete abstinence from EtOH  Cc:Fry, Ishmael Holter, MD Roosevelt Locks, NP   Subjective:   Chief Complaint: check for varices w/ EGD, dysphagia  HPI 70 yo ww w/ AIH and cirrhosis, still using some EtOH, here at request of Roosevelt Locks, NP re: needs screening EGD - ? Varices. She has hx GAVE and prior ablation, ferritin has held after that. She has been treated with steroids including budesonide, prednisone and also azathioprine and cellcept for AIH. All but prednisone stopped due to SE's. Prednisone does raise blood sugars. She is on 10 mg daily now. Has f/u endocrinology soon.  Also c/o dysphagia - pills only, exacerbated by sinus drainage which has been a problem of late.  Still uses EtOH " a couple of glasses of champagne on weekends only"  She is aware that is not a best practice for her. Wt Readings from Last 3 Encounters:  05/20/21 132 lb (59.9 kg)  05/12/21 135 lb (61.2 kg)  04/21/21 130 lb (59 kg)    Allergies  Allergen Reactions   Fentanyl Anxiety    Made her feel  very anxious, requests not to get it.   Levofloxacin Anxiety    Weird dreams    Amoxicillin Diarrhea    Has patient had a PCN reaction causing immediate rash, facial/tongue/throat swelling, SOB or lightheadedness with hypotension: No Has patient had a PCN reaction causing severe rash involving mucus membranes or skin necrosis: No Has patient had a PCN reaction that required hospitalization: No Has patient had a PCN reaction occurring within the last 10 years: Yes If all of the above answers are "NO", then may proceed with Cephalosporin use.    Augmentin [Amoxicillin-Pot Clavulanate] Other (See Comments)    diarrhea   Farxiga [Dapagliflozin] Diarrhea    Gi pain   Statins     Myalgia    Current Meds  Medication Sig   ALPRAZolam (XANAX) 0.5 MG tablet TAKE 1 TABLET(0.5 MG) BY MOUTH AT BEDTIME AS NEEDED FOR SLEEP   AMBULATORY NON FORMULARY MEDICATION ACTIVE LIVER Artichoke leaf extract, Milk thistle, Turmeric Take 1 capsule by mouth once daily   aspirin 325 MG tablet Take 650-975 mg by mouth daily as needed for moderate pain or headache.    azithromycin (ZITHROMAX) 250 MG tablet Take 2 tablets on day 1, then 1 tablet daily on days 2 through 5   Blood Glucose Monitoring Suppl ([place]) w/Device KIT Use Onetouch Verio Flex to check blood sugar once daily.   Continuous Blood Gluc Sensor (FREESTYLE LIBRE 2 SENSOR) MISC 2 Devices by  Does not apply route every 14 (fourteen) days.   cyclobenzaprine (FLEXERIL) 10 MG tablet TAKE 1 TABLET BY MOUTH EVERY 8 HOURS AS NEEDED FOR MUSCLE SPASMS WITH ONE BEING AT BEDTIME IF NEEDED   diphenhydrAMINE (BENADRYL) 25 mg capsule Take 25-50 mg by mouth daily as needed for allergies.    EFFEXOR XR 75 MG 24 hr capsule TAKE 1 CAPSULE BY MOUTH TWICE DAILY WITH A MEAL   ezetimibe (ZETIA) 10 MG tablet TAKE 1 TABLET(10 MG) BY MOUTH DAILY   ferrous gluconate (FERGON) 324 MG tablet Take 324 mg by mouth daily.   Fluticasone-Salmeterol (ADVAIR DISKUS)  100-50 MCG/DOSE AEPB Inhale 1 puff into the lungs 2 (two) times daily. (Patient taking differently: Inhale 1 puff into the lungs 2 (two) times daily as needed.)   furosemide (LASIX) 20 MG tablet Take 20 mg by mouth daily.   furosemide (LASIX) 40 MG tablet TAKE 1 TABLET(40 MG) BY MOUTH DAILY   Lancets (ONETOUCH DELICA PLUS [id number]) MISC USE AS DIRECTED UP TO 4 TIMES DAILY AS DIRECTED   lansoprazole (PREVACID) 30 MG capsule TAKE 1 CAPSULE(30 MG) BY MOUTH DAILY   methylPREDNISolone (MEDROL DOSEPAK) 4 MG TBPK tablet Take by mouth.   montelukast (SINGULAIR) 10 MG tablet Take 1 tablet (10 mg total) by mouth at bedtime.   Multiple Vitamins-Minerals (HAIR SKIN AND NAILS FORMULA) TABS Take 1 tablet by mouth daily.   ONETOUCH VERIO test strip USE TO CHECK BLOOD SUGAR ONCE DAILY   phenylephrine (SUDAFED PE) 10 MG TABS tablet Take 10 mg by mouth every 4 (four) hours as needed.   predniSONE (DELTASONE) 10 MG tablet Take 1 tablet by mouth daily.   promethazine (PHENERGAN) 25 MG tablet Take 1 tablet (25 mg total) by mouth every 8 (eight) hours as needed for nausea or vomiting.   repaglinide (PRANDIN) 1 MG tablet 2 tablets before lunch and dinner and 1 tablet at bedtime   sodium chloride (BRONCHO SALINE) inhaler solution Take 1 spray by nebulization as needed.   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   GERD (gastroesophageal reflux disease)    on meds   Hiatal hernia    Hx of adenomatous polyp of colon 07/07/2009   Hyperlipidemia    diet controlled   Hypertension    on meds -  LASIX   IBS (irritable bowel syndrome)    Iron deficiency anemia    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   Review of Systems  As above Objective:   Physical Exam BP 138/88   Pulse 90   Ht _0  (1.676 m)   Wt 132 lb (59.9 kg)   SpO2 98%   BMI 21.31 kg/m  WDWN NAD Anicteric Pharynx clear, teeth in good repair Lungs cta Cor NL S1S2 no rmg Abd mildly protuberant, small umbilical hernia soft and reducible   Data reviewed: recent PCP  and 2022 liver care notes

## 2021-05-20 NOTE — Patient Instructions (Signed)
If you are age 69 or older, your body mass index should be between 23-30. Your Body mass index is 21.31 kg/m. If this is out of the aforementioned range listed, please consider follow up with your Primary Care Provider.  If you are age 110 or younger, your body mass index should be between 19-25. Your Body mass index is 21.31 kg/m. If this is out of the aformentioned range listed, please consider follow up with your Primary Care Provider.   ________________________________________________________  The [facility] providers would like to encourage you to use [facility] to communicate with providers for non-urgent requests or questions.  Due to long hold times on the telephone, sending your provider a message by [facility] may be a faster and more efficient way to get a response.  Please allow 48 business hours for a response.  Please remember that this is for non-urgent requests.  _______________________________________________________  Dennis Bast have been scheduled for an endoscopy. Please follow written instructions given to you at your visit today. If you use inhalers (even only as needed), please bring them with you on the day of your procedure.  I appreciate the opportunity to care for you. Silvano Rusk, MD, [facility]

## 2021-05-22 ENCOUNTER — Other Ambulatory Visit: Payer: Self-pay | Admitting: *Deleted

## 2021-05-22 DIAGNOSIS — R87620 Atypical squamous cells of undetermined significance on cytologic smear of vagina (ASC-US): Secondary | ICD-10-CM

## 2021-05-23 ENCOUNTER — Other Ambulatory Visit: Payer: Self-pay | Admitting: Internal Medicine

## 2021-05-23 DIAGNOSIS — K219 Gastro-esophageal reflux disease without esophagitis: Secondary | ICD-10-CM

## 2021-05-26 ENCOUNTER — Ambulatory Visit: Payer: 59 | Admitting: Endocrinology

## 2021-05-28 ENCOUNTER — Telehealth: Payer: Self-pay

## 2021-05-28 DIAGNOSIS — R52 Pain, unspecified: Secondary | ICD-10-CM

## 2021-05-28 NOTE — Telephone Encounter (Signed)
I have started a prior authorization for Miranda Cochran's brand name Effexor thru [employer]. The generic is ineffective. Will await the outcome.

## 2021-06-04 NOTE — Telephone Encounter (Signed)
The Effexor has been denied. I have filed an urgent appeal.

## 2021-06-05 NOTE — Telephone Encounter (Signed)
Patient informed of the status of her rx and that we are waiting to hear back on the appeal.

## 2021-06-12 NOTE — Telephone Encounter (Signed)
So I was on hold with her insurance company for 45 minutes trying to get a status on the appeal . The first rep thinks it shows denied but he said they have a newer computer system that he can't get into. I guess he was unable to reach someone to help me. After 45 minute hold I hung up and called Miranda Cochran. She said she got notification earlier this week that the appeal was denied. She is going to call them next week and see if she can get more information.

## 2021-06-22 ENCOUNTER — Telehealth: Payer: Self-pay | Admitting: Internal Medicine

## 2021-06-22 ENCOUNTER — Other Ambulatory Visit: Payer: Self-pay | Admitting: Endocrinology

## 2021-06-22 NOTE — Telephone Encounter (Signed)
Miranda Cochran and I spoke, she is going to call and see about getting patient assistance to help pay for the brand name effexor since insurance denied the Brand name. She will call me back with the information she finds out.

## 2021-06-22 NOTE — Telephone Encounter (Signed)
See phone note from 05/28/2021 for more information.

## 2021-06-22 NOTE — Telephone Encounter (Signed)
Patient's insurance won't cover the Effexor XR anymore and she is asking if there is a generic equivalent that can be prescribed for her.  Please call patient and advise.  Thank you.

## 2021-06-30 ENCOUNTER — Other Ambulatory Visit: Payer: Self-pay

## 2021-06-30 ENCOUNTER — Ambulatory Visit: Payer: 59 | Admitting: Endocrinology

## 2021-06-30 ENCOUNTER — Encounter: Payer: Self-pay | Admitting: Endocrinology

## 2021-06-30 VITALS — BP 160/94 | HR 95 | Ht 66.0 in | Wt 131.8 lb

## 2021-06-30 DIAGNOSIS — E876 Hypokalemia: Secondary | ICD-10-CM | POA: Diagnosis not present

## 2021-06-30 DIAGNOSIS — E1165 Type 2 diabetes mellitus with hyperglycemia: Secondary | ICD-10-CM | POA: Diagnosis not present

## 2021-06-30 DIAGNOSIS — E7800 Pure hypercholesterolemia, unspecified: Secondary | ICD-10-CM

## 2021-06-30 MED ORDER — INSULIN LISPRO PROT & LISPRO (75-25 MIX) 100 UNIT/ML KWIKPEN: 20.0000 [IU] | PEN_INJECTOR | Freq: Every day | SUBCUTANEOUS | 1 refills | Status: DC

## 2021-06-30 MED ORDER — INSULIN PEN NEEDLE 31G X 5 MM MISC: 1 refills | Status: DC

## 2021-06-30 MED ORDER — TRULICITY 1.5 MG/0.5ML ~~LOC~~ SOAJ: SUBCUTANEOUS | 1 refills | Status: DC

## 2021-06-30 NOTE — Patient Instructions (Signed)
insulin: provides blood sugar control for up to 24 hours.    Start with 10 units 10-15 MIN BEFORE lunch daily and increase by 2 units every 3 days until the SUPPER TIME sugars are under 150   If blood sugar is under 100 for 2 days in a row, reduce the dose by 2 units.    STOP PRANDIN

## 2021-06-30 NOTE — Progress Notes (Addendum)
[name], female   DOB: [birth year], 71 y.o.   MRN: [id number]          Reason for Appointment: Follow-up for Type 2 Diabetes    History of Present Illness:          Date of diagnosis of type 2 diabetes mellitus: ?  2013        Background history:   She had a blood sugar over 200 in 2013 but was not told to have diabetes until 04/2015 A1c was 7.3 in 3/16 In 04/2015 blood sugar was 478 says her blood sugar was 600 when she was diagnosed and was not having any significant symptoms Initially treated with Metaglip but this apparently caused abdominal discomfort and she was switched to metformin In 2017 she had been tried on Bydureon for better control but she stopped this after a few weeks because of large persisted nodules in her skin She thinks her blood sugars were better with this and A1c was down to 6.8  Recent history:   Non-insulin hypoglycemic drugs the patient is taking are: Trulicity 0 mg weekly, Prandin 1-2 mg twice daily  Her A1c is higher than usual at 9.9  Fructosamine is lower at 346    Current management, blood sugar patterns and problems identified:   She has been off her Trulicity for at least a month or more since it was not available at the pharmacy at the 3 mg dose She did not call to report this  Since late December her blood sugars have been progressively higher; previously were ranging from about 135-178 before hypoglycemia She has continued treatment with 10 mg prednisone once daily which she normally takes around noon time with her first meal She says she has been feeling more thirsty but not unusually tired Highest reading was 485 yesterday before dinnertime without any unusual diet She is usually avoiding any drinks with sugar Her weight is down 5 pounds since last visit Generally checking blood sugars only once a day and recently her first reading of the day is between 268 and 339 at dinnertime when checked        Side effects from  medications have been: Metaglip caused abdominal pain, Tradjenta caused swelling of the legs, Bydureon caused skin nodules, Farxiga caused diarrhea, glipizide causes dizziness and weakness, Metformin caused GI side effects, Invokana causes yeast infections   Glucose monitoring:  done less than 1 times a day         Glucometer: One Verio     Blood Glucose readings by time of day by monitor download:    PRE-MEAL Fasting Lunch Dinner Bedtime Overall  Glucose range: 135-339  382-485  135-485  Mean/median: 250  434  297   PREVIOUSLY AVERAGE 152 103-191   Self-care:  Typical meal intake: Breakfast is Oatmeal, usually eating low-fat dinner and her snacks will be fruit, peanut butter crackers                Dietician visit, most recent: None               Weight history:  Wt Readings from Last 3 Encounters:  06/30/21 131 lb 12.8 oz (59.8 kg)  05/20/21 132 lb (59.9 kg)  05/12/21 135 lb (61.2 kg)    Glycemic control:   Lab Results  Component Value Date   HGBA1C 7.3 (H) 03/13/2021   HGBA1C 7.2 (H) 11/18/2020   HGBA1C 5.5 07/14/2020   Lab Results  Component Value Date  MICROALBUR 2.2 (H) 07/14/2020   LDLCALC 144 (H) 03/13/2021   CREATININE 0.61 05/15/2021   Lab Results  Component Value Date   MICRALBCREAT 10.2 07/14/2020    Lab Results  Component Value Date   FRUCTOSAMINE 346 (H) 05/15/2021   FRUCTOSAMINE 386 (H) 03/13/2021   FRUCTOSAMINE 381 (H) 01/28/2021    No visits with results within 1 Week(s) from this visit.  Latest known visit with results is:  Appointment on 05/15/2021  Component Date Value Ref Range Status   Direct LDL 05/15/2021 158.0  mg/dL Final   Optimal:  <100 mg/dLNear or Above Optimal:  100-129 mg/dLBorderline High:  130-159 mg/dLHigh:  160-189 mg/dLVery High:  >190 mg/dL   Sodium 05/15/2021 135  135 - 145 mEq/L Final   Potassium 05/15/2021 3.4 (L)  3.5 - 5.1 mEq/L Final   Chloride 05/15/2021 95 (L)  96 - 112 mEq/L Final   CO2 05/15/2021 31  19  - 32 mEq/L Final   Glucose, Bld 05/15/2021 118 (H)  70 - 99 mg/dL Final   BUN 05/15/2021 10  6 - 23 mg/dL Final   Creatinine, Ser 05/15/2021 0.61  0.40 - 1.20 mg/dL Final   GFR 05/15/2021 91.45  >60.00 mL/min Final   Calculated using the CKD-EPI Creatinine Equation (2021)   Calcium 05/15/2021 9.5  8.4 - 10.5 mg/dL Final   Fructosamine 05/15/2021 346 (H)  0 - 285 umol/L Final   Comment: Published reference interval for apparently healthy subjects between age 50 and 26 is 50 - 285 umol/L and in a poorly controlled diabetic population is 228 - 563 umol/L with a mean of 396 umol/L.      Allergies as of 06/30/2021       Reactions   Fentanyl Anxiety   Made her feel very anxious, requests not to get it.   Levofloxacin Anxiety   Weird dreams    Amoxicillin Diarrhea   Has patient had a PCN reaction causing immediate rash, facial/tongue/throat swelling, SOB or lightheadedness with hypotension: No Has patient had a PCN reaction causing severe rash involving mucus membranes or skin necrosis: No Has patient had a PCN reaction that required hospitalization: No Has patient had a PCN reaction occurring within the last 10 years: Yes If all of the above answers are "NO", then may proceed with Cephalosporin use.   Augmentin [amoxicillin-pot Clavulanate] Other (See Comments)   diarrhea   Farxiga [dapagliflozin] Diarrhea   Gi pain   Statins    Myalgia         Medication List        Accurate as of June 30, 2021 12:05 PM. If you have any questions, ask your nurse or doctor.          ALPRAZolam 0.5 MG tablet Commonly known as: XANAX TAKE 1 TABLET(0.5 MG) BY MOUTH AT BEDTIME AS NEEDED FOR SLEEP   AMBULATORY NON FORMULARY MEDICATION ACTIVE LIVER Artichoke leaf extract, Milk thistle, Turmeric Take 1 capsule by mouth once daily   aspirin 325 MG tablet Take 650-975 mg by mouth daily as needed for moderate pain or headache.   cyclobenzaprine 10 MG tablet Commonly known as:  FLEXERIL TAKE 1 TABLET BY MOUTH EVERY 8 HOURS AS NEEDED FOR MUSCLE SPASMS WITH ONE BEING AT BEDTIME IF NEEDED   diphenhydrAMINE 25 mg capsule Commonly known as: BENADRYL Take 25-50 mg by mouth daily as needed for allergies.   Effexor XR 75 MG 24 hr capsule Generic drug: venlafaxine XR TAKE 1 CAPSULE BY MOUTH TWICE DAILY WITH  A MEAL   ezetimibe 10 MG tablet Commonly known as: ZETIA TAKE 1 TABLET(10 MG) BY MOUTH DAILY   ferrous gluconate 324 MG tablet Commonly known as: FERGON Take 324 mg by mouth daily.   Fluticasone-Salmeterol 100-50 MCG/DOSE Aepb Commonly known as: Advair Diskus Inhale 1 puff into the lungs 2 (two) times daily. What changed:  when to take this reasons to take this   FreeStyle Libre 2 Sensor Misc 2 Devices by Does not apply route every 14 (fourteen) days.   furosemide 40 MG tablet Commonly known as: LASIX TAKE 1 TABLET(40 MG) BY MOUTH DAILY   furosemide 20 MG tablet Commonly known as: LASIX Take 20 mg by mouth daily.   Hair Skin and Nails Formula Tabs Take 1 tablet by mouth daily.   lansoprazole 30 MG capsule Commonly known as: PREVACID TAKE 1 CAPSULE(30 MG) BY MOUTH DAILY   methylPREDNISolone 4 MG Tbpk tablet Commonly known as: MEDROL DOSEPAK Take by mouth.   montelukast 10 MG tablet Commonly known as: SINGULAIR Take 1 tablet (10 mg total) by mouth at bedtime.   OneTouch Delica Plus [id number] Misc USE AS DIRECTED UP TO 4 TIMES DAILY AS DIRECTED   OneTouch Verio Flex System w/Device Kit Use Onetouch Verio Flex to check blood sugar once daily.   OneTouch Verio test strip Generic drug: glucose blood USE TO CHECK BLOOD SUGAR ONCE DAILY   phenylephrine 10 MG Tabs tablet Commonly known as: SUDAFED PE Take 10 mg by mouth every 4 (four) hours as needed.   promethazine 25 MG tablet Commonly known as: PHENERGAN Take 1 tablet (25 mg total) by mouth every 8 (eight) hours as needed for nausea or vomiting.   repaglinide 1 MG tablet Commonly  known as: PRANDIN TAKE 2 TABLETS BY MOUTH BEFORE LUNCH, 2 TABLETS BY MOUTH BEFORE DINNER AND 1 TABLET BY MOUTH BEFORE BEDTIME AS DIRECTED   sodium chloride inhaler solution Commonly known as: BRONCHO SALINE Take 1 spray by nebulization as needed.   Trulicity 3 YQ/6.5HQ Sopn Generic drug: Dulaglutide ADMINISTER 0.5 ML(3 MG) UNDER THE SKIN 1 TIME WEEKLY        Allergies:  Allergies  Allergen Reactions   Fentanyl Anxiety    Made her feel very anxious, requests not to get it.   Levofloxacin Anxiety    Weird dreams    Amoxicillin Diarrhea    Has patient had a PCN reaction causing immediate rash, facial/tongue/throat swelling, SOB or lightheadedness with hypotension: No Has patient had a PCN reaction causing severe rash involving mucus membranes or skin necrosis: No Has patient had a PCN reaction that required hospitalization: No Has patient had a PCN reaction occurring within the last 10 years: Yes If all of the above answers are "NO", then may proceed with Cephalosporin use.    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   GERD (gastroesophageal reflux disease)    on meds   Hiatal hernia  Hx of adenomatous polyp of colon 07/07/2009   Hyperlipidemia    diet controlled   Hypertension    on meds - LASIX   IBS (irritable bowel syndrome)    Iron deficiency anemia    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 Carlean Purl, adenimatous polyps, repeat in 7 yr   ESOPHAGOGASTRODUODENOSCOPY  02/2002   ESOPHAGOGASTRODUODENOSCOPY (EGD) WITH PROPOFOL N/A 06/20/2017   Procedure: ESOPHAGOGASTRODUODENOSCOPY (EGD) WITH PROPOFOL;  Surgeon: Gatha Mayer, MD;  Location: [facility];  Service: Endoscopy;  Laterality: N/A;  with Barrx   EYE SURGERY     NASAL SEPTOPLASTY W/ TURBINOPLASTY     POLYPECTOMY  2015   CG-TA   TONSILLECTOMY      Family History  Problem Relation Age of Onset   Stroke Mother    Hypertension Mother    Colon cancer Father 60   Liver cancer Father 41   Colon polyps Father 74   Heart disease Sister        hole in heart   Stroke Maternal Grandmother    Hypertension Maternal Grandmother    Esophageal cancer Maternal Grandfather    Throat cancer Maternal Grandfather    Breast cancer Paternal Grandmother    Brain cancer Paternal Grandmother    Lung cancer Paternal Grandfather    Pancreatic cancer Maternal Aunt    Brain cancer Maternal Uncle    Diabetes Neg Hx    Rectal cancer Neg Hx    Stomach cancer Neg Hx     Social History:  reports that she has never smoked. She has never used smokeless tobacco. She reports current alcohol use. She reports that she does not use drugs.   Review of Systems   Lipid history: She has persistent hypercholesterolemia Highest baseline LDL about 170  Previously had tried Zocor which apparently caused muscle aches, she took Crestor and she thinks generic causes muscle cramps She took Livalo for a couple of days but she thinks she didn't feel well with this and had some palpitations reportedly  She has been prescribed Zetia but appears to be irregular with this  LDL has been as low as 97  when she was taking this   Lab Results  Component Value Date   CHOL 235 (H) 03/13/2021   CHOL 163 07/14/2020   CHOL 236 (H) 08/01/2019   Lab Results  Component Value Date   HDL 61.80 03/13/2021   HDL 41.50 07/14/2020   HDL 59.40 08/01/2019   Lab Results  Component Value Date   LDLCALC 144 (H) 03/13/2021   LDLCALC 97 07/14/2020   LDLCALC 156 (H) 08/01/2019   Lab Results  Component Value Date   TRIG 149.0 03/13/2021   TRIG 120.0 07/14/2020   TRIG 104.0 08/01/2019   Lab Results  Component Value Date   CHOLHDL 4 03/13/2021   CHOLHDL 4 07/14/2020   CHOLHDL 4 08/01/2019   Lab Results  Component Value Date   LDLDIRECT 158.0 05/15/2021   LDLDIRECT 153.0 02/27/2019           Lab Results  Component Value  Date   ALT 23 03/13/2021    Hypertension: She states she has whitecoat syndrome causing high readings Her blood pressure readings are generally normal at home Has not compared home monitor to the office reading   BP Readings from Last 3 Encounters:  06/30/21 (!) 160/94  05/20/21 138/88  05/15/21 120/86    She takes Lasix  for complaints of having swelling in her abdomen Potassium has previously been normal but again it is low, she is also on prednisone She refuses to take potassium supplements  Lab Results  Component Value Date   K 3.4 (L) 05/15/2021     Most recent eye exam was 10/20  Most recent foot exam: 2/22  Has had normal thyroid levels previously, benign thyroid nodule in 2012 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  Latest known visit with results is:  Appointment on 05/15/2021  Component Date Value Ref Range Status   Direct LDL 05/15/2021 158.0  mg/dL Final   Optimal:  <100 mg/dLNear or Above Optimal:  100-129 mg/dLBorderline High:  130-159 mg/dLHigh:  160-189 mg/dLVery High:  >190 mg/dL    Sodium 05/15/2021 135  135 - 145 mEq/L Final   Potassium 05/15/2021 3.4 (L)  3.5 - 5.1 mEq/L Final   Chloride 05/15/2021 95 (L)  96 - 112 mEq/L Final   CO2 05/15/2021 31  19 - 32 mEq/L Final   Glucose, Bld 05/15/2021 118 (H)  70 - 99 mg/dL Final   BUN 05/15/2021 10  6 - 23 mg/dL Final   Creatinine, Ser 05/15/2021 0.61  0.40 - 1.20 mg/dL Final   GFR 05/15/2021 91.45  >60.00 mL/min Final   Calculated using the CKD-EPI Creatinine Equation (2021)   Calcium 05/15/2021 9.5  8.4 - 10.5 mg/dL Final   Fructosamine 05/15/2021 346 (H)  0 - 285 umol/L Final   Comment: Published reference interval for apparently healthy subjects between age 69 and 17 is 4 - 285 umol/L and in a poorly controlled diabetic population is 228 - 563 umol/L with a mean of 396 umol/L.     Physical Examination:  BP (!) 160/94    Pulse 95    Ht _0  (1.676 m)    Wt 131 lb 12.8 oz (59.8 kg)    SpO2 97%    BMI 21.27 kg/m       ASSESSMENT:  Diabetes type 2, nonobese  See history of present illness for detailed discussion of current diabetes management, blood sugar patterns and problems identified  A1c has gone up significantly to 9.9 However fructosamine is only 346  Since she is taking prednisone her blood sugars have been difficult to control and now has significant hyperglycemia with running out of her Trulicity She did not call to change her Trulicity dose She appears to be symptomatic from hyperglycemia with increased thirst and some weight loss Blood sugars are higher later in the day and still high in the morning, frequently over 300  HYPOKALEMIA is persistent, will forward results to PCP  Hypercholesterolemia: Not controlled from her not taking Zetia regularly again  PLAN:        She will start INSULIN in addition to resuming Trulicity  Discussed how PREMIXED insulin works, timing of injection, how to do the injection with the pen, dosage at start, injection sites.  She can start  with 10 units  before her first meal around lunchtime  Also discussed titration based on before supper blood sugar every 3 days by 2 units and at target of under 150 for now.   She will reduce the dose only if blood sugars are below 100 at suppertime Education brochure on the insulin and insulin pen usage given Restart TRULICITY, she will need to start back on 0.75 for 2 weeks and then take 1.5 mg weekly Since repaglinide is not effective she can stop that  Needs to take ezetimibe regularly  Results of potassium forwarded to PCP for further management  Patient Instructions    insulin: provides blood sugar control for up to 24 hours.    Start with 10 units 10-15 MIN BEFORE lunch daily and increase by 2 units every 3 days until the SUPPER TIME sugars are under 150   If blood sugar is under 100 for 2 days in a row, reduce the dose by 2 units.    STOP PRANDIN   Total visit time including counseling = 30 minutes    Elayne Snare 06/30/2021, 12:05 PM   Note: This office note was prepared with Dragon voice recognition system technology. Any transcriptional errors that result from this process are unintentional.

## 2021-06-30 NOTE — Telephone Encounter (Signed)
I left her a detailed message checking in to see if she has found out anything about assistance for the brand name effexor.

## 2021-07-02 ENCOUNTER — Encounter: Payer: Self-pay | Admitting: Internal Medicine

## 2021-07-02 ENCOUNTER — Other Ambulatory Visit: Payer: Self-pay

## 2021-07-02 ENCOUNTER — Encounter: Payer: Self-pay | Admitting: Obstetrics & Gynecology

## 2021-07-02 ENCOUNTER — Ambulatory Visit (INDEPENDENT_AMBULATORY_CARE_PROVIDER_SITE_OTHER): Payer: 59

## 2021-07-02 ENCOUNTER — Ambulatory Visit: Payer: 59 | Admitting: Obstetrics & Gynecology

## 2021-07-02 VITALS — BP 114/70

## 2021-07-02 DIAGNOSIS — N930 Postcoital and contact bleeding: Secondary | ICD-10-CM

## 2021-07-02 DIAGNOSIS — R300 Dysuria: Secondary | ICD-10-CM

## 2021-07-02 DIAGNOSIS — N762 Acute vulvitis: Secondary | ICD-10-CM | POA: Diagnosis not present

## 2021-07-02 DIAGNOSIS — R87810 Cervical high risk human papillomavirus (HPV) DNA test positive: Secondary | ICD-10-CM

## 2021-07-02 DIAGNOSIS — R87620 Atypical squamous cells of undetermined significance on cytologic smear of vagina (ASC-US): Secondary | ICD-10-CM | POA: Diagnosis not present

## 2021-07-02 LAB — WET PREP FOR TRICH, YEAST, CLUE

## 2021-07-02 MED ORDER — CLOBETASOL PROPIONATE 0.05 % EX OINT: 1.0000 "application " | TOPICAL_OINTMENT | Freq: Two times a day (BID) | CUTANEOUS | 2 refills | Status: AC

## 2021-07-02 MED ORDER — FLUCONAZOLE 150 MG PO TABS: 150.0000 mg | ORAL_TABLET | Freq: Once | ORAL | 0 refills | Status: AC

## 2021-07-02 NOTE — Progress Notes (Signed)
Miranda Cochran 1950/08/30 [id number]        70 y.o.  G0 Stable boyfriend x 12/2019.   RP: Postcoital bleeding in menopause for Pelvic US   HPI:  Postcoital bleeding in menopause x a few weeks in November 2022.  No vaginal bleeding currently.  No pelvic pain.  No abnormal vaginal discharge, but very irritated vulva with itching. Burning with urination.  Postmenopause, well on no HRT.  Sexually active with boyfriend x 12/2019.  Pap 07/2019 Negative, but HPV HR Positive.  HPV 16-18-45 Neg 04/2020.  Gono-Chlam Neg 04/2020.  Previously received the Hep B Immunization.    OB History  Gravida Para Term Preterm AB Living  0 0 0 0 0 0  SAB IAB Ectopic Multiple Live Births  0 0 0 0 0    Past medical history,surgical history, problem list, medications, allergies, family history and social history were all reviewed and documented in the EPIC chart.   Directed ROS with pertinent positives and negatives documented in the history of present illness/assessment and plan.  Exam:  Vitals:   07/02/21 1012  BP: 114/70   General appearance:  Normal  Pelvic US today: T/V images.  Anteverted atrophic uterus with multiple calcified intramural and subserosal fibroids.  The uterus is measured at 6.65 x 4.53 x 2.9 cm.  The largest fibroid seen is not calcified, located anterior fundal and measured at 1.9 x 1.7 cm.  The endometrial lining is symmetrical and seen measured at 2.8 mm.  It is slightly distorted by a posterior fibroid.  Trace amount of fluid seen within the canal.  No obvious mass or thickening seen.  Left ovary is atrophic and normal.  The right ovary is not visualized abdominally or vaginally.  No adnexal mass seen.  No free fluid in the pelvis.  Gyn exam:  Vulva:  Erythema/inflammation +++ with mild thick discharge.  Wet prep done.  Wet prep: Negative U/A:  Yellow cloudy, Protein 1+, Nitrite Neg, WBC 6-10, RBC 0-2, Bacteria Moderate.  Urine Culture pending.   Assessment/Plan:  71 y.o.  G0P0000   1. Postcoital bleeding Postcoital bleeding in menopause x a few weeks in November 2022.  No vaginal bleeding currently.  No pelvic pain.  No abnormal vaginal discharge, but very irritated vulva with itching. Burning with urination.  Postmenopause, well on no HRT.  Sexually active with boyfriend x 12/2019.  Pap 07/2019 Negative, but HPV HR Positive.  HPV 16-18-45 Neg 04/2020.  Gono-Chlam Neg 04/2020.  Previously received the Hep B Immunization.  Pelvic US findings thoroughly reviewed with patient.  Reassured that her Endometrial lining is normal and thin.    2. Acute vulvitis Wet prep Negative.  Acute contact vulvitis.  Will treat with Clobetasol ointment.  Usage reviewed and prescription sent to pharmacy. - WET PREP FOR TRICH, YEAST, CLUE  3. Burning with urination Will wait on U. Culture results to decide if treatment is needed. - Urinalysis,Complete w/RFL Culture  4. Pap smear of vagina with ASC-US F/U Colposcopy.  5. Cervical high risk HPV (human papillomavirus) test positive F/U Colposcopy.  Other orders - cetirizine (ZYRTEC) 10 MG tablet; Take 10 mg by mouth daily. - fluticasone-salmeterol (ADVAIR) 100-50 MCG/ACT AEPB; Inhale 1 puff into the lungs 2 (two) times daily. - predniSONE (DELTASONE) 10 MG tablet; Take 10 mg by mouth daily with breakfast. - fluconazole (DIFLUCAN) 150 MG tablet; Take 1 tablet (150 mg total) by mouth once for 1 dose. - clobetasol ointment (TEMOVATE) 0.05 %; Apply 1  application topically 2 (two) times daily for 14 days. Thin vulvar application on affected vulva.  Progressively wean after 2 weeks.   [name], 10:18 AM 07/02/2021

## 2021-07-04 LAB — URINALYSIS, COMPLETE W/RFL CULTURE
Crystals: NONE SEEN /HPF
Glucose, UA: NEGATIVE
Hgb urine dipstick: NEGATIVE
Nitrites, Initial: NEGATIVE
Specific Gravity, Urine: 1.01 (ref 1.001–1.035)
Yeast: NONE SEEN /HPF
pH: 5.5 (ref 5.0–8.0)

## 2021-07-04 LAB — URINE CULTURE
MICRO NUMBER:: 12892090
SPECIMEN QUALITY:: ADEQUATE

## 2021-07-04 LAB — CULTURE INDICATED

## 2021-07-06 ENCOUNTER — Ambulatory Visit (AMBULATORY_SURGERY_CENTER): Payer: 59 | Admitting: Internal Medicine

## 2021-07-06 ENCOUNTER — Encounter: Payer: Self-pay | Admitting: Internal Medicine

## 2021-07-06 VITALS — BP 134/70 | HR 75 | Temp 98.9°F | Resp 16 | Ht 66.0 in | Wt 132.0 lb

## 2021-07-06 DIAGNOSIS — K3189 Other diseases of stomach and duodenum: Secondary | ICD-10-CM

## 2021-07-06 DIAGNOSIS — K7460 Unspecified cirrhosis of liver: Secondary | ICD-10-CM | POA: Diagnosis not present

## 2021-07-06 DIAGNOSIS — K766 Portal hypertension: Secondary | ICD-10-CM | POA: Diagnosis not present

## 2021-07-06 DIAGNOSIS — K319 Disease of stomach and duodenum, unspecified: Secondary | ICD-10-CM

## 2021-07-06 HISTORY — PX: ESOPHAGOGASTRODUODENOSCOPY: SHX1529

## 2021-07-06 MED ORDER — SODIUM CHLORIDE 0.9 % IV SOLN: 500.0000 mL | Freq: Once | INTRAVENOUS | Status: DC

## 2021-07-06 NOTE — Progress Notes (Signed)
Report to PACU, RN, vss, BBS= Clear.  

## 2021-07-06 NOTE — Progress Notes (Signed)
[facility] History and Physical   Primary Care Physician:  Laurey Morale, MD   Reason for Procedure:   Screen varices and dysphagia  Plan:    EGD     HPI: Miranda Cochran is a 71 y.o. 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   GERD (gastroesophageal reflux disease)    on meds   Hiatal hernia    Hx of adenomatous polyp of colon 07/07/2009   Hyperlipidemia    diet controlled   Hypertension    on meds - LASIX   IBS (irritable bowel syndrome)    Iron deficiency anemia    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 Carlean Purl, adenimatous polyps, repeat in 7 yr   ESOPHAGOGASTRODUODENOSCOPY  02/2002   ESOPHAGOGASTRODUODENOSCOPY (EGD) WITH PROPOFOL N/A 06/20/2017   Procedure: ESOPHAGOGASTRODUODENOSCOPY (EGD) WITH PROPOFOL;  Surgeon: Gatha Mayer, MD;  Location: [facility];  Service: Endoscopy;  Laterality: N/A;  with Barrx   EYE SURGERY     NASAL SEPTOPLASTY W/ TURBINOPLASTY     POLYPECTOMY  2015   CG-TA   TONSILLECTOMY      Prior to Admission medications   Medication Sig Start Date End Date Taking? Authorizing Provider  ALPRAZolam Duanne Moron) 0.5 MG tablet TAKE 1 TABLET(0.5 MG) BY MOUTH AT BEDTIME AS  NEEDED FOR SLEEP 04/21/21  Yes Laurey Morale, MD  Blood Glucose Monitoring Suppl ([place]) w/Device KIT Use Onetouch Verio Flex to check blood sugar once daily. 12/22/18  Yes Elayne Snare, MD  cetirizine (ZYRTEC) 10 MG tablet Take 10 mg by mouth daily.   Yes [provider]  clobetasol ointment (TEMOVATE) 0.94 % Apply 1 application topically 2 (two) times daily for 14 days. Thin vulvar application on affected vulva.  Progressively wean after 2 weeks. 07/02/21 07/16/21 Yes [name], MD  Continuous Blood Gluc Sensor (FREESTYLE LIBRE 2 SENSOR) MISC 2 Devices by Does not apply route every 14 (fourteen) days. 03/24/21  Yes Elayne Snare, MD  cyclobenzaprine (FLEXERIL) 10 MG tablet TAKE 1 TABLET BY MOUTH EVERY 8 HOURS AS NEEDED FOR MUSCLE SPASMS WITH ONE BEING AT BEDTIME IF NEEDED 10/16/19  Yes Laurey Morale, MD  Dulaglutide (TRULICITY) 1.5 MH/6.8GS SOPN Inject weekly 06/30/21  Yes Elayne Snare, MD  EFFEXOR XR 75 MG 24 hr capsule TAKE 1 CAPSULE BY MOUTH TWICE DAILY WITH A MEAL 03/13/21  Yes Gatha Mayer, MD  ezetimibe (ZETIA) 10 MG tablet TAKE 1 TABLET(10 MG) BY MOUTH DAILY 04/23/21  Yes Elayne Snare, MD  furosemide (LASIX) 20 MG tablet Take 20 mg by mouth daily.  12/27/20  Yes [provider]  furosemide (LASIX) 40 MG tablet TAKE 1 TABLET(40 MG) BY MOUTH DAILY 10/24/20  Yes Laurey Morale, MD  Insulin Lispro Prot & Lispro (HUMALOG MIX 75/25 KWIKPEN) (75-25) 100 UNIT/ML Kwikpen Inject 20 Units into the skin daily before breakfast. Adjust dose as directed 06/30/21  Yes Elayne Snare, MD  Insulin Pen Needle 31G X 5 MM MISC Use once a day with insulin pen 06/30/21  Yes Elayne Snare, MD  Lancets [facility] DELICA PLUS [id number]) MISC USE AS DIRECTED UP TO 4 TIMES DAILY AS DIRECTED 12/04/18  Yes Elayne Snare, MD  lansoprazole (PREVACID) 30 MG capsule TAKE 1 CAPSULE(30 MG) BY MOUTH DAILY 04/21/21  Yes Laurey Morale, MD  Multiple Vitamins-Minerals (HAIR SKIN AND NAILS FORMULA) TABS Take  1 tablet by mouth daily.   Yes [provider]  ONETOUCH VERIO test strip USE TO CHECK BLOOD SUGAR ONCE DAILY 06/11/20  Yes Elayne Snare, MD  predniSONE (DELTASONE) 10 MG tablet Take 10 mg by mouth daily with breakfast.   Yes [provider]  AMBULATORY NON [place] leaf extract, Milk thistle, Turmeric Take 1 capsule by mouth once daily Patient not taking: Reported on 07/02/2021    [provider]  aspirin 325 MG tablet Take 650-975 mg by mouth daily as needed for moderate pain or headache.     [provider]  diphenhydrAMINE (BENADRYL) 25 mg capsule Take 25-50 mg by mouth daily as needed for allergies.     [provider]  fluticasone-salmeterol (ADVAIR) 100-50 MCG/ACT AEPB Inhale 1 puff into the lungs 2 (two) times daily.    [provider]  montelukast (SINGULAIR) 10 MG tablet Take 1 tablet (10 mg total) by mouth at bedtime. Patient not taking: Reported on 07/02/2021 04/21/21   Laurey Morale, MD  promethazine (PHENERGAN) 25 MG tablet Take 1 tablet (25 mg total) by mouth every 8 (eight) hours as needed for nausea or vomiting. 05/06/20   Gatha Mayer, MD  sodium chloride (BRONCHO SALINE) inhaler solution Take 1 spray by nebulization as needed.    [provider]    Current Outpatient Medications  Medication Sig Dispense Refill   ALPRAZolam (XANAX) 0.5 MG tablet TAKE 1 TABLET(0.5 MG) BY MOUTH AT BEDTIME AS NEEDED FOR SLEEP 30 tablet 5   Blood Glucose Monitoring Suppl (ONETOUCH VERIO FLEX SYSTEM) w/Device KIT Use Onetouch Verio Flex to check blood sugar once daily. 1 kit 0   cetirizine (ZYRTEC) 10 MG tablet Take 10 mg by mouth daily.     clobetasol ointment (TEMOVATE) 8.75 % Apply 1 application topically 2 (two) times daily for 14 days. Thin vulvar application on affected vulva.  Progressively wean after 2 weeks. 30 g 2   Continuous Blood Gluc Sensor (FREESTYLE LIBRE 2 SENSOR) MISC 2 Devices by  Does not apply route every 14 (fourteen) days. 2 each 3   cyclobenzaprine (FLEXERIL) 10 MG tablet TAKE 1 TABLET BY MOUTH EVERY 8 HOURS AS NEEDED FOR MUSCLE SPASMS WITH ONE BEING AT BEDTIME IF NEEDED 60 tablet 5   Dulaglutide (TRULICITY) 1.5 IE/3.3IR SOPN Inject weekly 2 mL 1   EFFEXOR XR 75 MG 24 hr capsule TAKE 1 CAPSULE BY MOUTH TWICE DAILY WITH A MEAL 60 capsule 2   ezetimibe (ZETIA) 10 MG tablet TAKE 1 TABLET(10 MG) BY MOUTH DAILY 30 tablet 3   furosemide (LASIX) 20 MG tablet Take 20 mg by mouth daily.     furosemide (LASIX) 40 MG  tablet TAKE 1 TABLET(40 MG) BY MOUTH DAILY 90 tablet 3   Insulin Lispro Prot & Lispro (HUMALOG MIX 75/25 KWIKPEN) (75-25) 100 UNIT/ML Kwikpen Inject 20 Units into the skin daily before breakfast. Adjust dose as directed 15 mL 1   Insulin Pen Needle 31G X 5 MM MISC Use once a day with insulin pen 100 each 1   Lancets (ONETOUCH DELICA PLUS [id number]) MISC USE AS DIRECTED UP TO 4 TIMES DAILY AS DIRECTED 100 each 0   lansoprazole (PREVACID) 30 MG capsule TAKE 1 CAPSULE(30 MG) BY MOUTH DAILY 90 capsule 3   Multiple Vitamins-Minerals (HAIR SKIN AND NAILS FORMULA) TABS Take 1 tablet by mouth daily.     ONETOUCH VERIO test strip USE TO CHECK BLOOD SUGAR ONCE DAILY 100 strip 5   predniSONE (DELTASONE) 10 MG tablet Take 10 mg by mouth daily with breakfast.     AMBULATORY NON FORMULARY MEDICATION ACTIVE LIVER [place] leaf extract, Milk thistle, Turmeric Take 1 capsule by mouth once daily (Patient not taking: Reported on 07/02/2021)     aspirin 325 MG tablet Take 650-975 mg by mouth daily as needed for moderate pain or headache.      diphenhydrAMINE (BENADRYL) 25 mg capsule Take 25-50 mg by mouth daily as needed for allergies.      fluticasone-salmeterol (ADVAIR) 100-50 MCG/ACT AEPB Inhale 1 puff into the lungs 2 (two) times daily.     montelukast (SINGULAIR) 10 MG tablet Take 1 tablet (10 mg total) by mouth at bedtime. (Patient not taking: Reported on 07/02/2021) 90 tablet 3    promethazine (PHENERGAN) 25 MG tablet Take 1 tablet (25 mg total) by mouth every 8 (eight) hours as needed for nausea or vomiting. 30 tablet 1   sodium chloride (BRONCHO SALINE) inhaler solution Take 1 spray by nebulization as needed.     Current Facility-Administered Medications  Medication Dose Route Frequency Provider Last Rate Last Admin   0.9 %  sodium chloride infusion  500 mL Intravenous Once Gatha Mayer, MD        Allergies as of 07/06/2021 - Review Complete 07/06/2021  Allergen Reaction Noted   Fentanyl Anxiety 08/13/2016   Levofloxacin Anxiety 07/05/2011   Amoxicillin Diarrhea 08/19/2012   Augmentin [amoxicillin-pot clavulanate] Other (See Comments) 08/19/2012   Farxiga [dapagliflozin] Diarrhea 09/24/2015   Statins  05/12/2017    Family History  Problem Relation Age of Onset   Stroke Mother    Hypertension Mother    Colon cancer Father 27   Liver cancer Father 39   Colon polyps Father 54   Heart disease Sister        hole in heart   Pancreatic cancer Maternal Aunt    Brain cancer Maternal Uncle    Stroke Maternal Grandmother    Hypertension Maternal Grandmother    Esophageal cancer Maternal Grandfather    Throat cancer Maternal Grandfather    Breast cancer Paternal Grandmother    Brain cancer Paternal Grandmother    Lung cancer Paternal Grandfather    Rectal cancer Neg Hx    Stomach cancer Neg Hx     Social History   Socioeconomic History   Marital status: Divorced    Spouse name: Not on file   Number of children: 0   Years of education: Not on file   Highest education level: Associate degree: occupational, Hotel manager, or vocational program  Occupational History   Occupation: Airline pilot    Comment: [employer]  Tobacco Use   Smoking status: Never  Smokeless tobacco: Never  Vaping Use   Vaping Use: Never used  Substance and Sexual Activity   Alcohol use: Yes    Comment: champagne on weekends   Drug use: No   Sexual activity:  Yes    Partners: Male    Birth control/protection: Post-menopausal  Other Topics Concern   Not on file  Social History Narrative   Divorced, no children   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 [place] Strain: Low Risk    Difficulty of Paying Living Expenses: Not hard at all  Food Insecurity: Unknown   Worried About Charity fundraiser in the Last Year: Never true   Arboriculturist in the Last Year: Patient refused  Transportation Needs: No Transportation Needs   Lack of Transportation (Medical): No   Lack of Transportation (Non-Medical): No  Physical Activity: Insufficiently Active   Days of Exercise per Week: 3 days   Minutes of Exercise per Session: 20 min  Stress: No Stress Concern Present   Feeling of Stress : Only a little  Social Connections: Unknown   Frequency of Communication with Friends and Family: Once a week   Frequency of Social Gatherings with Friends and Family: Patient refused   Attends Religious Services: Never   Marine scientist or Organizations: No   Attends Music therapist: Not on file   Marital Status: Living with partner  Intimate Partner Violence: Not on file    Review of Systems:  All other review of systems negative except as mentioned in the HPI.  Physical Exam: Vital signs BP 113/73    Pulse 79    Temp 98.9 F (37.2 C)    Ht _0  (1.676 m)    Wt 132 lb (59.9 kg)    SpO2 98%    BMI 21.31 kg/m   General:   Alert,  Well-developed, well-nourished, pleasant and cooperative in NAD Lungs:  Clear throughout to auscultation.   Heart:  Regular rate and rhythm; no murmurs, clicks, rubs,  or gallops. Abdomen:  Soft, nontender and nondistended. Normal bowel sounds.   Neuro/Psych:  Alert and cooperative. Normal mood and affect. A and O x 3   _1  E. Carlean Purl, MD, [facility] [phone] (pager) 07/06/2021 10:39 AM@

## 2021-07-06 NOTE — Progress Notes (Signed)
VS by CW  Pt's states no medical or surgical changes since previsit or office visit.  

## 2021-07-06 NOTE — Patient Instructions (Addendum)
You do not have esophageal varices (swollen veins). 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 Most of the time this is not a major problem. It is probably what has been going on in past and there areas I cauterized are improved.  Keep seeing Roosevelt Locks, NP and I recommend you repeat this test in about 2 years.  I appreciate the opportunity to care for you. Gatha Mayer, MD, FACG  YOU HAD AN ENDOSCOPIC PROCEDURE TODAY AT [facility]:   Refer to the procedure report that was given to you for any specific questions about what was found during the examination.  If the procedure report does not answer your questions, please call your gastroenterologist to clarify.  If you requested that your care partner not be given the details of your procedure findings, then the procedure report has been included in a sealed envelope for you to review at your convenience later.  YOU SHOULD EXPECT: Some feelings of bloating in the abdomen. Passage of more gas than usual.  Walking can help get rid of the air that was put into your GI tract during the procedure and reduce the bloating. If you had a lower endoscopy (such as a colonoscopy or flexible sigmoidoscopy) you may notice spotting of blood in your stool or on the toilet paper. If you underwent a bowel prep for your procedure, you may not have a normal bowel movement for a few days.  Please Note:  You might notice some irritation and congestion in your nose or some drainage.  This is from the oxygen used during your procedure.  There is no need for concern and it should clear up in a day or so.  SYMPTOMS TO REPORT IMMEDIATELY:  Following upper endoscopy (EGD)  Vomiting of blood or coffee ground material  New chest pain or pain under the shoulder blades  Painful or persistently difficult swallowing  New shortness of  breath  Fever of 100F or higher  Black, tarry-looking stools  For urgent or emergent issues, a gastroenterologist can be reached at any hour by calling [phone]. Do not use MyChart messaging for urgent concerns.    DIET:  We do recommend a small meal at first, but then you may proceed to your regular diet.  Drink plenty of fluids but you should avoid alcoholic beverages for 24 hours.  ACTIVITY:  You should plan to take it easy for the rest of today and you should NOT DRIVE or use heavy machinery until tomorrow (because of the sedation medicines used during the test).    FOLLOW UP: Our staff will call the number listed on your records 48-72 hours following your procedure to check on you and address any questions or concerns that you may have regarding the information given to you following your procedure. If we do not reach you, we will leave a message.  We will attempt to reach you two times.  During this call, we will ask if you have developed any symptoms of COVID 19. If you develop any symptoms (ie: fever, flu-like symptoms, shortness of breath, cough etc.) before then, please call [phone].  If you test positive for Covid 19 in the 2 weeks post procedure, please call and report this information to [place].    If any biopsies were taken you will be contacted by phone or by letter within the  next 1-3 weeks.  Please call us at [phone] if you have not heard about the biopsies in 3 weeks.    SIGNATURES/CONFIDENTIALITY: You and/or your care partner have signed paperwork which will be entered into your electronic medical record.  These signatures attest to the fact that that the information above on your After Visit Summary has been reviewed and is understood.  Full responsibility of the confidentiality of this discharge information lies with you and/or your care-partner.

## 2021-07-06 NOTE — Op Note (Signed)
[facility] Patient Name: Miranda Cochran Procedure Date: 07/06/2021 10:40 AM MRN: [id number] Endoscopist: Gatha Mayer , MD Age: 71 Referring MD:  Date of Birth: 09-Nov-1950 Gender: Female Account #: [id number] Procedure:                Upper GI endoscopy Indications:              Cirrhosis rule out esophageal varices Medicines:                Propofol per Anesthesia, Monitored Anesthesia Care Procedure:                Pre-Anesthesia Assessment:                           - Prior to the procedure, a History and Physical                            was performed, and patient medications and                            allergies were reviewed. The patient's tolerance of                            previous anesthesia was also reviewed. The risks                            and benefits of the procedure and the sedation                            options and risks were discussed with the patient.                            All questions were answered, and informed consent                            was obtained. Prior Anticoagulants: The patient has                            taken no previous anticoagulant or antiplatelet                            agents. ASA Grade Assessment: II - A patient with                            mild systemic disease. After reviewing the risks                            and benefits, the patient was deemed in                            satisfactory condition to undergo the procedure.                           After obtaining informed consent, the endoscope was  passed under direct vision. Throughout the                            procedure, the patient's blood pressure, pulse, and                            oxygen saturations were monitored continuously. The                            Olympus [id number] was introduced through                            the mouth, and advanced to the second part of                             duodenum. The upper GI endoscopy was accomplished                            without difficulty. The patient tolerated the                            procedure well. Scope In: Scope Out: Findings:                 The examined esophagus was normal.                           Moderate portal hypertensive gastropathy was found                            in the entire examined stomach.                           The cardia and gastric fundus were normal on                            retroflexion.                           The examined duodenum was normal. Complications:            No immediate complications. Estimated Blood Loss:     Estimated blood loss: none. Impression:               - Normal esophagus.                           - Portal hypertensive gastropathy.                           - Normal examined duodenum.                           - No specimens collected. Recommendation:           - Patient has a contact number available for  emergencies. The signs and symptoms of potential                            delayed complications were discussed with the                            patient. Return to normal activities tomorrow.                            Written discharge instructions were provided to the                            patient.                           - Resume previous diet.                           - Continue present medications.                           - Repeat upper endoscopy in 2 years for screening                            purposes.                           - cc Roosevelt Locks, NP - please fax Gatha Mayer, MD 07/06/2021 11:01:42 AM This report has been signed electronically.

## 2021-07-07 MED ORDER — VENLAFAXINE HCL ER 75 MG PO CP24: ORAL_CAPSULE | ORAL | 0 refills | Status: DC

## 2021-07-07 NOTE — Telephone Encounter (Signed)
Miranda Cochran is willing to try the generic Effexor XR since brand is not covered. Sent in to [facility].

## 2021-07-08 ENCOUNTER — Telehealth: Payer: Self-pay | Admitting: *Deleted

## 2021-07-08 NOTE — Telephone Encounter (Signed)
°  Follow up Call-  Call back number 07/06/2021 07/18/2020  Post procedure Call Back phone  # [phone]  Permission to leave phone message Yes Yes  Some recent data might be hidden     Patient questions:  Do you have a fever, pain , or abdominal swelling? No. Pain Score  0 *  Have you tolerated food without any problems? Yes.    Have you been able to return to your normal activities? Yes.    Do you have any questions about your discharge instructions: Diet   No. Medications  No. Follow up visit  No.  Do you have questions or concerns about your Care? No.  Actions: * If pain score is 4 or above: No action needed, pain <4.  Have you developed a fever since your procedure? no  2.   Have you had an respiratory symptoms (SOB or cough) since your procedure? no  3.   Have you tested positive for COVID 19 since your procedure no  4.   Have you had any family members/close contacts diagnosed with the COVID 19 since your procedure?  no   If yes to any of these questions please route to Joylene John, RN and Joella Prince, RN

## 2021-07-09 ENCOUNTER — Other Ambulatory Visit: Payer: Self-pay

## 2021-07-09 MED ORDER — SULFAMETHOXAZOLE-TRIMETHOPRIM 800-160 MG PO TABS: 1.0000 | ORAL_TABLET | Freq: Two times a day (BID) | ORAL | 0 refills | Status: DC

## 2021-07-10 ENCOUNTER — Other Ambulatory Visit: Payer: Self-pay

## 2021-07-10 ENCOUNTER — Other Ambulatory Visit (HOSPITAL_COMMUNITY)
Admission: RE | Admit: 2021-07-10 | Discharge: 2021-07-10 | Disposition: A | Discharge status: Home / Self Care | Payer: 59 | Source: Ambulatory Visit | Attending: Obstetrics & Gynecology | Admitting: Obstetrics & Gynecology

## 2021-07-10 ENCOUNTER — Encounter: Payer: Self-pay | Admitting: Obstetrics & Gynecology

## 2021-07-10 ENCOUNTER — Ambulatory Visit: Payer: 59 | Admitting: Obstetrics & Gynecology

## 2021-07-10 VITALS — BP 116/70

## 2021-07-10 DIAGNOSIS — R87620 Atypical squamous cells of undetermined significance on cytologic smear of vagina (ASC-US): Secondary | ICD-10-CM

## 2021-07-10 DIAGNOSIS — R87810 Cervical high risk human papillomavirus (HPV) DNA test positive: Secondary | ICD-10-CM

## 2021-07-10 DIAGNOSIS — R87610 Atypical squamous cells of undetermined significance on cytologic smear of cervix (ASC-US): Secondary | ICD-10-CM | POA: Insufficient documentation

## 2021-07-10 NOTE — Progress Notes (Signed)
° ° °  Miranda Cochran [birth year] [id number]        70 y.o.  G0P0000   RP: ASCUS/HPV HR Pos for Colposcopy  HPI: ASCUS/HPV HR Pos in 05/2021.  Had HPV HR Pos, but Pap Neg in 2021.   OB History  Gravida Para Term Preterm AB Living  0 0 0 0 0 0  SAB IAB Ectopic Multiple Live Births  0 0 0 0 0    Past medical history,surgical history, problem list, medications, allergies, family history and social history were all reviewed and documented in the EPIC chart.   Directed ROS with pertinent positives and negatives documented in the history of present illness/assessment and plan.  Exam:  Vitals:   07/10/21 1002  BP: 116/70   General appearance:  Normal Colposcopy Procedure Note Miranda Cochran 07/10/2021  Indications: ASCUS/HPV HR Pos  Procedure Details  The risks and benefits of the procedure and Written informed consent obtained.  Speculum placed in vagina and excellent visualization of cervix achieved, cervix swabbed x 3 with acetic acid solution.  Findings:  Cervix colposcopy: Physical Exam Genitourinary:       Vaginal colposcopy: Normal  Vulvar colposcopy: Normal  Perirectal colposcopy: Normal  The cervix was sprayed with Hurricane before performing the cervical biopsies.  Specimens: HPV 16-18-45.  Cervical Bxs at 4 and 7 O'Clock.  Complications:  No Cx, good hemostasis with Silver Nitrate. . Plan: Management per results.   Assessment/Plan:  71 y.o. G0  1. ASCUS with positive high risk HPV cervical ASCUS/HPV HR Pos in 05/2021.  Had HPV HR Pos, but Pap Neg in 2021.  Counseling on ASCUS/HPV HR done.  Colposcopy procedure explained.  Colpo findings reviewed with patient.  Post procedure precautions.  Management per results. - Cervicovaginal ancillary only( [place]) - Surgical pathology( [facility]/ [facility])   Miranda Bruins MD, 10:08 AM 07/10/2021

## 2021-07-14 LAB — CERVICOVAGINAL ANCILLARY ONLY
Comment: NEGATIVE
HPV 16: NEGATIVE
HPV 18 / 45: NEGATIVE

## 2021-07-14 LAB — SURGICAL PATHOLOGY

## 2021-07-22 ENCOUNTER — Other Ambulatory Visit: Payer: Self-pay | Admitting: Internal Medicine

## 2021-07-22 DIAGNOSIS — R52 Pain, unspecified: Secondary | ICD-10-CM

## 2021-08-05 ENCOUNTER — Encounter: Payer: 59 | Attending: Endocrinology | Admitting: Nutrition

## 2021-08-05 ENCOUNTER — Encounter: Payer: Self-pay | Admitting: Endocrinology

## 2021-08-05 ENCOUNTER — Ambulatory Visit: Payer: 59 | Admitting: Endocrinology

## 2021-08-05 ENCOUNTER — Other Ambulatory Visit: Payer: Self-pay

## 2021-08-05 VITALS — BP 124/82 | HR 89 | Ht 66.0 in | Wt 137.0 lb

## 2021-08-05 DIAGNOSIS — E7800 Pure hypercholesterolemia, unspecified: Secondary | ICD-10-CM | POA: Diagnosis not present

## 2021-08-05 DIAGNOSIS — E1165 Type 2 diabetes mellitus with hyperglycemia: Secondary | ICD-10-CM | POA: Diagnosis present

## 2021-08-05 LAB — POCT GLYCOSYLATED HEMOGLOBIN (HGB A1C): Hemoglobin A1C: 9 % — AB (ref 4.0–5.6)

## 2021-08-05 NOTE — Progress Notes (Signed)
[name], female   DOB: [birth year], 71 y.o.   MRN: [id number]            Reason for Appointment: Follow-up for Type 2 Diabetes   History of Present Illness:          Date of diagnosis of type 2 diabetes mellitus: ?  2013        Background history:   She had a blood sugar over 200 in 2013 but was not told to have diabetes until 04/2015 A1c was 7.3 in 3/16 In 04/2015 blood sugar was 478 says her blood sugar was 600 when she was diagnosed and was not having any significant symptoms Initially treated with Metaglip but this apparently caused abdominal discomfort and she was switched to metformin In 2017 she had been tried on Bydureon for better control but she stopped this after a few weeks because of large persisted nodules in her skin She thinks her blood sugars were better with this and A1c was down to 6.8  Recent history:   Non-insulin hypoglycemic drugs the patient is taking are: Trulicity 1.5 mg weekly Insulin regimen: HUMALOG mix 75/25, 22 units every morning  Her A1c is persistently high and now 9%   Fructosamine is last at 346   Current management, blood sugar patterns and problems identified:   She has been started on insulin in 1/23 because of marked hyperglycemia especially when being off Trulicity due to nonavailability Highest blood sugar prior to starting insulin was 485 in the afternoon  Her Trulicity has been resumed at 1.5 mg weekly and not 3 mg Although she was told to take her insulin prior to her first meal around noontime she is taking it 3 hours before eating She has progressively increased the dose and now taking 22 units, was recommended on the 10 units to start However her blood sugar monitoring has been sporadic She says she does have the freestyle libre available but she did not start it Although her blood sugars appear to be improving about 2 to 3 weeks ago they are mostly over 200 again including fasting Last night her glucose was  259 at bedtime She says that she is eating relatively small portions, only very rarely drinking regular soft drinks However may be getting more carbohydrates like popcorn at times        Side effects from medications have been: Metaglip caused abdominal pain, Tradjenta caused swelling of the legs, Bydureon caused skin nodules, Farxiga caused diarrhea, glipizide causes dizziness and weakness, Metformin caused GI side effects, Invokana causes yeast infections   Glucose monitoring:  done less than 1 times a day         Glucometer: One Verio     Blood Glucose readings by time of day by monitor download:   PRE-MEAL Fasting Lunch Dinner Bedtime Overall  Glucose range: 109-207  192    Mean/median: 180    209   POST-MEAL PC Breakfast PC Lunch PC Dinner  Glucose range:  ?  311 259, 304  Mean/median:      Previously:   PRE-MEAL Fasting Lunch Dinner Bedtime Overall  Glucose range: 135-339  382-485  135-485  Mean/median: 250  434  297    Self-care:  Typical meal intake: Breakfast is Oatmeal, usually eating low-fat dinner and her snacks will be fruit, peanut butter crackers                Dietician visit, most recent: None  Weight history:  Wt Readings from Last 3 Encounters:  08/05/21 137 lb (62.1 kg)  07/06/21 132 lb (59.9 kg)  06/30/21 131 lb 12.8 oz (59.8 kg)    Glycemic control:   Lab Results  Component Value Date   HGBA1C 9.0 (A) 08/05/2021   HGBA1C 7.3 (H) 03/13/2021   HGBA1C 7.2 (H) 11/18/2020   Lab Results  Component Value Date   MICROALBUR 2.2 (H) 07/14/2020   LDLCALC 144 (H) 03/13/2021   CREATININE 0.61 05/15/2021   Lab Results  Component Value Date   MICRALBCREAT 10.2 07/14/2020    Lab Results  Component Value Date   FRUCTOSAMINE 346 (H) 05/15/2021   FRUCTOSAMINE 386 (H) 03/13/2021   FRUCTOSAMINE 381 (H) 01/28/2021    Office Visit on 08/05/2021  Component Date Value Ref Range Status   Hemoglobin A1C 08/05/2021 9.0 (A)  4.0 - 5.6 %  Final     Allergies as of 08/05/2021       Reactions   Fentanyl Anxiety   Made her feel very anxious, requests not to get it.   Levofloxacin Anxiety   Weird dreams    Amoxicillin Diarrhea   Has patient had a PCN reaction causing immediate rash, facial/tongue/throat swelling, SOB or lightheadedness with hypotension: No Has patient had a PCN reaction causing severe rash involving mucus membranes or skin necrosis: No Has patient had a PCN reaction that required hospitalization: No Has patient had a PCN reaction occurring within the last 10 years: Yes If all of the above answers are "NO", then may proceed with Cephalosporin use.   Augmentin [amoxicillin-pot Clavulanate] Other (See Comments)   diarrhea   Farxiga [dapagliflozin] Diarrhea   Gi pain   Statins    Myalgia         Medication List        Accurate as of August 05, 2021 11:50 AM. If you have any questions, ask your nurse or doctor.          ALPRAZolam 0.5 MG tablet Commonly known as: XANAX TAKE 1 TABLET(0.5 MG) BY MOUTH AT BEDTIME AS NEEDED FOR SLEEP   AMBULATORY NON FORMULARY MEDICATION ACTIVE LIVER Artichoke leaf extract, Milk thistle, Turmeric Take 1 capsule by mouth once daily   aspirin 325 MG tablet Take 650-975 mg by mouth daily as needed for moderate pain or headache.   cetirizine 10 MG tablet Commonly known as: ZYRTEC Take 10 mg by mouth daily.   cyclobenzaprine 10 MG tablet Commonly known as: FLEXERIL TAKE 1 TABLET BY MOUTH EVERY 8 HOURS AS NEEDED FOR MUSCLE SPASMS WITH ONE BEING AT BEDTIME IF NEEDED   diphenhydrAMINE 25 mg capsule Commonly known as: BENADRYL Take 25-50 mg by mouth daily as needed for allergies.   ezetimibe 10 MG tablet Commonly known as: ZETIA TAKE 1 TABLET(10 MG) BY MOUTH DAILY   fluticasone-salmeterol 100-50 MCG/ACT Aepb Commonly known as: ADVAIR Inhale 1 puff into the lungs 2 (two) times daily.   FreeStyle Libre 2 Sensor Misc 2 Devices by Does not apply route  every 14 (fourteen) days.   furosemide 40 MG tablet Commonly known as: LASIX TAKE 1 TABLET(40 MG) BY MOUTH DAILY   furosemide 20 MG tablet Commonly known as: LASIX Take 20 mg by mouth daily.   Hair Skin and Nails Formula Tabs Take 1 tablet by mouth daily.   Insulin Lispro Prot & Lispro (75-25) 100 UNIT/ML Kwikpen Commonly known as: HumaLOG Mix 75/25 KwikPen Inject 20 Units into the skin daily before breakfast. Adjust dose as directed  What changed: how much to take   Insulin Pen Needle 31G X 5 MM Misc Use once a day with insulin pen   lansoprazole 30 MG capsule Commonly known as: PREVACID TAKE 1 CAPSULE(30 MG) BY MOUTH DAILY   montelukast 10 MG tablet Commonly known as: SINGULAIR Take 1 tablet (10 mg total) by mouth at bedtime.   OneTouch Delica Plus [id number] Misc USE AS DIRECTED UP TO 4 TIMES DAILY AS DIRECTED   OneTouch Verio Flex System w/Device Kit Use Onetouch Verio Flex to check blood sugar once daily.   OneTouch Verio test strip Generic drug: glucose blood USE TO CHECK BLOOD SUGAR ONCE DAILY   predniSONE 10 MG tablet Commonly known as: DELTASONE Take 10 mg by mouth daily with breakfast.   promethazine 25 MG tablet Commonly known as: PHENERGAN Take 1 tablet (25 mg total) by mouth every 8 (eight) hours as needed for nausea or vomiting.   sodium chloride inhaler solution Commonly known as: BRONCHO SALINE Take 1 spray by nebulization as needed.   sulfamethoxazole-trimethoprim 800-160 MG tablet Commonly known as: BACTRIM DS Take 1 tablet by mouth 2 (two) times daily.   Trulicity 1.5 XL/2.4MW Sopn Generic drug: Dulaglutide Inject weekly   venlafaxine XR 75 MG 24 hr capsule Commonly known as: EFFEXOR-XR TAKE 1 CAPSULE BY MOUTH TWICE DAILY WITH A MEAL        Allergies:  Allergies  Allergen Reactions   Fentanyl Anxiety    Made her feel very anxious, requests not to get it.   Levofloxacin Anxiety    Weird dreams    Amoxicillin Diarrhea    Has  patient had a PCN reaction causing immediate rash, facial/tongue/throat swelling, SOB or lightheadedness with hypotension: No Has patient had a PCN reaction causing severe rash involving mucus membranes or skin necrosis: No Has patient had a PCN reaction that required hospitalization: No Has patient had a PCN reaction occurring within the last 10 years: Yes If all of the above answers are "NO", then may proceed with Cephalosporin use.    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   GERD (gastroesophageal reflux disease)    on meds   Hiatal hernia    Hx of adenomatous polyp of colon 07/07/2009   Hyperlipidemia    diet controlled   Hypertension    on meds - LASIX   IBS (irritable bowel syndrome)    Iron deficiency anemia    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 Carlean Purl, adenimatous polyps, repeat in 7 yr   ESOPHAGOGASTRODUODENOSCOPY  02/2002   ESOPHAGOGASTRODUODENOSCOPY (EGD) WITH PROPOFOL N/A 06/20/2017   Procedure: ESOPHAGOGASTRODUODENOSCOPY (EGD) WITH PROPOFOL;  Surgeon: Gatha Mayer, MD;   Location: [facility];  Service: Endoscopy;  Laterality: N/A;  with Barrx   EYE SURGERY     NASAL SEPTOPLASTY W/ TURBINOPLASTY     POLYPECTOMY  2015   CG-TA   TONSILLECTOMY      Family History  Problem Relation Age of Onset   Stroke Mother    Hypertension Mother    Colon cancer Father 24   Liver cancer Father 22   Colon polyps Father 43   Heart disease Sister        hole in heart   Pancreatic cancer Maternal Aunt    Brain cancer Maternal Uncle    Stroke Maternal Grandmother    Hypertension Maternal Grandmother    Esophageal cancer Maternal Grandfather    Throat cancer Maternal Grandfather    Breast cancer Paternal Grandmother    Brain cancer Paternal Grandmother    Lung cancer Paternal Grandfather    Rectal cancer Neg Hx    Stomach cancer Neg Hx     Social History:  reports that she has never smoked. She has never used smokeless tobacco. She reports current alcohol use. She reports that she does not use drugs.   Review of Systems   Lipid history: She has persistent hypercholesterolemia Highest baseline LDL about 170  Previously had tried Zocor which apparently caused muscle aches, she took Crestor and she thinks generic causes muscle cramps She took Livalo for a couple of days but she thinks she didn't feel well with this and had some palpitations reportedly  She has been prescribed Zetia but appears to be irregular with this  LDL has been as low as 97 when she was taking this   Lab Results  Component Value Date   CHOL 235 (H) 03/13/2021   CHOL 163 07/14/2020   CHOL 236 (H) 08/01/2019   Lab Results  Component Value Date   HDL 61.80 03/13/2021   HDL 41.50 07/14/2020   HDL 59.40 08/01/2019   Lab Results  Component Value Date   LDLCALC 144 (H) 03/13/2021   LDLCALC 97 07/14/2020   LDLCALC 156 (H) 08/01/2019   Lab Results  Component Value Date   TRIG 149.0 03/13/2021   TRIG 120.0 07/14/2020   TRIG 104.0 08/01/2019   Lab Results  Component Value Date    CHOLHDL 4 03/13/2021   CHOLHDL 4 07/14/2020   CHOLHDL 4 08/01/2019   Lab Results  Component Value Date   LDLDIRECT 158.0 05/15/2021   LDLDIRECT 153.0 02/27/2019           Lab Results  Component Value Date   ALT 23 03/13/2021    Hypertension: She states she has whitecoat syndrome causing high readings Her blood pressure readings are generally normal at home Has not compared home monitor to the office reading   BP Readings from Last 3 Encounters:  08/05/21 124/82  07/10/21 116/70  07/06/21 134/70    She takes Lasix  for complaints of having swelling in her abdomen Potassium has previously been normal but again it is low, she is also on prednisone She refuses to take potassium supplements  Lab Results  Component Value Date   K 3.4 (L) 05/15/2021     Most recent eye exam was 10/20  Most recent foot exam: 2/22  Has had normal thyroid levels previously, benign thyroid nodule in 2012 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  Lab Results  Component Value Date   TSH 2.76 11/18/2020     LABS:  Office Visit on 08/05/2021  Component Date Value Ref Range Status   Hemoglobin A1C 08/05/2021 9.0 (A)  4.0 - 5.6 % Final    Physical Examination:  BP 124/82    Pulse 89    Ht $R'5\' 6"'ip$  (1.676 m)    Wt 137 lb (62.1 kg)    SpO2 97%    BMI 22.11 kg/m       ASSESSMENT:  Diabetes type 2, nonobese  See history of present illness for detailed discussion of current diabetes management, blood sugar patterns and problems identified  A1c has been over 9%  Currently on once a day premixed insulin and Trulicity  Most of her hyperglycemia is likely related to prednisone However she also appears to be more insulin deficient now with needing insulin Although she likely will do better with a separate basal and bolus insulin regimen she is reluctant to take multiple injections With fasting readings being higher recently again she will likely need twice daily  insulin instead of just in the morning However she does appear to be still hyperglycemic midday even when she takes her insulin 3 hours before eating because of prednisone  Again discussed in detail the differences between basal and bolus insulin and the nature of her premixed insulin as well as preferred timing of this  PLAN:     Because of lack of adequate glucose monitoring she needs to be on the freestyle libre sensor She will be given a trial of the [place] 3 sensor and if this is covered she can start using this, likely will not monitor her blood sugar enough with the [place] 2 sensor that she has She will be instructed by the nurse educator on how to start the sensor and app will be downloaded on her phone  She will increase her morning insulin by 4 units for now and start 10 units before supper She will go up 2 units weekly on each of the doses to get the fasting and suppertime readings below 150, detailed instructions discussed Also if she starts getting low sugars overnight she will let us know Recommended taking her morning insulin about half hour before eating especially when fasting readings are near normal  Continue Trulicity 1.5, will not increase and she has some decrease in appetite Encouraged her to have balanced meals with some protein and avoid excess carbohydrates with snacks Regular exercise   Patient Instructions  Take 26 units insulin and 10 before supper  Go up 2 units on am dose if supper sugar > 150  Go up 2 units on pm  dose if am sugar > 150       Total visit time including counseling = 30 minutes    Elayne Snare 08/05/2021, 11:50 AM   Note: This office note was prepared with Estate agent. Any transcriptional errors that result from this process are unintentional.

## 2021-08-05 NOTE — Patient Instructions (Signed)
Take 26 units insulin and 10 before supper  Go up 2 units on am dose if supper sugar > 150  Go up 2 units on pm  dose if am sugar > 150

## 2021-08-06 ENCOUNTER — Other Ambulatory Visit: Payer: Self-pay | Admitting: Endocrinology

## 2021-08-06 DIAGNOSIS — E1165 Type 2 diabetes mellitus with hyperglycemia: Secondary | ICD-10-CM

## 2021-08-10 ENCOUNTER — Other Ambulatory Visit: Payer: Self-pay

## 2021-08-13 ENCOUNTER — Ambulatory Visit: Payer: 59 | Admitting: Obstetrics & Gynecology

## 2021-08-13 ENCOUNTER — Telehealth: Payer: Self-pay

## 2021-08-13 DIAGNOSIS — E1165 Type 2 diabetes mellitus with hyperglycemia: Secondary | ICD-10-CM

## 2021-08-13 MED ORDER — INSULIN LISPRO PROT & LISPRO (75-25 MIX) 100 UNIT/ML KWIKPEN: 20.0000 [IU] | PEN_INJECTOR | Freq: Every day | SUBCUTANEOUS | 1 refills | Status: DC

## 2021-08-13 NOTE — Telephone Encounter (Signed)
Patient left voicemail that even with the increase of insulin her blood sugarars are still ranging from 200-300. She is using about 38 units a day of humalog and needs new Rx sent because she is using more. Please advise ?

## 2021-08-13 NOTE — Telephone Encounter (Signed)
She will need to increase her morning dose by 10 units and if her fasting blood sugar is over 150 increase her suppertime dose by 4 units, okay to send new prescription ?

## 2021-08-16 NOTE — Patient Instructions (Signed)
Scan sensor first thing in the morning, before meals and at bedtime Adjust PM dose of insulin by 2u every 3 days until the morning blood sugar is below 150.   Adjust the morning insulin dose by 2u ever 3 days until the before supper is less than 150. Call if questions.

## 2021-08-16 NOTE — Progress Notes (Signed)
Patient was trained on the use of the [place] 3.  This was linked to her phone and then to [place].   We discussed the importance of scanning the sensor q 8 hours: waking, acL acS and HS to get full 24 hour view on needed readingsl Also discussed the importance of adjusting insulin doses.  We review the orders from Dr Dwyane Dee on how to do this.  She reported good understanding of this.  She had no final questions.

## 2021-08-17 ENCOUNTER — Other Ambulatory Visit: Payer: Self-pay

## 2021-08-17 ENCOUNTER — Telehealth: Payer: Self-pay | Admitting: Nutrition

## 2021-08-17 DIAGNOSIS — E1165 Type 2 diabetes mellitus with hyperglycemia: Secondary | ICD-10-CM

## 2021-08-17 MED ORDER — INSULIN LISPRO PROT & LISPRO (75-25 MIX) 100 UNIT/ML KWIKPEN: PEN_INJECTOR | SUBCUTANEOUS | 1 refills | Status: DC

## 2021-08-17 MED ORDER — FREESTYLE LIBRE 3 SENSOR MISC: 1.0000 | 2 refills | Status: DC

## 2021-08-17 NOTE — Telephone Encounter (Signed)
Patient reports that [facility] on [address] needs a prescription for the [place] 3 sensors-1Q 14 days..  Please send this over today, as her sensor is ending tomorrow.  Thank you ?

## 2021-08-17 NOTE — Telephone Encounter (Signed)
Rx sent to pharmacy   

## 2021-08-17 NOTE — Telephone Encounter (Signed)
Spoke with patient today. States she tried to get Rx on Saturday but per insurance its too early. She will pick up today. ?

## 2021-08-25 ENCOUNTER — Other Ambulatory Visit: Payer: Self-pay

## 2021-08-25 DIAGNOSIS — E7800 Pure hypercholesterolemia, unspecified: Secondary | ICD-10-CM

## 2021-08-25 MED ORDER — EZETIMIBE 10 MG PO TABS: ORAL_TABLET | ORAL | 3 refills | Status: DC

## 2021-08-27 ENCOUNTER — Other Ambulatory Visit: Payer: Self-pay | Admitting: Endocrinology

## 2021-09-01 ENCOUNTER — Other Ambulatory Visit: Payer: Self-pay

## 2021-09-01 ENCOUNTER — Other Ambulatory Visit: Payer: Self-pay | Admitting: Family Medicine

## 2021-09-01 DIAGNOSIS — E1165 Type 2 diabetes mellitus with hyperglycemia: Secondary | ICD-10-CM

## 2021-09-01 DIAGNOSIS — Z1231 Encounter for screening mammogram for malignant neoplasm of breast: Secondary | ICD-10-CM

## 2021-09-01 MED ORDER — INSULIN PEN NEEDLE 31G X 5 MM MISC: 1 refills | Status: DC

## 2021-09-09 ENCOUNTER — Other Ambulatory Visit: Payer: Self-pay | Admitting: Endocrinology

## 2021-09-09 ENCOUNTER — Other Ambulatory Visit: Payer: Self-pay

## 2021-09-09 ENCOUNTER — Ambulatory Visit
Admission: RE | Admit: 2021-09-09 | Discharge: 2021-09-09 | Disposition: A | Discharge status: Home / Self Care | Payer: 59 | Source: Ambulatory Visit | Attending: Family Medicine | Admitting: Family Medicine

## 2021-09-09 ENCOUNTER — Other Ambulatory Visit: Payer: Self-pay | Admitting: Internal Medicine

## 2021-09-09 DIAGNOSIS — Z1231 Encounter for screening mammogram for malignant neoplasm of breast: Secondary | ICD-10-CM

## 2021-09-09 DIAGNOSIS — R52 Pain, unspecified: Secondary | ICD-10-CM

## 2021-09-15 ENCOUNTER — Other Ambulatory Visit (INDEPENDENT_AMBULATORY_CARE_PROVIDER_SITE_OTHER): Payer: 59

## 2021-09-15 ENCOUNTER — Other Ambulatory Visit (HOSPITAL_COMMUNITY): Payer: Self-pay | Admitting: Nurse Practitioner

## 2021-09-15 ENCOUNTER — Other Ambulatory Visit: Payer: Self-pay | Admitting: Nurse Practitioner

## 2021-09-15 DIAGNOSIS — E7800 Pure hypercholesterolemia, unspecified: Secondary | ICD-10-CM | POA: Diagnosis not present

## 2021-09-15 DIAGNOSIS — E1165 Type 2 diabetes mellitus with hyperglycemia: Secondary | ICD-10-CM

## 2021-09-15 DIAGNOSIS — K7469 Other cirrhosis of liver: Secondary | ICD-10-CM

## 2021-09-15 LAB — GLUCOSE, RANDOM: Glucose, Bld: 85 mg/dL (ref 70–99)

## 2021-09-15 LAB — LDL CHOLESTEROL, DIRECT: Direct LDL: 133 mg/dL

## 2021-09-16 ENCOUNTER — Ambulatory Visit: Payer: 59 | Admitting: Endocrinology

## 2021-09-16 VITALS — BP 142/72 | HR 89 | Wt 140.4 lb

## 2021-09-16 DIAGNOSIS — Z794 Long term (current) use of insulin: Secondary | ICD-10-CM | POA: Diagnosis not present

## 2021-09-16 DIAGNOSIS — E7800 Pure hypercholesterolemia, unspecified: Secondary | ICD-10-CM

## 2021-09-16 DIAGNOSIS — E1165 Type 2 diabetes mellitus with hyperglycemia: Secondary | ICD-10-CM

## 2021-09-16 LAB — FRUCTOSAMINE: Fructosamine: 274 umol/L (ref 0–285)

## 2021-09-16 MED ORDER — INSULIN LISPRO PROT & LISPRO (75-25 MIX) 100 UNIT/ML KWIKPEN: PEN_INJECTOR | SUBCUTANEOUS | 1 refills | Status: DC

## 2021-09-16 NOTE — Patient Instructions (Signed)
Take am shot at work take 34 units  ? ?Go up 2 units weekly till supper sugar <150 ? ?Take 10 units at supper plus 2-4 for sugars >180 ?

## 2021-09-16 NOTE — Progress Notes (Signed)
[name], female   DOB: 10-30-1950, 71 y.o.   MRN: [id number]  ? ?       ? ? ?Reason for Appointment: Follow-up for Type 2 Diabetes ? ? ?History of Present Illness:  ?        ?Date of diagnosis of type 2 diabetes mellitus: ?  2013       ? ?Background history:  ? ?She had a blood sugar over 200 in 2013 but was not told to have diabetes until 04/2015 ?A1c was 7.3 in 3/16 ?In 04/2015 blood sugar was 478 says her blood sugar was 600 when she was diagnosed and was not having any significant symptoms ?Initially treated with Metaglip but this apparently caused abdominal discomfort and she was switched to metformin ?In 2017 she had been tried on Bydureon for better control but she stopped this after a few weeks because of large persisted nodules in her skin ?She thinks her blood sugars were better with this and A1c was down to 6.8 ? ?Recent history:  ? ?Non-insulin hypoglycemic drugs the patient is taking are: Trulicity 3 mg weekly ?Insulin regimen: HUMALOG mix 75/25, 28 units every morning--12-14 ? ?Her A1c is persistently high and last 9%  ? ?Fructosamine is normal at 274, previously was at 346  ? ?Current management, blood sugar patterns and problems identified: ? ? ?She has increased her morning insulin as directed on the last visit, previously taking 22 units ?Also has been started on 12 units before dinnertime  ?She still continues to be on 10 mg prednisone in the morning ?This appears to be long-term treatment  ?However even with increasing her insulin and Trulicity her blood sugars are averaging about 250 late afternoon  ?Her main meal is in the evening but she will also eat a meal like a sandwich at lunchtime after she gets to work around 1 PM, however occasionally will only eat popcorn ?Currently taking her morning insulin BEFORE she leaves to go to work and occasionally will have a low sugar before she starts eating, this happened last Sunday also from taking the insulin out of time  ?However  overall she has been able to monitor her blood sugars more closely with using the libre 3 sensor, recently this has been active 97% of the time  ?As before is not doing a lot of formal exercise  ?She has had rare episodes of low blood sugars and was above including late morning but not overnight ? ?      ?Side effects from medications have been: Metaglip caused abdominal pain, Tradjenta caused swelling of the legs, Bydureon caused skin nodules, Farxiga caused diarrhea, glipizide causes dizziness and weakness, Metformin caused GI side effects, Invokana causes yeast infections ? ?Libre 3 data as follows: ?GMI 8% ? ?Summary of patterns: Blood sugars lowest in the morning hours around 9 AM and highest around 6 PM ?Generally blood sugars are gradually decreasing through the night and then progressively rise after late morning until at least 6 PM ?Significant variability is present in the afternoons and evenings ?HYPERGLYCEMIC episodes are generally occurring in the afternoons and continuing to variable extent in the evenings with occasional high sugars running late into the night ?Hypoglycemia not present generally but had rare episodes late morning or right at noon ?POSTPRANDIAL readings are generally increasing after lunch to as much as 350 and occasionally after dinner also ? ? ?CGM use % of time 97  ?2-week average/GV 198 35  ?Time in range 43     %  ?%  Time Above 180 34  ?% Time above 250 22  ?% Time Below 70 1  ? ?  ?PRE-MEAL Fasting Lunch Dinner Bedtime Overall  ?Glucose range:       ?Averages: 142  253  198  ? ?POST-MEAL PC Breakfast PC Lunch PC Dinner  ?Glucose range:     ?Averages:  235 221  ? ? ?Previously: ? ?PRE-MEAL Fasting Lunch Dinner Bedtime Overall  ?Glucose range: 109-207  192    ?Mean/median: 180    209  ? ?POST-MEAL PC Breakfast PC Lunch PC Dinner  ?Glucose range:  ?  311 259, 304  ?Mean/median:     ? ? ? ?Self-care:  ?Typical meal intake: Breakfast is Oatmeal, usually eating low-fat dinner and her  snacks will be fruit, peanut butter crackers ?               ?Dietician visit, most recent: None ?              ?Weight history: ? ?Wt Readings from Last 3 Encounters:  ?09/16/21 140 lb 6.4 oz (63.7 kg)  ?08/05/21 137 lb (62.1 kg)  ?07/06/21 132 lb (59.9 kg)  ? ? ?Glycemic control: ?  ?Lab Results  ?Component Value Date  ? HGBA1C 9.0 (A) 08/05/2021  ? HGBA1C 7.3 (H) 03/13/2021  ? HGBA1C 7.2 (H) 11/18/2020  ? ?Lab Results  ?Component Value Date  ? MICROALBUR 2.2 (H) 07/14/2020  ? LDLCALC 144 (H) 03/13/2021  ? CREATININE 0.61 05/15/2021  ? ?Lab Results  ?Component Value Date  ? MICRALBCREAT 10.2 07/14/2020  ? ? ?Lab Results  ?Component Value Date  ? FRUCTOSAMINE 274 09/15/2021  ? FRUCTOSAMINE 346 (H) 05/15/2021  ? FRUCTOSAMINE 386 (H) 03/13/2021  ? ? ?Appointment on 09/15/2021  ?Component Date Value Ref Range Status  ? Direct LDL 09/15/2021 133.0  mg/dL Final  ? Optimal:  <100 mg/dLNear or Above Optimal:  100-129 mg/dLBorderline High:  130-159 mg/dLHigh:  160-189 mg/dLVery High:  >190 mg/dL  ? Fructosamine 09/15/2021 274  0 - 285 umol/L Final  ? Comment: Published reference interval for apparently healthy subjects ?between age 12 and 21 is 49 - 285 umol/L and in a poorly ?controlled diabetic population is 228 - 563 umol/L with a ?mean of 396 umol/L. ?  ? Glucose, Bld 09/15/2021 85  70 - 99 mg/dL Final  ? ? ? ?Allergies as of 09/16/2021   ? ?   Reactions  ? Fentanyl Anxiety  ? Made her feel very anxious, requests not to get it.  ? Levofloxacin Anxiety  ? Weird dreams   ? Amoxicillin Diarrhea  ? Has patient had a PCN reaction causing immediate rash, facial/tongue/throat swelling, SOB or lightheadedness with hypotension: No ?Has patient had a PCN reaction causing severe rash involving mucus membranes or skin necrosis: No ?Has patient had a PCN reaction that required hospitalization: No ?Has patient had a PCN reaction occurring within the last 10 years: Yes ?If all of the above answers are "NO", then may proceed with  Cephalosporin use.  ? Augmentin [amoxicillin-pot Clavulanate] Other (See Comments)  ? diarrhea  ? Wilder Glade [dapagliflozin] Diarrhea  ? Gi pain  ? Statins   ? Myalgia   ? ?  ? ?  ?Medication List  ?  ? ?  ? Accurate as of September 16, 2021  2:51 PM. If you have any questions, ask your nurse or doctor.  ?  ?  ? ?  ? ?ALPRAZolam 0.5 MG tablet ?Commonly known as: Duanne Moron ?TAKE 1  TABLET(0.5 MG) BY MOUTH AT BEDTIME AS NEEDED FOR SLEEP ?  ?AMBULATORY NON FORMULARY MEDICATION ?ACTIVE LIVER ?Artichoke leaf extract, Milk thistle, Turmeric ?Take 1 capsule by mouth once daily ?  ?aspirin 325 MG tablet ?Take 650-975 mg by mouth daily as needed for moderate pain or headache. ?  ?cetirizine 10 MG tablet ?Commonly known as: ZYRTEC ?Take 10 mg by mouth daily. ?  ?cyclobenzaprine 10 MG tablet ?Commonly known as: FLEXERIL ?TAKE 1 TABLET BY MOUTH EVERY 8 HOURS AS NEEDED FOR MUSCLE SPASMS WITH ONE BEING AT BEDTIME IF NEEDED ?  ?diphenhydrAMINE 25 mg capsule ?Commonly known as: BENADRYL ?Take 25-50 mg by mouth daily as needed for allergies. ?  ?ezetimibe 10 MG tablet ?Commonly known as: ZETIA ?TAKE 1 TABLET(10 MG) BY MOUTH DAILY ?  ?fluticasone-salmeterol 100-50 MCG/ACT Aepb ?Commonly known as: ADVAIR ?Inhale 1 puff into the lungs 2 (two) times daily. ?  ?FreeStyle Libre 3 Sensor Misc ?1 each by Does not apply route every 14 (fourteen) days. ?  ?furosemide 40 MG tablet ?Commonly known as: LASIX ?TAKE 1 TABLET(40 MG) BY MOUTH DAILY ?  ?furosemide 20 MG tablet ?Commonly known as: LASIX ?Take 20 mg by mouth daily. ?  ?Hair Skin and Nails Formula Tabs ?Take 1 tablet by mouth daily. ?  ?Insulin Lispro Prot & Lispro (75-25) 100 UNIT/ML Kwikpen ?Commonly known as: HumaLOG Mix 75/25 KwikPen ?Inject 28 units in the morning and 12 units at supper. Increase 4 units at supper if sugar is over 150 ?  ?Insulin Pen Needle 31G X 5 MM Misc ?Use twice a day with insulin pen ?  ?lansoprazole 30 MG capsule ?Commonly known as: PREVACID ?TAKE 1 CAPSULE(30 MG) BY  MOUTH DAILY ?  ?montelukast 10 MG tablet ?Commonly known as: SINGULAIR ?Take 1 tablet (10 mg total) by mouth at bedtime. ?  ?OneTouch Delica Plus [id number] Misc ?USE AS DIRECTED UP TO 4 TIMES DAILY AS DIRECTED ?

## 2021-09-21 ENCOUNTER — Ambulatory Visit (HOSPITAL_COMMUNITY): Admission: RE | Admit: 2021-09-21 | Payer: 59 | Source: Ambulatory Visit

## 2021-09-21 ENCOUNTER — Encounter (HOSPITAL_COMMUNITY): Payer: Self-pay

## 2021-09-25 ENCOUNTER — Ambulatory Visit (HOSPITAL_BASED_OUTPATIENT_CLINIC_OR_DEPARTMENT_OTHER)
Admission: RE | Admit: 2021-09-25 | Discharge: 2021-09-25 | Disposition: A | Discharge status: Home / Self Care | Payer: 59 | Source: Ambulatory Visit | Attending: Nurse Practitioner | Admitting: Nurse Practitioner

## 2021-09-25 DIAGNOSIS — K7469 Other cirrhosis of liver: Secondary | ICD-10-CM | POA: Insufficient documentation

## 2021-10-09 ENCOUNTER — Other Ambulatory Visit: Payer: Self-pay | Admitting: Internal Medicine

## 2021-10-09 DIAGNOSIS — R52 Pain, unspecified: Secondary | ICD-10-CM

## 2021-10-12 ENCOUNTER — Encounter: Payer: Self-pay | Admitting: Endocrinology

## 2021-10-27 ENCOUNTER — Other Ambulatory Visit: Payer: Self-pay | Admitting: Endocrinology

## 2021-10-28 ENCOUNTER — Other Ambulatory Visit: Payer: Self-pay

## 2021-10-28 MED ORDER — TRULICITY 3 MG/0.5ML ~~LOC~~ SOAJ
3.0000 mg | SUBCUTANEOUS | 2 refills | Status: DC
Start: 2021-10-28 — End: 2022-03-31

## 2021-10-30 ENCOUNTER — Other Ambulatory Visit (INDEPENDENT_AMBULATORY_CARE_PROVIDER_SITE_OTHER): Payer: 59

## 2021-10-30 DIAGNOSIS — Z794 Long term (current) use of insulin: Secondary | ICD-10-CM | POA: Diagnosis not present

## 2021-10-30 DIAGNOSIS — E1165 Type 2 diabetes mellitus with hyperglycemia: Secondary | ICD-10-CM

## 2021-10-30 DIAGNOSIS — E7800 Pure hypercholesterolemia, unspecified: Secondary | ICD-10-CM | POA: Diagnosis not present

## 2021-10-30 LAB — LIPID PANEL
Cholesterol: 218 mg/dL — ABNORMAL HIGH (ref 0–200)
HDL: 52.1 mg/dL (ref >39.00)
LDL Cholesterol: 146 mg/dL — ABNORMAL HIGH (ref 0–99)
NonHDL: 165.68
Total CHOL/HDL Ratio: 4
Triglycerides: 98 mg/dL (ref 0.0–149.0)
VLDL: 19.6 mg/dL (ref 0.0–40.0)

## 2021-10-30 LAB — COMPREHENSIVE METABOLIC PANEL
ALT: 24 U/L (ref 0–35)
AST: 30 U/L (ref 0–37)
Albumin: 4.3 g/dL (ref 3.5–5.2)
Alkaline Phosphatase: 94 U/L (ref 39–117)
BUN: 14 mg/dL (ref 6–23)
CO2: 30 mEq/L (ref 19–32)
Calcium: 9.7 mg/dL (ref 8.4–10.5)
Chloride: 95 mEq/L — ABNORMAL LOW (ref 96–112)
Creatinine, Ser: 0.7 mg/dL (ref 0.40–1.20)
GFR: 88.18 mL/min (ref >60.00)
Glucose, Bld: 161 mg/dL — ABNORMAL HIGH (ref 70–99)
Potassium: 5 mEq/L (ref 3.5–5.1)
Sodium: 135 mEq/L (ref 135–145)
Total Bilirubin: 1.2 mg/dL (ref 0.2–1.2)
Total Protein: 7.9 g/dL (ref 6.0–8.3)

## 2021-10-30 LAB — HEMOGLOBIN A1C: Hgb A1c MFr Bld: 6.6 % — ABNORMAL HIGH (ref 4.6–6.5)

## 2021-11-01 ENCOUNTER — Other Ambulatory Visit: Payer: Self-pay

## 2021-11-01 ENCOUNTER — Encounter (HOSPITAL_BASED_OUTPATIENT_CLINIC_OR_DEPARTMENT_OTHER): Payer: Self-pay

## 2021-11-01 ENCOUNTER — Emergency Department (HOSPITAL_BASED_OUTPATIENT_CLINIC_OR_DEPARTMENT_OTHER)
Admission: EM | Admit: 2021-11-01 | Discharge: 2021-11-01 | Disposition: A | Discharge status: Home / Self Care | Payer: 59 | Attending: Emergency Medicine | Admitting: Emergency Medicine

## 2021-11-01 ENCOUNTER — Emergency Department (HOSPITAL_BASED_OUTPATIENT_CLINIC_OR_DEPARTMENT_OTHER): Payer: 59

## 2021-11-01 DIAGNOSIS — Z20822 Contact with and (suspected) exposure to covid-19: Secondary | ICD-10-CM | POA: Insufficient documentation

## 2021-11-01 DIAGNOSIS — R739 Hyperglycemia, unspecified: Secondary | ICD-10-CM | POA: Diagnosis not present

## 2021-11-01 DIAGNOSIS — J0191 Acute recurrent sinusitis, unspecified: Secondary | ICD-10-CM | POA: Insufficient documentation

## 2021-11-01 DIAGNOSIS — Z794 Long term (current) use of insulin: Secondary | ICD-10-CM | POA: Insufficient documentation

## 2021-11-01 DIAGNOSIS — I10 Essential (primary) hypertension: Secondary | ICD-10-CM | POA: Diagnosis not present

## 2021-11-01 DIAGNOSIS — Z79899 Other long term (current) drug therapy: Secondary | ICD-10-CM | POA: Insufficient documentation

## 2021-11-01 DIAGNOSIS — Z7982 Long term (current) use of aspirin: Secondary | ICD-10-CM | POA: Insufficient documentation

## 2021-11-01 DIAGNOSIS — R0602 Shortness of breath: Secondary | ICD-10-CM | POA: Diagnosis not present

## 2021-11-01 DIAGNOSIS — R0981 Nasal congestion: Secondary | ICD-10-CM | POA: Diagnosis present

## 2021-11-01 LAB — CBC WITH DIFFERENTIAL/PLATELET
Abs Immature Granulocytes: 0.05 10*3/uL (ref 0.00–0.07)
Basophils Absolute: 0 10*3/uL (ref 0.0–0.1)
Basophils Relative: 0 %
Eosinophils Absolute: 0.1 10*3/uL (ref 0.0–0.5)
Eosinophils Relative: 1 %
HCT: 37 % (ref 36.0–46.0)
Hemoglobin: 11.6 g/dL — ABNORMAL LOW (ref 12.0–15.0)
Immature Granulocytes: 1 %
Lymphocytes Relative: 10 %
Lymphs Abs: 1 10*3/uL (ref 0.7–4.0)
MCH: 27.3 pg (ref 26.0–34.0)
MCHC: 31.4 g/dL (ref 30.0–36.0)
MCV: 87.1 fL (ref 80.0–100.0)
Monocytes Absolute: 0.6 10*3/uL (ref 0.1–1.0)
Monocytes Relative: 5 %
Neutro Abs: 8.8 10*3/uL — ABNORMAL HIGH (ref 1.7–7.7)
Neutrophils Relative %: 83 %
Platelets: 172 10*3/uL (ref 150–400)
RBC: 4.25 MIL/uL (ref 3.87–5.11)
RDW: 15.3 % (ref 11.5–15.5)
WBC: 10.5 10*3/uL (ref 4.0–10.5)
nRBC: 0 % (ref 0.0–0.2)

## 2021-11-01 LAB — COMPREHENSIVE METABOLIC PANEL
ALT: 25 U/L (ref 0–44)
AST: 31 U/L (ref 15–41)
Albumin: 4.1 g/dL (ref 3.5–5.0)
Alkaline Phosphatase: 77 U/L (ref 38–126)
Anion gap: 10 (ref 5–15)
BUN: 14 mg/dL (ref 8–23)
CO2: 26 mmol/L (ref 22–32)
Calcium: 9.3 mg/dL (ref 8.9–10.3)
Chloride: 99 mmol/L (ref 98–111)
Creatinine, Ser: 0.62 mg/dL (ref 0.44–1.00)
GFR, Estimated: >60 mL/min (ref >60)
Glucose, Bld: 262 mg/dL — ABNORMAL HIGH (ref 70–99)
Potassium: 3.5 mmol/L (ref 3.5–5.1)
Sodium: 135 mmol/L (ref 135–145)
Total Bilirubin: 0.8 mg/dL (ref 0.3–1.2)
Total Protein: 7.5 g/dL (ref 6.5–8.1)

## 2021-11-01 LAB — RESP PANEL BY RT-PCR (FLU A&B, COVID) ARPGX2
Influenza A by PCR: NEGATIVE
Influenza B by PCR: NEGATIVE
SARS Coronavirus 2 by RT PCR: NEGATIVE

## 2021-11-01 MED ORDER — LACTATED RINGERS IV BOLUS
1000.0000 mL | Freq: Once | INTRAVENOUS | Status: AC
  Administered 2021-11-01: 1000 mL via INTRAVENOUS

## 2021-11-01 MED ORDER — BENZONATATE 100 MG PO CAPS: 100.0000 mg | ORAL_CAPSULE | Freq: Three times a day (TID) | ORAL | 0 refills | Status: DC

## 2021-11-01 MED ORDER — AZITHROMYCIN 250 MG PO TABS: 250.0000 mg | ORAL_TABLET | Freq: Every day | ORAL | 0 refills | Status: DC

## 2021-11-01 NOTE — ED Triage Notes (Signed)
Reports nonproductive cough x 2 days and shortness of breath beginning last night. Uses an inhaler PRN for mild wheezing that is usually worse during allergy season.   Also sts feeling fatigued and lightheaded/

## 2021-11-01 NOTE — ED Provider Notes (Signed)
[facility] EMERGENCY DEPT Provider Note   CSN: [id number] Arrival date & time: 11/01/21  1940     History  Chief Complaint  Patient presents with   Shortness of Breath    Miranda Cochran is a 71 y.o. female.  HPI 71 year old female presents with concern for sinus infection.  She has been dealing with this for about a week but over the last 2 days is gotten worse.  She has yellow/green congestion, a nonproductive cough, loss of voice, and low-grade fevers.  She also feels short of breath but denies chest pain, abdominal pain, vomiting.  She is also been feeling quite fatigued.  She has a pertinent past medical history for cirrhosis and is on chronic prednisone which has caused her to have diabetes.  She has hypertension and hyperlipidemia.  She has recurrent sinus issues.  Home Medications Prior to Admission medications   Medication Sig Start Date End Date Taking? Authorizing Provider  azithromycin (ZITHROMAX) 250 MG tablet Take 1 tablet (250 mg total) by mouth daily. Take first 2 tablets together, then 1 every day until finished. 11/01/21  Yes Sherwood Gambler, MD  benzonatate (TESSALON) 100 MG capsule Take 1 capsule (100 mg total) by mouth every 8 (eight) hours. 11/01/21  Yes Sherwood Gambler, MD  ALPRAZolam Duanne Moron) 0.5 MG tablet TAKE 1 TABLET(0.5 MG) BY MOUTH AT BEDTIME AS NEEDED FOR SLEEP 04/21/21   Laurey Morale, MD  AMBULATORY NON FORMULARY MEDICATION ACTIVE LIVER Artichoke leaf extract, Milk thistle, Turmeric Take 1 capsule by mouth once daily    [provider]  aspirin 325 MG tablet Take 650-975 mg by mouth daily as needed for moderate pain or headache.     [provider]  Blood Glucose Monitoring Suppl ([place]) w/Device KIT Use Onetouch Verio Flex to check blood sugar once daily. 12/22/18   Elayne Snare, MD  cetirizine (ZYRTEC) 10 MG tablet Take 10 mg by mouth daily.    [provider]  Continuous Blood Gluc Sensor  (FREESTYLE LIBRE 3 SENSOR) MISC 1 each by Does not apply route every 14 (fourteen) days. 08/17/21   Elayne Snare, MD  cyclobenzaprine (FLEXERIL) 10 MG tablet TAKE 1 TABLET BY MOUTH EVERY 8 HOURS AS NEEDED FOR MUSCLE SPASMS WITH ONE BEING AT BEDTIME IF NEEDED 10/16/19   Laurey Morale, MD  diphenhydrAMINE (BENADRYL) 25 mg capsule Take 25-50 mg by mouth daily as needed for allergies.     [provider]  Dulaglutide (TRULICITY) 1.5 YD/7.4JO SOPN ADMINISTER 1.5 MG UNDER THE SKIN 1 TIME A WEEK 10/27/21   Elayne Snare, MD  Dulaglutide (TRULICITY) 3 IN/8.6VE SOPN Inject 3 mg as directed once a week. 10/28/21   Elayne Snare, MD  ezetimibe (ZETIA) 10 MG tablet TAKE 1 TABLET(10 MG) BY MOUTH DAILY 08/25/21   Elayne Snare, MD  fluticasone-salmeterol (ADVAIR) 100-50 MCG/ACT AEPB Inhale 1 puff into the lungs 2 (two) times daily.    [provider]  furosemide (LASIX) 20 MG tablet Take 20 mg by mouth daily. 12/27/20   [provider]  furosemide (LASIX) 40 MG tablet TAKE 1 TABLET(40 MG) BY MOUTH DAILY 10/24/20   Laurey Morale, MD  Insulin Lispro Prot & Lispro (HUMALOG MIX 75/25 KWIKPEN) (75-25) 100 UNIT/ML Kwikpen Inject up to 40 units before lunch and up to 16 units at supper. 09/16/21   Elayne Snare, MD  Insulin Pen Needle 31G X 5 MM MISC Use twice a day with insulin pen 09/01/21   Elayne Snare,  MD  Lancets (ONETOUCH DELICA PLUS [id number]) MISC USE AS DIRECTED UP TO 4 TIMES DAILY AS DIRECTED 12/04/18   Elayne Snare, MD  lansoprazole (PREVACID) 30 MG capsule TAKE 1 CAPSULE(30 MG) BY MOUTH DAILY 04/21/21   Laurey Morale, MD  montelukast (SINGULAIR) 10 MG tablet Take 1 tablet (10 mg total) by mouth at bedtime. 04/21/21   Laurey Morale, MD  Multiple Vitamins-Minerals (HAIR SKIN AND NAILS FORMULA) TABS Take 1 tablet by mouth daily.    [provider]  [facility] VERIO test strip USE TO CHECK BLOOD SUGAR ONCE DAILY 06/11/20   Elayne Snare, MD  predniSONE (DELTASONE) 10 MG tablet Take 10 mg by mouth daily  with breakfast.    [provider]  promethazine (PHENERGAN) 25 MG tablet Take 1 tablet (25 mg total) by mouth every 8 (eight) hours as needed for nausea or vomiting. 05/06/20   Gatha Mayer, MD  sodium chloride (BRONCHO SALINE) inhaler solution Take 1 spray by nebulization as needed.    [provider]  sulfamethoxazole-trimethoprim (BACTRIM DS) 800-160 MG tablet Take 1 tablet by mouth 2 (two) times daily. 07/09/21   [name], MD  venlafaxine XR (EFFEXOR-XR) 75 MG 24 hr capsule TAKE 1 CAPSULE BY MOUTH TWICE DAILY WITH A MEAL 10/09/21   Gatha Mayer, MD      Allergies    Fentanyl, Levofloxacin, Amoxicillin, Augmentin [amoxicillin-pot clavulanate], Farxiga [dapagliflozin], and Statins    Review of Systems   Review of Systems  Constitutional:  Negative for fever.  HENT:  Positive for congestion, sinus pain, sore throat and voice change.   Respiratory:  Positive for cough and shortness of breath.   Cardiovascular:  Negative for chest pain.  Gastrointestinal:  Negative for abdominal pain and vomiting.   Physical Exam Updated Vital Signs BP (!) 145/73 (BP Location: Right Arm)   Pulse 93   Temp 99 F (37.2 C)   Resp 16   Ht '5\' 6"'  (1.676 m)   Wt 61.2 kg   SpO2 94%   BMI 21.79 kg/m  Physical Exam Vitals and nursing note reviewed.  Constitutional:      Appearance: She is well-developed.  HENT:     Head: Normocephalic and atraumatic.     Nose:     Right Sinus: Maxillary sinus tenderness and frontal sinus tenderness present.     Left Sinus: Maxillary sinus tenderness and frontal sinus tenderness present.     Comments: Mild sinus tenderness bilaterally Cardiovascular:     Rate and Rhythm: Normal rate and regular rhythm.     Heart sounds: Normal heart sounds.  Pulmonary:     Effort: Pulmonary effort is normal.     Breath sounds: Normal breath sounds. No stridor. No wheezing, rhonchi or rales.  Abdominal:     Palpations: Abdomen is soft.      Tenderness: There is no abdominal tenderness.  Musculoskeletal:     Right lower leg: No edema.     Left lower leg: No edema.  Skin:    General: Skin is warm and dry.  Neurological:     Mental Status: She is alert.    ED Results / Procedures / Treatments   Labs (all labs ordered are listed, but only abnormal results are displayed) Labs Reviewed  CBC WITH DIFFERENTIAL/PLATELET - Abnormal; Notable for the following components:      Result Value   Hemoglobin 11.6 (*)    Neutro Abs 8.8 (*)    All other components within normal limits  COMPREHENSIVE METABOLIC PANEL - Abnormal; Notable for the following components:   Glucose, Bld 262 (*)    All other components within normal limits  RESP PANEL BY RT-PCR (FLU A&B, COVID) [id number]    EKG EKG Interpretation  Date/Time:  'Sunday Nov 01 2021 20:01:48 EDT Ventricular Rate:  94 PR Interval:  136 QRS Duration: 74 QT Interval:  372 QTC Calculation: 465 R Axis:   45 Text Interpretation: Normal sinus rhythm Nonspecific ST abnormality similar to Dec 2018 Confirmed by Analise Glotfelty ([id number]) on 11/01/2021 9:11:11 PM  Radiology DG Chest Port 1 View  Result Date: 11/01/2021 CLINICAL DATA:  Cough. EXAM: PORTABLE CHEST 1 VIEW COMPARISON:  Chest radiograph dated 05/16/2017. FINDINGS: No focal consolidation, pleural effusion, or pneumothorax. The cardiac silhouette is within normal limits. Atherosclerotic calcification of the aortic arch. No acute osseous pathology. IMPRESSION: No active disease. Electronically Signed   By: Arash  Radparvar M.D.   On: 11/01/2021 21:26    Procedures Procedures    Medications Ordered in ED Medications  lactated ringers bolus 1,000 mL (1,000 mLs Intravenous New Bag/Given 11/01/21 2204)    ED Course/ Medical Decision Making/ A&P                           Medical Decision Making Amount and/or Complexity of Data Reviewed Independent Historian: spouse External Data Reviewed: notes. Labs: ordered. Radiology:  ordered and independent interpretation performed. ECG/medicine tests: ordered and independent interpretation performed.  Risk Prescription drug management.   ECG shows no acute ischemia on my interpretation.  Chest x-ray images viewed by myself and no pneumonia.  Lungs are clear.  Labs are pertinent for a glucose of 260 which is higher than her baseline as well as a hemoglobin of 11.6 which is a little lower than baseline.  However she denies any blood loss symptoms such as rectal bleeding or melena.  The fatigue could be from the anemia but it is probably more likely from the acute illness as well as some hyperglycemia.  She was given IV fluid bolus.  She been trying multiple over-the-counter treatments for sinuses.  With her immunocompromise state with cirrhosis and prednisone daily use as well as hyperglycemia, she has had a little bit of a high risk of a bacterial infection.  Given the 1 week of symptoms is now slightly worse I think is reasonable to try and treat with some antibiotics.  She does have a nonspecific elevated neutrophil count but technically a normal white count.  However she otherwise appears stable for discharge home for outpatient management and follow-up with PCP.        Final Clinical Impression(s) / ED Diagnoses Final diagnoses:  Acute recurrent sinusitis, unspecified location  Hyperglycemia    Rx / DC Orders ED Discharge Orders          Ordered    azithromycin (ZITHROMAX) 250 MG tablet  Daily        11/01/21 2150    benzonatate (TESSALON) 100 MG capsule  Every 8 hours        05' /21/23 2150              Sherwood Gambler, MD 11/01/21 2310

## 2021-11-03 ENCOUNTER — Ambulatory Visit: Payer: 59 | Admitting: Endocrinology

## 2021-11-08 ENCOUNTER — Other Ambulatory Visit: Payer: Self-pay | Admitting: Family Medicine

## 2021-11-10 ENCOUNTER — Other Ambulatory Visit: Payer: Self-pay | Admitting: Endocrinology

## 2021-11-10 DIAGNOSIS — E1165 Type 2 diabetes mellitus with hyperglycemia: Secondary | ICD-10-CM

## 2021-12-10 ENCOUNTER — Ambulatory Visit (INDEPENDENT_AMBULATORY_CARE_PROVIDER_SITE_OTHER): Payer: 59 | Admitting: Obstetrics & Gynecology

## 2021-12-10 ENCOUNTER — Other Ambulatory Visit (HOSPITAL_COMMUNITY)
Admission: RE | Admit: 2021-12-10 | Discharge: 2021-12-10 | Disposition: A | Discharge status: Home / Self Care | Payer: 59 | Source: Ambulatory Visit | Attending: Obstetrics & Gynecology | Admitting: Obstetrics & Gynecology

## 2021-12-10 ENCOUNTER — Encounter: Payer: Self-pay | Admitting: Obstetrics & Gynecology

## 2021-12-10 VITALS — BP 118/80 | HR 90 | Ht 64.25 in | Wt 139.0 lb

## 2021-12-10 DIAGNOSIS — R87810 Cervical high risk human papillomavirus (HPV) DNA test positive: Secondary | ICD-10-CM

## 2021-12-10 DIAGNOSIS — R87610 Atypical squamous cells of undetermined significance on cytologic smear of cervix (ASC-US): Secondary | ICD-10-CM | POA: Diagnosis not present

## 2021-12-10 DIAGNOSIS — Z13820 Encounter for screening for osteoporosis: Secondary | ICD-10-CM | POA: Diagnosis not present

## 2021-12-10 DIAGNOSIS — Z780 Asymptomatic menopausal state: Secondary | ICD-10-CM | POA: Diagnosis not present

## 2021-12-10 DIAGNOSIS — Z01419 Encounter for gynecological examination (general) (routine) without abnormal findings: Secondary | ICD-10-CM

## 2021-12-10 NOTE — Progress Notes (Signed)
Miranda Cochran 12-Sep-1950 [id number]   History:    71 y.o. G0 Married               RP:  Established patient presenting for annual gyn exam   HPI: Postmenopause, well on no HRT.  No PMB.  No pelvic pain.  Sexually active, no pain with IC.  No abnormal vaginal d/c. In 07/2019 HPV HR Positive.  Pap ASCUS/HPV HR Pos 05/2021.  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  Pap/HPV HR today.  Breasts normal.  Mammo Neg 08/2021.  Urine/BMs normal.  BMI 23.67.  Good fitness.  Health labs with Fam MD.  Miranda Cochran 07/2020.  Will schedule BD here now.   Past medical history,surgical history, family history and social history were all reviewed and documented in the EPIC chart.  Gynecologic History No LMP recorded. Patient is postmenopausal.  Obstetric History OB History  Gravida Para Term Preterm AB Living  0 0 0 0 0 0  SAB IAB Ectopic Multiple Live Births  0 0 0 0 0     ROS: A ROS was performed and pertinent positives and negatives are included in the history.  GENERAL: No fevers or chills. HEENT: No change in vision, no earache, sore throat or sinus congestion. NECK: No pain or stiffness. CARDIOVASCULAR: No chest pain or pressure. No palpitations. PULMONARY: No shortness of breath, cough or wheeze. GASTROINTESTINAL: No abdominal pain, nausea, vomiting or diarrhea, melena or bright red blood per rectum. GENITOURINARY: No urinary frequency, urgency, hesitancy or dysuria. MUSCULOSKELETAL: No joint or muscle pain, no back pain, no recent trauma. DERMATOLOGIC: No rash, no itching, no lesions. ENDOCRINE: No polyuria, polydipsia, no heat or cold intolerance. No recent change in weight. HEMATOLOGICAL: No anemia or easy bruising or bleeding. NEUROLOGIC: No headache, seizures, numbness, tingling or weakness. PSYCHIATRIC: No depression, no loss of interest in normal activity or change in sleep pattern.     Exam:   BP 118/80   Pulse 90   Ht 5' 4.25" (1.632 m)   Wt 139 lb (63 kg)   SpO2 96%   BMI 23.67 kg/m    Body mass index is 23.67 kg/m.  General appearance : Well developed well nourished female. No acute distress HEENT: Eyes: no retinal hemorrhage or exudates,  Neck supple, trachea midline, no carotid bruits, no thyroidmegaly Lungs: Clear to auscultation, no rhonchi or wheezes, or rib retractions  Heart: Regular rate and rhythm, no murmurs or gallops Breast:Examined in sitting and supine position were symmetrical in appearance, no palpable masses or tenderness,  no skin retraction, no nipple inversion, no nipple discharge, no skin discoloration, no axillary or supraclavicular lymphadenopathy Abdomen: no palpable masses or tenderness, no rebound or guarding Extremities: no edema or skin discoloration or tenderness  Pelvic: Vulva: Normal             Vagina: No gross lesions or discharge  Cervix: No gross lesions or discharge.  Pap/HPV HR done.  Uterus  AV, normal size, shape and consistency, non-tender and mobile  Adnexa  Without masses or tenderness  Anus: Normal   Assessment/Plan:  71 y.o. female for annual exam   1. Encounter for routine gynecological examination with Papanicolaou smear of cervix Postmenopause, well on no HRT.  No PMB.  No pelvic pain.  Sexually active, no pain with IC.  No abnormal vaginal d/c. In 07/2019 HPV HR Positive.  Pap ASCUS/HPV HR Pos 05/2021.  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  Pap/HPV HR today.  Breasts normal.  Mammo Neg 08/2021.  Urine/BMs normal.  BMI 23.67.  Good fitness.  Health labs with Fam MD.  Miranda Cochran 07/2020.  Will schedule BD here now. - Cytology - PAP( [place])  2. ASCUS with positive high risk HPV cervical - Cytology - PAP( [place])  3. Postmenopause Postmenopause, well on no HRT.  No PMB.  No pelvic pain.  Sexually active, no pain with IC.  No abnormal vaginal d/c  4. Screening for osteoporosis Postmenopause Low estrogen state.  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  Vit D, Ca++ 1.5 g/d total and regular weight bearing  activities.  Schedule BD. - DG Bone Density; Future  Other orders - repaglinide (PRANDIN) 1 MG tablet; Take by mouth.   [name], 9:52 AM 12/10/2021

## 2021-12-12 ENCOUNTER — Other Ambulatory Visit: Payer: Self-pay | Admitting: Endocrinology

## 2021-12-17 ENCOUNTER — Ambulatory Visit (INDEPENDENT_AMBULATORY_CARE_PROVIDER_SITE_OTHER): Payer: 59 | Admitting: Endocrinology

## 2021-12-17 ENCOUNTER — Encounter: Payer: Self-pay | Admitting: Endocrinology

## 2021-12-17 VITALS — BP 138/60 | HR 91 | Ht 66.0 in | Wt 142.0 lb

## 2021-12-17 DIAGNOSIS — E1165 Type 2 diabetes mellitus with hyperglycemia: Secondary | ICD-10-CM | POA: Diagnosis not present

## 2021-12-17 DIAGNOSIS — E7800 Pure hypercholesterolemia, unspecified: Secondary | ICD-10-CM

## 2021-12-17 DIAGNOSIS — Z794 Long term (current) use of insulin: Secondary | ICD-10-CM | POA: Diagnosis not present

## 2021-12-17 LAB — CYTOLOGY - PAP
Comment: NEGATIVE
High risk HPV: POSITIVE — AB

## 2021-12-17 MED ORDER — HUMALOG KWIKPEN 200 UNIT/ML ~~LOC~~ SOPN
PEN_INJECTOR | SUBCUTANEOUS | 1 refills | Status: DC
Start: 2021-12-17 — End: 2022-02-08

## 2021-12-17 NOTE — Progress Notes (Signed)
[name], female   DOB: Sep 05, 1950, 71 y.o.   MRN: [id number]            Reason for Appointment: Follow-up for Type 2 Diabetes   History of Present Illness:          Date of diagnosis of type 2 diabetes mellitus: ?  2013        Background history:   She had a blood sugar over 200 in 2013 but was not told to have diabetes until 04/2015 A1c was 7.3 in 3/16 In 04/2015 blood sugar was 478 says her blood sugar was 600 when she was diagnosed and was not having any significant symptoms Initially treated with Metaglip but this apparently caused abdominal discomfort and she was switched to metformin In 2017 she had been tried on Bydureon for better control but she stopped this after a few weeks because of large persisted nodules in her skin She thinks her blood sugars were better with this and A1c was down to 6.8  Recent history:   Non-insulin hypoglycemic drugs the patient is taking are: Trulicity 3 mg weekly, Prandin Insulin regimen: HUMALOG mix 75/25, 28 units every morning--12-14   Her A1c is 6.6 as of 5/23 She has not had any recent labs  Current management, blood sugar patterns and problems identified:   She has stopped her insulin for the last week or 10 days because she was not finding her blood sugars well controlled  She is reluctant to continue insulin since even with increasing her doses her blood sugars were frequently higher in the evenings  She says she wants to try Rybelsus even though she is still taking Trulicity  Also appears to be gaining weight Her prednisone dose is consistent at 10 mg in the mornings She says she had progressively increase her insulin to as much as 48 units in the morning and 28 in the evening without consistent benefit However her A1c was improved as of 5/23 compared to 9% before She is concerned that her blood sugars drop significantly overnight even though she is asymptomatic with blood sugars as low as 68        Side  effects from medications have been: Metaglip caused abdominal pain, Tradjenta caused swelling of the legs, Bydureon caused skin nodules, Farxiga caused diarrhea, glipizide causes dizziness and weakness, Metformin caused GI side effects, Invokana causes yeast infections  Libre 3 data as follows: GMI 8.8 %  Summary of patterns: Significant variability is present at all times Blood sugars are significantly lower in the morning hours but subsequently rise progressively and generally plateau after 6 PM until 1 AM  OVERNIGHT blood sugars are quite variable with occasionally hyperglycemia persisting for few hours and occasionally blood sugars are decreasing after about 3 AM without hypoglycemia   HYPERGLYCEMIC episodes are occurring variably from day-to-day and at times are exceeding 350 especially in the late afternoon and evenings  occurring in the afternoons and continuing to variable extent in the evenings with occasional high sugars running late into the night Hypoglycemia not present  POSTPRANDIAL readings are reportedly rising significantly after meals which are at variable times but more in the evenings into late at night   CGM use % of time 90  2-week average/GV 230/39  Time in range        36%  % Time Above 180 22  % Time above 250 42  % Time Below 70 0     PRE-MEAL Fasting Lunch Dinner Bedtime  Overall  Glucose range:       Averages: 141 151      POST-MEAL PC Breakfast PC Lunch PC Dinner  Glucose range:     Averages:  265 309   PREVIOUSLY:   CGM use % of time 97  2-week average/GV 198 35  Time in range 43     %  % Time Above 180 34  % Time above 250 22  % Time Below 70 1     PRE-MEAL Fasting Lunch Dinner Bedtime Overall  Glucose range:       Averages: 142  253  198   POST-MEAL PC Breakfast PC Lunch PC Dinner  Glucose range:     Averages:  235 221    Self-care:  Typical meal intake: Breakfast is Oatmeal, usually eating low-fat dinner and her snacks will be fruit,  peanut butter crackers                Dietician visit, most recent: None               Weight history:  Wt Readings from Last 3 Encounters:  12/17/21 142 lb (64.4 kg)  12/10/21 139 lb (63 kg)  11/01/21 135 lb (61.2 kg)    Glycemic control:   Lab Results  Component Value Date   HGBA1C 6.6 (H) 10/30/2021   HGBA1C 9.0 (A) 08/05/2021   HGBA1C 7.3 (H) 03/13/2021   Lab Results  Component Value Date   MICROALBUR 2.2 (H) 07/14/2020   LDLCALC 146 (H) 10/30/2021   CREATININE 0.62 11/01/2021   Lab Results  Component Value Date   MICRALBCREAT 10.2 07/14/2020    Lab Results  Component Value Date   FRUCTOSAMINE 274 09/15/2021   FRUCTOSAMINE 346 (H) 05/15/2021   FRUCTOSAMINE 386 (H) 03/13/2021    No visits with results within 1 Week(s) from this visit.  Latest known visit with results is:  Office Visit on 12/10/2021  Component Date Value Ref Range Status   High risk HPV 12/10/2021 Positive (A)   Final   Adequacy 12/10/2021 Satisfactory for evaluation; transformation zone component PRESENT.   Final   Diagnosis 12/10/2021 - Low grade squamous intraepithelial lesion (LSIL) (A)   Final   Comment 12/10/2021 There are a few cells suggestive of a higher grade lesion. Clinical   Final   Comment 12/10/2021 correlation is recommended.   Final   Comment 12/10/2021 Normal Reference Range HPV - Negative   Final     Allergies as of 12/17/2021       Reactions   Fentanyl Anxiety   Made her feel very anxious, requests not to get it.   Levofloxacin Anxiety   Weird dreams    Amoxicillin Diarrhea   Has patient had a PCN reaction causing immediate rash, facial/tongue/throat swelling, SOB or lightheadedness with hypotension: No Has patient had a PCN reaction causing severe rash involving mucus membranes or skin necrosis: No Has patient had a PCN reaction that required hospitalization: No Has patient had a PCN reaction occurring within the last 10 years: Yes If all of the above answers are  "NO", then may proceed with Cephalosporin use.   Augmentin [amoxicillin-pot Clavulanate] Other (See Comments)   diarrhea   Farxiga [dapagliflozin] Diarrhea   Gi pain   Statins    Myalgia         Medication List        Accurate as of December 17, 2021  4:58 PM. If you have any questions, ask your nurse or doctor.  ALPRAZolam 0.5 MG tablet Commonly known as: XANAX TAKE 1 TABLET(0.5 MG) BY MOUTH AT BEDTIME AS NEEDED FOR SLEEP   AMBULATORY NON FORMULARY MEDICATION ACTIVE LIVER Artichoke leaf extract, Milk thistle, Turmeric Take 1 capsule by mouth once daily   aspirin 325 MG tablet Take 650-975 mg by mouth daily as needed for moderate pain or headache.   cetirizine 10 MG tablet Commonly known as: ZYRTEC Take 10 mg by mouth daily.   cyclobenzaprine 10 MG tablet Commonly known as: FLEXERIL TAKE 1 TABLET BY MOUTH EVERY 8 HOURS AS NEEDED FOR MUSCLE SPASMS WITH ONE BEING AT BEDTIME IF NEEDED   diphenhydrAMINE 25 mg capsule Commonly known as: BENADRYL Take 25-50 mg by mouth daily as needed for allergies.   ezetimibe 10 MG tablet Commonly known as: ZETIA TAKE 1 TABLET(10 MG) BY MOUTH DAILY   fluticasone-salmeterol 100-50 MCG/ACT Aepb Commonly known as: ADVAIR Inhale 1 puff into the lungs 2 (two) times daily.   FreeStyle Libre 3 Sensor Misc APPLY 1 SENSOR TO SKIN, CHANGE EVERY 14 DAYS AS DIRECTED   furosemide 40 MG tablet Commonly known as: LASIX TAKE 1 TABLET(40 MG) BY MOUTH DAILY   Hair Skin and Nails Formula Tabs Take 1 tablet by mouth daily.   HumaLOG KwikPen 200 UNIT/ML KwikPen Generic drug: insulin lispro 20 units before each meal Started by: Elayne Snare, MD   Insulin Lispro Prot & Lispro (75-25) 100 UNIT/ML Kwikpen Commonly known as: HumaLOG Mix 75/25 KwikPen Inject up to 40 units before lunch and up to 16 units at supper.   Insulin Pen Needle 31G X 5 MM Misc Use twice a day with insulin pen   lansoprazole 30 MG capsule Commonly known as:  PREVACID TAKE 1 CAPSULE(30 MG) BY MOUTH DAILY   OneTouch Delica Plus [id number] Misc USE AS DIRECTED UP TO 4 TIMES DAILY AS DIRECTED   OneTouch Verio Flex System w/Device Kit Use Onetouch Verio Flex to check blood sugar once daily.   OneTouch Verio test strip Generic drug: glucose blood USE TO CHECK BLOOD SUGAR ONCE DAILY   predniSONE 10 MG tablet Commonly known as: DELTASONE Take 10 mg by mouth daily with breakfast.   promethazine 25 MG tablet Commonly known as: PHENERGAN Take 1 tablet (25 mg total) by mouth every 8 (eight) hours as needed for nausea or vomiting.   repaglinide 1 MG tablet Commonly known as: PRANDIN Take by mouth.   sodium chloride inhaler solution Commonly known as: BRONCHO SALINE Take 1 spray by nebulization as needed.   Trulicity 3 YQ/6.5HQ Sopn Generic drug: Dulaglutide Inject 3 mg as directed once a week.   venlafaxine XR 75 MG 24 hr capsule Commonly known as: EFFEXOR-XR TAKE 1 CAPSULE BY MOUTH TWICE DAILY WITH A MEAL        Allergies:  Allergies  Allergen Reactions   Fentanyl Anxiety    Made her feel very anxious, requests not to get it.   Levofloxacin Anxiety    Weird dreams    Amoxicillin Diarrhea    Has patient had a PCN reaction causing immediate rash, facial/tongue/throat swelling, SOB or lightheadedness with hypotension: No Has patient had a PCN reaction causing severe rash involving mucus membranes or skin necrosis: No Has patient had a PCN reaction that required hospitalization: No Has patient had a PCN reaction occurring within the last 10 years: Yes If all of the above answers are "NO", then may proceed with Cephalosporin use.    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   GERD (gastroesophageal reflux disease)    on meds   Hiatal hernia    Hx of adenomatous polyp of colon 07/07/2009   Hyperlipidemia    diet controlled   Hypertension    on meds - LASIX   IBS (irritable bowel syndrome)    Iron deficiency anemia    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 Carlean Purl, adenimatous polyps, repeat in 7 yr   ESOPHAGOGASTRODUODENOSCOPY  02/2002   ESOPHAGOGASTRODUODENOSCOPY (EGD) WITH PROPOFOL N/A 06/20/2017   Procedure: ESOPHAGOGASTRODUODENOSCOPY (EGD) WITH PROPOFOL;  Surgeon: Gatha Mayer, MD;  Location: [facility];  Service: Endoscopy;  Laterality: N/A;  with Barrx   EYE SURGERY     NASAL SEPTOPLASTY W/ TURBINOPLASTY     POLYPECTOMY  2015   CG-TA   TONSILLECTOMY      Family History  Problem Relation Age of Onset   Stroke Mother    Hypertension Mother    Colon cancer Father 76   Liver cancer Father 75   Colon polyps Father 57   Heart disease Sister        hole in heart   Pancreatic cancer Maternal Aunt    Brain cancer Maternal Uncle    Stroke Maternal Grandmother    Hypertension Maternal Grandmother    Esophageal cancer Maternal Grandfather    Throat cancer Maternal Grandfather    Breast cancer Paternal Grandmother    Brain cancer Paternal Grandmother    Lung cancer Paternal Grandfather    Rectal  cancer Neg Hx    Stomach cancer Neg Hx     Social History:  reports that she has never smoked. She has never used smokeless tobacco. She reports current alcohol use. She reports that she does not use drugs.   Review of Systems   Lipid history: She has persistent hypercholesterolemia Highest baseline LDL about 170  Previously had tried Zocor which apparently caused muscle aches, she took Crestor and she thinks generic causes muscle cramps She took Livalo for a couple of days but she thinks she didn't feel well with this and had some palpitations reportedly  She has been prescribed Zetia  LDL has been as low as 97 previously but has been more consistently higher recently   Lab Results  Component Value Date   CHOL 218 (H) 10/30/2021   CHOL 235 (H) 03/13/2021   CHOL 163 07/14/2020   Lab Results  Component Value Date   HDL  52.10 10/30/2021   HDL 61.80 03/13/2021   HDL 41.50 07/14/2020   Lab Results  Component Value Date   LDLCALC 146 (H) 10/30/2021   LDLCALC 144 (H) 03/13/2021   LDLCALC 97 07/14/2020   Lab Results  Component Value Date   TRIG 98.0 10/30/2021   TRIG 149.0 03/13/2021   TRIG 120.0 07/14/2020   Lab Results  Component Value Date   CHOLHDL 4 10/30/2021   CHOLHDL 4 03/13/2021   CHOLHDL 4 07/14/2020   Lab Results  Component Value Date   LDLDIRECT 133.0 09/15/2021   LDLDIRECT 158.0 05/15/2021   LDLDIRECT 153.0 02/27/2019           Lab Results  Component Value Date   ALT 25 11/01/2021    Hypertension: Blood pressure has been low normal recently   BP Readings from Last 3 Encounters:  12/17/21 138/60  12/10/21 118/80  11/01/21 (!) 156/79    She takes Lasix  for complaints of having swelling in her abdomen Potassium has been fluctuating   Lab Results  Component Value Date   K 3.5 11/01/2021     Most recent eye exam was 10/20  Most recent foot exam: 2/22  Has had normal thyroid levels previously, benign thyroid nodule in 2012 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  Lab Results  Component Value Date   TSH 2.76 11/18/2020        Physical Examination:  BP 138/60   Pulse 91   Ht '5\' 6"'  (1.676 m)   Wt 142 lb (64.4 kg)   SpO2 91%   BMI 22.92 kg/m       ASSESSMENT:  Diabetes type 2, nonobese  See history of present illness for detailed discussion of current diabetes management, blood sugar patterns and problems identified  A1c has been over 9% but in May was down to 6.6  Currently on Prandin and Trulicity 3 mg weekly  She does have high readings recently with GMI on her sensor 8.8 even though her A1c had gone down in May Recently because of continuing prednisone she appears to be needing a large amount of insulin but she is reluctant to take insulin now Again discussed that prednisone is likely making her sugars go up between midday and early part of the night and then decreasing significantly She is still reluctant to consider insulin pump  Discussed blood sugar patterns seen on her freestyle libre sensor  PLAN:    She will go back on insulin After much discussion discussed that she cannot allow her blood sugar to be running in the 300s and 400s range without insulin while she is getting prednisone Since she appears to have rapid increase in blood sugar with her meals which can be at variable times she will first try HUMALOG Since she likely will need large doses she can use the U-200 Humalog insulin She will start with 15 to 20 units based on meal size and adjust further to keep her blood sugars from going up over at least 200 If her blood sugars are consistently high in the afternoons and evenings in between her meals will add another injection of NPH insulin in the morning She will discuss her progress with diabetes educator who can also reassess her diet  She will let us know if she is agreeable to switching to the OmniPod pump and again explained to her how this works and  how it is different from subcutaneous insulin injections No change in Trulicity  Encouraged her to increase walking for exercise   Patient Instructions  Start with 15-20 units Humalog at each meal and Carb snack   Total visit time including counseling = 30 minutes    Elayne Snare 12/17/2021, 4:58 PM   Note: This office note was prepared with Dragon voice recognition system technology. Any transcriptional errors that result from this process are unintentional.

## 2021-12-17 NOTE — Patient Instructions (Signed)
Start with 15-20 units Humalog at each meal and Carb snack

## 2021-12-18 ENCOUNTER — Encounter: Payer: Self-pay | Admitting: Endocrinology

## 2021-12-21 ENCOUNTER — Other Ambulatory Visit: Payer: Self-pay | Admitting: Endocrinology

## 2021-12-24 ENCOUNTER — Telehealth: Payer: Self-pay

## 2021-12-24 NOTE — Telephone Encounter (Signed)
[facility] called states they faxed over request of Prior Auth for patients omnipod dash on 12/16/21. Can we please start PA?

## 2021-12-28 ENCOUNTER — Other Ambulatory Visit (HOSPITAL_COMMUNITY): Payer: Self-pay

## 2021-12-28 ENCOUNTER — Telehealth: Payer: Self-pay | Admitting: Pharmacy Technician

## 2021-12-28 NOTE — Telephone Encounter (Signed)
Patient Advocate Encounter   Received notification from CMA/Office that prior authorization for Omnipod is required/requested.  Per Test Claim: Omnipod doesn't require a PA. $30 for 1 month supply (tested Dash & 5 G6)

## 2021-12-28 NOTE — Telephone Encounter (Signed)
Disregard. Pt has changed strength of Trulicity.  Patient Advocate Encounter   Received notification from CoverMyMeds that prior authorization for Trulicity1.'5mg'$  is required/requested.   PA submitted on 12/28/21 to [employer] via [place] Status is pending  Pharmacy Patient Advocate Fax:  [phone] Patient Advocate Encounter  Prior Authorization has been approved.    PA# PA Case ID: [id number], [id number] Effective dates: 11/28/21 through 12/25/22  Per Test Claim Patients co-pay is $30.

## 2022-01-01 ENCOUNTER — Other Ambulatory Visit (HOSPITAL_COMMUNITY): Payer: Self-pay

## 2022-01-11 ENCOUNTER — Other Ambulatory Visit: Payer: Self-pay

## 2022-01-11 DIAGNOSIS — E7800 Pure hypercholesterolemia, unspecified: Secondary | ICD-10-CM

## 2022-01-11 MED ORDER — EZETIMIBE 10 MG PO TABS: ORAL_TABLET | ORAL | 3 refills | Status: DC

## 2022-01-12 ENCOUNTER — Ambulatory Visit (INDEPENDENT_AMBULATORY_CARE_PROVIDER_SITE_OTHER): Payer: 59

## 2022-01-12 ENCOUNTER — Other Ambulatory Visit: Payer: Self-pay | Admitting: Obstetrics & Gynecology

## 2022-01-12 DIAGNOSIS — Z780 Asymptomatic menopausal state: Secondary | ICD-10-CM | POA: Diagnosis not present

## 2022-01-12 DIAGNOSIS — M810 Age-related osteoporosis without current pathological fracture: Secondary | ICD-10-CM

## 2022-01-12 DIAGNOSIS — Z13820 Encounter for screening for osteoporosis: Secondary | ICD-10-CM | POA: Diagnosis not present

## 2022-02-07 ENCOUNTER — Other Ambulatory Visit: Payer: Self-pay | Admitting: Endocrinology

## 2022-02-07 DIAGNOSIS — E1165 Type 2 diabetes mellitus with hyperglycemia: Secondary | ICD-10-CM

## 2022-02-10 ENCOUNTER — Encounter: Payer: Self-pay | Admitting: Family Medicine

## 2022-02-10 ENCOUNTER — Ambulatory Visit (INDEPENDENT_AMBULATORY_CARE_PROVIDER_SITE_OTHER): Payer: 59

## 2022-02-10 ENCOUNTER — Ambulatory Visit: Payer: 59 | Admitting: Family Medicine

## 2022-02-10 VITALS — BP 150/86 | HR 93 | Temp 97.6°F | Ht 66.0 in | Wt 145.7 lb

## 2022-02-10 DIAGNOSIS — S82832A Other fracture of upper and lower end of left fibula, initial encounter for closed fracture: Secondary | ICD-10-CM

## 2022-02-10 DIAGNOSIS — Z760 Encounter for issue of repeat prescription: Secondary | ICD-10-CM | POA: Diagnosis not present

## 2022-02-10 DIAGNOSIS — M25562 Pain in left knee: Secondary | ICD-10-CM

## 2022-02-10 MED ORDER — ALPRAZOLAM 0.5 MG PO TABS: ORAL_TABLET | ORAL | 0 refills | Status: DC

## 2022-02-10 NOTE — Patient Instructions (Signed)
Try to keep off leg as much as possible    Let me know if have not heard back from ortho in couple of days.
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
## 2022-02-25 ENCOUNTER — Ambulatory Visit: Payer: 59 | Admitting: Family Medicine

## 2022-02-25 ENCOUNTER — Encounter: Payer: Self-pay | Admitting: Family Medicine

## 2022-02-25 ENCOUNTER — Telehealth: Payer: Self-pay | Admitting: Family Medicine

## 2022-02-25 VITALS — BP 140/88 | HR 85 | Temp 98.1°F | Wt 137.4 lb

## 2022-02-25 DIAGNOSIS — J4520 Mild intermittent asthma, uncomplicated: Secondary | ICD-10-CM | POA: Diagnosis not present

## 2022-02-25 DIAGNOSIS — E1165 Type 2 diabetes mellitus with hyperglycemia: Secondary | ICD-10-CM | POA: Insufficient documentation

## 2022-02-25 DIAGNOSIS — I10 Essential (primary) hypertension: Secondary | ICD-10-CM

## 2022-02-25 DIAGNOSIS — Z789 Other specified health status: Secondary | ICD-10-CM | POA: Diagnosis not present

## 2022-02-25 DIAGNOSIS — K766 Portal hypertension: Secondary | ICD-10-CM

## 2022-02-25 DIAGNOSIS — E785 Hyperlipidemia, unspecified: Secondary | ICD-10-CM

## 2022-02-25 DIAGNOSIS — F411 Generalized anxiety disorder: Secondary | ICD-10-CM | POA: Diagnosis not present

## 2022-02-25 DIAGNOSIS — K7460 Unspecified cirrhosis of liver: Secondary | ICD-10-CM

## 2022-02-25 DIAGNOSIS — K754 Autoimmune hepatitis: Secondary | ICD-10-CM

## 2022-02-25 DIAGNOSIS — K219 Gastro-esophageal reflux disease without esophagitis: Secondary | ICD-10-CM

## 2022-02-25 DIAGNOSIS — G4700 Insomnia, unspecified: Secondary | ICD-10-CM

## 2022-02-25 DIAGNOSIS — K3189 Other diseases of stomach and duodenum: Secondary | ICD-10-CM

## 2022-02-25 DIAGNOSIS — Z760 Encounter for issue of repeat prescription: Secondary | ICD-10-CM

## 2022-02-25 DIAGNOSIS — D500 Iron deficiency anemia secondary to blood loss (chronic): Secondary | ICD-10-CM

## 2022-02-25 DIAGNOSIS — D649 Anemia, unspecified: Secondary | ICD-10-CM

## 2022-02-25 DIAGNOSIS — Z794 Long term (current) use of insulin: Secondary | ICD-10-CM

## 2022-02-25 DIAGNOSIS — Z0000 Encounter for general adult medical examination without abnormal findings: Secondary | ICD-10-CM | POA: Diagnosis not present

## 2022-02-25 LAB — BASIC METABOLIC PANEL
BUN: 11 mg/dL (ref 6–23)
CO2: 30 mEq/L (ref 19–32)
Calcium: 9.2 mg/dL (ref 8.4–10.5)
Chloride: 98 mEq/L (ref 96–112)
Creatinine, Ser: 0.66 mg/dL (ref 0.40–1.20)
GFR: 89.24 mL/min (ref >60.00)
Glucose, Bld: 132 mg/dL — ABNORMAL HIGH (ref 70–99)
Potassium: 4 mEq/L (ref 3.5–5.1)
Sodium: 137 mEq/L (ref 135–145)

## 2022-02-25 LAB — HEPATIC FUNCTION PANEL
ALT: 36 U/L — ABNORMAL HIGH (ref 0–35)
AST: 35 U/L (ref 0–37)
Albumin: 3.7 g/dL (ref 3.5–5.2)
Alkaline Phosphatase: 114 U/L (ref 39–117)
Bilirubin, Direct: 0.1 mg/dL (ref 0.0–0.3)
Total Bilirubin: 0.7 mg/dL (ref 0.2–1.2)
Total Protein: 7.3 g/dL (ref 6.0–8.3)

## 2022-02-25 LAB — CBC WITH DIFFERENTIAL/PLATELET
Basophils Absolute: 0 10*3/uL (ref 0.0–0.1)
Basophils Relative: 0.5 % (ref 0.0–3.0)
Eosinophils Absolute: 0.2 10*3/uL (ref 0.0–0.7)
Eosinophils Relative: 3.8 % (ref 0.0–5.0)
HCT: 33.1 % — ABNORMAL LOW (ref 36.0–46.0)
Hemoglobin: 10.5 g/dL — ABNORMAL LOW (ref 12.0–15.0)
Lymphocytes Relative: 36.1 % (ref 12.0–46.0)
Lymphs Abs: 2.2 10*3/uL (ref 0.7–4.0)
MCHC: 31.5 g/dL (ref 30.0–36.0)
MCV: 82.1 fl (ref 78.0–100.0)
Monocytes Absolute: 0.6 10*3/uL (ref 0.1–1.0)
Monocytes Relative: 10 % (ref 3.0–12.0)
Neutro Abs: 3 10*3/uL (ref 1.4–7.7)
Neutrophils Relative %: 49.6 % (ref 43.0–77.0)
Platelets: 140 10*3/uL — ABNORMAL LOW (ref 150.0–400.0)
RBC: 4.04 Mil/uL (ref 3.87–5.11)
RDW: 17.8 % — ABNORMAL HIGH (ref 11.5–15.5)
WBC: 6.1 10*3/uL (ref 4.0–10.5)

## 2022-02-25 LAB — HEMOGLOBIN A1C: Hgb A1c MFr Bld: 7.6 % — ABNORMAL HIGH (ref 4.6–6.5)

## 2022-02-25 LAB — LIPID PANEL
Cholesterol: 195 mg/dL (ref 0–200)
HDL: 52.7 mg/dL (ref >39.00)
LDL Cholesterol: 113 mg/dL — ABNORMAL HIGH (ref 0–99)
NonHDL: 142.18
Total CHOL/HDL Ratio: 4
Triglycerides: 147 mg/dL (ref 0.0–149.0)
VLDL: 29.4 mg/dL (ref 0.0–40.0)

## 2022-02-25 LAB — TSH: TSH: 2.53 u[IU]/mL (ref 0.35–5.50)

## 2022-02-25 MED ORDER — ALPRAZOLAM 0.5 MG PO TABS: ORAL_TABLET | ORAL | 1 refills | Status: DC

## 2022-02-25 NOTE — Telephone Encounter (Signed)
Pt seen today, not sure if it was discussed to refill her medications. Calling to ensure refills are sent for meds discussed today
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
## 2022-02-26 NOTE — Telephone Encounter (Signed)
The only thing that needed a refill was the Xanax, and yes I refilled that

## 2022-02-26 NOTE — Telephone Encounter (Signed)
Spoke with patient, message given.    

## 2022-03-01 NOTE — Addendum Note (Signed)
Addended by: Alysia Penna A on: 03/01/2022 07:55 AM   Modules accepted: Orders

## 2022-03-09 ENCOUNTER — Ambulatory Visit: Payer: 59 | Admitting: Endocrinology

## 2022-03-16 ENCOUNTER — Other Ambulatory Visit (INDEPENDENT_AMBULATORY_CARE_PROVIDER_SITE_OTHER): Payer: 59

## 2022-03-16 DIAGNOSIS — D649 Anemia, unspecified: Secondary | ICD-10-CM | POA: Diagnosis not present

## 2022-03-16 LAB — FOLATE: Folate: >23.9 ng/mL (ref >5.9)

## 2022-03-16 LAB — IBC + FERRITIN
Ferritin: 15.8 ng/mL (ref 10.0–291.0)
Iron: 50 ug/dL (ref 42–145)
Saturation Ratios: 10.2 % — ABNORMAL LOW (ref 20.0–50.0)
TIBC: 491.4 ug/dL — ABNORMAL HIGH (ref 250.0–450.0)
Transferrin: 351 mg/dL (ref 212.0–360.0)

## 2022-03-16 LAB — VITAMIN B12: Vitamin B-12: 466 pg/mL (ref 211–911)

## 2022-03-16 LAB — IRON: Iron: 50 ug/dL (ref 42–145)

## 2022-03-17 ENCOUNTER — Other Ambulatory Visit: Payer: Self-pay

## 2022-03-17 ENCOUNTER — Other Ambulatory Visit: Payer: Self-pay | Admitting: Nurse Practitioner

## 2022-03-17 ENCOUNTER — Other Ambulatory Visit (HOSPITAL_COMMUNITY): Payer: Self-pay | Admitting: Nurse Practitioner

## 2022-03-17 DIAGNOSIS — K7469 Other cirrhosis of liver: Secondary | ICD-10-CM

## 2022-03-17 DIAGNOSIS — D649 Anemia, unspecified: Secondary | ICD-10-CM

## 2022-03-17 MED ORDER — IRON (FERROUS SULFATE) 325 (65 FE) MG PO TABS: 325.0000 mg | ORAL_TABLET | Freq: Two times a day (BID) | ORAL | 3 refills | Status: DC

## 2022-03-29 ENCOUNTER — Ambulatory Visit (HOSPITAL_BASED_OUTPATIENT_CLINIC_OR_DEPARTMENT_OTHER)
Admission: RE | Admit: 2022-03-29 | Discharge: 2022-03-29 | Disposition: A | Discharge status: Home / Self Care | Payer: 59 | Source: Ambulatory Visit | Attending: Nurse Practitioner | Admitting: Nurse Practitioner

## 2022-03-29 DIAGNOSIS — K7469 Other cirrhosis of liver: Secondary | ICD-10-CM | POA: Diagnosis present

## 2022-03-31 ENCOUNTER — Other Ambulatory Visit: Payer: Self-pay | Admitting: Endocrinology

## 2022-05-07 ENCOUNTER — Other Ambulatory Visit: Payer: Self-pay | Admitting: Internal Medicine

## 2022-05-07 ENCOUNTER — Other Ambulatory Visit: Payer: Self-pay | Admitting: Family Medicine

## 2022-05-07 DIAGNOSIS — R52 Pain, unspecified: Secondary | ICD-10-CM

## 2022-05-07 DIAGNOSIS — K219 Gastro-esophageal reflux disease without esophagitis: Secondary | ICD-10-CM

## 2022-05-20 ENCOUNTER — Other Ambulatory Visit: Payer: Self-pay

## 2022-05-20 DIAGNOSIS — E7800 Pure hypercholesterolemia, unspecified: Secondary | ICD-10-CM

## 2022-05-20 MED ORDER — EZETIMIBE 10 MG PO TABS: ORAL_TABLET | ORAL | 3 refills | Status: DC

## 2022-06-14 ENCOUNTER — Other Ambulatory Visit: Payer: Self-pay | Admitting: Endocrinology

## 2022-06-14 DIAGNOSIS — E1165 Type 2 diabetes mellitus with hyperglycemia: Secondary | ICD-10-CM

## 2022-06-23 ENCOUNTER — Other Ambulatory Visit (HOSPITAL_COMMUNITY)
Admission: RE | Admit: 2022-06-23 | Discharge: 2022-06-23 | Disposition: A | Discharge status: Home / Self Care | Payer: 59 | Source: Ambulatory Visit | Attending: Obstetrics & Gynecology | Admitting: Obstetrics & Gynecology

## 2022-06-23 ENCOUNTER — Encounter: Payer: Self-pay | Admitting: Obstetrics & Gynecology

## 2022-06-23 ENCOUNTER — Ambulatory Visit (INDEPENDENT_AMBULATORY_CARE_PROVIDER_SITE_OTHER): Payer: 59 | Admitting: Obstetrics & Gynecology

## 2022-06-23 VITALS — BP 104/74 | HR 91

## 2022-06-23 DIAGNOSIS — B3731 Acute candidiasis of vulva and vagina: Secondary | ICD-10-CM | POA: Diagnosis not present

## 2022-06-23 DIAGNOSIS — R87810 Cervical high risk human papillomavirus (HPV) DNA test positive: Secondary | ICD-10-CM | POA: Diagnosis present

## 2022-06-23 DIAGNOSIS — N952 Postmenopausal atrophic vaginitis: Secondary | ICD-10-CM | POA: Diagnosis not present

## 2022-06-23 DIAGNOSIS — N870 Mild cervical dysplasia: Secondary | ICD-10-CM | POA: Diagnosis present

## 2022-06-23 MED ORDER — FLUCONAZOLE 150 MG PO TABS: 150.0000 mg | ORAL_TABLET | ORAL | 0 refills | Status: AC

## 2022-06-23 NOTE — Progress Notes (Signed)
    Miranda Cochran [birth year] [id number]        72 y.o.  G0P0000 Married  RP: 6 month repeat Pap for CIN 1/LGSIL  HPI: Colpo 06/2021 showing mild dysplasia/CIN 1.  Repeat Pap 11/2021 LGSIL/HPV HR Positive.  Postmenopausal on no HRT.  Very mild spotting after IC last night.  Mild vaginal discomfort.  Very high blood sugars recently, managing with Fam MD and Dr Deeann Dowse.  No PMB otherwise.  Pelvic US 06/2021 Endometrial line thin normal at 2.8 mm.     OB History  Gravida Para Term Preterm AB Living  0 0 0 0 0 0  SAB IAB Ectopic Multiple Live Births  0 0 0 0 0    Past medical history,surgical history, problem list, medications, allergies, family history and social history were all reviewed and documented in the EPIC chart.   Directed ROS with pertinent positives and negatives documented in the history of present illness/assessment and plan.  Exam:  Vitals:   06/23/22 1036  BP: 104/74  Pulse: 91  SpO2: 99%   General appearance:  Normal  Gynecologic exam: Vulva with yeasty discharge.  Speculum:  Cervix wnl, no blood at the EO.  Pap reflex done.  Upper fornix of the vagina very irritated, red, probably secondary to IC last night.  Bimanual exam:  Uterus AV, mobile, normal volume, NT.  No adnexal mass, NT.   Assessment/Plan:  72 y.o. G0P0000   1. Mild cervical dysplasia, histologically confirmed Colpo 06/2021 showing mild dysplasia/CIN 1.  Repeat Pap 11/2021 LGSIL/HPV HR Positive.  Pap reflex done today.  Management per result. - Cytology - PAP( [facility])  2. Cervical high risk human papillomavirus (HPV) DNA test positive - Cytology - PAP( [facility])  3. Yeast vaginitis Postmenopausal on no HRT.  Very mild spotting after IC last night.  Mild vaginal discomfort.  Very high blood sugars recently, managing with Fam MD and Dr Deeann Dowse.  Yeasty discharge at the vulva and vagina.  Will treat with Fluconazole.  Usage reviewed.  Prescription sent to pharmacy.  4.  Postmenopausal atrophic vaginitis Postmenopausal on no HRT.  Very mild spotting after IC last night.  Mild vaginal discomfort.  Postcoital spotting probably d/t postmenopausal atrophic vaginitis with yeast vaginitis.  Will treat the yeast vaginitis and recommend Coconut oil with IC. No PMB otherwise.  Pelvic US 06/2021 Endometrial line thin normal at 2.8 mm.  PMB precautions thoroughly reviewed, call back to repeat a Pelvic US if recurrence of vaginal spotting.  Other orders - BAQSIMI ONE PACK 3 MG/DOSE POWD; Place into both nostrils. - Cholecalciferol (VITAMIN D-3 PO); Take by mouth. - Cyanocobalamin (B-12 PO); Take by mouth. - Multiple Vitamin (MULTIVITAMIN PO); Take by mouth. - fluconazole (DIFLUCAN) 150 MG tablet; Take 1 tablet (150 mg total) by mouth every other day for 3 doses.   [name], 10:41 AM 06/23/2022

## 2022-06-28 LAB — CYTOLOGY - PAP
Comment: NEGATIVE
Diagnosis: UNDETERMINED — AB
High risk HPV: POSITIVE — AB

## 2022-06-29 ENCOUNTER — Other Ambulatory Visit: Payer: Self-pay

## 2022-06-29 DIAGNOSIS — R87610 Atypical squamous cells of undetermined significance on cytologic smear of cervix (ASC-US): Secondary | ICD-10-CM

## 2022-08-03 ENCOUNTER — Encounter: Payer: Self-pay | Admitting: Family Medicine

## 2022-08-03 ENCOUNTER — Ambulatory Visit: Payer: 59 | Admitting: Family Medicine

## 2022-08-03 VITALS — BP 138/90 | HR 82 | Temp 98.4°F | Wt 144.0 lb

## 2022-08-03 DIAGNOSIS — J019 Acute sinusitis, unspecified: Secondary | ICD-10-CM

## 2022-08-03 DIAGNOSIS — Z760 Encounter for issue of repeat prescription: Secondary | ICD-10-CM | POA: Diagnosis not present

## 2022-08-03 MED ORDER — ALPRAZOLAM 0.5 MG PO TABS: ORAL_TABLET | ORAL | 1 refills | Status: DC

## 2022-08-03 MED ORDER — AZITHROMYCIN 250 MG PO TABS: ORAL_TABLET | ORAL | 0 refills | Status: DC

## 2022-08-03 NOTE — Progress Notes (Signed)
   Subjective:    [name], female    DOB: 14-Jun-1951, 72 y.o.   MRN: [id number]  HPI Here for 2 weeks of sinus congestion, PND, and a dry cough. No fever or ST.    Review of Systems  Constitutional: Negative.   HENT:  Positive for congestion, postnasal drip and sinus pressure. Negative for ear pain and sore throat.   Eyes: Negative.   Respiratory:  Positive for cough. Negative for shortness of breath and wheezing.        Objective:   Physical Exam Constitutional:      Appearance: Normal appearance.  HENT:     Right Ear: Tympanic membrane, ear canal and external ear normal.     Left Ear: Tympanic membrane, ear canal and external ear normal.     Nose: Nose normal.     Mouth/Throat:     Pharynx: Oropharynx is clear.  Eyes:     Conjunctiva/sclera: Conjunctivae normal.  Pulmonary:     Effort: Pulmonary effort is normal.     Breath sounds: Normal breath sounds.  Lymphadenopathy:     Cervical: No cervical adenopathy.  Neurological:     Mental Status: She is alert.           Assessment & Plan:  Sinusitis, treat with a Zpack.  Alysia Penna, MD

## 2022-08-20 ENCOUNTER — Ambulatory Visit: Payer: Medicare Other | Admitting: Obstetrics & Gynecology

## 2022-09-16 LAB — HM DIABETES EYE EXAM

## 2022-09-28 ENCOUNTER — Encounter: Payer: Self-pay | Admitting: Obstetrics & Gynecology

## 2022-09-28 ENCOUNTER — Ambulatory Visit (INDEPENDENT_AMBULATORY_CARE_PROVIDER_SITE_OTHER): Payer: 59 | Admitting: Obstetrics & Gynecology

## 2022-09-28 VITALS — BP 118/70 | HR 85

## 2022-09-28 DIAGNOSIS — N950 Postmenopausal bleeding: Secondary | ICD-10-CM | POA: Diagnosis not present

## 2022-09-28 DIAGNOSIS — N766 Ulceration of vulva: Secondary | ICD-10-CM

## 2022-09-28 DIAGNOSIS — N898 Other specified noninflammatory disorders of vagina: Secondary | ICD-10-CM

## 2022-09-28 LAB — WET PREP FOR TRICH, YEAST, CLUE

## 2022-09-28 MED ORDER — TINIDAZOLE 500 MG PO TABS: 1000.0000 mg | ORAL_TABLET | Freq: Two times a day (BID) | ORAL | 0 refills | Status: AC

## 2022-09-28 MED ORDER — CLOBETASOL PROPIONATE 0.05 % EX OINT: 1.0000 | TOPICAL_OINTMENT | Freq: Two times a day (BID) | CUTANEOUS | 0 refills | Status: AC

## 2022-09-28 NOTE — Progress Notes (Signed)
    Miranda Cochran 02-27-1951 [id number]        72 y.o.  G0 Married x 4 years  RP: Vaginal burning, swelling, spotting   HPI: Pt c/o vaginal burning, swelling, spotting. No pelvic pain.  No fever.  Tried Neosporin cream without improvement.  Sexually active with husband.  No known STI except HPV HR Pos.  No h/o of genital HSV per patient.  H/O Auto-immune Hepatitis.   OB History  Gravida Para Term Preterm AB Living  0 0 0 0 0 0  SAB IAB Ectopic Multiple Live Births  0 0 0 0 0    Past medical history,surgical history, problem list, medications, allergies, family history and social history were all reviewed and documented in the EPIC chart.   Directed ROS with pertinent positives and negatives documented in the history of present illness/assessment and plan.  Exam:   General appearance:  Normal  Gynecologic exam: Vulva inflamed with 2 ulcers at the external Rt labia minora and 1 ulcer at the external Lt labia minora.  Smaller ulcers at the posterior fourchette.  Wet prep: Clue cells present with odor   Assessment/Plan:  72 y.o. G0P0000   1. Vulvar ulceration -Pt c/o vaginal burning, swelling, spotting. No pelvic pain.  No fever.  Tried Neosporin cream without improvement.  Sexually active with husband.  No known STI except HPV HR Pos.  No h/o of genital HSV per patient.  H/O Auto-immune Hepatitis.  R/O genital Herpes, pending Sure Swab HSV.  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  Will attempt treatment with Clobetasol ointment.  Usage reviewed, prescription sent to pharmacy. - SureSwab HSV, Type 1/2 DNA, PCR  2. Vagina itching BV confirmed by wet prep.  Will treat with Tinidazole.  Usage reviewed, prescription sent. - WET PREP FOR TRICH, YEAST, CLUE  3. Vaginal discharge BV present.  Sure Swab Advanced Plus pending. - WET PREP FOR TRICH, YEAST, CLUE - SureSwab Advanced Vaginitis Plus,TMA  4. Postmenopausal bleeding Light PMB probably d/t vulvar ulcers.  Will  r/o endometrial pathology with a Pelvic US. - US Transvaginal Non-OB; Future  Other orders - insulin degludec (TRESIBA) 100 UNIT/ML FlexTouch Pen; Inject into the skin. - Semaglutide 3 MG TABS; Take by mouth. (Patient not taking: Reported on 09/28/2022) - TRULICITY 4.5 MG/0.5ML SOPN; SMARTSIG:4.5 Milligram(s) SUB-Q Once a Week - insulin lispro (HUMALOG) 100 UNIT/ML KwikPen; Humalog 20 units twice daily with meals - lisinopril (ZESTRIL) 5 MG tablet; Take 1 tablet by mouth daily. - tinidazole (TINDAMAX) 500 MG tablet; Take 2 tablets (1,000 mg total) by mouth 2 (two) times daily for 2 days. - clobetasol ointment (TEMOVATE) 0.05 %; Apply 1 Application topically 2 (two) times daily for 14 days.   Genia Del MD, 12:14 PM 09/28/2022

## 2022-09-29 LAB — SURESWAB® ADVANCED VAGINITIS PLUS,TMA
C. trachomatis RNA, TMA: NOT DETECTED
CANDIDA SPECIES: DETECTED — AB
Candida glabrata: DETECTED — AB
N. gonorrhoeae RNA, TMA: NOT DETECTED
SURESWAB(R) ADV BACTERIAL VAGINOSIS(BV),TMA: POSITIVE — AB
TRICHOMONAS VAGINALIS (TV),TMA: NOT DETECTED

## 2022-09-30 ENCOUNTER — Other Ambulatory Visit: Payer: Self-pay | Admitting: *Deleted

## 2022-09-30 MED ORDER — FLUCONAZOLE 150 MG PO TABS: 150.0000 mg | ORAL_TABLET | ORAL | 0 refills | Status: DC

## 2022-10-01 LAB — SURESWAB HSV, TYPE 1/2 DNA, PCR
HSV 1 DNA: NOT DETECTED
HSV 2 DNA: DETECTED — AB

## 2022-10-12 ENCOUNTER — Other Ambulatory Visit: Payer: Self-pay

## 2022-10-12 DIAGNOSIS — E7800 Pure hypercholesterolemia, unspecified: Secondary | ICD-10-CM

## 2022-10-13 ENCOUNTER — Other Ambulatory Visit: Payer: Self-pay | Admitting: Family Medicine

## 2022-10-13 DIAGNOSIS — Z1231 Encounter for screening mammogram for malignant neoplasm of breast: Secondary | ICD-10-CM

## 2022-10-18 ENCOUNTER — Ambulatory Visit: Payer: 59

## 2022-10-25 ENCOUNTER — Other Ambulatory Visit (HOSPITAL_BASED_OUTPATIENT_CLINIC_OR_DEPARTMENT_OTHER): Payer: Self-pay | Admitting: Nurse Practitioner

## 2022-10-25 DIAGNOSIS — K754 Autoimmune hepatitis: Secondary | ICD-10-CM

## 2022-10-25 DIAGNOSIS — K7469 Other cirrhosis of liver: Secondary | ICD-10-CM

## 2022-10-26 ENCOUNTER — Telehealth: Payer: Self-pay

## 2022-10-26 NOTE — Telephone Encounter (Signed)
Refill request for Ezetimibe 10 mg

## 2022-10-31 ENCOUNTER — Ambulatory Visit (HOSPITAL_BASED_OUTPATIENT_CLINIC_OR_DEPARTMENT_OTHER)
Admission: RE | Admit: 2022-10-31 | Discharge: 2022-10-31 | Disposition: A | Discharge status: Home / Self Care | Payer: 59 | Source: Ambulatory Visit | Attending: Nurse Practitioner | Admitting: Nurse Practitioner

## 2022-10-31 DIAGNOSIS — K754 Autoimmune hepatitis: Secondary | ICD-10-CM | POA: Diagnosis present

## 2022-10-31 DIAGNOSIS — K7469 Other cirrhosis of liver: Secondary | ICD-10-CM | POA: Diagnosis present

## 2022-11-04 ENCOUNTER — Other Ambulatory Visit: Payer: Medicare Other

## 2022-11-04 ENCOUNTER — Other Ambulatory Visit: Payer: 59 | Admitting: Obstetrics & Gynecology

## 2022-11-13 ENCOUNTER — Other Ambulatory Visit: Payer: Self-pay | Admitting: Family Medicine

## 2022-11-24 ENCOUNTER — Ambulatory Visit: Payer: 59

## 2022-12-21 ENCOUNTER — Encounter: Payer: Self-pay | Admitting: Obstetrics & Gynecology

## 2022-12-21 ENCOUNTER — Other Ambulatory Visit (HOSPITAL_COMMUNITY)
Admission: RE | Admit: 2022-12-21 | Discharge: 2022-12-21 | Disposition: A | Discharge status: Home / Self Care | Payer: 59 | Source: Ambulatory Visit | Attending: Obstetrics & Gynecology | Admitting: Obstetrics & Gynecology

## 2022-12-21 ENCOUNTER — Ambulatory Visit (INDEPENDENT_AMBULATORY_CARE_PROVIDER_SITE_OTHER): Payer: 59 | Admitting: Obstetrics & Gynecology

## 2022-12-21 VITALS — BP 128/86 | HR 87 | Ht 64.0 in | Wt 142.0 lb

## 2022-12-21 DIAGNOSIS — Z124 Encounter for screening for malignant neoplasm of cervix: Secondary | ICD-10-CM

## 2022-12-21 DIAGNOSIS — R87810 Cervical high risk human papillomavirus (HPV) DNA test positive: Secondary | ICD-10-CM | POA: Insufficient documentation

## 2022-12-21 DIAGNOSIS — B009 Herpesviral infection, unspecified: Secondary | ICD-10-CM | POA: Diagnosis not present

## 2022-12-21 DIAGNOSIS — Z9189 Other specified personal risk factors, not elsewhere classified: Secondary | ICD-10-CM | POA: Diagnosis not present

## 2022-12-21 DIAGNOSIS — N870 Mild cervical dysplasia: Secondary | ICD-10-CM

## 2022-12-21 DIAGNOSIS — Z01419 Encounter for gynecological examination (general) (routine) without abnormal findings: Secondary | ICD-10-CM

## 2022-12-21 DIAGNOSIS — M810 Age-related osteoporosis without current pathological fracture: Secondary | ICD-10-CM

## 2022-12-21 DIAGNOSIS — R87610 Atypical squamous cells of undetermined significance on cytologic smear of cervix (ASC-US): Secondary | ICD-10-CM

## 2022-12-21 DIAGNOSIS — Z8619 Personal history of other infectious and parasitic diseases: Secondary | ICD-10-CM

## 2022-12-21 DIAGNOSIS — Z780 Asymptomatic menopausal state: Secondary | ICD-10-CM

## 2022-12-21 NOTE — Progress Notes (Signed)
Miranda Cochran 1951/02/17 [id number]   History:    72 y.o.  G0 Married               RP:  Established patient presenting for annual gyn exam    HPI: Postmenopause, well on no HRT.  No PMB.  No pelvic pain. Sexually active, no pain with IC.  No abnormal vaginal d/c. Abnormal Paps since 07/2019 HPV HR Positive. 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  Pap ASCUS/HPV HR Pos 06/2022. Pap/HPV HR today. H/O Herpes genitalis.  No recurrence x Dx in 09/2022. Breasts normal.  Mammo Neg 08/2021. Schedule Mammo now. Urine/BMs normal.  BMI 24.37.  Good fitness.  Health labs with Fam MD.  Alen Bleacher 07/2020.  BD 01/2022 Osteoporosis Lt Femoral Neck T-Score -3.6. On Vit D supplement.    Past medical history,surgical history, family history and social history were all reviewed and documented in the EPIC chart.  Gynecologic History No LMP recorded. Patient is postmenopausal.  Obstetric History OB History  Gravida Para Term Preterm AB Living  0 0 0 0 0 0  SAB IAB Ectopic Multiple Live Births  0 0 0 0 0     ROS: A ROS was performed and pertinent positives and negatives are included in the history. GENERAL: No fevers or chills. HEENT: No change in vision, no earache, sore throat or sinus congestion. NECK: No pain or stiffness. CARDIOVASCULAR: No chest pain or pressure. No palpitations. PULMONARY: No shortness of breath, cough or wheeze. GASTROINTESTINAL: No abdominal pain, nausea, vomiting or diarrhea, melena or bright red blood per rectum. GENITOURINARY: No urinary frequency, urgency, hesitancy or dysuria. MUSCULOSKELETAL: No joint or muscle pain, no back pain, no recent trauma. DERMATOLOGIC: No rash, no itching, no lesions. ENDOCRINE: No polyuria, polydipsia, no heat or cold intolerance. No recent change in weight. HEMATOLOGICAL: No anemia or easy bruising or bleeding. NEUROLOGIC: No headache, seizures, numbness, tingling or weakness. PSYCHIATRIC: No depression, no loss of interest in normal activity or change in sleep  pattern.     Exam:   BP 128/86 (BP Location: Right Arm, Patient Position: Sitting, Cuff Size: Normal)   Pulse 87   Ht 5\' 4"  (1.626 m)   Wt 142 lb (64.4 kg)   SpO2 97%   BMI 24.37 kg/m   Body mass index is 24.37 kg/m.  General appearance : Well developed well nourished female. No acute distress HEENT: Eyes: no retinal hemorrhage or exudates,  Neck supple, trachea midline, no carotid bruits, no thyroidmegaly Lungs: Clear to auscultation, no rhonchi or wheezes, or rib retractions  Heart: Regular rate and rhythm, no murmurs or gallops Breast:Examined in sitting and supine position were symmetrical in appearance, no palpable masses or tenderness,  no skin retraction, no nipple inversion, no nipple discharge, no skin discoloration, no axillary or supraclavicular lymphadenopathy Abdomen: no palpable masses or tenderness, no rebound or guarding Extremities: no edema or skin discoloration or tenderness  Pelvic: Vulva: Normal             Vagina: No gross lesions or discharge  Cervix: No gross lesions or discharge.  Pap/HPV HR done.  Uterus  AV, normal size, shape and consistency, non-tender and mobile  Adnexa  Without masses or tenderness  Anus: Normal   Assessment/Plan:  72 y.o. female for annual exam   1. Encounter for routine gynecological examination with Papanicolaou smear of cervix Postmenopause, well on no HRT.  No PMB.  No pelvic pain. Sexually active, no pain with IC.  No abnormal vaginal  d/c. Abnormal Paps since 07/2019 HPV HR Positive. 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  Pap ASCUS/HPV HR Pos 06/2022. Pap/HPV HR today. H/O Herpes genitalis.  No recurrence x Dx in 09/2022. Breasts normal.  Mammo Neg 08/2021. Schedule Mammo now. Urine/BMs normal.  BMI 24.37.  Good fitness.  Health labs with Fam MD.  Alen Bleacher 07/2020.  BD 01/2022 Osteoporosis Lt Femoral Neck T-Score -3.6. On Vit D supplement. - Cytology - PAP( [facility])  2. Mild cervical dysplasia, histologically confirmed - Cytology  - PAP( [facility])  3. ASCUS with positive high risk HPV cervical - Cytology - PAP( [facility])  4. Postmenopause Postmenopause, well on no HRT.  No PMB.  No pelvic pain. Sexually active, no pain with IC.  No abnormal vaginal d/c.   5. Age-related osteoporosis without current pathological fracture BD 01/2022 Osteoporosis Lt Femoral Neck T-Score -3.6. On Vit D supplement. Counseling on Osteoporosis.  High risk of fractures discussed.  Given patient's GERD with a Hiatal hernia, would not tolerated Bisphosphonate therapy.  Will start on Prolia.  Risks/Benefits and usage reviewed.  6. H/O herpes genitalis No recurrence x initial Dx in 09/2022.  Other orders - RYBELSUS 14 MG TABS; Take 1 tablet by mouth every morning. - tretinoin (RETIN-A) 0.025 % cream; Apply topically at bedtime.   Genia Del MD, 10:46 AM

## 2022-12-24 LAB — CYTOLOGY - PAP
Comment: NEGATIVE
High risk HPV: POSITIVE — AB

## 2022-12-27 NOTE — Addendum Note (Signed)
Addended by: Melrose Nakayama on: 12/27/2022 10:47 AM   Modules accepted: Orders

## 2022-12-29 ENCOUNTER — Telehealth: Payer: Self-pay | Admitting: *Deleted

## 2022-12-29 NOTE — Telephone Encounter (Signed)
-----   Message from [facility] Rosette Reveal sent at 12/22/2022  2:04 PM EDT ----- Regarding: FW: Start on Prolia  ----- Message ----- From: Genia Del, MD Sent: 12/21/2022  11:11 AM EDT To: [facility] Triage Subject: Start on Prolia                                Osteoporosis Lt Femoral Neck T-Score -3.6.  Patient has GERD/Hiatal Hernia, cannot take Fosamax or other Bisphosphonate.

## 2023-01-03 NOTE — Telephone Encounter (Signed)
Submitted to [employer] for review of Prolia benefits.

## 2023-01-05 NOTE — Telephone Encounter (Signed)
PA required. Submitted to [employer].  Prior Auth ID# [id number]

## 2023-01-11 ENCOUNTER — Encounter: Payer: Self-pay | Admitting: Obstetrics and Gynecology

## 2023-01-14 NOTE — Telephone Encounter (Signed)
Pre-determination required.  Faxed to [employer] at [phone]

## 2023-01-17 NOTE — Telephone Encounter (Signed)
Left message to call Noreene Larsson, RN at [place], [phone], option 4 regarding Prolia.   Prolia is covered at 100% Administration subject to $50 co-pay No deductible or coinsurance applies. Copay contributes to [facility] Max $3000 ($546.98 met)     Fax response received from [employer] Prolia PA approved. Valid 01/14/23 -04/16/23 Auth # [id number]

## 2023-01-24 ENCOUNTER — Telehealth: Payer: Self-pay | Admitting: Obstetrics and Gynecology

## 2023-01-24 NOTE — Telephone Encounter (Signed)
Dr Edward Jolly placed order for colpo for patient to schedule. Left message on July 17 & 23 and mailed letter on July 30 for patient to call and schedule appointment. Patient has not called back.

## 2023-01-28 NOTE — Telephone Encounter (Signed)
Left message to call Noreene Larsson, RN at [place] [facility], [phone], option 5.   Patient also has open encounter for colposcopy -see telephone encounter dated 01/24/23.

## 2023-01-28 NOTE — Telephone Encounter (Signed)
Left message to call Jill, RN at [facility], [phone], option 5.  

## 2023-02-17 IMAGING — US US ABDOMEN LIMITED
1 series · 14 of 25 positions shown · non-contrast
Comparison: Ultrasound abdomen 07/28/2020

CLINICAL DATA: Autoimmune hepatitis. Cirrhosis on biopsy. Screen
for HCC.

EXAM:
ULTRASOUND ABDOMEN LIMITED RIGHT UPPER QUADRANT

[Series 1: us abdomen limited · 14 of 63 slices shown]
[im 1/63]
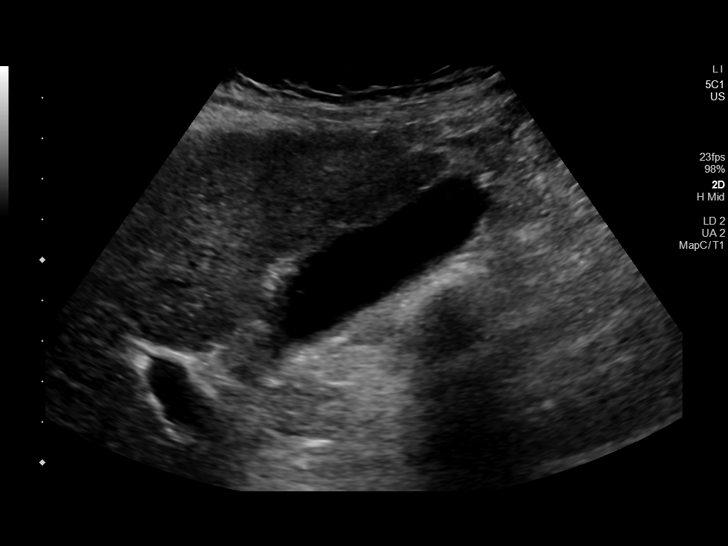
[im 6/63]
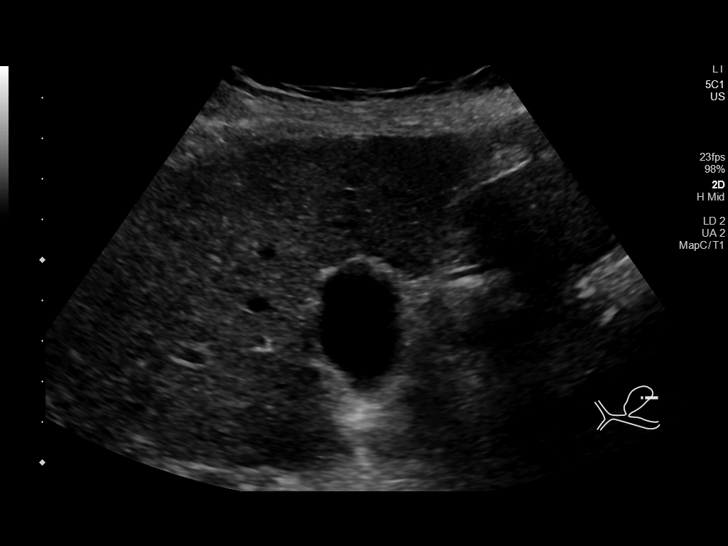
[im 11/63]
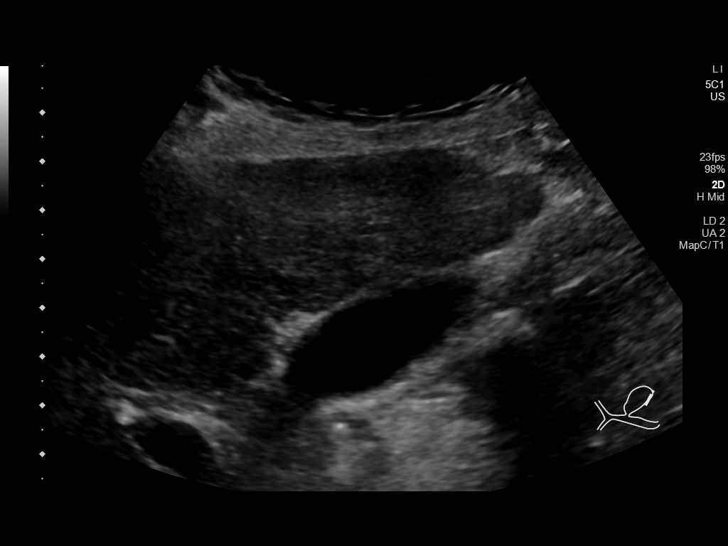
[im 16/63]
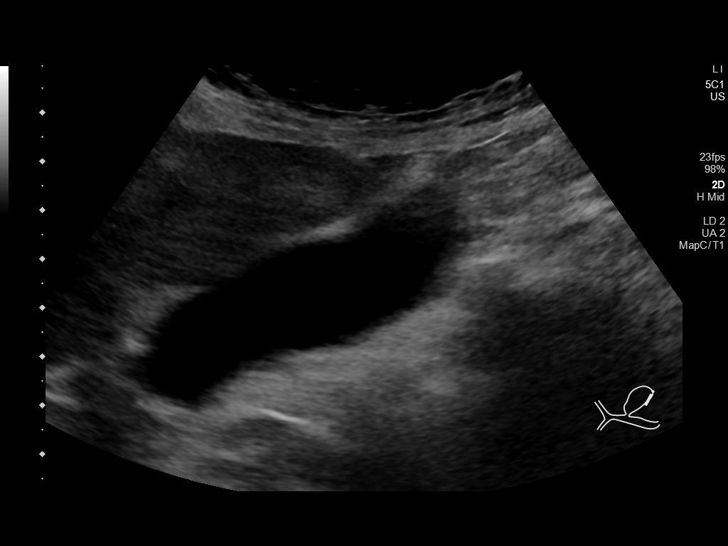
[im 21/63]
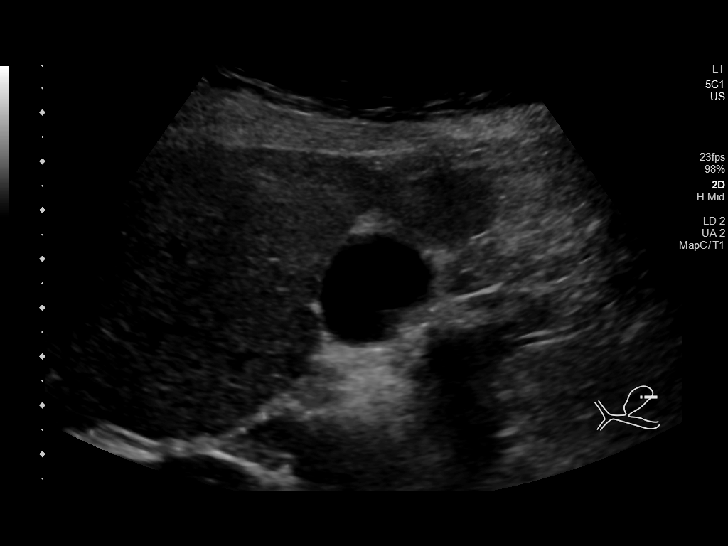
[im 24/63]
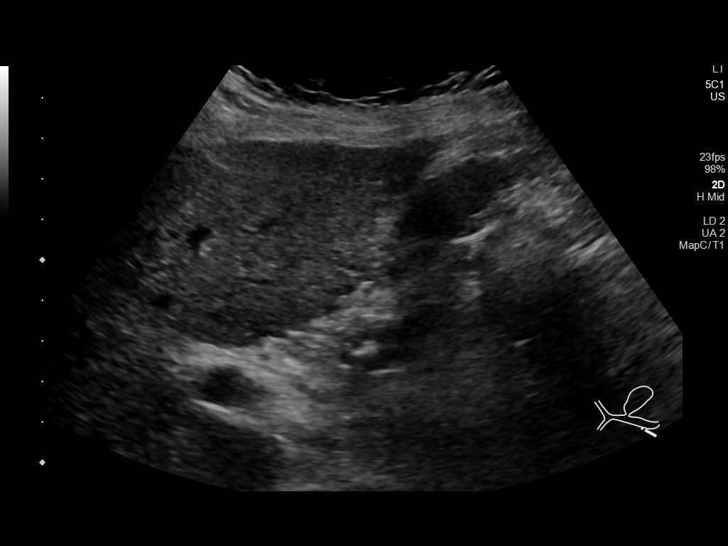
[im 29/63]
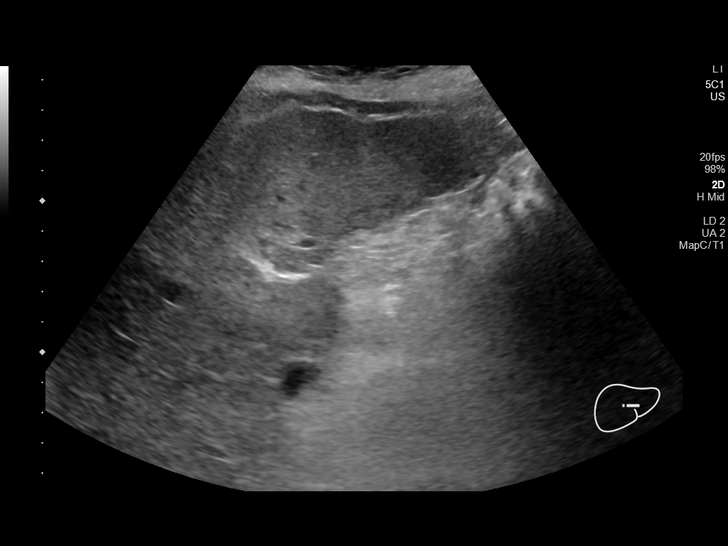
[im 34/63]
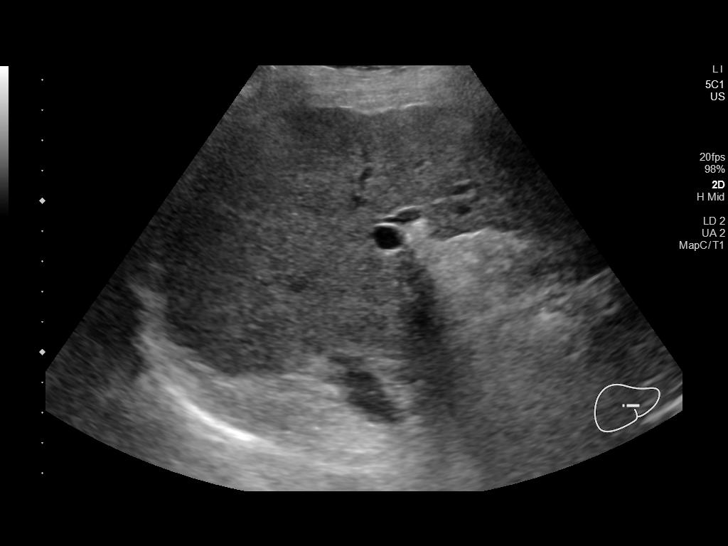
[im 39/63]
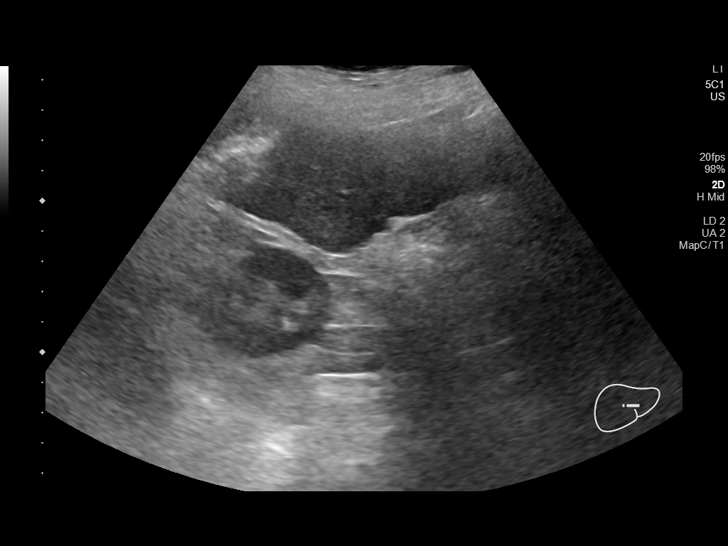
[im 42/63]
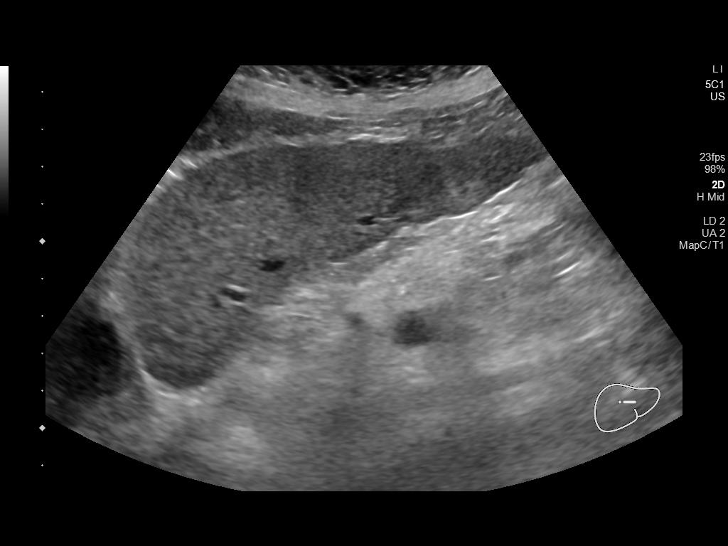
[im 47/63]
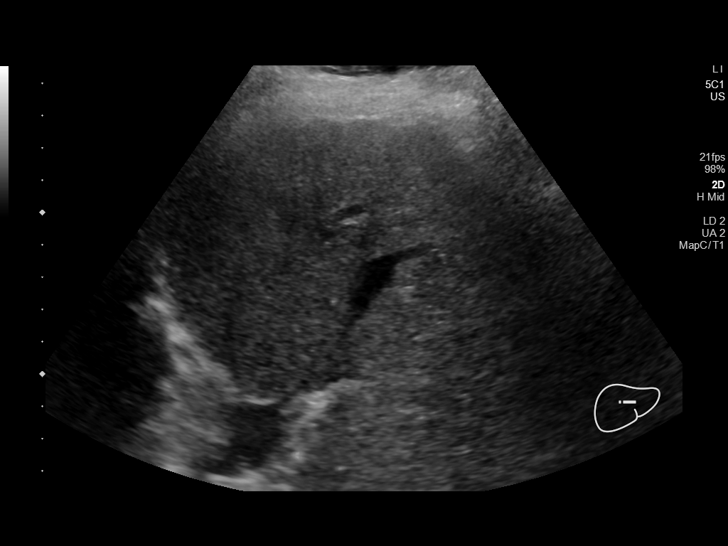
[im 52/63]
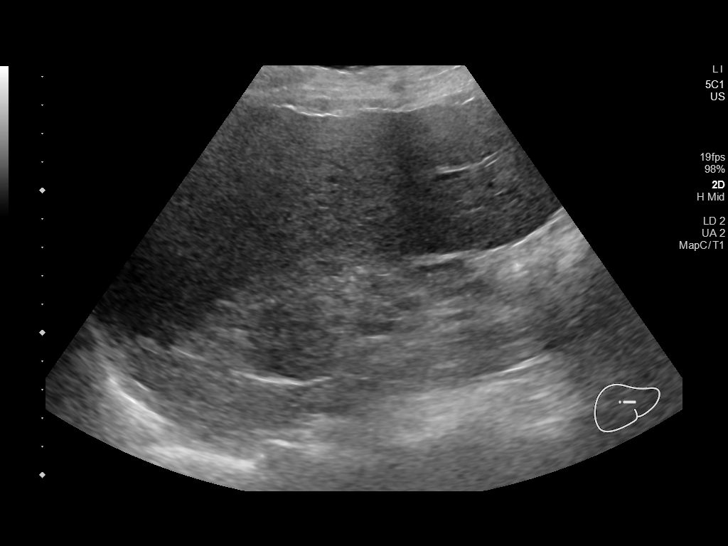
[im 57/63]
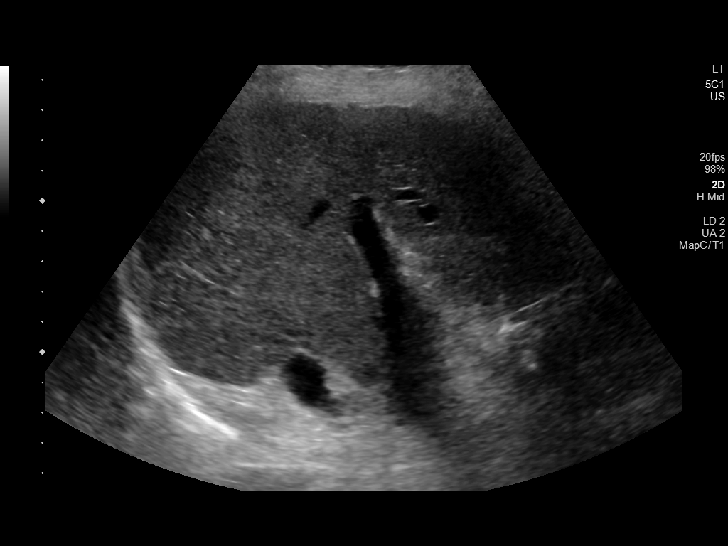
[im 63/63]
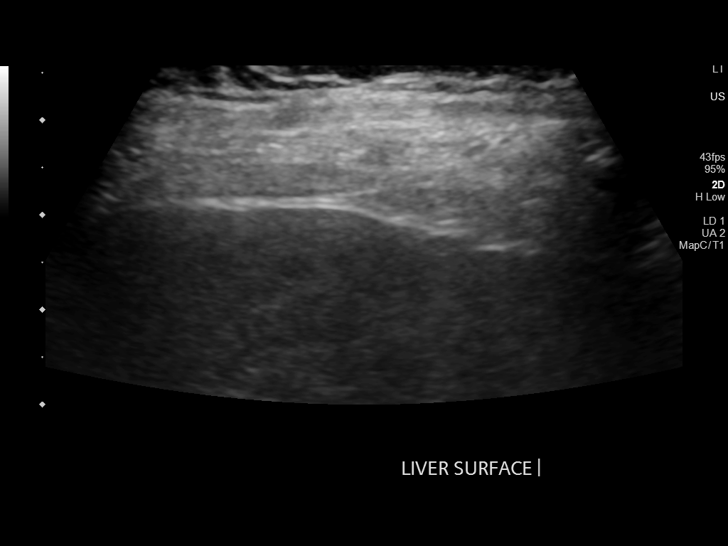

[14 of 25 positions shown; findings below may reference images not displayed]

FINDINGS: Gallbladder:

No gallstones or wall thickening visualized. No sonographic Murphy
sign noted by sonographer.

Common bile duct:

Diameter: 2.9 mm

Liver:

Increased echogenicity liver diffusely with coarse echotexture.
Nodular contour of the liver diffusely. No focal mass lesion. Portal
vein is patent on color Doppler imaging with normal direction of
blood flow towards the liver.

Other: Negative for ascites
IMPRESSION: Findings compatible with cirrhosis liver.  No liver mass.

Negative for gallstones.

## 2023-03-09 NOTE — Telephone Encounter (Signed)
MyChart message to patient.

## 2023-03-09 NOTE — Telephone Encounter (Signed)
Encounter closed.   See encounter dated 01/24/23.

## 2023-03-31 ENCOUNTER — Other Ambulatory Visit: Payer: Self-pay | Admitting: Obstetrics & Gynecology

## 2023-03-31 DIAGNOSIS — N762 Acute vulvitis: Secondary | ICD-10-CM

## 2023-03-31 NOTE — Telephone Encounter (Signed)
Med refill request: clobetasol ointment Last AEX: 12/21/22 Next AEX: none Overdue for Colposcopy, no appointment is scheduled Last MMG (if hormonal med) n/a Refill sent to provider for approval or denial.

## 2023-04-01 ENCOUNTER — Telehealth: Payer: Self-pay | Admitting: Obstetrics and Gynecology

## 2023-04-01 NOTE — Telephone Encounter (Signed)
Please send patient formal letter recommending colposcopy.   She had an abnormal pap and positive HR HPV in January and in July with Dr. Seymour Bars.   I noticed this when I received a request for refill of Clobetasol, which I am going to deny.  This refill is not appropriate.  She needs office visit to determine why this is needed.  Originally prescribed for vulvar irritation and ulceration in April, 2024.  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  I do not see a prescription for antiviral medication.

## 2023-04-05 NOTE — Telephone Encounter (Signed)
Letter pended, routing to Dr. Edward Jolly for review.

## 2023-04-05 NOTE — Telephone Encounter (Signed)
See telephone encounter dated 01/24/23.   Letter to Dr. Edward Jolly. Encounter closed.

## 2023-04-06 NOTE — Telephone Encounter (Signed)
Letter signed and given to in office triage.

## 2023-04-06 NOTE — Telephone Encounter (Signed)
Letter signed by provider and mailed

## 2023-04-11 NOTE — Telephone Encounter (Signed)
Letter sent via MyChart and mail.   Encounter closed.

## 2023-04-21 ENCOUNTER — Other Ambulatory Visit (HOSPITAL_BASED_OUTPATIENT_CLINIC_OR_DEPARTMENT_OTHER): Payer: Self-pay | Admitting: Nurse Practitioner

## 2023-04-21 DIAGNOSIS — K7469 Other cirrhosis of liver: Secondary | ICD-10-CM

## 2023-04-21 DIAGNOSIS — K754 Autoimmune hepatitis: Secondary | ICD-10-CM

## 2023-04-22 ENCOUNTER — Encounter: Payer: Self-pay | Admitting: Family Medicine

## 2023-04-22 ENCOUNTER — Ambulatory Visit: Payer: 59 | Admitting: Family Medicine

## 2023-04-22 ENCOUNTER — Ambulatory Visit: Payer: 59

## 2023-04-22 VITALS — BP 118/68 | HR 87 | Temp 98.5°F | Wt 134.8 lb

## 2023-04-22 DIAGNOSIS — B3789 Other sites of candidiasis: Secondary | ICD-10-CM | POA: Diagnosis not present

## 2023-04-22 DIAGNOSIS — Z760 Encounter for issue of repeat prescription: Secondary | ICD-10-CM | POA: Diagnosis not present

## 2023-04-22 DIAGNOSIS — M545 Low back pain, unspecified: Secondary | ICD-10-CM

## 2023-04-22 DIAGNOSIS — K219 Gastro-esophageal reflux disease without esophagitis: Secondary | ICD-10-CM | POA: Diagnosis not present

## 2023-04-22 DIAGNOSIS — R52 Pain, unspecified: Secondary | ICD-10-CM

## 2023-04-22 DIAGNOSIS — M25551 Pain in right hip: Secondary | ICD-10-CM

## 2023-04-22 MED ORDER — KETOCONAZOLE 2 % EX CREA: 1.0000 | TOPICAL_CREAM | Freq: Every day | CUTANEOUS | 2 refills | Status: DC

## 2023-04-22 MED ORDER — ALPRAZOLAM 0.5 MG PO TABS: ORAL_TABLET | ORAL | 1 refills | Status: DC

## 2023-04-22 MED ORDER — VENLAFAXINE HCL ER 75 MG PO CP24: ORAL_CAPSULE | ORAL | 3 refills | Status: DC

## 2023-04-22 MED ORDER — FUROSEMIDE 40 MG PO TABS: ORAL_TABLET | ORAL | 3 refills | Status: DC

## 2023-04-22 MED ORDER — LANSOPRAZOLE 30 MG PO CPDR: DELAYED_RELEASE_CAPSULE | ORAL | 3 refills | Status: DC
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
## 2023-04-26 ENCOUNTER — Ambulatory Visit (HOSPITAL_BASED_OUTPATIENT_CLINIC_OR_DEPARTMENT_OTHER): Payer: 59

## 2023-04-27 ENCOUNTER — Telehealth: Payer: Self-pay | Admitting: *Deleted

## 2023-04-27 NOTE — Telephone Encounter (Signed)
Call returned to patient. Patient received letter with colpo recommendations. Patient initially agreeable to schedule colpo and then declined. Patient states she would just like to schedule her next pap in 6 months. I recommended OV with Dr. Edward Jolly to further discuss previous results and recommendations. Patient request to schedule in January. OV scheduled for 1/14 at 1400 for consult and PAP is appropriate. Advised I will forward to Dr. Edward Jolly for final review and return call if any additional recommendations. Patient agreeable.   Routing to provider for final review. Patient is agreeable to disposition. Will close encounter.

## 2023-04-29 ENCOUNTER — Ambulatory Visit (HOSPITAL_BASED_OUTPATIENT_CLINIC_OR_DEPARTMENT_OTHER): Admission: RE | Admit: 2023-04-29 | Payer: 59 | Source: Ambulatory Visit

## 2023-04-29 ENCOUNTER — Encounter (HOSPITAL_BASED_OUTPATIENT_CLINIC_OR_DEPARTMENT_OTHER): Payer: Self-pay

## 2023-05-05 NOTE — Telephone Encounter (Signed)
Eye exam found in Care Everywhere. Results abstracted and provider added to Care Teams.

## 2023-05-13 ENCOUNTER — Ambulatory Visit (HOSPITAL_BASED_OUTPATIENT_CLINIC_OR_DEPARTMENT_OTHER)
Admission: RE | Admit: 2023-05-13 | Discharge: 2023-05-13 | Disposition: A | Discharge status: Home / Self Care | Payer: 59 | Source: Ambulatory Visit | Attending: Family Medicine | Admitting: Family Medicine

## 2023-05-13 ENCOUNTER — Ambulatory Visit (HOSPITAL_BASED_OUTPATIENT_CLINIC_OR_DEPARTMENT_OTHER)
Admission: RE | Admit: 2023-05-13 | Discharge: 2023-05-13 | Disposition: A | Discharge status: Home / Self Care | Payer: 59 | Source: Ambulatory Visit | Attending: Nurse Practitioner | Admitting: Nurse Practitioner

## 2023-05-13 ENCOUNTER — Encounter (HOSPITAL_BASED_OUTPATIENT_CLINIC_OR_DEPARTMENT_OTHER): Payer: Self-pay | Admitting: Radiology

## 2023-05-13 DIAGNOSIS — Z1231 Encounter for screening mammogram for malignant neoplasm of breast: Secondary | ICD-10-CM | POA: Diagnosis present

## 2023-05-13 DIAGNOSIS — K754 Autoimmune hepatitis: Secondary | ICD-10-CM | POA: Diagnosis present

## 2023-05-13 DIAGNOSIS — K7469 Other cirrhosis of liver: Secondary | ICD-10-CM | POA: Diagnosis present

## 2023-05-24 ENCOUNTER — Encounter: Payer: Medicare Other | Admitting: Obstetrics and Gynecology

## 2023-05-25 ENCOUNTER — Telehealth: Payer: Self-pay | Admitting: Family Medicine

## 2023-05-25 NOTE — Telephone Encounter (Signed)
Pt is calling and would like to see dr fry for follow up on her back pain. Pt is aware md does not have open until Friday and was offer an appt to see another  provider.  Pt wants cortisone injection and requesting nancy to return her call

## 2023-05-26 NOTE — Telephone Encounter (Signed)
Spoke with pt stated that she went to UC and had injections which are helping her back pain. Pt stated that she will f/u with Dr Clent Ridges if she don't get better.

## 2023-05-31 ENCOUNTER — Telehealth: Payer: Self-pay | Admitting: Family Medicine

## 2023-05-31 NOTE — Telephone Encounter (Signed)
Pt called to F/U on this request, stating she is still in a lot of pain.

## 2023-05-31 NOTE — Telephone Encounter (Signed)
Prescription Request  05/31/2023  LOV: 04/22/2023  What is the name of the medication or equipment? Pt is requesting Oxycodone for severe back pain  Have you contacted your pharmacy to request a refill? No   Which pharmacy would you like this sent to?   [id number] [place], [place] - [address] AT [facility] [address] [facility] [address] Phone: [phone]  Fax: [phone]       Patient notified that their request is being sent to the clinical staff for review and that they should receive a response within 2 business days.   Please advise at Mobile [phone] (mobile)

## 2023-06-01 ENCOUNTER — Other Ambulatory Visit (HOSPITAL_COMMUNITY): Payer: Self-pay | Admitting: Chiropractor

## 2023-06-01 DIAGNOSIS — M5441 Lumbago with sciatica, right side: Secondary | ICD-10-CM

## 2023-06-01 NOTE — Telephone Encounter (Signed)
She needs an OV. I cannot prescribe narcotic medications without evaluating her

## 2023-06-01 NOTE — Telephone Encounter (Signed)
Called Pt to schedule OV. Pt stated she could not take the pain, so she went to the UC.  Pt said she would call to schedule a F/U, when she is feeling better.

## 2023-06-05 ENCOUNTER — Ambulatory Visit (HOSPITAL_BASED_OUTPATIENT_CLINIC_OR_DEPARTMENT_OTHER)
Admission: RE | Admit: 2023-06-05 | Discharge: 2023-06-05 | Disposition: A | Discharge status: Home / Self Care | Payer: 59 | Source: Ambulatory Visit | Attending: Chiropractor | Admitting: Chiropractor

## 2023-06-05 DIAGNOSIS — M5441 Lumbago with sciatica, right side: Secondary | ICD-10-CM | POA: Insufficient documentation

## 2023-06-07 ENCOUNTER — Ambulatory Visit (HOSPITAL_BASED_OUTPATIENT_CLINIC_OR_DEPARTMENT_OTHER): Payer: 59

## 2023-06-14 NOTE — Progress Notes (Deleted)
 GYNECOLOGY  VISIT   HPI: 72 y.o.   Married  Caucasian female   G0P0000 with No LMP recorded. Patient is postmenopausal.   here for: pap and consult     GYNECOLOGIC HISTORY: No LMP recorded. Patient is postmenopausal. Contraception:  PMP Menopausal hormone therapy:  n/a Last 2 paps:  12/21/22 LSIL: HR HPV positive, 06/23/22 ASCUS: HR HPV positive History of abnormal Pap or positive HPV:  yes Mammogram:  05/13/23 Breast Density Cat C, BI-RADS CAT 1 neg        OB History     Gravida  0   Para  0   Term  0   Preterm  0   AB  0   Living  0      SAB  0   IAB  0   Ectopic  0   Multiple  0   Live Births  0              Patient Active Problem List   Diagnosis Date Noted   Uncontrolled diabetes mellitus with hyperglycemia, with long-term current use of insulin  (HCC) 02/25/2022   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   Cellulitis of right lower leg 04/07/2021   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 08/29/2020   Alcohol use 08/29/2020   Family history of colon cancer in father dx 22 and died 48 w/ second cancer 09-01-2019   Insomnia 06/11/2019   Asthma 06/11/2019   Venous (peripheral) insufficiency 04/12/2018   Long term (current) use of systemic steroids 11/02/2017   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 08/25/2016   Autoimmune hepatitis treated with steroids (HCC) 08/18/2016   Sinusitis, chronic    Hypertension    Hyperlipidemia    Vitamin D deficiency    Thyroid  nodule 04/29/2011   Hx of adenomatous polyp of colon 07/07/2009   IRRITABLE BOWEL SYNDROME 10/26/2007   Anxiety state 10/24/2007   GERD 10/24/2007    Past Medical History:  Diagnosis Date   Abnormal Pap smear of cervix    05-15-21 ascus hpv hr+   Allergy    Anxiety    on meds   Autoimmune hepatitis (HCC) 08/18/2016   08/2016 liver bx confirms, fibrosis not  cirrhosis   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 08/29/2020   COVID 06/27/2020   Depression    on meds   DM (diabetes mellitus) (HCC)    Fracture, fibula    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   GERD (gastroesophageal reflux disease)    on meds   Hiatal hernia    Hx of adenomatous polyp of colon 07/07/2009   Hyperlipidemia    diet controlled   Hypertension    on meds - LASIX    IBS (irritable bowel syndrome)    Iron  deficiency anemia    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 Avram, adenimatous polyps, repeat in 5 yrs   ESOPHAGOGASTRODUODENOSCOPY  07/06/2021  per Dr. Avram, showed portal hypertensive gastropathy, repeat in 2 yrs   ESOPHAGOGASTRODUODENOSCOPY (EGD) WITH PROPOFOL  N/A 06/20/2017   Procedure: ESOPHAGOGASTRODUODENOSCOPY (EGD) WITH PROPOFOL ;  Surgeon: Avram Lupita BRAVO, MD;  Location: [facility];  Service: Endoscopy;  Laterality: N/A;  with Barrx   EYE SURGERY     NASAL SEPTOPLASTY W/ TURBINOPLASTY     POLYPECTOMY  2015   CG-TA   TONSILLECTOMY      Current Outpatient Medications  Medication Sig Dispense Refill   ALPRAZolam  (XANAX ) 0.5 MG tablet TAKE 1 TABLET(0.5 MG) BY MOUTH AT BEDTIME AS NEEDED FOR SLEEP 90 tablet 1   AMBULATORY NON FORMULARY MEDICATION ACTIVE LIVER Artichoke leaf extract, Milk thistle, Turmeric Take 1 capsule by mouth once daily     aspirin  325 MG tablet Take 650-975 mg by mouth daily as needed for moderate pain or headache.      BAQSIMI ONE PACK 3 MG/DOSE POWD Place into both nostrils.     Blood Glucose Monitoring  Suppl (ONETOUCH VERIO FLEX SYSTEM) w/Device KIT Use Onetouch Verio Flex to check blood sugar once daily. 1 kit 0   cetirizine  (ZYRTEC ) 10 MG tablet Take 10 mg by mouth daily.     Cholecalciferol  (VITAMIN D-3 PO) Take by mouth.     Continuous Blood Gluc Sensor (FREESTYLE LIBRE 3 SENSOR) MISC APPLY 1 SENSOR TO SKIN, CHANGE EVERY 14 DAYS AS DIRECTED 2 each 2   Cyanocobalamin  (B-12 PO) Take by mouth.     cyclobenzaprine  (FLEXERIL ) 10 MG tablet TAKE 1 TABLET BY MOUTH EVERY 8 HOURS AS NEEDED FOR MUSCLE SPASMS WITH ONE BEING AT BEDTIME IF NEEDED 60 tablet 5   diphenhydrAMINE  (BENADRYL ) 25 mg capsule Take 25-50 mg by mouth daily as needed for allergies.      ezetimibe  (ZETIA ) 10 MG tablet TAKE 1 TABLET(10 MG) BY MOUTH DAILY 30 tablet 3   furosemide  (LASIX ) 40 MG tablet TAKE 1 TABLET(40 MG) BY MOUTH DAILY 90 tablet 3   insulin  degludec (TRESIBA) 100 UNIT/ML FlexTouch Pen Inject into the skin.     insulin  lispro (HUMALOG ) 100 UNIT/ML KwikPen Humalog  20 units twice daily with meals     ketoconazole  (NIZORAL ) 2 % cream Apply 1 Application topically daily. 30 g 2   Lancets (ONETOUCH DELICA PLUS LANCET33G) MISC USE AS DIRECTED UP TO 4 TIMES DAILY AS DIRECTED 100 each 0   lansoprazole  (PREVACID ) 30 MG capsule TAKE 1 CAPSULE(30 MG) BY MOUTH DAILY 90 capsule 3   lisinopril  (ZESTRIL ) 5 MG tablet Take 1 tablet by mouth daily.     Multiple Vitamin (MULTIVITAMIN PO) Take by mouth.     ONETOUCH VERIO test strip USE TO CHECK BLOOD SUGAR ONCE DAILY 100 strip 5   predniSONE  (DELTASONE ) 10 MG tablet Take 10 mg by mouth daily with breakfast.     RYBELSUS 14 MG TABS Take 1 tablet by mouth every morning.     sodium chloride  (BRONCHO SALINE ) inhaler solution Take 1 spray by nebulization as needed.     tretinoin (RETIN-A) 0.025 % cream Apply topically at bedtime.     venlafaxine  XR (EFFEXOR -XR) 75 MG 24 hr capsule TAKE 1 CAPSULE BY MOUTH TWICE DAILY WITH A MEAL 180 capsule 3   No current facility-administered medications  for this visit.     ALLERGIES: Fentanyl , Levofloxacin, Amoxicillin , Augmentin  [amoxicillin -pot clavulanate], Farxiga  [dapagliflozin ], and Statins  Family History  Problem Relation Age of Onset   Stroke Mother    Hypertension Mother    Colon cancer Father 68   Liver  cancer Father 65   Colon polyps Father 56   Heart disease Sister        hole in heart   Pancreatic cancer Maternal Aunt    Brain cancer Maternal Uncle    Stroke Maternal Grandmother    Hypertension Maternal Grandmother    Esophageal cancer Maternal Grandfather    Throat cancer Maternal Grandfather    Breast cancer Paternal Grandmother    Brain cancer Paternal Grandmother    Lung cancer Paternal Grandfather    Rectal cancer Neg Hx    Stomach cancer Neg Hx     Social History   Socioeconomic History   Marital status: Married    Spouse name: Not on file   Number of children: 0   Years of education: Not on file   Highest education level: Associate degree: occupational, scientist, product/process development, or vocational program  Occupational History   Occupation: Therapist, nutritional    Comment: [employer]  Tobacco Use   Smoking status: Never   Smokeless tobacco: Never  Vaping Use   Vaping status: Never Used  Substance and Sexual Activity   Alcohol use: Yes    Comment: champagne on weekends   Drug use: No   Sexual activity: Yes    Partners: Male    Birth control/protection: Post-menopausal  Other Topics Concern   Not on file  Social History Narrative   Divorced, no children   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[employer]   Social Drivers of Corporate Investment Banker Strain: Low Risk  (04/21/2023)   Overall Financial Resource Strain (CARDIA)    Difficulty of Paying Living Expenses: Not hard at all  Food Insecurity: No Food Insecurity (04/21/2023)   Hunger Vital Sign    Worried About Running Out of Food in the Last Year: Never true    Ran Out of Food in the Last Year: Never true  Transportation  Needs: No Transportation Needs (04/21/2023)   PRAPARE - Administrator, Civil Service (Medical): No    Lack of Transportation (Non-Medical): No  Physical Activity: Insufficiently Active (04/21/2023)   Exercise Vital Sign    Days of Exercise per Week: 4 days    Minutes of Exercise per Session: 20 min  Stress: Stress Concern Present (04/21/2023)   [employer] of Occupational Health - Occupational Stress Questionnaire    Feeling of Stress : To some extent  Social Connections: Moderately Integrated (04/21/2023)   Social Connection and Isolation Panel [NHANES]    Frequency of Communication with Friends and Family: Twice a week    Frequency of Social Gatherings with Friends and Family: Once a week    Attends Religious Services: 1 to 4 times per year    Active Member of [employer] or Organizations: No    Attends Engineer, Structural: Not on file    Marital Status: Married  Catering Manager Violence: Not on file    Review of Systems  PHYSICAL EXAMINATION:   There were no vitals taken for this visit.    General appearance: alert, cooperative and appears stated age Head: Normocephalic, without obvious abnormality, atraumatic Neck: no adenopathy, supple, symmetrical, trachea midline and thyroid  normal to inspection and palpation Lungs: clear to auscultation bilaterally Breasts: normal appearance, no masses or tenderness, No nipple retraction or dimpling, No nipple discharge or bleeding, No axillary or supraclavicular adenopathy Heart: regular rate and rhythm Abdomen: soft, non-tender, no masses,  no organomegaly Extremities: extremities normal, atraumatic, no cyanosis or edema Skin: Skin color, texture, turgor  normal. No rashes or lesions Lymph nodes: Cervical, supraclavicular, and axillary nodes normal. No abnormal inguinal nodes palpated Neurologic: Grossly normal  Pelvic: External genitalia:  no lesions              Urethra:  normal appearing urethra with no masses,  tenderness or lesions              Bartholins and Skenes: normal                 Vagina: normal appearing vagina with normal color and discharge, no lesions              Cervix: no lesions                Bimanual Exam:  Uterus:  normal size, contour, position, consistency, mobility, non-tender              Adnexa: no mass, fullness, tenderness              Rectal exam: {yes no:314532}.  Confirms.              Anus:  normal sphincter tone, no lesions  Chaperone was present for exam:  {BSCHAPERONE:[id number]::Kipton Skillen F, CMA}  ASSESSMENT:    PLAN:    {LABS (Optional):23779}  ***  total time was spent for this patient encounter, including preparation, face-to-face counseling with the patient, coordination of care, and documentation of the encounter.

## 2023-06-28 ENCOUNTER — Ambulatory Visit: Payer: Medicare Other | Admitting: Obstetrics and Gynecology

## 2023-07-04 ENCOUNTER — Encounter: Payer: Self-pay | Admitting: Internal Medicine

## 2023-07-06 NOTE — Progress Notes (Deleted)
 GYNECOLOGY  VISIT   HPI: 73 y.o.   Married  Caucasian female   G0P0000 with No LMP recorded. Patient is postmenopausal.   here for: repeat pap and consult     GYNECOLOGIC HISTORY: No LMP recorded. Patient is postmenopausal. Contraception:  PMP Menopausal hormone therapy:  n/a Last 2 paps:  12/21/22 LSIL: HR HPV positive, 06/23/22 ASCUS: HR HPV positive History of abnormal Pap or positive HPV:  yes Mammogram:  05/13/23 Breast Density Cat C, BI-RADS CAT 1 neg        OB History     Gravida  0   Para  0   Term  0   Preterm  0   AB  0   Living  0      SAB  0   IAB  0   Ectopic  0   Multiple  0   Live Births  0              Patient Active Problem List   Diagnosis Date Noted   Uncontrolled diabetes mellitus with hyperglycemia, with long-term current use of insulin  (HCC) 02/25/2022   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   Cellulitis of right lower leg 04/07/2021   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 08/29/2020   Alcohol use 08/29/2020   Family history of colon cancer in father dx 105 and died 31 w/ second cancer 08/28/19   Insomnia 06/11/2019   Asthma 06/11/2019   Venous (peripheral) insufficiency 04/12/2018   Long term (current) use of systemic steroids 11/02/2017   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 08/25/2016   Autoimmune hepatitis treated with steroids (HCC) 08/18/2016   Sinusitis, chronic    Hypertension    Hyperlipidemia    Vitamin D deficiency    Thyroid  nodule 04/29/2011   Hx of adenomatous polyp of colon 07/07/2009   IRRITABLE BOWEL SYNDROME 10/26/2007   Anxiety state 10/24/2007   GERD 10/24/2007    Past Medical History:  Diagnosis Date   Abnormal Pap smear of cervix    05-15-21 ascus hpv hr+   Allergy    Anxiety    on meds   Autoimmune hepatitis (HCC) 08/18/2016   08/2016 liver bx confirms, fibrosis  not cirrhosis   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 08/29/2020   COVID 06/27/2020   Depression    on meds   DM (diabetes mellitus) (HCC)    Fracture, fibula    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   GERD (gastroesophageal reflux disease)    on meds   Hiatal hernia    Hx of adenomatous polyp of colon 07/07/2009   Hyperlipidemia    diet controlled   Hypertension    on meds - LASIX    IBS (irritable bowel syndrome)    Iron  deficiency anemia    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 Avram, adenimatous polyps, repeat in 5 yrs   ESOPHAGOGASTRODUODENOSCOPY  07/06/2021  per Dr. Avram, showed portal hypertensive gastropathy, repeat in 2 yrs   ESOPHAGOGASTRODUODENOSCOPY (EGD) WITH PROPOFOL  N/A 06/20/2017   Procedure: ESOPHAGOGASTRODUODENOSCOPY (EGD) WITH PROPOFOL ;  Surgeon: Avram Lupita BRAVO, MD;  Location: [facility];  Service: Endoscopy;  Laterality: N/A;  with Barrx   EYE SURGERY     NASAL SEPTOPLASTY W/ TURBINOPLASTY     POLYPECTOMY  2015   CG-TA   TONSILLECTOMY      Current Outpatient Medications  Medication Sig Dispense Refill   ALPRAZolam  (XANAX ) 0.5 MG tablet TAKE 1 TABLET(0.5 MG) BY MOUTH AT BEDTIME AS NEEDED FOR SLEEP 90 tablet 1   AMBULATORY NON FORMULARY MEDICATION ACTIVE LIVER Artichoke leaf extract, Milk thistle, Turmeric Take 1 capsule by mouth once daily     aspirin  325 MG tablet Take 650-975 mg by mouth daily as needed for moderate pain or headache.      BAQSIMI ONE PACK 3 MG/DOSE POWD Place into both nostrils.     Blood Glucose Monitoring  Suppl (ONETOUCH VERIO FLEX SYSTEM) w/Device KIT Use Onetouch Verio Flex to check blood sugar once daily. 1 kit 0   cetirizine  (ZYRTEC ) 10 MG tablet Take 10 mg by mouth daily.     Cholecalciferol  (VITAMIN D-3 PO) Take by mouth.     Continuous Blood Gluc Sensor (FREESTYLE LIBRE 3 SENSOR) MISC APPLY 1 SENSOR TO SKIN, CHANGE EVERY 14 DAYS AS DIRECTED 2 each 2   Cyanocobalamin  (B-12 PO) Take by mouth.     cyclobenzaprine  (FLEXERIL ) 10 MG tablet TAKE 1 TABLET BY MOUTH EVERY 8 HOURS AS NEEDED FOR MUSCLE SPASMS WITH ONE BEING AT BEDTIME IF NEEDED 60 tablet 5   diphenhydrAMINE  (BENADRYL ) 25 mg capsule Take 25-50 mg by mouth daily as needed for allergies.      ezetimibe  (ZETIA ) 10 MG tablet TAKE 1 TABLET(10 MG) BY MOUTH DAILY 30 tablet 3   furosemide  (LASIX ) 40 MG tablet TAKE 1 TABLET(40 MG) BY MOUTH DAILY 90 tablet 3   insulin  degludec (TRESIBA) 100 UNIT/ML FlexTouch Pen Inject into the skin.     insulin  lispro (HUMALOG ) 100 UNIT/ML KwikPen Humalog  20 units twice daily with meals     ketoconazole  (NIZORAL ) 2 % cream Apply 1 Application topically daily. 30 g 2   Lancets (ONETOUCH DELICA PLUS LANCET33G) MISC USE AS DIRECTED UP TO 4 TIMES DAILY AS DIRECTED 100 each 0   lansoprazole  (PREVACID ) 30 MG capsule TAKE 1 CAPSULE(30 MG) BY MOUTH DAILY 90 capsule 3   lisinopril  (ZESTRIL ) 5 MG tablet Take 1 tablet by mouth daily.     Multiple Vitamin (MULTIVITAMIN PO) Take by mouth.     ONETOUCH VERIO test strip USE TO CHECK BLOOD SUGAR ONCE DAILY 100 strip 5   predniSONE  (DELTASONE ) 10 MG tablet Take 10 mg by mouth daily with breakfast.     RYBELSUS 14 MG TABS Take 1 tablet by mouth every morning.     sodium chloride  (BRONCHO SALINE ) inhaler solution Take 1 spray by nebulization as needed.     tretinoin (RETIN-A) 0.025 % cream Apply topically at bedtime.     venlafaxine  XR (EFFEXOR -XR) 75 MG 24 hr capsule TAKE 1 CAPSULE BY MOUTH TWICE DAILY WITH A MEAL 180 capsule 3   No current facility-administered medications  for this visit.     ALLERGIES: Fentanyl , Levofloxacin, Amoxicillin , Augmentin  [amoxicillin -pot clavulanate], Farxiga  [dapagliflozin ], and Statins  Family History  Problem Relation Age of Onset   Stroke Mother    Hypertension Mother    Colon cancer Father 80   Liver  cancer Father 73   Colon polyps Father 12   Heart disease Sister        hole in heart   Pancreatic cancer Maternal Aunt    Brain cancer Maternal Uncle    Stroke Maternal Grandmother    Hypertension Maternal Grandmother    Esophageal cancer Maternal Grandfather    Throat cancer Maternal Grandfather    Breast cancer Paternal Grandmother    Brain cancer Paternal Grandmother    Lung cancer Paternal Grandfather    Rectal cancer Neg Hx    Stomach cancer Neg Hx     Social History   Socioeconomic History   Marital status: Married    Spouse name: Not on file   Number of children: 0   Years of education: Not on file   Highest education level: Associate degree: occupational, scientist, product/process development, or vocational program  Occupational History   Occupation: Therapist, nutritional    Comment: [employer]  Tobacco Use   Smoking status: Never   Smokeless tobacco: Never  Vaping Use   Vaping status: Never Used  Substance and Sexual Activity   Alcohol use: Yes    Comment: champagne on weekends   Drug use: No   Sexual activity: Yes    Partners: Male    Birth control/protection: Post-menopausal  Other Topics Concern   Not on file  Social History Narrative   Divorced, no children   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[employer]   Social Drivers of Corporate Investment Banker Strain: Low Risk  (04/21/2023)   Overall Financial Resource Strain (CARDIA)    Difficulty of Paying Living Expenses: Not hard at all  Food Insecurity: No Food Insecurity (04/21/2023)   Hunger Vital Sign    Worried About Running Out of Food in the Last Year: Never true    Ran Out of Food in the Last Year: Never true  Transportation  Needs: No Transportation Needs (04/21/2023)   PRAPARE - Administrator, Civil Service (Medical): No    Lack of Transportation (Non-Medical): No  Physical Activity: Insufficiently Active (04/21/2023)   Exercise Vital Sign    Days of Exercise per Week: 4 days    Minutes of Exercise per Session: 20 min  Stress: Stress Concern Present (04/21/2023)   [employer] of Occupational Health - Occupational Stress Questionnaire    Feeling of Stress : To some extent  Social Connections: Moderately Integrated (04/21/2023)   Social Connection and Isolation Panel [NHANES]    Frequency of Communication with Friends and Family: Twice a week    Frequency of Social Gatherings with Friends and Family: Once a week    Attends Religious Services: 1 to 4 times per year    Active Member of [employer] or Organizations: No    Attends Engineer, Structural: Not on file    Marital Status: Married  Catering Manager Violence: Not on file    Review of Systems  PHYSICAL EXAMINATION:   There were no vitals taken for this visit.    General appearance: alert, cooperative and appears stated age Head: Normocephalic, without obvious abnormality, atraumatic Neck: no adenopathy, supple, symmetrical, trachea midline and thyroid  normal to inspection and palpation Lungs: clear to auscultation bilaterally Breasts: normal appearance, no masses or tenderness, No nipple retraction or dimpling, No nipple discharge or bleeding, No axillary or supraclavicular adenopathy Heart: regular rate and rhythm Abdomen: soft, non-tender, no masses,  no organomegaly Extremities: extremities normal, atraumatic, no cyanosis or edema Skin: Skin color, texture, turgor  normal. No rashes or lesions Lymph nodes: Cervical, supraclavicular, and axillary nodes normal. No abnormal inguinal nodes palpated Neurologic: Grossly normal  Pelvic: External genitalia:  no lesions              Urethra:  normal appearing urethra with no masses,  tenderness or lesions              Bartholins and Skenes: normal                 Vagina: normal appearing vagina with normal color and discharge, no lesions              Cervix: no lesions                Bimanual Exam:  Uterus:  normal size, contour, position, consistency, mobility, non-tender              Adnexa: no mass, fullness, tenderness              Rectal exam: {yes no:314532}.  Confirms.              Anus:  normal sphincter tone, no lesions  Chaperone was present for exam:  {BSCHAPERONE:[id number]::Chandrika Sandles F, CMA}  ASSESSMENT:    PLAN:    {LABS (Optional):23779}  ***  total time was spent for this patient encounter, including preparation, face-to-face counseling with the patient, coordination of care, and documentation of the encounter.

## 2023-07-20 ENCOUNTER — Ambulatory Visit: Payer: Medicare Other | Admitting: Obstetrics and Gynecology

## 2023-07-27 IMAGING — MG MM DIGITAL SCREENING BILAT W/ TOMO AND CAD
8 series · 8 of 24 positions shown · non-contrast
Comparison: Previous exam(s).

CLINICAL DATA: Screening.

EXAM:
DIGITAL SCREENING BILATERAL MAMMOGRAM WITH TOMOSYNTHESIS AND CAD
TECHNIQUE: Bilateral screening digital craniocaudal and mediolateral oblique
mammograms were obtained. Bilateral screening digital breast
tomosynthesis was performed. The images were evaluated with
computer-aided detection.

[R MLO synth-2D]
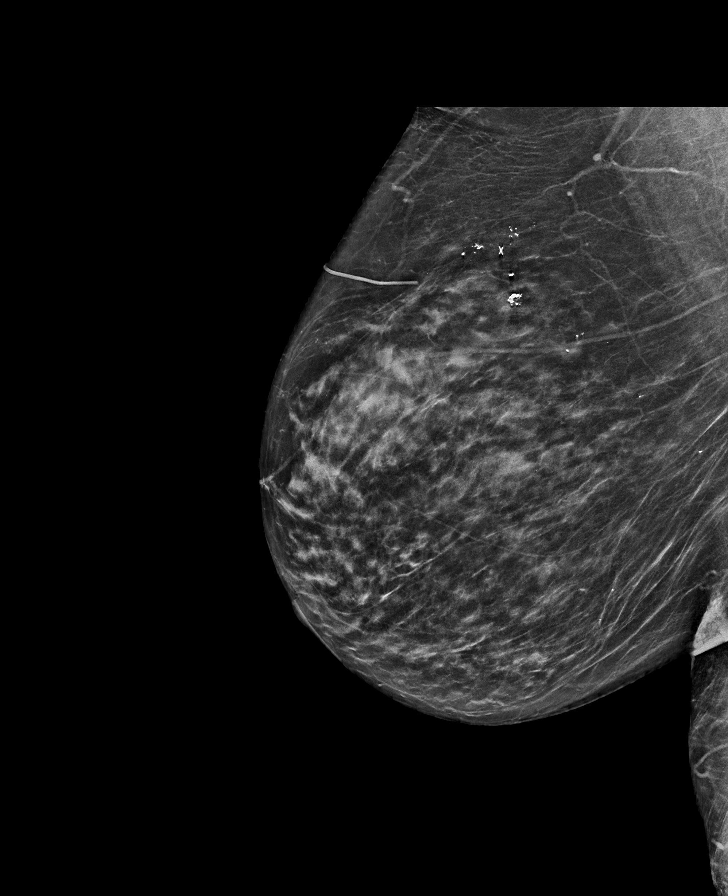

[L MLO synth-2D]
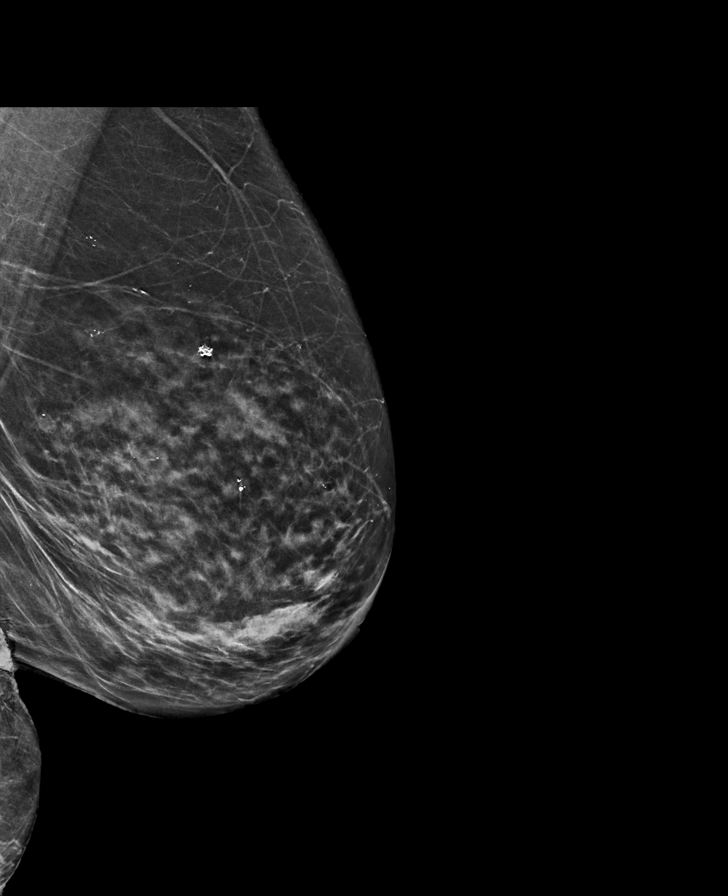

[L CC synth-2D]
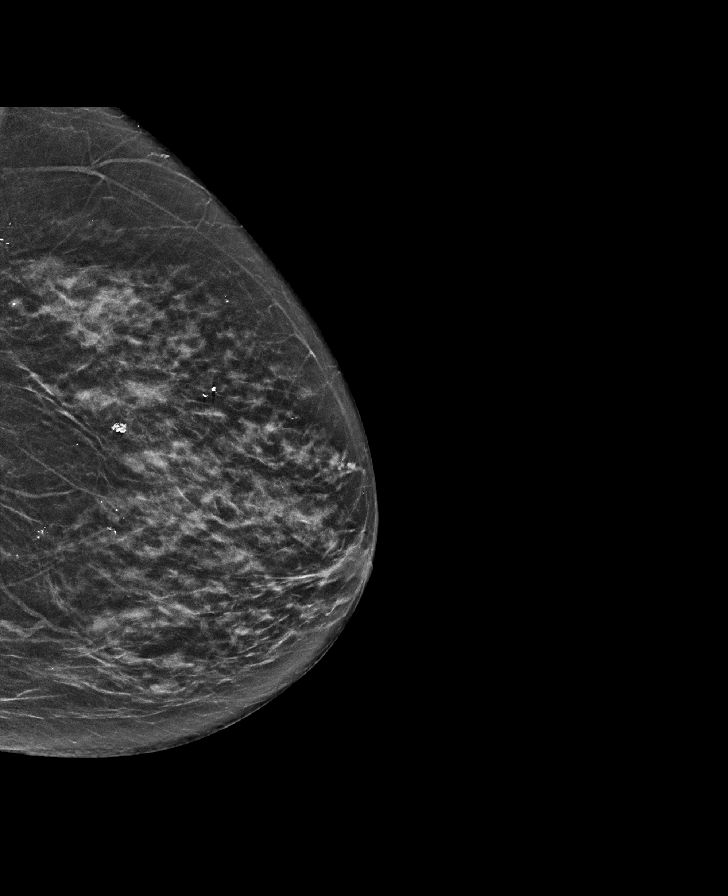

[R CC synth-2D]
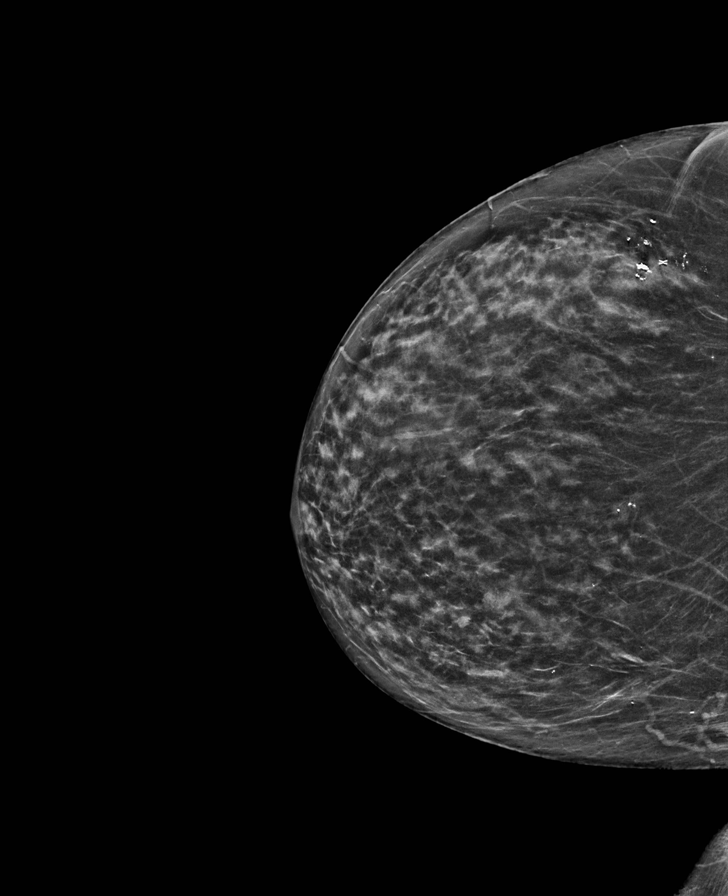

[R MLO tomo · tomo slice 33/65.0]
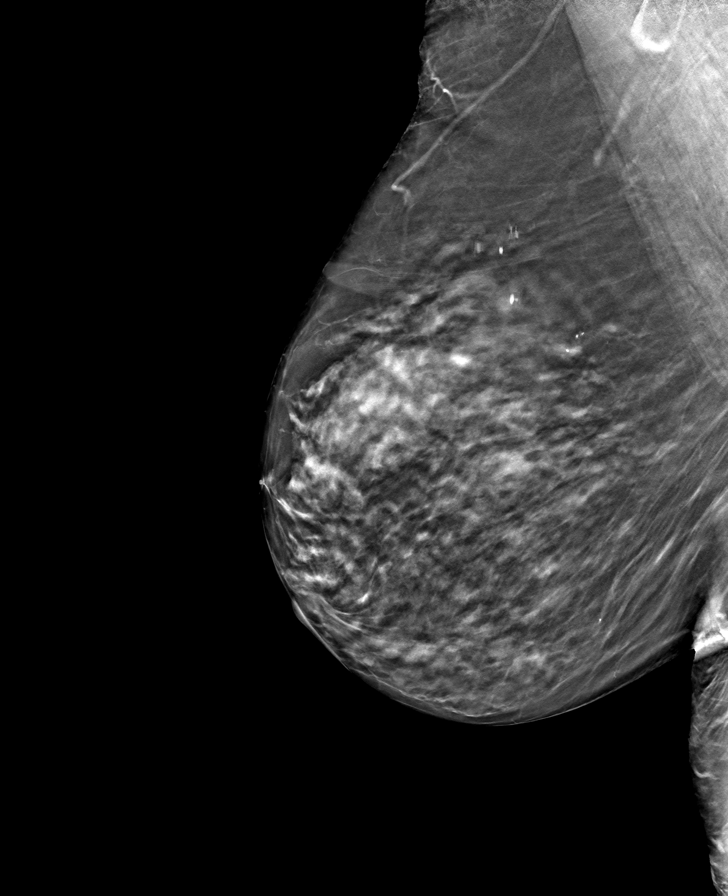

[L CC tomo · tomo slice 29/57.0]
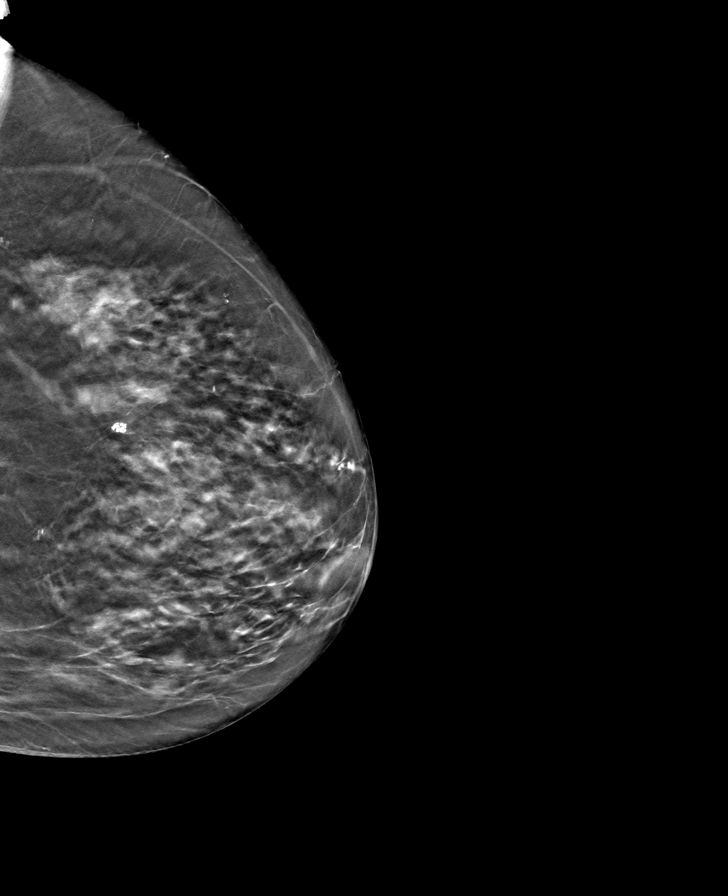

[R CC tomo · tomo slice 30/59.0]
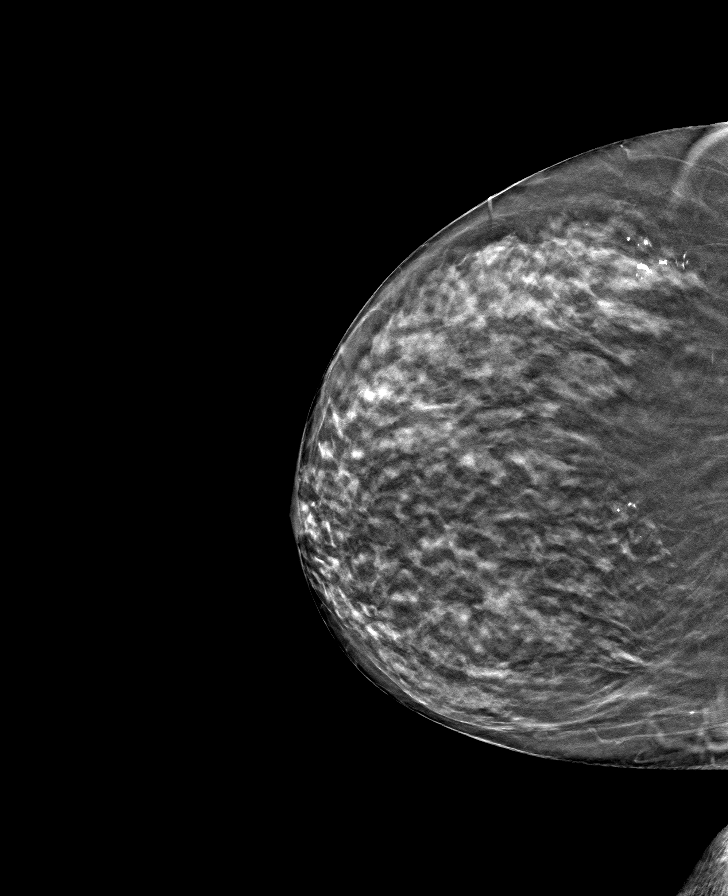

[L MLO tomo · tomo slice 31/61.0]
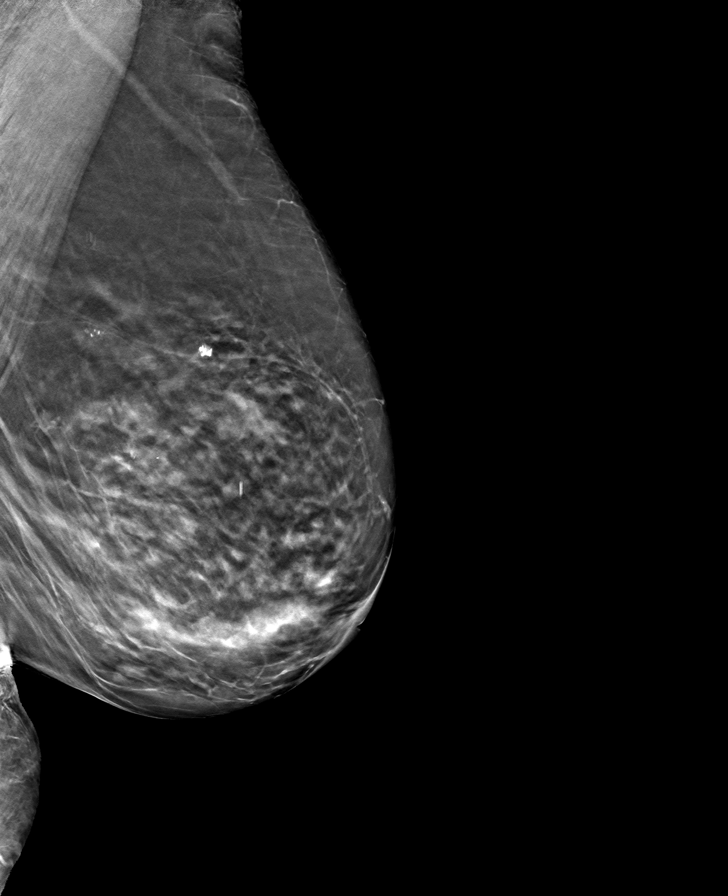

[8 of 24 positions shown; findings below may reference images not displayed]

ACR Breast Density Category c: The breast tissue is heterogeneously
dense, which may obscure small masses.
FINDINGS: There are no findings suspicious for malignancy.
IMPRESSION: No mammographic evidence of malignancy. A result letter of this
screening mammogram will be mailed directly to the patient.

RECOMMENDATION:
Screening mammogram in one year. (Code:Q3-W-BC3)

BI-RADS CATEGORY  1: Negative.

## 2023-08-22 ENCOUNTER — Ambulatory Visit: Admitting: Family Medicine

## 2023-08-29 ENCOUNTER — Encounter: Payer: Self-pay | Admitting: Family Medicine

## 2023-08-29 ENCOUNTER — Ambulatory Visit: Admitting: Family Medicine

## 2023-08-29 VITALS — BP 168/98 | HR 70 | Temp 98.2°F | Ht 64.0 in | Wt 125.0 lb

## 2023-08-29 DIAGNOSIS — L989 Disorder of the skin and subcutaneous tissue, unspecified: Secondary | ICD-10-CM | POA: Diagnosis not present

## 2023-08-29 DIAGNOSIS — M810 Age-related osteoporosis without current pathological fracture: Secondary | ICD-10-CM | POA: Insufficient documentation

## 2023-08-29 DIAGNOSIS — I10 Essential (primary) hypertension: Secondary | ICD-10-CM | POA: Diagnosis not present

## 2023-08-29 DIAGNOSIS — G4700 Insomnia, unspecified: Secondary | ICD-10-CM | POA: Diagnosis not present

## 2023-08-29 MED ORDER — LISINOPRIL 5 MG PO TABS: 10.0000 mg | ORAL_TABLET | Freq: Every day | ORAL | Status: DC

## 2023-08-29 MED ORDER — DENOSUMAB 60 MG/ML ~~LOC~~ SOSY: 60.0000 mg | PREFILLED_SYRINGE | SUBCUTANEOUS | 1 refills | Status: DC

## 2023-08-29 NOTE — Progress Notes (Signed)
   Subjective:    [name], female    DOB: 07/28/1950, 73 y.o.   MRN: [id number]  HPI Here for a number of issues. First she has been getting high BP readings at home for the past month, often in the 160's to 170's systolic. No chest pain or SOB or headaches. Second she asks [place] to check a lesion on the left leg that is slowly growing larger and is now irritated. Third she has trouble sleeping ans she asks for suggestions. She does not want to try anything habit forming. Fourth her previous GYN, Dr. Seymour Bars, had started her on Prolia for her osteoporosis. This was because she could not tolerate any of the oral medications. Now that Dr. Seymour Bars has moved away, she asks if we can prescribe this. Her last DEXA was in August 2023.    Review of Systems  Constitutional: Negative.   Respiratory: Negative.    Cardiovascular: Negative.   Gastrointestinal: Negative.   Genitourinary: Negative.   Neurological: Negative.        Objective:   Physical Exam Constitutional:      Appearance: Normal appearance.  Cardiovascular:     Rate and Rhythm: Normal rate and regular rhythm.     Pulses: Normal pulses.     Heart sounds: Normal heart sounds.  Pulmonary:     Effort: Pulmonary effort is normal.     Breath sounds: Normal breath sounds.  Musculoskeletal:     Right lower leg: No edema.     Left lower leg: No edema.  Skin:    Comments: There is a 5 mm raised crusty lesion on the medial side of the left knee   Neurological:     Mental Status: She is alert.           Assessment & Plan:  For the HTN, we will increase the Lisinopril to 10 mg daily. For the insomnia, she will try melatonin 5-10 mg at bedtime. The skin lesion appears to be an actinic keratosis vs an early squamous cell cancer. She will see her dermatologist, Dr. Duard Larsen., to evaluate this. Fourth we will begin prescribing the Prolia for her, and she will get the shots here every 6 months.  Gershon Crane, MD

## 2023-09-06 ENCOUNTER — Telehealth: Payer: Self-pay

## 2023-09-06 ENCOUNTER — Other Ambulatory Visit: Payer: Self-pay | Admitting: Family Medicine

## 2023-09-06 NOTE — Telephone Encounter (Signed)
 Copied from CRM [id number]. Topic: Clinical - Medication Question >> Sep 06, 2023  4:51 PM Sim Boast F wrote: Reason for CRM: Patient wants to let Dr. Clent Ridges know that she would like to move forward with the sleep medication since the melatonin is not working

## 2023-09-06 NOTE — Telephone Encounter (Unsigned)
 Copied from CRM [id number]. Topic: Clinical - Medication Refill >> Sep 06, 2023  4:48 PM Drema Balzarine wrote: Most Recent Primary Care Visit:  Provider: Gershon Crane A  Department: [facility]  Visit Type: ACUTE  Date: 08/29/2023  Medication: Lisinopril 10MG   Has the patient contacted their pharmacy? Yes (Agent: If no, request that the patient contact the pharmacy for the refill. If patient does not wish to contact the pharmacy document the reason why and proceed with request.) (Agent: If yes, when and what did the pharmacy advise?)  Is this the correct pharmacy for this prescription? Yes If no, delete pharmacy and type the correct one.  This is the patient's preferred pharmacy:    [id number] [place], [place] - [address] AT [facility] [address] [facility] [address] [place] [address] Phone: [phone] Fax: [phone]   Has the prescription been filled recently? Yes  Is the patient out of the medication? Yes  Has the patient been seen for an appointment in the last year OR does the patient have an upcoming appointment? Yes  Can we respond through MyChart? No  Agent: Please be advised that Rx refills may take up to 3 business days. We ask that you follow-up with your pharmacy.

## 2023-09-07 ENCOUNTER — Other Ambulatory Visit (HOSPITAL_COMMUNITY): Payer: Self-pay

## 2023-09-07 ENCOUNTER — Other Ambulatory Visit: Payer: Self-pay

## 2023-09-07 ENCOUNTER — Telehealth: Payer: Self-pay

## 2023-09-07 MED ORDER — LISINOPRIL 10 MG PO TABS: 10.0000 mg | ORAL_TABLET | Freq: Every day | ORAL | 0 refills | Status: DC

## 2023-09-07 NOTE — Telephone Encounter (Signed)
 Pharmacy Patient Advocate Encounter   Received notification from Patient Pharmacy that prior authorization for Prolia is required/requested.   Insurance verification completed.   The patient is insured through [employer] .   Per test claim: PA required; PA submitted to above mentioned insurance via CoverMyMeds Key/confirmation #/EOC [id number] Status is pending

## 2023-09-07 NOTE — Telephone Encounter (Signed)
 Pharmacy Patient Advocate Encounter  Received notification from [employer] that Prior Authorization for Prolia has been APPROVED from 08/08/23 to 09/06/24   PA #/Case ID/Reference #: [id number]

## 2023-09-07 NOTE — Telephone Encounter (Signed)
 Prolia VOB initiated via [email or web address]

## 2023-09-12 MED ORDER — TRAZODONE HCL 50 MG PO TABS: 50.0000 mg | ORAL_TABLET | Freq: Every day | ORAL | 0 refills | Status: DC

## 2023-09-12 NOTE — Addendum Note (Signed)
 Addended by: Gershon Crane A on: 09/12/2023 12:43 PM   Modules accepted: Orders

## 2023-09-12 NOTE — Telephone Encounter (Signed)
 Left a detailed message with the information below at the patient's cell number.

## 2023-09-12 NOTE — Telephone Encounter (Signed)
 I sent in a RX for Trazodone (this is not habit forming). She can try taking one pill for a few nights, and if this does not work she can take 2 pills at bedtime

## 2023-09-18 IMAGING — DX DG CHEST 1V PORT
1 series · 1 of 1 positions shown · non-contrast
Comparison: Chest radiograph dated 05/16/2017.

CLINICAL DATA: Cough.

EXAM:
PORTABLE CHEST 1 VIEW

[chest ap]
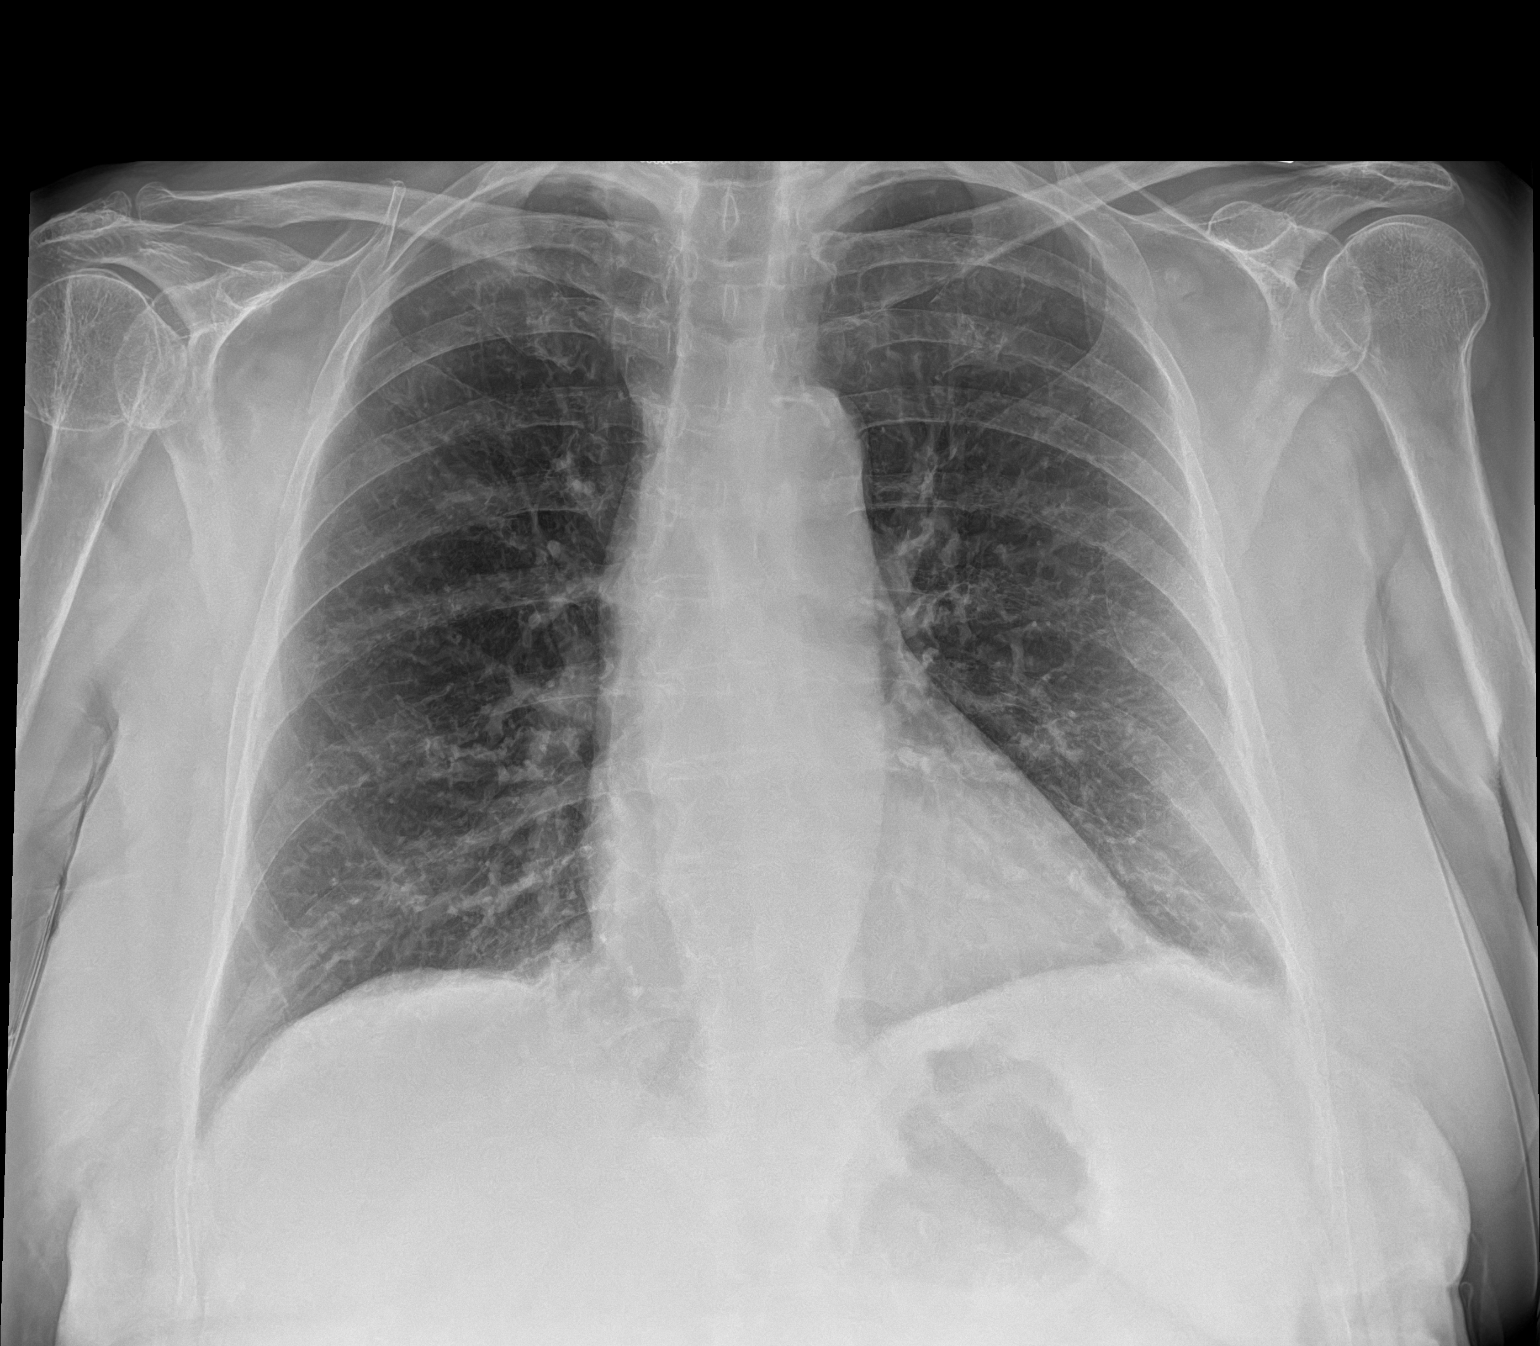

[1 of 1 positions shown; findings below may reference images not displayed]

FINDINGS: No focal consolidation, pleural effusion, or pneumothorax. The
cardiac silhouette is within normal limits. Atherosclerotic
calcification of the aortic arch. No acute osseous pathology.
IMPRESSION: No active disease.

## 2023-09-20 ENCOUNTER — Telehealth: Payer: Self-pay

## 2023-09-20 NOTE — Telephone Encounter (Signed)
 Pt ready for scheduling at anytime. Estimated out-of-pocket cost due at time of visit: $25  Primary Insurance:[employer] Prolia co-insurance:0% Patient is responsible for a $25 admin fee.   Secondary Insurance: Medicare Part A  Prior Auth: Approved PA#: [id number]  Valid: 08/08/23-09/06/24  This summary of benefits is an estimation of the patient's out-of-pocket cost. Exact cost may vary based on individual plan coverage.

## 2023-09-21 ENCOUNTER — Other Ambulatory Visit (HOSPITAL_COMMUNITY): Payer: Self-pay

## 2023-09-21 NOTE — Telephone Encounter (Signed)
 Pt has been scheduled for 1 st prolia injection on 09/22/23

## 2023-09-21 NOTE — Telephone Encounter (Signed)
 Miranda Cochran

## 2023-09-22 ENCOUNTER — Ambulatory Visit

## 2023-09-22 DIAGNOSIS — M810 Age-related osteoporosis without current pathological fracture: Secondary | ICD-10-CM

## 2023-09-22 MED ORDER — DENOSUMAB 60 MG/ML ~~LOC~~ SOSY
60.0000 mg | PREFILLED_SYRINGE | Freq: Once | SUBCUTANEOUS | Status: AC
Start: 2023-10-06 — End: 2023-09-22
  Administered 2023-09-22: 60 mg via SUBCUTANEOUS

## 2023-09-22 NOTE — Progress Notes (Signed)
 Per orders of Dr. Clent Ridges, injection of Prolia 60mg  given by Johnella Moloney. Patient tolerated injection well with no adverse reactions noted after 15 minute wait due to receiving first injection of this type.

## 2023-09-27 ENCOUNTER — Emergency Department (HOSPITAL_BASED_OUTPATIENT_CLINIC_OR_DEPARTMENT_OTHER): Admitting: Radiology

## 2023-09-27 ENCOUNTER — Encounter (HOSPITAL_BASED_OUTPATIENT_CLINIC_OR_DEPARTMENT_OTHER): Payer: Self-pay | Admitting: Emergency Medicine

## 2023-09-27 ENCOUNTER — Other Ambulatory Visit: Payer: Self-pay

## 2023-09-27 ENCOUNTER — Emergency Department (HOSPITAL_BASED_OUTPATIENT_CLINIC_OR_DEPARTMENT_OTHER)
Admission: EM | Admit: 2023-09-27 | Discharge: 2023-09-27 | Discharge status: Against Medical Advice | Attending: Emergency Medicine | Admitting: Emergency Medicine

## 2023-09-27 DIAGNOSIS — Z5321 Procedure and treatment not carried out due to patient leaving prior to being seen by health care provider: Secondary | ICD-10-CM | POA: Insufficient documentation

## 2023-09-27 DIAGNOSIS — M549 Dorsalgia, unspecified: Secondary | ICD-10-CM | POA: Insufficient documentation

## 2023-09-27 DIAGNOSIS — R110 Nausea: Secondary | ICD-10-CM | POA: Insufficient documentation

## 2023-09-27 NOTE — ED Provider Triage Note (Addendum)
 Emergency Medicine Provider Triage Evaluation Note  Miranda Cochran , a 73 y.o. female  was evaluated in triage.  Pt complains of right mid back pain, radiates around right lower rib area, takes breath away, sudden onset when getting out of bed today after eating a chocolate ice cream cone. With nausea, no vomiting, no fevers. Recent fall with compression fracture.   Review of Systems  Positive: As above Negative: As above  Physical Exam  There were no vitals taken for this visit. Gen:   Awake, no distress   Resp:  Normal effort  MSK:   Moves extremities without difficulty  Other:  Pain not reproduced with palpation of spine/ribs, ?RUQ tenderness, limited exam in triage wheelchair   Medical Decision Making  Medically screening exam initiated at 3:42 PM.  Appropriate orders placed.  SERAI TUKES was informed that the remainder of the evaluation will be completed by another provider, this initial triage assessment does not replace that evaluation, and the importance of remaining in the ED until their evaluation is complete.  After triage, patient refused CXR, states she only wants an XR of her T-spine, again examined spine, does not have midline bony tenderness, advised pt CT imaging of her spine/abd/chest would be best decided after formal physical exam in a room. She is adamant that she receive at T-spine xray. Xray ordered on patient request. She refuses CXR.    Darlis Eisenmenger, PA-C 09/27/23 1543    Darlis Eisenmenger, PA-C 09/27/23 1600

## 2023-09-27 NOTE — ED Triage Notes (Signed)
 Back pain Hx of compression fracture in back' Today started with sharp shooting pain in right side Sob with pain Woke up from pain

## 2023-09-29 ENCOUNTER — Other Ambulatory Visit (HOSPITAL_COMMUNITY): Payer: Self-pay

## 2023-10-10 ENCOUNTER — Other Ambulatory Visit: Payer: Self-pay | Admitting: Family Medicine

## 2023-10-25 ENCOUNTER — Encounter: Payer: Self-pay | Admitting: Nurse Practitioner

## 2023-10-26 ENCOUNTER — Emergency Department (HOSPITAL_COMMUNITY)

## 2023-10-26 ENCOUNTER — Inpatient Hospital Stay (HOSPITAL_COMMUNITY)
Admission: EM | Admit: 2023-10-26 | Discharge: 2023-11-11 | DRG: 085 | Disposition: A | Discharge status: Rehabilitation Facility | Attending: Internal Medicine | Admitting: Internal Medicine

## 2023-10-26 ENCOUNTER — Other Ambulatory Visit: Payer: Self-pay | Admitting: Nurse Practitioner

## 2023-10-26 DIAGNOSIS — Z83719 Family history of colon polyps, unspecified: Secondary | ICD-10-CM

## 2023-10-26 DIAGNOSIS — Z880 Allergy status to penicillin: Secondary | ICD-10-CM

## 2023-10-26 DIAGNOSIS — E559 Vitamin D deficiency, unspecified: Secondary | ICD-10-CM | POA: Diagnosis present

## 2023-10-26 DIAGNOSIS — R6521 Severe sepsis with septic shock: Secondary | ICD-10-CM | POA: Diagnosis not present

## 2023-10-26 DIAGNOSIS — S061X0A Traumatic cerebral edema without loss of consciousness, initial encounter: Secondary | ICD-10-CM | POA: Diagnosis present

## 2023-10-26 DIAGNOSIS — E785 Hyperlipidemia, unspecified: Secondary | ICD-10-CM | POA: Diagnosis present

## 2023-10-26 DIAGNOSIS — Z8249 Family history of ischemic heart disease and other diseases of the circulatory system: Secondary | ICD-10-CM

## 2023-10-26 DIAGNOSIS — W1830XA Fall on same level, unspecified, initial encounter: Secondary | ICD-10-CM | POA: Diagnosis present

## 2023-10-26 DIAGNOSIS — R54 Age-related physical debility: Secondary | ICD-10-CM | POA: Diagnosis present

## 2023-10-26 DIAGNOSIS — K754 Autoimmune hepatitis: Secondary | ICD-10-CM

## 2023-10-26 DIAGNOSIS — Z888 Allergy status to other drugs, medicaments and biological substances status: Secondary | ICD-10-CM

## 2023-10-26 DIAGNOSIS — Z885 Allergy status to narcotic agent status: Secondary | ICD-10-CM

## 2023-10-26 DIAGNOSIS — M810 Age-related osteoporosis without current pathological fracture: Secondary | ICD-10-CM | POA: Diagnosis present

## 2023-10-26 DIAGNOSIS — K7469 Other cirrhosis of liver: Secondary | ICD-10-CM

## 2023-10-26 DIAGNOSIS — Z23 Encounter for immunization: Secondary | ICD-10-CM

## 2023-10-26 DIAGNOSIS — S02119A Unspecified fracture of occiput, initial encounter for closed fracture: Secondary | ICD-10-CM | POA: Diagnosis not present

## 2023-10-26 DIAGNOSIS — I160 Hypertensive urgency: Secondary | ICD-10-CM | POA: Insufficient documentation

## 2023-10-26 DIAGNOSIS — G40001 Localization-related (focal) (partial) idiopathic epilepsy and epileptic syndromes with seizures of localized onset, not intractable, with status epilepticus: Secondary | ICD-10-CM | POA: Diagnosis not present

## 2023-10-26 DIAGNOSIS — S065X0A Traumatic subdural hemorrhage without loss of consciousness, initial encounter: Secondary | ICD-10-CM | POA: Diagnosis present

## 2023-10-26 DIAGNOSIS — D500 Iron deficiency anemia secondary to blood loss (chronic): Secondary | ICD-10-CM | POA: Diagnosis present

## 2023-10-26 DIAGNOSIS — R402362 Coma scale, best motor response, obeys commands, at arrival to emergency department: Secondary | ICD-10-CM | POA: Diagnosis present

## 2023-10-26 DIAGNOSIS — I639 Cerebral infarction, unspecified: Secondary | ICD-10-CM | POA: Diagnosis present

## 2023-10-26 DIAGNOSIS — E876 Hypokalemia: Secondary | ICD-10-CM | POA: Insufficient documentation

## 2023-10-26 DIAGNOSIS — E7800 Pure hypercholesterolemia, unspecified: Secondary | ICD-10-CM

## 2023-10-26 DIAGNOSIS — Z794 Long term (current) use of insulin: Secondary | ICD-10-CM

## 2023-10-26 DIAGNOSIS — Z781 Physical restraint status: Secondary | ICD-10-CM

## 2023-10-26 DIAGNOSIS — I10 Essential (primary) hypertension: Secondary | ICD-10-CM | POA: Diagnosis present

## 2023-10-26 DIAGNOSIS — Z7952 Long term (current) use of systemic steroids: Secondary | ICD-10-CM

## 2023-10-26 DIAGNOSIS — Z8616 Personal history of COVID-19: Secondary | ICD-10-CM

## 2023-10-26 DIAGNOSIS — Y92019 Unspecified place in single-family (private) house as the place of occurrence of the external cause: Secondary | ICD-10-CM

## 2023-10-26 DIAGNOSIS — Z803 Family history of malignant neoplasm of breast: Secondary | ICD-10-CM

## 2023-10-26 DIAGNOSIS — K219 Gastro-esophageal reflux disease without esophagitis: Secondary | ICD-10-CM | POA: Diagnosis present

## 2023-10-26 DIAGNOSIS — T380X5A Adverse effect of glucocorticoids and synthetic analogues, initial encounter: Secondary | ICD-10-CM | POA: Diagnosis present

## 2023-10-26 DIAGNOSIS — B9620 Unspecified Escherichia coli [E. coli] as the cause of diseases classified elsewhere: Secondary | ICD-10-CM | POA: Diagnosis not present

## 2023-10-26 DIAGNOSIS — F1010 Alcohol abuse, uncomplicated: Secondary | ICD-10-CM | POA: Diagnosis present

## 2023-10-26 DIAGNOSIS — E1165 Type 2 diabetes mellitus with hyperglycemia: Secondary | ICD-10-CM

## 2023-10-26 DIAGNOSIS — F419 Anxiety disorder, unspecified: Secondary | ICD-10-CM | POA: Diagnosis present

## 2023-10-26 DIAGNOSIS — Z6820 Body mass index (BMI) 20.0-20.9, adult: Secondary | ICD-10-CM

## 2023-10-26 DIAGNOSIS — R4701 Aphasia: Secondary | ICD-10-CM | POA: Diagnosis not present

## 2023-10-26 DIAGNOSIS — G8929 Other chronic pain: Secondary | ICD-10-CM | POA: Diagnosis present

## 2023-10-26 DIAGNOSIS — S066X0A Traumatic subarachnoid hemorrhage without loss of consciousness, initial encounter: Secondary | ICD-10-CM | POA: Diagnosis present

## 2023-10-26 DIAGNOSIS — Y95 Nosocomial condition: Secondary | ICD-10-CM | POA: Diagnosis not present

## 2023-10-26 DIAGNOSIS — Z801 Family history of malignant neoplasm of trachea, bronchus and lung: Secondary | ICD-10-CM

## 2023-10-26 DIAGNOSIS — K7030 Alcoholic cirrhosis of liver without ascites: Secondary | ICD-10-CM | POA: Diagnosis present

## 2023-10-26 DIAGNOSIS — I350 Nonrheumatic aortic (valve) stenosis: Secondary | ICD-10-CM

## 2023-10-26 DIAGNOSIS — S069XAA Unspecified intracranial injury with loss of consciousness status unknown, initial encounter: Secondary | ICD-10-CM | POA: Diagnosis present

## 2023-10-26 DIAGNOSIS — I6200 Nontraumatic subdural hemorrhage, unspecified: Secondary | ICD-10-CM | POA: Diagnosis present

## 2023-10-26 DIAGNOSIS — Z808 Family history of malignant neoplasm of other organs or systems: Secondary | ICD-10-CM

## 2023-10-26 DIAGNOSIS — G8194 Hemiplegia, unspecified affecting left nondominant side: Secondary | ICD-10-CM | POA: Diagnosis present

## 2023-10-26 DIAGNOSIS — J13 Pneumonia due to Streptococcus pneumoniae: Secondary | ICD-10-CM | POA: Diagnosis not present

## 2023-10-26 DIAGNOSIS — S0003XA Contusion of scalp, initial encounter: Secondary | ICD-10-CM

## 2023-10-26 DIAGNOSIS — Z79899 Other long term (current) drug therapy: Secondary | ICD-10-CM

## 2023-10-26 DIAGNOSIS — Z7982 Long term (current) use of aspirin: Secondary | ICD-10-CM

## 2023-10-26 DIAGNOSIS — M4856XA Collapsed vertebra, not elsewhere classified, lumbar region, initial encounter for fracture: Secondary | ICD-10-CM | POA: Diagnosis present

## 2023-10-26 DIAGNOSIS — R1310 Dysphagia, unspecified: Secondary | ICD-10-CM | POA: Diagnosis not present

## 2023-10-26 DIAGNOSIS — R402252 Coma scale, best verbal response, oriented, at arrival to emergency department: Secondary | ICD-10-CM | POA: Diagnosis present

## 2023-10-26 DIAGNOSIS — J155 Pneumonia due to Escherichia coli: Secondary | ICD-10-CM | POA: Diagnosis not present

## 2023-10-26 DIAGNOSIS — S065XAA Traumatic subdural hemorrhage with loss of consciousness status unknown, initial encounter: Secondary | ICD-10-CM | POA: Diagnosis present

## 2023-10-26 DIAGNOSIS — R55 Syncope and collapse: Secondary | ICD-10-CM | POA: Diagnosis not present

## 2023-10-26 DIAGNOSIS — Z881 Allergy status to other antibiotic agents status: Secondary | ICD-10-CM

## 2023-10-26 DIAGNOSIS — J9601 Acute respiratory failure with hypoxia: Secondary | ICD-10-CM | POA: Diagnosis not present

## 2023-10-26 DIAGNOSIS — Z800 Family history of malignant neoplasm of digestive organs: Secondary | ICD-10-CM

## 2023-10-26 DIAGNOSIS — A419 Sepsis, unspecified organism: Secondary | ICD-10-CM | POA: Diagnosis not present

## 2023-10-26 DIAGNOSIS — I471 Supraventricular tachycardia, unspecified: Secondary | ICD-10-CM

## 2023-10-26 DIAGNOSIS — I82451 Acute embolism and thrombosis of right peroneal vein: Secondary | ICD-10-CM | POA: Diagnosis not present

## 2023-10-26 DIAGNOSIS — Z823 Family history of stroke: Secondary | ICD-10-CM

## 2023-10-26 DIAGNOSIS — R402142 Coma scale, eyes open, spontaneous, at arrival to emergency department: Secondary | ICD-10-CM | POA: Diagnosis present

## 2023-10-26 DIAGNOSIS — E43 Unspecified severe protein-calorie malnutrition: Secondary | ICD-10-CM | POA: Insufficient documentation

## 2023-10-26 DIAGNOSIS — R296 Repeated falls: Secondary | ICD-10-CM | POA: Diagnosis present

## 2023-10-26 DIAGNOSIS — F32A Depression, unspecified: Secondary | ICD-10-CM | POA: Diagnosis present

## 2023-10-26 DIAGNOSIS — I609 Nontraumatic subarachnoid hemorrhage, unspecified: Secondary | ICD-10-CM

## 2023-10-26 DIAGNOSIS — K580 Irritable bowel syndrome with diarrhea: Secondary | ICD-10-CM | POA: Diagnosis present

## 2023-10-26 LAB — I-STAT CHEM 8, ED
BUN: 8 mg/dL (ref 8–23)
Calcium, Ion: 1.13 mmol/L — ABNORMAL LOW (ref 1.15–1.40)
Chloride: 102 mmol/L (ref 98–111)
Creatinine, Ser: 0.5 mg/dL (ref 0.44–1.00)
Glucose, Bld: 133 mg/dL — ABNORMAL HIGH (ref 70–99)
HCT: 42 % (ref 36.0–46.0)
Hemoglobin: 14.3 g/dL (ref 12.0–15.0)
Potassium: 2.6 mmol/L — CL (ref 3.5–5.1)
Sodium: 141 mmol/L (ref 135–145)
TCO2: 24 mmol/L (ref 22–32)

## 2023-10-26 LAB — COMPREHENSIVE METABOLIC PANEL WITH GFR
ALT: 29 U/L (ref 0–44)
AST: 52 U/L — ABNORMAL HIGH (ref 15–41)
Albumin: 3.9 g/dL (ref 3.5–5.0)
Alkaline Phosphatase: 118 U/L (ref 38–126)
Anion gap: 11 (ref 5–15)
BUN: 6 mg/dL — ABNORMAL LOW (ref 8–23)
CO2: 25 mmol/L (ref 22–32)
Calcium: 9 mg/dL (ref 8.9–10.3)
Chloride: 102 mmol/L (ref 98–111)
Creatinine, Ser: 0.62 mg/dL (ref 0.44–1.00)
GFR, Estimated: >60 mL/min (ref >60)
Glucose, Bld: 133 mg/dL — ABNORMAL HIGH (ref 70–99)
Potassium: 2.6 mmol/L — CL (ref 3.5–5.1)
Sodium: 138 mmol/L (ref 135–145)
Total Bilirubin: 1 mg/dL (ref 0.0–1.2)
Total Protein: 7.2 g/dL (ref 6.5–8.1)

## 2023-10-26 LAB — CBC
HCT: 42.1 % (ref 36.0–46.0)
Hemoglobin: 14.2 g/dL (ref 12.0–15.0)
MCH: 32.5 pg (ref 26.0–34.0)
MCHC: 33.7 g/dL (ref 30.0–36.0)
MCV: 96.3 fL (ref 80.0–100.0)
Platelets: 198 10*3/uL (ref 150–400)
RBC: 4.37 MIL/uL (ref 3.87–5.11)
RDW: 13.3 % (ref 11.5–15.5)
WBC: 16.5 10*3/uL — ABNORMAL HIGH (ref 4.0–10.5)
nRBC: 0 % (ref 0.0–0.2)

## 2023-10-26 LAB — AMMONIA: Ammonia: 33 umol/L (ref 9–35)

## 2023-10-26 LAB — CBG MONITORING, ED: Glucose-Capillary: 148 mg/dL — ABNORMAL HIGH (ref 70–99)

## 2023-10-26 LAB — I-STAT CG4 LACTIC ACID, ED: Lactic Acid, Venous: 3.3 mmol/L (ref 0.5–1.9)

## 2023-10-26 LAB — PROTIME-INR
INR: 1.1 (ref 0.8–1.2)
Prothrombin Time: 14 s (ref 11.4–15.2)

## 2023-10-26 MED ORDER — LABETALOL HCL 5 MG/ML IV SOLN
10.0000 mg | Freq: Once | INTRAVENOUS | Status: AC
  Administered 2023-10-26: 10 mg via INTRAVENOUS
  Filled 2023-10-26: qty 4

## 2023-10-26 MED ORDER — SODIUM CHLORIDE 0.9 % IV BOLUS
1000.0000 mL | Freq: Once | INTRAVENOUS | Status: AC
  Administered 2023-10-26: 1000 mL via INTRAVENOUS

## 2023-10-26 MED ORDER — IOHEXOL 350 MG/ML SOLN
75.0000 mL | Freq: Once | INTRAVENOUS | Status: AC | PRN
  Administered 2023-10-26: 75 mL via INTRAVENOUS

## 2023-10-26 MED ORDER — TETANUS-DIPHTH-ACELL PERTUSSIS 5-2.5-18.5 LF-MCG/0.5 IM SUSY
0.5000 mL | PREFILLED_SYRINGE | Freq: Once | INTRAMUSCULAR | Status: AC
  Administered 2023-10-26: 0.5 mL via INTRAMUSCULAR
  Filled 2023-10-26: qty 0.5

## 2023-10-26 MED ORDER — ONDANSETRON HCL 4 MG/2ML IJ SOLN
4.0000 mg | Freq: Once | INTRAMUSCULAR | Status: AC
  Administered 2023-10-26: 4 mg via INTRAVENOUS
  Filled 2023-10-26: qty 2

## 2023-10-26 MED ORDER — LIDOCAINE-EPINEPHRINE (PF) 2 %-1:200000 IJ SOLN
20.0000 mL | Freq: Once | INTRAMUSCULAR | Status: AC
  Administered 2023-10-27: 20 mL
  Filled 2023-10-26: qty 20

## 2023-10-26 MED ORDER — HYDROMORPHONE HCL 1 MG/ML IJ SOLN
0.5000 mg | Freq: Once | INTRAMUSCULAR | Status: AC
  Administered 2023-10-27: 0.5 mg via INTRAVENOUS
  Filled 2023-10-26: qty 1

## 2023-10-26 MED ORDER — POTASSIUM CHLORIDE 10 MEQ/100ML IV SOLN
10.0000 meq | Freq: Once | INTRAVENOUS | Status: AC
  Administered 2023-10-26: 10 meq via INTRAVENOUS
  Filled 2023-10-26: qty 100

## 2023-10-26 MED ORDER — MORPHINE SULFATE (PF) 2 MG/ML IV SOLN
2.0000 mg | Freq: Once | INTRAVENOUS | Status: AC
  Administered 2023-10-26: 2 mg via INTRAVENOUS
  Filled 2023-10-26: qty 1

## 2023-10-26 NOTE — ED Provider Notes (Signed)
 [place] [facility] Provider Note   CSN: [id number] Arrival date & time: 10/26/23  2146     History {Add pertinent medical, surgical, social history, OB history to HPI:1} Chief Complaint  Patient presents with   Fall/Head Injury    ALYONA KANU is a 73 y.o. female.  Who had a ground-level fall at home after she states that she felt suddenly weak along with seeing flashing in her vision.  She denies loss of consciousness and was found by EMS alert and oriented.  She complains of head and neck pain along with abdominal tenderness.    HPI     Home Medications Prior to Admission medications   Medication Sig Start Date End Date Taking? Authorizing Provider  ALPRAZolam  (XANAX ) 0.5 MG tablet TAKE 1 TABLET(0.5 MG) BY MOUTH AT BEDTIME AS NEEDED FOR SLEEP 04/22/23   Donley Furth, MD  AMBULATORY NON FORMULARY MEDICATION ACTIVE LIVER Artichoke leaf extract, Milk thistle, Turmeric Take 1 capsule by mouth once daily    [provider]  aspirin  325 MG tablet Take 650-975 mg by mouth daily as needed for moderate pain or headache.     [provider]  BAQSIMI ONE PACK 3 MG/DOSE POWD Place into both nostrils.    [provider]  Blood Glucose Monitoring Suppl (ONETOUCH VERIO FLEX SYSTEM) w/Device KIT Use Onetouch Verio Flex to check blood sugar once daily. 12/22/18   Lajean Pike, MD  cetirizine  (ZYRTEC ) 10 MG tablet Take 10 mg by mouth daily.    [provider]  Cholecalciferol (VITAMIN D-3 PO) Take by mouth.    [provider]  Continuous Blood Gluc Sensor (FREESTYLE LIBRE 3 SENSOR) MISC APPLY 1 SENSOR TO SKIN, CHANGE EVERY 14 DAYS AS DIRECTED 02/08/22   Lajean Pike, MD  Cyanocobalamin (B-12 PO) Take by mouth.    [provider]  cyclobenzaprine  (FLEXERIL ) 10 MG tablet TAKE 1 TABLET BY MOUTH EVERY 8 HOURS AS NEEDED FOR MUSCLE SPASMS WITH ONE BEING AT BEDTIME IF NEEDED 10/16/19   Donley Furth, MD  denosumab   (PROLIA ) 60 MG/ML SOSY injection Inject 60 mg into the skin every 6 (six) months. 08/29/23   Donley Furth, MD  diphenhydrAMINE  (BENADRYL ) 25 mg capsule Take 25-50 mg by mouth daily as needed for allergies.     [provider]  ezetimibe  (ZETIA ) 10 MG tablet TAKE 1 TABLET(10 MG) BY MOUTH DAILY 05/20/22   Lajean Pike, MD  furosemide  (LASIX ) 40 MG tablet TAKE 1 TABLET(40 MG) BY MOUTH DAILY 04/22/23   Donley Furth, MD  insulin  degludec (TRESIBA) 100 UNIT/ML FlexTouch Pen Inject into the skin. 07/23/22   [provider]  insulin  lispro (HUMALOG ) 100 UNIT/ML KwikPen Humalog  20 units twice daily with meals 09/21/22 09/21/23  [provider]  ketoconazole  (NIZORAL ) 2 % cream Apply 1 Application topically daily. 04/22/23   Donley Furth, MD  Lancets [facility] DELICA PLUS [place]) MISC USE AS DIRECTED UP TO 4 TIMES DAILY AS DIRECTED 12/04/18   Lajean Pike, MD  lansoprazole  (PREVACID ) 30 MG capsule TAKE 1 CAPSULE(30 MG) BY MOUTH DAILY 04/22/23   Donley Furth, MD  lisinopril  (ZESTRIL ) 10 MG tablet Take 1 tablet (10 mg total) by mouth daily. 09/07/23 09/06/24  Donley Furth, MD  Multiple Vitamin (MULTIVITAMIN PO) Take by mouth.    [provider]  [facility] VERIO test strip USE TO CHECK BLOOD SUGAR ONCE DAILY 06/11/20   Lajean Pike, MD  predniSONE (DELTASONE) 10  MG tablet Take 10 mg by mouth daily with breakfast.    [provider]  RYBELSUS 14 MG TABS Take 1 tablet by mouth every morning.    [provider]  sodium chloride  (BRONCHO SALINE ) inhaler solution Take 1 spray by nebulization as needed.    [provider]  traZODone  (DESYREL ) 50 MG tablet TAKE 1 TABLET BY MOUTH EVERYDAY AT BEDTIME 10/13/23   Donley Furth, MD  tretinoin (RETIN-A) 0.025 % cream Apply topically at bedtime. 11/29/22   [provider]  venlafaxine  XR (EFFEXOR -XR) 75 MG 24 hr capsule TAKE 1 CAPSULE BY MOUTH TWICE DAILY WITH A MEAL 04/22/23   Donley Furth, MD       Allergies    Fentanyl, Levofloxacin, Amoxicillin , Augmentin  [amoxicillin -pot clavulanate], Farxiga  [dapagliflozin ], and Statins    Review of Systems   Review of Systems  Neurological:  Positive for syncope, weakness and headaches.  All other systems reviewed and are negative.   Physical Exam Updated Vital Signs BP (!) 203/109 (BP Location: Left Arm)   Pulse (!) 102   Temp 98.4 F (36.9 C) (Oral)   Resp 20   Ht 5\' 4"  (1.626 m)   Wt 55 kg   SpO2 98%   BMI 20.81 kg/m  Physical Exam Vitals and nursing note reviewed.  Constitutional:      General: She is not in acute distress.    Appearance: Normal appearance.  HENT:     Head: Normocephalic. Contusion and laceration present. No raccoon eyes or Battle's sign.     Jaw: No trismus, tenderness, swelling or pain on movement.     Right Ear: Hearing and tympanic membrane normal.     Left Ear: Hearing and tympanic membrane normal.     Nose: No nasal deformity or septal deviation.     Mouth/Throat:     Mouth: Mucous membranes are moist. Injury present.     Pharynx: Oropharynx is clear.     Comments: Bite injury noted to the right side of the tongue Eyes:     General: Lids are normal.     Extraocular Movements: Extraocular movements intact.     Conjunctiva/sclera: Conjunctivae normal.     Pupils: Pupils are equal, round, and reactive to light.  Cardiovascular:     Rate and Rhythm: Normal rate and regular rhythm.     Pulses: Normal pulses.          Radial pulses are 2+ on the right side and 2+ on the left side.       Dorsalis pedis pulses are 2+ on the right side and 2+ on the left side.       Posterior tibial pulses are 2+ on the right side and 2+ on the left side.     Heart sounds: Normal heart sounds. No murmur heard.    No friction rub. No gallop.  Pulmonary:     Effort: Pulmonary effort is normal.     Breath sounds: Normal breath sounds.  Chest:     Chest wall: No lacerations, deformity or crepitus.  Abdominal:      General: Abdomen is flat. Bowel sounds are normal.     Palpations: Abdomen is soft.     Tenderness: There is abdominal tenderness in the right upper quadrant and epigastric area.  Musculoskeletal:        General: Normal range of motion.     Cervical back: Normal range of motion. No rigidity or crepitus. Spinous process tenderness and muscular tenderness present.  Right lower leg: No edema.     Left lower leg: No edema.  Skin:    General: Skin is warm and dry.     Capillary Refill: Capillary refill takes less than 2 seconds.  Neurological:     General: No focal deficit present.     Mental Status: She is alert. Mental status is at baseline.  Psychiatric:        Mood and Affect: Mood normal.     ED Results / Procedures / Treatments   Labs (all labs ordered are listed, but only abnormal results are displayed) Labs Reviewed  CBG MONITORING, ED - Abnormal; Notable for the following components:      Result Value   Glucose-Capillary 148 (*)    All other components within normal limits  COMPREHENSIVE METABOLIC PANEL WITH GFR  CBC  ETHANOL  URINALYSIS, ROUTINE W REFLEX MICROSCOPIC  PROTIME-INR  AMMONIA  I-STAT CHEM 8, ED  I-STAT CG4 LACTIC ACID, ED  SAMPLE TO BLOOD BANK    EKG EKG Interpretation Date/Time:  Wednesday Oct 26 2023 22:02:07 EDT Ventricular Rate:  97 PR Interval:  155 QRS Duration:  97 QT Interval:  411 QTC Calculation: 523 R Axis:   23  Text Interpretation: Sinus tachycardia Atrial premature complexes Probable left atrial enlargement LVH with secondary repolarization abnormality Abnormal inferior Q waves Prolonged QT interval Since last tracing rate faster Confirmed by Trish Furl [phone]) on 10/26/2023 10:09:52 PM  Radiology No results found.  Procedures Procedures  {Document cardiac monitor, telemetry assessment procedure when appropriate:1}  Medications Ordered in ED Medications  Tdap (BOOSTRIX) injection 0.5 mL (has no administration in time range)     ED Course/ Medical Decision Making/ A&P   {   Click here for ABCD2, HEART and other calculatorsREFRESH Note before signing :1}                              Medical Decision Making Amount and/or Complexity of Data Reviewed Labs: ordered. Radiology: ordered.  Risk Prescription drug management.   Medical Decision Making:   FIFI BEASLEY is a 73 y.o. female who presented to the ED today with head swelling and wound after a fall detailed above.     Complete initial physical exam performed, notably the patient  was alert and oriented exam with primary complaint of of head pain to the back of her head.  She also endorses neck pain.  She has midline tenderness to the cervical spine , and a large hematoma to the occiput with a laceration that is actively bleeding.  Bleeding is able to be controlled with direct pressure utilizing ABD pad and Kerlix.  She further has tenderness to the right upper quadrant and epigastric regions.  Reviewed and confirmed nursing documentation for past medical history, family history, social history.    Initial Assessment:   With the patient's presentation of head pain after fall, most likely diagnosis is ***. Other diagnoses were considered including (but not limited to) . These are considered less likely due to history of present illness and physical exam findings.   {crccopa:[id number]}  Initial Plan:  Due to history of autoimmune hepatitis and syncope history obtain ammonia level Screening labs including CBC and Metabolic panel to evaluate for infectious or metabolic etiology of disease.  Assess lactate level Obtain CT imaging of the head and neck due to mechanism as well as due to obvious head wound and neck pain. Obtained CT scan  of the abdomen due to abdominal tenderness Urinalysis with reflex culture ordered to evaluate for UTI or relevant urologic/nephrologic pathology.  Initially manage blood pressure with single dose of labetalol Manage pain with IV  morphine CXR to evaluate for structural/infectious intrathoracic pathology.  EKG to evaluate for cardiac pathology Objective evaluation as below reviewed   Initial Study Results:   Laboratory  All laboratory results reviewed without evidence of clinically relevant pathology.   Exceptions include: Lactate is elevated to 3.3 as well as potassium is depleted to 2.6.  ***EKG EKG was reviewed independently. Rate, rhythm, axis, intervals all examined and without medically relevant abnormality. ST segments without concerns for elevations.    Radiology:  All images reviewed independently. ***Agree with radiology report at this time.   DG Chest Port 1 View Result Date: 10/26/2023 CLINICAL DATA:  Fall, chest are EXAM: PORTABLE CHEST 1 VIEW COMPARISON:  None Available. FINDINGS: Lungs are well expanded, symmetric, and clear. No pneumothorax or pleural effusion. Cardiac size within normal limits. Pulmonary vascularity is normal. Osseous structures are age-appropriate. No acute bone abnormality. IMPRESSION: 1. No active disease. Electronically Signed   By: Worthy Heads M.D.   On: 10/26/2023 22:26      Consults: Case discussed with ***.   Reassessment and Plan:   ***     {Document critical care time when appropriate:1} {Document review of labs and clinical decision tools ie heart score, Chads2Vasc2 etc:1}  {Document your independent review of radiology images, and any outside records:1} {Document your discussion with family members, caretakers, and with consultants:1} {Document social determinants of health affecting pt's care:1} {Document your decision making why or why not admission, treatments were needed:1} Final Clinical Impression(s) / ED Diagnoses Final diagnoses:  None    Rx / DC Orders ED Discharge Orders     None

## 2023-10-26 NOTE — ED Triage Notes (Addendum)
 Patient arrived with EMS from home wearing C-collar , lost her balance /felt dizzy and fell backwards this evening , hit her head against the floor , denies LOC , EMS reports occipital hematoma and scalp laceration , dressing applied prior to arrival , emesis x1 at arrival . History of lumbar compression spinal fracture . Hypertensive BP=188/92. She is not taking anticoagulant .

## 2023-10-27 ENCOUNTER — Inpatient Hospital Stay (HOSPITAL_COMMUNITY)

## 2023-10-27 ENCOUNTER — Encounter (HOSPITAL_COMMUNITY): Payer: Self-pay | Admitting: Internal Medicine

## 2023-10-27 DIAGNOSIS — K31819 Angiodysplasia of stomach and duodenum without bleeding: Secondary | ICD-10-CM | POA: Diagnosis present

## 2023-10-27 DIAGNOSIS — G40909 Epilepsy, unspecified, not intractable, without status epilepticus: Secondary | ICD-10-CM | POA: Diagnosis not present

## 2023-10-27 DIAGNOSIS — K754 Autoimmune hepatitis: Secondary | ICD-10-CM

## 2023-10-27 DIAGNOSIS — S065X0A Traumatic subdural hemorrhage without loss of consciousness, initial encounter: Secondary | ICD-10-CM | POA: Diagnosis present

## 2023-10-27 DIAGNOSIS — Y95 Nosocomial condition: Secondary | ICD-10-CM | POA: Diagnosis not present

## 2023-10-27 DIAGNOSIS — K7030 Alcoholic cirrhosis of liver without ascites: Secondary | ICD-10-CM | POA: Diagnosis present

## 2023-10-27 DIAGNOSIS — G40901 Epilepsy, unspecified, not intractable, with status epilepticus: Secondary | ICD-10-CM | POA: Diagnosis not present

## 2023-10-27 DIAGNOSIS — S069XAA Unspecified intracranial injury with loss of consciousness status unknown, initial encounter: Secondary | ICD-10-CM | POA: Diagnosis not present

## 2023-10-27 DIAGNOSIS — G40001 Localization-related (focal) (partial) idiopathic epilepsy and epileptic syndromes with seizures of localized onset, not intractable, with status epilepticus: Secondary | ICD-10-CM | POA: Diagnosis not present

## 2023-10-27 DIAGNOSIS — Z23 Encounter for immunization: Secondary | ICD-10-CM | POA: Diagnosis not present

## 2023-10-27 DIAGNOSIS — S02113A Unspecified occipital condyle fracture, initial encounter for closed fracture: Secondary | ICD-10-CM | POA: Diagnosis not present

## 2023-10-27 DIAGNOSIS — J189 Pneumonia, unspecified organism: Secondary | ICD-10-CM | POA: Diagnosis not present

## 2023-10-27 DIAGNOSIS — I471 Supraventricular tachycardia, unspecified: Secondary | ICD-10-CM

## 2023-10-27 DIAGNOSIS — M7989 Other specified soft tissue disorders: Secondary | ICD-10-CM | POA: Diagnosis not present

## 2023-10-27 DIAGNOSIS — A419 Sepsis, unspecified organism: Secondary | ICD-10-CM | POA: Diagnosis not present

## 2023-10-27 DIAGNOSIS — I6200 Nontraumatic subdural hemorrhage, unspecified: Secondary | ICD-10-CM | POA: Diagnosis present

## 2023-10-27 DIAGNOSIS — Z515 Encounter for palliative care: Secondary | ICD-10-CM | POA: Diagnosis not present

## 2023-10-27 DIAGNOSIS — E1165 Type 2 diabetes mellitus with hyperglycemia: Secondary | ICD-10-CM | POA: Diagnosis present

## 2023-10-27 DIAGNOSIS — J13 Pneumonia due to Streptococcus pneumoniae: Secondary | ICD-10-CM | POA: Diagnosis not present

## 2023-10-27 DIAGNOSIS — S069X9S Unspecified intracranial injury with loss of consciousness of unspecified duration, sequela: Secondary | ICD-10-CM | POA: Diagnosis not present

## 2023-10-27 DIAGNOSIS — S02119A Unspecified fracture of occiput, initial encounter for closed fracture: Secondary | ICD-10-CM | POA: Diagnosis present

## 2023-10-27 DIAGNOSIS — I350 Nonrheumatic aortic (valve) stenosis: Secondary | ICD-10-CM | POA: Diagnosis present

## 2023-10-27 DIAGNOSIS — I639 Cerebral infarction, unspecified: Secondary | ICD-10-CM | POA: Diagnosis present

## 2023-10-27 DIAGNOSIS — L89622 Pressure ulcer of left heel, stage 2: Secondary | ICD-10-CM | POA: Diagnosis not present

## 2023-10-27 DIAGNOSIS — S066XAD Traumatic subarachnoid hemorrhage with loss of consciousness status unknown, subsequent encounter: Secondary | ICD-10-CM | POA: Diagnosis not present

## 2023-10-27 DIAGNOSIS — R6521 Severe sepsis with septic shock: Secondary | ICD-10-CM | POA: Diagnosis not present

## 2023-10-27 DIAGNOSIS — R1310 Dysphagia, unspecified: Secondary | ICD-10-CM | POA: Diagnosis not present

## 2023-10-27 DIAGNOSIS — I82451 Acute embolism and thrombosis of right peroneal vein: Secondary | ICD-10-CM | POA: Diagnosis not present

## 2023-10-27 DIAGNOSIS — R569 Unspecified convulsions: Secondary | ICD-10-CM | POA: Diagnosis not present

## 2023-10-27 DIAGNOSIS — K219 Gastro-esophageal reflux disease without esophagitis: Secondary | ICD-10-CM | POA: Diagnosis present

## 2023-10-27 DIAGNOSIS — R9431 Abnormal electrocardiogram [ECG] [EKG]: Secondary | ICD-10-CM | POA: Diagnosis not present

## 2023-10-27 DIAGNOSIS — J9601 Acute respiratory failure with hypoxia: Secondary | ICD-10-CM | POA: Diagnosis not present

## 2023-10-27 DIAGNOSIS — G9340 Encephalopathy, unspecified: Secondary | ICD-10-CM | POA: Diagnosis not present

## 2023-10-27 DIAGNOSIS — R4701 Aphasia: Secondary | ICD-10-CM | POA: Diagnosis not present

## 2023-10-27 DIAGNOSIS — E876 Hypokalemia: Secondary | ICD-10-CM | POA: Diagnosis not present

## 2023-10-27 DIAGNOSIS — W1830XA Fall on same level, unspecified, initial encounter: Secondary | ICD-10-CM | POA: Diagnosis present

## 2023-10-27 DIAGNOSIS — S02119D Unspecified fracture of occiput, subsequent encounter for fracture with routine healing: Secondary | ICD-10-CM | POA: Diagnosis not present

## 2023-10-27 DIAGNOSIS — M810 Age-related osteoporosis without current pathological fracture: Secondary | ICD-10-CM | POA: Diagnosis present

## 2023-10-27 DIAGNOSIS — R55 Syncope and collapse: Secondary | ICD-10-CM

## 2023-10-27 DIAGNOSIS — S065XAA Traumatic subdural hemorrhage with loss of consciousness status unknown, initial encounter: Secondary | ICD-10-CM | POA: Diagnosis not present

## 2023-10-27 DIAGNOSIS — G8194 Hemiplegia, unspecified affecting left nondominant side: Secondary | ICD-10-CM | POA: Diagnosis present

## 2023-10-27 DIAGNOSIS — D649 Anemia, unspecified: Secondary | ICD-10-CM | POA: Diagnosis present

## 2023-10-27 DIAGNOSIS — Z7189 Other specified counseling: Secondary | ICD-10-CM | POA: Diagnosis not present

## 2023-10-27 DIAGNOSIS — F32A Depression, unspecified: Secondary | ICD-10-CM | POA: Diagnosis present

## 2023-10-27 DIAGNOSIS — I10 Essential (primary) hypertension: Secondary | ICD-10-CM | POA: Diagnosis present

## 2023-10-27 DIAGNOSIS — I959 Hypotension, unspecified: Secondary | ICD-10-CM | POA: Diagnosis not present

## 2023-10-27 DIAGNOSIS — S065XAD Traumatic subdural hemorrhage with loss of consciousness status unknown, subsequent encounter: Secondary | ICD-10-CM | POA: Diagnosis not present

## 2023-10-27 DIAGNOSIS — I160 Hypertensive urgency: Secondary | ICD-10-CM | POA: Diagnosis present

## 2023-10-27 DIAGNOSIS — E43 Unspecified severe protein-calorie malnutrition: Secondary | ICD-10-CM | POA: Diagnosis present

## 2023-10-27 DIAGNOSIS — M4856XA Collapsed vertebra, not elsewhere classified, lumbar region, initial encounter for fracture: Secondary | ICD-10-CM | POA: Diagnosis present

## 2023-10-27 DIAGNOSIS — F1010 Alcohol abuse, uncomplicated: Secondary | ICD-10-CM | POA: Diagnosis present

## 2023-10-27 DIAGNOSIS — W19XXXD Unspecified fall, subsequent encounter: Secondary | ICD-10-CM | POA: Diagnosis present

## 2023-10-27 DIAGNOSIS — Z794 Long term (current) use of insulin: Secondary | ICD-10-CM | POA: Diagnosis not present

## 2023-10-27 DIAGNOSIS — K7460 Unspecified cirrhosis of liver: Secondary | ICD-10-CM | POA: Diagnosis not present

## 2023-10-27 DIAGNOSIS — S061X0A Traumatic cerebral edema without loss of consciousness, initial encounter: Secondary | ICD-10-CM | POA: Diagnosis present

## 2023-10-27 DIAGNOSIS — Z8616 Personal history of COVID-19: Secondary | ICD-10-CM | POA: Diagnosis not present

## 2023-10-27 DIAGNOSIS — Y92019 Unspecified place in single-family (private) house as the place of occurrence of the external cause: Secondary | ICD-10-CM | POA: Diagnosis not present

## 2023-10-27 DIAGNOSIS — S066XAA Traumatic subarachnoid hemorrhage with loss of consciousness status unknown, initial encounter: Secondary | ICD-10-CM | POA: Diagnosis not present

## 2023-10-27 DIAGNOSIS — Z95828 Presence of other vascular implants and grafts: Secondary | ICD-10-CM | POA: Diagnosis not present

## 2023-10-27 DIAGNOSIS — J155 Pneumonia due to Escherichia coli: Secondary | ICD-10-CM | POA: Diagnosis not present

## 2023-10-27 DIAGNOSIS — E041 Nontoxic single thyroid nodule: Secondary | ICD-10-CM | POA: Diagnosis not present

## 2023-10-27 DIAGNOSIS — I82401 Acute embolism and thrombosis of unspecified deep veins of right lower extremity: Secondary | ICD-10-CM | POA: Diagnosis not present

## 2023-10-27 DIAGNOSIS — E785 Hyperlipidemia, unspecified: Secondary | ICD-10-CM | POA: Diagnosis present

## 2023-10-27 LAB — POCT I-STAT 7, (LYTES, BLD GAS, ICA,H+H)
Acid-base deficit: 4 mmol/L — ABNORMAL HIGH (ref 0.0–2.0)
Bicarbonate: 19.7 mmol/L — ABNORMAL LOW (ref 20.0–28.0)
Calcium, Ion: 0.99 mmol/L — ABNORMAL LOW (ref 1.15–1.40)
HCT: 30 % — ABNORMAL LOW (ref 36.0–46.0)
Hemoglobin: 10.2 g/dL — ABNORMAL LOW (ref 12.0–15.0)
O2 Saturation: 100 %
Patient temperature: 99.2
Potassium: 3.5 mmol/L (ref 3.5–5.1)
Sodium: 130 mmol/L — ABNORMAL LOW (ref 135–145)
TCO2: 21 mmol/L — ABNORMAL LOW (ref 22–32)
pCO2 arterial: 32.4 mmHg (ref 32–48)
pH, Arterial: 7.395 (ref 7.35–7.45)
pO2, Arterial: 285 mmHg — ABNORMAL HIGH (ref 83–108)

## 2023-10-27 LAB — URINALYSIS, ROUTINE W REFLEX MICROSCOPIC
Bacteria, UA: NONE SEEN
Bilirubin Urine: NEGATIVE
Bilirubin Urine: NEGATIVE
Glucose, UA: >=500 mg/dL — AB
Glucose, UA: >=500 mg/dL — AB
Hgb urine dipstick: NEGATIVE
Hgb urine dipstick: NEGATIVE
Ketones, ur: 20 mg/dL — AB
Ketones, ur: 20 mg/dL — AB
Leukocytes,Ua: NEGATIVE
Leukocytes,Ua: NEGATIVE
Nitrite: NEGATIVE
Nitrite: NEGATIVE
Protein, ur: 100 mg/dL — AB
Protein, ur: 30 mg/dL — AB
Specific Gravity, Urine: 1.022 (ref 1.005–1.030)
Specific Gravity, Urine: 1.018 (ref 1.005–1.030)
pH: 6 (ref 5.0–8.0)
pH: 6 (ref 5.0–8.0)

## 2023-10-27 LAB — TYPE AND SCREEN
ABO/RH(D): A NEG
Antibody Screen: NEGATIVE

## 2023-10-27 LAB — ECHOCARDIOGRAM COMPLETE
AR max vel: 1.18 cm2
AV Area VTI: 1.1 cm2
AV Area mean vel: 1.09 cm2
AV Mean grad: 32 mmHg
AV Peak grad: 42.6 mmHg
Ao pk vel: 3.26 m/s
Area-P 1/2: 4.37 cm2
Calc EF: 68.5 %
Height: 64 in
MV VTI: 2.64 cm2
S' Lateral: 2.4 cm
Single Plane A2C EF: 71.6 %
Single Plane A4C EF: 62.1 %
Weight: 1940.05 [oz_av]

## 2023-10-27 LAB — CBC
HCT: 36.9 % (ref 36.0–46.0)
Hemoglobin: 12.3 g/dL (ref 12.0–15.0)
MCH: 32.2 pg (ref 26.0–34.0)
MCHC: 33.3 g/dL (ref 30.0–36.0)
MCV: 96.6 fL (ref 80.0–100.0)
Platelets: 177 10*3/uL (ref 150–400)
RBC: 3.82 MIL/uL — ABNORMAL LOW (ref 3.87–5.11)
RDW: 13.5 % (ref 11.5–15.5)
WBC: 14.7 10*3/uL — ABNORMAL HIGH (ref 4.0–10.5)
nRBC: 0 % (ref 0.0–0.2)

## 2023-10-27 LAB — SAMPLE TO BLOOD BANK

## 2023-10-27 LAB — BASIC METABOLIC PANEL WITH GFR
Anion gap: 13 (ref 5–15)
BUN: 7 mg/dL — ABNORMAL LOW (ref 8–23)
CO2: 22 mmol/L (ref 22–32)
Calcium: 7.8 mg/dL — ABNORMAL LOW (ref 8.9–10.3)
Chloride: 100 mmol/L (ref 98–111)
Creatinine, Ser: 0.52 mg/dL (ref 0.44–1.00)
GFR, Estimated: >60 mL/min (ref >60)
Glucose, Bld: 291 mg/dL — ABNORMAL HIGH (ref 70–99)
Potassium: 2.9 mmol/L — ABNORMAL LOW (ref 3.5–5.1)
Sodium: 135 mmol/L (ref 135–145)

## 2023-10-27 LAB — DIC (DISSEMINATED INTRAVASCULAR COAGULATION)PANEL
D-Dimer, Quant: 5.7 ug{FEU}/mL — ABNORMAL HIGH (ref 0.00–0.50)
Fibrinogen: 397 mg/dL (ref 210–475)
INR: 1.2 (ref 0.8–1.2)
Platelets: 157 10*3/uL (ref 150–400)
Prothrombin Time: 15.7 s — ABNORMAL HIGH (ref 11.4–15.2)
Smear Review: NONE SEEN
aPTT: 22 s — ABNORMAL LOW (ref 24–36)

## 2023-10-27 LAB — LACTIC ACID, PLASMA
Lactic Acid, Venous: 2 mmol/L (ref 0.5–1.9)
Lactic Acid, Venous: 1.7 mmol/L (ref 0.5–1.9)

## 2023-10-27 LAB — SODIUM: Sodium: 135 mmol/L (ref 135–145)

## 2023-10-27 LAB — ABO/RH: ABO/RH(D): A NEG

## 2023-10-27 LAB — CBG MONITORING, ED: Glucose-Capillary: 284 mg/dL — ABNORMAL HIGH (ref 70–99)

## 2023-10-27 LAB — MAGNESIUM: Magnesium: 1.6 mg/dL — ABNORMAL LOW (ref 1.7–2.4)

## 2023-10-27 LAB — GLUCOSE, CAPILLARY
Glucose-Capillary: 266 mg/dL — ABNORMAL HIGH (ref 70–99)
Glucose-Capillary: 116 mg/dL — ABNORMAL HIGH (ref 70–99)
Glucose-Capillary: 251 mg/dL — ABNORMAL HIGH (ref 70–99)
Glucose-Capillary: 209 mg/dL — ABNORMAL HIGH (ref 70–99)

## 2023-10-27 LAB — HEMOGLOBIN A1C
Hgb A1c MFr Bld: 6.8 % — ABNORMAL HIGH (ref 4.8–5.6)
Mean Plasma Glucose: 148.46 mg/dL

## 2023-10-27 LAB — ETHANOL: Alcohol, Ethyl (B): <15 mg/dL (ref <15)

## 2023-10-27 LAB — MRSA NEXT GEN BY PCR, NASAL: MRSA by PCR Next Gen: NOT DETECTED

## 2023-10-27 MED ORDER — PANTOPRAZOLE SODIUM 40 MG IV SOLR
40.0000 mg | Freq: Every day | INTRAVENOUS | Status: DC
  Administered 2023-10-27 – 2023-11-10 (×15): 40 mg via INTRAVENOUS
  Filled 2023-10-27 (×15): qty 10

## 2023-10-27 MED ORDER — VENLAFAXINE HCL ER 75 MG PO CP24
75.0000 mg | ORAL_CAPSULE | Freq: Every day | ORAL | Status: DC
  Administered 2023-10-27: 75 mg via ORAL
  Filled 2023-10-27: qty 1

## 2023-10-27 MED ORDER — CHLORHEXIDINE GLUCONATE CLOTH 2 % EX PADS
6.0000 | MEDICATED_PAD | Freq: Every day | CUTANEOUS | Status: DC
  Administered 2023-10-27 – 2023-10-28 (×2): 6 via TOPICAL

## 2023-10-27 MED ORDER — SODIUM CHLORIDE 3 % IV BOLUS
250.0000 mL | Freq: Once | INTRAVENOUS | Status: DC
Start: 2023-10-27 — End: 2023-10-27
  Filled 2023-10-27: qty 500

## 2023-10-27 MED ORDER — DEXAMETHASONE SODIUM PHOSPHATE 10 MG/ML IJ SOLN
10.0000 mg | Freq: Once | INTRAMUSCULAR | Status: DC
  Filled 2023-10-27: qty 1

## 2023-10-27 MED ORDER — ADENOSINE 6 MG/2ML IV SOLN
6.0000 mg | Freq: Once | INTRAVENOUS | Status: DC
  Filled 2023-10-27: qty 2

## 2023-10-27 MED ORDER — LABETALOL HCL 5 MG/ML IV SOLN
10.0000 mg | INTRAVENOUS | Status: DC | PRN
  Administered 2023-10-27 (×2): 10 mg via INTRAVENOUS
  Filled 2023-10-27 (×2): qty 4

## 2023-10-27 MED ORDER — NOREPINEPHRINE 4 MG/250ML-% IV SOLN
0.0000 ug/min | INTRAVENOUS | Status: DC
  Administered 2023-10-28: 2 ug/min via INTRAVENOUS
  Administered 2023-10-29: 28 ug/min via INTRAVENOUS
  Administered 2023-10-29: 20 ug/min via INTRAVENOUS
  Administered 2023-10-29: 10 ug/min via INTRAVENOUS
  Administered 2023-10-30: 6 ug/min via INTRAVENOUS
  Filled 2023-10-27: qty 250
  Filled 2023-10-27: qty 500
  Filled 2023-10-27 (×4): qty 250

## 2023-10-27 MED ORDER — PROPOFOL 1000 MG/100ML IV EMUL
0.0000 ug/kg/min | INTRAVENOUS | Status: DC
  Administered 2023-10-27: 5 ug/kg/min via INTRAVENOUS
  Administered 2023-10-27 – 2023-10-28 (×2): 50 ug/kg/min via INTRAVENOUS
  Administered 2023-10-28: 30 ug/kg/min via INTRAVENOUS
  Administered 2023-10-28: 50 ug/kg/min via INTRAVENOUS
  Administered 2023-10-29: 40 ug/kg/min via INTRAVENOUS
  Administered 2023-10-29 – 2023-10-30 (×4): 50 ug/kg/min via INTRAVENOUS
  Filled 2023-10-27 (×11): qty 100

## 2023-10-27 MED ORDER — FENTANYL CITRATE PF 50 MCG/ML IJ SOSY
25.0000 ug | PREFILLED_SYRINGE | INTRAMUSCULAR | Status: DC | PRN
  Administered 2023-10-27 – 2023-10-29 (×5): 50 ug via INTRAVENOUS
  Administered 2023-10-30 – 2023-10-31 (×2): 25 ug via INTRAVENOUS
  Administered 2023-10-31 (×2): 50 ug via INTRAVENOUS
  Filled 2023-10-27 (×3): qty 1

## 2023-10-27 MED ORDER — FENTANYL 2500MCG IN NS 250ML (10MCG/ML) PREMIX INFUSION
0.0000 ug/h | INTRAVENOUS | Status: DC
  Administered 2023-10-27: 25 ug/h via INTRAVENOUS
  Administered 2023-10-28 – 2023-10-30 (×2): 100 ug/h via INTRAVENOUS
  Administered 2023-11-01: 50 ug/h via INTRAVENOUS
  Filled 2023-10-27 (×4): qty 250

## 2023-10-27 MED ORDER — MAGNESIUM SULFATE 2 GM/50ML IV SOLN
2.0000 g | Freq: Once | INTRAVENOUS | Status: AC
  Administered 2023-10-27: 2 g via INTRAVENOUS
  Filled 2023-10-27: qty 50

## 2023-10-27 MED ORDER — HYDRALAZINE HCL 20 MG/ML IJ SOLN
10.0000 mg | INTRAMUSCULAR | Status: DC | PRN
  Administered 2023-10-27: 10 mg via INTRAVENOUS
  Filled 2023-10-27: qty 1

## 2023-10-27 MED ORDER — ETOMIDATE 2 MG/ML IV SOLN: 15.0000 mg | Freq: Once | INTRAVENOUS | Status: DC

## 2023-10-27 MED ORDER — PHENYLEPHRINE HCL-NACL 20-0.9 MG/250ML-% IV SOLN
0.0000 ug/min | INTRAVENOUS | Status: DC
Start: 2023-10-27 — End: 2023-10-28

## 2023-10-27 MED ORDER — TRANEXAMIC ACID-NACL 1000-0.7 MG/100ML-% IV SOLN
1000.0000 mg | INTRAVENOUS | Status: AC
  Administered 2023-10-27: 1000 mg via INTRAVENOUS
  Filled 2023-10-27: qty 100

## 2023-10-27 MED ORDER — LEVETIRACETAM (KEPPRA) 500 MG/5 ML ADULT IV PUSH
1000.0000 mg | Freq: Two times a day (BID) | INTRAVENOUS | Status: DC
  Administered 2023-10-27 – 2023-10-28 (×3): 1000 mg via INTRAVENOUS
  Filled 2023-10-27 (×3): qty 10

## 2023-10-27 MED ORDER — MANNITOL 25 % IV SOLN
50.0000 g | Freq: Once | Status: AC
  Administered 2023-10-27: 50 g via INTRAVENOUS
  Filled 2023-10-27: qty 200

## 2023-10-27 MED ORDER — FENTANYL CITRATE PF 50 MCG/ML IJ SOSY
25.0000 ug | PREFILLED_SYRINGE | INTRAMUSCULAR | Status: AC | PRN
  Administered 2023-10-27 – 2023-10-29 (×3): 25 ug via INTRAVENOUS
  Filled 2023-10-27: qty 1

## 2023-10-27 MED ORDER — ROCURONIUM BROMIDE 10 MG/ML (PF) SYRINGE
PREFILLED_SYRINGE | INTRAVENOUS | Status: AC | PRN
  Administered 2023-10-27: 60 mg via INTRAVENOUS

## 2023-10-27 MED ORDER — MIDAZOLAM HCL 2 MG/2ML IJ SOLN
2.0000 mg | Freq: Once | INTRAMUSCULAR | Status: DC
  Filled 2023-10-27: qty 2

## 2023-10-27 MED ORDER — CALCIUM GLUCONATE-NACL 2-0.675 GM/100ML-% IV SOLN
2.0000 g | Freq: Once | INTRAVENOUS | Status: AC
  Administered 2023-10-27: 2000 mg via INTRAVENOUS
  Filled 2023-10-27: qty 100

## 2023-10-27 MED ORDER — FENTANYL CITRATE PF 50 MCG/ML IJ SOSY
50.0000 ug | PREFILLED_SYRINGE | Freq: Once | INTRAMUSCULAR | Status: DC
  Filled 2023-10-27: qty 1

## 2023-10-27 MED ORDER — HYDROMORPHONE HCL 1 MG/ML IJ SOLN
0.5000 mg | INTRAMUSCULAR | Status: DC | PRN
  Administered 2023-10-27: 0.5 mg via INTRAVENOUS
  Filled 2023-10-27: qty 1

## 2023-10-27 MED ORDER — TRAZODONE HCL 50 MG PO TABS: 50.0000 mg | ORAL_TABLET | Freq: Every evening | ORAL | Status: DC | PRN

## 2023-10-27 MED ORDER — SODIUM CHLORIDE 3 % IV SOLN
INTRAVENOUS | Status: DC
  Filled 2023-10-27: qty 500

## 2023-10-27 MED ORDER — EZETIMIBE 10 MG PO TABS
10.0000 mg | ORAL_TABLET | Freq: Every day | ORAL | Status: DC
  Administered 2023-10-28: 10 mg via ORAL
  Filled 2023-10-27: qty 1

## 2023-10-27 MED ORDER — INSULIN ASPART 100 UNIT/ML IJ SOLN
0.0000 [IU] | INTRAMUSCULAR | Status: DC
  Administered 2023-10-27 (×2): 11 [IU] via SUBCUTANEOUS
  Administered 2023-10-28: 4 [IU] via SUBCUTANEOUS
  Administered 2023-10-28: 7 [IU] via SUBCUTANEOUS
  Administered 2023-10-28: 3 [IU] via SUBCUTANEOUS
  Administered 2023-10-28 – 2023-10-29 (×3): 4 [IU] via SUBCUTANEOUS
  Administered 2023-10-29 (×2): 15 [IU] via SUBCUTANEOUS
  Administered 2023-10-29: 20 [IU] via SUBCUTANEOUS
  Administered 2023-10-29: 4 [IU] via SUBCUTANEOUS
  Administered 2023-10-29: 11 [IU] via SUBCUTANEOUS
  Administered 2023-10-30: 15 [IU] via SUBCUTANEOUS
  Administered 2023-10-30: 11 [IU] via SUBCUTANEOUS
  Administered 2023-10-30: 15 [IU] via SUBCUTANEOUS
  Administered 2023-10-30: 4 [IU] via SUBCUTANEOUS
  Administered 2023-10-30: 15 [IU] via SUBCUTANEOUS
  Administered 2023-10-31: 4 [IU] via SUBCUTANEOUS
  Administered 2023-10-31: 7 [IU] via SUBCUTANEOUS
  Administered 2023-10-31: 11 [IU] via SUBCUTANEOUS
  Administered 2023-10-31: 7 [IU] via SUBCUTANEOUS
  Administered 2023-10-31: 11 [IU] via SUBCUTANEOUS
  Administered 2023-10-31: 4 [IU] via SUBCUTANEOUS
  Administered 2023-11-01 (×2): 7 [IU] via SUBCUTANEOUS
  Administered 2023-11-01 (×2): 4 [IU] via SUBCUTANEOUS
  Administered 2023-11-01: 7 [IU] via SUBCUTANEOUS
  Administered 2023-11-01: 4 [IU] via SUBCUTANEOUS
  Administered 2023-11-02: 7 [IU] via SUBCUTANEOUS
  Administered 2023-11-02: 4 [IU] via SUBCUTANEOUS
  Administered 2023-11-02 (×2): 11 [IU] via SUBCUTANEOUS
  Administered 2023-11-02 (×2): 4 [IU] via SUBCUTANEOUS
  Administered 2023-11-03: 11 [IU] via SUBCUTANEOUS
  Administered 2023-11-03: 4 [IU] via SUBCUTANEOUS
  Administered 2023-11-03 (×2): 7 [IU] via SUBCUTANEOUS
  Administered 2023-11-03: 4 [IU] via SUBCUTANEOUS
  Administered 2023-11-03: 3 [IU] via SUBCUTANEOUS
  Administered 2023-11-04 (×3): 7 [IU] via SUBCUTANEOUS
  Administered 2023-11-04: 4 [IU] via SUBCUTANEOUS
  Administered 2023-11-04: 7 [IU] via SUBCUTANEOUS
  Administered 2023-11-04: 4 [IU] via SUBCUTANEOUS
  Administered 2023-11-05 (×2): 7 [IU] via SUBCUTANEOUS
  Administered 2023-11-05: 4 [IU] via SUBCUTANEOUS
  Administered 2023-11-05: 7 [IU] via SUBCUTANEOUS
  Administered 2023-11-05 – 2023-11-06 (×5): 4 [IU] via SUBCUTANEOUS
  Administered 2023-11-06: 7 [IU] via SUBCUTANEOUS
  Administered 2023-11-06: 4 [IU] via SUBCUTANEOUS
  Administered 2023-11-07 (×3): 3 [IU] via SUBCUTANEOUS
  Administered 2023-11-07: 4 [IU] via SUBCUTANEOUS
  Administered 2023-11-07 (×2): 7 [IU] via SUBCUTANEOUS
  Administered 2023-11-07 – 2023-11-08 (×3): 4 [IU] via SUBCUTANEOUS
  Administered 2023-11-08 (×2): 3 [IU] via SUBCUTANEOUS
  Administered 2023-11-08 – 2023-11-09 (×2): 4 [IU] via SUBCUTANEOUS
  Administered 2023-11-09: 3 [IU] via SUBCUTANEOUS
  Administered 2023-11-09: 7 [IU] via SUBCUTANEOUS
  Administered 2023-11-09: 3 [IU] via SUBCUTANEOUS
  Administered 2023-11-09: 4 [IU] via SUBCUTANEOUS
  Administered 2023-11-09 – 2023-11-10 (×3): 7 [IU] via SUBCUTANEOUS
  Administered 2023-11-10: 4 [IU] via SUBCUTANEOUS
  Administered 2023-11-10: 11 [IU] via SUBCUTANEOUS
  Administered 2023-11-10: 7 [IU] via SUBCUTANEOUS
  Administered 2023-11-11: 3 [IU] via SUBCUTANEOUS
  Administered 2023-11-11: 15 [IU] via SUBCUTANEOUS

## 2023-10-27 MED ORDER — HYDROCORTISONE SOD SUC (PF) 100 MG IJ SOLR
100.0000 mg | Freq: Every day | INTRAMUSCULAR | Status: DC
  Administered 2023-10-27 – 2023-10-31 (×5): 100 mg via INTRAVENOUS
  Filled 2023-10-27 (×6): qty 2

## 2023-10-27 MED ORDER — POTASSIUM CHLORIDE 10 MEQ/100ML IV SOLN
10.0000 meq | INTRAVENOUS | Status: AC
  Administered 2023-10-27 (×6): 10 meq via INTRAVENOUS
  Filled 2023-10-27 (×6): qty 100

## 2023-10-27 MED ORDER — LEVETIRACETAM (KEPPRA) 500 MG/5 ML ADULT IV PUSH
2000.0000 mg | INTRAVENOUS | Status: AC
  Administered 2023-10-27: 2000 mg via INTRAVENOUS
  Filled 2023-10-27: qty 20

## 2023-10-27 MED ORDER — INSULIN ASPART 100 UNIT/ML IJ SOLN
0.0000 [IU] | Freq: Three times a day (TID) | INTRAMUSCULAR | Status: DC
  Administered 2023-10-27: 5 [IU] via SUBCUTANEOUS

## 2023-10-27 MED ORDER — CHLORHEXIDINE GLUCONATE CLOTH 2 % EX PADS
6.0000 | MEDICATED_PAD | Freq: Every day | CUTANEOUS | Status: DC
  Administered 2023-10-27: 6 via TOPICAL

## 2023-10-27 MED ORDER — PREDNISONE 5 MG PO TABS
10.0000 mg | ORAL_TABLET | Freq: Every day | ORAL | Status: DC
  Administered 2023-10-27: 10 mg via ORAL
  Filled 2023-10-27: qty 2

## 2023-10-27 MED ORDER — ROCURONIUM BROMIDE 10 MG/ML (PF) SYRINGE: 60.0000 mg | PREFILLED_SYRINGE | Freq: Once | INTRAVENOUS | Status: AC

## 2023-10-27 MED ORDER — LISINOPRIL 10 MG PO TABS: 10.0000 mg | ORAL_TABLET | Freq: Every day | ORAL | Status: DC

## 2023-10-27 MED ORDER — PANTOPRAZOLE SODIUM 20 MG PO TBEC: 20.0000 mg | DELAYED_RELEASE_TABLET | Freq: Every day | ORAL | Status: DC

## 2023-10-27 MED ORDER — VITAMIN B-12 1000 MCG PO TABS
500.0000 ug | ORAL_TABLET | Freq: Every day | ORAL | Status: DC
  Administered 2023-10-28: 500 ug via ORAL
  Filled 2023-10-27: qty 1

## 2023-10-27 MED ORDER — ETOMIDATE 2 MG/ML IV SOLN
INTRAVENOUS | Status: AC | PRN
  Administered 2023-10-27: 15 mg via INTRAVENOUS

## 2023-10-27 MED ORDER — HYDRALAZINE HCL 20 MG/ML IJ SOLN
10.0000 mg | INTRAMUSCULAR | Status: DC | PRN
  Administered 2023-10-27 – 2023-11-01 (×5): 10 mg via INTRAVENOUS
  Filled 2023-10-27 (×5): qty 1

## 2023-10-27 NOTE — Consult Note (Signed)
 NEUROLOGY CONSULT NOTE   Date of service: Oct 27, 2023 Patient Name: Miranda Cochran MRN:  [id number] DOB:  February 29, 1951 Chief Complaint: "seizure " Requesting Provider: Josiah Nigh, MD  History of Present Illness  Miranda Cochran is a 73 y.o. female with hx of autoimmune hepatitis, cirrhosis of the liver, hypertension, depression, GERD, and hyperlipidemia. Patient arrived yesterday via EMS for evaluation after a fall and hit her head yesterday.    At that time patient reportedly felt suddenly weak, dizzy and was also seeing flashing in her vision. Patient admitted for TBI and with CT significant for evidence of occipital skull fracture and traumatic subarachnoid hemorrhage and right subdural hematoma. Neurosurgery was consulted for further evaluation, with plan to repeat CT scan later and observation. Following morning CT did demonstrate increasing right subdural hematoma.  Per EDP documentation on 5/15 patient experienced mental status changes by nursing. Where she seemed to be more sleepy, "slumped over" and patient's heart rate in the 160s. Cardiology was in agreement that heart rates did not appear to show SVT, CCM was consulted and took over as primary team.  Neurology was consulted in the setting of suspected seizure-like activity due to hematomas. Prior to assessment, had a seizure and had increased left sided weakness > than right. Patient was encephalopathic and unable to answer questions.   Upon arrival for initial assessment, patient was already intubated and unable to participate in questioning or physical exam.  History primarily obtained via chart review and through discussion with CCM.    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   GERD (gastroesophageal reflux disease)    on meds   Hiatal hernia    Hx of adenomatous polyp of colon 07/07/2009   Hyperlipidemia    diet controlled   Hypertension    on meds - LASIX    IBS (irritable bowel syndrome)    Iron  deficiency anemia    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 Willy Harvest, adenimatous polyps, repeat in 5 yrs   ESOPHAGOGASTRODUODENOSCOPY  07/06/2021   per Dr. Willy Harvest, showed portal hypertensive gastropathy, repeat in 2 yrs   ESOPHAGOGASTRODUODENOSCOPY (EGD) WITH PROPOFOL  N/A 06/20/2017   Procedure: ESOPHAGOGASTRODUODENOSCOPY (EGD) WITH PROPOFOL ;  Surgeon: Kenney Peacemaker, MD;  Location: [facility];  Service: Endoscopy;  Laterality: N/A;  with Barrx   EYE SURGERY     NASAL SEPTOPLASTY W/ TURBINOPLASTY     POLYPECTOMY  2015   CG-TA   TONSILLECTOMY      Family History: Family History  Problem Relation Age of Onset   Stroke Mother    Hypertension Mother    Colon cancer Father 80   Liver cancer Father 40   Colon polyps Father 73   Heart disease Sister        hole in heart   Pancreatic cancer Maternal Aunt    Brain cancer Maternal Uncle    Stroke Maternal Grandmother    Hypertension Maternal Grandmother    Esophageal cancer Maternal Grandfather    Throat cancer Maternal Grandfather    Breast cancer  Paternal Grandmother    Brain cancer Paternal Grandmother    Lung cancer Paternal Grandfather    Rectal cancer Neg Hx    Stomach cancer Neg Hx     Social History  reports that she has never smoked. She has never used smokeless tobacco. She reports current alcohol use. She reports that she does not use drugs.  Allergies  Allergen Reactions   Fentanyl Anxiety    Made her feel very anxious, requests not to get it.   Levofloxacin Anxiety    Weird dreams    Amoxicillin  Diarrhea    Has patient had a PCN reaction causing immediate rash, facial/tongue/throat swelling, SOB or lightheadedness with hypotension: No Has patient had a PCN reaction causing severe rash involving mucus membranes or skin necrosis: No Has patient had a PCN reaction that required hospitalization: No Has patient had a PCN reaction occurring within the last 10 years: Yes If all of the above answers are "NO", then may proceed with Cephalosporin use.    Augmentin  [Amoxicillin -Pot Clavulanate] Other (See Comments)    diarrhea   Farxiga  [Dapagliflozin ] Diarrhea    Gi pain   Statins     Myalgia     Medications   Current Facility-Administered Medications:    cyanocobalamin (VITAMIN B12) tablet 500 mcg, 500 mcg, Oral, Daily, Kakrakandy, Arshad N, MD   etomidate (AMIDATE) injection, , Intravenous, Code/Trauma/Sedation Med, Minor, Lavelle Posey, NP, 15 mg at 10/27/23 1053   ezetimibe  (ZETIA ) tablet 10 mg, 10 mg, Oral, Daily, Kakrakandy, Arshad N, MD   fentaNYL (SUBLIMAZE) injection 25 mcg, 25 mcg, Intravenous, Q15 min PRN, Josiah Nigh, MD   fentaNYL (SUBLIMAZE) injection 25-100 mcg, 25-100 mcg, Intravenous, Q30 min PRN, Josiah Nigh, MD   fentaNYL (SUBLIMAZE) injection 50 mcg, 50 mcg, Intravenous, Once, Josiah Nigh, MD   hydrALAZINE (APRESOLINE) injection 10 mg, 10 mg, Intravenous, Q4H PRN, Akula, Vijaya, MD   hydrocortisone sodium succinate (SOLU-CORTEF) 100 MG injection 100 mg, 100 mg, Intravenous, Daily, Josiah Nigh, MD   insulin  aspart (novoLOG) injection 0-20 Units, 0-20 Units, Subcutaneous, Q4H, Josiah Nigh, MD   levETIRAcetam (KEPPRA) undiluted injection 2,000 mg, 2,000 mg, Intravenous, STAT, Josiah Nigh, MD   magnesium sulfate IVPB 2 g 50 mL, 2 g, Intravenous, Once, Josiah Nigh, MD   magnesium sulfate IVPB 2 g 50 mL, 2 g, Intravenous, Once, Josiah Nigh, MD   mannitol 25 % IVPB 50 g, 50 g, Intravenous, Once, Josiah Nigh, MD   midazolam  (VERSED ) injection 2 mg, 2 mg, Intravenous, Once, Josiah Nigh, MD   norepinephrine (LEVOPHED) 4mg  in (0.016 mg/mL) premix infusion, 0-40 mcg/min, Intravenous, Titrated, Josiah Nigh, MD   pantoprazole (PROTONIX) injection 40 mg, 40 mg, Intravenous, QHS, Josiah Nigh, MD   phenylephrine (NEO-SYNEPHRINE) 20mg /NS 250mL premix infusion, 0-400 mcg/min, Intravenous, Titrated, Felipe Horton,  Tino Foreman, MD   potassium chloride  10 mEq in 100 mL IVPB, 10 mEq, Intravenous, Q1 Hr x 6, Angelene Kelly, MD, Last Rate: 100 mL/hr at 10/27/23 1044, 10 mEq at 10/27/23 1044   propofol  (DIPRIVAN ) 1000 MG/100ML infusion, 0-50 mcg/kg/min, Intravenous, Continuous, Josiah Nigh, MD   rocuronium (ZEMURON) injection 60 mg, 60 mg, Intravenous, Once, Josiah Nigh, MD   rocuronium (ZEMURON) injection, , Intravenous, Code/Trauma/Sedation Med, Minor, Lavelle Posey, NP, 60 mg at 10/27/23 1054   tranexamic acid (CYKLOKAPRON) IVPB 1,000 mg, 1,000 mg, Intravenous, STAT, Josiah Nigh, MD  Current Outpatient Medications:    ALPRAZolam  (XANAX ) 0.5 MG tablet, TAKE 1 TABLET(0.5 MG) BY MOUTH AT BEDTIME AS NEEDED FOR SLEEP, Disp: 90 tablet, Rfl: 1   AMBULATORY NON FORMULARY MEDICATION, ACTIVE LIVER Artichoke leaf extract, Milk thistle, Turmeric Take 1 capsule by mouth once daily, Disp: , Rfl:    aspirin  325 MG tablet, Take 650 mg by mouth daily as needed for moderate pain (pain score 4-6) or headache., Disp: , Rfl:    cetirizine  (ZYRTEC ) 10 MG tablet, Take 10 mg by  mouth daily., Disp: , Rfl:    Cholecalciferol 25 MCG (1000 UT) CHEW, Chew 1,000 Units by mouth daily., Disp: , Rfl:    Cyanocobalamin 500 MCG CHEW, Chew 500 mcg by mouth daily., Disp: , Rfl:    diphenhydrAMINE  (BENADRYL ) 25 mg capsule, Take 25-50 mg by mouth daily as needed for allergies. , Disp: , Rfl:    ezetimibe  (ZETIA ) 10 MG tablet, TAKE 1 TABLET(10 MG) BY MOUTH DAILY, Disp: 30 tablet, Rfl: 3   furosemide  (LASIX ) 40 MG tablet, TAKE 1 TABLET(40 MG) BY MOUTH DAILY, Disp: 90 tablet, Rfl: 3   ibuprofen (ADVIL) 200 MG tablet, Take 400 mg by mouth as needed for mild pain (pain score 1-3) or moderate pain (pain score 4-6)., Disp: , Rfl:    insulin  lispro (HUMALOG ) 100 UNIT/ML KwikPen, 18-22 Units daily with lunch., Disp: , Rfl:    lansoprazole  (PREVACID ) 30 MG capsule, TAKE 1 CAPSULE(30 MG) BY MOUTH DAILY, Disp: 90 capsule, Rfl: 3   lisinopril  (ZESTRIL ) 10 MG tablet, Take 1 tablet (10 mg total) by mouth daily., Disp: 90 tablet, Rfl: 0   methocarbamol (ROBAXIN) 500 MG tablet, Take 500 mg by mouth every 6 (six) hours as needed., Disp: , Rfl:    Multiple Vitamin (MULTIVITAMIN PO), Take by mouth., Disp: , Rfl:    naloxone (NARCAN) nasal spray 4 mg/0.1 mL, Spray the contents of 1 device into 1 nostril. Call 911. May repeat with 2nd device in alternate nostril if no response in 2-3 minutes., Disp: , Rfl:    predniSONE (DELTASONE) 10 MG tablet, Take 10 mg by mouth daily with breakfast., Disp: , Rfl:    RYBELSUS 14 MG TABS, Take 1 tablet by mouth every morning., Disp: , Rfl:    traZODone  (DESYREL ) 50 MG tablet, TAKE 1 TABLET BY MOUTH EVERYDAY AT BEDTIME (Patient taking differently: Take 50 mg by mouth at bedtime as needed for sleep.), Disp: 90 tablet, Rfl: 1   tretinoin (RETIN-A) 0.025 % cream, Apply topically at bedtime., Disp: , Rfl:    venlafaxine  XR (EFFEXOR -XR) 75 MG 24 hr capsule, TAKE 1 CAPSULE BY MOUTH TWICE DAILY WITH A MEAL (Patient taking differently: Take 75 mg by mouth. TAKE 1 CAPSULE BY MOUTH  DAILY WITH A MEAL), Disp: 180 capsule, Rfl: 3  Vitals   Vitals:   10/27/23 1004 10/27/23 1030 10/27/23 1045 10/27/23 1055  BP:  (!) 141/91  92/71 (!) 84/67  Pulse:  80 (!) 150 (!) 145  Resp:  (!) 29 (!) 21 (!) 34  Temp: 99.2 F (37.3 C)     TempSrc:      SpO2:  100% 99% 100%  Weight:      Height:        Body mass index is 20.81 kg/m.  Physical Exam   Constitutional: Appears ill,  Eyes: No scleral injection.  HENT: Currently intubated Head: Large scalp hematoma, with dressing in place. Cardiovascular: Tachycardic  Neurologic Examination  Limited exam due to chemical paralysis Neurological: Pupils equal and reactive to light, no tracking, no spontaneous movement, no movement stimulation-received paralytics for intubation.  Labs/Imaging/Neurodiagnostic studies   CBC:  Recent Labs  Cochran 11/08/23 2150 2023-11-08 2227 10/27/23 0504  WBC 16.5*  --  14.7*  HGB 14.2 14.3 12.3  HCT 42.1 42.0 36.9  MCV 96.3  --  96.6  PLT 198  --  177   Basic Metabolic Panel:  Cochran Results  Component Value Date   NA 135 10/27/2023   K 2.9 (L) 10/27/2023   CO2 22 10/27/2023   GLUCOSE 291 (H) 10/27/2023   BUN 7 (L) 10/27/2023   CREATININE 0.52 10/27/2023   CALCIUM  7.8 (L) 10/27/2023   GFRNONAA >60 10/27/2023   GFRAA >89 11/01/2015   Lipid Panel:  Cochran Results  Component Value Date   LDLCALC 113 (H) 02/25/2022   HgbA1c:  Cochran Results  Component Value Date   HGBA1C 6.8 (H) 10/27/2023   Urine Drug Screen:  INR  Cochran Results  Component Value Date   INR 1.1 08-Nov-2023   APTT  Cochran Results  Component Value Date   APTT 35 08/13/2016   CT Head without contrast(Personally reviewed): 08-Nov-2023 IMPRESSION: 1. Mixed subdural and subarachnoid hemorrhage of the right hemisphere, predominantly anterior. No midline shift or other mass effect. 2. Nondisplaced left occipital skull fracture with large left posterior scalp hematoma. 3. No acute fracture or static subluxation of the cervical  spine.  5/15 1. Interval increase in size of an acute subdural hematoma along the right cerebral convexity (greatest along the anterior right frontal lobe), now measuring up to 9 mm in thickness (previously 7 mm). 2. Acute subarachnoid hemorrhage scattered along the right cerebral convexity, increased. 3. Hemorrhagic parenchymal contusions within the anterior right temporal lobe and anteroinferior right frontal lobe have increased in size. 4. Trace acute subarachnoid hemorrhage along the anterolateral left temporal lobe, new from the prior exam. Possible small underlying left temporal lobe hemorrhagic parenchymal contusions. 5. A 2.3 x 2.6 cm hemorrhagic parenchymal contusion within the left cerebellar hemisphere has increased in size (previously 2.1 x 1.8 cm). 6. Thin subdural hemorrhage layering along the tentorium, bilaterally, similar to the prior exam. 7. Trace subarachnoid hemorrhage (versus prominent choroid plexus) within the inferior aspect of the fourth ventricle. 8. Non-depressed fracture of the left occipital calvarium. 9. Left occipital scalp hematoma and laceration.  CT C-spine 11/08/23)  1. Mixed subdural and subarachnoid hemorrhage of the right hemisphere, predominantly anterior. No midline shift or other mass effect. 2. Nondisplaced left occipital skull fracture with large left posterior scalp hematoma. 3. No acute fracture or static subluxation of the cervical spine.   Neurodiagnostics cEEG pending  ASSESSMENT   Miranda Cochran is a 73 y.o. female with hx of autoimmune hepatitis, cirrhosis of the liver, hypertension, depression, GERD, and hyperlipidemia. Patient arrived yesterday via EMS for evaluation after a fall and hit her head yesterday.    Imaging  concerning for occipital fracture with acute subarachnoid hemorrhage, multiple contusions and a right subdural hematoma. Most recent CT appears with interval increase in right subdural hematoma up to 9 mm from 7 mm  previously.  We will repeat imaging and monitor for seizure-like activity on continuous EEG overnight. She was loaded with AEDs and is currently intubated, with CCM following as primary team  Impression: Suspect seizure like activity in the setting of increasing subdural hematoma, TBI  RECOMMENDATIONS  --Repeat CT ordered  --Continuous EEG, to assess for seizure-like activity - Load with Keppra 2 g today, with plan to start 1 g daily  - One dose Mannitol 50g (not too much cerebral edema but will preemptively treat in discussion with CCM) -- Neurology following  ______________________________________________________________________  Johnn Najjar, MD [facility] psychiatry resident-PGY 1   Attending Neurohospitalist Addendum Patient seen and examined with APP/Resident. Agree with the history and physical as documented above. Agree with the plan as documented, which I helped formulate. I have independently reviewed the chart, obtained history, review of systems and examined the patient.I have personally reviewed pertinent head/neck/spine imaging (CT/MRI). Please feel free to call with any questions.  -- Tona Francis, MD Neurologist [facility] Pager: [phone]  CRITICAL CARE ATTESTATION Performed by: Tona Francis, MD Total critical care time: 33 minutes Critical care time was exclusive of separately billable procedures and treating other patients and/or supervising APPs/Residents/Students Critical care was necessary to treat or prevent imminent or life-threatening deterioration. This patient is critically ill and at significant risk for neurological worsening and/or death and care requires constant monitoring. Critical care was time spent personally by me on the following activities: development of treatment plan with patient and/or surrogate as well as nursing, discussions with consultants, evaluation of patient's response to treatment, examination of  patient, obtaining history from patient or surrogate, ordering and performing treatments and interventions, ordering and review of laboratory studies, ordering and review of radiographic studies, pulse oximetry, re-evaluation of patient's condition, participation in multidisciplinary rounds and medical decision making of high complexity in the care of this patient.

## 2023-10-27 NOTE — ED Notes (Signed)
 PT OTF with CT

## 2023-10-27 NOTE — ED Provider Notes (Signed)
 10:05 AM Was called to the bedside by nursing for a mental status change.  Patient reportedly had walked the bathroom then after standing up had slumped over.  She is more sleepy at this time but is still able to answer some questions to voice.  Patient's heart rate is now going to the 160s.  EKG shows SVT and there is no history of A-fib.  It is between 150 and 160 with a rate.  On my exam she does have weakness in her left arm and left leg although family reports this is similar to what it was before.  She recently had a head CT that was repeated that showed worsening intracranial bleeding.  Will call cardiology to have them see if this appears to SVT versus A-fib with RVR on their EKG review and will also call neurosurgery to let them know that she is worsening with the worsening bleeding.  Will discuss if we need to upgrade her level of care given the vital sign changes and mental status worsening as she may need ICU level care now.  10:23 AM Spoke to the admitting team who will call neurosurgery and will also call the critical care team.  We will give adenosine to try to break this SVT.  I spoke to cardiology who agreed this does appear to show SVT and they agreed with attempt at conversion.  10:40 AM Critical care team to the bedside and I will defer further management to them.  Adenosine was at the bedside if they decided to use it.  CRITICAL CARE Performed by: Marine Sia Donavan Kerlin Total critical care time: 20 minutes Critical care time was exclusive of separately billable procedures and treating other patients. Critical care was necessary to treat or prevent imminent or life-threatening deterioration. Critical care was time spent personally by me on the following activities: development of treatment plan with patient and/or surrogate as well as nursing, discussions with consultants, evaluation of patient's response to treatment, examination of patient, obtaining history from patient or  surrogate, ordering and performing treatments and interventions, ordering and review of laboratory studies, ordering and review of radiographic studies, pulse oximetry and re-evaluation of patient's condition.    Matheu Ploeger, Marine Sia, MD 10/27/23 1040

## 2023-10-27 NOTE — Progress Notes (Signed)
 [name], female   DOB: 1950/08/19, 73 y.o.   MRN: [id number]   The patient was intubated.   I have reviewed the patient's follow-up head CT.  Her left cerebellar right frontal and right temporal 2 regions have enlarged a bit.  There is no hydrocephalus or midline shift.  Her subdural hematoma has slightly enlarged.  .  I would recommend minimizing sedation and doing hourly neurochecks.  We will plan to repeat her head CT scan tomorrow, or sooner if she should worsen in the interval.

## 2023-10-27 NOTE — ED Notes (Signed)
 Trauma Event Note  Patient presented as a Level 2 trauma yesterday PM and was A&Ox4, GCS 15. Upon my arrival and assessment patient had progressively declined with uncontrolled BP for the majority of the night. CCM was consulted and planned intubation for airway protection. Medication given for hypertension. Assisted CCM Siegfried Dress Minor NP and Felipe Horton MD with intubation, central line placement. Patient was transported to 4N24.  Trending CBC Recent Labs    10/26/23 2150 10/26/23 2227 10/27/23 0504  WBC 16.5*  --  14.7*  HGB 14.2 14.3 12.3  HCT 42.1 42.0 36.9  PLT 198  --  177   Trending Coag's Recent Labs    10/26/23 2150  INR 1.1   Trending BMET Recent Labs    10/26/23 2150 10/26/23 2227 10/27/23 0504  NA 138 141 135  K 2.6* 2.6* 2.9*  CL 102 102 100  CO2 25  --  22  BUN 6* 8 7*  CREATININE 0.62 0.50 0.52  GLUCOSE 133* 133* 291*   Roxane Puerto L Elbie Statzer  Trauma Response RN  Please call TRN at [phone] for further assistance.

## 2023-10-27 NOTE — Progress Notes (Signed)
 Pt transported from ED22 to 4N24 by RT and RN x2 w/o complications

## 2023-10-27 NOTE — Consult Note (Signed)
 Reason for Consult: Right subdural hematoma Referring Physician: Dr. Lavella Poser is an 73 y.o. female.  HPI: The patient is a 73 year old white female with multiple medical problems including autoimmune hepatitis, cirrhosis, hypertension, depression, GERD, hyperlipidemia, etc. she took a fall after feeling dizzy yesterday striking her head.  She was brought to [facility] emergency department via EMS.  A head CT demonstrated a left occipital skull fracture and a right subdural hematoma.  A neurosurgical consultation was requested.  The patient has been admitted by Dr. [facility] Lavender for management of her medical issues.  A follow-up head CT is pending.  Presently the patient is accompanied by her husband.  She is somnolent but has been recently given pain medications.  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   GERD (gastroesophageal reflux disease)    on meds   Hiatal hernia    Hx of adenomatous polyp of colon 07/07/2009   Hyperlipidemia    diet controlled   Hypertension    on meds - LASIX    IBS (irritable bowel syndrome)    Iron  deficiency anemia    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 Willy Harvest,  adenimatous polyps, repeat in 5 yrs   ESOPHAGOGASTRODUODENOSCOPY  07/06/2021   per Dr. Willy Harvest, showed portal hypertensive gastropathy, repeat in 2 yrs   ESOPHAGOGASTRODUODENOSCOPY (EGD) WITH PROPOFOL  N/A 06/20/2017   Procedure: ESOPHAGOGASTRODUODENOSCOPY (EGD) WITH PROPOFOL ;  Surgeon: Kenney Peacemaker, MD;  Location: [facility] ENDOSCOPY;  Service: Endoscopy;  Laterality: N/A;  with Barrx   EYE SURGERY     NASAL SEPTOPLASTY W/ TURBINOPLASTY     POLYPECTOMY  2015   CG-TA   TONSILLECTOMY      Family History  Problem Relation Age of Onset   Stroke Mother    Hypertension Mother    Colon cancer Father 74   Liver cancer Father 64   Colon polyps Father 42   Heart disease Sister        hole in heart   Pancreatic cancer Maternal Aunt    Brain cancer Maternal Uncle    Stroke Maternal Grandmother    Hypertension Maternal Grandmother    Esophageal cancer Maternal Grandfather    Throat cancer Maternal Grandfather    Breast cancer Paternal Grandmother    Brain cancer Paternal Grandmother    Lung cancer Paternal Grandfather    Rectal cancer Neg Hx    Stomach cancer Neg Hx     Social History:  reports that she has never smoked. She has never used smokeless tobacco. She reports current  alcohol use. She reports that she does not use drugs.  Allergies:  Allergies  Allergen Reactions   Fentanyl Anxiety    Made her feel very anxious, requests not to get it.   Levofloxacin Anxiety    Weird dreams    Amoxicillin  Diarrhea    Has patient had a PCN reaction causing immediate rash, facial/tongue/throat swelling, SOB or lightheadedness with hypotension: No Has patient had a PCN reaction causing severe rash involving mucus membranes or skin necrosis: No Has patient had a PCN reaction that required hospitalization: No Has patient had a PCN reaction occurring within the last 10 years: Yes If all of the above answers are "NO", then may proceed with Cephalosporin use.    Augmentin  [Amoxicillin -Pot  Clavulanate] Other (See Comments)    diarrhea   Farxiga  [Dapagliflozin ] Diarrhea    Gi pain   Statins     Myalgia     Medications: I have reviewed the patient's current medications. Prior to Admission: (Not in a hospital admission)  Scheduled:  cyanocobalamin  500 mcg Oral Daily   ezetimibe   10 mg Oral Daily   insulin  aspart  0-9 Units Subcutaneous TID WC   lisinopril   10 mg Oral Daily   pantoprazole  20 mg Oral Daily   predniSONE  10 mg Oral Q breakfast   venlafaxine  XR  75 mg Oral Q breakfast   Continuous:  potassium chloride  10 mEq (10/27/23 0650)   PRN:HYDROmorphone  (DILAUDID ) injection, labetalol, traZODone  Anti-infectives (From admission, onward)    None        Results for orders placed or performed during the hospital encounter of 10/26/23 (from the past 48 hours)  Comprehensive metabolic panel     Status: Abnormal   Collection Time: 10/26/23  9:50 PM  Result Value Ref Range   Sodium 138 135 - 145 mmol/L   Potassium 2.6 (LL) 3.5 - 5.1 mmol/L    Comment: CRITICAL RESULT CALLED TO, READ BACK BY AND VERIFIED WITH J.JOLSTER RN 2336 10/26/2023 BY G.GANADEN   Chloride 102 98 - 111 mmol/L   CO2 25 22 - 32 mmol/L   Glucose, Bld 133 (H) 70 - 99 mg/dL    Comment: Glucose reference range applies only to samples taken after fasting for at least 8 hours.   BUN 6 (L) 8 - 23 mg/dL   Creatinine, Ser 1.61 0.44 - 1.00 mg/dL   Calcium  9.0 8.9 - 10.3 mg/dL   Total Protein 7.2 6.5 - 8.1 g/dL   Albumin 3.9 3.5 - 5.0 g/dL   AST 52 (H) 15 - 41 U/L   ALT 29 0 - 44 U/L   Alkaline Phosphatase 118 38 - 126 U/L   Total Bilirubin 1.0 0.0 - 1.2 mg/dL   GFR, Estimated >09 >60 mL/min    Comment: (NOTE) Calculated using the CKD-EPI Creatinine Equation (2021)    Anion gap 11 5 - 15    Comment: Performed at [facility], [address] [place], [place] [address]  CBC     Status: Abnormal   Collection Time: 10/26/23  9:50 PM  Result Value Ref Range   WBC 16.5 (H) 4.0 - 10.5 K/uL    RBC 4.37 3.87 - 5.11 MIL/uL   Hemoglobin 14.2 12.0 - 15.0 g/dL   HCT 81.1 91.4 - 78.2 %   MCV 96.3 80.0 - 100.0 fL   MCH 32.5 26.0 - 34.0 pg   MCHC 33.7 30.0 - 36.0 g/dL   RDW 95.6 21.3 - 08.6 %  Platelets 198 150 - 400 K/uL   nRBC 0.0 0.0 - 0.2 %    Comment: Performed at [facility], [address] [place], [place] [address]  Protime-INR     Status: None   Collection Time: 10/26/23  9:50 PM  Result Value Ref Range   Prothrombin Time 14.0 11.4 - 15.2 seconds   INR 1.1 0.8 - 1.2    Comment: (NOTE) INR goal varies based on device and disease states. Performed at [facility], [address], [place], [place] [address]   Ammonia     Status: None   Collection Time: 10/26/23  9:50 PM  Result Value Ref Range   Ammonia 33 9 - 35 umol/L    Comment: Performed at [facility], [address] [place], [place] [address]  CBG monitoring, ED     Status: Abnormal   Collection Time: 10/26/23  9:58 PM  Result Value Ref Range   Glucose-Capillary 148 (H) 70 - 99 mg/dL    Comment: Glucose reference range applies only to samples taken after fasting for at least 8 hours.  I-Stat Chem 8, ED     Status: Abnormal   Collection Time: 10/26/23 10:27 PM  Result Value Ref Range   Sodium 141 135 - 145 mmol/L   Potassium 2.6 (LL) 3.5 - 5.1 mmol/L   Chloride 102 98 - 111 mmol/L   BUN 8 8 - 23 mg/dL   Creatinine, Ser 0.93 0.44 - 1.00 mg/dL   Glucose, Bld 235 (H) 70 - 99 mg/dL    Comment: Glucose reference range applies only to samples taken after fasting for at least 8 hours.   Calcium , Ion 1.13 (L) 1.15 - 1.40 mmol/L   TCO2 24 22 - 32 mmol/L   Hemoglobin 14.3 12.0 - 15.0 g/dL   HCT 57.3 22.0 - 25.4 %   Comment NOTIFIED PHYSICIAN   I-Stat Lactic Acid, ED     Status: Abnormal   Collection Time: 10/26/23 10:28 PM  Result Value Ref Range   Lactic Acid, Venous 3.3 (HH) 0.5 - 1.9 mmol/L   Comment NOTIFIED PHYSICIAN   Hemoglobin A1c     Status: Abnormal   Collection Time: 10/27/23   5:04 AM  Result Value Ref Range   Hgb A1c MFr Bld 6.8 (H) 4.8 - 5.6 %    Comment: (NOTE) Pre diabetes:          5.7%-6.4%  Diabetes:              >6.4%  Glycemic control for   <7.0% adults with diabetes    Mean Plasma Glucose 148.46 mg/dL    Comment: Performed at [facility], [address], [place], [place] [address]  Basic metabolic panel     Status: Abnormal   Collection Time: 10/27/23  5:04 AM  Result Value Ref Range   Sodium 135 135 - 145 mmol/L   Potassium 2.9 (L) 3.5 - 5.1 mmol/L   Chloride 100 98 - 111 mmol/L   CO2 22 22 - 32 mmol/L   Glucose, Bld 291 (H) 70 - 99 mg/dL    Comment: Glucose reference range applies only to samples taken after fasting for at least 8 hours.   BUN 7 (L) 8 - 23 mg/dL   Creatinine, Ser 3.76 0.44 - 1.00 mg/dL   Calcium  7.8 (L) 8.9 - 10.3 mg/dL   GFR, Estimated >28 >31 mL/min    Comment: (NOTE) Calculated using the CKD-EPI Creatinine Equation (  2021)    Anion gap 13 5 - 15    Comment: Performed at [facility], [address], [place], [place] [address]  CBC     Status: Abnormal   Collection Time: 10/27/23  5:04 AM  Result Value Ref Range   WBC 14.7 (H) 4.0 - 10.5 K/uL   RBC 3.82 (L) 3.87 - 5.11 MIL/uL   Hemoglobin 12.3 12.0 - 15.0 g/dL   HCT 60.4 54.0 - 98.1 %   MCV 96.6 80.0 - 100.0 fL   MCH 32.2 26.0 - 34.0 pg   MCHC 33.3 30.0 - 36.0 g/dL   RDW 19.1 47.8 - 29.5 %   Platelets 177 150 - 400 K/uL   nRBC 0.0 0.0 - 0.2 %    Comment: Performed at [facility], [address] [place], [place] [address]  Magnesium     Status: Abnormal   Collection Time: 10/27/23  5:04 AM  Result Value Ref Range   Magnesium 1.6 (L) 1.7 - 2.4 mg/dL    Comment: Performed at [facility], [address], [place], [place] [address]  Urinalysis, Routine w reflex microscopic -Urine, Clean Catch     Status: Abnormal   Collection Time: 10/27/23  5:53 AM  Result Value Ref Range   Color, Urine YELLOW YELLOW   APPearance CLEAR CLEAR    Specific Gravity, Urine 1.022 1.005 - 1.030   pH 6.0 5.0 - 8.0   Glucose, UA >=500 (A) NEGATIVE mg/dL   Hgb urine dipstick NEGATIVE NEGATIVE   Bilirubin Urine NEGATIVE NEGATIVE   Ketones, ur 20 (A) NEGATIVE mg/dL   Protein, ur 469 (A) NEGATIVE mg/dL   Nitrite NEGATIVE NEGATIVE   Leukocytes,Ua NEGATIVE NEGATIVE   RBC / HPF 0-5 0 - 5 RBC/hpf   WBC, UA 0-5 0 - 5 WBC/hpf   Bacteria, UA NONE SEEN NONE SEEN   Squamous Epithelial / HPF 0-5 0 - 5 /HPF   Mucus PRESENT     Comment: Performed at [facility], [address] [place], [place] [address]  Sample to Blood Bank     Status: None   Collection Time: 10/27/23  6:47 AM  Result Value Ref Range   Blood Bank Specimen SAMPLE AVAILABLE FOR TESTING    Sample Expiration      10/30/2023,2359 Performed at [facility], [address], [place], [place] [address]   CBG monitoring, ED     Status: Abnormal   Collection Time: 10/27/23  6:59 AM  Result Value Ref Range   Glucose-Capillary 284 (H) 70 - 99 mg/dL    Comment: Glucose reference range applies only to samples taken after fasting for at least 8 hours.   Comment 1 Notify RN     CT HEAD WO CONTRAST Result Date: 10/26/2023 CLINICAL DATA:  Fall EXAM: CT HEAD WITHOUT CONTRAST CT CERVICAL SPINE WITHOUT CONTRAST TECHNIQUE: Multidetector CT imaging of the head and cervical spine was performed following the standard protocol without intravenous contrast. Multiplanar CT image reconstructions of the cervical spine were also generated. RADIATION DOSE REDUCTION: This exam was performed according to the departmental dose-optimization program which includes automated exposure control, adjustment of the mA and/or kV according to patient size and/or use of iterative reconstruction technique. COMPARISON:  None Available. FINDINGS: CT HEAD FINDINGS Brain: There is mixed subdural and subarachnoid hemorrhage of the right hemisphere, predominantly anterior. There is a small amount of subdural blood  layering along the falx cerebrum. No midline shift  or other mass effect. No hydrocephalus. Vascular: Atherosclerotic calcification of the vertebral and internal carotid arteries at the skull base. No abnormal hyperdensity of the major intracranial arteries or dural venous sinuses. Skull: Large left posterior scalp hematoma. Nondisplaced left occipital skull fracture. Sinuses/Orbits: No fluid levels or advanced mucosal thickening of the visualized paranasal sinuses. No mastoid or middle ear effusion. Normal orbits. Other: None. CT CERVICAL SPINE FINDINGS Alignment: Grade 1 anterolisthesis at C4-5. Skull base and vertebrae: No acute fracture. Soft tissues and spinal canal: No prevertebral fluid or swelling. No visible canal hematoma. Disc levels: No advanced spinal canal or neural foraminal stenosis. Upper chest: No pneumothorax, pulmonary nodule or pleural effusion. Other: Normal visualized paraspinal cervical soft tissues. IMPRESSION: 1. Mixed subdural and subarachnoid hemorrhage of the right hemisphere, predominantly anterior. No midline shift or other mass effect. 2. Nondisplaced left occipital skull fracture with large left posterior scalp hematoma. 3. No acute fracture or static subluxation of the cervical spine. Critical Value/emergent results were called by telephone at the time of interpretation on 10/26/2023 at 11:50 pm to provider JONATHAN GILLIAM , who verbally acknowledged these results. Electronically Signed   By: Juanetta Nordmann M.D.   On: 10/26/2023 23:51   CT CERVICAL SPINE WO CONTRAST Result Date: 10/26/2023 CLINICAL DATA:  Fall EXAM: CT HEAD WITHOUT CONTRAST CT CERVICAL SPINE WITHOUT CONTRAST TECHNIQUE: Multidetector CT imaging of the head and cervical spine was performed following the standard protocol without intravenous contrast. Multiplanar CT image reconstructions of the cervical spine were also generated. RADIATION DOSE REDUCTION: This exam was performed according to the departmental  dose-optimization program which includes automated exposure control, adjustment of the mA and/or kV according to patient size and/or use of iterative reconstruction technique. COMPARISON:  None Available. FINDINGS: CT HEAD FINDINGS Brain: There is mixed subdural and subarachnoid hemorrhage of the right hemisphere, predominantly anterior. There is a small amount of subdural blood layering along the falx cerebrum. No midline shift or other mass effect. No hydrocephalus. Vascular: Atherosclerotic calcification of the vertebral and internal carotid arteries at the skull base. No abnormal hyperdensity of the major intracranial arteries or dural venous sinuses. Skull: Large left posterior scalp hematoma. Nondisplaced left occipital skull fracture. Sinuses/Orbits: No fluid levels or advanced mucosal thickening of the visualized paranasal sinuses. No mastoid or middle ear effusion. Normal orbits. Other: None. CT CERVICAL SPINE FINDINGS Alignment: Grade 1 anterolisthesis at C4-5. Skull base and vertebrae: No acute fracture. Soft tissues and spinal canal: No prevertebral fluid or swelling. No visible canal hematoma. Disc levels: No advanced spinal canal or neural foraminal stenosis. Upper chest: No pneumothorax, pulmonary nodule or pleural effusion. Other: Normal visualized paraspinal cervical soft tissues. IMPRESSION: 1. Mixed subdural and subarachnoid hemorrhage of the right hemisphere, predominantly anterior. No midline shift or other mass effect. 2. Nondisplaced left occipital skull fracture with large left posterior scalp hematoma. 3. No acute fracture or static subluxation of the cervical spine. Critical Value/emergent results were called by telephone at the time of interpretation on 10/26/2023 at 11:50 pm to provider JONATHAN GILLIAM , who verbally acknowledged these results. Electronically Signed   By: Juanetta Nordmann M.D.   On: 10/26/2023 23:51   CT CHEST ABDOMEN PELVIS W CONTRAST Result Date: 10/26/2023 CLINICAL  DATA:  Blunt trauma, loss of balance, fell backwards and hit head, hypertension EXAM: CT CHEST, ABDOMEN, AND PELVIS WITH CONTRAST TECHNIQUE: Multidetector CT imaging of the chest, abdomen and pelvis was performed following the standard protocol during bolus administration of intravenous contrast. RADIATION DOSE REDUCTION:  This exam was performed according to the departmental dose-optimization program which includes automated exposure control, adjustment of the mA and/or kV according to patient size and/or use of iterative reconstruction technique. CONTRAST:  75mL OMNIPAQUE IOHEXOL 350 MG/ML SOLN COMPARISON:  06/16/2016, 06/05/2023 FINDINGS: CT CHEST FINDINGS Cardiovascular: The heart is enlarged without pericardial effusion. No evidence of thoracic aortic aneurysm or dissection. No evidence of vascular injury. Atherosclerosis of the aorta and coronary vasculature. Mediastinum/Nodes: No enlarged mediastinal, hilar, or axillary lymph nodes. Thyroid  gland, trachea, and esophagus demonstrate no significant findings. Lungs/Pleura: No acute airspace disease, effusion, or pneumothorax. Central airways are patent. Musculoskeletal: There is an acute appearing fracture through the superior endplate of the T10 vertebral body, with less than 10% loss of height. No retropulsion. No other acute bony abnormalities. Chronic appearing compression deformity within the inferior endplate of the T9 vertebral body. Reconstructed images demonstrate no additional findings. CT ABDOMEN PELVIS FINDINGS Hepatobiliary: Nodular contour of the liver consistent with cirrhosis. No focal parenchymal liver abnormality. No evidence of hepatic injury. The gallbladder is unremarkable. No biliary duct dilation. Pancreas: Unremarkable. No pancreatic ductal dilatation or surrounding inflammatory changes. Spleen: The spleen is enlarged, measuring 11.8 x 11.7 x 6.0 cm. No focal parenchymal abnormality. Adrenals/Urinary Tract: No adrenal hemorrhage or renal  injury identified. Bladder is unremarkable. Stomach/Bowel: No bowel obstruction or ileus. No bowel wall thickening or inflammatory change. Vascular/Lymphatic: Aortic atherosclerosis. No enlarged abdominal or pelvic lymph nodes. Reproductive: Multiple calcified uterine fibroids. No adnexal masses. Other: No free fluid or free intraperitoneal gas. Small fat containing umbilical hernia. Musculoskeletal: Chronic appearing compression deformities are seen at T12, L2, L3, L4, and L5, with no significant change since the prior exam. Reconstructed images demonstrate no additional findings. IMPRESSION: 1. Acute appearing compression deformity through the superior endplate of the T10 vertebral body, with less than 10% loss of vertebral body height. No retropulsion. 2. Otherwise no acute intrathoracic, intra-abdominal, or intrapelvic trauma. 3. 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 4. Other chronic thoracolumbar compression deformities as above. 5.  Aortic Atherosclerosis (ICD10-I70.0). 6. Cardiomegaly without pericardial effusion. Electronically Signed   By: Bobbye Burrow M.D.   On: 10/26/2023 23:38   DG Chest Port 1 View Result Date: 10/26/2023 CLINICAL DATA:  Fall, chest are EXAM: PORTABLE CHEST 1 VIEW COMPARISON:  None Available. FINDINGS: Lungs are well expanded, symmetric, and clear. No pneumothorax or pleural effusion. Cardiac size within normal limits. Pulmonary vascularity is normal. Osseous structures are age-appropriate. No acute bone abnormality. IMPRESSION: 1. No active disease. Electronically Signed   By: Worthy Heads M.D.   On: 10/26/2023 22:26    ROS: As above Blood pressure (!) 182/91, pulse 89, temperature 98.6 F (37 C), temperature source Oral, resp. rate (!) 22, height 5\' 4"  (1.626 m), weight 55 kg, SpO2 95%. Estimated body mass index is 20.81 kg/m as calculated from the following:   Height as of this encounter: 5\' 4"  (1.626 m).   Weight as of this encounter:  55 kg.  Physical Exam  General A somnolent but easily arousable 73 year old white female with a gauze head wrap.  HEENT: Gauze head wrap, her pupils are equal, extraocular muscles are intact  Neck: Unremarkable age-appropriate decreased range of motion  Thorax: Symmetric  Abdomen: Soft  Extremities: Unremarkable  Neurologic exam: The patient is Glasgow Coma Scale 14, [id number].  She is somnolent but easily arousable.  She is oriented to person and month.  Cranial nerve exam is somewhat limited because of her somnolence but appears  fairly normal.  Her strength is grossly normal in all 4 extremities but she is somewhat slow to follow commands on the left.  Cerebellar function is intact to rapid alternating movements of the upper extremities bilaterally.  Sensory function is intact to light touch sensation all tested dermatomes bilaterally.  Imaging studies: The patient has a left occipital skull fracture, traumatic subarachnoid hemorrhage.  She has a fall left cerebellar contusion and small right temporal and frontal contusions.  There is a small right frontal subdural hematoma again without significant mass effect.  I reviewed the patient's cervical CT.  It demonstrates degenerative changes.   Assessment/Plan: Left cerebellar, right frontal and temporal contusions, right subdural hematoma, traumatic subarachnoid hemorrhage, skull fracture: The plan is to repeat CAT scan later today and likely observe the patient for a few days.  I will be going out of town tomorrow and I have asked Dr. Ellery Guthrie to follow-up with the patient as he has seen her before for a compression fracture.  I have spoken with her husband.  Elder Greening 10/27/2023, 7:54 AM

## 2023-10-27 NOTE — Procedures (Signed)
 Intubation Procedure Note  Miranda Cochran  [id number]  07-04-1950  Date:10/27/23  Time:11:40 AM   Provider Performing:Jennae Hakeem    Procedure: Intubation (31500)  Indication(s) Respiratory Failure  Consent Risks of the procedure as well as the alternatives and risks of each were explained to the patient and/or caregiver.  Consent for the procedure was obtained and is signed in the bedside chart   Anesthesia Etomidate and Rocuronium   Time Out Verified patient identification, verified procedure, site/side was marked, verified correct patient position, special equipment/implants available, medications/allergies/relevant history reviewed, required imaging and test results available.   Sterile Technique Usual hand hygeine, masks, and gloves were used   Procedure Description Patient positioned in bed supine.  Sedation given as noted above.  Patient was intubated with endotracheal tube using Glidescope.  View was Grade 1 full glottis .  Number of attempts was 1.  Colorimetric CO2 detector was consistent with tracheal placement.   Complications/Tolerance None; patient tolerated the procedure well. Chest X-ray is ordered to verify placement.   EBL Minimal   Specimen(s) None  Siegfried Dress Chanteria Haggard ACNP Acute Care Nurse Practitioner [facility] Pulmonary/Critical Care Please consult Amion 10/27/2023, 11:41 AM

## 2023-10-27 NOTE — Consult Note (Signed)
 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  Has history of recurrent falls at home for unclear reason per husband at bedside.  Was being managed expectantly with NSGY following when had abrupt reduced LOS and tachyarrythmia.  On my arrival in SVT with pursed lip breathing.  Stat repeat head CT does show enlargement of multiple brain contusions.  NSGY aware and will see patient later today.  PCCM consulted to manage.  Trauma scan neg.  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   GERD (gastroesophageal reflux disease)    on meds   Hiatal hernia    Hx of adenomatous polyp of colon 07/07/2009   Hyperlipidemia    diet controlled   Hypertension    on meds - LASIX    IBS (irritable bowel syndrome)    Iron  deficiency anemia    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 rate (!) 22, height 5\' 4"  (1.626 m), weight 55 kg, SpO2 95%.       No intake or output data in the 24 hours ending 10/27/23 1054 Filed Weights   10/26/23 2200  Weight: 55 kg    Examination: Ill appearing Moving L>R Pupils equal, not tracking Heart tachy, some sort of SVT on monitor Intermittent gasping with pursed lip breathing Ext warm Multiple bruises noted  Labs/imaging personally reviewed  Resolved problem list   Assessment and Plan  Acute encephalopathy, inability to protect airway- in context of worsening TBI, r/o seizure, no herniation on CT. 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 SVT, hypo-mg, hypo-K- replete electrolytes as ordered  - Vent bundle, prop/fent - TXA x1, mannitol x 1 - Stat EEG, LTVEEG PRN, neuro to see; rec'd for t+6h CT head - NSGY to re-eval once out of OR - Load with keppra - Check DIC panel - Avoid acidemia, hypocalemia, coagulopathy - Can do hydrocortisone 100mg  daily for now and dialy - SSI - See how things settle out with SVT after electrolyte repletion, sedation; if persistent can check trops and do amio but really nothing we can do should this be an  ischemic equivalent - Husband updated at bedside, remains full code  Best Practice (right click and "Reselect all SmartList Selections" daily)   Diet/type: NPO DVT prophylaxis SCD Pressure ulcer(s): N/A GI prophylaxis: PPI Lines: Central line Foley:  Yes, and it is still needed Code Status:  full code Last date of multidisciplinary goals of care discussion [updated husband]  Labs   CBC: Recent Labs  Lab 10/26/23 2150 10/26/23 2227 10/27/23 0504  WBC 16.5*  --  14.7*  HGB 14.2 14.3 12.3  HCT 42.1 42.0 36.9  MCV 96.3  --  96.6  PLT 198  --  177    Basic Metabolic Panel: Recent Labs  Lab 10/26/23 2150 10/26/23 2227  10/27/23 0504  NA 138 141 135  K 2.6* 2.6* 2.9*  CL 102 102 100  CO2 25  --  22  GLUCOSE 133* 133* 291*  BUN 6* 8 7*  CREATININE 0.62 0.50 0.52  CALCIUM  9.0  --  7.8*  MG  --   --  1.6*   GFR: Estimated Creatinine Clearance: 55.7 mL/min (by C-G formula based on SCr of 0.52 mg/dL). Recent Labs  Lab 10/26/23 2150 10/26/23 2228 10/27/23 0504  WBC 16.5*  --  14.7*  LATICACIDVEN  --  3.3*  --     Liver Function Tests: Recent Labs  Lab 10/26/23 2150  AST 52*  ALT 29  ALKPHOS 118  BILITOT 1.0  PROT 7.2  ALBUMIN 3.9   No results for input(s): "LIPASE", "AMYLASE" in the last 168 hours. Recent Labs  Lab 10/26/23 2150  AMMONIA 33    ABG    Component Value Date/Time   TCO2 24 10/26/2023 2227     Coagulation Profile: Recent Labs  Lab 10/26/23 2150  INR 1.1    Cardiac Enzymes: No results for input(s): "CKTOTAL", "CKMB", "CKMBINDEX", "TROPONINI" in the last 168 hours.  HbA1C: Hgb A1c MFr Bld  Date/Time Value Ref Range Status  10/27/2023 05:04 AM 6.8 (H) 4.8 - 5.6 % Final    Comment:    (NOTE) Pre diabetes:          5.7%-6.4%  Diabetes:              >6.4%  Glycemic control for   <7.0% adults with diabetes   02/25/2022 11:35 AM 7.6 (H) 4.6 - 6.5 % Final    Comment:    Glycemic Control Guidelines for People with Diabetes:Non Diabetic:  <6%Goal of Therapy: <7%Additional Action Suggested:  >8%     CBG: Recent Labs  Lab 10/26/23 2158 10/27/23 0659  GLUCAP 148* 284*    Review of Systems:   Comatose  Past Medical History:  She,  has a past medical history of Abnormal Pap smear of cervix, Allergy, Anxiety, Autoimmune hepatitis (HCC) (08/18/2016), 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 (08/29/2020), COVID (06/27/2020), Depression, DM (diabetes mellitus) (HCC), Fracture, fibula, GAVE (gastric antral vascular ectasia) (02/15/2017), GERD (gastroesophageal reflux disease), Hiatal hernia, adenomatous polyp of colon  (07/07/2009), Hyperlipidemia, Hypertension, IBS (irritable bowel syndrome), Iron  deficiency anemia, 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   Surgical History:   Past Surgical History:  Procedure Laterality Date   BREAST EXCISIONAL BIOPSY Left    BREAST EXCISIONAL BIOPSY Right    CATARACT EXTRACTION Bilateral    COLONOSCOPY  07/18/2020   per Dr. Willy Harvest, adenimatous polyps, repeat in 5 yrs  ESOPHAGOGASTRODUODENOSCOPY  07/06/2021   per Dr. Willy Harvest, showed portal hypertensive gastropathy, repeat in 2 yrs   ESOPHAGOGASTRODUODENOSCOPY (EGD) WITH PROPOFOL  N/A 06/20/2017   Procedure: ESOPHAGOGASTRODUODENOSCOPY (EGD) WITH PROPOFOL ;  Surgeon: Kenney Peacemaker, MD;  Location: [facility];  Service: Endoscopy;  Laterality: N/A;  with Barrx   EYE SURGERY     NASAL SEPTOPLASTY W/ TURBINOPLASTY     POLYPECTOMY  2015   CG-TA   TONSILLECTOMY       Social History:   reports that she has never smoked. She has never used smokeless tobacco. She reports current alcohol use. She reports that she does not use drugs.   Family History:  Her family history includes Brain cancer in her maternal uncle and paternal grandmother; Breast cancer in her paternal grandmother; Colon cancer (age of onset: 42) in her father; Colon polyps (age of onset: 62) in her father; Esophageal cancer in her maternal grandfather; Heart disease in her sister; Hypertension in her maternal grandmother and mother; Liver cancer (age of onset: 28) in her father; Lung cancer in her paternal grandfather; Pancreatic cancer in her maternal aunt; Stroke in her maternal grandmother and mother; Throat cancer in her maternal grandfather. There is no history of Rectal cancer or Stomach cancer.   Allergies Allergies  Allergen Reactions   Fentanyl Anxiety    Made her feel very anxious,  requests not to get it.   Levofloxacin Anxiety    Weird dreams    Amoxicillin  Diarrhea    Has patient had a PCN reaction causing immediate rash, facial/tongue/throat swelling, SOB or lightheadedness with hypotension: No Has patient had a PCN reaction causing severe rash involving mucus membranes or skin necrosis: No Has patient had a PCN reaction that required hospitalization: No Has patient had a PCN reaction occurring within the last 10 years: Yes If all of the above answers are "NO", then may proceed with Cephalosporin use.    Augmentin  [Amoxicillin -Pot Clavulanate] Other (See Comments)    diarrhea   Farxiga  [Dapagliflozin ] Diarrhea    Gi pain   Statins     Myalgia      Home Medications  Prior to Admission medications   Medication Sig Start Date End Date Taking? Authorizing Provider  ALPRAZolam  (XANAX ) 0.5 MG tablet TAKE 1 TABLET(0.5 MG) BY MOUTH AT BEDTIME AS NEEDED FOR SLEEP 04/22/23  Yes Donley Furth, MD  AMBULATORY NON FORMULARY MEDICATION ACTIVE LIVER Artichoke leaf extract, Milk thistle, Turmeric Take 1 capsule by mouth once daily   Yes [provider]  aspirin  325 MG tablet Take 650 mg by mouth daily as needed for moderate pain (pain score 4-6) or headache.   Yes [provider]  cetirizine  (ZYRTEC ) 10 MG tablet Take 10 mg by mouth daily.   Yes [provider]  Cholecalciferol 25 MCG (1000 UT) CHEW Chew 1,000 Units by mouth daily.   Yes [provider]  Cyanocobalamin 500 MCG CHEW Chew 500 mcg by mouth daily.   Yes [provider]  diphenhydrAMINE  (BENADRYL ) 25 mg capsule Take 25-50 mg by mouth daily as needed for allergies.    Yes [provider]  ezetimibe  (ZETIA ) 10 MG tablet TAKE 1 TABLET(10 MG) BY MOUTH DAILY 05/20/22  Yes Lajean Pike, MD  furosemide  (LASIX ) 40 MG tablet TAKE 1 TABLET(40 MG) BY MOUTH DAILY 04/22/23  Yes Donley Furth, MD  ibuprofen (ADVIL) 200 MG tablet Take 400 mg by mouth as needed for mild pain  (pain score 1-3) or moderate pain (pain score 4-6).  Yes [provider]  insulin  lispro (HUMALOG ) 100 UNIT/ML KwikPen 18-22 Units daily with lunch. 09/21/22 03/13/24 Yes [provider]  lansoprazole  (PREVACID ) 30 MG capsule TAKE 1 CAPSULE(30 MG) BY MOUTH DAILY 04/22/23  Yes Donley Furth, MD  lisinopril  (ZESTRIL ) 10 MG tablet Take 1 tablet (10 mg total) by mouth daily. 09/07/23 09/06/24 Yes Donley Furth, MD  methocarbamol (ROBAXIN) 500 MG tablet Take 500 mg by mouth every 6 (six) hours as needed. 10/20/23  Yes [provider]  Multiple Vitamin (MULTIVITAMIN PO) Take by mouth.   Yes [provider]  naloxone (NARCAN) nasal spray 4 mg/0.1 mL Spray the contents of 1 device into 1 nostril. Call 911. May repeat with 2nd device in alternate nostril if no response in 2-3 minutes. 06/12/23  Yes [provider]  predniSONE (DELTASONE) 10 MG tablet Take 10 mg by mouth daily with breakfast.   Yes [provider]  RYBELSUS 14 MG TABS Take 1 tablet by mouth every morning.   Yes [provider]  traZODone  (DESYREL ) 50 MG tablet TAKE 1 TABLET BY MOUTH EVERYDAY AT BEDTIME Patient taking differently: Take 50 mg by mouth at bedtime as needed for sleep. 10/13/23  Yes Donley Furth, MD  tretinoin (RETIN-A) 0.025 % cream Apply topically at bedtime. 11/29/22  Yes [provider]  venlafaxine  XR (EFFEXOR -XR) 75 MG 24 hr capsule TAKE 1 CAPSULE BY MOUTH TWICE DAILY WITH A MEAL Patient taking differently: Take 75 mg by mouth. TAKE 1 CAPSULE BY MOUTH DAILY WITH A MEAL 04/22/23  Yes Donley Furth, MD     Critical care time: 34 mins independent of procedures

## 2023-10-27 NOTE — H&P (Signed)
 History and Physical    Miranda Cochran:[id number] DOB: 25-Dec-1950 DOA: 10/26/2023  Patient coming from: Home.  Chief Complaint: Fall.  HPI: Miranda Cochran is a 73 y.o. female with history of autoimmune hepatitis with cirrhosis of liver, hypertension, depression, GERD, hyperlipidemia was brought to the ER patient had a fall and hit her head.  Patient states she went to get groceries.  On arriving at home patient was feeling dizzy.  When she sat on the chair as she fell backwards and hit her head.  She had to crawl to her neighbor's house and EMS was called and patient was brought to the ER.  Denies losing consciousness.  Denies any palpitation chest pain shortness of breath.  Patient has been having chronic back pains and also prior compression fractures of the vertebra.  ED Course: In the ER CT head shows subdural and subarachnoid hemorrhage and also occipital skull fracture with large scalp hematoma.  On-call neurosurgeon Dr. Larrie Po has been consulted.  Labs show hypokalemia with EKG showing prolonged QTc.  WBC 16.5.  CT chest abdomen pelvis shows features concerning for cirrhosis of liver.  Patient was given potassium replacement admitted for further management.  Blood pressure has been more than 200 systolic for which patient received both hydralazine and labetalol.  Review of Systems: As per HPI, rest all negative.   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   GERD (gastroesophageal reflux disease)    on meds   Hiatal hernia    Hx of adenomatous polyp of colon  07/07/2009   Hyperlipidemia    diet controlled   Hypertension    on meds - LASIX    IBS (irritable bowel syndrome)    Iron  deficiency anemia    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 Willy Harvest, adenimatous polyps, repeat in 5 yrs   ESOPHAGOGASTRODUODENOSCOPY  07/06/2021   per Dr. Willy Harvest, showed portal hypertensive gastropathy, repeat in 2 yrs   ESOPHAGOGASTRODUODENOSCOPY (EGD) WITH PROPOFOL  N/A 06/20/2017   Procedure: ESOPHAGOGASTRODUODENOSCOPY (EGD) WITH PROPOFOL ;  Surgeon: Kenney Peacemaker, MD;  Location: [facility];  Service: Endoscopy;  Laterality: N/A;  with Barrx   EYE SURGERY     NASAL SEPTOPLASTY W/ TURBINOPLASTY     POLYPECTOMY  2015   CG-TA   TONSILLECTOMY       reports that she has never smoked. She has never used smokeless tobacco. She reports current alcohol use. She reports that she does not use drugs.  Allergies  Allergen Reactions   Fentanyl Anxiety    Made her feel  very anxious, requests not to get it.   Levofloxacin Anxiety    Weird dreams    Amoxicillin  Diarrhea    Has patient had a PCN reaction causing immediate rash, facial/tongue/throat swelling, SOB or lightheadedness with hypotension: No Has patient had a PCN reaction causing severe rash involving mucus membranes or skin necrosis: No Has patient had a PCN reaction that required hospitalization: No Has patient had a PCN reaction occurring within the last 10 years: Yes If all of the above answers are "NO", then may proceed with Cephalosporin use.    Augmentin  [Amoxicillin -Pot Clavulanate] Other (See Comments)    diarrhea   Farxiga   [Dapagliflozin ] Diarrhea    Gi pain   Statins     Myalgia     Family History  Problem Relation Age of Onset   Stroke Mother    Hypertension Mother    Colon cancer Father 4   Liver cancer Father 22   Colon polyps Father 39   Heart disease Sister        hole in heart   Pancreatic cancer Maternal Aunt    Brain cancer Maternal Uncle    Stroke Maternal Grandmother    Hypertension Maternal Grandmother    Esophageal cancer Maternal Grandfather    Throat cancer Maternal Grandfather    Breast cancer Paternal Grandmother    Brain cancer Paternal Grandmother    Lung cancer Paternal Grandfather    Rectal cancer Neg Hx    Stomach cancer Neg Hx     Prior to Admission medications   Medication Sig Start Date End Date Taking? Authorizing Provider  ALPRAZolam  (XANAX ) 0.5 MG tablet TAKE 1 TABLET(0.5 MG) BY MOUTH AT BEDTIME AS NEEDED FOR SLEEP 04/22/23  Yes Donley Furth, MD  AMBULATORY NON FORMULARY MEDICATION ACTIVE LIVER Artichoke leaf extract, Milk thistle, Turmeric Take 1 capsule by mouth once daily   Yes [provider]  aspirin  325 MG tablet Take 650 mg by mouth daily as needed for moderate pain (pain score 4-6) or headache.   Yes [provider]  cetirizine  (ZYRTEC ) 10 MG tablet Take 10 mg by mouth daily.   Yes [provider]  Cholecalciferol 25 MCG (1000 UT) CHEW Chew 1,000 Units by mouth daily.   Yes [provider]  Cyanocobalamin 500 MCG CHEW Chew 500 mcg by mouth daily.   Yes [provider]  diphenhydrAMINE  (BENADRYL ) 25 mg capsule Take 25-50 mg by mouth daily as needed for allergies.    Yes [provider]  ezetimibe  (ZETIA ) 10 MG tablet TAKE 1 TABLET(10 MG) BY MOUTH DAILY 05/20/22  Yes Lajean Pike, MD  furosemide  (LASIX ) 40 MG tablet TAKE 1 TABLET(40 MG) BY MOUTH DAILY 04/22/23  Yes Donley Furth, MD  ibuprofen (ADVIL) 200 MG tablet Take 400 mg by mouth as needed for mild pain (pain score 1-3) or moderate pain (pain score  4-6).   Yes [provider]  insulin  lispro (HUMALOG ) 100 UNIT/ML KwikPen 18-22 Units daily with lunch. 09/21/22 03/13/24 Yes [provider]  lansoprazole  (PREVACID ) 30 MG capsule TAKE 1 CAPSULE(30 MG) BY MOUTH DAILY 04/22/23  Yes Donley Furth, MD  lisinopril  (ZESTRIL ) 10 MG tablet Take 1 tablet (10 mg total) by mouth daily. 09/07/23 09/06/24 Yes Donley Furth, MD  methocarbamol (ROBAXIN) 500 MG tablet Take 500 mg by mouth every 6 (six) hours as needed. 10/20/23  Yes [provider]  Multiple Vitamin (MULTIVITAMIN PO) Take by mouth.   Yes [provider]  naloxone Carrolyn Clan)  nasal spray 4 mg/0.1 mL Spray the contents of 1 device into 1 nostril. Call 911. May repeat with 2nd device in alternate nostril if no response in 2-3 minutes. 06/12/23  Yes [provider]  predniSONE (DELTASONE) 10 MG tablet Take 10 mg by mouth daily with breakfast.   Yes [provider]  RYBELSUS 14 MG TABS Take 1 tablet by mouth every morning.   Yes [provider]  traZODone  (DESYREL ) 50 MG tablet TAKE 1 TABLET BY MOUTH EVERYDAY AT BEDTIME Patient taking differently: Take 50 mg by mouth at bedtime as needed for sleep. 10/13/23  Yes Donley Furth, MD  tretinoin (RETIN-A) 0.025 % cream Apply topically at bedtime. 11/29/22  Yes [provider]  venlafaxine  XR (EFFEXOR -XR) 75 MG 24 hr capsule TAKE 1 CAPSULE BY MOUTH TWICE DAILY WITH A MEAL Patient taking differently: Take 75 mg by mouth. TAKE 1 CAPSULE BY MOUTH DAILY WITH A MEAL 04/22/23  Yes Donley Furth, MD    Physical Exam: Constitutional: Moderately built and nourished. Vitals:   10/27/23 0200 10/27/23 0217 10/27/23 0230 10/27/23 0245  BP: (!) 199/108 (!) 182/94 (!) 193/88 (!) 206/99  Pulse:  71 65 75  Resp: (!) 22 18 (!) 23 (!) 22  Temp:  98.7 F (37.1 C)    TempSrc:  Oral    SpO2:  98% 95% 99%  Weight:      Height:       Eyes: Anicteric no pallor. ENMT: Large scalp hematoma for which dressing  was done. Neck: No mass felt.  No neck rigidity. Respiratory: No rhonchi or crepitations. Cardiovascular: S1-S2 heard. Abdomen: Soft nontender bowel sound present. Musculoskeletal: No edema. Skin: No rash. Neurologic: Alert awake oriented time place and person.  Moves all extremities. Psychiatric: Appears normal.  Normal affect.   Labs on Admission: I have personally reviewed following labs and imaging studies  CBC: Recent Labs  Lab 10/26/23 2150 10/26/23 2227  WBC 16.5*  --   HGB 14.2 14.3  HCT 42.1 42.0  MCV 96.3  --   PLT 198  --    Basic Metabolic Panel: Recent Labs  Lab 10/26/23 2150 10/26/23 2227  NA 138 141  K 2.6* 2.6*  CL 102 102  CO2 25  --   GLUCOSE 133* 133*  BUN 6* 8  CREATININE 0.62 0.50  CALCIUM  9.0  --    GFR: Estimated Creatinine Clearance: 55.7 mL/min (by C-G formula based on SCr of 0.5 mg/dL). Liver Function Tests: Recent Labs  Lab 10/26/23 2150  AST 52*  ALT 29  ALKPHOS 118  BILITOT 1.0  PROT 7.2  ALBUMIN 3.9   No results for input(s): "LIPASE", "AMYLASE" in the last 168 hours. Recent Labs  Lab 10/26/23 2150  AMMONIA 33   Coagulation Profile: Recent Labs  Lab 10/26/23 2150  INR 1.1   Cardiac Enzymes: No results for input(s): "CKTOTAL", "CKMB", "CKMBINDEX", "TROPONINI" in the last 168 hours. BNP (last 3 results) No results for input(s): "PROBNP" in the last 8760 hours. HbA1C: No results for input(s): "HGBA1C" in the last 72 hours. CBG: Recent Labs  Lab 10/26/23 2158  GLUCAP 148*   Lipid Profile: No results for input(s): "CHOL", "HDL", "LDLCALC", "TRIG", "CHOLHDL", "LDLDIRECT" in the last 72 hours. Thyroid  Function Tests: No results for input(s): "TSH", "T4TOTAL", "FREET4", "T3FREE", "THYROIDAB" in the last 72 hours. Anemia Panel: No results for input(s): "VITAMINB12", "FOLATE", "FERRITIN", "TIBC", "IRON ", "RETICCTPCT" in the last 72 hours. Urine analysis:    Component Value Date/Time  COLORURINE DARK YELLOW  07/02/2021 1031   APPEARANCEUR SLIGHTLY CLOUDY (A) 07/02/2021 1031   LABSPEC 1.010 07/02/2021 1031   PHURINE 5.5 07/02/2021 1031   GLUCOSEU NEGATIVE 07/02/2021 1031   GLUCOSEU NEGATIVE 08/01/2019 0907   HGBUR NEGATIVE 07/02/2021 1031   BILIRUBINUR NEGATIVE 08/01/2019 0907   BILIRUBINUR neg 12/28/2013 1209   KETONESUR TRACE (A) 07/02/2021 1031   PROTEINUR 1+ (A) 07/02/2021 1031   UROBILINOGEN 1.0 08/01/2019 0907   NITRITE NEGATIVE 08/01/2019 0907   LEUKOCYTESUR NEGATIVE 08/01/2019 0907   Sepsis Labs: @LABRCNTIP (procalcitonin:4,lacticidven:4) )No results found for this or any previous visit (from the past 240 hours).   Radiological Exams on Admission: CT HEAD WO CONTRAST Result Date: 10/26/2023 CLINICAL DATA:  Fall EXAM: CT HEAD WITHOUT CONTRAST CT CERVICAL SPINE WITHOUT CONTRAST TECHNIQUE: Multidetector CT imaging of the head and cervical spine was performed following the standard protocol without intravenous contrast. Multiplanar CT image reconstructions of the cervical spine were also generated. RADIATION DOSE REDUCTION: This exam was performed according to the departmental dose-optimization program which includes automated exposure control, adjustment of the mA and/or kV according to patient size and/or use of iterative reconstruction technique. COMPARISON:  None Available. FINDINGS: CT HEAD FINDINGS Brain: There is mixed subdural and subarachnoid hemorrhage of the right hemisphere, predominantly anterior. There is a small amount of subdural blood layering along the falx cerebrum. No midline shift or other mass effect. No hydrocephalus. Vascular: Atherosclerotic calcification of the vertebral and internal carotid arteries at the skull base. No abnormal hyperdensity of the major intracranial arteries or dural venous sinuses. Skull: Large left posterior scalp hematoma. Nondisplaced left occipital skull fracture. Sinuses/Orbits: No fluid levels or advanced mucosal thickening of the visualized  paranasal sinuses. No mastoid or middle ear effusion. Normal orbits. Other: None. CT CERVICAL SPINE FINDINGS Alignment: Grade 1 anterolisthesis at C4-5. Skull base and vertebrae: No acute fracture. Soft tissues and spinal canal: No prevertebral fluid or swelling. No visible canal hematoma. Disc levels: No advanced spinal canal or neural foraminal stenosis. Upper chest: No pneumothorax, pulmonary nodule or pleural effusion. Other: Normal visualized paraspinal cervical soft tissues. IMPRESSION: 1. Mixed subdural and subarachnoid hemorrhage of the right hemisphere, predominantly anterior. No midline shift or other mass effect. 2. Nondisplaced left occipital skull fracture with large left posterior scalp hematoma. 3. No acute fracture or static subluxation of the cervical spine. Critical Value/emergent results were called by telephone at the time of interpretation on 10/26/2023 at 11:50 pm to provider JONATHAN GILLIAM , who verbally acknowledged these results. Electronically Signed   By: Juanetta Nordmann M.D.   On: 10/26/2023 23:51   CT CERVICAL SPINE WO CONTRAST Result Date: 10/26/2023 CLINICAL DATA:  Fall EXAM: CT HEAD WITHOUT CONTRAST CT CERVICAL SPINE WITHOUT CONTRAST TECHNIQUE: Multidetector CT imaging of the head and cervical spine was performed following the standard protocol without intravenous contrast. Multiplanar CT image reconstructions of the cervical spine were also generated. RADIATION DOSE REDUCTION: This exam was performed according to the departmental dose-optimization program which includes automated exposure control, adjustment of the mA and/or kV according to patient size and/or use of iterative reconstruction technique. COMPARISON:  None Available. FINDINGS: CT HEAD FINDINGS Brain: There is mixed subdural and subarachnoid hemorrhage of the right hemisphere, predominantly anterior. There is a small amount of subdural blood layering along the falx cerebrum. No midline shift or other mass effect. No  hydrocephalus. Vascular: Atherosclerotic calcification of the vertebral and internal carotid arteries at the skull base. No abnormal hyperdensity of the major intracranial arteries or  dural venous sinuses. Skull: Large left posterior scalp hematoma. Nondisplaced left occipital skull fracture. Sinuses/Orbits: No fluid levels or advanced mucosal thickening of the visualized paranasal sinuses. No mastoid or middle ear effusion. Normal orbits. Other: None. CT CERVICAL SPINE FINDINGS Alignment: Grade 1 anterolisthesis at C4-5. Skull base and vertebrae: No acute fracture. Soft tissues and spinal canal: No prevertebral fluid or swelling. No visible canal hematoma. Disc levels: No advanced spinal canal or neural foraminal stenosis. Upper chest: No pneumothorax, pulmonary nodule or pleural effusion. Other: Normal visualized paraspinal cervical soft tissues. IMPRESSION: 1. Mixed subdural and subarachnoid hemorrhage of the right hemisphere, predominantly anterior. No midline shift or other mass effect. 2. Nondisplaced left occipital skull fracture with large left posterior scalp hematoma. 3. No acute fracture or static subluxation of the cervical spine. Critical Value/emergent results were called by telephone at the time of interpretation on 10/26/2023 at 11:50 pm to provider JONATHAN GILLIAM , who verbally acknowledged these results. Electronically Signed   By: Juanetta Nordmann M.D.   On: 10/26/2023 23:51   CT CHEST ABDOMEN PELVIS W CONTRAST Result Date: 10/26/2023 CLINICAL DATA:  Blunt trauma, loss of balance, fell backwards and hit head, hypertension EXAM: CT CHEST, ABDOMEN, AND PELVIS WITH CONTRAST TECHNIQUE: Multidetector CT imaging of the chest, abdomen and pelvis was performed following the standard protocol during bolus administration of intravenous contrast. RADIATION DOSE REDUCTION: This exam was performed according to the departmental dose-optimization program which includes automated exposure control, adjustment of  the mA and/or kV according to patient size and/or use of iterative reconstruction technique. CONTRAST:  75mL OMNIPAQUE IOHEXOL 350 MG/ML SOLN COMPARISON:  06/16/2016, 06/05/2023 FINDINGS: CT CHEST FINDINGS Cardiovascular: The heart is enlarged without pericardial effusion. No evidence of thoracic aortic aneurysm or dissection. No evidence of vascular injury. Atherosclerosis of the aorta and coronary vasculature. Mediastinum/Nodes: No enlarged mediastinal, hilar, or axillary lymph nodes. Thyroid  gland, trachea, and esophagus demonstrate no significant findings. Lungs/Pleura: No acute airspace disease, effusion, or pneumothorax. Central airways are patent. Musculoskeletal: There is an acute appearing fracture through the superior endplate of the T10 vertebral body, with less than 10% loss of height. No retropulsion. No other acute bony abnormalities. Chronic appearing compression deformity within the inferior endplate of the T9 vertebral body. Reconstructed images demonstrate no additional findings. CT ABDOMEN PELVIS FINDINGS Hepatobiliary: Nodular contour of the liver consistent with cirrhosis. No focal parenchymal liver abnormality. No evidence of hepatic injury. The gallbladder is unremarkable. No biliary duct dilation. Pancreas: Unremarkable. No pancreatic ductal dilatation or surrounding inflammatory changes. Spleen: The spleen is enlarged, measuring 11.8 x 11.7 x 6.0 cm. No focal parenchymal abnormality. Adrenals/Urinary Tract: No adrenal hemorrhage or renal injury identified. Bladder is unremarkable. Stomach/Bowel: No bowel obstruction or ileus. No bowel wall thickening or inflammatory change. Vascular/Lymphatic: Aortic atherosclerosis. No enlarged abdominal or pelvic lymph nodes. Reproductive: Multiple calcified uterine fibroids. No adnexal masses. Other: No free fluid or free intraperitoneal gas. Small fat containing umbilical hernia. Musculoskeletal: Chronic appearing compression deformities are seen at T12,  L2, L3, L4, and L5, with no significant change since the prior exam. Reconstructed images demonstrate no additional findings. IMPRESSION: 1. Acute appearing compression deformity through the superior endplate of the T10 vertebral body, with less than 10% loss of vertebral body height. No retropulsion. 2. Otherwise no acute intrathoracic, intra-abdominal, or intrapelvic trauma. 3. 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 4. Other chronic thoracolumbar compression deformities as above. 5.  Aortic Atherosclerosis (ICD10-I70.0). 6. Cardiomegaly without pericardial effusion. Electronically Signed   By:  Bobbye Burrow M.D.   On: 10/26/2023 23:38   DG Chest Port 1 View Result Date: 10/26/2023 CLINICAL DATA:  Fall, chest are EXAM: PORTABLE CHEST 1 VIEW COMPARISON:  None Available. FINDINGS: Lungs are well expanded, symmetric, and clear. No pneumothorax or pleural effusion. Cardiac size within normal limits. Pulmonary vascularity is normal. Osseous structures are age-appropriate. No acute bone abnormality. IMPRESSION: 1. No active disease. Electronically Signed   By: Worthy Heads M.D.   On: 10/26/2023 22:26    EKG: Independently reviewed.  Normal sinus rhythm with prolonged QTc.  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 Larrie Po has been consulted.  Repeat CT head was requested after few hours.  Awaiting further recommendations. Hypertensive urgency on as needed IV labetalol continue with oral  lisinopril .  Follow blood pressure trends. Near syncopal episode with EKG showing prolonged QTc.  Will check 2D echo.  Closely monitoring telemetry. Prolonged QTc recheck EKG after correcting potassium.  Check magnesium levels and metabolic panel. Hypokalemia not sure of the cause but looks like patient was recently on diuretics which could be causing it.  Replace recheck will check magnesium. History of cirrhosis of the liver secondary to autoimmune hepatitis on prednisone being followed by [facility]. Leukocytosis could be secondary to reactionary or prednisone. GERD on PPI. Hyperlipidemia on Zetia .   Since patient has subarachnoid and subdural hematoma with occipital fracture near syncope prolonged QTc will need close monitoring and more than 2 midnight stay.   DVT prophylaxis: SCDs. Code Status: Full code. Family Communication: Patient's husband. Disposition Plan: Progressive care. Consults called: Neurosurgery. Admission status: Inpatient.

## 2023-10-27 NOTE — Progress Notes (Signed)
  Echocardiogram 2D Echocardiogram has been performed.  Miranda Cochran 10/27/2023, 3:13 PM

## 2023-10-27 NOTE — Progress Notes (Signed)
 [facility]                                                                               [name], is a 73 y.o. female, DOB - 26-Jun-1950, PPI:[id number] Admit date - 10/26/2023    Outpatient Primary MD for the patient is Donley Furth, MD  LOS - 0  days    Brief summary   Miranda Cochran is a 73 y.o. female with history of autoimmune hepatitis with cirrhosis of liver, hypertension, depression, GERD, hyperlipidemia was brought to the ER patient had a fall and hit her head.  Patient states she went to get groceries.  On arriving at home patient was feeling dizzy.  When she sat on the chair as she fell backwards and hit her head.  She had to crawl to her neighbor's house and EMS was called and patient was brought to the ER.    Assessment & Plan    Assessment and Plan:   Acute subdural hematoma along the right cerebral convexity  Worsened when compared to the CT yesterday. She is more lethargic, not following commands.  NS on board.  PCCM consulted and he was transferred to ICU for airway/ intubation.    Estimated body mass index is 20.81 kg/m as calculated from the following:   Height as of this encounter: 5\' 4"  (1.626 m).   Weight as of this encounter: 55 kg.  Code Status: full code.  DVT Prophylaxis:  Place and maintain sequential compression device Start: 10/27/23 1110 SCDs Start: 10/27/23 0329   Level of Care: Level of care: ICU Family Communication: Updated patient's husband at bedside.   Disposition Plan:     Remains inpatient appropriate:  further work up.   Procedures:  CT head.   Consultants:   PCCM NEUROLOGY NS  Antimicrobials:   Anti-infectives (From admission, onward)    None        Medications  Scheduled Meds:  cyanocobalamin  500 mcg Oral Daily   ezetimibe   10 mg Oral Daily   fentaNYL (SUBLIMAZE) injection  50 mcg Intravenous Once   hydrocortisone sod succinate (SOLU-CORTEF) inj  100 mg Intravenous Daily   insulin   aspart  0-20 Units Subcutaneous Q4H   levETIRAcetam  2,000 mg Intravenous STAT   mannitol 25% IV solution  50 g Intravenous Once   midazolam   2 mg Intravenous Once   pantoprazole (PROTONIX) IV  40 mg Intravenous QHS   rocuronium  60 mg Intravenous Once   Continuous Infusions:  magnesium sulfate bolus IVPB     magnesium sulfate bolus IVPB     norepinephrine (LEVOPHED) Adult infusion     phenylephrine (NEO-SYNEPHRINE) Adult infusion     potassium chloride  10 mEq (10/27/23 1044)   propofol  (DIPRIVAN ) infusion 5 mcg/kg/min (10/27/23 1112)   tranexamic acid     PRN Meds:.etomidate, fentaNYL (SUBLIMAZE) injection, fentaNYL (SUBLIMAZE) injection, hydrALAZINE, rocuronium    Subjective:   Miranda Cochran was seen and examined today.  She is lethargic, not following commands. Just opening eyes to verbal cues.   Objective:   Vitals:   10/27/23 1004 10/27/23 1030 10/27/23 1045 10/27/23 1055  BP:  (!) 141/91 92/71 Aaron Aas)  84/67  Pulse:  80 (!) 150 (!) 145  Resp:  (!) 29 (!) 21 (!) 34  Temp: 99.2 F (37.3 C)     TempSrc:      SpO2:  100% 99% 100%  Weight:      Height:       No intake or output data in the 24 hours ending 10/27/23 1121 Filed Weights   10/26/23 2200  Weight: 55 kg     Exam Ill appearing lady not in distress.  Lethargic not following commands.   Data Reviewed:  I have personally reviewed following labs and imaging studies   CBC Lab Results  Component Value Date   WBC 14.7 (H) 10/27/2023   RBC 3.82 (L) 10/27/2023   HGB 12.3 10/27/2023   HCT 36.9 10/27/2023   MCV 96.6 10/27/2023   MCH 32.2 10/27/2023   PLT 177 10/27/2023   MCHC 33.3 10/27/2023   RDW 13.5 10/27/2023   LYMPHSABS 2.2 02/25/2022   MONOABS 0.6 02/25/2022   EOSABS 0.2 02/25/2022   BASOSABS 0.0 02/25/2022     Last metabolic panel Lab Results  Component Value Date   NA 135 10/27/2023   K 2.9 (L) 10/27/2023   CL 100 10/27/2023   CO2 22 10/27/2023   BUN 7 (L) 10/27/2023   CREATININE 0.52  10/27/2023   GLUCOSE 291 (H) 10/27/2023   GFRNONAA >60 10/27/2023   GFRAA >89 11/01/2015   CALCIUM  7.8 (L) 10/27/2023   PROT 7.2 10/26/2023   ALBUMIN 3.9 10/26/2023   BILITOT 1.0 10/26/2023   ALKPHOS 118 10/26/2023   AST 52 (H) 10/26/2023   ALT 29 10/26/2023   ANIONGAP 13 10/27/2023    CBG (last 3)  Recent Labs    10/26/23 2158 10/27/23 0659  GLUCAP 148* 284*      Coagulation Profile: Recent Labs  Lab 10/26/23 2150  INR 1.1     Radiology Studies: CT HEAD WO CONTRAST ( ) Addendum Date: 10/27/2023 ADDENDUM REPORT: 10/27/2023 09:03 ADDENDUM: These results were called by telephone at the time of interpretation on 10/27/2023 at 9:03 am to provider Dr. Nichole Barker, who verbally acknowledged these results. Electronically Signed   By: Bascom Lily D.O.   On: 10/27/2023 09:03   Result Date: 10/27/2023 CLINICAL DATA:  Head trauma, intracranial arterial injury suspected. EXAM: CT HEAD WITHOUT CONTRAST TECHNIQUE: Contiguous axial images were obtained from the base of the skull through the vertex without intravenous contrast. RADIATION DOSE REDUCTION: This exam was performed according to the departmental dose-optimization program which includes automated exposure control, adjustment of the mA and/or kV according to patient size and/or use of iterative reconstruction technique. COMPARISON:  Head CT 10/26/2023. FINDINGS: Brain: Interval increase in size of an acute subdural hematoma along the right cerebral convexity (greatest along the anterior right frontal lobe), now measuring up to 9 mm in thickness (previously 7 mm). Acute subarachnoid hemorrhage scattered along the right cerebral convexity, increased. Hemorrhagic parenchymal contusions within the anterior right temporal lobe and anteroinferior right frontal lobe have increased in size. For instance, the largest hemorrhagic parenchymal contusion within the anterior right temporal lobe now measures 18 mm (previously 11 mm) (series 2, image 16).  Trace acute subarachnoid hemorrhage is present along the anterolateral left temporal lobe, new from the prior exam (series 2, image 15). Possible small underlying left temporal lobe hemorrhagic parenchymal contusions. Thin subdural hemorrhage layering along the tentorium, bilaterally, similar to the prior exam. A 2.3 x 2.6 cm hemorrhagic parenchymal contusion within the left cerebellar hemisphere has increased  in size (previously 2.1 x 1.8 cm). Trace subarachnoid hemorrhage questioned within the inferior aspect of the fourth ventricle (versus prominent choroid plexus). No midline shift or hydrocephalus. Vascular: No hyperdense vessel.  Atherosclerotic calcifications. Skull: Non-depressed fracture of the left occipital calvarium again noted. Sinuses/Orbits: No orbital mass or acute orbital finding. Mild mucosal thickening within the right ethmoid and left maxillary sinuses. Other: Left occipital scalp hematoma and laceration. Attempts are being made to reach the ordering provider at this time. IMPRESSION: 1. Interval increase in size of an acute subdural hematoma along the right cerebral convexity (greatest along the anterior right frontal lobe), now measuring up to 9 mm in thickness (previously 7 mm). 2. Acute subarachnoid hemorrhage scattered along the right cerebral convexity, increased. 3. Hemorrhagic parenchymal contusions within the anterior right temporal lobe and anteroinferior right frontal lobe have increased in size. 4. Trace acute subarachnoid hemorrhage along the anterolateral left temporal lobe, new from the prior exam. Possible small underlying left temporal lobe hemorrhagic parenchymal contusions. 5. A 2.3 x 2.6 cm hemorrhagic parenchymal contusion within the left cerebellar hemisphere has increased in size (previously 2.1 x 1.8 cm). 6. Thin subdural hemorrhage layering along the tentorium, bilaterally, similar to the prior exam. 7. Trace subarachnoid hemorrhage (versus prominent choroid plexus)  within the inferior aspect of the fourth ventricle. 8. Non-depressed fracture of the left occipital calvarium. 9. Left occipital scalp hematoma and laceration. Electronically Signed: By: Bascom Lily D.O. On: 10/27/2023 08:44   CT HEAD WO CONTRAST Result Date: 10/26/2023 CLINICAL DATA:  Fall EXAM: CT HEAD WITHOUT CONTRAST CT CERVICAL SPINE WITHOUT CONTRAST TECHNIQUE: Multidetector CT imaging of the head and cervical spine was performed following the standard protocol without intravenous contrast. Multiplanar CT image reconstructions of the cervical spine were also generated. RADIATION DOSE REDUCTION: This exam was performed according to the departmental dose-optimization program which includes automated exposure control, adjustment of the mA and/or kV according to patient size and/or use of iterative reconstruction technique. COMPARISON:  None Available. FINDINGS: CT HEAD FINDINGS Brain: There is mixed subdural and subarachnoid hemorrhage of the right hemisphere, predominantly anterior. There is a small amount of subdural blood layering along the falx cerebrum. No midline shift or other mass effect. No hydrocephalus. Vascular: Atherosclerotic calcification of the vertebral and internal carotid arteries at the skull base. No abnormal hyperdensity of the major intracranial arteries or dural venous sinuses. Skull: Large left posterior scalp hematoma. Nondisplaced left occipital skull fracture. Sinuses/Orbits: No fluid levels or advanced mucosal thickening of the visualized paranasal sinuses. No mastoid or middle ear effusion. Normal orbits. Other: None. CT CERVICAL SPINE FINDINGS Alignment: Grade 1 anterolisthesis at C4-5. Skull base and vertebrae: No acute fracture. Soft tissues and spinal canal: No prevertebral fluid or swelling. No visible canal hematoma. Disc levels: No advanced spinal canal or neural foraminal stenosis. Upper chest: No pneumothorax, pulmonary nodule or pleural effusion. Other: Normal visualized  paraspinal cervical soft tissues. IMPRESSION: 1. Mixed subdural and subarachnoid hemorrhage of the right hemisphere, predominantly anterior. No midline shift or other mass effect. 2. Nondisplaced left occipital skull fracture with large left posterior scalp hematoma. 3. No acute fracture or static subluxation of the cervical spine. Critical Value/emergent results were called by telephone at the time of interpretation on 10/26/2023 at 11:50 pm to provider JONATHAN GILLIAM , who verbally acknowledged these results. Electronically Signed   By: Juanetta Nordmann M.D.   On: 10/26/2023 23:51   CT CERVICAL SPINE WO CONTRAST Result Date: 10/26/2023 CLINICAL DATA:  Fall EXAM: CT  HEAD WITHOUT CONTRAST CT CERVICAL SPINE WITHOUT CONTRAST TECHNIQUE: Multidetector CT imaging of the head and cervical spine was performed following the standard protocol without intravenous contrast. Multiplanar CT image reconstructions of the cervical spine were also generated. RADIATION DOSE REDUCTION: This exam was performed according to the departmental dose-optimization program which includes automated exposure control, adjustment of the mA and/or kV according to patient size and/or use of iterative reconstruction technique. COMPARISON:  None Available. FINDINGS: CT HEAD FINDINGS Brain: There is mixed subdural and subarachnoid hemorrhage of the right hemisphere, predominantly anterior. There is a small amount of subdural blood layering along the falx cerebrum. No midline shift or other mass effect. No hydrocephalus. Vascular: Atherosclerotic calcification of the vertebral and internal carotid arteries at the skull base. No abnormal hyperdensity of the major intracranial arteries or dural venous sinuses. Skull: Large left posterior scalp hematoma. Nondisplaced left occipital skull fracture. Sinuses/Orbits: No fluid levels or advanced mucosal thickening of the visualized paranasal sinuses. No mastoid or middle ear effusion. Normal orbits. Other:  None. CT CERVICAL SPINE FINDINGS Alignment: Grade 1 anterolisthesis at C4-5. Skull base and vertebrae: No acute fracture. Soft tissues and spinal canal: No prevertebral fluid or swelling. No visible canal hematoma. Disc levels: No advanced spinal canal or neural foraminal stenosis. Upper chest: No pneumothorax, pulmonary nodule or pleural effusion. Other: Normal visualized paraspinal cervical soft tissues. IMPRESSION: 1. Mixed subdural and subarachnoid hemorrhage of the right hemisphere, predominantly anterior. No midline shift or other mass effect. 2. Nondisplaced left occipital skull fracture with large left posterior scalp hematoma. 3. No acute fracture or static subluxation of the cervical spine. Critical Value/emergent results were called by telephone at the time of interpretation on 10/26/2023 at 11:50 pm to provider JONATHAN GILLIAM , who verbally acknowledged these results. Electronically Signed   By: Juanetta Nordmann M.D.   On: 10/26/2023 23:51   CT CHEST ABDOMEN PELVIS W CONTRAST Result Date: 10/26/2023 CLINICAL DATA:  Blunt trauma, loss of balance, fell backwards and hit head, hypertension EXAM: CT CHEST, ABDOMEN, AND PELVIS WITH CONTRAST TECHNIQUE: Multidetector CT imaging of the chest, abdomen and pelvis was performed following the standard protocol during bolus administration of intravenous contrast. RADIATION DOSE REDUCTION: This exam was performed according to the departmental dose-optimization program which includes automated exposure control, adjustment of the mA and/or kV according to patient size and/or use of iterative reconstruction technique. CONTRAST:  75mL OMNIPAQUE IOHEXOL 350 MG/ML SOLN COMPARISON:  06/16/2016, 06/05/2023 FINDINGS: CT CHEST FINDINGS Cardiovascular: The heart is enlarged without pericardial effusion. No evidence of thoracic aortic aneurysm or dissection. No evidence of vascular injury. Atherosclerosis of the aorta and coronary vasculature. Mediastinum/Nodes: No enlarged  mediastinal, hilar, or axillary lymph nodes. Thyroid  gland, trachea, and esophagus demonstrate no significant findings. Lungs/Pleura: No acute airspace disease, effusion, or pneumothorax. Central airways are patent. Musculoskeletal: There is an acute appearing fracture through the superior endplate of the T10 vertebral body, with less than 10% loss of height. No retropulsion. No other acute bony abnormalities. Chronic appearing compression deformity within the inferior endplate of the T9 vertebral body. Reconstructed images demonstrate no additional findings. CT ABDOMEN PELVIS FINDINGS Hepatobiliary: Nodular contour of the liver consistent with cirrhosis. No focal parenchymal liver abnormality. No evidence of hepatic injury. The gallbladder is unremarkable. No biliary duct dilation. Pancreas: Unremarkable. No pancreatic ductal dilatation or surrounding inflammatory changes. Spleen: The spleen is enlarged, measuring 11.8 x 11.7 x 6.0 cm. No focal parenchymal abnormality. Adrenals/Urinary Tract: No adrenal hemorrhage or renal injury identified. Bladder is unremarkable.  Stomach/Bowel: No bowel obstruction or ileus. No bowel wall thickening or inflammatory change. Vascular/Lymphatic: Aortic atherosclerosis. No enlarged abdominal or pelvic lymph nodes. Reproductive: Multiple calcified uterine fibroids. No adnexal masses. Other: No free fluid or free intraperitoneal gas. Small fat containing umbilical hernia. Musculoskeletal: Chronic appearing compression deformities are seen at T12, L2, L3, L4, and L5, with no significant change since the prior exam. Reconstructed images demonstrate no additional findings. IMPRESSION: 1. Acute appearing compression deformity through the superior endplate of the T10 vertebral body, with less than 10% loss of vertebral body height. No retropulsion. 2. Otherwise no acute intrathoracic, intra-abdominal, or intrapelvic trauma. 3. Cirrhosis, with portal venous hypertension manifested by  splenomegaly. 4. Other chronic thoracolumbar compression deformities as above. 5.  Aortic Atherosclerosis (ICD10-I70.0). 6. Cardiomegaly without pericardial effusion. Electronically Signed   By: Bobbye Burrow M.D.   On: 10/26/2023 23:38   DG Chest Port 1 View Result Date: 10/26/2023 CLINICAL DATA:  Fall, chest are EXAM: PORTABLE CHEST 1 VIEW COMPARISON:  None Available. FINDINGS: Lungs are well expanded, symmetric, and clear. No pneumothorax or pleural effusion. Cardiac size within normal limits. Pulmonary vascularity is normal. Osseous structures are age-appropriate. No acute bone abnormality. IMPRESSION: 1. No active disease. Electronically Signed   By: Worthy Heads M.D.   On: 10/26/2023 22:26       Feliciana Horn M.D. [facility] 10/27/2023, 11:21 AM  Available via Epic secure chat 7am-7pm After 7 pm, please refer to night coverage provider listed on amion.

## 2023-10-27 NOTE — ED Notes (Signed)
 Pt found to be exteremly lethargic while being taken to bedside commode by nursing tech, slumped over. Rn called to bedside along with Research officer, trade [employer]. Pt found to have HR in 160s with a bp of 130/110. Nichole Barker MD notified via secure chat and Tegeler MD called to bedisde. EKG taken and given to Tegeler MD. Pt placed on pads and RT notified per Tegeler MD

## 2023-10-27 NOTE — Progress Notes (Signed)
 Patient was transported to CT on full vent support and back to 4N24 without any complications.

## 2023-10-27 NOTE — ED Provider Notes (Signed)
 Patient signed out to me at shift change.  Patient from home after a fall.  She hit her head and sustained occipital skull fracture and mixed subdural and subarachnoid bleeds.  She states that she has never passed out before, so she is not certain what happened.   She complains headache.  2350 I was called to the exam room due to extensive bleeding form the patient's posterior scalp hematoma.  I was able to place two figure of eight sutures and achieve hemostasis.  The wound was cleaned and revealed macerated skin without laceration.  Will consult with neurosurgery.  Patient will need admission for syncope, hypokalemia, and blood pressure management.  I consulted with Dr. [facility] Lavender, who is appreciated for admitting. I consulted with Dr. Larrie Po, who is appreciated for consulting.  Recommends repeat head CT in the AM.  He will consult in AM.  Physical Exam  BP (!) 177/96   Pulse 95   Temp 98.4 F (36.9 C) (Oral)   Resp (!) 25   Ht 5\' 4"  (1.626 m)   Wt 55 kg   SpO2 98%   BMI 20.81 kg/m   Physical Exam  Procedures  .Laceration Repair  Date/Time: 10/27/2023 12:22 AM  Performed by: Sherel Dikes, PA-C Authorized by: Sherel Dikes, PA-C   Consent:    Consent obtained:  Verbal and emergent situation   Risks discussed:  Infection and need for additional repair   Alternatives discussed:  No treatment Universal protocol:    Procedure explained and questions answered to patient or proxy's satisfaction: yes     Relevant documents present and verified: yes     Test results available: yes     Imaging studies available: yes     Required blood products, implants, devices, and special equipment available: yes     Site/side marked: yes     Immediately prior to procedure, a time out was called: yes     Patient identity confirmed:  Verbally with patient Anesthesia:    Anesthesia method:  Local infiltration   Local anesthetic:  Lidocaine  1% WITH epi Laceration details:    Location:   Scalp   Length (cm):  0.5 Exploration:    Hemostasis achieved with:  Direct pressure   Wound extent: underlying fracture     Contaminated: no   Treatment:    Area cleansed with:  Saline   Irrigation solution:  Sterile saline Skin repair:    Repair method:  Sutures   Suture size:  4-0   Suture technique:  Figure eight   Number of sutures:  2 Approximation:    Approximation:  Close Repair type:    Repair type:  Simple Post-procedure details:    Dressing:  Bulky dressing   Procedure completion:  Tolerated well, no immediate complications .Critical Care  Performed by: Sherel Dikes, PA-C Authorized by: Sherel Dikes, PA-C   Critical care provider statement:    Critical care time (minutes):  44   Critical care was necessary to treat or prevent imminent or life-threatening deterioration of the following conditions:  CNS failure or compromise   Critical care was time spent personally by me on the following activities:  Development of treatment plan with patient or surrogate, discussions with consultants, evaluation of patient's response to treatment, examination of patient, ordering and review of laboratory studies, ordering and review of radiographic studies, ordering and performing treatments and interventions, pulse oximetry, re-evaluation of patient's condition and review of old charts   ED Course / MDM    Medical  Decision Making I consulted with Dr. Larrie Po, who recommends repeat CT scan in the morning.  Dr. Larrie Po to consult in the morning.    Amount and/or Complexity of Data Reviewed Labs: ordered. Radiology: ordered.  Risk Prescription drug management. Decision regarding hospitalization.          Sherel Dikes, PA-C 10/27/23 0111    Albertus Hughs, DO 10/27/23 0111

## 2023-10-27 NOTE — Progress Notes (Signed)
 Arrived to room for EEG pt having central line placed. RN said patient has a clean bed on 4N. Will try back for EEG as schedule permits.

## 2023-10-27 NOTE — Progress Notes (Signed)
STAT LTM EEG hooked up and running - no initial skin breakdown - push button tested - [facility] monitoring. CT leads used

## 2023-10-27 NOTE — ED Notes (Signed)
 Pt being transported otf with RN transport and RT, pt is in no new onset distress.

## 2023-10-27 NOTE — Procedures (Signed)
 Central Venous Catheter Insertion Procedure Note  YATANA PIPE  [id number]  1951-03-03  Date:10/27/23  Time:11:43 AM   Provider Performing:Shem Plemmons   Procedure: Insertion of Non-tunneled Central Venous [phone]) with US  guidance ([id number])   Indication(s) Medication administration, Difficult access, and Hemodialysis  Consent Risks of the procedure as well as the alternatives and risks of each were explained to the patient and/or caregiver.  Consent for the procedure was obtained and is signed in the bedside chart  Anesthesia Topical only with 1% lidocaine    Timeout Verified patient identification, verified procedure, site/side was marked, verified correct patient position, special equipment/implants available, medications/allergies/relevant history reviewed, required imaging and test results available.  Sterile Technique Maximal sterile technique including full sterile barrier drape, hand hygiene, sterile gown, sterile gloves, mask, hair covering, sterile ultrasound probe cover (if used).  Procedure Description Area of catheter insertion was cleaned with chlorhexidine and draped in sterile fashion.  With real-time ultrasound guidance a central venous catheter was placed into the left internal jugular vein. Nonpulsatile blood flow and easy flushing noted in all ports.  The catheter was sutured in place and sterile dressing applied.  Complications/Tolerance None; patient tolerated the procedure well. Chest X-ray is ordered to verify placement for internal jugular or subclavian cannulation.   Chest x-ray is not ordered for femoral cannulation.  EBL Minimal  Specimen(s)  None  Siegfried Dress Ikeya Brockel ACNP Acute Care Nurse Practitioner [facility] Pulmonary/Critical Care Please consult Amion 10/27/2023, 11:48 AM

## 2023-10-28 ENCOUNTER — Inpatient Hospital Stay (HOSPITAL_COMMUNITY)

## 2023-10-28 DIAGNOSIS — S065XAA Traumatic subdural hemorrhage with loss of consciousness status unknown, initial encounter: Secondary | ICD-10-CM | POA: Diagnosis not present

## 2023-10-28 DIAGNOSIS — E43 Unspecified severe protein-calorie malnutrition: Secondary | ICD-10-CM | POA: Insufficient documentation

## 2023-10-28 DIAGNOSIS — Z515 Encounter for palliative care: Secondary | ICD-10-CM | POA: Diagnosis not present

## 2023-10-28 DIAGNOSIS — R569 Unspecified convulsions: Secondary | ICD-10-CM | POA: Diagnosis not present

## 2023-10-28 DIAGNOSIS — E876 Hypokalemia: Secondary | ICD-10-CM | POA: Diagnosis not present

## 2023-10-28 DIAGNOSIS — Z7189 Other specified counseling: Secondary | ICD-10-CM | POA: Diagnosis not present

## 2023-10-28 DIAGNOSIS — G9340 Encephalopathy, unspecified: Secondary | ICD-10-CM | POA: Diagnosis not present

## 2023-10-28 DIAGNOSIS — E162 Hypoglycemia, unspecified: Secondary | ICD-10-CM

## 2023-10-28 LAB — GLUCOSE, CAPILLARY
Glucose-Capillary: 123 mg/dL — ABNORMAL HIGH (ref 70–99)
Glucose-Capillary: 141 mg/dL — ABNORMAL HIGH (ref 70–99)
Glucose-Capillary: 146 mg/dL — ABNORMAL HIGH (ref 70–99)
Glucose-Capillary: 153 mg/dL — ABNORMAL HIGH (ref 70–99)
Glucose-Capillary: 168 mg/dL — ABNORMAL HIGH (ref 70–99)
Glucose-Capillary: 176 mg/dL — ABNORMAL HIGH (ref 70–99)
Glucose-Capillary: 190 mg/dL — ABNORMAL HIGH (ref 70–99)
Glucose-Capillary: 47 mg/dL — ABNORMAL LOW (ref 70–99)

## 2023-10-28 LAB — BASIC METABOLIC PANEL WITH GFR
Anion gap: 7 (ref 5–15)
BUN: 7 mg/dL — ABNORMAL LOW (ref 8–23)
CO2: 22 mmol/L (ref 22–32)
Calcium: 7 mg/dL — ABNORMAL LOW (ref 8.9–10.3)
Chloride: 108 mmol/L (ref 98–111)
Creatinine, Ser: 0.47 mg/dL (ref 0.44–1.00)
GFR, Estimated: >60 mL/min (ref >60)
Glucose, Bld: 206 mg/dL — ABNORMAL HIGH (ref 70–99)
Potassium: 4.6 mmol/L (ref 3.5–5.1)
Sodium: 137 mmol/L (ref 135–145)

## 2023-10-28 LAB — CBC
HCT: 28.6 % — ABNORMAL LOW (ref 36.0–46.0)
Hemoglobin: 9.7 g/dL — ABNORMAL LOW (ref 12.0–15.0)
MCH: 33.3 pg (ref 26.0–34.0)
MCHC: 33.9 g/dL (ref 30.0–36.0)
MCV: 98.3 fL (ref 80.0–100.0)
Platelets: 138 10*3/uL — ABNORMAL LOW (ref 150–400)
RBC: 2.91 MIL/uL — ABNORMAL LOW (ref 3.87–5.11)
RDW: 14 % (ref 11.5–15.5)
WBC: 11.8 10*3/uL — ABNORMAL HIGH (ref 4.0–10.5)
nRBC: 0 % (ref 0.0–0.2)

## 2023-10-28 LAB — COMPREHENSIVE METABOLIC PANEL WITH GFR
ALT: 16 U/L (ref 0–44)
AST: 22 U/L (ref 15–41)
Albumin: 2.6 g/dL — ABNORMAL LOW (ref 3.5–5.0)
Alkaline Phosphatase: 72 U/L (ref 38–126)
Anion gap: 10 (ref 5–15)
BUN: 7 mg/dL — ABNORMAL LOW (ref 8–23)
CO2: 22 mmol/L (ref 22–32)
Calcium: 7.7 mg/dL — ABNORMAL LOW (ref 8.9–10.3)
Chloride: 105 mmol/L (ref 98–111)
Creatinine, Ser: 0.46 mg/dL (ref 0.44–1.00)
GFR, Estimated: >60 mL/min (ref >60)
Glucose, Bld: 112 mg/dL — ABNORMAL HIGH (ref 70–99)
Potassium: 2.8 mmol/L — ABNORMAL LOW (ref 3.5–5.1)
Sodium: 137 mmol/L (ref 135–145)
Total Bilirubin: 1.3 mg/dL — ABNORMAL HIGH (ref 0.0–1.2)
Total Protein: 5.4 g/dL — ABNORMAL LOW (ref 6.5–8.1)

## 2023-10-28 LAB — TRIGLYCERIDES: Triglycerides: 223 mg/dL — ABNORMAL HIGH (ref <150)

## 2023-10-28 LAB — MAGNESIUM
Magnesium: 2.4 mg/dL (ref 1.7–2.4)
Magnesium: 2.2 mg/dL (ref 1.7–2.4)

## 2023-10-28 LAB — PHOSPHORUS
Phosphorus: 2.2 mg/dL — ABNORMAL LOW (ref 2.5–4.6)
Phosphorus: 4.1 mg/dL (ref 2.5–4.6)

## 2023-10-28 MED ORDER — DEXTROSE 50 % IV SOLN
25.0000 g | INTRAVENOUS | Status: AC
  Administered 2023-10-28: 25 g via INTRAVENOUS

## 2023-10-28 MED ORDER — OSMOLITE 1.5 CAL PO LIQD
1000.0000 mL | ORAL | Status: DC
  Administered 2023-10-28 – 2023-11-11 (×13): 1000 mL
  Filled 2023-10-28 (×6): qty 1000

## 2023-10-28 MED ORDER — POTASSIUM CHLORIDE 10 MEQ/50ML IV SOLN
10.0000 meq | INTRAVENOUS | Status: AC
  Administered 2023-10-28 (×8): 10 meq via INTRAVENOUS
  Filled 2023-10-28 (×8): qty 50

## 2023-10-28 MED ORDER — SODIUM PHOSPHATES 45 MMOLE/15ML IV SOLN
15.0000 mmol | Freq: Once | INTRAVENOUS | Status: AC
  Administered 2023-10-28: 15 mmol via INTRAVENOUS
  Filled 2023-10-28: qty 5

## 2023-10-28 MED ORDER — DEXTROSE 50 % IV SOLN
INTRAVENOUS | Status: AC
  Filled 2023-10-28: qty 50

## 2023-10-28 MED ORDER — ORAL CARE MOUTH RINSE: 15.0000 mL | OROMUCOSAL | Status: DC | PRN

## 2023-10-28 MED ORDER — THIAMINE MONONITRATE 100 MG PO TABS
100.0000 mg | ORAL_TABLET | Freq: Every day | ORAL | Status: DC
  Administered 2023-10-28 – 2023-11-11 (×15): 100 mg
  Filled 2023-10-28 (×16): qty 1

## 2023-10-28 MED ORDER — PROSOURCE TF20 ENFIT COMPATIBL EN LIQD
60.0000 mL | Freq: Every day | ENTERAL | Status: DC
  Administered 2023-10-28 – 2023-11-11 (×15): 60 mL
  Filled 2023-10-28 (×15): qty 60

## 2023-10-28 MED ORDER — ADULT MULTIVITAMIN W/MINERALS CH
1.0000 | ORAL_TABLET | Freq: Every day | ORAL | Status: DC
  Administered 2023-10-28 – 2023-11-11 (×15): 1
  Filled 2023-10-28 (×15): qty 1

## 2023-10-28 MED ORDER — ORAL CARE MOUTH RINSE
15.0000 mL | OROMUCOSAL | Status: DC
  Administered 2023-10-28 – 2023-11-04 (×86): 15 mL via OROMUCOSAL

## 2023-10-28 NOTE — Procedures (Addendum)
 Patient Name: Miranda Cochran  MRN: [id number]  Epilepsy Attending: Arleene Lack  Referring Physician/Provider: Josiah Nigh, MD  Duration: 10/27/2023 1319 to 10/28/2023 1319  Patient history: 72yo F with right occipital fracture with acute subarachnoid hemorrhage, multiple contusions and a right subdural hematoma. EEG to evaluate for seizure  Level of alertness: comatose/ lethargic  AEDs during EEG study: LEV, propofol   Technical aspects: This EEG study was done with scalp electrodes positioned according to the 10-20 International system of electrode placement. Electrical activity was reviewed with band pass filter of 1-70Hz , sensitivity of 7 uV/mm, display speed of 35mm/sec with a 60Hz  notched filter applied as appropriate. EEG data were recorded continuously and digitally stored.  Video monitoring was available and reviewed as appropriate.  Description: EEG showed continuous generalized and lateralized right hemisphere 3 to 6 Hz theta-delta slowing with overriding 12-14hz  beta activity. Hyperventilation and photic stimulation were not performed.     ABNORMALITY - Continuous slow, generalized and lateralized right hemisphere  IMPRESSION: This study is suggestive of cortical dysfunction arising from right hemisphere likely secondary to underlying structural abnormality. Additionally there is moderate to severe diffuse encephalopathy likely related to sedation. No seizures or epileptiform discharges were seen throughout the recording.   Amarri Michaelson O Kataleia Quaranta

## 2023-10-28 NOTE — Progress Notes (Signed)
 [facility] ADULT ICU REPLACEMENT PROTOCOL   The patient does apply for the [facility] Adult ICU Electrolyte Replacment Protocol based on the criteria listed below:   1.Exclusion criteria: TCTS, ECMO, Dialysis, and Myasthenia Gravis patients 2. Is GFR >/= 30 ml/min? Yes.    Patient's GFR today is >60 3. Is SCr </= 2? Yes.   Patient's SCr is 0.46 mg/dL 4. Did SCr increase >/= 0.5 in 24 hours? No. 5.Pt's weight >40kg  Yes.   6. Abnormal electrolyte(s): Potassium  7. Electrolytes replaced per protocol 8.  Call MD STAT for K+ </= 2.5, Phos </= 1, or Mag </= 1 Physician:  Dr. Ogan  Miranda Cochran Miranda Cochran 10/28/2023 6:00 AM

## 2023-10-28 NOTE — Progress Notes (Addendum)
 Initial Nutrition Assessment  DOCUMENTATION CODES:   Severe malnutrition in context of chronic illness  INTERVENTION:   Initiate tube feeding via OG tube: Osmolite 1.5 at 25 ml/h and increase by 10 ml every 8 hours to goal rate of 45 ml/hr (1080 ml per day)  Prosource TF20 60 ml daily  Provides 1700 kcal, 87 gm protein, 820 ml free water daily  100 mg thiamine daily via tube  MVI with minerals daily via tube  Monitor magnesium and phosphorus daily x 4 occurrences, MD to replete as needed, as pt is at risk for refeeding syndrome given pt meets criteria for severe malnutrition.   NUTRITION DIAGNOSIS:   Severe Malnutrition related to chronic illness as evidenced by severe fat depletion, severe muscle depletion.  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   5/14 - admit 5/15 - intubated  Pt discussed during ICU rounds and with RN and MD.   Miranda Cochran with husband and sister who are at bedside. Per pt's husband pt eating normally for her, unsure of weight loss. Per husband pt drinks champagne 2-3 days per week and likes wine.    Medications reviewed and include: vitamin B12 500 mcg daily, solu-cortef, SSI every 4 hours, keppra, protonix Fentanyl  10 mEq KCl x 8  Propofol  @  15 mmol Naphos x 1   Labs reviewed:  K 2.8 Phos 2.2 TG 223 Total Bili 1.3 A1C 6.8 CBG's: 47-190  16 F OG tube; tip near pylorus per xray   NUTRITION - FOCUSED PHYSICAL EXAM:  Flowsheet Row Most Recent Value  Orbital Region Severe  depletion  Upper Arm Region Severe depletion  Thoracic and Lumbar Region Severe depletion  Buccal Region Unable to assess  Temple Region Moderate depletion  Clavicle Bone Region Moderate depletion  Clavicle and Acromion Bone Region Severe depletion  Scapular Bone Region Severe depletion  Dorsal Hand Severe depletion  Patellar Region Severe depletion  Anterior Thigh Region Severe depletion  Posterior Calf Region Severe depletion  Edema (RD Assessment) None  Hair Reviewed  Eyes Unable to assess  Mouth Unable to assess  Skin Reviewed  Nails Unable to assess       Diet Order:   Diet Order             Diet NPO time specified  Diet effective now                   EDUCATION NEEDS:   Not appropriate for education at this time  Skin:  Skin Assessment: Reviewed RN Assessment  Last BM:  unknown  Height:   Ht Readings from Last 1 Encounters:  10/26/23 5\' 4"  (1.626 m)    Weight:   Wt Readings from Last 1 Encounters:  10/26/23 55 kg    BMI:  Body mass index is 20.81 kg/m.  Estimated Nutritional Needs:   Kcal:  1600-1800  Protein:  85-100 grams  Fluid:  > 1.6 L/day  Randine Butcher., RD, LDN, CNSC See AMiON for contact information

## 2023-10-28 NOTE — TOC CM/SW Note (Signed)
 Transition of Care [facility]) - Inpatient Brief Assessment   Patient Details  Name: Miranda Cochran MRN: [id number] Date of Birth: 30-Dec-1950  Transition of Care [facility]) CM/SW Contact:    Jannice Mends, LCSW Phone Number: 10/28/2023, 9:35 AM   Clinical Narrative: Patient admitted from home with spouse for subdural hematoma. Patient is currently intubated. Please place [facility] consult if needs arise.    Transition of Care Asessment: Insurance and Status: Insurance coverage has been reviewed Patient has primary care physician: Yes Home environment has been reviewed: from home Prior level of function:: Independent Prior/Current Home Services: No current home services Social Drivers of Health Review: SDOH reviewed no interventions necessary Readmission risk has been reviewed: Yes Transition of care needs: no transition of care needs at this time

## 2023-10-28 NOTE — TOC CAGE-AID Note (Signed)
 Transition of Care [facility]) - CAGE-AID Screening   Patient Details  Name: Miranda Cochran MRN: [id number] Date of Birth: [birth year]  Transition [facility]) CM/SW Contact:    Jannice Mends, LCSW Phone Number: 10/28/2023, 9:11 AM   Clinical Narrative: Patient intubated and unable to participate in screening at this time.    CAGE-AID Screening: Substance Abuse Screening unable to be completed due to: : Patient unable to participate

## 2023-10-28 NOTE — Plan of Care (Signed)
 Patient tolerated ventilator and EEG with ordered sedation and analgesic drips. No acute events.

## 2023-10-28 NOTE — Consult Note (Signed)
 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  Has history of recurrent falls at home for unclear reason per husband at bedside.  Was being managed expectantly with NSGY following when had abrupt reduced LOS and tachyarrythmia.  On my arrival in SVT with pursed lip breathing.  Stat repeat head CT does show enlargement of multiple brain contusions.  NSGY aware and will see patient later today.  PCCM consulted to manage.  Trauma scan neg.  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   GERD (gastroesophageal reflux disease)    on meds   Hiatal hernia    Hx of adenomatous polyp of colon 07/07/2009   Hyperlipidemia    diet controlled   Hypertension    on meds - LASIX    IBS (irritable bowel syndrome)    Iron  deficiency anemia    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 Repeat CT Scan ordered   Interim History / Subjective:  Intubated, sedated No distress Hypoglycemic episode this morning- CBG 47, corrected now 141  ET tube right main stem, pulled back-now in adequate position  CT of head ordered   Objective    Blood pressure 115/74, pulse 84, temperature 99 F (37.2 C), temperature source Axillary, resp. rate 15, height 5\' 4"  (1.626 m), weight 55 kg, SpO2 97%.    Vent Mode: PRVC FiO2 (%):  [40 %-100 %] 40 % Set Rate:  [24 bmp] 24 bmp Vt Set:  [430 mL] 430 mL PEEP:  [5 cmH20] 5 cmH20 Plateau Pressure:  [14 cmH20] 14 cmH20   Intake/Output Summary (Last 24 hours) at 10/28/2023 0909 Last data filed at 10/28/2023 0800 Gross per 24 hour  Intake 1627.45 ml  Output 1245 ml  Net 382.45 ml   Filed Weights   10/26/23 2200  Weight: 55 kg    Examination: General: acute on chronic older adult female, lying in icu bed on vent- no distress  HEENT: Normocephalic, PERRLA intact, ETT, OG, Pink MM CV: s1,s2, RRR-sinus tach, no MRG, No JVD  pulm: clear, diminished, no distress on vent  Abs: bs active, soft  Extremities: no edema, no deformity, purposeful movements reported but not following commands this AM  Skin: no rash, but  multiple bruises generalized  Neuro: Rass -2, responds to painful stimuli, cough gag reflex present  GU: foley intact  Resolved problem list   Assessment and Plan  Acute encephalopathy, inability to protect airway- in context of worsening TBI, r/o seizure, no herniation on CT. 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 SVT, hypo-phos, hypo-K-  Hypoglycemia  Sinus Tach on this morning assessment on 5/16  DIC panel: no schistocytes, PT 15.7, Fibrinogen 397, D dimer 5.7, plts 157  EEG suggestive of cortical dysfunction arising from right hemisphere, moderate diffuse encephalopathy  No seizures  P:  Continue ventilator  support and lung protective strategies  Continue LTVV 6-8cc/kg  Wean PEEP and Fio2 requirements to sat goal of >92%  HOB > 30 degrees Plat pressures < 30  Intermittent Chest X-ray and ABGS VAP and PAD protocols in place- fentanyl and propofol , wean as tolerated  Repeat CT scan of head, f/u with, neurosurgery following/appreciate assitance   Will place on Tube feedings to assist to prevent hypoglycemia  Continue hydrocortisone 100mg  daily for now  Continue CBG q4, monitor for further hypoglycemic episodes Continue neuroprotective measures   Best Practice (right click and "Reselect all SmartList Selections" daily)   Diet/type: NPO, start tube feeds 5/16  DVT prophylaxis SCD Pressure ulcer(s): N/A GI prophylaxis: PPI Lines: Central line Foley:  Yes, and it is still needed Code Status:  full code Last date of multidisciplinary goals of care discussion [updated husband] 5/16   Critical care time: 34 mins independent of procedures     Rey Catholic AGACNP-BC   [facility] 10/28/2023, 9:28 AM  Please see [email or web address] for pager details.  From 7A-7P if no response, please call [phone]. After hours, please call ELink [phone].

## 2023-10-28 NOTE — Inpatient Diabetes Management (Signed)
 Inpatient Diabetes Program Recommendations  AACE/ADA: New Consensus Statement on Inpatient Glycemic Control (2015)  Target Ranges:  Prepandial:   less than 140 mg/dL      Peak postprandial:   less than 180 mg/dL (1-2 hours)      Critically ill patients:  140 - 180 mg/dL   Lab Results  Component Value Date   GLUCAP 123 (H) 10/28/2023   HGBA1C 6.8 (H) 10/27/2023    Review of Glycemic Control  Latest Reference Range & Units 10/27/23 13:57 10/27/23 15:45 10/27/23 19:47 10/27/23 23:28 10/28/23 03:39 10/28/23 07:15 10/28/23 07:43 10/28/23 11:00  Glucose-Capillary 70 - 99 mg/dL 253 (H)  Novolog 11 units 116 (H) 251 (H)  Novolog 11 units 209 (H)  Novolog 7 units given at 0100 (1.5 hours after CBG obtained) 146 (H)  Novolog 3 units given at 0424 47 (L) 141 (H) 123 (H)   Diabetes history: DM 2 Outpatient Diabetes medications: Humalog  18-22 units at lunch, Rybelsus 14 mg Daily Current orders for Inpatient glycemic control:  Novolog 0-20 units Q4 hours Solucortef 100 mg Daily  Note Hypoglycemia possibly due to late administration time of insulin  and insulin  stacking. Watch for now on current regimen. If glucose trends drop again would reduce correction scale and maybe do very low dose basal insulin  to even out glucose trends.  Renal function WNL  Thanks,  Eloise Hake RN, MSN, BC-ADM Inpatient Diabetes Coordinator Team Pager [phone] (8a-5p)

## 2023-10-28 NOTE — Consult Note (Signed)
 Palliative Care Consult Note                                  Date: 10/28/2023   Patient Name: Miranda Cochran  DOB: 1950/12/16  MRN: [id number]  Age / Sex: 73 y.o., female  PCP: Miranda Furth, MD Referring Physician: Mannam, Praveen, MD  Reason for Consultation: Establishing goals of care  HPI/Patient Profile: 73 y.o. female  with past medical history of autoimmune hepatitis with cirrhosis of liver, hypertension, depression, GERD, hyperlipidemia admitted on 10/26/2023 after falling and hitting her head.  In ED CT showed subdural hematoma, subarachnoid hemorrhage, and occipital skull fracture. She is intubated.   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   GERD (gastroesophageal reflux disease)    on meds   Hiatal hernia    Hx of adenomatous polyp of colon 07/07/2009   Hyperlipidemia    diet controlled   Hypertension    on meds - LASIX    IBS (irritable bowel syndrome)    Iron  deficiency anemia    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  I introduced Palliative Medicine as specialized medical care for people living with serious illness. It focuses on providing relief from symptoms and stress of a serious illness. The goal is to improve quality of life for both the patient and the family.  Today's Discussion: Patient's husband Miranda Cochran shared his understanding of the patient's chronic conditions and acute hospitalization including her skull fracture and bleeds.  He shared that the plan is to potentially try to extubate patient in the next days depending on her mental status and CT scan. He is encouraged that patient had been following commands.   Patient lived at home with her husband before this hospitalization. They have been married two years. The patient does not have children but enjoys spending time with her husbands children and grandchildren. She has a niece that works at [employer]. Miranda Cochran shared that they both work at  [employer]. The patient works as a Teacher, music. She enjoys going out to eat and animals. He shared that she has been independent. Her balance had been "off" recently and she was having recurrent falls.  A discussion was had today regarding advanced directives. Miranda Cochran shares that the patient did not have any advanced directives. They had not discussed goals of care or code status. He shared he hopes she will be able to complete advanced directives in the future. Concepts specific to code status and scopes of care were discussed. At this time Miranda Cochran would like to keep the patient full code and full scope of care allowing time for outcomes.  Discussed the importance of continued conversation with family and the medical providers regarding overall plan of care and treatment options, ensuring decisions are  within the context of the patient's values and GOCs.  Questions and concerns were addressed. Hard Choices booklet left for review. The family was encouraged to call with questions or concerns. PMT will continue to support holistically.  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  Constitutional:      General: She is not in acute distress.    Appearance: She is ill-appearing.     Interventions: She is sedated and intubated.  Cardiovascular:     Rate and Rhythm: Normal rate.  Pulmonary:     Effort: She is intubated.     Vital Signs:  BP (!) 108/57   Pulse 83   Temp 97.6 F (36.4 C) (Axillary)   Resp (!) 22   Ht 5\' 4"  (1.626 m)   Wt 55 kg   SpO2 94%   BMI 20.81 kg/m   Palliative Assessment/Data: 30%    Advanced Care Planning:   Existing Vynca/ACP Documentation: None  Primary Decision Maker: NEXT OF KIN. Patient's husband Miranda Cochran is the primary decision maker.  Code Status/Advance Care Planning: Full code   Assessment & Plan:   SUMMARY OF RECOMMENDATIONS   Full code Full scope Encouraged patient's husband to continue considering GOC  Time for outcomes Spiritual care consult for prayer Continued PMT support   Discussed with: bedside RN and CCM NP  Time Total: 75 minutes    Thank you for allowing us  to participate in the care of Miranda Cochran PMT will continue to support holistically.   Signed by: Miranda Muir, NP Palliative Medicine Team  Team Phone # [phone] (Nights/Weekends)  10/28/2023, 5:35 PM

## 2023-10-28 NOTE — Progress Notes (Signed)
 LTM maint complete - no skin breakdown under:  Cz,Fp1.

## 2023-10-28 NOTE — Plan of Care (Signed)
     Referral received for [facility]: goals of care discussion. Chart reviewed and updates received from RN. Patient assessed and is unable to engage appropriately in discussions. Attempted to contact patient's husband Rateel Pline. Unable to reach. Voicemail left with contact information given.   PMT will re-attempt to contact family at a later time/date. Detailed note and recommendations to follow once GOC has been completed.   Thank you for your referral and allowing PMT to assist in Miranda Cochran's care.   Joaquim Muir, NP Palliative Medicine Team  Team Phone # [phone]   NO CHARGE

## 2023-10-28 NOTE — Progress Notes (Signed)
 NEUROLOGY CONSULT NOTE   Date of service: Oct 28, 2023 Patient Name: Miranda Cochran MRN:  [id number] DOB:  06/17/1950 Chief Complaint: "seizure " Requesting Provider: Josiah Nigh, MD  Subjective  Seen and examined.  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   GERD (gastroesophageal reflux disease)    on meds   Hiatal hernia    Hx of adenomatous polyp of colon 07/07/2009   Hyperlipidemia    diet controlled   Hypertension    on meds - LASIX    IBS (irritable bowel syndrome)    Iron  deficiency anemia    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 Willy Harvest, adenimatous polyps, repeat in 5 yrs   ESOPHAGOGASTRODUODENOSCOPY  07/06/2021   per Dr. Willy Harvest, showed portal hypertensive gastropathy, repeat in 2 yrs   ESOPHAGOGASTRODUODENOSCOPY (EGD) WITH PROPOFOL  N/A 06/20/2017   Procedure: ESOPHAGOGASTRODUODENOSCOPY (EGD) WITH PROPOFOL ;  Surgeon: Kenney Peacemaker, MD;  Location: [facility];  Service: Endoscopy;  Laterality: N/A;  with Barrx   EYE SURGERY     NASAL SEPTOPLASTY W/ TURBINOPLASTY     POLYPECTOMY   2015   CG-TA   TONSILLECTOMY      Family History: Family History  Problem Relation Age of Onset   Stroke Mother    Hypertension Mother    Colon cancer Father 58   Liver cancer Father 68   Colon polyps Father 106   Heart disease Sister        hole in heart   Pancreatic cancer Maternal Aunt    Brain cancer Maternal Uncle    Stroke Maternal Grandmother    Hypertension Maternal Grandmother    Esophageal cancer Maternal Grandfather    Throat cancer Maternal Grandfather    Breast cancer Paternal Grandmother    Brain cancer Paternal Grandmother    Lung cancer Paternal Grandfather    Rectal cancer Neg Hx    Stomach cancer Neg Hx     Social History  reports that she has never smoked. She has never used smokeless tobacco. She reports current alcohol use. She reports that she does not use drugs.  Allergies  Allergen Reactions   Fentanyl Anxiety    Made her feel very anxious, requests not to get it.   Levofloxacin Anxiety    Weird dreams    Amoxicillin  Diarrhea    Has patient had a PCN reaction causing immediate rash, facial/tongue/throat swelling, SOB or lightheadedness with hypotension: No Has  patient had a PCN reaction causing severe rash involving mucus membranes or skin necrosis: No Has patient had a PCN reaction that required hospitalization: No Has patient had a PCN reaction occurring within the last 10 years: Yes If all of the above answers are "NO", then may proceed with Cephalosporin use.    Augmentin  [Amoxicillin -Pot Clavulanate] Other (See Comments)    diarrhea   Farxiga  [Dapagliflozin ] Diarrhea    Gi pain   Statins     Myalgia     Medications   Current Facility-Administered Medications:    Chlorhexidine Gluconate Cloth 2 % PADS 6 each, 6 each, Topical, Q0600, Josiah Nigh, MD, 6 each at 10/27/23 1234   Chlorhexidine Gluconate Cloth 2 % PADS 6 each, 6 each, Topical, Daily, Josiah Nigh, MD, 6 each at 10/27/23 1234   cyanocobalamin (VITAMIN B12) tablet  500 mcg, 500 mcg, Oral, Daily, Kakrakandy, Arshad N, MD   ezetimibe  (ZETIA ) tablet 10 mg, 10 mg, Oral, Daily, Kakrakandy, Arshad N, MD   fentaNYL (SUBLIMAZE) injection 25 mcg, 25 mcg, Intravenous, Q15 min PRN, Josiah Nigh, MD, 25 mcg at 10/27/23 1430   fentaNYL (SUBLIMAZE) injection 25-100 mcg, 25-100 mcg, Intravenous, Q30 min PRN, Josiah Nigh, MD, 50 mcg at 10/27/23 1233   fentaNYL (SUBLIMAZE) injection 50 mcg, 50 mcg, Intravenous, Once, Josiah Nigh, MD   fentaNYL in NS 250mL (55mcg/ml) infusion-PREMIX, 0-400 mcg/hr, Intravenous, Continuous, Josiah Nigh, MD, Last Rate: 7.5 mL/hr at 10/28/23 0702, 75 mcg/hr at 10/28/23 1610   hydrALAZINE (APRESOLINE) injection 10 mg, 10 mg, Intravenous, Q4H PRN, Akula, Vijaya, MD, 10 mg at 10/27/23 1201   hydrocortisone sodium succinate (SOLU-CORTEF) 100 MG injection 100 mg, 100 mg, Intravenous, Daily, Josiah Nigh, MD, 100 mg at 10/27/23 1243   insulin  aspart (novoLOG) injection 0-20 Units, 0-20 Units, Subcutaneous, Q4H, Josiah Nigh, MD, 3 Units at 10/28/23 0424   levETIRAcetam (KEPPRA) undiluted injection 1,000 mg, 1,000 mg, Intravenous, Q12H, Josiah Nigh, MD, 1,000 mg at 10/27/23 2209   midazolam  (VERSED ) injection 2 mg, 2 mg, Intravenous, Once, Josiah Nigh, MD   norepinephrine (LEVOPHED) 4mg  in (0.016 mg/mL) premix infusion, 0-40 mcg/min, Intravenous, Titrated, Josiah Nigh, MD, Held at 10/27/23 1243   Oral care mouth rinse, 15 mL, Mouth Rinse, Q2H, Josiah Nigh, MD   Oral care mouth rinse, 15 mL, Mouth Rinse, PRN, Josiah Nigh, MD   pantoprazole (PROTONIX) injection 40 mg, 40 mg, Intravenous, QHS, Josiah Nigh, MD, 40 mg at 10/27/23 2209   phenylephrine (NEO-SYNEPHRINE) 20mg /NS 250mL premix infusion, 0-400 mcg/min, Intravenous, Titrated, Josiah Nigh, MD, Held at 10/27/23 1243   potassium chloride  10 mEq in 50 mL *CENTRAL LINE* IVPB, 10 mEq, Intravenous, Q1H, Ogan, Okoronkwo U, MD, Last Rate: 50 mL/hr  at 10/28/23 0757, 10 mEq at 10/28/23 0757   propofol  (DIPRIVAN ) 1000 MG/100ML infusion, 0-50 mcg/kg/min, Intravenous, Continuous, Josiah Nigh, MD, Last Rate: 16.5 mL/hr at 10/28/23 0702, 50 mcg/kg/min at 10/28/23 9604   sodium phosphate 15 mmol in sodium chloride  0.9 % 250 mL infusion, 15 mmol, Intravenous, Once, Trinidad Funk, RPH  Vitals   Vitals:   10/28/23 0400 10/28/23 0700 10/28/23 0759 10/28/23 0800  BP:  (!) 109/58  115/74  Pulse:  77  84  Resp:  (!) 24  15  Temp: 99.3 F (37.4 C)  99 F (37.2 C)   TempSrc: Axillary  Axillary   SpO2:  98%  97%  Weight:  Height:        Body mass index is 20.81 kg/m.  Physical Exam   Constitutional: Appears ill, Eyes: No scleral injection.  HENT: Currently intubated Head: Large scalp hematoma, with dressing in place. Cardiovascular: Tachycardic  Neurologic Examination  Limited exam, after holding sedation  Neurological:  Propofol  held for sedation Awakens to voice Does not tend to the left as much as she tends to the observer on the right Pupils are pinpoint-likely due to fentanyl bilaterally Possibly right gaze preference Does not bring to threat from the left Is able to give a thumbs up on the right.  Left upper extremity is barely antigravity Right lower extremity is at least antigravity, left lower extremity is not antigravity and may be 2/5.   Labs/Imaging/Neurodiagnostic studies   CBC:  Recent Labs  Lab 2023-11-01 0504 Nov 01, 2023 1236 01-Nov-2023 2204 10/28/23 0443  WBC 14.7*  --   --  11.8*  HGB 12.3 10.2*  --  9.7*  HCT 36.9 30.0*  --  28.6*  MCV 96.6  --   --  98.3  PLT 177  --  157 138*   Basic Metabolic Panel:  Lab Results  Component Value Date   NA 137 10/28/2023   K 2.8 (L) 10/28/2023   CO2 22 10/28/2023   GLUCOSE 112 (H) 10/28/2023   BUN 7 (L) 10/28/2023   CREATININE 0.46 10/28/2023   CALCIUM  7.7 (L) 10/28/2023   GFRNONAA >60 10/28/2023   GFRAA >89 11/01/2015   Lipid Panel:  Lab Results   Component Value Date   LDLCALC 113 (H) 02/25/2022   HgbA1c:  Lab Results  Component Value Date   HGBA1C 6.8 (H) Nov 01, 2023   Urine Drug Screen:  INR  Lab Results  Component Value Date   INR 1.2 11-01-23   APTT  Lab Results  Component Value Date   APTT 22 (L) 11-01-2023   CT Head without contrast(Personally reviewed): 5/14 IMPRESSION: 1. Mixed subdural and subarachnoid hemorrhage of the right hemisphere, predominantly anterior. No midline shift or other mass effect. 2. Nondisplaced left occipital skull fracture with large left posterior scalp hematoma. 3. No acute fracture or static subluxation of the cervical spine.  11-01-23 1. Interval increase in size of an acute subdural hematoma along the right cerebral convexity (greatest along the anterior right frontal lobe), now measuring up to 9 mm in thickness (previously 7 mm). 2. Acute subarachnoid hemorrhage scattered along the right cerebral convexity, increased. 3. Hemorrhagic parenchymal contusions within the anterior right temporal lobe and anteroinferior right frontal lobe have increased in size. 4. Trace acute subarachnoid hemorrhage along the anterolateral left temporal lobe, new from the prior exam. Possible small underlying left temporal lobe hemorrhagic parenchymal contusions. 5. A 2.3 x 2.6 cm hemorrhagic parenchymal contusion within the left cerebellar hemisphere has increased in size (previously 2.1 x 1.8 cm). 6. Thin subdural hemorrhage layering along the tentorium, bilaterally, similar to the prior exam. 7. Trace subarachnoid hemorrhage (versus prominent choroid plexus) within the inferior aspect of the fourth ventricle. 8. Non-depressed fracture of the left occipital calvarium. 9. Left occipital scalp hematoma and laceration.  Nov 01, 2023 Follow-up/Time 1. An acute subdural hematoma along the right cerebral convexity has undergone some interval redistribution but has not significantly changed in overall  extent. 2. Continued slight interval increase in extent of acute subarachnoid hemorrhage scattered along the right cerebral convexity. 3. Progressive edema at sites of hemorrhagic parenchymal contusions within the anteroinferior right frontal lobe and anterior right temporal lobe. 4. Small-volume subarachnoid hemorrhage  scattered along the left cerebral convexity has mildly increased. Possible small underlying hemorrhagic parenchymal contusions within the anterolateral left temporal lobe, as before. 5. Thin acute subdural hemorrhage layering along the tentorium bilaterally, unchanged. 6. 2.3 x 2.6 cm acute parenchymal hemorrhage within the left cerebellar hemisphere, unchanged. 7. Trace subarachnoid hemorrhage again questioned at the inferior aspect of the fourth ventricle. 8. No midline shift or hydrocephalus. 9. Known non-depressed fracture of the left occipital calvarium.  5/16 1. Extensive multifocal posttraumatic intracranial hemorrhage, stable since yesterday with EXCEPT FOR: Small 4 mm mixed but predominantly low-density left Subdural Hematoma has increased (coronal image 46).   2. No midline shift or significant intracranial mass effect despite the findings. No ventriculomegaly. Stable basilar cisterns.   3. Nondisplaced left posterior convexity skull fracture with large  CT C-spine (5/14)  1. Mixed subdural and subarachnoid hemorrhage of the right hemisphere, predominantly anterior. No midline shift or other mass effect. 2. Nondisplaced left occipital skull fracture with large left posterior scalp hematoma. 3. No acute fracture or static subluxation of the cervical spine.   Neurodiagnostics cEEG 10/27/2023 1319 to 10/28/2023 1610  Description: EEG showed continuous generalized and lateralized right hemisphere 3 to 6 Hz theta-delta slowing with overriding 12-14hz  beta activity. Hyperventilation and photic stimulation were not performed.      ABNORMALITY - Continuous  slow, generalized and lateralized right hemisphere   IMPRESSION: This study is suggestive of cortical dysfunction arising from right hemisphere likely secondary to underlying structural abnormality. Additionally there is moderate to severe diffuse encephalopathy likely related to sedation. No seizures or epileptiform discharges were seen throughout the recording  ASSESSMENT   Miranda Cochran is a 73 y.o. female with hx of autoimmune hepatitis, cirrhosis of the liver, hypertension, depression, GERD, and hyperlipidemia. Patient arrived on 5/14 via EMS for evaluation after a fall and hit her head yesterday.    Imaging concerning for occipital fracture with acute subarachnoid hemorrhage, multiple contusions and a right subdural hematoma. Most recent CT demonstrates extensive multifocal posttraumatic intracranial hemorrhage, stable since yesterday with EXCEPT FOR: Small 4 mm mixed but predominantly low-density left Subdural Hematoma has increased. EEG negative for seizure overnight, will continue for now. Patient able to variably follow commands once weaned off sedation for physical exam.   Impression: Suspect seizure like activity in the setting of increasing subdural hematoma, TBI  RECOMMENDATIONS  -- Repeat CT Head w/o contrast tomorrow at 0800 AM  -- Low threshold to repeat head CT if acute or worsening neurological function -- Recommend neuro checks every 2 hours   -- continue LTM EEG  - Continue Keppra 1,000 mg every 12 hours -- Neurology following -- Neurosurgery following as well, appreciate there assistance   Plan Discussed with NP Felipe Horton and CCM team  __________________________________________________________________  Signed, Brayton Calin, MD [facility] psychiatry resident-PGY 1  Attending Neurohospitalist Addendum Patient seen and examined with APP/Resident. Agree with the history and physical as documented above. Agree with the plan as documented, which I helped formulate. I  have independently reviewed the chart, obtained history, review of systems and examined the patient.I have personally reviewed pertinent head/neck/spine imaging (CT/MRI). Please feel free to call with any questions.  -- Tona Francis, MD Neurologist [facility] Pager: [phone]  CRITICAL CARE ATTESTATION Performed by: Tona Francis, MD Total critical care time: 35 minutes Critical care time was exclusive of separately billable procedures and treating other patients and/or supervising APPs/Residents/Students Critical care was necessary to treat or prevent imminent or life-threatening deterioration. This patient is  critically ill and at significant risk for neurological worsening and/or death and care requires constant monitoring. Critical care was time spent personally by me on the following activities: development of treatment plan with patient and/or surrogate as well as nursing, discussions with consultants, evaluation of patient's response to treatment, examination of patient, obtaining history from patient or surrogate, ordering and performing treatments and interventions, ordering and review of laboratory studies, ordering and review of radiographic studies, pulse oximetry, re-evaluation of patient's condition, participation in multidisciplinary rounds and medical decision making of high complexity in the care of this patient.

## 2023-10-28 NOTE — Progress Notes (Signed)
 Patient transported to CT and back to 4N24 without complication.

## 2023-10-28 NOTE — Progress Notes (Signed)
 eLink Physician-Brief Progress Note Patient Name: Miranda Cochran DOB: 11/05/1950 MRN: [id number]   Date of Service  10/28/2023  HPI/Events of Note  Patient is intubated and needs a restraints order to prevent self-extubation.  eICU Interventions  Restraints order entered.        Kile Kabler U Laura-Lee Villegas 10/28/2023, 12:58 AM

## 2023-10-28 NOTE — Progress Notes (Signed)
 LTM maint complete - no skin breakdown seen. [facility] monitored, Event button test confirmed by [facility].

## 2023-10-28 NOTE — Progress Notes (Signed)
 Patient ID: VERLYN DETAR, female   DOB: November 22, 1950, 73 y.o.   MRN: [id number] And patient is resting well on ventilator.  She had been following commands overnight but she is now purposeful but not following commands.  CT scan had to be reordered because patient had drop in blood sugar.  This will be done this morning.  I will check it later.  Continue supportive care

## 2023-10-28 NOTE — Progress Notes (Signed)
 Transition of Care [facility]) - CAGE-AID Screening   Patient Details  Name: Miranda Cochran MRN: [id number] Date of Birth: 02-28-1951     Asa Bjork, RN Trauma Response Nurse Phone Number: [phone] 10/28/2023, 5:54 PM   CAGE-AID Screening: Substance Abuse Screening unable to be completed due to: : (S) Patient unable to participate (intubated in ICU)

## 2023-10-29 ENCOUNTER — Inpatient Hospital Stay (HOSPITAL_COMMUNITY)

## 2023-10-29 DIAGNOSIS — E876 Hypokalemia: Secondary | ICD-10-CM | POA: Diagnosis not present

## 2023-10-29 DIAGNOSIS — S066XAA Traumatic subarachnoid hemorrhage with loss of consciousness status unknown, initial encounter: Secondary | ICD-10-CM

## 2023-10-29 DIAGNOSIS — Z515 Encounter for palliative care: Secondary | ICD-10-CM | POA: Diagnosis not present

## 2023-10-29 DIAGNOSIS — G40901 Epilepsy, unspecified, not intractable, with status epilepticus: Secondary | ICD-10-CM | POA: Diagnosis not present

## 2023-10-29 DIAGNOSIS — R579 Shock, unspecified: Secondary | ICD-10-CM

## 2023-10-29 DIAGNOSIS — J189 Pneumonia, unspecified organism: Secondary | ICD-10-CM

## 2023-10-29 DIAGNOSIS — G9340 Encephalopathy, unspecified: Secondary | ICD-10-CM | POA: Diagnosis not present

## 2023-10-29 DIAGNOSIS — J9601 Acute respiratory failure with hypoxia: Secondary | ICD-10-CM

## 2023-10-29 DIAGNOSIS — S065XAA Traumatic subdural hemorrhage with loss of consciousness status unknown, initial encounter: Secondary | ICD-10-CM | POA: Diagnosis not present

## 2023-10-29 DIAGNOSIS — R569 Unspecified convulsions: Secondary | ICD-10-CM | POA: Diagnosis not present

## 2023-10-29 DIAGNOSIS — Z7189 Other specified counseling: Secondary | ICD-10-CM | POA: Diagnosis not present

## 2023-10-29 LAB — COMPREHENSIVE METABOLIC PANEL WITH GFR
ALT: 14 U/L (ref 0–44)
AST: 24 U/L (ref 15–41)
Albumin: 2.1 g/dL — ABNORMAL LOW (ref 3.5–5.0)
Alkaline Phosphatase: 68 U/L (ref 38–126)
Anion gap: 9 (ref 5–15)
BUN: 14 mg/dL (ref 8–23)
CO2: 19 mmol/L — ABNORMAL LOW (ref 22–32)
Calcium: 6.5 mg/dL — ABNORMAL LOW (ref 8.9–10.3)
Chloride: 107 mmol/L (ref 98–111)
Creatinine, Ser: 0.64 mg/dL (ref 0.44–1.00)
GFR, Estimated: >60 mL/min (ref >60)
Glucose, Bld: 247 mg/dL — ABNORMAL HIGH (ref 70–99)
Potassium: 3.2 mmol/L — ABNORMAL LOW (ref 3.5–5.1)
Sodium: 135 mmol/L (ref 135–145)
Total Bilirubin: 1.5 mg/dL — ABNORMAL HIGH (ref 0.0–1.2)
Total Protein: 4.9 g/dL — ABNORMAL LOW (ref 6.5–8.1)

## 2023-10-29 LAB — MAGNESIUM: Magnesium: 1.9 mg/dL (ref 1.7–2.4)

## 2023-10-29 LAB — POCT I-STAT 7, (LYTES, BLD GAS, ICA,H+H)
Acid-base deficit: 5 mmol/L — ABNORMAL HIGH (ref 0.0–2.0)
Bicarbonate: 18.5 mmol/L — ABNORMAL LOW (ref 20.0–28.0)
Calcium, Ion: 1.08 mmol/L — ABNORMAL LOW (ref 1.15–1.40)
HCT: 29 % — ABNORMAL LOW (ref 36.0–46.0)
Hemoglobin: 9.9 g/dL — ABNORMAL LOW (ref 12.0–15.0)
O2 Saturation: 100 %
Patient temperature: 99.5
Potassium: 3.4 mmol/L — ABNORMAL LOW (ref 3.5–5.1)
Sodium: 134 mmol/L — ABNORMAL LOW (ref 135–145)
TCO2: 19 mmol/L — ABNORMAL LOW (ref 22–32)
pCO2 arterial: 29.1 mmHg — ABNORMAL LOW (ref 32–48)
pH, Arterial: 7.413 (ref 7.35–7.45)
pO2, Arterial: 173 mmHg — ABNORMAL HIGH (ref 83–108)

## 2023-10-29 LAB — GLUCOSE, CAPILLARY
Glucose-Capillary: 191 mg/dL — ABNORMAL HIGH (ref 70–99)
Glucose-Capillary: 197 mg/dL — ABNORMAL HIGH (ref 70–99)
Glucose-Capillary: 275 mg/dL — ABNORMAL HIGH (ref 70–99)
Glucose-Capillary: 335 mg/dL — ABNORMAL HIGH (ref 70–99)
Glucose-Capillary: 339 mg/dL — ABNORMAL HIGH (ref 70–99)
Glucose-Capillary: 341 mg/dL — ABNORMAL HIGH (ref 70–99)
Glucose-Capillary: 379 mg/dL — ABNORMAL HIGH (ref 70–99)

## 2023-10-29 LAB — CBC
HCT: 29.3 % — ABNORMAL LOW (ref 36.0–46.0)
Hemoglobin: 9.2 g/dL — ABNORMAL LOW (ref 12.0–15.0)
MCH: 32.3 pg (ref 26.0–34.0)
MCHC: 31.4 g/dL (ref 30.0–36.0)
MCV: 102.8 fL — ABNORMAL HIGH (ref 80.0–100.0)
Platelets: 182 10*3/uL (ref 150–400)
RBC: 2.85 MIL/uL — ABNORMAL LOW (ref 3.87–5.11)
RDW: 15 % (ref 11.5–15.5)
WBC: 15.5 10*3/uL — ABNORMAL HIGH (ref 4.0–10.5)
nRBC: 0 % (ref 0.0–0.2)

## 2023-10-29 LAB — LACTIC ACID, PLASMA: Lactic Acid, Venous: 3 mmol/L (ref 0.5–1.9)

## 2023-10-29 LAB — PHENOBARBITAL LEVEL: Phenobarbital: 23.8 ug/mL (ref 15.0–40.0)

## 2023-10-29 LAB — PHOSPHORUS: Phosphorus: 1.6 mg/dL — ABNORMAL LOW (ref 2.5–4.6)

## 2023-10-29 LAB — PROCALCITONIN: Procalcitonin: 2.66 ng/mL

## 2023-10-29 MED ORDER — SODIUM CHLORIDE 0.9 % IV SOLN
1100.0000 mg | Freq: Once | INTRAVENOUS | Status: AC
  Administered 2023-10-29: 1100 mg via INTRAVENOUS
  Filled 2023-10-29: qty 4.46

## 2023-10-29 MED ORDER — MIDAZOLAM HCL 2 MG/2ML IJ SOLN: 2.0000 mg | Freq: Once | INTRAMUSCULAR | Status: AC

## 2023-10-29 MED ORDER — LEVETIRACETAM 500 MG/5ML IV SOLN
INTRAVENOUS | Status: AC
  Filled 2023-10-29: qty 10

## 2023-10-29 MED ORDER — CHLORHEXIDINE GLUCONATE CLOTH 2 % EX PADS
6.0000 | MEDICATED_PAD | Freq: Every day | CUTANEOUS | Status: DC
  Administered 2023-10-29 – 2023-11-05 (×8): 6 via TOPICAL

## 2023-10-29 MED ORDER — EZETIMIBE 10 MG PO TABS
10.0000 mg | ORAL_TABLET | Freq: Every day | ORAL | Status: AC
Start: 2023-10-29 — End: ?
  Administered 2023-10-29 – 2023-11-11 (×14): 10 mg
  Filled 2023-10-29 (×14): qty 1

## 2023-10-29 MED ORDER — INSULIN GLARGINE-YFGN 100 UNIT/ML ~~LOC~~ SOLN
12.0000 [IU] | Freq: Every day | SUBCUTANEOUS | Status: DC
  Administered 2023-10-29 – 2023-11-01 (×4): 12 [IU] via SUBCUTANEOUS
  Filled 2023-10-29 (×4): qty 0.12

## 2023-10-29 MED ORDER — VASOPRESSIN 20 UNITS/100 ML INFUSION FOR SHOCK
0.0000 [IU]/min | INTRAVENOUS | Status: DC
  Administered 2023-10-29 – 2023-10-30 (×3): 0.03 [IU]/min via INTRAVENOUS
  Filled 2023-10-29 (×3): qty 100

## 2023-10-29 MED ORDER — VITAMIN B-12 1000 MCG PO TABS
500.0000 ug | ORAL_TABLET | Freq: Every day | ORAL | Status: DC
  Administered 2023-10-29 – 2023-11-11 (×14): 500 ug
  Filled 2023-10-29 (×14): qty 1

## 2023-10-29 MED ORDER — LEVETIRACETAM (KEPPRA) 500 MG/5 ML ADULT IV PUSH
1500.0000 mg | Freq: Two times a day (BID) | INTRAVENOUS | Status: DC
  Administered 2023-10-29 – 2023-10-30 (×4): 1500 mg via INTRAVENOUS
  Filled 2023-10-29 (×4): qty 15

## 2023-10-29 MED ORDER — ACETAMINOPHEN 160 MG/5ML PO SOLN
650.0000 mg | ORAL | Status: AC | PRN
  Administered 2023-10-29 – 2023-10-31 (×3): 650 mg
  Filled 2023-10-29 (×3): qty 20.3

## 2023-10-29 MED ORDER — LEVETIRACETAM (KEPPRA) 500 MG/5 ML ADULT IV PUSH
1000.0000 mg | Freq: Once | INTRAVENOUS | Status: AC
  Administered 2023-10-29: 1000 mg via INTRAVENOUS

## 2023-10-29 MED ORDER — SODIUM CHLORIDE 0.9 % IV SOLN
3.0000 g | Freq: Four times a day (QID) | INTRAVENOUS | Status: DC
  Administered 2023-10-29 – 2023-11-01 (×13): 3 g via INTRAVENOUS
  Filled 2023-10-29 (×13): qty 8

## 2023-10-29 MED ORDER — MAGNESIUM SULFATE 2 GM/50ML IV SOLN
2.0000 g | Freq: Once | INTRAVENOUS | Status: AC
  Administered 2023-10-29: 2 g via INTRAVENOUS
  Filled 2023-10-29: qty 50

## 2023-10-29 MED ORDER — MIDAZOLAM HCL 2 MG/2ML IJ SOLN
INTRAMUSCULAR | Status: AC
  Administered 2023-10-29: 2 mg
  Filled 2023-10-29: qty 2

## 2023-10-29 MED ORDER — POTASSIUM PHOSPHATES 15 MMOLE/5ML IV SOLN
45.0000 mmol | Freq: Once | INTRAVENOUS | Status: AC
  Administered 2023-10-29: 45 mmol via INTRAVENOUS
  Filled 2023-10-29: qty 15

## 2023-10-29 MED ORDER — LACTATED RINGERS IV BOLUS
1000.0000 mL | Freq: Once | INTRAVENOUS | Status: AC
  Administered 2023-10-29: 1000 mL via INTRAVENOUS

## 2023-10-29 NOTE — Plan of Care (Signed)
  Problem: Fluid Volume: Goal: Ability to maintain a balanced intake and output will improve Outcome: Progressing   Problem: Health Behavior/Discharge Planning: Goal: Ability to manage health-related needs will improve Outcome: Progressing   Problem: Nutritional: Goal: Maintenance of adequate nutrition will improve Outcome: Progressing Goal: Progress toward achieving an optimal weight will improve Outcome: Progressing   Problem: Skin Integrity: Goal: Risk for impaired skin integrity will decrease Outcome: Progressing   Problem: Tissue Perfusion: Goal: Adequacy of tissue perfusion will improve Outcome: Progressing   Problem: Education: Goal: Knowledge of General Education information will improve Description: Including pain rating scale, medication(s)/side effects and non-pharmacologic comfort measures Outcome: Progressing   Problem: Health Behavior/Discharge Planning: Goal: Ability to manage health-related needs will improve Outcome: Progressing   Problem: Clinical Measurements: Goal: Ability to maintain clinical measurements within normal limits will improve Outcome: Progressing Goal: Will remain free from infection Outcome: Progressing Goal: Diagnostic test results will improve Outcome: Progressing Goal: Respiratory complications will improve Outcome: Progressing Goal: Cardiovascular complication will be avoided Outcome: Progressing   Problem: Activity: Goal: Risk for activity intolerance will decrease Outcome: Progressing   Problem: Nutrition: Goal: Adequate nutrition will be maintained Outcome: Progressing   Problem: Coping: Goal: Level of anxiety will decrease Outcome: Progressing   Problem: Elimination: Goal: Will not experience complications related to urinary retention Outcome: Progressing   Problem: Pain Managment: Goal: General experience of comfort will improve and/or be controlled Outcome: Progressing   Problem: Safety: Goal: Ability to  remain free from injury will improve Outcome: Progressing   Problem: Skin Integrity: Goal: Risk for impaired skin integrity will decrease Outcome: Progressing   Problem: Safety: Goal: Non-violent Restraint(s) Outcome: Progressing   Problem: Education: Goal: Ability to describe self-care measures that may prevent or decrease complications (Diabetes Survival Skills Education) will improve Outcome: Not Progressing Goal: Individualized Educational Video(s) Outcome: Not Progressing   Problem: Coping: Goal: Ability to adjust to condition or change in health will improve Outcome: Not Progressing   Problem: Health Behavior/Discharge Planning: Goal: Ability to identify and utilize available resources and services will improve Outcome: Not Progressing   Problem: Metabolic: Goal: Ability to maintain appropriate glucose levels will improve Outcome: Not Progressing   Problem: Elimination: Goal: Will not experience complications related to bowel motility Outcome: Not Progressing

## 2023-10-29 NOTE — Progress Notes (Signed)
RT note. Patient transported to CT and back without any complications. 

## 2023-10-29 NOTE — Progress Notes (Addendum)
 Pharmacy Antibiotic Note  Miranda Cochran is a 73 y.o. female admitted on 10/26/2023 with pneumonia.  Pharmacy has been consulted for Unasyn dosing. Note Amox/Augmentin  listed as allergy -reaction is diarrhea which is not allergy. Will monitor stool output while on therapy.   Wbc up 15.5. Increased Vent settings. Tm 102.3 MRSA PCR negative. UA negative.   Plan: Unasyn 3g IV every 6 hours - monitor renal function.   Height: 5\' 4"  (162.6 cm) Weight: 55 kg (121 lb 4.1 oz) IBW/kg (Calculated) : 54.7  Temp (24hrs), Avg:100.2 F (37.9 C), Min:97.6 F (36.4 C), Max:102.3 F (39.1 C)  Recent Labs  Lab 10/26/23 2150 10/26/23 2227 10/26/23 2228 10/27/23 0504 10/27/23 1109 10/27/23 1830 10/28/23 0443 10/28/23 1600 10/29/23 0337  WBC 16.5*  --   --  14.7*  --   --  11.8*  --  15.5*  CREATININE 0.62 0.50  --  0.52  --   --  0.46 0.47 0.64  LATICACIDVEN  --   --  3.3*  --  2.0* 1.7  --   --   --     Estimated Creatinine Clearance: 55.7 mL/min (by C-G formula based on SCr of 0.64 mg/dL).    Allergies  Allergen Reactions   Fentanyl  Anxiety    Made her feel very anxious, requests not to get it.   Levofloxacin Anxiety    Weird dreams    Amoxicillin  Diarrhea    Has patient had a PCN reaction causing immediate rash, facial/tongue/throat swelling, SOB or lightheadedness with hypotension: No Has patient had a PCN reaction causing severe rash involving mucus membranes or skin necrosis: No Has patient had a PCN reaction that required hospitalization: No Has patient had a PCN reaction occurring within the last 10 years: Yes If all of the above answers are "NO", then may proceed with Cephalosporin use.    Augmentin  [Amoxicillin -Pot Clavulanate] Other (See Comments)    diarrhea   Farxiga  [Dapagliflozin ] Diarrhea    Gi pain   Statins     Myalgia     Antimicrobials this admission: Unasyn 5/17 >>  Dose adjustments this admission:   Microbiology results: pending  Thank you for  allowing pharmacy to be a part of this patient's care.  Lenard Quam, PharmD, BCPS, BCCCP Please refer to [facility] for [facility] numbers 10/29/2023 7:50 AM

## 2023-10-29 NOTE — Progress Notes (Signed)
 0030 CCM contacted via phone Patient temp 102.3 F Elink RN ordered tylenol  0309 Temp 99.5 F 0400 O2 saturations 88% on vent settings with no obstruction RT called to bedside Pt tachycardic with new PVCs 140s-150s  Hypotension  Levophed  restarted and began weaning fentanyl  drip.   0430  CCM in room with this RN and RRT Patient FiO2 increased from 40%-100% Dr.  Iran Manna orders CXR and ABG Temp now 102.3  0700  Handoff to [place], [place] bedside Fentanyl  @ 75 Propofol  @ 50  Levophed  @ 12 Electrolyte replacemnts running FiO2 increased to 60% RRT aware

## 2023-10-29 NOTE — Progress Notes (Signed)
 eLink Physician-Brief Progress Note Patient Name: Miranda Cochran DOB: 1950-06-28 MRN: [id number]   Date of Service  10/29/2023  HPI/Events of Note  Patient with transient desaturation, breath sounds are equal bilaterally, no change in ET tube position at the lip, chest is clear to auscultation per the bedside RN.  eICU Interventions  Stat portable CXR and ABG ordered, patient is now on 100 % FiO2 pending review of lab data.        Gailya Tauer U Delane Stalling 10/29/2023, 4:12 AM

## 2023-10-29 NOTE — Progress Notes (Addendum)
 NEUROLOGY CONSULT NOTE   Date of service: Oct 29, 2023 Patient Name: Miranda Cochran MRN:  [id number] DOB:  [birth year] Chief Complaint: "seizure " Requesting Provider: Mannam, Praveen, MD  Subjective  Seen and examined. Overnight EEG showed seizures without clinical signs emanating from the right frontal region and continuous generalized slowing in the right hemisphere. EEG worsened last evening and on average 4 seizures noted per hour lasting about 1 minute each  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   GERD (gastroesophageal reflux disease)    on meds   Hiatal hernia    Hx of adenomatous polyp of colon 07/07/2009   Hyperlipidemia    diet controlled   Hypertension    on meds - LASIX    IBS (irritable bowel syndrome)    Iron  deficiency anemia    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 Willy Harvest, adenimatous polyps, repeat in 5 yrs   ESOPHAGOGASTRODUODENOSCOPY  07/06/2021   per Dr. Willy Harvest, showed portal hypertensive gastropathy, repeat in 2 yrs   ESOPHAGOGASTRODUODENOSCOPY (EGD) WITH PROPOFOL  N/A  06/20/2017   Procedure: ESOPHAGOGASTRODUODENOSCOPY (EGD) WITH PROPOFOL ;  Surgeon: Kenney Peacemaker, MD;  Location: [facility];  Service: Endoscopy;  Laterality: N/A;  with Barrx   EYE SURGERY     NASAL SEPTOPLASTY W/ TURBINOPLASTY     POLYPECTOMY  2015   CG-TA   TONSILLECTOMY      Family History: Family History  Problem Relation Age of Onset   Stroke Mother    Hypertension Mother    Colon cancer Father 23   Liver cancer Father 42   Colon polyps Father 77   Heart disease Sister        hole in heart   Pancreatic cancer Maternal Aunt    Brain cancer Maternal Uncle    Stroke Maternal Grandmother    Hypertension Maternal Grandmother    Esophageal cancer Maternal Grandfather    Throat cancer Maternal Grandfather    Breast cancer Paternal Grandmother    Brain cancer Paternal Grandmother    Lung cancer Paternal Grandfather    Rectal cancer Neg Hx    Stomach cancer Neg Hx     Social History  reports that she has never smoked. She has never used smokeless tobacco. She reports current alcohol use. She reports that she does not use drugs.  Allergies  Allergen Reactions   Fentanyl  Anxiety    Made her feel very anxious, requests not to  get it.   Levofloxacin Anxiety    Weird dreams    Amoxicillin  Diarrhea    Has patient had a PCN reaction causing immediate rash, facial/tongue/throat swelling, SOB or lightheadedness with hypotension: No Has patient had a PCN reaction causing severe rash involving mucus membranes or skin necrosis: No Has patient had a PCN reaction that required hospitalization: No Has patient had a PCN reaction occurring within the last 10 years: Yes If all of the above answers are "NO", then may proceed with Cephalosporin use.    Augmentin  [Amoxicillin -Pot Clavulanate] Other (See Comments)    diarrhea   Farxiga  [Dapagliflozin ] Diarrhea    Gi pain   Statins     Myalgia     Medications   Current Facility-Administered Medications:    acetaminophen  (TYLENOL )  160 MG/5ML solution 650 mg, 650 mg, Per Tube, Q4H PRN, Ogan, Okoronkwo U, MD, 650 mg at 10/29/23 0145   Ampicillin-Sulbactam (UNASYN) 3 g in sodium chloride  0.9 % 100 mL IVPB, 3 g, Intravenous, Q6H, Millen, Jessica B, RPH   Chlorhexidine  Gluconate Cloth 2 % PADS 6 each, 6 each, Topical, Q0600, Josiah Nigh, MD, 6 each at 10/27/23 1234   Chlorhexidine  Gluconate Cloth 2 % PADS 6 each, 6 each, Topical, Daily, Josiah Nigh, MD, 6 each at 10/28/23 [phone]   cyanocobalamin  (VITAMIN B12) tablet 500 mcg, 500 mcg, Oral, Daily, Kakrakandy, Arshad N, MD, 500 mcg at 10/28/23 9604   ezetimibe  (ZETIA ) tablet 10 mg, 10 mg, Oral, Daily, Angelene Kelly, MD, 10 mg at 10/28/23 5409   feeding supplement (OSMOLITE 1.5 CAL) liquid 1,000 mL, 1,000 mL, Per Tube, Continuous, Mannam, Praveen, MD, Last Rate: 25 mL/hr at 10/29/23 0513, Infusion Verify at 10/29/23 0513   feeding supplement (PROSource TF20) liquid 60 mL, 60 mL, Per Tube, Daily, Mannam, Praveen, MD, 60 mL at 10/28/23 1450   fentaNYL  (SUBLIMAZE ) injection 25 mcg, 25 mcg, Intravenous, Q15 min PRN, Josiah Nigh, MD, 25 mcg at 10/27/23 1430   fentaNYL  (SUBLIMAZE ) injection 25-100 mcg, 25-100 mcg, Intravenous, Q30 min PRN, Josiah Nigh, MD, 50 mcg at 10/28/23 1427   fentaNYL  (SUBLIMAZE ) injection 50 mcg, 50 mcg, Intravenous, Once, Josiah Nigh, MD   fentaNYL  in NS (19mcg/ml) infusion-PREMIX, 0-400 mcg/hr, Intravenous, Continuous, Josiah Nigh, MD, Last Rate: 7.5 mL/hr at 10/29/23 0513, 75 mcg/hr at 10/29/23 0513   hydrALAZINE  (APRESOLINE ) injection 10 mg, 10 mg, Intravenous, Q4H PRN, Akula, Vijaya, MD, 10 mg at 10/27/23 1201   hydrocortisone  sodium succinate  (SOLU-CORTEF ) 100 MG injection 100 mg, 100 mg, Intravenous, Daily, Josiah Nigh, MD, 100 mg at 10/28/23 8119   insulin  aspart (novoLOG ) injection 0-20 Units, 0-20 Units, Subcutaneous, Q4H, Josiah Nigh, MD, 11 Units at 10/29/23 0350   insulin  glargine-yfgn (SEMGLEE) injection  12 Units, 12 Units, Subcutaneous, Daily, Mannam, Praveen, MD   levETIRAcetam  (KEPPRA ) undiluted injection 1,500 mg, 1,500 mg, Intravenous, Q12H, Kamil Mchaffie, MD   midazolam  (VERSED ) injection 2 mg, 2 mg, Intravenous, Once, Josiah Nigh, MD   multivitamin with minerals tablet 1 tablet, 1 tablet, Per Tube, Daily, Mannam, Praveen, MD, 1 tablet at 10/28/23 1450   norepinephrine  (LEVOPHED ) 4mg  in (0.016 mg/mL) premix infusion, 0-40 mcg/min, Intravenous, Titrated, Josiah Nigh, MD, Last Rate: 105 mL/hr at 10/29/23 0753, 28 mcg/min at 10/29/23 0753   Oral care mouth rinse, 15 mL, Mouth Rinse, Q2H, Josiah Nigh, MD, 15 mL at 10/29/23 0745   Oral care mouth rinse, 15 mL, Mouth Rinse, PRN, Felipe Horton,  Tino Foreman, MD   pantoprazole  (PROTONIX ) injection 40 mg, 40 mg, Intravenous, QHS, Josiah Nigh, MD, 40 mg at 11-16-23 2100   potassium PHOSPHATE 45 mmol in dextrose  5 % 500 mL infusion, 45 mmol, Intravenous, Once, Ogan, Okoronkwo U, MD, Last Rate: 64.4 mL/hr at 10/29/23 0632, 45 mmol at 10/29/23 6213   propofol  (DIPRIVAN ) 1000 MG/100ML infusion, 0-50 mcg/kg/min, Intravenous, Continuous, Josiah Nigh, MD, Last Rate: 16.5 mL/hr at 10/29/23 0646, 50 mcg/kg/min at 10/29/23 0646   thiamine  (VITAMIN B1) tablet 100 mg, 100 mg, Per Tube, Daily, Mannam, Praveen, MD, 100 mg at 11-16-2023 1450   vasopressin (PITRESSIN) 20 Units in 100 mL (0.2 unit/mL) infusion-*FOR SHOCK*, 0-0.03 Units/min, Intravenous, Continuous, Mannam, Praveen, MD  Vitals   Vitals:   10/29/23 0615 10/29/23 0630 10/29/23 0746 10/29/23 0755  BP: (!) 83/59 (!) 93/55 (!) 133/55   Pulse: (!) 105 (!) 105 (!) 120   Resp: (!) 23 (!) 26 (!) 26   Temp:  99.8 F (37.7 C)  100 F (37.8 C)  TempSrc:  Axillary  Axillary  SpO2: 91% 93% 98%   Weight:      Height:        Body mass index is 20.81 kg/m.  Physical Exam   Constitutional: Appears ill, Eyes: No scleral injection.  HENT: Currently intubated Head: Large scalp hematoma, with  dressing in place. Cardiovascular: Tachycardic  Neurologic Examination  Much more drowsy today. Does not open eyes to voice Gaze is midline Pupils are reactive To noxious stimulation, some movement on the right, much less movement on the left upper and lower extremity. Breathing above the ventilator.   Labs/Imaging/Neurodiagnostic studies   CBC:  Recent Labs  Lab 11-16-23 0443 10/29/23 0337 10/29/23 0408  WBC 11.8* 15.5*  --   HGB 9.7* 9.2* 9.9*  HCT 28.6* 29.3* 29.0*  MCV 98.3 102.8*  --   PLT 138* 182  --    Basic Metabolic Panel:  Lab Results  Component Value Date   NA 134 (L) 10/29/2023   K 3.4 (L) 10/29/2023   CO2 19 (L) 10/29/2023   GLUCOSE 247 (H) 10/29/2023   BUN 14 10/29/2023   CREATININE 0.64 10/29/2023   CALCIUM  6.5 (L) 10/29/2023   GFRNONAA >60 10/29/2023   GFRAA >89 11/01/2015   Lipid Panel:  Lab Results  Component Value Date   LDLCALC 113 (H) 02/25/2022   HgbA1c:  Lab Results  Component Value Date   HGBA1C 6.8 (H) 10/27/2023   Urine Drug Screen:  INR  Lab Results  Component Value Date   INR 1.2 10/27/2023   APTT  Lab Results  Component Value Date   APTT 22 (L) 10/27/2023   CT Head without contrast(Personally reviewed): 5/14 IMPRESSION: 1. Mixed subdural and subarachnoid hemorrhage of the right hemisphere, predominantly anterior. No midline shift or other mass effect. 2. Nondisplaced left occipital skull fracture with large left posterior scalp hematoma. 3. No acute fracture or static subluxation of the cervical spine.  5/15 1. Interval increase in size of an acute subdural hematoma along the right cerebral convexity (greatest along the anterior right frontal lobe), now measuring up to 9 mm in thickness (previously 7 mm). 2. Acute subarachnoid hemorrhage scattered along the right cerebral convexity, increased. 3. Hemorrhagic parenchymal contusions within the anterior right temporal lobe and anteroinferior right frontal lobe  have increased in size. 4. Trace acute subarachnoid hemorrhage along the anterolateral left temporal lobe, new from the prior exam. Possible small underlying left temporal lobe hemorrhagic parenchymal  contusions. 5. A 2.3 x 2.6 cm hemorrhagic parenchymal contusion within the left cerebellar hemisphere has increased in size (previously 2.1 x 1.8 cm). 6. Thin subdural hemorrhage layering along the tentorium, bilaterally, similar to the prior exam. 7. Trace subarachnoid hemorrhage (versus prominent choroid plexus) within the inferior aspect of the fourth ventricle. 8. Non-depressed fracture of the left occipital calvarium. 9. Left occipital scalp hematoma and laceration.  5/15 Follow-up/Time 1. An acute subdural hematoma along the right cerebral convexity has undergone some interval redistribution but has not significantly changed in overall extent. 2. Continued slight interval increase in extent of acute subarachnoid hemorrhage scattered along the right cerebral convexity. 3. Progressive edema at sites of hemorrhagic parenchymal contusions within the anteroinferior right frontal lobe and anterior right temporal lobe. 4. Small-volume subarachnoid hemorrhage scattered along the left cerebral convexity has mildly increased. Possible small underlying hemorrhagic parenchymal contusions within the anterolateral left temporal lobe, as before. 5. Thin acute subdural hemorrhage layering along the tentorium bilaterally, unchanged. 6. 2.3 x 2.6 cm acute parenchymal hemorrhage within the left cerebellar hemisphere, unchanged. 7. Trace subarachnoid hemorrhage again questioned at the inferior aspect of the fourth ventricle. 8. No midline shift or hydrocephalus. 9. Known non-depressed fracture of the left occipital calvarium.  5/16 1. Extensive multifocal posttraumatic intracranial hemorrhage, stable since yesterday with EXCEPT FOR: Small 4 mm mixed but predominantly low-density left Subdural  Hematoma has increased (coronal image 46).   2. No midline shift or significant intracranial mass effect despite the findings. No ventriculomegaly. Stable basilar cisterns.   3. Nondisplaced left posterior convexity skull fracture with large  CT C-spine (5/14)  1. Mixed subdural and subarachnoid hemorrhage of the right hemisphere, predominantly anterior. No midline shift or other mass effect. 2. Nondisplaced left occipital skull fracture with large left posterior scalp hematoma. 3. No acute fracture or static subluxation of the cervical spine.   Neurodiagnostics Continuous EEG 10/27/2023 to 10/28/2023 This study is suggestive of cortical dysfunction arising from right hemisphere likely secondary to underlying structural abnormality. Additionally there is moderate to severe diffuse encephalopathy likely related to sedation. No seizures or epileptiform discharges were seen throughout the recording  Continuous EEG 10/28/2023 to 10/29/2023 This study was initially suggestive of cortical dysfunction arising from right hemisphere likely secondary to underlying structural abnormality. Additionally there was moderate to severe diffuse encephalopathy likely related to sedation.  After around 1746 on 10/28/2023, EEG worsened and showed seizures without clinical signs arising from right frontal region. Average 4 seizures were noted per hour, lasting about 1 minute each. I was also notified of EEG seizure this morning around 7:30 AM  ASSESSMENT   Miranda Cochran is a 73 y.o. female with hx of autoimmune hepatitis, cirrhosis of the liver, hypertension, depression, GERD, and hyperlipidemia. Patient arrived on 5/14 via EMS for evaluation after a fall and hit her head yesterday.    Imaging concerning for occipital fracture with acute subarachnoid hemorrhage, multiple contusions and a right subdural hematoma. Most recent CT demonstrates extensive multifocal posttraumatic intracranial hemorrhage, See CT head  above from yesterday.  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  Increased Keppra  standing doses  to 1500 mg twice daily.  If continues to seize, will add second antiepileptic agent. Will follow Discussed with Dr. Waylan Haggard  -- Tona Francis, MD Neurologist [facility] Pager: [phone]  CRITICAL CARE ATTESTATION Performed by: Tona Francis, MD Total critical care time: 37 minutes Critical care time was exclusive of separately billable procedures and treating other patients and/or supervising APPs/Residents/Students Critical care was necessary to treat or prevent imminent or life-threatening deterioration. This patient is critically ill and at significant risk for neurological worsening and/or death and care requires constant monitoring. Critical care was time spent personally by me on the following activities: development of treatment plan with patient and/or surrogate as well as nursing, discussions with consultants, evaluation of patient's response to treatment, examination of patient, obtaining history from patient or surrogate, ordering and performing treatments and interventions, ordering and review of laboratory studies, ordering and review of radiographic studies, pulse oximetry, re-evaluation of patient's condition, participation in multidisciplinary rounds and medical decision making of high complexity in the care of this patient.

## 2023-10-29 NOTE — Progress Notes (Signed)
 LTM maint complete - no skin breakdown under: Fp1 Fp2 [facility] monitored, Event button test confirmed by [facility].

## 2023-10-29 NOTE — Plan of Care (Signed)
Repeat CT head stable

## 2023-10-29 NOTE — Progress Notes (Signed)
 [facility] ADULT ICU REPLACEMENT PROTOCOL   The patient does apply for the [facility] Adult ICU Electrolyte Replacment Protocol based on the criteria listed below:   1.Exclusion criteria: TCTS, ECMO, Dialysis, and Myasthenia Gravis patients 2. Is GFR >/= 30 ml/min? Yes.    Patient's GFR today is >60 3. Is SCr </= 2? Yes.   Patient's SCr is 0.64 mg/dL 4. Did SCr increase >/= 0.5 in 24 hours? No. 5.Pt's weight >40kg  Yes.   6. Abnormal electrolyte(s): Potassium, Phosphorus, Magnesium   7. Electrolytes replaced per protocol 8.  Call MD STAT for K+ </= 2.5, Phos </= 1, or Mag </= 1 Physician:  Dr. Ballard Bongo A Emmaus Brandi 10/29/2023 5:00 AM

## 2023-10-29 NOTE — Progress Notes (Signed)
 LTM maint complete - no skin breakdown under:  Fp1 f7 FP2

## 2023-10-29 NOTE — Procedures (Signed)
 Patient Name: JOVONNE WILTON  MRN: [id number]  Epilepsy Attending: Arleene Lack  Referring Physician/Provider: Josiah Nigh, MD  Duration: 10/28/2023 1319 to 10/29/2023 1329   Patient history: 72yo F with right occipital fracture with acute subarachnoid hemorrhage, multiple contusions and a right subdural hematoma. EEG to evaluate for seizure   Level of alertness: comatose/ lethargic   AEDs during EEG study: LEV, propofol    Technical aspects: This EEG study was done with scalp electrodes positioned according to the 10-20 International system of electrode placement. Electrical activity was reviewed with band pass filter of 1-70Hz , sensitivity of 7 uV/mm, display speed of 74mm/sec with a 60Hz  notched filter applied as appropriate. EEG data were recorded continuously and digitally stored.  Video monitoring was available and reviewed as appropriate.   Description: EEG showed continuous generalized and lateralized right hemisphere 3 to 6 Hz theta-delta slowing with overriding 12-14hz  beta activity. Hyperventilation and photic stimulation were not performed.     After around 1746 on 10/28/2023, EEG showed seizures without clinical signs arising from right frontal region. During seizure, EEG showed polyspikes in right frontal region at 2.5-3hz  which then evolved in frequency to 1-1.5hz  and became higher in amplitude with overlying rhythmicity. EEG then involved left fronto-temporal region. Average 4 seizures were noted per hour, lasting about 1 minute each. As medications were adjusted, seizures resolved.  Last seizure was noted on 10/29/2023 at 1220.   ABNORMALITY - Seizures without clinical signs, right frontal region - Continuous slow, generalized and lateralized right hemisphere   IMPRESSION: This study was initially suggestive of cortical dysfunction arising from right hemisphere likely secondary to underlying structural abnormality. Additionally there was moderate to severe diffuse  encephalopathy likely related to sedation.   After around 1746 on 10/28/2023, EEG worsened and showed seizures without clinical signs arising from right frontal region. Average 4 seizures were noted per hour, lasting about 1 minute each. As medications were adjusted, seizures resolved.  Last seizure was noted on 10/29/2023 at 1220.  Darriana Deboy O Triston Skare

## 2023-10-29 NOTE — Progress Notes (Signed)
 Date and time results received: 10/29/23 0915 (use smartphrase ".now" to insert current time)  Test: lactic acid Critical Value: 3.0  Name of Provider Notified: Mannam, CCM

## 2023-10-29 NOTE — Progress Notes (Signed)
 [name], female   DOB: 23-May-1951, 73 y.o.   MRN: [id number] With sedation off she will open her eyes to pain and has a blinking movement spontaneously.  She flexes to almost localizing on the right.  Not a lot of movement on the left.  Apparently had a subclinical seizure and is scheduled for another head CT.  Head CT yesterday was fairly stable with large right sided contusions.

## 2023-10-29 NOTE — Progress Notes (Addendum)
 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  Has history of recurrent falls at home for unclear reason per husband at bedside.  Was being managed expectantly with NSGY following when had abrupt reduced LOS and tachyarrythmia.  On my arrival in [facility] with pursed lip breathing.  Stat repeat head CT does show enlargement of multiple brain contusions.  NSGY aware and will see patient later today.  PCCM consulted to manage.  Trauma scan neg.  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   GERD (gastroesophageal reflux disease)    on meds   Hiatal hernia    Hx of adenomatous polyp of colon 07/07/2009   Hyperlipidemia    diet controlled   Hypertension    on meds - LASIX    IBS (irritable bowel syndrome)    Iron  deficiency anemia    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 Repeat CT Scan shows mild worsening of bleeding 5/17 seizures  Interim History / Subjective:   Seizures noted on LTM today morning.  Given 2 mg of Versed  and 1 g Keppra  Desats overnight.  Chest x-ray and ABG done Started on Levophed  overnight  Objective    Blood pressure (!) 93/55, pulse (!) 105, temperature 99.8 F (37.7 C), temperature source Axillary, resp. rate (!) 26, height 5\' 4"  (1.626 m), weight 55 kg, SpO2 93%.    Vent Mode: PRVC FiO2 (%):  [40 %-60 %] 60 % Set Rate:  [24 bmp] 24 bmp Vt Set:  [430 mL] 430 mL PEEP:  [5 cmH20] 5 cmH20 Pressure Support:  [8 cmH20] 8 cmH20 Plateau Pressure:  [19 cmH20] 19 cmH20   Intake/Output Summary (Last 24 hours) at 10/29/2023 0730 Last data filed at 10/29/2023 0513 Gross per 24 hour  Intake 1710.33 ml  Output 275 ml  Net 1435.33 ml   Filed Weights   10/26/23 2200  Weight: 55 kg    Examination: Gen:      No acute distress HEENT:  EOMI, sclera anicteric, ETT Neck:     No masses; no thyromegaly Lungs:    Clear to auscultation bilaterally; normal respiratory effort CV:         Regular rate and rhythm; no murmurs Abd:      +  bowel sounds; soft, non-tender; no palpable masses, no distension Ext:    No edema; adequate peripheral perfusion Neuro:, Comatose, unresponsive  Labs/imaging reviewed Significant for potassium 3.2, BUN/creatinine 14/0.64 AST 24, ALT 14 WBC 15.5, hemoglobin 9.2, platelets 182 Chest x-ray with left lower lobe consolidation  Resolved problem list  Hypoglycemia  Assessment and Plan  Acute encephalopathy, inability to protect airway- in context of worsening TBI, r/o seizure, no herniation on CT. TBI- good deal of traumatic subdural hematoma, SAH, R temporal contusion, L cer Continue LTM Given additional dose of Keppra  and Versed  for seizures.  Keppra  dose increased Likely repeat CT head today to reassess bleeding  Acute toxic respiratory failure due to  pneumonia Chest x-ray shows developing left lower lobe infiltrate with leukocytosis Vent requirements higher today Check tracheal aspirate Start Unasyn, check procalcitonin, lactic acid  Shock Likely a combination of septic shock versus sedation induced Checking lactic acid Continue Levophed .  Add vasopressin Will check POCUS echocardiogram later today  Autoimmune hepatitis/cirrhosis on chronic prednisone  On hydrocortisone  100 mg daily in lieu of home prednisone   Hyperglycemia Monitor labs SSI coverage Add long-acting insulin   Best Practice (right click and "Reselect all SmartList Selections" daily)   Diet/type: NPO, started tube feeds 5/16  DVT prophylaxis SCD Pressure ulcer(s): N/A GI prophylaxis: PPI Lines: Central line Foley:  Yes, and it is still needed Code Status:  full code Last date of multidisciplinary goals of care discussion [updated husband] 5/16   Critical care time: 34 mins independent of procedures   The patient is critically ill with multiple organ system failure and requires high complexity decision making for assessment and support, frequent evaluation and titration of therapies, advanced monitoring, review of radiographic studies and interpretation of complex data.   Critical Care Time devoted to patient care services, exclusive of separately billable procedures, described in this note is 35 minutes.   [name] MD [facility] See Amion for pager  If no response to pager , please call [phone] until 7pm After 7:00 pm call Elink  [phone] 10/29/2023, 7:43 AM

## 2023-10-29 NOTE — Progress Notes (Signed)
 Daily Progress Note   Patient Name: Miranda Cochran       Date: 10/29/2023 DOB: 1950/07/20  Age: 73 y.o. MRN#: [id number] Attending Physician: Mannam, Praveen, MD Primary Care Physician: Donley Furth, MD Admit Date: 10/26/2023  Reason for Consultation/Follow-up: Establishing goals of care   Length of Stay: 2  Current Medications: Scheduled Meds:   Chlorhexidine  Gluconate Cloth  6 each Topical Q0600   Chlorhexidine  Gluconate Cloth  6 each Topical Daily   cyanocobalamin   500 mcg Oral Daily   ezetimibe   10 mg Oral Daily   feeding supplement (PROSource TF20)  60 mL Per Tube Daily   fentaNYL  (SUBLIMAZE ) injection  50 mcg Intravenous Once   hydrocortisone  sod succinate (SOLU-CORTEF ) inj  100 mg Intravenous Daily   insulin  aspart  0-20 Units Subcutaneous Q4H   insulin  glargine-yfgn  12 Units Subcutaneous Daily   levETIRAcetam   1,500 mg Intravenous Q12H   midazolam   2 mg Intravenous Once   multivitamin with minerals  1 tablet Per Tube Daily   mouth rinse  15 mL Mouth Rinse Q2H   pantoprazole  (PROTONIX ) IV  40 mg Intravenous QHS   thiamine   100 mg Per Tube Daily    Continuous Infusions:  ampicillin-sulbactam (UNASYN) IV     feeding supplement (OSMOLITE 1.5 CAL) 35 mL/hr at 10/29/23 0400   fentaNYL  infusion INTRAVENOUS 75 mcg/hr (10/29/23 0513)   norepinephrine  (LEVOPHED ) Adult infusion 28 mcg/min (10/29/23 0753)   potassium PHOSPHATE IVPB (in mmol) 45 mmol (10/29/23 4098)   propofol  (DIPRIVAN ) infusion 50 mcg/kg/min (10/29/23 0646)   vasopressin 0.03 Units/min (10/29/23 0802)    PRN Meds: acetaminophen  (TYLENOL ) oral liquid 160 mg/5 mL, fentaNYL  (SUBLIMAZE ) injection, fentaNYL  (SUBLIMAZE ) injection, hydrALAZINE , mouth rinse  Physical Exam Vitals reviewed.  Constitutional:       General: She is not in acute distress.    Appearance: She is ill-appearing.     Interventions: She is sedated and intubated.  Cardiovascular:     Rate and Rhythm: Tachycardia present.  Pulmonary:     Effort: She is intubated.  Skin:    General: Skin is dry.  Neurological:     Mental Status: She is unresponsive.             Vital Signs: BP (!) 133/55   Pulse (!) 120   Temp 100 F (  37.8 C) (Axillary)   Resp (!) 26   Ht 5\' 4"  (1.626 m)   Wt 55 kg   SpO2 98%   BMI 20.81 kg/m  SpO2: SpO2: 98 % O2 Device: O2 Device: Ventilator O2 Flow Rate:         Palliative Assessment/Data: 20% with feeds      Patient Active Problem List   Diagnosis Date Noted   Protein-calorie malnutrition, severe 10/28/2023   Subdural hematoma (HCC) 10/27/2023   Hypokalemia 10/27/2023   Hypertensive urgency 10/27/2023   Acute CVA (cerebrovascular accident) (HCC) 10/27/2023   Subdural bleeding (HCC) 10/27/2023   TBI (traumatic brain injury) (HCC) 10/27/2023   Osteoporosis 08/29/2023   Uncontrolled diabetes mellitus with hyperglycemia, with long-term current use of insulin  (HCC) 02/25/2022   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   Cellulitis of right lower leg 04/07/2021   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 08/29/2020   Alcohol use 08/29/2020   Family history of colon cancer in father dx 38 and died 35 w/ second cancer 12-Sep-2019   Insomnia 06/11/2019   Asthma 06/11/2019   Venous (peripheral) insufficiency 04/12/2018   Long term (current) use of systemic steroids 11/02/2017   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 female  with past medical history of autoimmune hepatitis with cirrhosis of liver, hypertension, depression, GERD, hyperlipidemia admitted on 10/26/2023 after falling and hitting her head.   In ED CT showed subdural hematoma, subarachnoid hemorrhage, and occipital skull fracture. She is intubated.    Note from neurology 5/16: Most recent CT demonstrates extensive multifocal posttraumatic intracranial hemorrhage,stable since yesterday with EXCEPT FOR: Small 4 mm mixed but predominantly low-density left Subdural Hematoma has increased.  Today's Discussion: Reviewed chart and received update from nursing. Patient is now on two pressors and FiO2 increased to 90%. Patient EEG last night showed seizures. She had a fever. Patient in NAD. No family at bedside.  10:20 am: Met with patient's husband Chet and sister at bedside. Chet does not have any questions or concerns regarding our goals of care discussion yesterday. He is reading the Hard Choices booklet. We discuss the updates from overnight. Nursing was available to answer family questions. We discussed that the patient has significant vent support at this time. The family is hopeful that with time the patient will have improvement.  Encouraged family to call PMT with questions or concerns.    Recommendations/Plan: Full code Full scope Time for outcomes Continued PMT support   Code Status:    Code Status Orders  (From admission, onward)           Start     Ordered   10/27/23 0330  Full code  Continuous       Question:  By:  Answer:  Consent: discussion documented in EHR   10/27/23 0330         Extensive chart review has been completed prior to seeing the patient including labs, vital signs, imaging, progress/consult notes, orders, medications, and available advance  directive documents.    Care  plan was discussed with bedside RN  Time spent: 50 minutes  Thank you for allowing the Palliative Medicine Team to assist in the care of this patient.    Daina Drum, NP  Please contact Palliative Medicine Team phone at [phone] for questions and concerns.

## 2023-10-30 ENCOUNTER — Inpatient Hospital Stay (HOSPITAL_COMMUNITY)

## 2023-10-30 DIAGNOSIS — E876 Hypokalemia: Secondary | ICD-10-CM | POA: Diagnosis not present

## 2023-10-30 DIAGNOSIS — Z7189 Other specified counseling: Secondary | ICD-10-CM | POA: Diagnosis not present

## 2023-10-30 DIAGNOSIS — Z515 Encounter for palliative care: Secondary | ICD-10-CM | POA: Diagnosis not present

## 2023-10-30 DIAGNOSIS — R569 Unspecified convulsions: Secondary | ICD-10-CM | POA: Diagnosis not present

## 2023-10-30 DIAGNOSIS — G40901 Epilepsy, unspecified, not intractable, with status epilepticus: Secondary | ICD-10-CM | POA: Diagnosis not present

## 2023-10-30 DIAGNOSIS — S066XAA Traumatic subarachnoid hemorrhage with loss of consciousness status unknown, initial encounter: Secondary | ICD-10-CM | POA: Diagnosis not present

## 2023-10-30 DIAGNOSIS — G9340 Encephalopathy, unspecified: Secondary | ICD-10-CM | POA: Diagnosis not present

## 2023-10-30 DIAGNOSIS — S065XAA Traumatic subdural hemorrhage with loss of consciousness status unknown, initial encounter: Secondary | ICD-10-CM | POA: Diagnosis not present

## 2023-10-30 LAB — GLUCOSE, CAPILLARY
Glucose-Capillary: 161 mg/dL — ABNORMAL HIGH (ref 70–99)
Glucose-Capillary: 119 mg/dL — ABNORMAL HIGH (ref 70–99)
Glucose-Capillary: 279 mg/dL — ABNORMAL HIGH (ref 70–99)
Glucose-Capillary: 305 mg/dL — ABNORMAL HIGH (ref 70–99)
Glucose-Capillary: 327 mg/dL — ABNORMAL HIGH (ref 70–99)

## 2023-10-30 LAB — COMPREHENSIVE METABOLIC PANEL WITH GFR
ALT: 17 U/L (ref 0–44)
AST: 27 U/L (ref 15–41)
Albumin: 2.1 g/dL — ABNORMAL LOW (ref 3.5–5.0)
Alkaline Phosphatase: 76 U/L (ref 38–126)
Anion gap: 8 (ref 5–15)
BUN: 16 mg/dL (ref 8–23)
CO2: 22 mmol/L (ref 22–32)
Calcium: 6.9 mg/dL — ABNORMAL LOW (ref 8.9–10.3)
Chloride: 106 mmol/L (ref 98–111)
Creatinine, Ser: 0.44 mg/dL (ref 0.44–1.00)
GFR, Estimated: >60 mL/min (ref >60)
Glucose, Bld: 134 mg/dL — ABNORMAL HIGH (ref 70–99)
Potassium: 3.6 mmol/L (ref 3.5–5.1)
Sodium: 136 mmol/L (ref 135–145)
Total Bilirubin: 1.2 mg/dL (ref 0.0–1.2)
Total Protein: 5.2 g/dL — ABNORMAL LOW (ref 6.5–8.1)

## 2023-10-30 LAB — CBC
HCT: 27.6 % — ABNORMAL LOW (ref 36.0–46.0)
Hemoglobin: 9.5 g/dL — ABNORMAL LOW (ref 12.0–15.0)
MCH: 33 pg (ref 26.0–34.0)
MCHC: 34.4 g/dL (ref 30.0–36.0)
MCV: 95.8 fL (ref 80.0–100.0)
Platelets: 190 10*3/uL (ref 150–400)
RBC: 2.88 MIL/uL — ABNORMAL LOW (ref 3.87–5.11)
RDW: 14.8 % (ref 11.5–15.5)
WBC: 20.9 10*3/uL — ABNORMAL HIGH (ref 4.0–10.5)
nRBC: 0 % (ref 0.0–0.2)

## 2023-10-30 LAB — PHOSPHORUS
Phosphorus: 2.1 mg/dL — ABNORMAL LOW (ref 2.5–4.6)
Phosphorus: 1.9 mg/dL — ABNORMAL LOW (ref 2.5–4.6)

## 2023-10-30 LAB — LACTIC ACID, PLASMA: Lactic Acid, Venous: 1.8 mmol/L (ref 0.5–1.9)

## 2023-10-30 LAB — MAGNESIUM: Magnesium: 2.1 mg/dL (ref 1.7–2.4)

## 2023-10-30 MED ORDER — PHENOBARBITAL SODIUM 65 MG/ML IJ SOLN
65.0000 mg | Freq: Two times a day (BID) | INTRAMUSCULAR | Status: DC
  Administered 2023-10-30 (×2): 65 mg via INTRAVENOUS
  Filled 2023-10-30 (×2): qty 1

## 2023-10-30 MED ORDER — POTASSIUM PHOSPHATES 15 MMOLE/5ML IV SOLN
20.0000 mmol | Freq: Once | INTRAVENOUS | Status: AC
  Administered 2023-10-30: 20 mmol via INTRAVENOUS
  Filled 2023-10-30: qty 6.67

## 2023-10-30 NOTE — Progress Notes (Signed)
 LTM maint complete - no skin breakdown under:  F7, Fp1,Fp2

## 2023-10-30 NOTE — Plan of Care (Signed)
  Problem: Tissue Perfusion: Goal: Ability to maintain intracranial pressure will improve Outcome: Progressing   Problem: Respiratory: Goal: Will regain and/or maintain adequate ventilation Outcome: Progressing   Problem: Skin Integrity: Goal: Risk for impaired skin integrity will decrease Outcome: Progressing Goal: Demonstration of wound healing without infection will improve Outcome: Progressing   Problem: Nutritional: Goal: Risk of aspiration will decrease Outcome: Progressing Goal: Dietary intake will improve Outcome: Progressing   Problem: Physical Regulation: Goal: Complications related to the disease process, condition or treatment will be avoided or minimized Outcome: Progressing   Problem: Safety: Goal: Ability to remain free from injury will improve Outcome: Progressing   Problem: Education: Goal: Knowledge of the prescribed therapeutic regimen Outcome: Not Progressing Goal: Knowledge of disease or condition will improve Outcome: Not Progressing   Problem: Clinical Measurements: Goal: Neurologic status will improve Outcome: Not Progressing   Problem: Psychosocial: Goal: Ability to verbalize positive feelings about self will improve Outcome: Not Progressing Goal: Ability to participate in self-care as condition permits will improve Outcome: Not Progressing Goal: Ability to identify appropriate support needs will improve Outcome: Not Progressing   Problem: Health Behavior/Discharge Planning: Goal: Ability to manage health-related needs will improve Outcome: Not Progressing   Problem: Communication: Goal: Ability to communicate needs accurately will improve Outcome: Not Progressing   Problem: Education: Goal: Knowledge of disease or condition will improve Outcome: Not Progressing Goal: Understanding of discharge needs will improve Outcome: Not Progressing   Problem: Health Behavior/Discharge Planning: Goal: Ability to identify changes in lifestyle  to reduce recurrence of condition will improve Outcome: Not Progressing Goal: Identification of resources available to assist in meeting health care needs will improve Outcome: Not Progressing

## 2023-10-30 NOTE — Progress Notes (Signed)
 NEUROLOGY CONSULT NOTE   Date of service: Oct 30, 2023 Patient Name: Miranda Cochran MRN:  [id number] DOB:  10/12/1950 Chief Complaint: "seizure " Requesting Provider: Mannam, Praveen, MD  Subjective  Seen and examined. Overnight EEG with no seizures.  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   GERD (gastroesophageal reflux disease)    on meds   Hiatal hernia    Hx of adenomatous polyp of colon 07/07/2009   Hyperlipidemia    diet controlled   Hypertension    on meds - LASIX    IBS (irritable bowel syndrome)    Iron  deficiency anemia    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 Willy Harvest, adenimatous polyps, repeat in 5 yrs   ESOPHAGOGASTRODUODENOSCOPY  07/06/2021   per Dr. Willy Harvest, showed portal hypertensive gastropathy, repeat in 2 yrs   ESOPHAGOGASTRODUODENOSCOPY (EGD) WITH PROPOFOL  N/A 06/20/2017   Procedure: ESOPHAGOGASTRODUODENOSCOPY (EGD) WITH PROPOFOL ;  Surgeon: Kenney Peacemaker, MD;  Location: [facility];  Service:  Endoscopy;  Laterality: N/A;  with Barrx   EYE SURGERY     NASAL SEPTOPLASTY W/ TURBINOPLASTY     POLYPECTOMY  2015   CG-TA   TONSILLECTOMY      Family History: Family History  Problem Relation Age of Onset   Stroke Mother    Hypertension Mother    Colon cancer Father 17   Liver cancer Father 26   Colon polyps Father 75   Heart disease Sister        hole in heart   Pancreatic cancer Maternal Aunt    Brain cancer Maternal Uncle    Stroke Maternal Grandmother    Hypertension Maternal Grandmother    Esophageal cancer Maternal Grandfather    Throat cancer Maternal Grandfather    Breast cancer Paternal Grandmother    Brain cancer Paternal Grandmother    Lung cancer Paternal Grandfather    Rectal cancer Neg Hx    Stomach cancer Neg Hx     Social History  reports that Miranda Cochran has never smoked. Miranda Cochran has never used smokeless tobacco. Miranda Cochran reports current alcohol use. Miranda Cochran reports that Miranda Cochran does not use drugs.  Allergies  Allergen Reactions   Fentanyl  Anxiety    Made her feel very anxious, requests not to get it.   Levofloxacin Anxiety    Weird dreams    Amoxicillin  Diarrhea    Has patient had  a PCN reaction causing immediate rash, facial/tongue/throat swelling, SOB or lightheadedness with hypotension: No Has patient had a PCN reaction causing severe rash involving mucus membranes or skin necrosis: No Has patient had a PCN reaction that required hospitalization: No Has patient had a PCN reaction occurring within the last 10 years: Yes If all of the above answers are "NO", then may proceed with Cephalosporin use.    Augmentin  [Amoxicillin -Pot Clavulanate] Other (See Comments)    diarrhea   Farxiga  [Dapagliflozin ] Diarrhea    Gi pain   Statins     Myalgia     Medications   Current Facility-Administered Medications:    acetaminophen  (TYLENOL ) 160 MG/5ML solution 650 mg, 650 mg, Per Tube, Q4H PRN, Ogan, Okoronkwo U, MD, 650 mg at 10/29/23 0145   Ampicillin-Sulbactam (UNASYN) 3 g  in sodium chloride  0.9 % 100 mL IVPB, 3 g, Intravenous, Q6H, Millen, Jessica B, RPH, Stopped at 10/30/23 0836   Chlorhexidine  Gluconate Cloth 2 % PADS 6 each, 6 each, Topical, Daily, Mannam, Praveen, MD, 6 each at 10/29/23 2153   cyanocobalamin  (VITAMIN B12) tablet 500 mcg, 500 mcg, Per Tube, Daily, Mannam, Praveen, MD, 500 mcg at 10/30/23 0906   ezetimibe  (ZETIA ) tablet 10 mg, 10 mg, Per Tube, Daily, Mannam, Praveen, MD, 10 mg at 10/30/23 0906   feeding supplement (OSMOLITE 1.5 CAL) liquid 1,000 mL, 1,000 mL, Per Tube, Continuous, Mannam, Praveen, MD, Last Rate: 45 mL/hr at 10/30/23 0843, Infusion Verify at 10/30/23 0843   feeding supplement (PROSource TF20) liquid 60 mL, 60 mL, Per Tube, Daily, Mannam, Praveen, MD, 60 mL at 10/30/23 1191   fentaNYL  (SUBLIMAZE ) injection 25-100 mcg, 25-100 mcg, Intravenous, Q30 min PRN, Josiah Nigh, MD, 50 mcg at 10/29/23 1616   fentaNYL  (SUBLIMAZE ) injection 50 mcg, 50 mcg, Intravenous, Once, Josiah Nigh, MD   fentaNYL  in NS (81mcg/ml) infusion-PREMIX, 0-400 mcg/hr, Intravenous, Continuous, Josiah Nigh, MD, Stopped at 10/30/23 4782   hydrALAZINE  (APRESOLINE ) injection 10 mg, 10 mg, Intravenous, Q4H PRN, Akula, Vijaya, MD, 10 mg at 10/27/23 1201   hydrocortisone  sodium succinate  (SOLU-CORTEF ) 100 MG injection 100 mg, 100 mg, Intravenous, Daily, Josiah Nigh, MD, 100 mg at 10/30/23 9562   insulin  aspart (novoLOG ) injection 0-20 Units, 0-20 Units, Subcutaneous, Q4H, Josiah Nigh, MD, 4 Units at 10/30/23 0444   insulin  glargine-yfgn (SEMGLEE) injection 12 Units, 12 Units, Subcutaneous, Daily, Mannam, Praveen, MD, 12 Units at 10/30/23 0905   levETIRAcetam  (KEPPRA ) undiluted injection 1,500 mg, 1,500 mg, Intravenous, Q12H, Jordan Pardini, MD, 1,500 mg at 10/30/23 1308   midazolam  (VERSED ) injection 2 mg, 2 mg, Intravenous, Once, Josiah Nigh, MD   multivitamin with minerals tablet 1 tablet, 1 tablet, Per Tube, Daily, Mannam, Praveen, MD,  1 tablet at 10/30/23 0906   norepinephrine  (LEVOPHED ) 4mg  in (0.016 mg/mL) premix infusion, 0-40 mcg/min, Intravenous, Titrated, Josiah Nigh, MD, Last Rate: 22.5 mL/hr at 10/30/23 0843, 6 mcg/min at 10/30/23 0843   Oral care mouth rinse, 15 mL, Mouth Rinse, Q2H, Josiah Nigh, MD, 15 mL at 10/30/23 6578   Oral care mouth rinse, 15 mL, Mouth Rinse, PRN, Josiah Nigh, MD   pantoprazole  (PROTONIX ) injection 40 mg, 40 mg, Intravenous, QHS, Josiah Nigh, MD, 40 mg at 10/29/23 2211   propofol  (DIPRIVAN ) 1000 MG/100ML infusion, 0-50 mcg/kg/min, Intravenous, Continuous, Josiah Nigh, MD, Stopped at 10/30/23 4696   thiamine  (VITAMIN B1) tablet 100 mg, 100 mg, Per Tube, Daily, Mannam, Praveen, MD, 100 mg at 10/30/23  1610   vasopressin (PITRESSIN) 20 Units in 100 mL (0.2 unit/mL) infusion-*FOR SHOCK*, 0-0.03 Units/min, Intravenous, Continuous, Mannam, Praveen, MD, Last Rate: 9 mL/hr at 10/30/23 0843, 0.03 Units/min at 10/30/23 0843  Vitals   Vitals:   10/30/23 0732 10/30/23 0756 10/30/23 0800 10/30/23 0802  BP:   (!) 77/50 126/65  Pulse: 82  79 88  Resp: (!) 24  (!) 25 (!) 24  Temp:  98 F (36.7 C)    TempSrc:  Axillary    SpO2: 97%  96% 98%  Weight:      Height:        Body mass index is 23.69 kg/m.  Physical Exam   Constitutional: Appears ill, Eyes: No scleral injection.  HENT: Currently intubated Head: Large scalp hematoma, with dressing in place. Cardiovascular: Tachycardic  Neurologic Examination  Miranda Cochran was sedated intubated with sedation was held for over an hour prior to this exam Extremely obtunded Took a while to open her eyes to noxious stimulation Did not follow commands Pupils were equal round reactive to light No gaze preference or deviation Does not blink to threat from either side Face appears grossly symmetric To noxious stimulation, has some movement and attempts to localization with the right upper extremity and some withdrawal in the right lower  extremity but minimal flicker movement in the left upper and lower extremity to noxious stimulation.   Labs/Imaging/Neurodiagnostic studies   CBC:  Recent Labs  Lab Nov 02, 2023 0337 11-02-2023 0408 10/30/23 0425  WBC 15.5*  --  20.9*  HGB 9.2* 9.9* 9.5*  HCT 29.3* 29.0* 27.6*  MCV 102.8*  --  95.8  PLT 182  --  190   Basic Metabolic Panel:  Lab Results  Component Value Date   NA 136 10/30/2023   K 3.6 10/30/2023   CO2 22 10/30/2023   GLUCOSE 134 (H) 10/30/2023   BUN 16 10/30/2023   CREATININE 0.44 10/30/2023   CALCIUM  6.9 (L) 10/30/2023   GFRNONAA >60 10/30/2023   GFRAA >89 11/01/2015   Lipid Panel:  Lab Results  Component Value Date   LDLCALC 113 (H) 02/25/2022   HgbA1c:  Lab Results  Component Value Date   HGBA1C 6.8 (H) 10/27/2023   Urine Drug Screen:  INR  Lab Results  Component Value Date   INR 1.2 10/27/2023   APTT  Lab Results  Component Value Date   APTT 22 (L) 10/27/2023   PHB 24.8  CT Head without contrast(Personally reviewed): 5/14 IMPRESSION: 1. Mixed subdural and subarachnoid hemorrhage of the right hemisphere, predominantly anterior. No midline shift or other mass effect. 2. Nondisplaced left occipital skull fracture with large left posterior scalp hematoma. 3. No acute fracture or static subluxation of the cervical spine.  5/15 1. Interval increase in size of an acute subdural hematoma along the right cerebral convexity (greatest along the anterior right frontal lobe), now measuring up to 9 mm in thickness (previously 7 mm). 2. Acute subarachnoid hemorrhage scattered along the right cerebral convexity, increased. 3. Hemorrhagic parenchymal contusions within the anterior right temporal lobe and anteroinferior right frontal lobe have increased in size. 4. Miranda acute subarachnoid hemorrhage along the anterolateral left temporal lobe, new from the prior exam. Possible small underlying left temporal lobe hemorrhagic parenchymal  contusions. 5. A 2.3 x 2.6 cm hemorrhagic parenchymal contusion within the left cerebellar hemisphere has increased in size (previously 2.1 x 1.8 cm). 6. Thin subdural hemorrhage layering along the tentorium, bilaterally, similar to the prior exam. 7. Miranda subarachnoid hemorrhage (versus prominent  choroid plexus) within the inferior aspect of the fourth ventricle. 8. Non-depressed fracture of the left occipital calvarium. 9. Left occipital scalp hematoma and laceration.  5/15 Follow-up/Time 1. An acute subdural hematoma along the right cerebral convexity has undergone some interval redistribution but has not significantly changed in overall extent. 2. Continued slight interval increase in extent of acute subarachnoid hemorrhage scattered along the right cerebral convexity. 3. Progressive edema at sites of hemorrhagic parenchymal contusions within the anteroinferior right frontal lobe and anterior right temporal lobe. 4. Small-volume subarachnoid hemorrhage scattered along the left cerebral convexity has mildly increased. Possible small underlying hemorrhagic parenchymal contusions within the anterolateral left temporal lobe, as before. 5. Thin acute subdural hemorrhage layering along the tentorium bilaterally, unchanged. 6. 2.3 x 2.6 cm acute parenchymal hemorrhage within the left cerebellar hemisphere, unchanged. 7. Miranda subarachnoid hemorrhage again questioned at the inferior aspect of the fourth ventricle. 8. No midline shift or hydrocephalus. 9. Known non-depressed fracture of the left occipital calvarium.  5/16 1. Extensive multifocal posttraumatic intracranial hemorrhage, stable since yesterday with EXCEPT FOR: Small 4 mm mixed but predominantly low-density left Subdural Hematoma has increased (coronal image 46).   2. No midline shift or significant intracranial mass effect despite the findings. No ventriculomegaly. Stable basilar cisterns.   3. Nondisplaced left  posterior convexity skull fracture with large  CT C-spine (5/14)  1. Mixed subdural and subarachnoid hemorrhage of the right hemisphere, predominantly anterior. No midline shift or other mass effect. 2. Nondisplaced left occipital skull fracture with large left posterior scalp hematoma. 3. No acute fracture or static subluxation of the cervical spine.   Neurodiagnostics Continuous EEG 10/27/2023 to 10/28/2023 This study is suggestive of cortical dysfunction arising from right hemisphere likely secondary to underlying structural abnormality. Additionally there is moderate to severe diffuse encephalopathy likely related to sedation. No seizures or epileptiform discharges were seen throughout the recording  Continuous EEG 10/28/2023 to 10/29/2023 This study was initially suggestive of cortical dysfunction arising from right hemisphere likely secondary to underlying structural abnormality. Additionally there was moderate to severe diffuse encephalopathy likely related to sedation.  After around 1746 on 10/28/2023, EEG worsened and showed seizures without clinical signs arising from right frontal region. Average 4 seizures were noted per hour, lasting about 1 minute each. I was also notified of EEG seizure this morning around 7:30 AM  Continuous EEG from 10/29/2023 1319 hrs. to 10/30/2023 0730 hrs. Continuous slow, generalized and lateralized right hemisphere.  Study suggestive of cortical dysfunction or arising from right hemisphere likely secondary to underlying structural abnormality.  Additionally moderate to severe diffuse encephalopathy likely related to sedation.  No seizures seen.  ASSESSMENT   KEARSTEN GINTHER is a 73 y.o. female with hx of autoimmune hepatitis, cirrhosis of the liver, hypertension, depression, GERD, and hyperlipidemia. Patient arrived on 5/14 via EMS for evaluation after a fall and hit her head.    Imaging concerning for occipital fracture with acute subarachnoid hemorrhage,  multiple contusions and a right subdural hematoma. Most recent CT demonstrates extensive multifocal posttraumatic intracranial hemorrhage  Repeat CT after admission with some worsening but that has stabilized on further imaging.  Initially loaded with Keppra  due to electrographic seizures, Keppra  dose increased, continue to have electrographic seizures for which phenobarb was added.  Upon increasing dose of Keppra  and addition of phenobarb-electrographically, the seizures have subsided.  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  Increasing the dose of Keppra  and addition of phenobarbital did help and Miranda Cochran has not had any electrographic seizures in the last 24 hours or so --continue Keppra  1500 twice daily for now and will add phenobarbital 64.8 twice daily -- Check phenobarbital level again tomorrow morning.  Will follow  Discussed with Dr. Waylan Haggard  -- Tona Francis, MD Neurologist [facility] Pager: [phone]  CRITICAL CARE ATTESTATION Performed by: Tona Francis, MD Total critical care time: 40 minutes Critical care time was exclusive of separately billable procedures and treating other patients and/or supervising APPs/Residents/Students Critical care was necessary to treat or prevent imminent or life-threatening deterioration. This patient is critically ill and at significant risk for neurological worsening  and/or death and care requires constant monitoring. Critical care was time spent personally by me on the following activities: development of treatment plan with patient and/or surrogate as well as nursing, discussions with consultants, evaluation of patient's response to treatment, examination of patient, obtaining history from patient or surrogate, ordering and performing treatments and interventions, ordering and review of laboratory studies, ordering and review of radiographic studies, pulse oximetry, re-evaluation of patient's condition, participation in multidisciplinary rounds and medical decision making of high complexity in the care of this patient.

## 2023-10-30 NOTE — Progress Notes (Addendum)
 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  Has history of recurrent falls at home for unclear reason per husband at bedside.  Was being managed expectantly with NSGY following when had abrupt reduced LOS and tachyarrythmia.  On my arrival in [facility] with pursed lip breathing.  Stat repeat head CT does show enlargement of multiple brain contusions.  NSGY aware and will see patient later today.  PCCM consulted to manage.  Trauma scan neg.  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   Significant Hospital Events: Including procedures, antibiotic start and stop dates in addition to other pertinent events   5/14 admit 5/15 AMS 5/16 Remains on minimal vent settings, hypoglycemic episode this AM, originally following commands overnight. Repeat CT Scan shows mild worsening of bleeding 5/17 Seizures on EEG.  Given additional dose of Keppra , Keppra  dose increased and loaded with phenobarb  Interim History / Subjective:    Remains on the ventilator, deeply sedated On low-dose Levophed , vasopressin FiO2 down to 40%  Objective    Blood pressure 112/60, pulse 82, temperature 98.3 F (36.8 C), temperature source Axillary, resp. rate (!) 24, height 5\' 4"  (1.626 m), weight 62.6 kg, SpO2 97%.    Vent Mode: PRVC FiO2 (%):  [40 %-80 %] 40 % Set Rate:  [24 bmp] 24 bmp Vt Set:  [430 mL] 430 mL PEEP:  [5 cmH20] 5 cmH20 Plateau Pressure:  [16 cmH20-17 cmH20] 16 cmH20   Intake/Output Summary (Last 24 hours) at 10/30/2023 0753 Last data filed at 10/30/2023 0733 Gross per 24 hour  Intake 4246.67 ml  Output 2525 ml  Net 1721.67 ml   Filed Weights   10/26/23 2200 10/30/23 0418  Weight: 55 kg 62.6 kg   Examination: Blood pressure 112/60, pulse 82, temperature 98.3 F (36.8 C), temperature source Axillary, resp. rate (!) 24, height 5\' 4"  (1.626 m), weight 62.6 kg, SpO2 97%. Gen:      No acute distress HEENT:  EOMI, sclera anicteric ET tube Neck:     No masses; no thyromegaly Lungs:    Clear to auscultation bilaterally; normal respiratory effort CV:         Regular rate and rhythm; no murmurs Abd:      + bowel sounds; soft, non-tender; no palpable masses, no distension Ext:    No edema; adequate peripheral perfusion Neuro: Sedated, unresponsive  Labs/imaging reviewed Significant for BUN/creatinine 16/0.44 WBC increased to 20.9, hemoglobin 9.5, platelets 190 Lactic acid improved to 1.8, PCT 2.66  Resolved problem list  Hypoglycemia  Assessment and Plan  Acute encephalopathy, inability to protect airway- in context of worsening TBI, r/o seizure, no herniation on CT. TBI- good deal of traumatic subdural hematoma,  SAH, R temporal contusion, L cer Continue LTM Given additional dose of Keppra  and Versed  for seizures.  Keppra  dose increased Likely repeat CT head today to reassess bleeding  Acute toxic respiratory failure due to hospital-acquired pneumonia Chest x-ray shows developing left lower lobe  infiltrate with leukocytosis Tracheal aspirate cultures are pending Currently on Unasyn.  Lactic acid has normalized  Shock Likely a combination of septic shock versus sedation induced Bedside echocardiogram shows adequate LV function Wean down pressors as tolerated  Autoimmune hepatitis/cirrhosis on chronic prednisone  On hydrocortisone  100 mg daily in lieu of home prednisone   Hyperglycemia Monitor labs SSI coverage for long-acting insulin   DVT prophylaxis Discussed with neurology.  Would like to hold off on chemical DVT prophylaxis for at least few more days Checking lower extremity Dopplers SCDs  Best Practice (right click and "Reselect all SmartList Selections" daily)   Diet/type: tubefeeds DVT prophylaxis SCD.  Pressure ulcer(s): N/A GI prophylaxis: PPI Lines: Central line Foley:  Yes, and it is still needed Code Status:  full code Last date of multidisciplinary goals of care discussion [updated husband] 5/16   Critical care time:    The patient is critically ill with multiple organ system failure and requires high complexity decision making for assessment and support, frequent evaluation and titration of therapies, advanced monitoring, review of radiographic studies and interpretation of complex data.   Critical Care Time devoted to patient care services, exclusive of separately billable procedures, described in this note is 35 minutes.   Shoshanah Dapper MD [facility] See Amion for pager  If no response to pager , please call [phone] until 7pm After 7:00 pm call Elink  [phone] 10/30/2023, 8:01 AM

## 2023-10-30 NOTE — Progress Notes (Signed)
 Daily Progress Note   Patient Name: Miranda Cochran       Date: 10/30/2023 DOB: 1950-11-11  Age: 73 y.o. MRN#: [id number] Attending Physician: Mannam, Praveen, MD Primary Care Physician: Donley Furth, MD Admit Date: 10/26/2023  Reason for Consultation/Follow-up: Establishing goals of care   Length of Stay: 3  Current Medications: Scheduled Meds:   Chlorhexidine  Gluconate Cloth  6 each Topical Daily   cyanocobalamin   500 mcg Per Tube Daily   ezetimibe   10 mg Per Tube Daily   feeding supplement (PROSource TF20)  60 mL Per Tube Daily   fentaNYL  (SUBLIMAZE ) injection  50 mcg Intravenous Once   hydrocortisone  sod succinate (SOLU-CORTEF ) inj  100 mg Intravenous Daily   insulin  aspart  0-20 Units Subcutaneous Q4H   insulin  glargine-yfgn  12 Units Subcutaneous Daily   levETIRAcetam   1,500 mg Intravenous Q12H   midazolam   2 mg Intravenous Once   multivitamin with minerals  1 tablet Per Tube Daily   mouth rinse  15 mL Mouth Rinse Q2H   pantoprazole  (PROTONIX ) IV  40 mg Intravenous QHS   PHENObarbital  65 mg Intravenous BID   thiamine   100 mg Per Tube Daily    Continuous Infusions:  ampicillin-sulbactam (UNASYN) IV Stopped (10/30/23 0836)   feeding supplement (OSMOLITE 1.5 CAL) 45 mL/hr at 10/30/23 1200   fentaNYL  infusion INTRAVENOUS Stopped (10/30/23 0807)   norepinephrine  (LEVOPHED ) Adult infusion 1 mcg/min (10/30/23 1200)   propofol  (DIPRIVAN ) infusion Stopped (10/30/23 0807)   vasopressin 0.03 Units/min (10/30/23 1200)    PRN Meds: acetaminophen  (TYLENOL ) oral liquid 160 mg/5 mL, fentaNYL  (SUBLIMAZE ) injection, hydrALAZINE , mouth rinse  Physical Exam Vitals reviewed.  Constitutional:      General: She is not in acute distress.    Interventions: She is intubated.   Cardiovascular:     Rate and Rhythm: Normal rate.  Pulmonary:     Effort: She is intubated.  Skin:    General: Skin is dry.  Neurological:     Mental Status: She is unresponsive.             Vital Signs: BP 128/65   Pulse 99   Temp 99.1 F (37.3 C) (Axillary)   Resp (!) 22   Ht 5\' 4"  (1.626 m)   Wt 62.6 kg   SpO2 98%   BMI  23.69 kg/m  SpO2: SpO2: 98 % O2 Device: O2 Device: Ventilator O2 Flow Rate:         Palliative Assessment/Data: 30% with feeds      Patient Active Problem List   Diagnosis Date Noted   Protein-calorie malnutrition, severe 10/28/2023   Subdural hematoma (HCC) 10/27/2023   Hypokalemia 10/27/2023   Hypertensive urgency 10/27/2023   Acute CVA (cerebrovascular accident) (HCC) 10/27/2023   Subdural bleeding (HCC) 10/27/2023   TBI (traumatic brain injury) (HCC) 10/27/2023   Osteoporosis 08/29/2023   Uncontrolled diabetes mellitus with hyperglycemia, with long-term current use of insulin  (HCC) 02/25/2022   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   Cellulitis of right lower leg 04/07/2021   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 08/29/2020   Alcohol use 08/29/2020   Family history of colon cancer in father dx 39 and died 12 w/ second cancer 09-Sep-2019   Insomnia 06/11/2019   Asthma 06/11/2019   Venous (peripheral) insufficiency 04/12/2018   Long term (current) use of systemic steroids 11/02/2017   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 female   with past medical history of autoimmune hepatitis with cirrhosis of liver, hypertension, depression, GERD, hyperlipidemia admitted on 10/26/2023 after falling and hitting her head.   In ED CT showed subdural hematoma, subarachnoid hemorrhage, and occipital skull fracture. She is intubated.    Note from neurology 5/16: Most recent CT demonstrates extensive multifocal posttraumatic intracranial hemorrhage,stable since yesterday with EXCEPT FOR: Small 4 mm mixed but predominantly low-density left Subdural Hematoma has increased.  Today's Discussion: Reviewed chart and received update from nursing.  Patient remains on vasopressors. Today she is requiring less FiO2 (40%). Her sedation is off and she is weaning on the ventilator. Patient looks comfotable in NAD. Husband Ines Mane is at bedside.  Patient's husband Ines Mane remains hopeful that the patient will continue to improve and not require intubation.  Chet shares that there are many people praying for the patient's recovery.  We discussed the plan for PMT chaplain to visit for prayer this week. Emotional support provided.    Encouraged family to call PMT with questions or concerns.    Recommendations/Plan: Full code Full scope Time for outcomes Continued PMT support   Code Status:    Code Status Orders  (From admission, onward)           Start     Ordered   10/27/23 0330  Full code  Continuous       Question:  By:  Answer:  Consent: discussion documented in EHR   10/27/23 0330         Extensive chart review has been completed prior to seeing the patient including labs, vital signs, imaging, progress/consult notes, orders, medications, and available advance directive documents.    Care plan was discussed with bedside RN  Time spent: 35 minutes  Thank you for allowing the Palliative Medicine Team to assist in the care of this patient.    Daina Drum, NP  Please contact  Palliative Medicine Team phone at [phone] for questions  and concerns.

## 2023-10-30 NOTE — Progress Notes (Signed)
 [name], female   DOB: Dec 08, 1950, 73 y.o.   MRN: [id number] Patient's exam is pretty flat this morning but they just turned off her sedation.  Yesterday after turning off the sedation for 15 minutes she would follow commands and open her eyes.  I reviewed her head CT.  Head CT was stable.  Significant contusions but basal cisterns are open.  No indication for surgery.  Continue supportive care

## 2023-10-30 NOTE — Plan of Care (Signed)
 Patient maintained vital signs and ventilator compliance with no acute events overnight.

## 2023-10-30 NOTE — Procedures (Addendum)
 Patient Name: Miranda Cochran  MRN: [id number]  Epilepsy Attending: Arleene Lack  Referring Physician/Provider: Josiah Nigh, MD  Duration: 10/29/2023 1319 to 10/30/2023 1319   Patient history: 72yo F with right occipital fracture with acute subarachnoid hemorrhage, multiple contusions and a right subdural hematoma. EEG to evaluate for seizure   Level of alertness: comatose/ lethargic   AEDs during EEG study: LEV, propofol    Technical aspects: This EEG study was done with scalp electrodes positioned according to the 10-20 International system of electrode placement. Electrical activity was reviewed with band pass filter of 1-70Hz , sensitivity of 7 uV/mm, display speed of 54mm/sec with a 60Hz  notched filter applied as appropriate. EEG data were recorded continuously and digitally stored.  Video monitoring was available and reviewed as appropriate.   Description: EEG showed near continuous generalized and lateralized right hemisphere 3 to 6 Hz theta-delta slowing with overriding 12-14hz  beta activity. Brief 1-2 seconds of generalized eeg attenuation were also noted. Hyperventilation and photic stimulation were not performed.       ABNORMALITY - Continuous slow, generalized and lateralized right hemisphere   IMPRESSION: This study was suggestive of cortical dysfunction arising from right hemisphere likely secondary to underlying structural abnormality. Additionally there was moderate to severe diffuse encephalopathy likely related to sedation. No seizures were noted.    Alinna Siple O Hosanna Betley

## 2023-10-30 NOTE — Progress Notes (Signed)
 NEUROSURGERY PROGRESS NOTE  No change in neuro function. Continue supportive care. No acute events overnight.   Temp:  [98 F (36.7 C)-99.4 F (37.4 C)] 98 F (36.7 C) (05/18 0756) Pulse Rate:  [74-106] 88 (05/18 0802) Resp:  [16-25] 24 (05/18 0802) BP: (77-140)/(46-67) 126/65 (05/18 0802) SpO2:  [95 %-100 %] 98 % (05/18 0802) FiO2 (%):  [40 %-80 %] 40 % (05/18 0732) Weight:  [62.6 kg] 62.6 kg (05/18 0418)    Miranda Mills, NP 10/30/2023 9:08 AM

## 2023-10-30 NOTE — Progress Notes (Signed)
 Pharmacy Electrolyte Replacement  Recent Labs:  Recent Labs    10/30/23 0425  K 3.6  MG 2.1  PHOS 1.9*  CREATININE 0.44    Low Critical Values (K </= 2.5, Phos </= 1, Mg </= 1) Present: None  MD Contacted: n/a  Plan:  KPhos 20 mmol IV x1  Caroline Cinnamon, PharmD, BCPS 2:29 PM

## 2023-10-30 NOTE — Progress Notes (Signed)
 Sedation off for about three hours. Pt responding to strong painful stimuli, withdraws in all extremities. Minimal eye opening, and not following commands. Pupils more brisk and currently weaning on vent. Meyran NP (NSG) updated. Per NSG, no interventions needed. Will continue to monitor neuro status.   Arbutus Knoll, RN

## 2023-10-30 NOTE — Progress Notes (Signed)
 RT note. Patient placed on SBT at this time with the following settings, RT will continue to monitor, RN aware.   10/30/23 1117  Vent Select  Invasive or Noninvasive Invasive  Adult Vent Y  Airway 7.5 mm  Placement Date/Time: 10/27/23 1058   Grade View: Grade 1  Placed By: ICU physician  Airway Device: Endotracheal Tube  Laryngoscope Blade: MAC;3  ETT Types: Oral  Size (mm): 7.5 mm  Cuffed: Cuffed  Insertion attempts: 1  Airway Equipment: Stylet;Video ...  Secured at (cm) 21 cm  Measured From Lips  Secured Location Left  Secured By English as a second language teacher No  Tube Holder Repositioned Yes  Prone position No  Cuff Pressure (cm H2O) Clear OR 27-39 CmH2O  Site Condition Dry  Adult Ventilator Settings  Vent Type Servo i  Humidity HME  Vent Mode (S)  PSV;CPAP  FiO2 (%) 40 %  Pressure Support 5 cmH20  PEEP 5 cmH20  Adult Ventilator Measurements  Peak Airway Pressure 11 L/min  Mean Airway Pressure 7 cmH20  Resp Rate Spontaneous 21 br/min  Resp Rate Total 21 br/min  Spont TV 470 mL  Measured Ve 9.4 L  SpO2 97 %

## 2023-10-31 ENCOUNTER — Inpatient Hospital Stay (HOSPITAL_COMMUNITY)

## 2023-10-31 DIAGNOSIS — S065XAA Traumatic subdural hemorrhage with loss of consciousness status unknown, initial encounter: Secondary | ICD-10-CM | POA: Diagnosis not present

## 2023-10-31 DIAGNOSIS — S066XAA Traumatic subarachnoid hemorrhage with loss of consciousness status unknown, initial encounter: Secondary | ICD-10-CM | POA: Diagnosis not present

## 2023-10-31 DIAGNOSIS — G40901 Epilepsy, unspecified, not intractable, with status epilepticus: Secondary | ICD-10-CM | POA: Diagnosis not present

## 2023-10-31 DIAGNOSIS — S02119A Unspecified fracture of occiput, initial encounter for closed fracture: Secondary | ICD-10-CM | POA: Diagnosis not present

## 2023-10-31 DIAGNOSIS — G40001 Localization-related (focal) (partial) idiopathic epilepsy and epileptic syndromes with seizures of localized onset, not intractable, with status epilepticus: Secondary | ICD-10-CM | POA: Diagnosis not present

## 2023-10-31 DIAGNOSIS — R569 Unspecified convulsions: Secondary | ICD-10-CM | POA: Diagnosis not present

## 2023-10-31 DIAGNOSIS — Z7189 Other specified counseling: Secondary | ICD-10-CM | POA: Diagnosis not present

## 2023-10-31 DIAGNOSIS — M7989 Other specified soft tissue disorders: Secondary | ICD-10-CM | POA: Diagnosis not present

## 2023-10-31 DIAGNOSIS — E876 Hypokalemia: Secondary | ICD-10-CM | POA: Diagnosis not present

## 2023-10-31 DIAGNOSIS — G9340 Encephalopathy, unspecified: Secondary | ICD-10-CM | POA: Diagnosis not present

## 2023-10-31 DIAGNOSIS — W19XXXA Unspecified fall, initial encounter: Secondary | ICD-10-CM

## 2023-10-31 DIAGNOSIS — Z515 Encounter for palliative care: Secondary | ICD-10-CM | POA: Diagnosis not present

## 2023-10-31 HISTORY — PX: IR IVC FILTER PLMT / S&I /IMG GUID/MOD SED: IMG701

## 2023-10-31 LAB — CBC
HCT: 30.7 % — ABNORMAL LOW (ref 36.0–46.0)
Hemoglobin: 9.9 g/dL — ABNORMAL LOW (ref 12.0–15.0)
MCH: 32.8 pg (ref 26.0–34.0)
MCHC: 32.2 g/dL (ref 30.0–36.0)
MCV: 101.7 fL — ABNORMAL HIGH (ref 80.0–100.0)
Platelets: 179 10*3/uL (ref 150–400)
RBC: 3.02 MIL/uL — ABNORMAL LOW (ref 3.87–5.11)
RDW: 14.8 % (ref 11.5–15.5)
WBC: 13.7 10*3/uL — ABNORMAL HIGH (ref 4.0–10.5)
nRBC: 0 % (ref 0.0–0.2)

## 2023-10-31 LAB — GLUCOSE, CAPILLARY
Glucose-Capillary: 156 mg/dL — ABNORMAL HIGH (ref 70–99)
Glucose-Capillary: 187 mg/dL — ABNORMAL HIGH (ref 70–99)
Glucose-Capillary: 192 mg/dL — ABNORMAL HIGH (ref 70–99)
Glucose-Capillary: 204 mg/dL — ABNORMAL HIGH (ref 70–99)
Glucose-Capillary: 220 mg/dL — ABNORMAL HIGH (ref 70–99)
Glucose-Capillary: 264 mg/dL — ABNORMAL HIGH (ref 70–99)
Glucose-Capillary: 295 mg/dL — ABNORMAL HIGH (ref 70–99)

## 2023-10-31 LAB — COMPREHENSIVE METABOLIC PANEL WITH GFR
ALT: 19 U/L (ref 0–44)
AST: 29 U/L (ref 15–41)
Albumin: 2.2 g/dL — ABNORMAL LOW (ref 3.5–5.0)
Alkaline Phosphatase: 94 U/L (ref 38–126)
Anion gap: 11 (ref 5–15)
BUN: 10 mg/dL (ref 8–23)
CO2: 23 mmol/L (ref 22–32)
Calcium: 7.5 mg/dL — ABNORMAL LOW (ref 8.9–10.3)
Chloride: 109 mmol/L (ref 98–111)
Creatinine, Ser: 0.44 mg/dL (ref 0.44–1.00)
GFR, Estimated: >60 mL/min (ref >60)
Glucose, Bld: 156 mg/dL — ABNORMAL HIGH (ref 70–99)
Potassium: 3.6 mmol/L (ref 3.5–5.1)
Sodium: 143 mmol/L (ref 135–145)
Total Bilirubin: 0.9 mg/dL (ref 0.0–1.2)
Total Protein: 6.1 g/dL — ABNORMAL LOW (ref 6.5–8.1)

## 2023-10-31 LAB — MAGNESIUM: Magnesium: 2.2 mg/dL (ref 1.7–2.4)

## 2023-10-31 LAB — TRIGLYCERIDES: Triglycerides: 111 mg/dL (ref <150)

## 2023-10-31 LAB — CULTURE, RESPIRATORY W GRAM STAIN

## 2023-10-31 LAB — PHOSPHORUS: Phosphorus: 1.8 mg/dL — ABNORMAL LOW (ref 2.5–4.6)

## 2023-10-31 LAB — PHENOBARBITAL LEVEL: Phenobarbital: 25.7 ug/mL (ref 15.0–40.0)

## 2023-10-31 MED ORDER — IOHEXOL 350 MG/ML SOLN
67.0000 mL | Freq: Once | INTRAVENOUS | Status: AC | PRN
  Administered 2023-10-31: 67 mL via INTRAVENOUS

## 2023-10-31 MED ORDER — LIDOCAINE HCL 1 % IJ SOLN
INTRAMUSCULAR | Status: AC
  Filled 2023-10-31: qty 20

## 2023-10-31 MED ORDER — SODIUM CHLORIDE 0.9 % IV SOLN
30.0000 mmol | Freq: Once | INTRAVENOUS | Status: AC
  Administered 2023-10-31: 30 mmol via INTRAVENOUS
  Filled 2023-10-31: qty 10

## 2023-10-31 MED ORDER — IOHEXOL 300 MG/ML  SOLN
100.0000 mL | Freq: Once | INTRAMUSCULAR | Status: AC | PRN
  Administered 2023-10-31: 30 mL via INTRAVENOUS

## 2023-10-31 MED ORDER — PREDNISONE 5 MG PO TABS
10.0000 mg | ORAL_TABLET | Freq: Every day | ORAL | Status: DC
  Administered 2023-11-01 – 2023-11-11 (×11): 10 mg
  Filled 2023-10-31: qty 1
  Filled 2023-10-31 (×2): qty 2
  Filled 2023-10-31 (×4): qty 1
  Filled 2023-10-31 (×2): qty 2
  Filled 2023-10-31: qty 1
  Filled 2023-10-31: qty 2

## 2023-10-31 MED ORDER — FENTANYL BOLUS VIA INFUSION
30.0000 ug | INTRAVENOUS | Status: DC | PRN
  Administered 2023-11-01: 30 ug via INTRAVENOUS

## 2023-10-31 MED ORDER — LEVETIRACETAM (KEPPRA) 500 MG/5 ML ADULT IV PUSH
1000.0000 mg | Freq: Two times a day (BID) | INTRAVENOUS | Status: DC
  Administered 2023-10-31 – 2023-11-02 (×6): 1000 mg via INTRAVENOUS
  Filled 2023-10-31 (×6): qty 10

## 2023-10-31 NOTE — Progress Notes (Signed)
 [name], female   DOB: 01/20/1951, 73 y.o.   MRN: [id number] Patient remains intubated.  Localizes on the right withdraws on the left pain when sedation is minimized.  Patient underwent placement of IVC filter secondary to DVTs.  I do not believe he will be safe to have her undergo full anticoagulation.  Continues to be observed on the ventilator.  Hopefully the sedation can be minimized.  Continue supportive care.

## 2023-10-31 NOTE — Progress Notes (Signed)
 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 Autoimmune cirrhosis on chronic prednisone , HTN, depression presenting after fall and hitting head.  Has history of recurrent falls at home for unclear reason per husband at bedside.  Was being managed expectantly with NSGY following when had abrupt reduced LOC and tachyarrythmia.  On my arrival in [facility] with pursed lip breathing.  Stat repeat head CT does show enlargement of multiple brain contusions.  NSGY aware and will see patient later today.  PCCM consulted to manage.  Trauma scan neg.  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   Significant Hospital Events: Including procedures, antibiotic start and stop dates in addition to other pertinent events   5/14 admit 5/15 AMS 5/16 Remains on minimal vent settings, hypoglycemic episode this AM, originally following commands overnight. Repeat CT Scan shows mild worsening of bleeding 5/17 Seizures on EEG.  Given additional dose of Keppra , Keppra  dose increased and loaded with phenobarb. CT head with stable extensive  multifocal posttraumatic ICH, no MLS, basilar cisterns remain patent, stable nondisplaced left posterior skull fx 5/18 EEG neg. Sedation off, tolerating SBT 8/5 but not waking up yet  Interim History / Subjective:  Off pressors and all sedation. Propofol  off since 3pm yesterday. Fentanyl  25 off since around 0730 this AM. Tolerating SBT 8/5 well today but mental status remains poor and remains unresponsive thus far.  Objective    Blood pressure (!) 150/73, pulse (!) 106, temperature (!) 100.4 F (38 C), temperature source Axillary, resp. rate (!) 27, height 5\' 4"  (1.626 m), weight 61.4 kg, SpO2 98%.    Vent Mode: PSV;CPAP FiO2 (%):  [40 %] 40 % Set Rate:  [24 bmp] 24 bmp Vt Set:  [430 mL] 430 mL PEEP:  [5 cmH20] 5 cmH20 Pressure Support:  [5 cmH20-8 cmH20] 8 cmH20 Plateau Pressure:  [16 cmH20] 16 cmH20   Intake/Output Summary (Last 24 hours) at 10/31/2023 1007 Last data filed at 10/31/2023 0800 Gross per 24 hour  Intake 1927.51 ml  Output 1915 ml  Net 12.51 ml   Filed Weights   10/26/23 2200 10/30/23 0418 10/31/23 0442  Weight: 55 kg 62.6 kg 61.4 kg   Examination: General: Adult female, resting in bed, in NAD. Neuro: Not responsive despite no sedation. Is tolerating SBT on PSV 8/5. HEENT: [place]/AT. Sclerae anicteric. ETT in place. Cardiovascular: RRR, no M/R/G.  Lungs: Respirations even and unlabored.  CTA bilaterally, No W/R/R. Abdomen: BS x 4, soft, NT/ND.  Musculoskeletal: No gross deformities, no edema.    Resolved problem list  Hypoglycemia  Assessment and Plan  Acute encephalopathy, inability to protect airway- in context of worsening TBI, r/o seizure, no herniation on CT. TBI- good deal of traumatic subdural hematoma, SAH, R temporal contusion, L cer Continue  LTM Repeat CT head if any acute or worsening neuro function Q2hr neuro checks F/u on phenobarb levels, pheno being stopped today by neuro F/u on brain MRI once off EEG Continue Keppra  per neuro  Acute  hypoxic respiratory failure due to hospital-acquired pneumonia - sputum culture 5/17 with mod GPCs and rare GNRs Continue Unasyn .  Autoimmune hepatitis/cirrhosis on chronic prednisone  D/c hydrocortisone  and place back to PTA Prednisone   Hyperglycemia SSI  DVT prophylaxis Discussed with neurology.  Would like to hold off on chemical DVT prophylaxis for at least few more days. SCDs in the meantime F/u on LE duplex  Hypophosphatemia - being repleted with Kphos 5/19 Continue to monitor BMP  Best Practice (right click and "Reselect all SmartList Selections" daily)   Diet/type: tubefeeds DVT prophylaxis SCD.  Pressure ulcer(s): N/A GI prophylaxis: PPI Lines: Central line Foley:  Yes, and it is still needed Code Status:  full code Last date of multidisciplinary goals of care discussion [updated husband] 5/16   Critical care time: 30 min.    Rafael Bun, PA - [facility] For pager details, please see AMION or use Epic chat  After 1900, please call [facility] for cross coverage needs 10/31/2023, 10:12 AM

## 2023-10-31 NOTE — Progress Notes (Addendum)
 eLink Physician-Brief Progress Note Patient Name: DEMIA VIERA DOB: July 26, 1950 MRN: [id number]   Date of Service  10/31/2023  HPI/Events of Note  73 y.o. female currently admitted for TBI, ICH status post fall. Initially presented to [facility] ED on 10/26/23 after fall at home, hitting her head.  Has a right-sided subdural hematoma with concurrent right lower extremity DVT.  Status post IVC filter today.  Intubated.  eICU Interventions  Will switch as needed fentanyl  to bolus from bag   0556 - renewed antipyretics   Intervention Category Minor Interventions: Routine modifications to care plan (e.g. PRN medications for pain, fever)  Lener Ventresca 10/31/2023, 7:36 PM

## 2023-10-31 NOTE — Progress Notes (Addendum)
 This chaplain responded to PMT NP-Dawn's consult for spiritual care and prayer. The Pt. husband-Chet is at the bedside.  The Pt. husband accepted the chaplain's invitation for storytelling and rapport building. The chaplain learned the Pt. frequently uses her gift of communication. The chaplain understands numerous friends and family are  praying for the Pt. The Pt. husband-Chet feels well supported by the medical team.   The chaplain understands Chet is hopeful Pt. improvement may lead to a space for the Pt. to express her goals of care. The chaplain understands Ines Mane brings caregiver experience to the Pt. bedside through his previous wife's healthcare.  Chet accepted the chaplain's invitation for prayer and F/U spiritual care.  Chaplain Kathleene Papas [phone]

## 2023-10-31 NOTE — Consult Note (Signed)
 Chief Complaint: TBI, ICH, right lower extremity DVT and inability to anticoagulate - IR consulted for IVC filter placement  Referring Provider(s): Mannam, Praveen, MD   Supervising Physician: Erica Hau  Patient Status: [facility] - In-pt  History of Present Illness: Miranda Cochran is a 73 y.o. female currently admitted for TBI, ICH status post fall. Initially presented to [facility] ED on 10/26/23 after fall at home, hitting her head. A left occipital fracture and right subdural hematoma was seen on CT imaging and neurosurgery consult was initiated who wanted to observe for a few days. She had initially been alert and oriented with acute mental status change while still in the ED prior to getting a room on admission. Has since been in partial coma and responsive only to pain. Had US  of lower extremities showing RLE DVT today. Due to ICH and inability to anticoagulate, IR has been consulted for IVC filter placement.   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   GERD (gastroesophageal reflux disease)    on meds   Hiatal hernia    Hx of adenomatous polyp of colon 07/07/2009   Hyperlipidemia    diet controlled   Hypertension    on meds - LASIX    IBS (irritable bowel syndrome)    Iron  deficiency anemia    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 Willy Harvest, adenimatous polyps, repeat in 5 yrs   ESOPHAGOGASTRODUODENOSCOPY  07/06/2021   per Dr. Willy Harvest, showed portal hypertensive gastropathy, repeat in 2 yrs   ESOPHAGOGASTRODUODENOSCOPY (EGD) WITH PROPOFOL  N/A 06/20/2017   Procedure: ESOPHAGOGASTRODUODENOSCOPY (EGD) WITH PROPOFOL ;  Surgeon: Kenney Peacemaker, MD;  Location: [facility] ENDOSCOPY;  Service: Endoscopy;  Laterality: N/A;  with Barrx   EYE SURGERY     NASAL SEPTOPLASTY W/ TURBINOPLASTY     POLYPECTOMY  2015   CG-TA   TONSILLECTOMY      Allergies: Fentanyl , Levofloxacin, Amoxicillin , Augmentin  [amoxicillin -pot clavulanate], Farxiga  [dapagliflozin ], and Statins  Medications: Prior to Admission medications   Medication Sig Start Date End Date Taking? Authorizing Provider  ALPRAZolam  (XANAX ) 0.5 MG tablet TAKE 1 TABLET(0.5 MG) BY MOUTH AT BEDTIME AS NEEDED FOR SLEEP 04/22/23  Yes Donley Furth, MD  AMBULATORY NON FORMULARY MEDICATION ACTIVE LIVER Artichoke leaf extract, Milk thistle, Turmeric Take 1 capsule by mouth once daily   Yes [provider]  aspirin  325 MG tablet Take 650 mg by mouth daily as needed for moderate pain (pain score 4-6) or headache.   Yes [provider]  cetirizine  (ZYRTEC ) 10 MG tablet Take 10 mg by mouth daily.   Yes [provider]  Cholecalciferol 25 MCG (1000 UT) CHEW Chew 1,000 Units by mouth daily.   Yes [provider]  Cyanocobalamin  500 MCG CHEW Chew 500 mcg by mouth daily.   Yes [provider]  diphenhydrAMINE  (BENADRYL ) 25 mg capsule Take 25-50 mg by mouth daily as needed for allergies.    Yes [provider]  ezetimibe  (ZETIA ) 10 MG tablet TAKE 1 TABLET(10 MG) BY MOUTH DAILY 05/20/22  Yes Lajean Pike, MD  furosemide  (LASIX ) 40 MG tablet TAKE 1 TABLET(40 MG) BY MOUTH DAILY  04/22/23  Yes Donley Furth, MD  ibuprofen (ADVIL) 200 MG tablet Take 400 mg by mouth as needed for mild pain (pain score 1-3) or moderate pain (pain score 4-6).   Yes [provider]  insulin  lispro (HUMALOG ) 100 UNIT/ML KwikPen 18-22 Units daily with lunch. 09/21/22 03/13/24 Yes [provider]  lansoprazole  (PREVACID ) 30 MG capsule TAKE 1 CAPSULE(30 MG) BY MOUTH DAILY 04/22/23  Yes Donley Furth, MD  lisinopril  (ZESTRIL ) 10 MG tablet Take 1 tablet (10 mg total) by mouth daily. 09/07/23 09/06/24 Yes Donley Furth, MD  methocarbamol (ROBAXIN) 500 MG tablet Take 500 mg by mouth every 6 (six) hours as needed. 10/20/23  Yes [provider]  Multiple Vitamin (MULTIVITAMIN PO) Take by mouth.   Yes [provider]  naloxone (NARCAN) nasal spray 4 mg/0.1 mL Spray the contents of 1 device into 1 nostril. Call 911. May repeat with 2nd device in alternate nostril if no response in 2-3 minutes. 06/12/23  Yes [provider]  predniSONE  (DELTASONE ) 10 MG tablet Take 10 mg by mouth daily with breakfast.   Yes [provider]  RYBELSUS 14 MG TABS Take 1 tablet by mouth every morning.   Yes [provider]  traZODone  (DESYREL ) 50 MG tablet TAKE 1 TABLET BY MOUTH EVERYDAY AT BEDTIME Patient taking differently: Take 50 mg by mouth at bedtime as needed for sleep. 10/13/23  Yes Donley Furth, MD  tretinoin (RETIN-A) 0.025 % cream Apply topically at bedtime. 11/29/22  Yes [provider]  venlafaxine  XR (EFFEXOR -XR) 75 MG 24 hr capsule TAKE 1 CAPSULE BY MOUTH TWICE DAILY WITH A MEAL Patient taking differently: Take 75 mg by mouth. TAKE 1 CAPSULE BY MOUTH DAILY WITH A MEAL 04/22/23  Yes Donley Furth, MD     Family History  Problem Relation Age of Onset   Stroke Mother    Hypertension Mother    Colon cancer Father 106   Liver cancer Father 73   Colon polyps Father 36   Heart disease Sister        hole in heart   Pancreatic cancer Maternal Aunt     Brain cancer Maternal Uncle    Stroke Maternal Grandmother    Hypertension Maternal Grandmother    Esophageal cancer Maternal Grandfather    Throat cancer Maternal Grandfather    Breast cancer Paternal Grandmother    Brain cancer Paternal Grandmother    Lung cancer Paternal Grandfather    Rectal cancer Neg Hx    Stomach cancer Neg Hx     Social History   Socioeconomic History   Marital status: Married    Spouse name: Not on file   Number of children: 0   Years of education: Not on file   Highest education level: Associate degree: occupational, Scientist, product/process development, or vocational program  Occupational History   Occupation:  Office management    Comment: [employer]  Tobacco Use   Smoking status: Never   Smokeless tobacco: Never  Vaping Use   Vaping status: Never Used  Substance and Sexual Activity   Alcohol use: Yes    Comment: champagne on weekends   Drug use: No   Sexual activity: Yes    Partners: Male    Birth control/protection: Post-menopausal  Other Topics Concern   Not on file  Social History Narrative   Divorced, no children   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 [employer]   Social Drivers of Corporate investment banker Strain: Low Risk  (04/21/2023)   Overall Financial Resource Strain (CARDIA)    Difficulty of Paying Living Expenses: Not hard at all  Food Insecurity: No Food Insecurity (04/21/2023)   Hunger Vital Sign    Worried About Running Out of Food in the Last Year: Never true    Ran Out of Food in the Last Year: Never true  Transportation Needs: No Transportation Needs (04/21/2023)   PRAPARE - Administrator, Civil Service (Medical): No    Lack of Transportation (Non-Medical): No  Physical Activity: Insufficiently Active (04/21/2023)   Exercise Vital Sign    Days of Exercise per Week: 4 days    Minutes of Exercise per Session: 20 min  Stress: Stress Concern Present (04/21/2023)   [employer] of Occupational Health -  Occupational Stress Questionnaire    Feeling of Stress : To some extent  Social Connections: Moderately Integrated (04/21/2023)   Social Connection and Isolation Panel [NHANES]    Frequency of Communication with Friends and Family: Twice a week    Frequency of Social Gatherings with Friends and Family: Once a week    Attends Religious Services: 1 to 4 times per year    Active Member of [employer] or Organizations: No    Attends Engineer, structural: Not on file    Marital Status: Married     Review of Systems: A 12 point ROS discussed and pertinent positives are indicated in the HPI above.  All other systems are negative.  Review of Systems  Unable to perform ROS: Acuity of condition    Vital Signs: BP 134/71   Pulse 96   Temp (!) 101.2 F (38.4 C) (Axillary)   Resp (!) 24   Ht 5\' 4"  (1.626 m)   Wt 135 lb 5.8 oz (61.4 kg)   SpO2 98%   BMI 23.23 kg/m   Advance Care Plan: No documents on file  Physical Exam Vitals reviewed.  Constitutional:      Comments: Exam largely limited secondary to acuity of patients condition. Pt is intubated and responsive only to painful stimuli  Pulmonary:     Comments: Intubated Skin:    General: Skin is warm and dry.  Neurological:     Comments: Responsive to pain, further Unable to assess secondary to acuity of condition     Imaging: VAS US  LOWER EXTREMITY VENOUS (DVT) Result Date: 10/31/2023  Lower Venous DVT Study Patient Name:  Miranda Cochran  Date of Exam:   10/31/2023 Medical Rec #: [id number]      Accession #:    [id number] Date of Birth: 1950/11/14     Patient Gender: F Patient Age:   89 years Exam Location:  [facility] Procedure:      VAS US  LOWER EXTREMITY VENOUS (DVT) Referring Phys: Phyllis Breeze --------------------------------------------------------------------------------  Indications: Edema. Other Indications: Status post fall with resulting  subdural and subarachnoid                    hemorrhage and also occipital  skull fracture with large scalp                    hematoma. Comparison Study: No prior LEV on file Performing Technologist: Carleene Chase RVS  Examination Guidelines: A complete evaluation includes B-mode imaging, spectral Doppler, color Doppler, and power Doppler as needed of all accessible portions of each vessel. Bilateral testing is considered an integral part of a complete examination. Limited examinations for reoccurring indications may be performed as noted. The reflux portion of the exam is performed with the patient in reverse Trendelenburg.  +---------+---------------+---------+-----------+----------+--------------+ RIGHT    CompressibilityPhasicitySpontaneityPropertiesThrombus Aging +---------+---------------+---------+-----------+----------+--------------+ CFV      Full           Yes      Yes                                 +---------+---------------+---------+-----------+----------+--------------+ SFJ      Full                                                        +---------+---------------+---------+-----------+----------+--------------+ FV Prox  Full                                                        +---------+---------------+---------+-----------+----------+--------------+ FV Mid   Full                                                        +---------+---------------+---------+-----------+----------+--------------+ FV DistalFull                                                        +---------+---------------+---------+-----------+----------+--------------+ PFV      Full                                                        +---------+---------------+---------+-----------+----------+--------------+ POP      Full           Yes      Yes                                 +---------+---------------+---------+-----------+----------+--------------+ PTV      Full                                                         +---------+---------------+---------+-----------+----------+--------------+  PERO     None                                         Acute          +---------+---------------+---------+-----------+----------+--------------+   +---------+---------------+---------+-----------+----------+-------------------+ LEFT     CompressibilityPhasicitySpontaneityPropertiesThrombus Aging      +---------+---------------+---------+-----------+----------+-------------------+ CFV      Full           Yes      Yes                                      +---------+---------------+---------+-----------+----------+-------------------+ SFJ      Full                                                             +---------+---------------+---------+-----------+----------+-------------------+ FV Prox  Full                                                             +---------+---------------+---------+-----------+----------+-------------------+ FV Mid   Full           Yes      Yes                                      +---------+---------------+---------+-----------+----------+-------------------+ FV DistalFull                                                             +---------+---------------+---------+-----------+----------+-------------------+ PFV      Full                                                             +---------+---------------+---------+-----------+----------+-------------------+ POP      Full           Yes      Yes                                      +---------+---------------+---------+-----------+----------+-------------------+ PTV      Full                                                             +---------+---------------+---------+-----------+----------+-------------------+ PERO  Not well visualized +---------+---------------+---------+-----------+----------+-------------------+     Summary:  RIGHT: - Findings consistent with acute deep vein thrombosis involving the right peroneal veins.   LEFT: - No evidence of deep vein thrombosis in the lower extremity. No indirect evidence of obstruction proximal to the inguinal ligament.   *See table(s) above for measurements and observations. Electronically signed by Runell Countryman on 10/31/2023 at 3:44:13 PM.    Final    CT Angio Chest Pulmonary Embolism (PE) W or WO Contrast Result Date: 10/31/2023 CLINICAL DATA:  Pulmonary embolism. EXAM: CT ANGIOGRAPHY CHEST WITH CONTRAST TECHNIQUE: Multidetector CT imaging of the chest was performed using the standard protocol during bolus administration of intravenous contrast. Multiplanar CT image reconstructions and MIPs were obtained to evaluate the vascular anatomy. RADIATION DOSE REDUCTION: This exam was performed according to the departmental dose-optimization program which includes automated exposure control, adjustment of the mA and/or kV according to patient size and/or use of iterative reconstruction technique. CONTRAST:  67mL OMNIPAQUE  IOHEXOL  350 MG/ML SOLN COMPARISON:  CT dated 10/26/2023. FINDINGS: Cardiovascular: There is no cardiomegaly or pericardial effusion. There is coronary vascular calcification of the LAD. There is calcification of the aortic valve. Mild atherosclerotic calcification of the thoracic aorta. No aneurysmal dilatation or dissection. Evaluation of the pulmonary arteries is limited due to respiratory motion. No central pulmonary artery embolus identified. Mediastinum/Nodes: No hilar or mediastinal adenopathy. An enteric tube noted in the esophagus extending into the stomach. No mediastinal fluid collection. Lungs/Pleura: Large area of consolidation involving the left lower lobe with air bronchograms. There is a small left pleural effusion. Small areas of subpleural densities in the right lower lobe and lingula may represent atelectasis or pneumonia. There is no pneumothorax. Endotracheal  tube approximately 3 cm above the carina. The central airways are patent. Upper Abdomen: Cirrhosis. Musculoskeletal: Osteopenia with degenerative changes spine. Compression fractures of the inferior T9 and superior T10 endplates as seen on the prior CT. No acute osseous pathology. Review of the MIP images confirms the above findings. IMPRESSION: 1. No CT evidence of central pulmonary artery embolus. 2. Left lower lobe pneumonia.  Follow-up to resolution recommended. 3. Cirrhosis. 4.  Aortic Atherosclerosis (ICD10-I70.0). Electronically Signed   By: Angus Bark M.D.   On: 10/31/2023 15:01   DG Chest Port 1 View Result Date: 10/31/2023 CLINICAL DATA:  Acute respiratory failure. EXAM: PORTABLE CHEST 1 VIEW COMPARISON:  10/29/2023 FINDINGS: Endotracheal tube in satisfactory position. Left jugular catheter tip in the superior vena cava. Stable dense left lower lobe airspace opacity. Interval small amount of linear atelectasis in the left mid lung zone. Clear right lung. Nasogastric tube extending into the stomach. Mild scoliosis. IMPRESSION: 1. Stable dense left lower lobe atelectasis or pneumonia. 2. Interval small amount of linear atelectasis in the left mid lung zone. 3. Support apparatus in satisfactory position. Electronically Signed   By: Catherin Closs M.D.   On: 10/31/2023 11:40   CT HEAD WO CONTRAST ( ) Result Date: 10/29/2023 CLINICAL DATA:  73 year old female with multifocal intracranial hemorrhage status post fall. EXAM: CT HEAD WITHOUT CONTRAST TECHNIQUE: Contiguous axial images were obtained from the base of the skull through the vertex without intravenous contrast. RADIATION DOSE REDUCTION: This exam was performed according to the departmental dose-optimization program which includes automated exposure control, adjustment of the mA and/or kV according to patient size and/or use of iterative reconstruction technique. COMPARISON:  Head CT yesterday and earlier. FINDINGS: Brain: There remains no  midline shift. Confluent hemorrhagic contusions in the right anterior and inferior  frontal and right anterior and lateral temporal lobes have not significantly changed. Small volume bilateral subarachnoid hemorrhage and questionable smaller left inferior temporal lobe hemorrhagic contusion (versus age coronal image 41) are stable. Superimposed hyperdense right anterior convexity small subdural hematoma and more broad-based but generally low-density left side subdural hematoma (4 mm) are stable. Confluent left inferior cerebellar hemorrhagic contusion with edema is stable. Basilar cisterns remain patent. Stable gray-white matter differentiation throughout the brain. No cortically based acute infarct identified. Vascular: Calcified atherosclerosis at the skull base. Skull: Stable nondisplaced left posterior convexity skull fracture extending to the left foramen magnum. Sinuses/Orbits: Visualized paranasal sinuses and mastoids are stable and well aerated. Other: Intubated and oral enteric tube partially visible. Left posterior convexity, right lateral convexity scalp hematomas which are not significantly changed. Orbits soft tissues appears stable and negative. Scalp EEG leads remain. IMPRESSION: 1. Stable extensive multifocal posttraumatic intracranial hemorrhage since yesterday. No midline shift. Basilar cisterns remain patent. 2. No new intracranial abnormality. 3. Stable nondisplaced left posterior skull fracture. Scalp hematomas. Electronically Signed   By: Marlise Simpers M.D.   On: 10/29/2023 09:24   DG Chest Port 1 View Result Date: 10/29/2023 CLINICAL DATA:  [id number]. Acute respiratory failure, ventilator dependent. EXAM: PORTABLE CHEST 1 VIEW COMPARISON:  Portable chest yesterday at 8:13 a.m. FINDINGS: 4:13 a.m. ETT tip is 5.4 cm from the carina, previously 4.1 cm. Left IJ line terminates at the superior cavoatrial junction. NGT enters the stomach with the intragastric course not evaluated. The heart is slightly  enlarged. No vascular congestion is seen. Stable mediastinum with aortic atherosclerosis. Dense left lower lobe consolidation continues to be seen and there are bilateral minimal pleural effusions. The lungs are otherwise clear. Overall aeration seems unchanged. Osteopenia and thoracic spondylosis. IMPRESSION: 1. ETT tip 5.4 cm from the carina, previously 4.1 cm. 2. Left IJ line terminates at the superior cavoatrial junction. 3. NGT enters the stomach with the intragastric course not evaluated. 4. Dense left lower lobe consolidation and bilateral minimal pleural effusions. 5. Aortic atherosclerosis. Electronically Signed   By: Denman Fischer M.D.   On: 10/29/2023 04:46   CT HEAD WO CONTRAST ( ) Result Date: 10/28/2023 CLINICAL DATA:  73 year old female with multifocal intracranial hemorrhage, status post fall. EXAM: CT HEAD WITHOUT CONTRAST TECHNIQUE: Contiguous axial images were obtained from the base of the skull through the vertex without intravenous contrast. RADIATION DOSE REDUCTION: This exam was performed according to the departmental dose-optimization program which includes automated exposure control, adjustment of the mA and/or kV according to patient size and/or use of iterative reconstruction technique. COMPARISON:  Head CTs yesterday and earlier. FINDINGS: Brain: Right side anterior frontal convexity predominant mostly hyperdense subdural hematoma is 5-6 mm, stable. Superimposed confluent right inferior frontal gyrus and right anterior temporal lobe hemorrhagic contusions, in an area of slightly larger than 4 cm, stable. Surrounding edema stable. Bilateral subarachnoid hemorrhage, most apparent along the sylvian fissures, asymmetrically greater on the right, stable. Left inferolateral cerebellar hemorrhagic contusion which is about 2.5 cm, stable. Small volume hyperdense blood layering on the tentorium, stable. A smaller predominantly low-density left side subdural hematoma measuring up to 4 mm is  new since presentation and increased since yesterday, most apparent on coronal image 47. Small volume IVH limited to the 4th ventricle is stable. No ventriculomegaly. No significant midline shift. Basilar cisterns remain patent. Stable gray-white matter differentiation throughout the brain. No cortically based acute infarct identified. Vascular: Calcified atherosclerosis at the skull base. No suspicious intracranial vascular hyperdensity. Skull: Linear  nondisplaced skull fracture tracking lateral to the left lambdoid suture from the inferior parietal bone region through to the left foramen magnum. No new osseous abnormality identified. Sinuses/Orbits: Visualized paranasal sinuses and mastoids are stable and well aerated. Other: Large, broad-based left posterior convexity scalp hematoma does not appear significantly changed. Underlying nondisplaced skull fracture. Orbits soft tissues appears stable.  Bilateral scalp EEG leads now. IMPRESSION: 1. Extensive multifocal posttraumatic intracranial hemorrhage, stable since yesterday with EXCEPT FOR: Small 4 mm mixed but predominantly low-density left Subdural Hematoma has increased (coronal image 46). 2. No midline shift or significant intracranial mass effect despite the findings. No ventriculomegaly. Stable basilar cisterns. 3. Nondisplaced left posterior convexity skull fracture with large overlying scalp hematoma. Electronically Signed   By: Marlise Simpers M.D.   On: 10/28/2023 10:02   DG CHEST PORT 1 VIEW Result Date: 10/28/2023 CLINICAL DATA:  [id number] Endotracheal tube present [id number] EXAM: PORTABLE CHEST 1 VIEW COMPARISON:  10/28/2023, 6:19 a.m. FINDINGS: Re-demonstration of left retrocardiac airspace opacity obscuring the left hemidiaphragm, descending thoracic aorta and blunting the left lateral costophrenic angle, suggesting combination of left lung atelectasis and/or consolidation with pleural effusion. No significant interval change. Bilateral lung fields are  clear. Bilateral costophrenic angles are clear. Stable cardio-mediastinal silhouette. No acute osseous abnormalities. The soft tissues are within normal limits. Tube/lines: Endotracheal tube is seen with its tip approximately 4.1 cm above the carina. Enteric tube is seen coursing below the left hemidiaphragm however, the tip and side hole are not included in the film. Left IJ central venous catheter noted with its tip overlying the cavoatrial junction region. IMPRESSION: 1. Repositioning of the endotracheal tube. 2. Otherwise, no significant interval change since the prior study. Stable left retrocardiac opacity, as described above. 3. Support apparatus as described above. Electronically Signed   By: Beula Brunswick M.D.   On: 10/28/2023 08:27   DG Abd Portable 1V Result Date: 10/28/2023 CLINICAL DATA:  [id number] Encounter for orogastric (OG) tube placement [id number]. EXAM: PORTABLE ABDOMEN - 1 VIEW COMPARISON:  None Available. FINDINGS: The bowel gas pattern is non-obstructive. No evidence of pneumoperitoneum, within the limitations of a supine film. No acute osseous abnormalities. The soft tissues are within normal limits. Surgical changes, devices, tubes and lines: Enteric tube is seen coursing below the left hemidiaphragm with its side hole overlying the left side of the lumbar spine (stomach body region) and tip overlying the right lower quadrant, overlying the region of pylorus of the stomach. IMPRESSION: *Enteric tube is seen coursing below the left hemidiaphragm with its side hole overlying the left side of the lumbar spine (stomach body region) and tip overlying the right lower quadrant, overlying the region of pylorus of the stomach. Electronically Signed   By: Beula Brunswick M.D.   On: 10/28/2023 08:25   DG Chest Port 1 View Addendum Date: 10/28/2023 ADDENDUM REPORT: 10/28/2023 07:19 ADDENDUM: Critical Value/emergent results were called by telephone at the time of interpretation on 10/28/2023 at 7:18 am to  provider Dr. Waylan Haggard, who verbally acknowledged these results. Electronically Signed   By: Donnal Fusi M.D.   On: 10/28/2023 07:19   Result Date: 10/28/2023 CLINICAL DATA:  Acute intracranial hemorrhage. Ventilator dependence. EXAM: PORTABLE CHEST 1 VIEW COMPARISON:  Earlier same day. FINDINGS: Left base collapse/consolidation with small left pleural effusion is similar to prior. Endotracheal tube tip is in the right mainstem bronchus, about 17 mm below the bottom of the carina. Endotracheal tube could be retracted approximately 4 cm to place the tip in  the distal esophagus. Left IJ central line tip overlies the distal SVC level. The NG tube passes into the stomach although the distal tip position is not included on the film. Telemetry leads overlie the chest. IMPRESSION: 1. Endotracheal tube tip is in the right mainstem bronchus, about 17 mm below the bottom of the carina. Endotracheal tube could be retracted approximately 4 cm to place the tip in the distal esophagus. Repeat chest x-ray recommended after endotracheal tube repositioning. 2. Left base collapse/consolidation with small left pleural effusion, similar to prior. Electronically Signed: By: Donnal Fusi M.D. On: 10/28/2023 06:49   Overnight EEG with video Result Date: 10/28/2023 Arleene Lack, MD     10/29/2023  6:46 AM Patient Name: Miranda Cochran MRN: [id number] Epilepsy Attending: Arleene Lack Referring Physician/Provider: Josiah Nigh, MD Duration: 10/27/2023 1319 to 10/28/2023 1319 Patient history: 72yo F with right occipital fracture with acute subarachnoid hemorrhage, multiple contusions and a right subdural hematoma. EEG to evaluate for seizure Level of alertness: comatose/ lethargic AEDs during EEG study: LEV, propofol  Technical aspects: This EEG study was done with scalp electrodes positioned according to the 10-20 International system of electrode placement. Electrical activity was reviewed with band pass filter of 1-70Hz ,  sensitivity of 7 uV/mm, display speed of 51mm/sec with a 60Hz  notched filter applied as appropriate. EEG data were recorded continuously and digitally stored.  Video monitoring was available and reviewed as appropriate. Description: EEG showed continuous generalized and lateralized right hemisphere 3 to 6 Hz theta-delta slowing with overriding 12-14hz  beta activity. Hyperventilation and photic stimulation were not performed.   ABNORMALITY - Continuous slow, generalized and lateralized right hemisphere IMPRESSION: This study is suggestive of cortical dysfunction arising from right hemisphere likely secondary to underlying structural abnormality. Additionally there is moderate to severe diffuse encephalopathy likely related to sedation. No seizures or epileptiform discharges were seen throughout the recording. Priyanka Suzanne Erps   CT HEAD POST STROKE FOLLOWUP/TIMED/STAT READ Result Date: 10/27/2023 CLINICAL DATA:  Neuro deficit, acute, stroke suspected. EXAM: CT HEAD WITHOUT CONTRAST TECHNIQUE: Contiguous axial images were obtained from the base of the skull through the vertex without intravenous contrast. RADIATION DOSE REDUCTION: This exam was performed according to the departmental dose-optimization program which includes automated exposure control, adjustment of the mA and/or kV according to patient size and/or use of iterative reconstruction technique. COMPARISON:  Prior head CT examinations 10/27/2023 and earlier. FINDINGS: Brain: An acute subdural hematoma along the right cerebral convexity has undergone some interval redistribution but has not significantly changed in overall extent. The hematoma now measures up to 8 mm in thickness, and as before, is greatest along the right frontal lobe. Continued slight interval increase in extent of acute subarachnoid hemorrhage scattered along the right cerebral convexity. Progressive edema at sites of hemorrhagic parenchymal contusions within the anteroinferior right  frontal lobe and anterior right temporal lobe. Small-volume subarachnoid hemorrhage scattered along the left cerebral convexity has mildly increased. Possible small underlying hemorrhagic parenchymal contusions within the anterolateral left temporal lobe, as before. Thin acute subdural hemorrhage layering along the tentorium bilaterally, unchanged. 2.3 x 2.6 cm acute parenchymal hemorrhage within the left cerebellar hemisphere, unchanged in size. Trace subarachnoid hemorrhage again questioned at the inferior aspect of the fourth ventricle (versus choroid plexus) (for instance as seen on series 5, image 28). No midline shift or hydrocephalus. Vascular: No hyperdense vessel.  Atherosclerotic calcifications. Skull: Known non-depressed fracture of the left occipital calvarium. Sinuses/Orbits: No mass or acute finding within the imaged orbits. Minimal  mucosal thickening within the bilateral maxillary sinuses at the imaged levels. Minimal mucosal thickening within the bilateral sphenoid and right ethmoid sinuses. Other: Posterior scalp hematomas. IMPRESSION: 1. An acute subdural hematoma along the right cerebral convexity has undergone some interval redistribution but has not significantly changed in overall extent. 2. Continued slight interval increase in extent of acute subarachnoid hemorrhage scattered along the right cerebral convexity. 3. Progressive edema at sites of hemorrhagic parenchymal contusions within the anteroinferior right frontal lobe and anterior right temporal lobe. 4. Small-volume subarachnoid hemorrhage scattered along the left cerebral convexity has mildly increased. Possible small underlying hemorrhagic parenchymal contusions within the anterolateral left temporal lobe, as before. 5. Thin acute subdural hemorrhage layering along the tentorium bilaterally, unchanged. 6. 2.3 x 2.6 cm acute parenchymal hemorrhage within the left cerebellar hemisphere, unchanged. 7. Trace subarachnoid hemorrhage again  questioned at the inferior aspect of the fourth ventricle. 8. No midline shift or hydrocephalus. 9. Known non-depressed fracture of the left occipital calvarium. Electronically Signed   By: Bascom Lily D.O.   On: 10/27/2023 16:39   ECHOCARDIOGRAM COMPLETE Result Date: 10/27/2023    ECHOCARDIOGRAM REPORT   Patient Name:   Miranda Cochran Date of Exam: 10/27/2023 Medical Rec #:  [id number]     Height:       64.0 in Accession #:    [id number]    Weight:       121.3 lb Date of Birth:  November 24, 1950    BSA:          1.582 m Patient Age:    72 years      BP:           186/91 mmHg Patient Gender: F             HR:           103 bpm. Exam Location:  Inpatient Procedure: 2D Echo, Cardiac Doppler and Color Doppler (Both Spectral and Color            Flow Doppler were utilized during procedure). Indications:    R55 Syncope  History:        Patient has prior history of Echocardiogram examinations, most                 recent 11/11/2020. Stroke; Risk Factors:Hypertension,                 Dyslipidemia and Diabetes. SDH.  Sonographer:    Raynelle Callow RDCS Referring Phys: [phone] Tania Familia [facility]  Sonographer Comments: Technically difficult study due to poor echo windows and echo performed with patient supine and on artificial respirator. Patient with EEG adhesive leads in parasternal region. Patient intubated, restrained and moving throughout exam. IMPRESSIONS  1. Left ventricular ejection fraction, by estimation, is 65 to 70%. The left ventricle has normal function. The left ventricle has no regional wall motion abnormalities. There is severe left ventricular hypertrophy of the basal-septal segment. Left ventricular diastolic parameters are indeterminate. Elevated left atrial pressure.  2. Right ventricular systolic function is normal. The right ventricular size is normal. There is normal pulmonary artery systolic pressure. The estimated right ventricular systolic pressure is 28.1 mmHg.  3. The mitral valve is normal in structure.  Trivial mitral valve regurgitation. No evidence of mitral stenosis.  4. The aortic valve has an indeterminant number of cusps. There is moderate calcification of the aortic valve. There is moderate thickening of the aortic valve. Aortic valve regurgitation is not visualized. Moderate aortic valve stenosis. Aortic valve  area, by VTI measures 1.10 cm. Aortic valve mean gradient measures 32.0 mmHg. Aortic valve Vmax measures 3.26 m/s.  5. The inferior vena cava is dilated in size with >50% respiratory variability, suggesting right atrial pressure of 8 mmHg. FINDINGS  Left Ventricle: Left ventricular ejection fraction, by estimation, is 65 to 70%. The left ventricle has normal function. The left ventricle has no regional wall motion abnormalities. The left ventricular internal cavity size was normal in size. There is  severe left ventricular hypertrophy of the basal-septal segment. Left ventricular diastolic parameters are indeterminate. Elevated left atrial pressure. Right Ventricle: The right ventricular size is normal. No increase in right ventricular wall thickness. Right ventricular systolic function is normal. There is normal pulmonary artery systolic pressure. The tricuspid regurgitant velocity is 2.24 m/s, and  with an assumed right atrial pressure of 8 mmHg, the estimated right ventricular systolic pressure is 28.1 mmHg. Left Atrium: Left atrial size was normal in size. Right Atrium: Right atrial size was normal in size. Pericardium: There is no evidence of pericardial effusion. Mitral Valve: The mitral valve is normal in structure. Mild to moderate mitral annular calcification. Trivial mitral valve regurgitation. No evidence of mitral valve stenosis. MV peak gradient, 6.2 mmHg. The mean mitral valve gradient is 3.0 mmHg. Tricuspid Valve: The tricuspid valve is normal in structure. Tricuspid valve regurgitation is not demonstrated. No evidence of tricuspid stenosis. Aortic Valve: The aortic valve has an  indeterminant number of cusps. There is moderate calcification of the aortic valve. There is moderate thickening of the aortic valve. Aortic valve regurgitation is not visualized. Moderate aortic stenosis is present.  Aortic valve mean gradient measures 32.0 mmHg. Aortic valve peak gradient measures 42.6 mmHg. Aortic valve area, by VTI measures 1.10 cm. Pulmonic Valve: The pulmonic valve was normal in structure. Pulmonic valve regurgitation is not visualized. No evidence of pulmonic stenosis. Aorta: The aortic root is normal in size and structure. Venous: The inferior vena cava is dilated in size with greater than 50% respiratory variability, suggesting right atrial pressure of 8 mmHg. IAS/Shunts: No atrial level shunt detected by color flow Doppler.  LEFT VENTRICLE PLAX 2D LVIDd:         3.70 cm     Diastology LVIDs:         2.40 cm     LV e' medial:    4.57 cm/s LV PW:         0.70 cm     LV E/e' medial:  20.0 LV IVS:        1.80 cm     LV e' lateral:   5.66 cm/s LVOT diam:     2.10 cm     LV E/e' lateral: 16.2 LV SV:         60 LV SV Index:   38 LVOT Area:     3.46 cm  LV Volumes (MOD) LV vol d, MOD A2C: 77.1 ml LV vol d, MOD A4C: 68.4 ml LV vol s, MOD A2C: 21.9 ml LV vol s, MOD A4C: 25.9 ml LV SV MOD A2C:     55.2 ml LV SV MOD A4C:     68.4 ml LV SV MOD BP:      53.0 ml RIGHT VENTRICLE             IVC RV S prime:     14.70 cm/s  IVC diam: 2.10 cm TAPSE (M-mode): 2.1 cm LEFT ATRIUM  Index        RIGHT ATRIUM           Index LA diam:        3.30 cm 2.09 cm/m   RA Area:     10.80 cm LA Vol (A2C):   21.5 ml 13.59 ml/m  RA Volume:   21.60 ml  13.66 ml/m LA Vol (A4C):   40.5 ml 25.61 ml/m LA Biplane Vol: 30.7 ml 19.41 ml/m  AORTIC VALVE AV Area (Vmax):    1.18 cm AV Area (Vmean):   1.09 cm AV Area (VTI):     1.10 cm AV Vmax:           326.25 cm/s AV Vmean:          232.750 cm/s AV VTI:            0.543 m AV Peak Grad:      42.6 mmHg AV Mean Grad:      32.0 mmHg LVOT Vmax:         111.00 cm/s LVOT  Vmean:        73.100 cm/s LVOT VTI:          0.172 m LVOT/AV VTI ratio: 0.32  AORTA Ao Root diam: 3.50 cm Ao Asc diam:  3.45 cm MITRAL VALVE                TRICUSPID VALVE MV Area (PHT): 4.37 cm     TR Peak grad:   20.1 mmHg MV Area VTI:   2.64 cm     TR Vmax:        224.00 cm/s MV Peak grad:  6.2 mmHg MV Mean grad:  3.0 mmHg     SHUNTS MV Vmax:       1.25 m/s     Systemic VTI:  0.17 m MV Vmean:      79.1 cm/s    Systemic Diam: 2.10 cm MV Decel Time: 174 msec MV E velocity: 91.50 cm/s MV A velocity: 113.67 cm/s MV E/A ratio:  0.80 Gaylyn Keas MD Electronically signed by Gaylyn Keas MD Signature Date/Time: 10/27/2023/3:21:35 PM    Final    DG Abdomen 1 View Result Date: 10/27/2023 CLINICAL DATA:  OG tube placement EXAM: ABDOMEN - 1 VIEW COMPARISON:  Oct 26, 2023 FINDINGS: Esophagogastric tube terminates in the stomach. Nonobstructive bowel gas pattern. No pneumoperitoneum. No organomegaly or radiopaque calculi. Multilevel thoracic osteophytosis. Atherosclerosis. IMPRESSION: Well-positioned esophagogastric tube terminates in the stomach. Electronically Signed   By: Rance Burrows M.D.   On: 10/27/2023 12:04   DG Chest Portable 1 View Result Date: 10/27/2023 CLINICAL DATA:  ETT tube placement EXAM: PORTABLE CHEST - 1 VIEW COMPARISON:  Oct 26, 2023 FINDINGS: Suboptimal evaluation of the endotracheal tube due to overlying cardiac wires. It appears that the tip of the endotracheal tube is within the ostium of the right mainstem bronchus. Left IJ approach central venous catheter terminates at the cavoatrial junction. Esophagogastric tube courses below the diaphragm with the distal tip not included in the field of view. The last side hole overlies the expected region of the stomach. Trace left pleural effusion. Streaky left basilar atelectasis. No pneumothorax. No cardiomegaly. Tortuous aorta with aortic atherosclerosis. IMPRESSION: Low lying endotracheal tube, which is suboptimally evaluated due to overlying  defibrillator pad and cardiac wires. It appears that the tip of the endotracheal tube is within the ostium of the right mainstem bronchus. Repeat chest radiograph recommended for further characterization after 2 cm of retraction. Well-positioned  left IJ central venous catheter.  No pneumothorax. Electronically Signed   By: Rance Burrows M.D.   On: 10/27/2023 12:03   CT HEAD WO CONTRAST ( ) Addendum Date: 10/27/2023 ADDENDUM REPORT: 10/27/2023 09:03 ADDENDUM: These results were called by telephone at the time of interpretation on 10/27/2023 at 9:03 am to provider Dr. Nichole Barker, who verbally acknowledged these results. Electronically Signed   By: Bascom Lily D.O.   On: 10/27/2023 09:03   Result Date: 10/27/2023 CLINICAL DATA:  Head trauma, intracranial arterial injury suspected. EXAM: CT HEAD WITHOUT CONTRAST TECHNIQUE: Contiguous axial images were obtained from the base of the skull through the vertex without intravenous contrast. RADIATION DOSE REDUCTION: This exam was performed according to the departmental dose-optimization program which includes automated exposure control, adjustment of the mA and/or kV according to patient size and/or use of iterative reconstruction technique. COMPARISON:  Head CT 10/26/2023. FINDINGS: Brain: Interval increase in size of an acute subdural hematoma along the right cerebral convexity (greatest along the anterior right frontal lobe), now measuring up to 9 mm in thickness (previously 7 mm). Acute subarachnoid hemorrhage scattered along the right cerebral convexity, increased. Hemorrhagic parenchymal contusions within the anterior right temporal lobe and anteroinferior right frontal lobe have increased in size. For instance, the largest hemorrhagic parenchymal contusion within the anterior right temporal lobe now measures 18 mm (previously 11 mm) (series 2, image 16). Trace acute subarachnoid hemorrhage is present along the anterolateral left temporal lobe, new from the prior  exam (series 2, image 15). Possible small underlying left temporal lobe hemorrhagic parenchymal contusions. Thin subdural hemorrhage layering along the tentorium, bilaterally, similar to the prior exam. A 2.3 x 2.6 cm hemorrhagic parenchymal contusion within the left cerebellar hemisphere has increased in size (previously 2.1 x 1.8 cm). Trace subarachnoid hemorrhage questioned within the inferior aspect of the fourth ventricle (versus prominent choroid plexus). No midline shift or hydrocephalus. Vascular: No hyperdense vessel.  Atherosclerotic calcifications. Skull: Non-depressed fracture of the left occipital calvarium again noted. Sinuses/Orbits: No orbital mass or acute orbital finding. Mild mucosal thickening within the right ethmoid and left maxillary sinuses. Other: Left occipital scalp hematoma and laceration. Attempts are being made to reach the ordering provider at this time. IMPRESSION: 1. Interval increase in size of an acute subdural hematoma along the right cerebral convexity (greatest along the anterior right frontal lobe), now measuring up to 9 mm in thickness (previously 7 mm). 2. Acute subarachnoid hemorrhage scattered along the right cerebral convexity, increased. 3. Hemorrhagic parenchymal contusions within the anterior right temporal lobe and anteroinferior right frontal lobe have increased in size. 4. Trace acute subarachnoid hemorrhage along the anterolateral left temporal lobe, new from the prior exam. Possible small underlying left temporal lobe hemorrhagic parenchymal contusions. 5. A 2.3 x 2.6 cm hemorrhagic parenchymal contusion within the left cerebellar hemisphere has increased in size (previously 2.1 x 1.8 cm). 6. Thin subdural hemorrhage layering along the tentorium, bilaterally, similar to the prior exam. 7. Trace subarachnoid hemorrhage (versus prominent choroid plexus) within the inferior aspect of the fourth ventricle. 8. Non-depressed fracture of the left occipital calvarium. 9.  Left occipital scalp hematoma and laceration. Electronically Signed: By: Bascom Lily D.O. On: 10/27/2023 08:44   CT HEAD WO CONTRAST Result Date: 10/26/2023 CLINICAL DATA:  Fall EXAM: CT HEAD WITHOUT CONTRAST CT CERVICAL SPINE WITHOUT CONTRAST TECHNIQUE: Multidetector CT imaging of the head and cervical spine was performed following the standard protocol without intravenous contrast. Multiplanar CT image reconstructions of the cervical spine were also  generated. RADIATION DOSE REDUCTION: This exam was performed according to the departmental dose-optimization program which includes automated exposure control, adjustment of the mA and/or kV according to patient size and/or use of iterative reconstruction technique. COMPARISON:  None Available. FINDINGS: CT HEAD FINDINGS Brain: There is mixed subdural and subarachnoid hemorrhage of the right hemisphere, predominantly anterior. There is a small amount of subdural blood layering along the falx cerebrum. No midline shift or other mass effect. No hydrocephalus. Vascular: Atherosclerotic calcification of the vertebral and internal carotid arteries at the skull base. No abnormal hyperdensity of the major intracranial arteries or dural venous sinuses. Skull: Large left posterior scalp hematoma. Nondisplaced left occipital skull fracture. Sinuses/Orbits: No fluid levels or advanced mucosal thickening of the visualized paranasal sinuses. No mastoid or middle ear effusion. Normal orbits. Other: None. CT CERVICAL SPINE FINDINGS Alignment: Grade 1 anterolisthesis at C4-5. Skull base and vertebrae: No acute fracture. Soft tissues and spinal canal: No prevertebral fluid or swelling. No visible canal hematoma. Disc levels: No advanced spinal canal or neural foraminal stenosis. Upper chest: No pneumothorax, pulmonary nodule or pleural effusion. Other: Normal visualized paraspinal cervical soft tissues. IMPRESSION: 1. Mixed subdural and subarachnoid hemorrhage of the right  hemisphere, predominantly anterior. No midline shift or other mass effect. 2. Nondisplaced left occipital skull fracture with large left posterior scalp hematoma. 3. No acute fracture or static subluxation of the cervical spine. Critical Value/emergent results were called by telephone at the time of interpretation on 10/26/2023 at 11:50 pm to provider JONATHAN GILLIAM , who verbally acknowledged these results. Electronically Signed   By: Juanetta Nordmann M.D.   On: 10/26/2023 23:51   CT CERVICAL SPINE WO CONTRAST Result Date: 10/26/2023 CLINICAL DATA:  Fall EXAM: CT HEAD WITHOUT CONTRAST CT CERVICAL SPINE WITHOUT CONTRAST TECHNIQUE: Multidetector CT imaging of the head and cervical spine was performed following the standard protocol without intravenous contrast. Multiplanar CT image reconstructions of the cervical spine were also generated. RADIATION DOSE REDUCTION: This exam was performed according to the departmental dose-optimization program which includes automated exposure control, adjustment of the mA and/or kV according to patient size and/or use of iterative reconstruction technique. COMPARISON:  None Available. FINDINGS: CT HEAD FINDINGS Brain: There is mixed subdural and subarachnoid hemorrhage of the right hemisphere, predominantly anterior. There is a small amount of subdural blood layering along the falx cerebrum. No midline shift or other mass effect. No hydrocephalus. Vascular: Atherosclerotic calcification of the vertebral and internal carotid arteries at the skull base. No abnormal hyperdensity of the major intracranial arteries or dural venous sinuses. Skull: Large left posterior scalp hematoma. Nondisplaced left occipital skull fracture. Sinuses/Orbits: No fluid levels or advanced mucosal thickening of the visualized paranasal sinuses. No mastoid or middle ear effusion. Normal orbits. Other: None. CT CERVICAL SPINE FINDINGS Alignment: Grade 1 anterolisthesis at C4-5. Skull base and vertebrae: No  acute fracture. Soft tissues and spinal canal: No prevertebral fluid or swelling. No visible canal hematoma. Disc levels: No advanced spinal canal or neural foraminal stenosis. Upper chest: No pneumothorax, pulmonary nodule or pleural effusion. Other: Normal visualized paraspinal cervical soft tissues. IMPRESSION: 1. Mixed subdural and subarachnoid hemorrhage of the right hemisphere, predominantly anterior. No midline shift or other mass effect. 2. Nondisplaced left occipital skull fracture with large left posterior scalp hematoma. 3. No acute fracture or static subluxation of the cervical spine. Critical Value/emergent results were called by telephone at the time of interpretation on 10/26/2023 at 11:50 pm to provider JONATHAN GILLIAM , who verbally acknowledged these  results. Electronically Signed   By: Juanetta Nordmann M.D.   On: 10/26/2023 23:51   CT CHEST ABDOMEN PELVIS W CONTRAST Result Date: 10/26/2023 CLINICAL DATA:  Blunt trauma, loss of balance, fell backwards and hit head, hypertension EXAM: CT CHEST, ABDOMEN, AND PELVIS WITH CONTRAST TECHNIQUE: Multidetector CT imaging of the chest, abdomen and pelvis was performed following the standard protocol during bolus administration of intravenous contrast. RADIATION DOSE REDUCTION: This exam was performed according to the departmental dose-optimization program which includes automated exposure control, adjustment of the mA and/or kV according to patient size and/or use of iterative reconstruction technique. CONTRAST:  75mL OMNIPAQUE  IOHEXOL  350 MG/ML SOLN COMPARISON:  06/16/2016, 06/05/2023 FINDINGS: CT CHEST FINDINGS Cardiovascular: The heart is enlarged without pericardial effusion. No evidence of thoracic aortic aneurysm or dissection. No evidence of vascular injury. Atherosclerosis of the aorta and coronary vasculature. Mediastinum/Nodes: No enlarged mediastinal, hilar, or axillary lymph nodes. Thyroid  gland, trachea, and esophagus demonstrate no significant  findings. Lungs/Pleura: No acute airspace disease, effusion, or pneumothorax. Central airways are patent. Musculoskeletal: There is an acute appearing fracture through the superior endplate of the T10 vertebral body, with less than 10% loss of height. No retropulsion. No other acute bony abnormalities. Chronic appearing compression deformity within the inferior endplate of the T9 vertebral body. Reconstructed images demonstrate no additional findings. CT ABDOMEN PELVIS FINDINGS Hepatobiliary: Nodular contour of the liver consistent with cirrhosis. No focal parenchymal liver abnormality. No evidence of hepatic injury. The gallbladder is unremarkable. No biliary duct dilation. Pancreas: Unremarkable. No pancreatic ductal dilatation or surrounding inflammatory changes. Spleen: The spleen is enlarged, measuring 11.8 x 11.7 x 6.0 cm. No focal parenchymal abnormality. Adrenals/Urinary Tract: No adrenal hemorrhage or renal injury identified. Bladder is unremarkable. Stomach/Bowel: No bowel obstruction or ileus. No bowel wall thickening or inflammatory change. Vascular/Lymphatic: Aortic atherosclerosis. No enlarged abdominal or pelvic lymph nodes. Reproductive: Multiple calcified uterine fibroids. No adnexal masses. Other: No free fluid or free intraperitoneal gas. Small fat containing umbilical hernia. Musculoskeletal: Chronic appearing compression deformities are seen at T12, L2, L3, L4, and L5, with no significant change since the prior exam. Reconstructed images demonstrate no additional findings. IMPRESSION: 1. Acute appearing compression deformity through the superior endplate of the T10 vertebral body, with less than 10% loss of vertebral body height. No retropulsion. 2. Otherwise no acute intrathoracic, intra-abdominal, or intrapelvic trauma. 3. 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 4. Other chronic thoracolumbar compression deformities as above. 5.  Aortic Atherosclerosis  (ICD10-I70.0). 6. Cardiomegaly without pericardial effusion. Electronically Signed   By: Bobbye Burrow M.D.   On: 10/26/2023 23:38   DG Chest Port 1 View Result Date: 10/26/2023 CLINICAL DATA:  Fall, chest are EXAM: PORTABLE CHEST 1 VIEW COMPARISON:  None Available. FINDINGS: Lungs are well expanded, symmetric, and clear. No pneumothorax or pleural effusion. Cardiac size within normal limits. Pulmonary vascularity is normal. Osseous structures are age-appropriate. No acute bone abnormality. IMPRESSION: 1. No active disease. Electronically Signed   By: Worthy Heads M.D.   On: 10/26/2023 22:26    Labs:  CBC: Recent Labs    10/28/23 0443 10/29/23 0337 10/29/23 0408 10/30/23 0425 10/31/23 0449  WBC 11.8* 15.5*  --  20.9* 13.7*  HGB 9.7* 9.2* 9.9* 9.5* 9.9*  HCT 28.6* 29.3* 29.0* 27.6* 30.7*  PLT 138* 182  --  190 179    COAGS: Recent Labs    10/26/23 2150 10/27/23 2204  INR 1.1 1.2  APTT  --  22*    BMP:  Recent Labs    10/28/23 1600 10/29/23 0337 10/29/23 0408 10/30/23 0425 10/31/23 0449  NA 137 135 134* 136 143  K 4.6 3.2* 3.4* 3.6 3.6  CL 108 107  --  106 109  CO2 22 19*  --  22 23  GLUCOSE 206* 247*  --  134* 156*  BUN 7* 14  --  16 10  CALCIUM  7.0* 6.5*  --  6.9* 7.5*  CREATININE 0.47 0.64  --  0.44 0.44  GFRNONAA >60 >60  --  >60 >60    LIVER FUNCTION TESTS: Recent Labs    10/28/23 0443 10/29/23 0337 10/30/23 0425 10/31/23 0449  BILITOT 1.3* 1.5* 1.2 0.9  AST 22 24 27 29   ALT 16 14 17 19   ALKPHOS 72 68 76 94  PROT 5.4* 4.9* 5.2* 6.1*  ALBUMIN 2.6* 2.1* 2.1* 2.2*    TUMOR MARKERS: No results for input(s): "AFPTM", "CEA", "CA199", "CHROMGRNA" in the last 8760 hours.  Assessment and Plan:  JOSCELYNN BRUTUS is a 73 y.o. female currently admitted for TBI, ICH status post fall. Initially presented to [facility] ED on 10/26/23 after fall at home, hitting her head. A left occipital fracture and right subdural hematoma was seen on CT imaging and  neurosurgery consult was initiated who wanted to observe for a few days. She had initially been alert and oriented with acute mental status change while still in the ED prior to getting a room on admission. Has since been in partial coma and responsive only to pain. Had US  of lower extremities showing RLE DVT today. Due to ICH and inability to anticoagulate, IR has been consulted for IVC filter placement.   Risks and benefits discussed with the patient's husband including, but not limited to bleeding, infection, contrast induced renal failure, filter fracture or migration which can lead to emergency surgery or even death, strut penetration with damage or irritation to adjacent structures and caval thrombosis.  All of the patient's husbands questions were answered, patient's husband is agreeable to proceed. Consent completed with phone verbal consent from husband and in chart.   Thank you for allowing our service to participate in Miranda Cochran 's care.  Electronically Signed: Nicolasa Barrett, PA-C   10/31/2023, 3:53 PM      I spent a total of 40 Minutes    in face to face in clinical consultation, greater than 50% of which was counseling/coordinating care for inferior vena cava filter placement.

## 2023-10-31 NOTE — Progress Notes (Signed)
 VASCULAR LAB    Bilateral lower extremity venous duplex has been performed.  See CV proc for preliminary results.  Relayed results verbally to Dr. Waylan Haggard and Myer Artis, RN via secure chat  Carleene Chase, RVT 10/31/2023, 11:16 AM

## 2023-10-31 NOTE — Inpatient Diabetes Management (Signed)
 Inpatient Diabetes Program Recommendations  AACE/ADA: New Consensus Statement on Inpatient Glycemic Control (2015)  Target Ranges:  Prepandial:   less than 140 mg/dL      Peak postprandial:   less than 180 mg/dL (1-2 hours)      Critically ill patients:  140 - 180 mg/dL   Lab Results  Component Value Date   GLUCAP 187 (H) 10/31/2023   HGBA1C 6.8 (H) 10/27/2023    Review of Glycemic Control  Diabetes history: DM 2 Outpatient Diabetes medications: Humalog  18-22 units at lunch, Rybelsus 14 mg Daily Current orders for Inpatient glycemic control:  Novolog  0-20 units Q4 hours Semglee  12 units QD Solucortef 100 mg Daily-changing to Prednisone  10 mg every day on 11/01/23  Inpatient Diabetes Program Recommendations:    Might consider:  Novolog  3 units Q4H tube feed coverage (hold if feeds are held or discontinued or if glucose is <80 mg/dL).   Thank you, Hays Lipschutz, MSN, CDCES Diabetes Coordinator Inpatient Diabetes Program [phone] (team pager from 8a-5p)

## 2023-10-31 NOTE — Progress Notes (Signed)
 PCCM note  Right lower extremity DVT Discussed with neurology and neurosurgery.  Unable to anticoagulate due to TBI, ICH Consult IR for IVC placement Obtain CTA to look for pulmonary embolism  Phyllis Breeze MD  Pulmonary & Critical care See Amion for pager  If no response to pager , please call [phone] until 7pm After 7:00 pm call Elink  [phone] 10/31/2023, 2:04 PM

## 2023-10-31 NOTE — Progress Notes (Signed)
   10/31/23 0737  Daily Weaning Assessment  Daily Assessment of Readiness to Wean Wean protocol criteria met (SBT performed)  SBT Method CPAP 5 cm H20 and PS 5 cm H20 (PS 8)  Weaning Start Time 0737   Pt was placed on CPAP/PS 8/5 40% and is tolerating well at this time.

## 2023-10-31 NOTE — Progress Notes (Signed)
 Daily Progress Note   Patient Name: Miranda Cochran       Date: 10/31/2023 DOB: [birth year]  Age: 73 y.o. MRN#: [id number] Attending Physician: Mannam, Praveen, MD Primary Care Physician: Donley Furth, MD Admit Date: 10/26/2023  Reason for Consultation/Follow-up: Establishing goals of care   Length of Stay: 4  Current Medications: Scheduled Meds:   Chlorhexidine  Gluconate Cloth  6 each Topical Daily   cyanocobalamin   500 mcg Per Tube Daily   ezetimibe   10 mg Per Tube Daily   feeding supplement (PROSource TF20)  60 mL Per Tube Daily   insulin  aspart  0-20 Units Subcutaneous Q4H   insulin  glargine-yfgn  12 Units Subcutaneous Daily   levETIRAcetam   1,000 mg Intravenous Q12H   multivitamin with minerals  1 tablet Per Tube Daily   mouth rinse  15 mL Mouth Rinse Q2H   pantoprazole  (PROTONIX ) IV  40 mg Intravenous QHS   [START ON 11/01/2023] predniSONE   10 mg Per Tube Q breakfast   thiamine   100 mg Per Tube Daily    Continuous Infusions:  ampicillin -sulbactam (UNASYN ) IV 3 g (10/31/23 1413)   feeding supplement (OSMOLITE 1.5 CAL) 45 mL/hr at 10/31/23 1300   fentaNYL  infusion INTRAVENOUS 50 mcg/hr (10/31/23 1300)   norepinephrine  (LEVOPHED ) Adult infusion Stopped (10/30/23 1216)   potassium PHOSPHATE IVPB (in mmol) 85 mL/hr at 10/31/23 1300   propofol  (DIPRIVAN ) infusion Stopped (10/30/23 1500)   vasopressin  Stopped (10/30/23 1342)    PRN Meds: acetaminophen  (TYLENOL ) oral liquid 160 mg/5 mL, fentaNYL  (SUBLIMAZE ) injection, hydrALAZINE , mouth rinse  Physical Exam Vitals reviewed.  Constitutional:      General: She is not in acute distress.    Interventions: She is intubated.  Cardiovascular:     Rate and Rhythm: Normal rate.  Pulmonary:     Effort: She is intubated.  Skin:     General: Skin is dry.  Neurological:     Mental Status: She is unresponsive.             Vital Signs: BP (!) 144/67   Pulse (!) 110   Temp (!) 101.2 F (38.4 C) (Axillary)   Resp (!) 25   Ht 5\' 4"  (1.626 m)   Wt 61.4 kg   SpO2 100%   BMI 23.23 kg/m  SpO2: SpO2: 100 % O2 Device: O2  Device: Ventilator O2 Flow Rate:         Palliative Assessment/Data: 30% with feeds      Patient Active Problem List   Diagnosis Date Noted   Localization-related (focal) (partial) idiopathic epilepsy and epileptic syndromes with seizures of localized onset, not intractable, with status epilepticus (HCC) 10/31/2023   Protein-calorie malnutrition, severe 10/28/2023   Subdural hematoma (HCC) 10/27/2023   Hypokalemia 10/27/2023   Hypertensive urgency 10/27/2023   Acute CVA (cerebrovascular accident) (HCC) 10/27/2023   Subdural bleeding (HCC) 10/27/2023   TBI (traumatic brain injury) (HCC) 10/27/2023   Osteoporosis 08/29/2023   Uncontrolled diabetes mellitus with hyperglycemia, with long-term current use of insulin  (HCC) 02/25/2022   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   Cellulitis of right lower leg 04/07/2021   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 08/29/2020   Alcohol use 08/29/2020   Family history of colon cancer in father dx 47 and died 41 w/ second cancer 09-02-19   Insomnia 06/11/2019   Asthma 06/11/2019   Venous (peripheral) insufficiency 04/12/2018   Long term (current) use of systemic steroids 11/02/2017   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 female  with past medical history of autoimmune hepatitis with cirrhosis of liver, hypertension, depression, GERD, hyperlipidemia admitted on 10/26/2023 after falling and hitting her head.   In ED CT showed subdural hematoma, subarachnoid hemorrhage, and occipital skull fracture. She is intubated.    Note from neurology 5/16: Most recent CT demonstrates extensive multifocal posttraumatic intracranial hemorrhage,stable since yesterday with EXCEPT FOR: Small 4 mm mixed but predominantly low-density left Subdural Hematoma has increased.  Today's Discussion: Reviewed chart and received update from nursing. Patient has right lower extremity DVT. She is back on fentanyl  drip as she looked uncomfortable when it was previously stopped. Patient looks comfotable in NAD. Husband Chet and niece are at bedside.  Chet is grateful for visit from PMT chaplain Willetta Harpin. We discussed that the patient has had improvements and set backs during this hospitalization. Chet remains hopeful that the patient will continue to improve. Emotional support provided.  Encouraged family to call PMT with questions or concerns. PMT will continue to support.  Recommendations/Plan: Full code Full scope Time for outcomes Continued PMT support   Code Status:    Code Status Orders  (From admission, onward)           Start     Ordered   10/27/23 0330  Full code  Continuous       Question:  By:  Answer:  Consent: discussion documented in EHR   10/27/23 0330         Extensive chart review has been completed prior to seeing the patient including labs, vital signs, imaging, progress/consult notes, orders, medications, and available advance directive documents.    Care plan was discussed with bedside RN  Time spent: 25 minutes  Thank you for allowing the Palliative Medicine Team to assist in the care of this patient.    Daina Drum,  NP  Please contact Palliative Medicine Team phone at [phone] for questions and concerns.

## 2023-10-31 NOTE — Progress Notes (Signed)
 NEUROLOGY PROGRESS NOTE   Date of service: Oct 31, 2023 Patient Name: Miranda Cochran MRN:  [id number] DOB:  [birth year] Chief Complaint: "seizure " Requesting Provider: Mannam, Praveen, MD  Subjective   Overnight EEG with no seizures.   Xanax  use is only 1-2x/week at home per husband No new complaints prior to TBI per husband  ROS  Unable to ascertain due to altered mental status post sedation and intubated    Current Facility-Administered Medications:    acetaminophen  (TYLENOL ) 160 MG/5ML solution 650 mg, 650 mg, Per Tube, Q4H PRN, Ogan, Okoronkwo U, MD, 650 mg at 10/31/23 0007   Ampicillin -Sulbactam (UNASYN ) 3 g in sodium chloride  0.9 % 100 mL IVPB, 3 g, Intravenous, Q6H, Millen, Jessica B, RPH, Stopped at 10/31/23 0323   Chlorhexidine  Gluconate Cloth 2 % PADS 6 each, 6 each, Topical, Daily, Mannam, Praveen, MD, 6 each at 10/31/23 0408   cyanocobalamin  (VITAMIN B12) tablet 500 mcg, 500 mcg, Per Tube, Daily, Mannam, Praveen, MD, 500 mcg at 10/30/23 0906   ezetimibe  (ZETIA ) tablet 10 mg, 10 mg, Per Tube, Daily, Mannam, Praveen, MD, 10 mg at 10/30/23 0906   feeding supplement (OSMOLITE 1.5 CAL) liquid 1,000 mL, 1,000 mL, Per Tube, Continuous, Mannam, Praveen, MD, Last Rate: 45 mL/hr at 10/31/23 0600, Infusion Verify at 10/31/23 0600   feeding supplement (PROSource TF20) liquid 60 mL, 60 mL, Per Tube, Daily, Mannam, Praveen, MD, 60 mL at 10/30/23 0905   fentaNYL  (SUBLIMAZE ) injection 25-100 mcg, 25-100 mcg, Intravenous, Q30 min PRN, Miranda Nigh, MD, 25 mcg at 10/30/23 1446   fentaNYL  in NS (44mcg/ml) infusion-PREMIX, 0-400 mcg/hr, Intravenous, Continuous, Miranda Nigh, MD, Last Rate: 2.5 mL/hr at 10/31/23 0600, 25 mcg/hr at 10/31/23 0600   hydrALAZINE  (APRESOLINE ) injection 10 mg, 10 mg, Intravenous, Q4H PRN, Akula, Vijaya, MD, 10 mg at 10/27/23 1201   hydrocortisone  sodium succinate  (SOLU-CORTEF ) 100 MG injection 100 mg, 100 mg, Intravenous, Daily, Miranda Nigh, MD,  100 mg at 10/30/23 3086   insulin  aspart (novoLOG ) injection 0-20 Units, 0-20 Units, Subcutaneous, Q4H, Miranda Nigh, MD, 4 Units at 10/31/23 0407   insulin  glargine-yfgn (SEMGLEE ) injection 12 Units, 12 Units, Subcutaneous, Daily, Mannam, Praveen, MD, 12 Units at 10/30/23 5784   levETIRAcetam  (KEPPRA ) undiluted injection 1,500 mg, 1,500 mg, Intravenous, Q12H, Arora, Ashish, MD, 1,500 mg at 10/30/23 2118   multivitamin with minerals tablet 1 tablet, 1 tablet, Per Tube, Daily, Mannam, Praveen, MD, 1 tablet at 10/30/23 0906   norepinephrine  (LEVOPHED ) 4mg  in (0.016 mg/mL) premix infusion, 0-40 mcg/min, Intravenous, Titrated, Miranda Nigh, MD, Stopped at 10/30/23 1216   Oral care mouth rinse, 15 mL, Mouth Rinse, Q2H, Miranda Nigh, MD, 15 mL at 10/31/23 0502   Oral care mouth rinse, 15 mL, Mouth Rinse, PRN, Miranda Nigh, MD   pantoprazole  (PROTONIX ) injection 40 mg, 40 mg, Intravenous, QHS, Miranda Nigh, MD, 40 mg at 10/30/23 2118   PHENObarbital  (LUMINAL) injection 65 mg, 65 mg, Intravenous, BID, Arora, Ashish, MD, 65 mg at 10/30/23 2118   propofol  (DIPRIVAN ) 1000 MG/100ML infusion, 0-50 mcg/kg/min, Intravenous, Continuous, Miranda Nigh, MD, Stopped at 10/30/23 1500   thiamine  (VITAMIN B1) tablet 100 mg, 100 mg, Per Tube, Daily, Mannam, Praveen, MD, 100 mg at 10/30/23 0905   vasopressin  (PITRESSIN) 20 Units in 100 mL (0.2 unit/mL) infusion-*FOR SHOCK*, 0-0.03 Units/min, Intravenous, Continuous, Mannam, Praveen, MD, Stopped at 10/30/23 1342  Vitals   Vitals:   10/31/23 0442 10/31/23 0500 10/31/23 0600 10/31/23 0700  BP:  116/65 134/64 (!) 148/75  Pulse:  98 97 (!) 103  Resp:  (!) 24 (!) 24 (!) 24  Temp:      TempSrc:      SpO2:  98% 99% 99%  Weight: 61.4 kg     Height:        Body mass index is 23.23 kg/m.  Physical Exam   Constitutional: Appears ill, Eyes: No scleral injection.  HENT: Currently intubated Head: Large scalp hematoma, with dressing in  place. Cardiovascular: Tachycardic  Neurologic Examination  She was sedated intubated with propofol  off since 3 PM 5/18 and fentanyl  25 mcg held for 20 min prior to exam Extremely obtunded Took a while to open her eyes to noxious stimulation Did not follow commands Pupils were equal round reactive to light No gaze preference or deviation, head turn to the right slightly Does not blink to threat from either side Face appears grossly symmetric To noxious stimulation, has some movement and attempts to localization with the right upper extremity and lower extremity, withdraws in the left upper and lower extremity is withdrawal vs. flexion   Labs/Imaging/Neurodiagnostic studies    Basic Metabolic Panel: Recent Labs  Lab 10/28/23 0443 10/28/23 1600 10/29/23 0337 10/29/23 0408 10/30/23 0100 10/30/23 0425 10/31/23 0449  NA 137 137 135 134*  --  136 143  K 2.8* 4.6 3.2* 3.4*  --  3.6 3.6  CL 105 108 107  --   --  106 109  CO2 22 22 19*  --   --  22 23  GLUCOSE 112* 206* 247*  --   --  134* 156*  BUN 7* 7* 14  --   --  16 10  CREATININE 0.46 0.47 0.64  --   --  0.44 0.44  CALCIUM  7.7* 7.0* 6.5*  --   --  6.9* 7.5*  MG 2.4 2.2 1.9  --   --  2.1 2.2  PHOS 2.2* 4.1 1.6*  --  2.1* 1.9* 1.8*    CBC: Recent Labs  Lab 10/27/23 0504 10/27/23 1236 10/27/23 2204 10/28/23 0443 10/29/23 0337 10/29/23 0408 10/30/23 0425 10/31/23 0449  WBC 14.7*  --   --  11.8* 15.5*  --  20.9* 13.7*  HGB 12.3   < >  --  9.7* 9.2* 9.9* 9.5* 9.9*  HCT 36.9   < >  --  28.6* 29.3* 29.0* 27.6* 30.7*  MCV 96.6  --   --  98.3 102.8*  --  95.8 101.7*  PLT 177  --  157 138* 182  --  190 179   < > = values in this interval not displayed.     Lipid Panel:  Lab Results  Component Value Date   CHOL 195 02/25/2022   HDL 52.70 02/25/2022   LDLCALC 113 (H) 02/25/2022   LDLDIRECT 133.0 09/15/2021   TRIG 111 10/31/2023   CHOLHDL 4 02/25/2022    HgbA1c:  Lab Results  Component Value Date   HGBA1C 6.8  (H) 10/27/2023   Urine Drug Screen:  INR  Lab Results  Component Value Date   INR 1.2 10/27/2023   APTT  Lab Results  Component Value Date   APTT 22 (L) 10/27/2023   Phenobarbital  24.8 post load level on 5/17   CT Head without contrast(Personally reviewed): 5/14 IMPRESSION: 1. Mixed subdural and subarachnoid hemorrhage of the right hemisphere, predominantly anterior. No midline shift or other mass effect. 2. Nondisplaced left occipital skull fracture with large left posterior scalp hematoma.  3. No acute fracture or static subluxation of the cervical spine.  5/15 1. Interval increase in size of an acute subdural hematoma along the right cerebral convexity (greatest along the anterior right frontal lobe), now measuring up to 9 mm in thickness (previously 7 mm). 2. Acute subarachnoid hemorrhage scattered along the right cerebral convexity, increased. 3. Hemorrhagic parenchymal contusions within the anterior right temporal lobe and anteroinferior right frontal lobe have increased in size. 4. Trace acute subarachnoid hemorrhage along the anterolateral left temporal lobe, new from the prior exam. Possible small underlying left temporal lobe hemorrhagic parenchymal contusions. 5. A 2.3 x 2.6 cm hemorrhagic parenchymal contusion within the left cerebellar hemisphere has increased in size (previously 2.1 x 1.8 cm). 6. Thin subdural hemorrhage layering along the tentorium, bilaterally, similar to the prior exam. 7. Trace subarachnoid hemorrhage (versus prominent choroid plexus) within the inferior aspect of the fourth ventricle. 8. Non-depressed fracture of the left occipital calvarium. 9. Left occipital scalp hematoma and laceration.  5/15 Follow-up/Time 1. An acute subdural hematoma along the right cerebral convexity has undergone some interval redistribution but has not significantly changed in overall extent. 2. Continued slight interval increase in extent of  acute subarachnoid hemorrhage scattered along the right cerebral convexity. 3. Progressive edema at sites of hemorrhagic parenchymal contusions within the anteroinferior right frontal lobe and anterior right temporal lobe. 4. Small-volume subarachnoid hemorrhage scattered along the left cerebral convexity has mildly increased. Possible small underlying hemorrhagic parenchymal contusions within the anterolateral left temporal lobe, as before. 5. Thin acute subdural hemorrhage layering along the tentorium bilaterally, unchanged. 6. 2.3 x 2.6 cm acute parenchymal hemorrhage within the left cerebellar hemisphere, unchanged. 7. Trace subarachnoid hemorrhage again questioned at the inferior aspect of the fourth ventricle. 8. No midline shift or hydrocephalus. 9. Known non-depressed fracture of the left occipital calvarium.  5/16 1. Extensive multifocal posttraumatic intracranial hemorrhage, stable since yesterday with EXCEPT FOR: Small 4 mm mixed but predominantly low-density left Subdural Hematoma has increased (coronal image 46). 2. No midline shift or significant intracranial mass effect despite the findings. No ventriculomegaly. Stable basilar cisterns. 3. Nondisplaced left posterior convexity skull fracture with large  5/17 1. Stable extensive multifocal posttraumatic intracranial hemorrhage since yesterday. No midline shift. Basilar cisterns remain patent. 2. No new intracranial abnormality. 3. Stable nondisplaced left posterior skull fracture. Scalp hematomas.   CT C-spine (5/14)  No acute fracture or static subluxation of the cervical spine.   Neurodiagnostics  Continuous EEG  10/27/2023 to 10/28/2023 This study is suggestive of cortical dysfunction arising from right hemisphere likely secondary to underlying structural abnormality. Additionally there is moderate to severe diffuse encephalopathy likely related to sedation. No seizures or epileptiform discharges were seen  throughout the recording 10/28/2023 to 10/29/2023 This study was initially suggestive of cortical dysfunction arising from right hemisphere likely secondary to underlying structural abnormality. Additionally there was moderate to severe diffuse encephalopathy likely related to sedation.  After around 1746 on 10/28/2023, EEG worsened and showed seizures without clinical signs arising from right frontal region. Average 4 seizures were noted per hour, lasting about 1 minute each. 10/29/2023 1319 hrs. to 10/30/2023 0730 hrs Continuous slow, generalized and lateralized right hemisphere.  Study suggestive of cortical dysfunction or arising from right hemisphere likely secondary to underlying structural abnormality.  Additionally moderate to severe diffuse encephalopathy likely related to sedation.  No seizures seen. 10/30/2023 1319 to 10/31/2023 0700  - Continuous slow, generalized; This study was suggestive of moderate diffuse encephalopathy. No seizures were noted.  ASSESSMENT   NEDDA GAINS is a 73 y.o. 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 Patient arrived on 5/14 via EMS for evaluation after a fall and hit her head.    Imaging concerning for occipital fracture with acute subarachnoid hemorrhage, multiple contusions and a right subdural hematoma. Most recent CT demonstrates extensive multifocal posttraumatic intracranial hemorrhage.   Repeat CT after admission with some worsening but that has stabilized on further imaging.  Initially loaded with Keppra  due to electrographic seizures, Keppra  dose increased, continue to have electrographic seizures for which phenobarb was added.  Upon increasing dose of Keppra  and addition of phenobarb on 5/17 - electrographically, the seizures have subsided.  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   RECOMMENDATIONS  -- Low threshold to repeat head CT if acute or worsening neurological function -- Recommend neuro checks every 2 hours   -- Check phenobarbital  level today, discontinue phenobarb today and observe -- continue LTM EEG while adjusting meds -- MRI brain w/ and w/o contrast due to autoimmune history and age once off of EEG  - reduce Keppra  to 1000 twice daily (max for her renal function with CrCl 55)  Discussed with Dr. Waylan Haggard, and imaging and plan / history reviewed with husband at bedside   -- Baldwin Levee MD-PhD [facility] [phone]    CRITICAL CARE ATTESTATION Performed by: Ronnette Coke  Total critical care time: 40 minutes Critical care time was exclusive of separately billable procedures and treating other patients and/or supervising APPs/Residents/Students Critical care was necessary to treat or prevent imminent or life-threatening deterioration. This patient is critically ill and at significant risk for neurological worsening and/or death and care requires constant monitoring. Critical care was time spent personally by me on the following activities: development of treatment plan with patient and/or surrogate as well as nursing, discussions with consultants, evaluation of patient's response to treatment, examination of patient, obtaining history from patient or surrogate, ordering and performing treatments and interventions, ordering and review of laboratory studies, ordering and review of radiographic studies, pulse oximetry, re-evaluation of patient's condition, participation in multidisciplinary rounds and medical decision making of high complexity in the care of this patient.

## 2023-10-31 NOTE — Progress Notes (Signed)
 Pt was transported to IR and back via ventilator with no apparent complications.

## 2023-10-31 NOTE — Plan of Care (Signed)
  Problem: Clinical Measurements: Goal: Neurologic status will improve Outcome: Progressing   Problem: Skin Integrity: Goal: Risk for impaired skin integrity will decrease Outcome: Progressing   Problem: Nutritional: Goal: Risk of aspiration will decrease Outcome: Progressing   Problem: Safety: Goal: Ability to remain free from injury will improve Outcome: Progressing

## 2023-10-31 NOTE — Progress Notes (Signed)
 Pt was transported to CT and back via ventilator with no apparent complications. Pt is now back in 4N.

## 2023-10-31 NOTE — Progress Notes (Signed)
 LTM maint complete - no skin breakdown under: Fp1, Fp2, Fz

## 2023-10-31 NOTE — Procedures (Addendum)
 Patient Name: Miranda Cochran  MRN: [id number]  Epilepsy Attending: Arleene Lack  Referring Physician/Provider: Josiah Nigh, MD  Duration: 10/30/2023 1319 to 10/31/2023 1319   Patient history: 72yo F with right occipital fracture with acute subarachnoid hemorrhage, multiple contusions and a right subdural hematoma. EEG to evaluate for seizure   Level of alertness: comatose/ lethargic   AEDs during EEG study: LEV, phenobarb   Technical aspects: This EEG study was done with scalp electrodes positioned according to the 10-20 International system of electrode placement. Electrical activity was reviewed with band pass filter of 1-70Hz , sensitivity of 7 uV/mm, display speed of 32mm/sec with a 60Hz  notched filter applied as appropriate. EEG data were recorded continuously and digitally stored.  Video monitoring was available and reviewed as appropriate.   Description: EEG showed near continuous generalized 3 to 6 Hz theta-delta slowing admixed with 12-14hz  beta activity.  Hyperventilation and photic stimulation were not performed.       ABNORMALITY - Continuous slow, generalized    IMPRESSION: This study was suggestive of moderate diffuse encephalopathy. No seizures were noted.    Bambi Fehnel O Britnie Colville

## 2023-10-31 NOTE — Procedures (Signed)
 Interventional Radiology Procedure Note  Procedure: IVC filter placement  Complications: None  Estimated Blood Loss: < 5 mL  Findings: IVC venogram shows normally patent IVC. Bard Demetreus [place] IVC filter deployed in infrarenal IVC.   Nonda Bays. Nereida Banning, M.D Pager:  [phone]

## 2023-11-01 ENCOUNTER — Inpatient Hospital Stay (HOSPITAL_COMMUNITY)

## 2023-11-01 DIAGNOSIS — I82451 Acute embolism and thrombosis of right peroneal vein: Secondary | ICD-10-CM

## 2023-11-01 DIAGNOSIS — S066XAA Traumatic subarachnoid hemorrhage with loss of consciousness status unknown, initial encounter: Secondary | ICD-10-CM | POA: Diagnosis not present

## 2023-11-01 DIAGNOSIS — G9340 Encephalopathy, unspecified: Secondary | ICD-10-CM | POA: Diagnosis not present

## 2023-11-01 DIAGNOSIS — Z7189 Other specified counseling: Secondary | ICD-10-CM | POA: Diagnosis not present

## 2023-11-01 DIAGNOSIS — R569 Unspecified convulsions: Secondary | ICD-10-CM | POA: Diagnosis not present

## 2023-11-01 DIAGNOSIS — S02119A Unspecified fracture of occiput, initial encounter for closed fracture: Secondary | ICD-10-CM | POA: Diagnosis not present

## 2023-11-01 DIAGNOSIS — E876 Hypokalemia: Secondary | ICD-10-CM | POA: Diagnosis not present

## 2023-11-01 DIAGNOSIS — Z515 Encounter for palliative care: Secondary | ICD-10-CM | POA: Diagnosis not present

## 2023-11-01 DIAGNOSIS — S065XAA Traumatic subdural hemorrhage with loss of consciousness status unknown, initial encounter: Secondary | ICD-10-CM | POA: Diagnosis not present

## 2023-11-01 DIAGNOSIS — G40901 Epilepsy, unspecified, not intractable, with status epilepticus: Secondary | ICD-10-CM | POA: Diagnosis not present

## 2023-11-01 LAB — COMPREHENSIVE METABOLIC PANEL WITH GFR
ALT: 20 U/L (ref 0–44)
AST: 30 U/L (ref 15–41)
Albumin: 2.1 g/dL — ABNORMAL LOW (ref 3.5–5.0)
Alkaline Phosphatase: 104 U/L (ref 38–126)
Anion gap: 10 (ref 5–15)
BUN: 11 mg/dL (ref 8–23)
CO2: 23 mmol/L (ref 22–32)
Calcium: 7.5 mg/dL — ABNORMAL LOW (ref 8.9–10.3)
Chloride: 108 mmol/L (ref 98–111)
Creatinine, Ser: 0.39 mg/dL — ABNORMAL LOW (ref 0.44–1.00)
GFR, Estimated: >60 mL/min (ref >60)
Glucose, Bld: 269 mg/dL — ABNORMAL HIGH (ref 70–99)
Potassium: 3.3 mmol/L — ABNORMAL LOW (ref 3.5–5.1)
Sodium: 141 mmol/L (ref 135–145)
Total Bilirubin: 0.6 mg/dL (ref 0.0–1.2)
Total Protein: 5.9 g/dL — ABNORMAL LOW (ref 6.5–8.1)

## 2023-11-01 LAB — CBC
HCT: 29.8 % — ABNORMAL LOW (ref 36.0–46.0)
Hemoglobin: 9.6 g/dL — ABNORMAL LOW (ref 12.0–15.0)
MCH: 32.8 pg (ref 26.0–34.0)
MCHC: 32.2 g/dL (ref 30.0–36.0)
MCV: 101.7 fL — ABNORMAL HIGH (ref 80.0–100.0)
Platelets: 161 10*3/uL (ref 150–400)
RBC: 2.93 MIL/uL — ABNORMAL LOW (ref 3.87–5.11)
RDW: 14.4 % (ref 11.5–15.5)
WBC: 11.1 10*3/uL — ABNORMAL HIGH (ref 4.0–10.5)
nRBC: 0 % (ref 0.0–0.2)

## 2023-11-01 LAB — MAGNESIUM: Magnesium: 2 mg/dL (ref 1.7–2.4)

## 2023-11-01 LAB — GLUCOSE, CAPILLARY
Glucose-Capillary: 178 mg/dL — ABNORMAL HIGH (ref 70–99)
Glucose-Capillary: 201 mg/dL — ABNORMAL HIGH (ref 70–99)
Glucose-Capillary: 230 mg/dL — ABNORMAL HIGH (ref 70–99)
Glucose-Capillary: 239 mg/dL — ABNORMAL HIGH (ref 70–99)
Glucose-Capillary: 253 mg/dL — ABNORMAL HIGH (ref 70–99)
Glucose-Capillary: 274 mg/dL — ABNORMAL HIGH (ref 70–99)

## 2023-11-01 LAB — PHOSPHORUS: Phosphorus: 1.6 mg/dL — ABNORMAL LOW (ref 2.5–4.6)

## 2023-11-01 MED ORDER — SODIUM CHLORIDE 0.9 % IV SOLN
30.0000 mmol | Freq: Once | INTRAVENOUS | Status: AC
  Administered 2023-11-01: 30 mmol via INTRAVENOUS
  Filled 2023-11-01: qty 10

## 2023-11-01 MED ORDER — ACETAMINOPHEN 160 MG/5ML PO SOLN
ORAL | Status: AC
Start: 2023-11-01 — End: 2023-11-01
  Filled 2023-11-01: qty 20.3

## 2023-11-01 MED ORDER — INSULIN GLARGINE-YFGN 100 UNIT/ML ~~LOC~~ SOLN
18.0000 [IU] | Freq: Every day | SUBCUTANEOUS | Status: DC
  Filled 2023-11-01: qty 0.18

## 2023-11-01 MED ORDER — ACETAMINOPHEN 160 MG/5ML PO SOLN
650.0000 mg | ORAL | Status: AC | PRN
  Administered 2023-11-01 – 2023-11-04 (×12): 650 mg
  Filled 2023-11-01 (×11): qty 20.3

## 2023-11-01 MED ORDER — INSULIN GLARGINE-YFGN 100 UNIT/ML ~~LOC~~ SOLN
5.0000 [IU] | Freq: Once | SUBCUTANEOUS | Status: AC
  Administered 2023-11-01: 5 [IU] via SUBCUTANEOUS
  Filled 2023-11-01: qty 0.05

## 2023-11-01 MED ORDER — POTASSIUM & SODIUM PHOSPHATES 280-160-250 MG PO PACK
1.0000 | PACK | Freq: Three times a day (TID) | ORAL | Status: AC
  Administered 2023-11-01 (×3): 1
  Filled 2023-11-01 (×3): qty 1

## 2023-11-01 MED ORDER — DEXMEDETOMIDINE HCL IN NACL 400 MCG/100ML IV SOLN
0.0000 ug/kg/h | INTRAVENOUS | Status: DC
  Administered 2023-11-01 – 2023-11-02 (×2): 0.4 ug/kg/h via INTRAVENOUS
  Administered 2023-11-02 (×2): 0.5 ug/kg/h via INTRAVENOUS
  Administered 2023-11-03: 0.6 ug/kg/h via INTRAVENOUS
  Administered 2023-11-03: 0.4 ug/kg/h via INTRAVENOUS
  Filled 2023-11-01 (×6): qty 100

## 2023-11-01 MED ORDER — CEFTRIAXONE SODIUM 2 G IJ SOLR
2.0000 g | INTRAMUSCULAR | Status: AC
  Administered 2023-11-01 – 2023-11-04 (×4): 2 g via INTRAVENOUS
  Filled 2023-11-01 (×4): qty 20

## 2023-11-01 NOTE — Progress Notes (Addendum)
 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  Has history of recurrent falls at home for unclear reason per husband at bedside.  Was being managed expectantly with NSGY following when had abrupt reduced LOC and tachyarrythmia.  On my arrival in SVT with pursed lip breathing.  Stat repeat head CT does show enlargement of multiple brain contusions.  NSGY aware and will see patient later today.  PCCM consulted to manage.  Trauma scan neg.  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   Significant Hospital Events: Including procedures, antibiotic start and stop dates in addition to other pertinent events   5/14 admit 5/15 AMS 5/16 Remains on minimal vent settings, hypoglycemic episode this AM, originally following commands overnight. Repeat CT Scan shows mild worsening of bleeding 5/17 Seizures on EEG.  Given additional dose of Keppra , Keppra  dose increased and loaded with phenobarb. CT head with stable extensive  multifocal posttraumatic ICH, no MLS, basilar cisterns remain patent, stable nondisplaced left posterior skull fx 5/18 EEG neg. Sedation off, tolerating SBT 8/5 but not waking up yet 5/19 LE duplex with R DVT, had IVC filter placed as AC not cleared by neurosurgery. CTA chest neg for PE but showed LLL PNA  Interim History / Subjective:  Required Fentanyl  for vent comfort/synchrony. Off this AM. She opens eyes but does not follow commands.   Objective    Blood pressure (!) 150/67, pulse (!) 116, temperature 98.7 F (37.1 C), temperature source Oral, resp. rate (!) 28, height 5\' 4"  (1.626 m), weight 59.9 kg, SpO2 97%.    Vent Mode: PSV;CPAP FiO2 (%):  [40 %] 40 % Set Rate:  [24 bmp] 24 bmp Vt Set:  [430 mL] 430 mL PEEP:  [5 cmH20] 5 cmH20 Pressure Support:  [5 cmH20] 5 cmH20 Plateau Pressure:  [15 cmH20-19 cmH20] 15 cmH20   Intake/Output Summary (Last 24 hours) at 11/01/2023 0916 Last data filed at 11/01/2023 0900 Gross per 24 hour  Intake 1831.03 ml  Output 2300 ml  Net -468.97 ml   Filed Weights   10/30/23 0418 10/31/23 0442 11/01/23 0500  Weight: 62.6 kg 61.4 kg 59.9 kg   Examination: General: Adult female, resting in bed, in NAD. Neuro: Opens eyes to noxious stimuli but does not follow commands (was on 75 Fentanyl  which was held this AM). HEENT: [place]/AT. Sclerae anicteric. ETT in place. Cardiovascular: RRR, no M/R/G.  Lungs: Respirations even and unlabored.  CTA bilaterally, No W/R/R. Abdomen: BS x 4, soft, NT/ND.  Musculoskeletal: No gross deformities, no edema.    Resolved problem list  Hypoglycemia  Assessment and Plan  Acute encephalopathy, inability to protect airway- in context of worsening TBI, r/o seizure, no herniation  on CT. TBI- good deal of traumatic subdural hematoma, SAH, R temporal contusion, L cer Continue LTM per neuro,? Stop date Repeat CT head if any acute or worsening neuro function F/u on brain MRI once off EEG Continue Keppra  per neuro Q2hr  neuro checks  Acute hypoxic respiratory failure due to hospital-acquired pneumonia - sputum culture 5/17 with E.coli and Strep pneumoniae Continue Unasyn  x 7 days Continue vent support Daily SBT as able Bronchial hygiene CXR intermittently  Autoimmune hepatitis/cirrhosis on chronic prednisone  Continue PTA Prednisone   Hyperglycemia SSI  RLE DVT - s/p IVC filter placement 5/19. Not cleared for anticoagulation by neurosurgery. CTA neg for PE. - Continue IVC filter. - Holding AC until ok with NSGY.  Hypokalemia - being repleted Hypophosphatemia - being repleted Continue repletion as already ordered Continue to monitor BMP  Best Practice (right click and "Reselect all SmartList Selections" daily)   Diet/type: tubefeeds DVT prophylaxis SCD.  Pressure ulcer(s): N/A GI prophylaxis: PPI Lines: Central line Foley:  Yes, and it is still needed Code Status:  full code Last date of multidisciplinary goals of care discussion [updated husband] 5/16   Critical care time: 30 min.    Rafael Bun, PA - [facility] For pager details, please see AMION or use Epic chat  After 1900, please call [facility] for cross coverage needs 11/01/2023, 9:16 AM

## 2023-11-01 NOTE — Progress Notes (Signed)
 PCCM PM Rounds  Agitated on PSV with increase RR and HR. Fentanyl  back on. Back to full support on vent. Precedex added to Fentanyl .  Palliative met with spouse earlier. Continuing current full scope as expected.   Rafael Bun, PA - [facility] For pager details, please see AMION or use Epic chat  After 1900, please call [facility] for cross coverage needs 11/01/2023, 4:00 PM

## 2023-11-01 NOTE — Procedures (Addendum)
 Patient Name: Miranda Cochran  MRN: [id number]  Epilepsy Attending: Arleene Lack  Referring Physician/Provider: Josiah Nigh, MD  Duration: 10/31/2023 1319 to 11/01/2023 1319   Patient history: 72yo F with right occipital fracture with acute subarachnoid hemorrhage, multiple contusions and a right subdural hematoma. EEG to evaluate for seizure   Level of alertness: lethargic   AEDs during EEG study: LEV  Technical aspects: This EEG study was done with scalp electrodes positioned according to the 10-20 International system of electrode placement. Electrical activity was reviewed with band pass filter of 1-70Hz , sensitivity of 7 uV/mm, display speed of 23mm/sec with a 60Hz  notched filter applied as appropriate. EEG data were recorded continuously and digitally stored.  Video monitoring was available and reviewed as appropriate.   Description: EEG showed near continuous generalized 3 to 6 Hz theta-delta slowing admixed with 12-14hz  beta activity.  Hyperventilation and photic stimulation were not performed.       ABNORMALITY - Continuous slow, generalized    IMPRESSION: This study was suggestive of moderate diffuse encephalopathy. No seizures were noted.    Arnitra Sokoloski O Olena Willy

## 2023-11-01 NOTE — Progress Notes (Signed)
 LTM maint complete - no skin breakdown under: F7 A2 Fp1 Serviced several leads [facility] monitored, Event button test confirmed by [facility].

## 2023-11-01 NOTE — Progress Notes (Signed)
 [name], female   DOB: 11/13/1950, 73 y.o.   MRN: [id number] Semi-opening brisk responses but the main following has been inconsistently reported.  Continue supportive care.

## 2023-11-01 NOTE — Progress Notes (Signed)
 NEUROLOGY PROGRESS NOTE   Date of service: Nov 01, 2023 Patient Name: Miranda Cochran MRN:  [id number] DOB:  Nov 26, 1950 Chief Complaint: "seizure " Requesting Provider: Mannam, Praveen, MD  Subjective   IVC filter placed in setting of RLE DVT  CTA PE negative for PE   ROS  Unable to ascertain due to altered mental status post sedation and intubated    Current Facility-Administered Medications:    acetaminophen  (TYLENOL ) 160 MG/5ML solution 650 mg, 650 mg, Per Tube, Q4H PRN, Paliwal, Aditya, MD, 650 mg at 11/01/23 0602   Ampicillin -Sulbactam (UNASYN ) 3 g in sodium chloride  0.9 % 100 mL IVPB, 3 g, Intravenous, Q6H, Millen, Jessica B, RPH, Stopped at 11/01/23 3875   Chlorhexidine  Gluconate Cloth 2 % PADS 6 each, 6 each, Topical, Daily, Mannam, Praveen, MD, 6 each at 11/01/23 0245   cyanocobalamin  (VITAMIN B12) tablet 500 mcg, 500 mcg, Per Tube, Daily, Mannam, Praveen, MD, 500 mcg at 10/31/23 1010   ezetimibe  (ZETIA ) tablet 10 mg, 10 mg, Per Tube, Daily, Mannam, Praveen, MD, 10 mg at 10/31/23 1011   feeding supplement (OSMOLITE 1.5 CAL) liquid 1,000 mL, 1,000 mL, Per Tube, Continuous, Mannam, Praveen, MD, Last Rate: 45 mL/hr at 11/01/23 0700, Infusion Verify at 11/01/23 0700   feeding supplement (PROSource TF20) liquid 60 mL, 60 mL, Per Tube, Daily, Mannam, Praveen, MD, 60 mL at 10/31/23 1010   fentaNYL  (SUBLIMAZE ) bolus via infusion 30 mcg, 30 mcg, Intravenous, Q30 min PRN, Paliwal, Aditya, MD   fentaNYL  in NS 250mL (60mcg/ml) infusion-PREMIX, 0-400 mcg/hr, Intravenous, Continuous, Josiah Nigh, MD, Last Rate: 7.5 mL/hr at 11/01/23 0700, 75 mcg/hr at 11/01/23 0700   hydrALAZINE  (APRESOLINE ) injection 10 mg, 10 mg, Intravenous, Q4H PRN, Akula, Vijaya, MD, 10 mg at 11/01/23 0607   insulin  aspart (novoLOG ) injection 0-20 Units, 0-20 Units, Subcutaneous, Q4H, Josiah Nigh, MD, 4 Units at 11/01/23 0415   insulin  glargine-yfgn (SEMGLEE ) injection 12 Units, 12 Units, Subcutaneous,  Daily, Mannam, Praveen, MD, 12 Units at 10/31/23 1010   levETIRAcetam  (KEPPRA ) undiluted injection 1,000 mg, 1,000 mg, Intravenous, Q12H, Leonel Mccollum L, MD, 1,000 mg at 10/31/23 2119   multivitamin with minerals tablet 1 tablet, 1 tablet, Per Tube, Daily, Mannam, Praveen, MD, 1 tablet at 10/31/23 1011   norepinephrine  (LEVOPHED ) 4mg  in (0.016 mg/mL) premix infusion, 0-40 mcg/min, Intravenous, Titrated, Josiah Nigh, MD, Stopped at 10/30/23 1216   Oral care mouth rinse, 15 mL, Mouth Rinse, Q2H, Josiah Nigh, MD, 15 mL at 11/01/23 0547   Oral care mouth rinse, 15 mL, Mouth Rinse, PRN, Josiah Nigh, MD   pantoprazole  (PROTONIX ) injection 40 mg, 40 mg, Intravenous, QHS, Josiah Nigh, MD, 40 mg at 10/31/23 2119   potassium & sodium phosphates  (PHOS-NAK) 280-160-250 MG packet 1 packet, 1 packet, Per Tube, TID, Oralee Billow I, RPH   potassium PHOSPHATE 30 mmol in sodium chloride  0.9 % 500 mL infusion, 30 mmol, Intravenous, Once, Oralee Billow I, RPH   predniSONE  (DELTASONE ) tablet 10 mg, 10 mg, Per Tube, Q breakfast, Mannam, Praveen, MD   propofol  (DIPRIVAN ) 1000 MG/100ML infusion, 0-50 mcg/kg/min, Intravenous, Continuous, Josiah Nigh, MD, Stopped at 10/30/23 1500   thiamine  (VITAMIN B1) tablet 100 mg, 100 mg, Per Tube, Daily, Mannam, Praveen, MD, 100 mg at 10/31/23 1011   vasopressin  (PITRESSIN) 20 Units in 100 mL (0.2 unit/mL) infusion-*FOR SHOCK*, 0-0.03 Units/min, Intravenous, Continuous, Mannam, Praveen, MD, Stopped at 10/30/23 1342  Vitals   Vitals:   11/01/23 0500 11/01/23 0600 11/01/23  0700 11/01/23 0800  BP: (!) 165/78 (!) 160/67 (!) 126/53 127/65  Pulse: (!) 118 (!) 112 (!) 122 (!) 121  Resp: (!) 26 (!) 21 (!) 23 (!) 31  Temp:      TempSrc:      SpO2: 97% 97% 96% 97%  Weight: 59.9 kg     Height:        Body mass index is 22.67 kg/m.  Physical Exam   Constitutional: Appears ill Eyes: No scleral injection.  HENT: Currently intubated Head: Large  scalp hematoma, with dressing in place. EEG leeds in place.  Cardiovascular: Tachycardic  Neurologic Examination  She was sedated intubated with fentanyl  75 mcg held for 20 min prior to exam Minimally interactive, slightly brisker than yesterday -- able to open eyes to voice and physical stimulation, inconsistently follows some commands, with R hand squeeze at times Pupils were equal round reactive to light No gaze preference or deviation, head turn to the right slightly Does not blink to threat from either side Face appears grossly symmetric To noxious stimulation, has some movement and attempts to localization with the right upper extremity and lower extremity, withdraws in the left upper and lower extremity is withdrawal vs. flexion   Labs/Imaging/Neurodiagnostic studies    Basic Metabolic Panel: Recent Labs  Lab 10/28/23 1600 10/29/23 0337 10/29/23 0408 10/30/23 0100 10/30/23 0425 10/31/23 0449 11/01/23 0545  NA 137 135 134*  --  136 143 141  K 4.6 3.2* 3.4*  --  3.6 3.6 3.3*  CL 108 107  --   --  106 109 108  CO2 22 19*  --   --  22 23 23   GLUCOSE 206* 247*  --   --  134* 156* 269*  BUN 7* 14  --   --  16 10 11   CREATININE 0.47 0.64  --   --  0.44 0.44 0.39*  CALCIUM  7.0* 6.5*  --   --  6.9* 7.5* 7.5*  MG 2.2 1.9  --   --  2.1 2.2 2.0  PHOS 4.1 1.6*  --  2.1* 1.9* 1.8* 1.6*    CBC: Recent Labs  Lab 10/28/23 0443 10/29/23 0337 10/29/23 0408 10/30/23 0425 10/31/23 0449 11/01/23 0545  WBC 11.8* 15.5*  --  20.9* 13.7* 11.1*  HGB 9.7* 9.2* 9.9* 9.5* 9.9* 9.6*  HCT 28.6* 29.3* 29.0* 27.6* 30.7* 29.8*  MCV 98.3 102.8*  --  95.8 101.7* 101.7*  PLT 138* 182  --  190 179 161     Lipid Panel:  Lab Results  Component Value Date   CHOL 195 02/25/2022   HDL 52.70 02/25/2022   LDLCALC 113 (H) 02/25/2022   LDLDIRECT 133.0 09/15/2021   TRIG 111 10/31/2023   CHOLHDL 4 02/25/2022    HgbA1c:  Lab Results  Component Value Date   HGBA1C 6.8 (H) 10/27/2023   Urine  Drug Screen:  INR  Lab Results  Component Value Date   INR 1.2 10/27/2023   APTT  Lab Results  Component Value Date   APTT 22 (L) 10/27/2023   Phenobarbital  24.8 post load level on 5/17, 25.9 on 5/19  CT Head without contrast(Personally reviewed): 5/14 IMPRESSION: 1. Mixed subdural and subarachnoid hemorrhage of the right hemisphere, predominantly anterior. No midline shift or other mass effect. 2. Nondisplaced left occipital skull fracture with large left posterior scalp hematoma. 3. No acute fracture or static subluxation of the cervical spine.  5/15 1. Interval increase in size of an acute subdural hematoma along the  right cerebral convexity (greatest along the anterior right frontal lobe), now measuring up to 9 mm in thickness (previously 7 mm). 2. Acute subarachnoid hemorrhage scattered along the right cerebral convexity, increased. 3. Hemorrhagic parenchymal contusions within the anterior right temporal lobe and anteroinferior right frontal lobe have increased in size. 4. Trace acute subarachnoid hemorrhage along the anterolateral left temporal lobe, new from the prior exam. Possible small underlying left temporal lobe hemorrhagic parenchymal contusions. 5. A 2.3 x 2.6 cm hemorrhagic parenchymal contusion within the left cerebellar hemisphere has increased in size (previously 2.1 x 1.8 cm). 6. Thin subdural hemorrhage layering along the tentorium, bilaterally, similar to the prior exam. 7. Trace subarachnoid hemorrhage (versus prominent choroid plexus) within the inferior aspect of the fourth ventricle. 8. Non-depressed fracture of the left occipital calvarium. 9. Left occipital scalp hematoma and laceration.  5/15 Follow-up/Time 1. An acute subdural hematoma along the right cerebral convexity has undergone some interval redistribution but has not significantly changed in overall extent. 2. Continued slight interval increase in extent of acute subarachnoid  hemorrhage scattered along the right cerebral convexity. 3. Progressive edema at sites of hemorrhagic parenchymal contusions within the anteroinferior right frontal lobe and anterior right temporal lobe. 4. Small-volume subarachnoid hemorrhage scattered along the left cerebral convexity has mildly increased. Possible small underlying hemorrhagic parenchymal contusions within the anterolateral left temporal lobe, as before. 5. Thin acute subdural hemorrhage layering along the tentorium bilaterally, unchanged. 6. 2.3 x 2.6 cm acute parenchymal hemorrhage within the left cerebellar hemisphere, unchanged. 7. Trace subarachnoid hemorrhage again questioned at the inferior aspect of the fourth ventricle. 8. No midline shift or hydrocephalus. 9. Known non-depressed fracture of the left occipital calvarium.  5/16 1. Extensive multifocal posttraumatic intracranial hemorrhage, stable since yesterday with EXCEPT FOR: Small 4 mm mixed but predominantly low-density left Subdural Hematoma has increased (coronal image 46). 2. No midline shift or significant intracranial mass effect despite the findings. No ventriculomegaly. Stable basilar cisterns. 3. Nondisplaced left posterior convexity skull fracture with large  5/17 1. Stable extensive multifocal posttraumatic intracranial hemorrhage since yesterday. No midline shift. Basilar cisterns remain patent. 2. No new intracranial abnormality. 3. Stable nondisplaced left posterior skull fracture. Scalp hematomas.   CT C-spine 5/14 No acute fracture or static subluxation of the cervical spine.  VAS US  lower extremity 5/19 - Findings consistent with acute deep vein thrombosis involving the right peroneal veins.     CT A PE: 5/19 Negative for pulmonary artery embolus    Neurodiagnostics  Continuous EEG  10/27/2023 to 10/28/2023 This study is suggestive of cortical dysfunction arising from right hemisphere likely secondary to underlying  structural abnormality. Additionally there is moderate to severe diffuse encephalopathy likely related to sedation. No seizures or epileptiform discharges were seen throughout the recording 10/28/2023 to 10/29/2023 This study was initially suggestive of cortical dysfunction arising from right hemisphere likely secondary to underlying structural abnormality. Additionally there was moderate to severe diffuse encephalopathy likely related to sedation.  After around 1746 on 10/28/2023, EEG worsened and showed seizures without clinical signs arising from right frontal region. Average 4 seizures were noted per hour, lasting about 1 minute each. 10/29/2023 1319 hrs. to 10/30/2023 0730 hrs Continuous slow, generalized and lateralized right hemisphere.  Study suggestive of cortical dysfunction or arising from right hemisphere likely secondary to underlying structural abnormality.  Additionally moderate to severe diffuse encephalopathy likely related to sedation.  No seizures seen. 10/30/2023 1319 to 10/31/2023 0700  - Continuous slow, generalized; This study was suggestive of moderate  diffuse encephalopathy. No seizures were noted.  10/31/2023 1319 to 11/01/2023 0900  This study was suggestive of moderate diffuse encephalopathy. No seizures were noted.   ASSESSMENT   Miranda Cochran is a 73 y.o. 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 Patient arrived on 5/14 via EMS for evaluation after a fall and hit her head.    Imaging concerning for occipital fracture with acute subarachnoid hemorrhage, multiple contusions and a right subdural hematoma. Most recent CT demonstrates extensive multifocal posttraumatic intracranial hemorrhage.   Repeat CT after admission with some worsening but that has stabilized on further imaging.  Initially loaded with Keppra  due to electrographic seizures, Keppra  dose increased, continue to have  electrographic seizures for which phenobarb was added.  Upon increasing dose of Keppra  and addition of phenobarb on 5/17 - electrographically, the seizures have subsided. Phenobarbital  was discontinued on 5/19 to help minimize sedation effects. Now remains on only on renal appropriate dose of Keppra . 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 Now found to have right lower extremity DVT. Due to ICH and inability to anticoagulate IVC filter was placed via IR on 5/19. Neurosurgery does not believe it is safe for her to undergo full anticoagulation per note.     RECOMMENDATIONS  -- Low threshold to repeat head CT if acute or worsening neurological function -- continue LTM EEG for an additional day after medication adjustments -- MRI brain w/ and w/o contrast due to autoimmune history and age once off of EEG  - Continue Keppra  1000 twice daily (max for her renal function with CrCl 55)  Discussed with Dr. Waylan Haggard via secure chat plan reviewed with husband at bedside   Miranda Cochran [place] [facility] PGY-1   --  Attending Neurologist's note:  I personally saw this patient, gathering history, performing a full neurologic examination, reviewing relevant labs, personally reviewing relevant imaging including, and formulated the assessment and plan, adding the note above for completeness and clarity to accurately reflect my thoughts  Miranda Levee MD-PhD [facility] [phone]    CRITICAL CARE ATTESTATION Performed by: Ronnette Coke  Total critical care time: 30 minutes Critical care time was exclusive of separately billable procedures and treating other patients and/or supervising APPs/Residents/Students Critical care  was necessary to treat or prevent imminent or life-threatening deterioration. This patient is critically ill and at significant risk for neurological worsening and/or death and care requires constant monitoring. Critical care was time spent personally by me on the following activities: development of treatment plan with patient and/or surrogate as well as nursing, discussions with consultants, evaluation of patient's response to treatment, examination of patient, obtaining history from patient or surrogate, ordering and performing treatments and interventions, ordering and review of laboratory studies, ordering and review of radiographic studies, pulse oximetry, re-evaluation of patient's condition, participation in multidisciplinary rounds and medical decision making of high complexity in the care of this patient.

## 2023-11-01 NOTE — Progress Notes (Signed)
 Palliative:  HPI: 73 y.o. female  with past medical history of autoimmune hepatitis with cirrhosis of liver, hypertension, depression, GERD, hyperlipidemia admitted on 10/26/2023 after falling and hitting her head. In ED CT showed subdural hematoma, subarachnoid hemorrhage, and occipital skull fracture. She is intubated. Note from neurology 5/16: Most recent CT demonstrates extensive multifocal posttraumatic intracranial hemorrhage,stable since yesterday with EXCEPT FOR: Small 4 mm mixed but predominantly low-density left Subdural Hematoma has increased.  I met today after receiving update from my colleague yesterday. I reviewed records. I discussed with RN. I met today with husband, Chet, at [facility] bedside. Chet shares about Jalayla and how much she loves life. They met at work although Camdyn has been out of work recently due to lumbar compression fractures according to [employer]. Chet is hopeful that Aleeza will have some improvement and be able to extubate successfully. We reviewed that she is weaning well so her lungs seem to be working well and the next step is seeing if she can awaken to follow commands so we have better indication she will be able to protect and manage her airway on her own. Chet understands what we are looking for. He reports that she did open her eyes and seemed to blink when asked this morning. We discussed patience. Chet also expresses that he knows that it is yet to be seen what her baseline will be and her quality of life will be. He knows she will be frustrated with limitations. Time to see. Chet also shares about when his first wife died and this brings back a lot of difficult memories for him. Our chaplain is following to support Chet. He has good support from friends and family as well.   All questions/concerns addressed. Emotional support provided.   Exam: Resting comfortably. HR tachy. Weaning well on vent via ETT. No distress. Not following commands. Moves RLE.   Plan: - Full  code, full scope - Time for outcomes - Ongoing support  35 min  Vila Grayer, NP Palliative Medicine Team Pager [phone] (Please see [email or web address] for schedule) Team Phone [phone]

## 2023-11-02 ENCOUNTER — Inpatient Hospital Stay (HOSPITAL_COMMUNITY)

## 2023-11-02 DIAGNOSIS — E876 Hypokalemia: Secondary | ICD-10-CM | POA: Diagnosis not present

## 2023-11-02 DIAGNOSIS — R569 Unspecified convulsions: Secondary | ICD-10-CM | POA: Diagnosis not present

## 2023-11-02 DIAGNOSIS — S02119A Unspecified fracture of occiput, initial encounter for closed fracture: Secondary | ICD-10-CM | POA: Diagnosis not present

## 2023-11-02 DIAGNOSIS — S065XAA Traumatic subdural hemorrhage with loss of consciousness status unknown, initial encounter: Secondary | ICD-10-CM | POA: Diagnosis not present

## 2023-11-02 DIAGNOSIS — G9340 Encephalopathy, unspecified: Secondary | ICD-10-CM | POA: Diagnosis not present

## 2023-11-02 DIAGNOSIS — S066XAA Traumatic subarachnoid hemorrhage with loss of consciousness status unknown, initial encounter: Secondary | ICD-10-CM | POA: Diagnosis not present

## 2023-11-02 DIAGNOSIS — G40901 Epilepsy, unspecified, not intractable, with status epilepticus: Secondary | ICD-10-CM | POA: Diagnosis not present

## 2023-11-02 LAB — PHOSPHORUS: Phosphorus: 4.7 mg/dL — ABNORMAL HIGH (ref 2.5–4.6)

## 2023-11-02 LAB — GLUCOSE, CAPILLARY
Glucose-Capillary: 161 mg/dL — ABNORMAL HIGH (ref 70–99)
Glucose-Capillary: 156 mg/dL — ABNORMAL HIGH (ref 70–99)
Glucose-Capillary: 244 mg/dL — ABNORMAL HIGH (ref 70–99)
Glucose-Capillary: 263 mg/dL — ABNORMAL HIGH (ref 70–99)
Glucose-Capillary: 157 mg/dL — ABNORMAL HIGH (ref 70–99)
Glucose-Capillary: 144 mg/dL — ABNORMAL HIGH (ref 70–99)

## 2023-11-02 LAB — BASIC METABOLIC PANEL WITH GFR
Anion gap: 5 (ref 5–15)
BUN: 18 mg/dL (ref 8–23)
CO2: 25 mmol/L (ref 22–32)
Calcium: 8.2 mg/dL — ABNORMAL LOW (ref 8.9–10.3)
Chloride: 112 mmol/L — ABNORMAL HIGH (ref 98–111)
Creatinine, Ser: 0.41 mg/dL — ABNORMAL LOW (ref 0.44–1.00)
GFR, Estimated: >60 mL/min (ref >60)
Glucose, Bld: 143 mg/dL — ABNORMAL HIGH (ref 70–99)
Potassium: 3.7 mmol/L (ref 3.5–5.1)
Sodium: 142 mmol/L (ref 135–145)

## 2023-11-02 LAB — MAGNESIUM: Magnesium: 2.1 mg/dL (ref 1.7–2.4)

## 2023-11-02 LAB — CBC
HCT: 29.3 % — ABNORMAL LOW (ref 36.0–46.0)
Hemoglobin: 9.1 g/dL — ABNORMAL LOW (ref 12.0–15.0)
MCH: 31.7 pg (ref 26.0–34.0)
MCHC: 31.1 g/dL (ref 30.0–36.0)
MCV: 102.1 fL — ABNORMAL HIGH (ref 80.0–100.0)
Platelets: 172 10*3/uL (ref 150–400)
RBC: 2.87 MIL/uL — ABNORMAL LOW (ref 3.87–5.11)
RDW: 14.6 % (ref 11.5–15.5)
WBC: 9.9 10*3/uL (ref 4.0–10.5)
nRBC: 0 % (ref 0.0–0.2)

## 2023-11-02 MED ORDER — INSULIN GLARGINE-YFGN 100 UNIT/ML ~~LOC~~ SOLN
22.0000 [IU] | Freq: Every day | SUBCUTANEOUS | Status: DC
  Administered 2023-11-02 – 2023-11-04 (×3): 22 [IU] via SUBCUTANEOUS
  Filled 2023-11-02 (×3): qty 0.22

## 2023-11-02 MED ORDER — INSULIN ASPART 100 UNIT/ML IJ SOLN
4.0000 [IU] | INTRAMUSCULAR | Status: DC
  Administered 2023-11-02 – 2023-11-04 (×12): 4 [IU] via SUBCUTANEOUS

## 2023-11-02 MED ORDER — FENTANYL CITRATE PF 50 MCG/ML IJ SOSY
50.0000 ug | PREFILLED_SYRINGE | INTRAMUSCULAR | Status: DC | PRN
  Administered 2023-11-02 – 2023-11-04 (×2): 50 ug via INTRAVENOUS
  Filled 2023-11-02 (×2): qty 1

## 2023-11-02 NOTE — Progress Notes (Signed)
   11/02/23 0741  Daily Weaning Assessment  Daily Assessment of Readiness to Wean Wean protocol criteria met (SBT performed)  Reason not met Apnea  SBT Method CPAP 5 cm H20 and PS 5 cm H20 (PS 10)  Weaning Start Time 0741  Reason SBT Terminated Apnea   Pt was placed on PS/CPAP but did not tolerate due to apnea. Pt was placed back on full support for now.

## 2023-11-02 NOTE — Procedures (Addendum)
 Patient Name: Miranda Cochran  MRN: [id number]  Epilepsy Attending: Arleene Lack  Referring Physician/Provider: Josiah Nigh, MD  Duration: 11/01/2023 1319 to 11/02/2023 1319   Patient history: 72yo F with right occipital fracture with acute subarachnoid hemorrhage, multiple contusions and a right subdural hematoma. EEG to evaluate for seizure   Level of alertness: lethargic   AEDs during EEG study: LEV   Technical aspects: This EEG study was done with scalp electrodes positioned according to the 10-20 International system of electrode placement. Electrical activity was reviewed with band pass filter of 1-70Hz , sensitivity of 7 uV/mm, display speed of 71mm/sec with a 60Hz  notched filter applied as appropriate. EEG data were recorded continuously and digitally stored.  Video monitoring was available and reviewed as appropriate.   Description: EEG showed near continuous generalized 3 to 6 Hz theta-delta slowing admixed with 12-14hz  beta activity.  Hyperventilation and photic stimulation were not performed.       ABNORMALITY - Continuous slow, generalized    IMPRESSION: This study was suggestive of moderate diffuse encephalopathy. No seizures were noted.    Delitha Elms O Mckyla Deckman

## 2023-11-02 NOTE — Progress Notes (Signed)
 Nutrition Follow-up  DOCUMENTATION CODES:   Severe malnutrition in context of chronic illness  INTERVENTION:   Tube feeding via OG tube: Osmolite 1.5 @ 45 ml/hr (1080 ml per day)  Prosource TF20 60 ml daily  Provides 1700 kcal, 87 gm protein, 820 ml free water daily  100 mg thiamine  daily via tube  MVI with minerals daily via tube   NUTRITION DIAGNOSIS:   Severe Malnutrition related to chronic illness as evidenced by severe fat depletion, severe muscle depletion. Ongoing.   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   5/14 - admit 5/15 - intubated 5/17 - seizures on EEG  5/21 - s/p cortrak placement; tip gastric  Pt discussed during ICU rounds and with RN and MD. Neuro status delaying extubation. Plan to remove EEG.   Spoke with husband at bedside.    Medications reviewed and include: vitamin B12 500 mcg daily, SSI every 4 hours, 4 units novolog  every 4 hours, 22 units semglee  daily, keppra , MVI with minerals, protonix , prednisone , thiamine   Precedex  Fentanyl   (PHOS-NAK TID d/c 5/20) 30 mmol Kphos x 1 5/17, 5/18, 5/19, 5/20   Labs reviewed:  Phos 2.2 -> 4.1 -> 1.6 -> 2.1 -> 1.9 -> 1.8 -> 1.6 -> 4.7 A1C 6.8 CBG's: 156-274    Diet Order:   Diet Order             Diet NPO time specified  Diet effective midnight                   EDUCATION NEEDS:   Not appropriate for education at this time  Skin:  Skin Assessment: Reviewed RN Assessment  Last BM:  5/20  medium  Height:   Ht Readings from Last 1 Encounters:  11/01/23 5\' 4"  (1.626 m)    Weight:   Wt Readings from Last 1 Encounters:  11/02/23 59.6 kg    BMI:  Body mass index is 22.55 kg/m.  Estimated Nutritional Needs:   Kcal:  1600-1800  Protein:  85-100 grams  Fluid:  > 1.6 L/day  Randine Butcher., RD, LDN, CNSC See AMiON for contact information

## 2023-11-02 NOTE — Procedures (Signed)
 Cortrak  Person Inserting Tube:  Devyon Keator T, RD Tube Type:  Cortrak - 43 inches Tube Size:  10 Tube Location:  Left nare Initial Placement:  Stomach Secured by: Bridle Technique Used to Measure Tube Placement:  Marking at nare/corner of mouth Cortrak Secured At:  62 cm   Cortrak Tube Team Note:  Consult received to place a Cortrak feeding tube.   No x-ray is required. RN may begin using tube.   If the tube becomes dislodged please keep the tube and contact the Cortrak team at [email or web address] for replacement.  If after hours and replacement cannot be delayed, place a NG tube and confirm placement with an abdominal x-ray.    Shelle Iron RD, LDN Contact via [employer].

## 2023-11-02 NOTE — Progress Notes (Signed)
 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  Has history of recurrent falls at home for unclear reason per husband at bedside.  Was being managed expectantly with NSGY following when had abrupt reduced LOC and tachyarrythmia.  On my arrival in SV[facility] with pursed lip breathing.  Stat repeat head CT does show enlargement of multiple brain contusions.  NSGY aware and will see patient later today.  PCCM consulted to manage.  Trauma scan neg.  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   Significant Hospital Events: Including procedures, antibiotic start and stop dates in addition to other pertinent events   5/14 admit 5/15 AMS 5/16 Remains on minimal vent settings, hypoglycemic episode this AM, originally following commands overnight. Repeat CT Scan shows mild worsening of bleeding 5/17 Seizures on EEG.  Given additional dose of Keppra , Keppra  dose increased and loaded with phenobarb. CT head with stable extensive  multifocal posttraumatic ICH, no MLS, basilar cisterns remain patent, stable nondisplaced left posterior skull fx 5/18 EEG neg. Sedation off, tolerating SBT 8/5 but not waking up yet 5/19 LE duplex with R DVT, had IVC filter placed as AC not cleared by neurosurgery. CTA chest neg for PE but showed LLL PNA  Interim History / Subjective:  Required Fentanyl  and Precedex yesterday afternoon for agitation and tachycardia.  This AM, all off. She is opening eyes but not following commands though does move lower extremities R > L    Objective    Blood pressure (!) 102/56, pulse 84, temperature 100.2 F (37.9 C), temperature source Axillary, resp. rate (!) 24, height 5\' 4"  (1.626 m), weight 59.6 kg, SpO2 99%.    Vent Mode: PRVC FiO2 (%):  [40 %] 40 % Set Rate:  [24 bmp] 24 bmp Vt Set:  [430 mL] 430 mL PEEP:  [5 cmH20] 5 cmH20 Pressure Support:  [5 cmH20] 5 cmH20 Plateau Pressure:  [15 cmH20-16 cmH20] 16 cmH20   Intake/Output Summary (Last 24 hours) at 11/02/2023 0833 Last data filed at 11/02/2023 0800 Gross per 24 hour  Intake 2086.83 ml  Output 1200 ml  Net 886.83 ml   Filed Weights   10/31/23 0442 11/01/23 0500 11/02/23 0447  Weight: 61.4 kg 59.9 kg 59.6 kg   Examination: General: Adult female, resting in bed, in NAD. Neuro: Opens eyes to voice but not following commands. HEENT: /AT. Sclerae anicteric. ETT in place. Cardiovascular: RRR, no M/R/G.  Lungs: Respirations even and unlabored.  CTA bilaterally, No W/R/R. Abdomen: BS x 4, soft, NT/ND.  Musculoskeletal: No gross deformities, no edema.    Resolved problem list  Hypoglycemia  Assessment and Plan  Acute encephalopathy, inability to protect airway- in context of worsening TBI,  r/o seizure, no herniation on CT. TBI- good deal of traumatic subdural hematoma, SAH, R temporal contusion, L cer LTM per neuro,? Stop date. Likely can stop today 5/21 Repeat CT head if any acute or worsening neuro function F/u on brain MRI once  off EEG Continue Keppra  per neuro Q2hr neuro checks  Acute hypoxic respiratory failure due to hospital-acquired pneumonia - sputum culture 5/17 with E.coli and Strep pneumoniae Continue Unasyn  x 7 days Continue vent support Daily SBT as able, doing well from lung mechanic standpoint, neuro status has been main barrier to considering extubation Bronchial hygiene CXR intermittently  Autoimmune hepatitis/cirrhosis on chronic prednisone  Continue PTA Prednisone   Hyperglycemia SSI  RLE DVT - s/p IVC filter placement 5/19. Not cleared for anticoagulation by neurosurgery. CTA neg for PE. Continue IVC filter. Holding AC until ok with NSGY.  Hypokalemia - being repleted Hypophosphatemia - being repleted Continue repletion as already ordered Continue to monitor BMP  Best Practice (right click and "Reselect all SmartList Selections" daily)   Diet/type: tubefeeds DVT prophylaxis SCD.  Pressure ulcer(s): N/A GI prophylaxis: PPI Lines: Central line Foley:  Yes, and it is still needed Code Status:  full code Last date of multidisciplinary goals of care discussion [updated husband] 5/16   Critical care time: 30 min.    Rafael Bun, PA - [facility] For pager details, please see AMION or use Epic chat  After 1900, please call [facility] for cross coverage needs 11/02/2023, 8:33 AM

## 2023-11-02 NOTE — Progress Notes (Signed)
   11/02/23 1143  Adult Ventilator Settings  Vent Type Servo i  Humidity HME  Vent Mode PSV;CPAP  FiO2 (%) 40 %  Pressure Support 8 cmH20  PEEP 5 cmH20   Pt was placed on PS/CPAP mode due to a decrease in sedation. Pt is tolerating well and maintaining good Mve.

## 2023-11-02 NOTE — Progress Notes (Signed)
 eLink Physician-Brief Progress Note Patient Name: Miranda Cochran DOB: 23-Aug-1950 MRN: [id number]   Date of Service  11/02/2023  HPI/Events of Note  Patient came off of fentanyl  infusion earlier in the day and only fentanyl  back orders are in place.  eICU Interventions  Replace fentanyl  bolus from bag with intermittent fentanyl  pushes to maintain CPOT score     Intervention Category Minor Interventions: Routine modifications to care plan (e.g. PRN medications for pain, fever)  Wilmer Santillo 11/02/2023, 10:22 PM

## 2023-11-02 NOTE — Progress Notes (Signed)
 Pt transported to/from CT on the vent with RN X 2 without incident.

## 2023-11-02 NOTE — Progress Notes (Signed)
 [name], female   DOB: [birth year], 73 y.o.   MRN: [id number] Vital signs remained stable and patient appears to respond slightly better with some command following more consistently today CT scan shows no new changes in the contusions and fluid collections.  Believe her condition is stable and slowly improving.  Will continue to monitor conservatively

## 2023-11-02 NOTE — Progress Notes (Signed)
 NEUROLOGY PROGRESS NOTE   Date of service: Nov 02, 2023 Patient Name: Miranda Cochran MRN:  [id number] DOB:  07-26-1950 Chief Complaint: "seizure " Requesting Provider: Mannam, Praveen, MD  Subjective   - No acute changes - Becoming more alert per nursing  ROS  Unable to ascertain due to altered mental status post sedation and intubated    Current Facility-Administered Medications:    acetaminophen  (TYLENOL ) 160 MG/5ML solution 650 mg, 650 mg, Per Tube, Q4H PRN, Paliwal, Aditya, MD, 650 mg at 11/02/23 0418   cefTRIAXone  (ROCEPHIN ) 2 g in sodium chloride  0.9 % 100 mL IVPB, 2 g, Intravenous, Q24H, Desai, Rahul P, PA-C, Stopped at 11/01/23 1437   Chlorhexidine  Gluconate Cloth 2 % PADS 6 each, 6 each, Topical, Daily, Mannam, Praveen, MD, 6 each at 11/02/23 0430   cyanocobalamin  (VITAMIN B12) tablet 500 mcg, 500 mcg, Per Tube, Daily, Mannam, Praveen, MD, 500 mcg at 11/01/23 0911   dexmedetomidine (PRECEDEX) 400 MCG/100ML (4 mcg/mL) infusion, 0-1.2 mcg/kg/hr, Intravenous, Titrated, Desai, Rahul P, PA-C, Last Rate: 7.49 mL/hr at 11/02/23 0418, 0.5 mcg/kg/hr at 11/02/23 0418   ezetimibe  (ZETIA ) tablet 10 mg, 10 mg, Per Tube, Daily, Mannam, Praveen, MD, 10 mg at 11/01/23 0911   feeding supplement (OSMOLITE 1.5 CAL) liquid 1,000 mL, 1,000 mL, Per Tube, Continuous, Mannam, Praveen, MD, Last Rate: 45 mL/hr at 11/02/23 0800, Infusion Verify at 11/02/23 0800   feeding supplement (PROSource TF20) liquid 60 mL, 60 mL, Per Tube, Daily, Mannam, Praveen, MD, 60 mL at 11/01/23 0902   fentaNYL  (SUBLIMAZE ) bolus via infusion 30 mcg, 30 mcg, Intravenous, Q30 min PRN, Paliwal, Aditya, MD, 30 mcg at 11/01/23 1400   fentaNYL  in NS 250mL (10mcg/ml) infusion-PREMIX, 0-400 mcg/hr, Intravenous, Continuous, Josiah Nigh, MD, Last Rate: 7.5 mL/hr at 11/02/23 0800, 75 mcg/hr at 11/02/23 0800   hydrALAZINE  (APRESOLINE ) injection 10 mg, 10 mg, Intravenous, Q4H PRN, Akula, Vijaya, MD, 10 mg at 11/01/23 1230    insulin  aspart (novoLOG ) injection 0-20 Units, 0-20 Units, Subcutaneous, Q4H, Josiah Nigh, MD, 4 Units at 11/02/23 4098   insulin  aspart (novoLOG ) injection 4 Units, 4 Units, Subcutaneous, Q4H, Desai, Rahul P, PA-C   insulin  glargine-yfgn (SEMGLEE ) injection 22 Units, 22 Units, Subcutaneous, Daily, Desai, Rahul P, PA-C   levETIRAcetam  (KEPPRA ) undiluted injection 1,000 mg, 1,000 mg, Intravenous, Q12H, Kayen Grabel L, MD, 1,000 mg at 11/01/23 2217   multivitamin with minerals tablet 1 tablet, 1 tablet, Per Tube, Daily, Mannam, Praveen, MD, 1 tablet at 11/01/23 0911   Oral care mouth rinse, 15 mL, Mouth Rinse, Q2H, Josiah Nigh, MD, 15 mL at 11/02/23 0745   Oral care mouth rinse, 15 mL, Mouth Rinse, PRN, Josiah Nigh, MD   pantoprazole  (PROTONIX ) injection 40 mg, 40 mg, Intravenous, QHS, Josiah Nigh, MD, 40 mg at 11/01/23 2217   predniSONE  (DELTASONE ) tablet 10 mg, 10 mg, Per Tube, Q breakfast, Mannam, Praveen, MD, 10 mg at 11/01/23 0911   thiamine  (VITAMIN B1) tablet 100 mg, 100 mg, Per Tube, Daily, Mannam, Praveen, MD, 100 mg at 11/01/23 0911  Vitals   Vitals:   11/02/23 0500 11/02/23 0600 11/02/23 0700 11/02/23 0800  BP: (!) 96/56 (!) 97/59 (!) 86/52 (!) 102/56  Pulse: 85 84 81 84  Resp: (!) 24 (!) 24 (!) 24 (!) 24  Temp:      TempSrc:      SpO2: 99% 99% 98% 99%  Weight:      Height:        Body  mass index is 22.55 kg/m.  Physical Exam   Constitutional: Appears ill Eyes: No scleral injection.  HENT: Currently intubated Head: Large scalp hematoma, with dressing in place. EEG leeds in place.  Cardiovascular: Tachycardic  Neurologic Examination  She was sedated intubated with fentanyl  50 mcg  and precedex held for 20 min prior to exam Mental status continues to improve -- eyes open, inconsistently follows some commands, with R hand squeeze and lets go, slightly opens jaw when asked to show tongue. Gave nursing a thumbs up but not for me Pupils were equal round  reactive to light No gaze preference or deviation, does not clearly orient to voice Blinks to threat bilaterally  Face appears grossly symmetric within limits of ETT  RUE purposeful, RLE spontaneous. Minimal movement left upper and lower extremity but grimaces to noxious stim   Labs/Imaging/Neurodiagnostic studies    Basic Metabolic Panel: Recent Labs  Lab 10/29/23 0337 10/29/23 0408 10/30/23 0100 10/30/23 0425 10/31/23 0449 11/01/23 0545 11/02/23 0616  NA 135 134*  --  136 143 141 142  K 3.2* 3.4*  --  3.6 3.6 3.3* 3.7  CL 107  --   --  106 109 108 112*  CO2 19*  --   --  22 23 23 25   GLUCOSE 247*  --   --  134* 156* 269* 143*  BUN 14  --   --  16 10 11 18   CREATININE 0.64  --   --  0.44 0.44 0.39* 0.41*  CALCIUM  6.5*  --   --  6.9* 7.5* 7.5* 8.2*  MG 1.9  --   --  2.1 2.2 2.0 2.1  PHOS 1.6*  --  2.1* 1.9* 1.8* 1.6* 4.7*    CBC: Recent Labs  Lab 10/29/23 0337 10/29/23 0408 10/30/23 0425 10/31/23 0449 11/01/23 0545 11/02/23 0616  WBC 15.5*  --  20.9* 13.7* 11.1* 9.9  HGB 9.2* 9.9* 9.5* 9.9* 9.6* 9.1*  HCT 29.3* 29.0* 27.6* 30.7* 29.8* 29.3*  MCV 102.8*  --  95.8 101.7* 101.7* 102.1*  PLT 182  --  190 179 161 172     Lipid Panel:  Lab Results  Component Value Date   CHOL 195 02/25/2022   HDL 52.70 02/25/2022   LDLCALC 113 (H) 02/25/2022   LDLDIRECT 133.0 09/15/2021   TRIG 111 10/31/2023   CHOLHDL 4 02/25/2022    HgbA1c:  Lab Results  Component Value Date   HGBA1C 6.8 (H) 10/27/2023   Urine Drug Screen:  INR  Lab Results  Component Value Date   INR 1.2 10/27/2023   APTT  Lab Results  Component Value Date   APTT 22 (L) 10/27/2023   Phenobarbital  24.8 post load level on 5/17, 25.9 on 5/19  CT Head without contrast(Personally reviewed): 5/14 IMPRESSION: 1. Mixed subdural and subarachnoid hemorrhage of the right hemisphere, predominantly anterior. No midline shift or other mass effect. 2. Nondisplaced left occipital skull fracture with  large left posterior scalp hematoma. 3. No acute fracture or static subluxation of the cervical spine.  5/15 1. Interval increase in size of an acute subdural hematoma along the right cerebral convexity (greatest along the anterior right frontal lobe), now measuring up to 9 mm in thickness (previously 7 mm). 2. Acute subarachnoid hemorrhage scattered along the right cerebral convexity, increased. 3. Hemorrhagic parenchymal contusions within the anterior right temporal lobe and anteroinferior right frontal lobe have increased in size. 4. Trace acute subarachnoid hemorrhage along the anterolateral left temporal lobe, new from the prior  exam. Possible small underlying left temporal lobe hemorrhagic parenchymal contusions. 5. A 2.3 x 2.6 cm hemorrhagic parenchymal contusion within the left cerebellar hemisphere has increased in size (previously 2.1 x 1.8 cm). 6. Thin subdural hemorrhage layering along the tentorium, bilaterally, similar to the prior exam. 7. Trace subarachnoid hemorrhage (versus prominent choroid plexus) within the inferior aspect of the fourth ventricle. 8. Non-depressed fracture of the left occipital calvarium. 9. Left occipital scalp hematoma and laceration.  5/15 Follow-up/Time 1. An acute subdural hematoma along the right cerebral convexity has undergone some interval redistribution but has not significantly changed in overall extent. 2. Continued slight interval increase in extent of acute subarachnoid hemorrhage scattered along the right cerebral convexity. 3. Progressive edema at sites of hemorrhagic parenchymal contusions within the anteroinferior right frontal lobe and anterior right temporal lobe. 4. Small-volume subarachnoid hemorrhage scattered along the left cerebral convexity has mildly increased. Possible small underlying hemorrhagic parenchymal contusions within the anterolateral left temporal lobe, as before. 5. Thin acute subdural hemorrhage  layering along the tentorium bilaterally, unchanged. 6. 2.3 x 2.6 cm acute parenchymal hemorrhage within the left cerebellar hemisphere, unchanged. 7. Trace subarachnoid hemorrhage again questioned at the inferior aspect of the fourth ventricle. 8. No midline shift or hydrocephalus. 9. Known non-depressed fracture of the left occipital calvarium.  5/16 1. Extensive multifocal posttraumatic intracranial hemorrhage, stable since yesterday with EXCEPT FOR: Small 4 mm mixed but predominantly low-density left Subdural Hematoma has increased (coronal image 46). 2. No midline shift or significant intracranial mass effect despite the findings. No ventriculomegaly. Stable basilar cisterns. 3. Nondisplaced left posterior convexity skull fracture with large  5/17 1. Stable extensive multifocal posttraumatic intracranial hemorrhage since yesterday. No midline shift. Basilar cisterns remain patent. 2. No new intracranial abnormality. 3. Stable nondisplaced left posterior skull fracture. Scalp hematomas.   CT C-spine 5/14 No acute fracture or static subluxation of the cervical spine.  VAS US  lower extremity 5/19 - Findings consistent with acute deep vein thrombosis involving the right peroneal veins.     CT A PE: 5/19 Negative for pulmonary artery embolus    Neurodiagnostics  Continuous EEG  10/27/2023 to 10/28/2023 This study is suggestive of cortical dysfunction arising from right hemisphere likely secondary to underlying structural abnormality. Additionally there is moderate to severe diffuse encephalopathy likely related to sedation. No seizures or epileptiform discharges were seen throughout the recording 10/28/2023 to 10/29/2023 This study was initially suggestive of cortical dysfunction arising from right hemisphere likely secondary to underlying structural abnormality. Additionally there was moderate to severe diffuse encephalopathy likely related to sedation.  After around 1746 on  10/28/2023, EEG worsened and showed seizures without clinical signs arising from right frontal region. Average 4 seizures were noted per hour, lasting about 1 minute each. 10/29/2023 1319 hrs. to 10/30/2023 0730 hrs Continuous slow, generalized and lateralized right hemisphere.  Study suggestive of cortical dysfunction or arising from right hemisphere likely secondary to underlying structural abnormality.  Additionally moderate to severe diffuse encephalopathy likely related to sedation.  No seizures seen. 10/30/2023 1319 to 10/31/2023 0700  - Continuous slow, generalized; This study was suggestive of moderate diffuse encephalopathy. No seizures were noted.  10/31/2023 1319 to 11/01/2023 0900  This study was suggestive of moderate diffuse encephalopathy. No seizures were noted.   11/01/2023 0900 -- today  Report pending  ASSESSMENT   KAROLYNN INFANTINO is a 73 y.o. 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 Patient arrived on  5/14 via EMS for evaluation after a fall and hit her head.    Imaging concerning for occipital fracture with acute subarachnoid hemorrhage, multiple contusions and a right subdural hematoma. Most recent CT demonstrates extensive multifocal posttraumatic intracranial hemorrhage.   Repeat CT after admission with some worsening but that has stabilized on further imaging.  5/20 found to have right lower extremity DVT. Due to ICH and inability to anticoagulate IVC filter was placed via IR on 5/19. Appreciate neurosurgical guidance on when [facility] could be considered  Initially loaded with Keppra  due to electrographic seizures, Keppra  dose increased, continue to have electrographic seizures for which phenobarb was added.  Upon increasing dose of Keppra  and addition of phenobarb on 5/17 - electrographically, the seizures have subsided. Phenobarbital  was discontinued on 5/19 to help minimize sedation  effects. Now remains on only on renal appropriate dose of Keppra . Will continue to monitor as phenobarb is cleared from her system (~80 hour t1/2), so just approaching first 1/2 life today.   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  RECOMMENDATIONS  -- Low threshold to repeat head CT if acute or worsening neurological function -- continue LTM EEG for now, will follow-up report and clinical exam, may discontinue today or tomorrow  -- MRI brain w/ and w/o contrast due to autoimmune history and age once off of EEG  -- Continue Keppra  1000 twice daily (max for her renal function with CrCl 55) -- Neurology will follow along  Discussed with Dr. Waylan Haggard via secure chat   Baldwin Levee MD-PhD [facility] [phone]   CRITICAL CARE ATTESTATION Performed by: Ronnette Coke  Total critical care time: 30 minutes Critical care time was exclusive of separately billable procedures and treating other patients and/or supervising APPs/Residents/Students Critical care was necessary to treat or prevent imminent or life-threatening deterioration. This patient is critically ill and at significant risk for neurological worsening and/or death and care requires constant monitoring. Critical care was time spent personally by me on the following activities: development of treatment plan with patient and/or surrogate as well as nursing, discussions with consultants, evaluation of patient's response to treatment, examination of patient, obtaining history from patient or surrogate, ordering and performing treatments and interventions, ordering and review of laboratory studies, ordering and review of radiographic studies, pulse oximetry, re-evaluation of patient's condition, participation in multidisciplinary rounds  and medical decision making of high complexity in the care of this patient.

## 2023-11-03 ENCOUNTER — Encounter (HOSPITAL_COMMUNITY)

## 2023-11-03 ENCOUNTER — Inpatient Hospital Stay (HOSPITAL_COMMUNITY)

## 2023-11-03 DIAGNOSIS — S02119A Unspecified fracture of occiput, initial encounter for closed fracture: Secondary | ICD-10-CM | POA: Diagnosis not present

## 2023-11-03 DIAGNOSIS — S066XAA Traumatic subarachnoid hemorrhage with loss of consciousness status unknown, initial encounter: Secondary | ICD-10-CM | POA: Diagnosis not present

## 2023-11-03 DIAGNOSIS — R569 Unspecified convulsions: Secondary | ICD-10-CM | POA: Diagnosis not present

## 2023-11-03 DIAGNOSIS — G9340 Encephalopathy, unspecified: Secondary | ICD-10-CM | POA: Diagnosis not present

## 2023-11-03 DIAGNOSIS — G40909 Epilepsy, unspecified, not intractable, without status epilepticus: Secondary | ICD-10-CM

## 2023-11-03 DIAGNOSIS — S065XAA Traumatic subdural hemorrhage with loss of consciousness status unknown, initial encounter: Secondary | ICD-10-CM | POA: Diagnosis not present

## 2023-11-03 DIAGNOSIS — E876 Hypokalemia: Secondary | ICD-10-CM | POA: Diagnosis not present

## 2023-11-03 LAB — BASIC METABOLIC PANEL WITH GFR
Anion gap: 8 (ref 5–15)
BUN: 18 mg/dL (ref 8–23)
CO2: 23 mmol/L (ref 22–32)
Calcium: 7.9 mg/dL — ABNORMAL LOW (ref 8.9–10.3)
Chloride: 108 mmol/L (ref 98–111)
Creatinine, Ser: 0.45 mg/dL (ref 0.44–1.00)
GFR, Estimated: >60 mL/min (ref >60)
Glucose, Bld: 166 mg/dL — ABNORMAL HIGH (ref 70–99)
Potassium: 3.6 mmol/L (ref 3.5–5.1)
Sodium: 139 mmol/L (ref 135–145)

## 2023-11-03 LAB — CBC
HCT: 28.1 % — ABNORMAL LOW (ref 36.0–46.0)
Hemoglobin: 8.9 g/dL — ABNORMAL LOW (ref 12.0–15.0)
MCH: 31.9 pg (ref 26.0–34.0)
MCHC: 31.7 g/dL (ref 30.0–36.0)
MCV: 100.7 fL — ABNORMAL HIGH (ref 80.0–100.0)
Platelets: 148 10*3/uL — ABNORMAL LOW (ref 150–400)
RBC: 2.79 MIL/uL — ABNORMAL LOW (ref 3.87–5.11)
RDW: 14.3 % (ref 11.5–15.5)
WBC: 7.4 10*3/uL (ref 4.0–10.5)
nRBC: 0 % (ref 0.0–0.2)

## 2023-11-03 LAB — MAGNESIUM: Magnesium: 2 mg/dL (ref 1.7–2.4)

## 2023-11-03 LAB — GLUCOSE, CAPILLARY
Glucose-Capillary: 169 mg/dL — ABNORMAL HIGH (ref 70–99)
Glucose-Capillary: 165 mg/dL — ABNORMAL HIGH (ref 70–99)
Glucose-Capillary: 249 mg/dL — ABNORMAL HIGH (ref 70–99)
Glucose-Capillary: 254 mg/dL — ABNORMAL HIGH (ref 70–99)
Glucose-Capillary: 249 mg/dL — ABNORMAL HIGH (ref 70–99)

## 2023-11-03 LAB — PHOSPHORUS: Phosphorus: 2.9 mg/dL (ref 2.5–4.6)

## 2023-11-03 MED ORDER — POTASSIUM CHLORIDE 20 MEQ PO PACK
40.0000 meq | PACK | Freq: Once | ORAL | Status: AC
  Administered 2023-11-03: 40 meq
  Filled 2023-11-03: qty 2

## 2023-11-03 MED ORDER — LEVETIRACETAM 500 MG PO TABS
1000.0000 mg | ORAL_TABLET | Freq: Two times a day (BID) | ORAL | Status: DC
Start: 2023-11-03 — End: 2023-11-11
  Administered 2023-11-03 – 2023-11-11 (×17): 1000 mg
  Filled 2023-11-03 (×2): qty 4
  Filled 2023-11-03 (×2): qty 2
  Filled 2023-11-03 (×4): qty 4
  Filled 2023-11-03 (×2): qty 2
  Filled 2023-11-03: qty 4
  Filled 2023-11-03 (×2): qty 2
  Filled 2023-11-03: qty 4
  Filled 2023-11-03: qty 2
  Filled 2023-11-03: qty 4
  Filled 2023-11-03 (×2): qty 2

## 2023-11-03 MED ORDER — BANATROL TF EN LIQD
60.0000 mL | Freq: Two times a day (BID) | ENTERAL | Status: DC
  Administered 2023-11-03 – 2023-11-11 (×17): 60 mL
  Filled 2023-11-03 (×17): qty 60

## 2023-11-03 NOTE — Plan of Care (Signed)
  Problem: Clinical Measurements: Goal: Neurologic status will improve Outcome: Progressing   Problem: Respiratory: Goal: Will regain and/or maintain adequate ventilation Outcome: Progressing   Problem: Skin Integrity: Goal: Risk for impaired skin integrity will decrease Outcome: Progressing   Problem: Nutritional: Goal: Risk of aspiration will decrease Outcome: Progressing

## 2023-11-03 NOTE — Progress Notes (Signed)
 NEUROLOGY PROGRESS NOTE   Date of service: Nov 03, 2023 Patient Name: Miranda Cochran MRN:  [id number] DOB:  Jan 09, 1951 Chief Complaint: "seizure " Requesting Provider: Mannam, Praveen, MD  Subjective   - No acute changes - Off fentanyl   - Becoming more alert per husband yesterday, eyes open, blinking twice for yes and no once when questioned.   ROS  Unable to ascertain due to altered mental status post sedation and intubated   Current Facility-Administered Medications:    acetaminophen  (TYLENOL ) 160 MG/5ML solution 650 mg, 650 mg, Per Tube, Q4H PRN, Paliwal, Aditya, MD, 650 mg at 11/03/23 0357   cefTRIAXone  (ROCEPHIN ) 2 g in sodium chloride  0.9 % 100 mL IVPB, 2 g, Intravenous, Q24H, Desai, Rahul P, PA-C, Stopped at 11/02/23 1429   Chlorhexidine  Gluconate Cloth 2 % PADS 6 each, 6 each, Topical, Daily, Mannam, Praveen, MD, 6 each at 11/03/23 0000   cyanocobalamin  (VITAMIN B12) tablet 500 mcg, 500 mcg, Per Tube, Daily, Mannam, Praveen, MD, 500 mcg at 11/02/23 0857   dexmedetomidine (PRECEDEX) 400 MCG/100ML (4 mcg/mL) infusion, 0-1.2 mcg/kg/hr, Intravenous, Titrated, Desai, Rahul P, PA-C, Last Rate: 7.49 mL/hr at 11/03/23 0800, 0.5 mcg/kg/hr at 11/03/23 0800   ezetimibe  (ZETIA ) tablet 10 mg, 10 mg, Per Tube, Daily, Mannam, Praveen, MD, 10 mg at 11/02/23 0856   feeding supplement (OSMOLITE 1.5 CAL) liquid 1,000 mL, 1,000 mL, Per Tube, Continuous, Mannam, Praveen, MD, Last Rate: 45 mL/hr at 11/03/23 0800, Infusion Verify at 11/03/23 0800   feeding supplement (PROSource TF20) liquid 60 mL, 60 mL, Per Tube, Daily, Mannam, Praveen, MD, 60 mL at 11/02/23 0856   fentaNYL  (SUBLIMAZE ) injection 50 mcg, 50 mcg, Intravenous, Q1H PRN, Paliwal, Aditya, MD, 50 mcg at 11/02/23 2357   hydrALAZINE  (APRESOLINE ) injection 10 mg, 10 mg, Intravenous, Q4H PRN, Akula, Vijaya, MD, 10 mg at 11/01/23 1230   insulin  aspart (novoLOG ) injection 0-20 Units, 0-20 Units, Subcutaneous, Q4H, Josiah Nigh, MD, 4 Units at  11/03/23 4098   insulin  aspart (novoLOG ) injection 4 Units, 4 Units, Subcutaneous, Q4H, Desai, Rahul P, PA-C, 4 Units at 11/03/23 1191   insulin  glargine-yfgn (SEMGLEE ) injection 22 Units, 22 Units, Subcutaneous, Daily, Desai, Rahul P, PA-C, 22 Units at 11/02/23 1051   levETIRAcetam  (KEPPRA ) undiluted injection 1,000 mg, 1,000 mg, Intravenous, Q12H, Ramiel Forti L, MD, 1,000 mg at 11/02/23 2150   multivitamin with minerals tablet 1 tablet, 1 tablet, Per Tube, Daily, Mannam, Praveen, MD, 1 tablet at 11/02/23 0856   Oral care mouth rinse, 15 mL, Mouth Rinse, Q2H, Josiah Nigh, MD, 15 mL at 11/03/23 4782   Oral care mouth rinse, 15 mL, Mouth Rinse, PRN, Josiah Nigh, MD   pantoprazole  (PROTONIX ) injection 40 mg, 40 mg, Intravenous, QHS, Josiah Nigh, MD, 40 mg at 11/02/23 2150   predniSONE  (DELTASONE ) tablet 10 mg, 10 mg, Per Tube, Q breakfast, Mannam, Praveen, MD, 10 mg at 11/03/23 0734   thiamine  (VITAMIN B1) tablet 100 mg, 100 mg, Per Tube, Daily, Mannam, Praveen, MD, 100 mg at 11/02/23 0856  Vitals   Vitals:   11/03/23 0600 11/03/23 0700 11/03/23 0800 11/03/23 0804  BP: 123/69 123/71 117/61   Pulse: 94 87 81   Resp: (!) 27 12 (!) 24   Temp:    99.6 F (37.6 C)  TempSrc:    Axillary  SpO2: 100% 100% 100%   Weight:      Height:        Body mass index is 22.93 kg/m.  Physical Exam  Constitutional: Appears ill Eyes: No scleral injection.  HENT: Currently intubated Head: Large scalp hematoma, with dressing in place. EEG leeds in place.  Cardiovascular: Tachycardic  Neurologic Examination  She was sedated, intubated precedex held for 30 mins prior to exam Mental status continues to improve -- eyes open, follows some commands, with R hand squeeze and lets go, thumbs up, 2 fingers all on the right side, Pupils were equal round reactive to light No gaze preference or deviation, orients to voice bilaterally Blinks to threat bilaterally  Face appears grossly symmetric  within limits of ETT  RUE purposeful, RLE spontaneous. Trace movement left upper to command 1/5, minimal elft lower extremity movement  Labs/Imaging/Neurodiagnostic studies   Basic Metabolic Panel: Recent Labs  Lab 10/30/23 0425 10/31/23 0449 11/01/23 0545 11/02/23 0616 11/03/23 0529  NA 136 143 141 142 139  K 3.6 3.6 3.3* 3.7 3.6  CL 106 109 108 112* 108  CO2 22 23 23 25 23   GLUCOSE 134* 156* 269* 143* 166*  BUN 16 10 11 18 18   CREATININE 0.44 0.44 0.39* 0.41* 0.45  CALCIUM  6.9* 7.5* 7.5* 8.2* 7.9*  MG 2.1 2.2 2.0 2.1 2.0  PHOS 1.9* 1.8* 1.6* 4.7* 2.9    CBC: Recent Labs  Lab 10/30/23 0425 10/31/23 0449 11/01/23 0545 11/02/23 0616 11/03/23 0529  WBC 20.9* 13.7* 11.1* 9.9 7.4  HGB 9.5* 9.9* 9.6* 9.1* 8.9*  HCT 27.6* 30.7* 29.8* 29.3* 28.1*  MCV 95.8 101.7* 101.7* 102.1* 100.7*  PLT 190 179 161 172 148*     Lipid Panel:  Lab Results  Component Value Date   CHOL 195 02/25/2022   HDL 52.70 02/25/2022   LDLCALC 113 (H) 02/25/2022   LDLDIRECT 133.0 09/15/2021   TRIG 111 10/31/2023   CHOLHDL 4 02/25/2022    HgbA1c:  Lab Results  Component Value Date   HGBA1C 6.8 (H) 10/27/2023   Urine Drug Screen:  INR  Lab Results  Component Value Date   INR 1.2 10/27/2023   APTT  Lab Results  Component Value Date   APTT 22 (L) 10/27/2023   Phenobarbital  24.8 post load level on 5/17, 25.9 on 5/19  CT Head without contrast(Personally reviewed): 5/14 IMPRESSION: 1. Mixed subdural and subarachnoid hemorrhage of the right hemisphere, predominantly anterior. No midline shift or other mass effect. 2. Nondisplaced left occipital skull fracture with large left posterior scalp hematoma. 3. No acute fracture or static subluxation of the cervical spine.  5/15 1. Interval increase in size of an acute subdural hematoma along the right cerebral convexity (greatest along the anterior right frontal lobe), now measuring up to 9 mm in thickness (previously 7 mm). 2. Acute  subarachnoid hemorrhage scattered along the right cerebral convexity, increased. 3. Hemorrhagic parenchymal contusions within the anterior right temporal lobe and anteroinferior right frontal lobe have increased in size. 4. Trace acute subarachnoid hemorrhage along the anterolateral left temporal lobe, new from the prior exam. Possible small underlying left temporal lobe hemorrhagic parenchymal contusions. 5. A 2.3 x 2.6 cm hemorrhagic parenchymal contusion within the left cerebellar hemisphere has increased in size (previously 2.1 x 1.8 cm). 6. Thin subdural hemorrhage layering along the tentorium, bilaterally, similar to the prior exam. 7. Trace subarachnoid hemorrhage (versus prominent choroid plexus) within the inferior aspect of the fourth ventricle. 8. Non-depressed fracture of the left occipital calvarium. 9. Left occipital scalp hematoma and laceration.  5/15 Follow-up/Time 1. An acute subdural hematoma along the right cerebral convexity has undergone some interval redistribution but has not significantly  changed in overall extent. 2. Continued slight interval increase in extent of acute subarachnoid hemorrhage scattered along the right cerebral convexity. 3. Progressive edema at sites of hemorrhagic parenchymal contusions within the anteroinferior right frontal lobe and anterior right temporal lobe. 4. Small-volume subarachnoid hemorrhage scattered along the left cerebral convexity has mildly increased. Possible small underlying hemorrhagic parenchymal contusions within the anterolateral left temporal lobe, as before. 5. Thin acute subdural hemorrhage layering along the tentorium bilaterally, unchanged. 6. 2.3 x 2.6 cm acute parenchymal hemorrhage within the left cerebellar hemisphere, unchanged. 7. Trace subarachnoid hemorrhage again questioned at the inferior aspect of the fourth ventricle. 8. No midline shift or hydrocephalus. 9. Known non-depressed fracture of the  left occipital calvarium.  5/16 1. Extensive multifocal posttraumatic intracranial hemorrhage, stable since yesterday with EXCEPT FOR: Small 4 mm mixed but predominantly low-density left Subdural Hematoma has increased (coronal image 46). 2. No midline shift or significant intracranial mass effect despite the findings. No ventriculomegaly. Stable basilar cisterns. 3. Nondisplaced left posterior convexity skull fracture with large  5/17 1. Stable extensive multifocal posttraumatic intracranial hemorrhage since yesterday. No midline shift. Basilar cisterns remain patent. 2. No new intracranial abnormality. 3. Stable nondisplaced left posterior skull fracture. Scalp hematomas.    5/21 No progression of multifocal ICH as described. No mass effect or hydrocephalus.   Perforator infarct in the right corona radiata that has occurred since admission.  CT C-spine 5/14 No acute fracture or static subluxation of the cervical spine.  VAS US  lower extremity 5/19 - Findings consistent with acute deep vein thrombosis involving the right peroneal veins.     CT A PE: 5/19 Negative for pulmonary artery embolus    Neurodiagnostics  Continuous EEG  10/27/2023 to 10/28/2023 This study is suggestive of cortical dysfunction arising from right hemisphere likely secondary to underlying structural abnormality. Additionally there is moderate to severe diffuse encephalopathy likely related to sedation. No seizures or epileptiform discharges were seen throughout the recording 10/28/2023 to 10/29/2023 This study was initially suggestive of cortical dysfunction arising from right hemisphere likely secondary to underlying structural abnormality. Additionally there was moderate to severe diffuse encephalopathy likely related to sedation.  After around 1746 on 10/28/2023, EEG worsened and showed seizures without clinical signs arising from right frontal region. Average 4 seizures were noted per hour, lasting about  1 minute each. 10/29/2023 1319 hrs. to 10/30/2023 0730 hrs Continuous slow, generalized and lateralized right hemisphere.  Study suggestive of cortical dysfunction or arising from right hemisphere likely secondary to underlying structural abnormality.  Additionally moderate to severe diffuse encephalopathy likely related to sedation.  No seizures seen. 10/30/2023 1319 to 10/31/2023 0700  - Continuous slow, generalized; This study was suggestive of moderate diffuse encephalopathy. No seizures were noted.  10/31/2023 1319 to 11/01/2023 0900  This study was suggestive of moderate diffuse encephalopathy. No seizures were noted.   11/01/2023 1319 to 11/02/2023 0930  This study was suggestive of moderate diffuse encephalopathy. No seizures were noted.   ASSESSMENT   Miranda Cochran is a 73 y.o. 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 Patient arrived on 5/14 via EMS for evaluation after a fall and hit her head.    Imaging concerning for occipital fracture with acute subarachnoid hemorrhage, multiple contusions and a right subdural hematoma. Most recent CT demonstrates extensive multifocal posttraumatic intracranial hemorrhage.   Repeat CT after admission with some worsening but that has stabilized on further imaging.  5/20 found  to have right lower extremity DVT. Due to ICH and inability to anticoagulate IVC filter was placed via IR on 5/19. Appreciate neurosurgical guidance on when [facility] could be considered  Initially loaded with Keppra  due to electrographic seizures, Keppra  dose increased, continue to have electrographic seizures for which phenobarb was added.  Upon increasing dose of Keppra  and addition of phenobarb on 5/17 - electrographically, the seizures have subsided. Phenobarbital  was discontinued on 5/19 to help minimize sedation effects. Now remains on only on renal appropriate dose of Keppra . 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   RECOMMENDATIONS  -- Low threshold to repeat head CT if acute or worsening neurological function -- continue LTM EEG for now, will follow-up report and clinical exam, likely discontinue today if remains negative -- Consider MRI brain once off EEG  -- Continue Keppra  1000 twice daily (max for her renal function with CrCl 55), okay to transition to per tube -- Neurology will follow  Discussed with Dr. Waylan Haggard via secure chat  Brayton Calin, MD [facility] PGY-1   Attending Neurologist's note:  I personally saw this patient, gathering history, performing a full neurologic examination, reviewing relevant labs, personally reviewing relevant imaging including head CT, and formulated the assessment and plan, adding the note above for completeness and clarity to accurately reflect my thoughts  Baldwin Levee MD-PhD [facility] [phone]  CRITICAL CARE ATTESTATION Performed by: Ronnette Coke  Total critical care time: 30 minutes Critical care time was exclusive of separately billable procedures and treating other patients and/or supervising APPs/Residents/Students Critical care was necessary to treat or prevent imminent or life-threatening deterioration. This patient is critically ill and at significant risk for neurological worsening and/or death and care requires constant monitoring. Critical care was time spent personally by me on the following activities: development of treatment plan with patient and/or surrogate as well as nursing, discussions with consultants, evaluation of patient's response to treatment,  examination of patient, obtaining history from patient or surrogate, ordering and performing treatments and interventions, ordering and review of laboratory studies, ordering and review of radiographic studies, pulse oximetry, re-evaluation of patient's condition, participation in multidisciplinary rounds and medical decision making of high complexity in the care of this patient.

## 2023-11-03 NOTE — Progress Notes (Signed)
 LTM VIDEO EEG discontinued - no skin breakdown at [facility]

## 2023-11-03 NOTE — Progress Notes (Signed)
 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  Has history of recurrent falls at home for unclear reason per husband at bedside.  Was being managed expectantly with NSGY following when had abrupt reduced LOC and tachyarrythmia.  On my arrival in [place] with pursed lip breathing.  Stat repeat head CT does show enlargement of multiple brain contusions.  NSGY aware and will see patient later today.  PCCM consulted to manage.  Trauma scan neg.  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   Significant Hospital Events: Including procedures, antibiotic start and stop dates in addition to other pertinent events   5/14 admit 5/15 AMS 5/16 Remains on minimal vent settings, hypoglycemic episode this AM, originally following commands overnight. Repeat CT Scan shows mild worsening of bleeding 5/17 Seizures on EEG.  Given additional dose of Keppra , Keppra  dose increased and loaded with phenobarb. CT head with stable extensive  multifocal posttraumatic ICH, no MLS, basilar cisterns remain patent, stable nondisplaced left posterior skull fx 5/18 EEG neg. Sedation off, tolerating SBT 8/5 but not waking up yet 5/19 LE duplex with R DVT, had IVC filter placed as AC not cleared by neurosurgery. CTA chest neg for PE but showed LLL PNA On precedex, tolerating SBT, no further sz EEG  Interim History / Subjective:  Agitation improved with precedex Tolerating SBT but still somewhat somnolent   Objective    Blood pressure (!) 134/97, pulse 100, temperature 99.6 F (37.6 C), temperature source Axillary, resp. rate (!) 27, height 5\' 4"  (1.626 m), weight 60.6 kg, SpO2 100%.    Vent Mode: PSV;CPAP FiO2 (%):  [40 %] 40 % Set Rate:  [24 bmp] 24 bmp Vt Set:  [430 mL] 430 mL PEEP:  [5 cmH20] 5 cmH20 Pressure Support:  [8 cmH20] 8 cmH20 Plateau Pressure:  [14 cmH20-18 cmH20] 16 cmH20   Intake/Output Summary (Last 24 hours) at 11/03/2023 1158 Last data filed at 11/03/2023 1130 Gross per 24 hour  Intake 1310.49 ml  Output 2250 ml  Net -939.51 ml   Filed Weights   11/01/23 0500 11/02/23 0447 11/03/23 0500  Weight: 59.9 kg 59.6 kg 60.6 kg   General:  critically ill-appearing F intubated on precedex in NAD HEENT: MM pink/moist, ETT in place sclera anicteric Neuro: opens eyes to voice, moving the RUE and RLE to commands, slight movement in the LLE CV: s1s2 tachycardic, no m/r/g PULM:  mildly diminished in the bilateral bases without rhonchi or wheezing  GI: soft, bsx4 active  Extremities: warm/dry, no edema     Resolved problem list  Hypoglycemia  Assessment and Plan   Acute encephalopathy, inability to protect airway- in context of worsening TBI, r/o seizure, no  herniation on CT. TBI- good deal of traumatic subdural hematoma, SAH, R temporal contusion, -continued LTM per neuro, likely d/c today if remains negative  -Repeat CT head if any acute or worsening neuro function -F/u on brain MRI once off EEG -Continue  Keppra  per neuro -Q2hr neuro checks  Acute hypoxic respiratory failure due to hospital-acquired pneumonia - sputum culture 5/17 with E.coli and Strep pneumoniae Mental status has been the barrier to extubation, tolerating PSV 8/5 and better on precedex, but think likely attempt extubation tomorrow  -Continue Unasyn  x 7 days --Maintain full vent support with SAT/SBT as tolerated -titrate Vent setting to maintain SpO2 greater than or equal to 90%. -HOB elevated 30 degrees. -Plateau pressures less than 30 cm H20.  -Follow chest x-ray, ABG prn.   -Bronchial hygiene and RT/bronchodilator protocol.   Autoimmune hepatitis/cirrhosis on chronic prednisone  -Continue PTA Prednisone   Hyperglycemia -SSI  RLE DVT - s/p IVC filter placement 5/19. Not cleared for anticoagulation by neurosurgery. CTA neg for PE. -Continue IVC filter. -Holding AC until ok with NSGY.  Hypokalemia - being repleted Hypophosphatemia - being repleted -Continue repletion as already ordered -Continue to monitor BMP  Best Practice (right click and "Reselect all SmartList Selections" daily)   Diet/type: tubefeeds DVT prophylaxis SCD.  Pressure ulcer(s): N/A GI prophylaxis: PPI Lines: Central line Foley:  Yes, and it is still needed Code Status:  full code Last date of multidisciplinary goals of care discussion [updated husband 5/22  Critical care time: 40 min.    CRITICAL CARE Performed by: Patt Boozer Dartanyon Frankowski   Total critical care time: 40 minutes  Critical care time was exclusive of separately billable procedures and treating other patients.  Critical care was necessary to treat or prevent imminent or life-threatening deterioration.  Critical care was time spent personally by me on the following activities: development of treatment plan with patient and/or surrogate as well as nursing, discussions with consultants, evaluation of patient's response to treatment, examination of patient, obtaining history from  patient or surrogate, ordering and performing treatments and interventions, ordering and review of laboratory studies, ordering and review of radiographic studies, pulse oximetry and re-evaluation of patient's condition.   Patt Boozer Zedrick Springsteen, PA-C [facility] See Amion for pager If no response to pager , please call [phone] until 7pm After 7:00 pm call Elink  [phone]

## 2023-11-03 NOTE — Procedures (Addendum)
 Patient Name: LAMAR NAEF  MRN: [id number]  Epilepsy Attending: Arleene Lack  Referring Physician/Provider: Josiah Nigh, MD  Duration: 11/02/2023 1319 to 11/03/2023 1319   Patient history: 72yo F with right occipital fracture with acute subarachnoid hemorrhage, multiple contusions and a right subdural hematoma. EEG to evaluate for seizure   Level of alertness: awake, asleep   AEDs during EEG study: LEV   Technical aspects: This EEG study was done with scalp electrodes positioned according to the 10-20 International system of electrode placement. Electrical activity was reviewed with band pass filter of 1-70Hz , sensitivity of 7 uV/mm, display speed of 56mm/sec with a 60Hz  notched filter applied as appropriate. EEG data were recorded continuously and digitally stored.  Video monitoring was available and reviewed as appropriate.   Description: The posterior dominant rhythm consists of 8 Hz activity of moderate voltage (25-35 uV) seen predominantly in posterior head regions, symmetric and reactive to eye opening and eye closing. Sleep was characterized by vertex waves, sleep spindles (12 to 14 Hz), maximal frontocentral region. EEG showed intermittent generalized 5-7hz  theta slowing admixed with intermittent 2-3hz  delta slowing. Hyperventilation and photic stimulation were not performed.      ABNORMALITY - Intermittent slow, generalized   IMPRESSION: This study is suggestive of mild to moderate diffuse encephalopathy. No seizures were noted.   Rennae Ferraiolo O Dastan Krider

## 2023-11-03 NOTE — Progress Notes (Signed)
 vLTM maintenance  All impedances below 10k.  Noskin breakdown noted t  FZ CZ PZ C4

## 2023-11-03 NOTE — Progress Notes (Signed)
 No charge note  Given steady clinical progress and multiple days of LTM EEG negative for seizure will discontinue EEG at this time

## 2023-11-04 ENCOUNTER — Inpatient Hospital Stay (HOSPITAL_COMMUNITY)

## 2023-11-04 DIAGNOSIS — G40901 Epilepsy, unspecified, not intractable, with status epilepticus: Secondary | ICD-10-CM | POA: Diagnosis not present

## 2023-11-04 DIAGNOSIS — R569 Unspecified convulsions: Secondary | ICD-10-CM | POA: Diagnosis not present

## 2023-11-04 DIAGNOSIS — S065XAA Traumatic subdural hemorrhage with loss of consciousness status unknown, initial encounter: Secondary | ICD-10-CM | POA: Diagnosis not present

## 2023-11-04 DIAGNOSIS — S066XAA Traumatic subarachnoid hemorrhage with loss of consciousness status unknown, initial encounter: Secondary | ICD-10-CM | POA: Diagnosis not present

## 2023-11-04 DIAGNOSIS — G9340 Encephalopathy, unspecified: Secondary | ICD-10-CM | POA: Diagnosis not present

## 2023-11-04 LAB — MAGNESIUM: Magnesium: 2 mg/dL (ref 1.7–2.4)

## 2023-11-04 LAB — CBC
HCT: 29.9 % — ABNORMAL LOW (ref 36.0–46.0)
Hemoglobin: 9.4 g/dL — ABNORMAL LOW (ref 12.0–15.0)
MCH: 32.3 pg (ref 26.0–34.0)
MCHC: 31.4 g/dL (ref 30.0–36.0)
MCV: 102.7 fL — ABNORMAL HIGH (ref 80.0–100.0)
Platelets: 155 10*3/uL (ref 150–400)
RBC: 2.91 MIL/uL — ABNORMAL LOW (ref 3.87–5.11)
RDW: 14.4 % (ref 11.5–15.5)
WBC: 8.1 10*3/uL (ref 4.0–10.5)
nRBC: 0 % (ref 0.0–0.2)

## 2023-11-04 LAB — POCT I-STAT 7, (LYTES, BLD GAS, ICA,H+H)
Acid-base deficit: 1 mmol/L (ref 0.0–2.0)
Bicarbonate: 20.7 mmol/L (ref 20.0–28.0)
Calcium, Ion: 1.13 mmol/L — ABNORMAL LOW (ref 1.15–1.40)
HCT: 28 % — ABNORMAL LOW (ref 36.0–46.0)
Hemoglobin: 9.5 g/dL — ABNORMAL LOW (ref 12.0–15.0)
O2 Saturation: 97 %
Patient temperature: 100.1
Potassium: 4.1 mmol/L (ref 3.5–5.1)
Sodium: 132 mmol/L — ABNORMAL LOW (ref 135–145)
TCO2: 21 mmol/L — ABNORMAL LOW (ref 22–32)
pCO2 arterial: 26.5 mmHg — ABNORMAL LOW (ref 32–48)
pH, Arterial: 7.503 — ABNORMAL HIGH (ref 7.35–7.45)
pO2, Arterial: 87 mmHg (ref 83–108)

## 2023-11-04 LAB — LIPID PANEL
Cholesterol: 163 mg/dL (ref 0–200)
HDL: 31 mg/dL — ABNORMAL LOW (ref >40)
LDL Cholesterol: 106 mg/dL — ABNORMAL HIGH (ref 0–99)
Total CHOL/HDL Ratio: 5.3 ratio
Triglycerides: 128 mg/dL (ref <150)
VLDL: 26 mg/dL (ref 0–40)

## 2023-11-04 LAB — GLUCOSE, CAPILLARY
Glucose-Capillary: 174 mg/dL — ABNORMAL HIGH (ref 70–99)
Glucose-Capillary: 182 mg/dL — ABNORMAL HIGH (ref 70–99)
Glucose-Capillary: 205 mg/dL — ABNORMAL HIGH (ref 70–99)
Glucose-Capillary: 214 mg/dL — ABNORMAL HIGH (ref 70–99)
Glucose-Capillary: 215 mg/dL — ABNORMAL HIGH (ref 70–99)
Glucose-Capillary: 226 mg/dL — ABNORMAL HIGH (ref 70–99)
Glucose-Capillary: 226 mg/dL — ABNORMAL HIGH (ref 70–99)

## 2023-11-04 LAB — PHOSPHORUS: Phosphorus: 3.1 mg/dL (ref 2.5–4.6)

## 2023-11-04 LAB — BASIC METABOLIC PANEL WITH GFR
Anion gap: 10 (ref 5–15)
BUN: 17 mg/dL (ref 8–23)
CO2: 21 mmol/L — ABNORMAL LOW (ref 22–32)
Calcium: 8.3 mg/dL — ABNORMAL LOW (ref 8.9–10.3)
Chloride: 103 mmol/L (ref 98–111)
Creatinine, Ser: 0.47 mg/dL (ref 0.44–1.00)
GFR, Estimated: >60 mL/min (ref >60)
Glucose, Bld: 213 mg/dL — ABNORMAL HIGH (ref 70–99)
Potassium: 4 mmol/L (ref 3.5–5.1)
Sodium: 134 mmol/L — ABNORMAL LOW (ref 135–145)

## 2023-11-04 MED ORDER — IPRATROPIUM-ALBUTEROL 0.5-2.5 (3) MG/3ML IN SOLN
3.0000 mL | Freq: Once | RESPIRATORY_TRACT | Status: AC
  Administered 2023-11-04: 3 mL via RESPIRATORY_TRACT
  Filled 2023-11-04: qty 3

## 2023-11-04 MED ORDER — INSULIN ASPART 100 UNIT/ML IJ SOLN
6.0000 [IU] | INTRAMUSCULAR | Status: DC
  Administered 2023-11-04 – 2023-11-05 (×6): 6 [IU] via SUBCUTANEOUS

## 2023-11-04 MED ORDER — ORAL CARE MOUTH RINSE: 15.0000 mL | OROMUCOSAL | Status: DC | PRN

## 2023-11-04 MED ORDER — INSULIN GLARGINE-YFGN 100 UNIT/ML ~~LOC~~ SOLN
25.0000 [IU] | Freq: Every day | SUBCUTANEOUS | Status: DC
  Administered 2023-11-05 – 2023-11-10 (×6): 25 [IU] via SUBCUTANEOUS
  Filled 2023-11-04 (×7): qty 0.25

## 2023-11-04 MED ORDER — ORAL CARE MOUTH RINSE
15.0000 mL | OROMUCOSAL | Status: DC
  Administered 2023-11-04 – 2023-11-11 (×26): 15 mL via OROMUCOSAL

## 2023-11-04 MED ORDER — ACETAMINOPHEN 160 MG/5ML PO SOLN
650.0000 mg | Freq: Four times a day (QID) | ORAL | Status: DC | PRN
  Administered 2023-11-04 – 2023-11-07 (×3): 650 mg
  Filled 2023-11-04 (×3): qty 20.3

## 2023-11-04 MED ORDER — STROKE: EARLY STAGES OF RECOVERY BOOK
Freq: Once | Status: AC
  Administered 2023-11-05: 1
  Filled 2023-11-04: qty 1

## 2023-11-04 NOTE — Progress Notes (Signed)
 NEUROLOGY PROGRESS NOTE   Date of service: Nov 04, 2023 Patient Name: Miranda Cochran MRN:  [id number] DOB:  [birth year] Chief Complaint: "seizure " Requesting Provider: Denson Flake, MD  Subjective  - Discontinued EEG on 5/22  - Patient continued to be more alert throughout yesterday per husband report  - Primary team considering extubation today     ROS  Unable to ascertain due to altered mental status post sedation and intubated   Current Facility-Administered Medications:    cefTRIAXone  (ROCEPHIN ) 2 g in sodium chloride  0.9 % 100 mL IVPB, 2 g, Intravenous, Q24H, Desai, Rahul P, PA-C, Stopped at 11/03/23 1458   Chlorhexidine  Gluconate Cloth 2 % PADS 6 each, 6 each, Topical, Daily, Mannam, Praveen, MD, 6 each at 11/04/23 0300   cyanocobalamin  (VITAMIN B12) tablet 500 mcg, 500 mcg, Per Tube, Daily, Mannam, Praveen, MD, 500 mcg at 11/03/23 0938   dexmedetomidine (PRECEDEX) 400 MCG/100ML (4 mcg/mL) infusion, 0-1.2 mcg/kg/hr, Intravenous, Titrated, Desai, Rahul P, PA-C, Last Rate: 5.99 mL/hr at 11/04/23 0700, 0.4 mcg/kg/hr at 11/04/23 0700   ezetimibe  (ZETIA ) tablet 10 mg, 10 mg, Per Tube, Daily, Mannam, Praveen, MD, 10 mg at 11/03/23 0938   feeding supplement (OSMOLITE 1.5 CAL) liquid 1,000 mL, 1,000 mL, Per Tube, Continuous, Mannam, Praveen, MD, Last Rate: 45 mL/hr at 11/04/23 0700, Infusion Verify at 11/04/23 0700   feeding supplement (PROSource TF20) liquid 60 mL, 60 mL, Per Tube, Daily, Mannam, Praveen, MD, 60 mL at 11/03/23 4098   fentaNYL  (SUBLIMAZE ) injection 50 mcg, 50 mcg, Intravenous, Q1H PRN, Paliwal, Aditya, MD, 50 mcg at 11/04/23 0247   fiber supplement (BANATROL TF) liquid 60 mL, 60 mL, Per Tube, BID, Oralee Billow I, RPH, 60 mL at 11/03/23 2148   hydrALAZINE  (APRESOLINE ) injection 10 mg, 10 mg, Intravenous, Q4H PRN, Akula, Vijaya, MD, 10 mg at 11/01/23 1230   insulin  aspart (novoLOG ) injection 0-20 Units, 0-20 Units, Subcutaneous, Q4H, Josiah Nigh, MD, 7 Units at  11/04/23 0735   insulin  aspart (novoLOG ) injection 4 Units, 4 Units, Subcutaneous, Q4H, Desai, Rahul P, PA-C, 4 Units at 11/04/23 0735   insulin  glargine-yfgn (SEMGLEE ) injection 22 Units, 22 Units, Subcutaneous, Daily, Desai, Rahul P, PA-C, 22 Units at 11/03/23 1191   levETIRAcetam  (KEPPRA ) tablet 1,000 mg, 1,000 mg, Per Tube, BID, Aras Albarran L, MD, 1,000 mg at 11/03/23 2148   multivitamin with minerals tablet 1 tablet, 1 tablet, Per Tube, Daily, Mannam, Praveen, MD, 1 tablet at 11/03/23 4782   Oral care mouth rinse, 15 mL, Mouth Rinse, Q2H, Josiah Nigh, MD, 15 mL at 11/04/23 9562   Oral care mouth rinse, 15 mL, Mouth Rinse, PRN, Josiah Nigh, MD   pantoprazole  (PROTONIX ) injection 40 mg, 40 mg, Intravenous, QHS, Josiah Nigh, MD, 40 mg at 11/03/23 2148   predniSONE  (DELTASONE ) tablet 10 mg, 10 mg, Per Tube, Q breakfast, Mannam, Praveen, MD, 10 mg at 11/04/23 0735   thiamine  (VITAMIN B1) tablet 100 mg, 100 mg, Per Tube, Daily, Mannam, Praveen, MD, 100 mg at 11/03/23 0938  Vitals   Vitals:   11/04/23 0400 11/04/23 0500 11/04/23 0600 11/04/23 0700  BP: (!) 103/52 (!) 112/54 (!) 117/56 (!) 94/50  Pulse: 85 95 96 91  Resp: (!) 24 (!) 28 (!) 26 (!) 24  Temp: 99.4 F (37.4 C)     TempSrc: Axillary     SpO2: 98% 100% 100% 100%  Weight:  60.4 kg    Height:        Body mass  index is 22.86 kg/m.  Physical Exam   Constitutional: Appears ill Eyes: No scleral injection.  HENT: Currently intubated Head: Large scalp hematoma, with dressing in place. EEG leeds in place.  Cardiovascular: Tachycardic  Neurologic Examination  She was sedated, intubated precedex held for 40  mins prior to exam Mental status continues to improve -- eyes open, follows some commands, with R hand squeeze and lets go, thumbs up, 2 fingers all on the right side; does not understand giving a high 5. Some left sided motor neglect Pupils were equal round reactive to light Right gaze preference but will  look towards the left  Blinks to threat bilaterally  Face appears grossly symmetric within limits of ETT  Moves right side to command, at least antigravity with the RUE, 2/5 RLE (flexes hip keeping heel on the bed). No movement left side to command, withdraws LUE, most likely triple flexion LLE  Labs/Imaging/Neurodiagnostic studies   Basic Metabolic Panel: Recent Labs  Lab 10/30/23 0425 10/31/23 0449 11/01/23 0545 11/02/23 0616 11/03/23 0529  NA 136 143 141 142 139  K 3.6 3.6 3.3* 3.7 3.6  CL 106 109 108 112* 108  CO2 22 23 23 25 23   GLUCOSE 134* 156* 269* 143* 166*  BUN 16 10 11 18 18   CREATININE 0.44 0.44 0.39* 0.41* 0.45  CALCIUM  6.9* 7.5* 7.5* 8.2* 7.9*  MG 2.1 2.2 2.0 2.1 2.0  PHOS 1.9* 1.8* 1.6* 4.7* 2.9    CBC: Recent Labs  Lab 10/30/23 0425 10/31/23 0449 11/01/23 0545 11/02/23 0616 11/03/23 0529  WBC 20.9* 13.7* 11.1* 9.9 7.4  HGB 9.5* 9.9* 9.6* 9.1* 8.9*  HCT 27.6* 30.7* 29.8* 29.3* 28.1*  MCV 95.8 101.7* 101.7* 102.1* 100.7*  PLT 190 179 161 172 148*     Lipid Panel:  Lab Results  Component Value Date   CHOL 195 02/25/2022   HDL 52.70 02/25/2022   LDLCALC 113 (H) 02/25/2022   LDLDIRECT 133.0 09/15/2021   TRIG 111 10/31/2023   CHOLHDL 4 02/25/2022    HgbA1c:  Lab Results  Component Value Date   HGBA1C 6.8 (H) 10/27/2023   Urine Drug Screen:  INR  Lab Results  Component Value Date   INR 1.2 10/27/2023   APTT  Lab Results  Component Value Date   APTT 22 (L) 10/27/2023   Phenobarbital  24.8 post load level on 5/17, 25.9 on 5/19  CT Head without contrast(Personally reviewed): 5/14 IMPRESSION: 1. Mixed subdural and subarachnoid hemorrhage of the right hemisphere, predominantly anterior. No midline shift or other mass effect. 2. Nondisplaced left occipital skull fracture with large left posterior scalp hematoma. 3. No acute fracture or static subluxation of the cervical spine.  5/15 1. Interval increase in size of an acute subdural  hematoma along the right cerebral convexity (greatest along the anterior right frontal lobe), now measuring up to 9 mm in thickness (previously 7 mm). 2. Acute subarachnoid hemorrhage scattered along the right cerebral convexity, increased. 3. Hemorrhagic parenchymal contusions within the anterior right temporal lobe and anteroinferior right frontal lobe have increased in size. 4. Trace acute subarachnoid hemorrhage along the anterolateral left temporal lobe, new from the prior exam. Possible small underlying left temporal lobe hemorrhagic parenchymal contusions. 5. A 2.3 x 2.6 cm hemorrhagic parenchymal contusion within the left cerebellar hemisphere has increased in size (previously 2.1 x 1.8 cm). 6. Thin subdural hemorrhage layering along the tentorium, bilaterally, similar to the prior exam. 7. Trace subarachnoid hemorrhage (versus prominent choroid plexus) within the inferior aspect of  the fourth ventricle. 8. Non-depressed fracture of the left occipital calvarium. 9. Left occipital scalp hematoma and laceration.  5/15 Follow-up/Time 1. An acute subdural hematoma along the right cerebral convexity has undergone some interval redistribution but has not significantly changed in overall extent. 2. Continued slight interval increase in extent of acute subarachnoid hemorrhage scattered along the right cerebral convexity. 3. Progressive edema at sites of hemorrhagic parenchymal contusions within the anteroinferior right frontal lobe and anterior right temporal lobe. 4. Small-volume subarachnoid hemorrhage scattered along the left cerebral convexity has mildly increased. Possible small underlying hemorrhagic parenchymal contusions within the anterolateral left temporal lobe, as before. 5. Thin acute subdural hemorrhage layering along the tentorium bilaterally, unchanged. 6. 2.3 x 2.6 cm acute parenchymal hemorrhage within the left cerebellar hemisphere, unchanged. 7. Trace  subarachnoid hemorrhage again questioned at the inferior aspect of the fourth ventricle. 8. No midline shift or hydrocephalus. 9. Known non-depressed fracture of the left occipital calvarium.  5/16 1. Extensive multifocal posttraumatic intracranial hemorrhage, stable since yesterday with EXCEPT FOR: Small 4 mm mixed but predominantly low-density left Subdural Hematoma has increased (coronal image 46). 2. No midline shift or significant intracranial mass effect despite the findings. No ventriculomegaly. Stable basilar cisterns. 3. Nondisplaced left posterior convexity skull fracture with large  5/17 1. Stable extensive multifocal posttraumatic intracranial hemorrhage since yesterday. No midline shift. Basilar cisterns remain patent. 2. No new intracranial abnormality. 3. Stable nondisplaced left posterior skull fracture. Scalp hematomas.    5/21 No progression of multifocal ICH as described. No mass effect or hydrocephalus.   Perforator infarct in the right corona radiata that has occurred since admission.  CT C-spine 5/14 No acute fracture or static subluxation of the cervical spine.  VAS US  lower extremity 5/19 - Findings consistent with acute deep vein thrombosis involving the right peroneal veins.     CT A PE: 5/19 Negative for pulmonary artery embolus    Neurodiagnostics  Continuous EEG  10/27/2023 to 10/28/2023 This study is suggestive of cortical dysfunction arising from right hemisphere likely secondary to underlying structural abnormality. Additionally there is moderate to severe diffuse encephalopathy likely related to sedation. No seizures or epileptiform discharges were seen throughout the recording 10/28/2023 to 10/29/2023 This study was initially suggestive of cortical dysfunction arising from right hemisphere likely secondary to underlying structural abnormality. Additionally there was moderate to severe diffuse encephalopathy likely related to sedation.  After  around 1746 on 10/28/2023, EEG worsened and showed seizures without clinical signs arising from right frontal region. Average 4 seizures were noted per hour, lasting about 1 minute each. 10/29/2023 1319 hrs. to 10/30/2023 0730 hrs Continuous slow, generalized and lateralized right hemisphere.  Study suggestive of cortical dysfunction or arising from right hemisphere likely secondary to underlying structural abnormality.  Additionally moderate to severe diffuse encephalopathy likely related to sedation.  No seizures seen. 10/30/2023 1319 to 10/31/2023 0700  - Continuous slow, generalized; This study was suggestive of moderate diffuse encephalopathy. No seizures were noted.  10/31/2023 1319 to 11/01/2023 0900  This study was suggestive of moderate diffuse encephalopathy. No seizures were noted.   11/01/2023 1319 to 11/02/2023 0930  This study was suggestive of moderate diffuse encephalopathy. No seizures were noted.   11/02/2023 1319 to 11/03/2023 0920  This study is suggestive of mild to moderate diffuse encephalopathy. No seizures were noted.   ASSESSMENT   SHANETTE TAMARGO is a 73 y.o. 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  Patient arrived on 5/14 via EMS for evaluation after a fall and hit her head.    Imaging concerning for occipital fracture with acute subarachnoid hemorrhage, multiple contusions and a right subdural hematoma. Most recent CT demonstrates extensive multifocal posttraumatic intracranial hemorrhage.   Repeat CT after admission with some worsening but that has stabilized on further imaging.  5/20 found to have right lower extremity DVT. Due to ICH and inability to anticoagulate IVC filter was placed via IR on 5/19. Appreciate neurosurgical guidance on when [facility] could be considered  Initially loaded with Keppra  due to electrographic seizures, Keppra  dose increased, continue to have  electrographic seizures for which phenobarb was added.  Upon increasing dose of Keppra  and addition of phenobarb on 5/17 - electrographically, the seizures have subsided. Phenobarbital  was discontinued on 5/19 to help minimize sedation effects. Now remains on only on renal appropriate dose of Keppra . 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   RECOMMENDATIONS  -- Low threshold to repeat head CT if acute or worsening neurological function -- Discontinued EEG on 5/22  -- MRI brain to fully define extent of injury (ordered) -- Continue Keppra  1000 twice daily (max for her renal function with CrCl 55), okay to transition to per tube -- Neurology will follow-up MRI brain, otherwise will sign off, please do not hesitate to reach out if further questions/concerns arise    Discussed with Dr. Waylan Haggard via secure chat  Brayton Calin, MD [facility] PGY-1   Attending Neurologist's note:  I personally saw this patient, gathering history, performing a full neurologic examination, reviewing relevant labs, personally reviewing relevant imaging including head CT, and formulated the assessment and plan, adding the note above for completeness and clarity to accurately reflect my thoughts  Baldwin Levee MD-PhD [facility] [phone]

## 2023-11-04 NOTE — Progress Notes (Signed)
 MRI brain today demonstrates stroke.  Etiology may be vessel injury in the setting of her TBI.  Will obtain CTA head and neck to rule out any dissections and assess for any critical stenoses Ordered Lipid panel, of note she is listed as having a statin intolerance due to myalgias and is on ezetimibe  A1c meeting goal < 7 at 6.8% Stroke order set utilized for quality metrics   ECHO  1. Left ventricular ejection fraction, by estimation, is 65 to 70%. The  left ventricle has normal function. The left ventricle has no regional  wall motion abnormalities. There is severe left ventricular hypertrophy of  the basal-septal segment. Left  ventricular diastolic parameters are indeterminate. Elevated left atrial  pressure.   2. Right ventricular systolic function is normal. The right ventricular  size is normal. There is normal pulmonary artery systolic pressure. The  estimated right ventricular systolic pressure is 28.1 mmHg.   3. The mitral valve is normal in structure. Trivial mitral valve  regurgitation. No evidence of mitral stenosis.   4. The aortic valve has an indeterminant number of cusps. There is  moderate calcification of the aortic valve. There is moderate thickening  of the aortic valve. Aortic valve regurgitation is not visualized.  Moderate aortic valve stenosis. Aortic valve  area, by VTI measures 1.10 cm. Aortic valve mean gradient measures 32.0  mmHg. Aortic valve Vmax measures 3.26 m/s.   5. The inferior vena cava is dilated in size with >50% respiratory  variability, suggesting right atrial pressure of 8 mmHg.   Lab Results  Component Value Date   HGBA1C 6.8 (H) 10/27/2023     No charge same day note

## 2023-11-04 NOTE — Progress Notes (Signed)
Pt transported to and from MRI on the ventilator without incident. 

## 2023-11-04 NOTE — Progress Notes (Signed)
 [name], female   DOB: 1950-12-10, 73 y.o.   MRN: [id number] Patient has been stable from neurosurgical standpoint she is slowly improving with following commands unilaterally.  She is not on any neurosurgical risk of needing any surgical intervention.  I will sign off at this time.

## 2023-11-04 NOTE — Plan of Care (Signed)
  Problem: Ischemic Stroke/TIA Tissue Perfusion: Goal: Complications of ischemic stroke/TIA will be minimized Outcome: Progressing   Problem: Health Behavior/Discharge Planning: Goal: Ability to manage health-related needs will improve Outcome: Progressing Goal: Goals will be collaboratively established with patient/family Outcome: Progressing   Problem: Nutrition: Goal: Risk of aspiration will decrease Outcome: Progressing Goal: Dietary intake will improve Outcome: Progressing

## 2023-11-04 NOTE — Procedures (Signed)
 Patient Name: Miranda Cochran  MRN: [id number]  Epilepsy Attending: Arleene Lack  Referring Physician/Provider: Josiah Nigh, MD  Duration: 5/221/2025 1319 to 11/03/2023 1730   Patient history: 73yo F with right occipital fracture with acute subarachnoid hemorrhage, multiple contusions and a right subdural hematoma. EEG to evaluate for seizure   Level of alertness: awake, asleep   AEDs during EEG study: LEV   Technical aspects: This EEG study was done with scalp electrodes positioned according to the 10-20 International system of electrode placement. Electrical activity was reviewed with band pass filter of 1-70Hz , sensitivity of 7 uV/mm, display speed of 15mm/sec with a 60Hz  notched filter applied as appropriate. EEG data were recorded continuously and digitally stored.  Video monitoring was available and reviewed as appropriate.   Description: The posterior dominant rhythm consists of 8 Hz activity of moderate voltage (25-35 uV) seen predominantly in posterior head regions, symmetric and reactive to eye opening and eye closing. Sleep was characterized by vertex waves, sleep spindles (12 to 14 Hz), maximal frontocentral region. EEG showed intermittent generalized 5-7hz  theta slowing admixed with intermittent 2-3hz  delta slowing. Hyperventilation and photic stimulation were not performed.      ABNORMALITY - Intermittent slow, generalized   IMPRESSION: This study is suggestive of mild to moderate diffuse encephalopathy. No seizures were noted.   Shaylinn Hladik O Ina Scrivens

## 2023-11-04 NOTE — Progress Notes (Signed)
 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  Has history of recurrent falls at home for unclear reason per husband at bedside.  Was being managed expectantly with NSGY following when had abrupt reduced LOC and tachyarrythmia.  On my arrival in [place] with pursed lip breathing.  Stat repeat head CT does show enlargement of multiple brain contusions.  NSGY aware and will see patient later today.  PCCM consulted to manage.  Trauma scan neg.  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   Significant Hospital Events: Including procedures, antibiotic start and stop dates in addition to other pertinent events   5/14 admit 5/15 AMS 5/16 Remains on minimal vent settings, hypoglycemic episode this AM, originally following commands overnight. Repeat CT Scan shows mild worsening of bleeding 5/17 Seizures on EEG.  Given additional dose of Keppra , Keppra  dose increased and loaded with phenobarb. CT head with stable extensive  multifocal posttraumatic ICH, no MLS, basilar cisterns remain patent, stable nondisplaced left posterior skull fx 5/18 EEG neg. Sedation off, tolerating SBT 8/5 but not waking up yet 5/19 LE duplex with R DVT, had IVC filter placed as AC not cleared by neurosurgery. CTA chest neg for PE but showed LLL PNA 5/22 On precedex, tolerating SBT, no further sz EEG 5/23 Extubated   Interim History / Subjective:   Extubated successfully   Objective    Blood pressure 134/61, pulse 89, temperature 99.8 F (37.7 C), temperature source Axillary, resp. rate (!) 24, height 5\' 4"  (1.626 m), weight 60.4 kg, SpO2 100%.    Vent Mode: PRVC FiO2 (%):  [40 %] 40 % Set Rate:  [24 bmp] 24 bmp Vt Set:  [430 mL] 430 mL PEEP:  [5 cmH20] 5 cmH20 Pressure Support:  [8 cmH20] 8 cmH20 Plateau Pressure:  [14 cmH20-18 cmH20] 14 cmH20   Intake/Output Summary (Last 24 hours) at 11/04/2023 0855 Last data filed at 11/04/2023 0800 Gross per 24 hour  Intake 1343.77 ml  Output 1925 ml  Net -581.23 ml   Filed Weights   11/02/23 0447 11/03/23 0500 11/04/23 0500  Weight: 59.6 kg 60.6 kg 60.4 kg   General:  critically ill-appearing F intubated off sedation HEENT: MM pink/moist, ETT in place sclera anicteric Neuro: awake and following commands in the R extremities CV: s1s2 tachycardic, no m/r/g PULM:  mildly diminished in the bilateral bases without rhonchi or wheezing, tolerated SBT  GI: soft, bsx4 active  Extremities: warm/dry, no edema, scattered ecchymosis     Resolved problem list  Hypoglycemia  Assessment and Plan   Acute encephalopathy, inability to protect airway- in context of worsening TBI, r/o seizure, no herniation on CT. TBI- good deal of traumatic subdural hematoma,  SAH, R temporal contusion, -LTM discontinued  -Repeat CT head if any acute or worsening neuro function -repeat MRI completed  -Continue Keppra  per neuro -continue serial neuro checks   Acute hypoxic respiratory failure due to  hospital-acquired pneumonia - sputum culture 5/17 with E.coli and Strep pneumoniae Extubated successfully  -Completes 7 day course of Unasyn  today -IS and mobilize as able    Autoimmune hepatitis/cirrhosis on chronic prednisone  -Continue PTA Prednisone   Hyperglycemia -SSI  RLE DVT - s/p IVC filter placement 5/19. Not cleared for anticoagulation by neurosurgery. CTA neg for PE. -Continue IVC filter. -likely poor candidate for DOAC given chronic falls   Hypokalemia - being repleted Hypophosphatemia - being repleted -Continue to monitor BMP and replete prn  Best Practice (right click and "Reselect all SmartList Selections" daily)   Diet/type: tubefeeds DVT prophylaxis SCD.  Pressure ulcer(s): N/A GI prophylaxis: PPI Lines: Central line Foley:  Yes, and it is still needed Code Status:  full code Last date of multidisciplinary goals of care discussion [updated husband 5/22  Critical care time: 35 min.    CRITICAL CARE Performed by: Patt Boozer Sandi Towe   Total critical care time: 35 minutes  Critical care time was exclusive of separately billable procedures and treating other patients.  Critical care was necessary to treat or prevent imminent or life-threatening deterioration.  Critical care was time spent personally by me on the following activities: development of treatment plan with patient and/or surrogate as well as nursing, discussions with consultants, evaluation of patient's response to treatment, examination of patient, obtaining history from patient or surrogate, ordering and performing treatments and interventions, ordering and review of laboratory studies, ordering and review of radiographic studies, pulse oximetry and re-evaluation of patient's condition.   Patt Boozer Tip Atienza, PA-C [facility] See Amion for pager If no response to pager , please call [phone] until 7pm After 7:00 pm call Elink  [phone]

## 2023-11-04 NOTE — Procedures (Signed)
 Extubation Procedure Note  Patient Details:   Name: Miranda Cochran DOB: 1950/07/25 MRN: [id number]   Airway Documentation:    Vent end date: 11/04/23 Vent end time: 1129   Evaluation  O2 sats: stable throughout Complications: No apparent complications Patient did tolerate procedure well. Bilateral Breath Sounds: Clear, Diminished   Pt extubated to 4 l/m  without difficulty.  Gari Junior 11/04/2023, 11:29 AM

## 2023-11-05 ENCOUNTER — Inpatient Hospital Stay (HOSPITAL_COMMUNITY)

## 2023-11-05 LAB — GLUCOSE, CAPILLARY
Glucose-Capillary: 167 mg/dL — ABNORMAL HIGH (ref 70–99)
Glucose-Capillary: 174 mg/dL — ABNORMAL HIGH (ref 70–99)
Glucose-Capillary: 179 mg/dL — ABNORMAL HIGH (ref 70–99)
Glucose-Capillary: 194 mg/dL — ABNORMAL HIGH (ref 70–99)
Glucose-Capillary: 238 mg/dL — ABNORMAL HIGH (ref 70–99)
Glucose-Capillary: 274 mg/dL — ABNORMAL HIGH (ref 70–99)

## 2023-11-05 LAB — MAGNESIUM: Magnesium: 2 mg/dL (ref 1.7–2.4)

## 2023-11-05 LAB — BASIC METABOLIC PANEL WITH GFR
Anion gap: 14 (ref 5–15)
BUN: 15 mg/dL (ref 8–23)
CO2: 20 mmol/L — ABNORMAL LOW (ref 22–32)
Calcium: 8.3 mg/dL — ABNORMAL LOW (ref 8.9–10.3)
Chloride: 100 mmol/L (ref 98–111)
Creatinine, Ser: 0.51 mg/dL (ref 0.44–1.00)
GFR, Estimated: >60 mL/min (ref >60)
Glucose, Bld: 173 mg/dL — ABNORMAL HIGH (ref 70–99)
Potassium: 4.1 mmol/L (ref 3.5–5.1)
Sodium: 134 mmol/L — ABNORMAL LOW (ref 135–145)

## 2023-11-05 LAB — LIPID PANEL
Cholesterol: 182 mg/dL (ref 0–200)
HDL: 39 mg/dL — ABNORMAL LOW (ref >40)
LDL Cholesterol: 119 mg/dL — ABNORMAL HIGH (ref 0–99)
Total CHOL/HDL Ratio: 4.7 ratio
Triglycerides: 119 mg/dL (ref <150)
VLDL: 24 mg/dL (ref 0–40)

## 2023-11-05 LAB — CBC
HCT: 36 % (ref 36.0–46.0)
Hemoglobin: 11.6 g/dL — ABNORMAL LOW (ref 12.0–15.0)
MCH: 31.8 pg (ref 26.0–34.0)
MCHC: 32.2 g/dL (ref 30.0–36.0)
MCV: 98.6 fL (ref 80.0–100.0)
Platelets: 225 10*3/uL (ref 150–400)
RBC: 3.65 MIL/uL — ABNORMAL LOW (ref 3.87–5.11)
RDW: 14.6 % (ref 11.5–15.5)
WBC: 15.6 10*3/uL — ABNORMAL HIGH (ref 4.0–10.5)
nRBC: 0 % (ref 0.0–0.2)

## 2023-11-05 LAB — PHOSPHORUS: Phosphorus: 3.3 mg/dL (ref 2.5–4.6)

## 2023-11-05 MED ORDER — INSULIN ASPART 100 UNIT/ML IJ SOLN
8.0000 [IU] | INTRAMUSCULAR | Status: DC
  Administered 2023-11-05 – 2023-11-11 (×32): 8 [IU] via SUBCUTANEOUS

## 2023-11-05 MED ORDER — CHLORHEXIDINE GLUCONATE CLOTH 2 % EX PADS
6.0000 | MEDICATED_PAD | Freq: Every day | CUTANEOUS | Status: DC
  Administered 2023-11-06 – 2023-11-11 (×4): 6 via TOPICAL

## 2023-11-05 MED ORDER — IOHEXOL 350 MG/ML SOLN
75.0000 mL | Freq: Once | INTRAVENOUS | Status: AC | PRN
  Administered 2023-11-05: 75 mL via INTRAVENOUS

## 2023-11-05 MED ORDER — ASPIRIN 81 MG PO CHEW
81.0000 mg | CHEWABLE_TABLET | Freq: Every day | ORAL | Status: DC
  Administered 2023-11-05: 81 mg via ORAL
  Filled 2023-11-05 (×2): qty 1

## 2023-11-05 MED ORDER — ASPIRIN 300 MG RE SUPP
300.0000 mg | Freq: Every day | RECTAL | Status: DC
  Administered 2023-11-06: 300 mg via RECTAL
  Filled 2023-11-05: qty 1

## 2023-11-05 NOTE — Evaluation (Signed)
 Physical Therapy Evaluation Patient Details Name: Miranda Cochran MRN: [id number] DOB: May 09, 1951 Today's Date: 11/05/2023  History of Present Illness  Pt is a 73 y.o. female presenting after fall 5/14. CT with occipital skull fracture, traumatic subarachnoid hemorrhage and right subdural hematoma. Repeat CT increased size of R subdural hematoma. Intubated 5/15-5/23 secondary to respiratory failure secondary to E. coli, Streptococcus HAP. EEG 5/16 with cortical dysfunction arising from R hemisphere, moderate-severe diffuse encephalopathy. EEG 5/17 with seizure activity averaging 4 seizures per hour lasting 1 min each; resolved with adjustment to medications. RLE DVT 5/19 and IVC filter placed. PMH significant for autoimmune hepatitis with cirrhosis of liver, HTN, depression, GERD, HLD, DM, IBS, osteoporosis, thyroid  nodule, vascular disease, vitamin D deficiency.   Clinical Impression  Pt in bed upon arrival of PT, agreeable to evaluation at this time. Prior to admission the pt was independent with all mobility, working full time, and living with her spouse in a home with level entry. The pt currently requires totalA to complete bed mobility, posterior support to maintain static sitting EOB, and was dependent on significant support to attempt sit-stand from EOB with assist to block bilateral knees and assist with hip extension. The pt demos limited activity tolerance and significant physical assistance to complete all mobility, will benefit from continued intensive therapies after d/c to facilitate return to maximal level of independence. Spouse present and supportive, educated on positioning, ROM, and cues for increased initiation of movement from pt he can complete outside of PT sessions.         If plan is discharge home, recommend the following: Two people to help with walking and/or transfers;Two people to help with bathing/dressing/bathroom;Assistance with feeding;Direct supervision/assist for  medications management;Direct supervision/assist for financial management;Assist for transportation;Help with stairs or ramp for entrance;Supervision due to cognitive status   Can travel by private vehicle        Equipment Recommendations Wheelchair (measurements PT);Wheelchair cushion (measurements PT);Hospital bed;Hoyer lift  Recommendations for Other Services  Rehab consult    Functional Status Assessment Patient has had a recent decline in their functional status and demonstrates the ability to make significant improvements in function in a reasonable and predictable amount of time.     Precautions / Restrictions Precautions Precautions: Fall Recall of Precautions/Restrictions: Impaired Precaution/Restrictions Comments: rectal pouch, cortrak Restrictions Weight Bearing Restrictions Per Provider Order: No      Mobility  Bed Mobility Overal bed mobility: Needs Assistance Bed Mobility: Supine to Sit     Supine to sit: Total assist, +2 for physical assistance, +2 for safety/equipment, HOB elevated     General bed mobility comments: poor initiation, sequencing, and problem solving needing step by step cues and assist for all portions.    Transfers Overall transfer level: Needs assistance Equipment used: 2 person hand held assist Transfers: Sit to/from Stand Sit to Stand: Total assist, +2 physical assistance, +2 safety/equipment           General transfer comment: bil LE block, trunk support, and external facilitation of hip extension; hunched posture throughout    Ambulation/Gait               General Gait Details: unable, pt dependent with static stance     Balance Overall balance assessment: Needs assistance Sitting-balance support: Single extremity supported, Bilateral upper extremity supported, No upper extremity supported, Feet unsupported   Sitting balance - Comments: mod A +   Standing balance support: Bilateral upper extremity supported, During  functional activity Standing balance-Leahy Scale: Zero  Standing balance comment: dependent on support in static stance                             Pertinent Vitals/Pain Pain Assessment Pain Assessment: Faces Faces Pain Scale: Hurts a little bit Pain Location: generalized Pain Descriptors / Indicators: Discomfort Pain Intervention(s): Limited activity within patient's tolerance, Monitored during session, Repositioned    Home Living Family/patient expects to be discharged to:: Private residence Living Arrangements: Spouse/significant other Available Help at Discharge: Family Type of Home: Apartment Home Access: Level entry       Home Layout: One level Home Equipment: Cane - quad      Prior Function Prior Level of Function : History of Falls (last six months);Independent/Modified Independent;Working/employed;Driving             Mobility Comments: independent, active ADLs Comments: works in [employer], independent     Extremity/Trunk Assessment   Upper Extremity Assessment Upper Extremity Assessment: Defer to OT evaluation RUE Deficits / Details: decr speed of motor planning, generally weak. Needing cues for repetitive motion of brushing teeth, reaches toward head/face multiple times but consistently undershooting and often not making contact with desired surface LUE Deficits / Details: Poor initiation. 2+/5 grip, 3+/5 elbow flexion, trace shoulder flexion, no intientional extension noted LUE Sensation: decreased light touch;decreased proprioception LUE Coordination: decreased gross motor;decreased fine motor    Lower Extremity Assessment Lower Extremity Assessment: RLE deficits/detail;LLE deficits/detail;Difficult to assess due to impaired cognition RLE Deficits / Details: pt with persistant kicking of RLE through session. does so to command and then continued through session. unable to sustain for MMT and not following commands for toes/ankle for MMT. unable to  communicate regardin sensation. no clonus noted LLE Deficits / Details: pt attempted to wiggle toes x3 to command, no attempt to move at ankle. did follow command to "kick both legs" with trace activation of left quad x3. not able to answer questions regarding sensation    Cervical / Trunk Assessment Cervical / Trunk Assessment: Kyphotic;Other exceptions Cervical / Trunk Exceptions: significant forward head posture as well at EOB, husband reports this is not baseline  Communication   Communication Communication: Impaired Factors Affecting Communication: Difficulty expressing self (needs commands repeated at times, inconsistent with yes/no nods)    Cognition Arousal: Alert Behavior During Therapy: Flat affect   PT - Cognitive impairments: Difficult to assess Difficult to assess due to: Impaired communication (pt not verbalizing and with limited yes/no nods)                     PT - Cognition Comments: pt following commands, limited on L extremities. does visually track but has generally limited communication as pt not verbalizing and not consistent with yes/no nods Following commands: Impaired Following commands impaired: Follows one step commands with increased time, Only follows one step commands consistently (see cognitive section)     Cueing Cueing Techniques: Verbal cues, Gestural cues, Tactile cues     General Comments General comments (skin integrity, edema, etc.): RR up to 40 max during mobility and at rest. Seems to be better with upright positioning maintains upper 20s to low 30s but even on arrival, RR up to 30. Rn reports this is baseline for today.    Exercises Low Level/ICU Exercises Ankle Circles/Pumps: PROM, Both, 10 reps Short Arc Quad: AAROM, Right, PROM, Left, 5 reps Other Exercises Other Exercises: discussed use of chair position in bed, ROM for hands and ankles,  and for spouse to cue pt to perform ankle pumps and grasp/finger extension with spouse, he  verbalized understanding   Assessment/Plan    PT Assessment Patient needs continued PT services  PT Problem List Decreased activity tolerance;Decreased balance;Decreased coordination;Decreased mobility;Decreased cognition;Decreased safety awareness       PT Treatment Interventions DME instruction;Gait training;Functional mobility training;Therapeutic activities;Therapeutic exercise;Balance training;Neuromuscular re-education;Cognitive remediation;Patient/family education    PT Goals (Current goals can be found in the Care Plan section)  Acute Rehab PT Goals Patient Stated Goal: return to independence PT Goal Formulation: With patient/family Time For Goal Achievement: 11/19/23 Potential to Achieve Goals: Fair    Frequency Min 2X/week     Co-evaluation PT/OT/SLP Co-Evaluation/Treatment: Yes Reason for Co-Treatment: Complexity of the patient's impairments (multi-system involvement);Necessary to address cognition/behavior during functional activity;For patient/therapist safety;To address functional/ADL transfers PT goals addressed during session: Mobility/safety with mobility;Balance;Strengthening/ROM         AM-PAC PT "6 Clicks" Mobility  Outcome Measure Help needed turning from your back to your side while in a flat bed without using bedrails?: Total Help needed moving from lying on your back to sitting on the side of a flat bed without using bedrails?: Total Help needed moving to and from a bed to a chair (including a wheelchair)?: Total Help needed standing up from a chair using your arms (e.g., wheelchair or bedside chair)?: Total Help needed to walk in hospital room?: Total Help needed climbing 3-5 steps with a railing? : Total 6 Click Score: 6    End of Session Equipment Utilized During Treatment: Gait belt;Oxygen Activity Tolerance: Patient limited by fatigue Patient left: in bed;with call bell/phone within reach;with bed alarm set;with family/visitor present Nurse  Communication: Mobility status PT Visit Diagnosis: Unsteadiness on feet (R26.81);Other abnormalities of gait and mobility (R26.89);Muscle weakness (generalized) (M62.81);History of falling (Z91.81);Pain;Hemiplegia and hemiparesis Hemiplegia - Right/Left: Left Hemiplegia - dominant/non-dominant: Non-dominant Hemiplegia - caused by:  (traumatic SDH)    Time: 5366-4403 PT Time Calculation (min) (ACUTE ONLY): 43 min   Charges:   PT Evaluation $PT Eval Moderate Complexity: 1 Mod   PT General Charges $$ ACUTE PT VISIT: 1 Visit         Barnabas Booth, PT, DPT   Acute Rehabilitation Department Office [phone] Secure Chat Communication Preferred  Lona Rist 11/05/2023, 2:49 PM

## 2023-11-05 NOTE — Evaluation (Signed)
 Occupational Therapy Evaluation Patient Details Name: Miranda Cochran MRN: [id number] DOB: 1951-04-19 Today's Date: 11/05/2023   History of Present Illness   Pt is a 73 y.o. female presenting after fall 5/14. CT with occipital skull fracture, traumatic subarachnoid hemorrhage and right subdural hematoma. Repeat CT increased size of R subdural hematoma. Intubated 5/15-5/23 secondary to respiratory failure secondary to E. coli, Streptococcus HAP. EEG 5/16 with cortical dysfunction arising from R hemisphere, moderate-severe diffuse encephalopathy. EEG 5/17 with seizure activity averaging 4 seizures per hour lasting 1 min each; resolved with adjustment to medications. RLE DVT 5/19 and IVC placed. PMH significant for autoimmune hepatitis with cirrhosis of liver, HTN, depression, GERD, HLD, DM, IBS, osteoporosis, thyroid  nodule, vascular disease, vitamin D deficiency.     Clinical Impressions PTA, pt lived with her husband and was mod I for ADL, IADL, working, and driving. Upon eval, pt with no verbalizations, L sided weakness >R, decreased balance, initiation, cognition, attention. Pt needing max-total A +2 for bed mobility, mod A for sitting balance, and total A for STS. Pt brushing teeth at EOB with max A with RUE this session. Facilitated weightbearing through LUE and RUE with lateral leans at EOB. Educated husband present during session regarding engagement on L and R sides and facilitation of meaningful movement of LUE as well as encouragement of command following. Due to significant change in functional status, good support and PLOF of independence, recommending intensive multidisciplinary rehabilitation >3 hours/day to optimize safety and independence in ADL.       If plan is discharge home, recommend the following:   Two people to help with walking and/or transfers;Two people to help with bathing/dressing/bathroom;Assistance with cooking/housework;Help with stairs or ramp for entrance;Assist for  transportation;Direct supervision/assist for medications management;Direct supervision/assist for financial management;Assistance with feeding     Functional Status Assessment   Patient has had a recent decline in their functional status and demonstrates the ability to make significant improvements in function in a reasonable and predictable amount of time.     Equipment Recommendations   Other (comment) (defer)     Recommendations for Other Services   Speech consult     Precautions/Restrictions   Precautions Precautions: Fall Recall of Precautions/Restrictions: Impaired Restrictions Weight Bearing Restrictions Per Provider Order: No     Mobility Bed Mobility Overal bed mobility: Needs Assistance Bed Mobility: Supine to Sit     Supine to sit: Total assist, +2 for physical assistance, +2 for safety/equipment, HOB elevated     General bed mobility comments: poor initiation, sequencing, and problem solving needing step by step cues and assist for all portions.    Transfers Overall transfer level: Needs assistance Equipment used: 2 person hand held assist Transfers: Sit to/from Stand Sit to Stand: Total assist, +2 physical assistance, +2 safety/equipment           General transfer comment: bil LE block, trunk support, and external facilitation of hip extension; hunched posture throughout      Balance Overall balance assessment: Needs assistance Sitting-balance support: Single extremity supported, Bilateral upper extremity supported, No upper extremity supported, Feet unsupported   Sitting balance - Comments: mod A +   Standing balance support: Bilateral upper extremity supported, During functional activity Standing balance-Leahy Scale: Zero                             ADL either performed or assessed with clinical judgement   ADL Overall ADL's : Needs assistance/impaired Eating/Feeding:  NPO   Grooming: Oral care;Maximal  assistance;Sitting Grooming Details (indicate cue type and reason): needing assist for normalized movement patterns of RUE for repetitive back and forth motion (able to initiate with increased time and cues but not sustain, for reducing undershooting/overshooting bringing toothbrush to mouth, and asisst for sitting balance. Upper Body Bathing: Maximal assistance;Sitting   Lower Body Bathing: Total assistance;Sit to/from stand   Upper Body Dressing : Maximal assistance;Sitting   Lower Body Dressing: Total assistance;Bed level;Sit to/from stand;Maximal assistance Lower Body Dressing Details (indicate cue type and reason): donned R sock with max A from bed level; total A for L Toilet Transfer: Total assistance;+2 for physical assistance;+2 for safety/equipment Toilet Transfer Details (indicate cue type and reason): for STS         Functional mobility during ADLs: Total assistance       Vision Ability to See in Adequate Light: 0 Adequate Patient Visual Report: Other (comment) (pt unable to report) Vision Assessment?: Wears glasses for reading;Vision impaired- to be further tested in functional context Additional Comments: tracks L and R from bed level, difficulty participating in formal visual assessment but able to mimick with RUE what therapist is doing with her hand at midline. Suspect visual deficits as result of occipital fx     Perception Perception: Impaired Preception Impairment Details: Inattention/Neglect (L side of body)     Praxis Praxis: Impaired Praxis Impairment Details: Initiation, Perseveration, Motor planning     Pertinent Vitals/Pain Pain Assessment Pain Assessment: Faces Faces Pain Scale: Hurts a little bit Pain Location: generalized Pain Descriptors / Indicators: Discomfort Pain Intervention(s): Limited activity within patient's tolerance, Monitored during session     Extremity/Trunk Assessment Upper Extremity Assessment Upper Extremity Assessment: Right  hand dominant;RUE deficits/detail;LUE deficits/detail RUE Deficits / Details: decr speed of motor planning, generally weak. Needing cues for repetitive motion of brushing teeth, reaches toward head/face multiple times but consistently undershooting and often not making contact with desired surface LUE Deficits / Details: Poor initiation. 2+/5 grip, 3+/5 elbow flexion, trace shoulder flexion, no intientional extension noted LUE Sensation: decreased light touch;decreased proprioception LUE Coordination: decreased gross motor;decreased fine motor   Lower Extremity Assessment Lower Extremity Assessment: Defer to PT evaluation   Cervical / Trunk Assessment Cervical / Trunk Assessment: Kyphotic (forward head posture as well at EOB, husband reports this is not baseline)   Communication Communication Communication: Impaired Factors Affecting Communication: Difficulty expressing self (repeated commands at times)   Cognition Arousal: Alert Behavior During Therapy: Flat affect Cognition: Cognition impaired   Orientation impairments:  (not verbalizing) Awareness: Intellectual awareness impaired, Online awareness impaired   Attention impairment (select first level of impairment): Focused attention, Sustained attention (sustained attention up to 30 secs) Executive functioning impairment (select all impairments): Initiation, Organization, Sequencing, Problem solving (decr initiation on L) OT - Cognition Comments: Pt not verbalizing this session. Follows basic commands with increased time ~90% of the time on R and ~30% of time on L. Pt does seem to perseverate on previously provided command with continued kicking of RLE out throughout session after initial testing and not doing so prior. pt attempts to follow commands on R even when commanded to do so on L. fairly aware of objects on L and can follow commands on L with incresaed time within physical limitations, but decr awareness of OT touching arm, and  decr initiation                 Following commands: Impaired Following commands impaired: Follows one step commands  with increased time, Only follows one step commands consistently (see cognitive section)     Cueing  General Comments      RR up to 40 max during mobility and at rest. Seems to be better with upright positioning maintains upper 20s to low 30s but even on arrival, RR up to 30. Rn reports this is baseline for today.   Exercises     Shoulder Instructions      Home Living Family/patient expects to be discharged to:: Private residence Living Arrangements: Spouse/significant other Available Help at Discharge: Family Type of Home: Apartment Home Access: Level entry     Home Layout: One level     Bathroom Shower/Tub: Chief Strategy Officer: Standard     Home Equipment: Cane - quad          Prior Functioning/Environment Prior Level of Function : History of Falls (last six months);Independent/Modified Independent;Working/employed;Driving               ADLs Comments: works in Teacher, early years/pre Problem List: Decreased strength;Impaired balance (sitting and/or standing);Decreased range of motion;Decreased activity tolerance;Impaired vision/perception;Decreased coordination;Decreased cognition;Decreased safety awareness;Decreased knowledge of use of DME or AE;Decreased knowledge of precautions;Cardiopulmonary status limiting activity;Impaired UE functional use   OT Treatment/Interventions: Self-care/ADL training;Therapeutic exercise;DME and/or AE instruction;Therapeutic activities;Patient/family education;Balance training      OT Goals(Current goals can be found in the care plan section)   Acute Rehab OT Goals Patient Stated Goal: pt did not state OT Goal Formulation: With patient Time For Goal Achievement: 11/19/23 Potential to Achieve Goals: Good   OT Frequency:  Min 2X/week    Co-evaluation              AM-PAC OT "6 Clicks"  Daily Activity     Outcome Measure Help from another person eating meals?: Total Help from another person taking care of personal grooming?: A Lot Help from another person toileting, which includes using toliet, bedpan, or urinal?: Total Help from another person bathing (including washing, rinsing, drying)?: Total Help from another person to put on and taking off regular upper body clothing?: A Lot Help from another person to put on and taking off regular lower body clothing?: Total 6 Click Score: 8   End of Session Nurse Communication: Mobility status  Activity Tolerance: Patient tolerated treatment well Patient left: in bed;with call bell/phone within reach;with bed alarm set;with family/visitor present  OT Visit Diagnosis: Unsteadiness on feet (R26.81);Muscle weakness (generalized) (M62.81);Repeated falls (R29.6);Other abnormalities of gait and mobility (R26.89);History of falling (Z91.81);Low vision, both eyes (H54.2);Feeding difficulties (R63.3);Other symptoms and signs involving cognitive function;Cognitive communication deficit (R41.841);Hemiplegia and hemiparesis Symptoms and signs involving cognitive functions: Nontraumatic SAH (SDH) Hemiplegia - Right/Left: Left Hemiplegia - dominant/non-dominant: Non-Dominant Hemiplegia - caused by:  (traumatic SAH, SDH)                Time: 1610-9604 OT Time Calculation (min): 44 min Charges:  OT General Charges $OT Visit: 1 Visit OT Evaluation $OT Eval Moderate Complexity: 1 Mod OT Treatments $Self Care/Home Management : 8-22 mins  Karilyn Ouch, OTR/L [facility] Acute Rehabilitation Office: [phone]   Emery Hans 11/05/2023, 2:15 PM

## 2023-11-05 NOTE — Progress Notes (Signed)
 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  Has history of recurrent falls at home for unclear reason per husband at bedside.  Was being managed expectantly with NSGY following when had abrupt reduced LOC and tachyarrythmia.  On my arrival in SVT with pursed lip breathing.  Stat repeat head CT does show enlargement of multiple brain contusions.  NSGY aware and will see patient later today.  PCCM consulted to manage.  Trauma scan neg.  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   Significant Hospital Events: Including procedures, antibiotic start and stop dates in addition to other pertinent events   5/14 admit 5/15 AMS 5/16 Remains on minimal vent settings, hypoglycemic episode this AM, originally following commands overnight. Repeat CT Scan shows mild worsening of bleeding 5/17 Seizures on EEG.  Given additional dose of Keppra , Keppra  dose increased and loaded with phenobarb. CT head with stable extensive  multifocal posttraumatic ICH, no MLS, basilar cisterns remain patent, stable nondisplaced left posterior skull fx 5/18 EEG neg. Sedation off, tolerating SBT 8/5 but not waking up yet 5/19 LE duplex with R DVT, had IVC filter placed as AC not cleared by neurosurgery. CTA chest neg for PE but showed LLL PNA 5/22 On precedex, tolerating SBT, no further sz EEG 5/23 Extubated  5/24 Weak cough with tachypnea, at risk for inability to protect airway   Interim History / Subjective:  Alert and able to follow some commands but remains nonverbal  Objective    Blood pressure (!) 121/58, pulse (!) 109, temperature (!) 101.5 F (38.6 C), temperature source Axillary, resp. rate (!) 39, height 5\' 4"  (1.626 m), weight 53.8 kg, SpO2 96%.    Vent Mode: PSV;CPAP FiO2 (%):  [36 %-40 %] 36 % Pressure Support:  [5 cmH20] 5 cmH20   Intake/Output Summary (Last 24 hours) at 11/05/2023 1000 Last data filed at 11/05/2023 0900 Gross per 24 hour  Intake 1135.68 ml  Output 1100 ml  Net 35.68 ml   Filed Weights   11/03/23 0500 11/04/23 0500 11/05/23 0500  Weight: 60.6 kg 60.4 kg 53.8 kg   Physical Exam General: Acute on chronic ill-appearing deconditioned thin elderly female lying in bed in no acute distress HEENT: [place]/AT, MM pink/moist, PERRL,  Neuro: Alert and able to follow intermittent commands but weak globally CV: s1s2 regular rate and rhythm, no murmur, rubs, or gallops,  PULM: Tachypnea with bilateral rhonchi, weak cough, gag present but weak as well GI: soft, bowel sounds active in all 4 quadrants, non-tender, non-distended, tolerating TF Extremities: warm/dry, no edema  Skin: no rashes or lesions  Resolved problem list  Hypoglycemia  Assessment and Plan   Acute encephalopathy -In context of  worsening TBI, r/o seizure, no herniation on CT. TBI -Good deal of traumatic subdural hematoma, SAH, R temporal contusion - MRI brain with small acute infarct in the right more than left cerebral white  matter with extensive TBI.  CTA head and neck negative P: Neuroprotective measures Continue Keppra  per neuro Frequent neurochecks Minimize sedation  Acute hypoxic respiratory failure due to hospital-acquired pneumonia - sputum culture 5/17 with E.coli and Strep pneumoniae -Able to be successfully extubated 5/24 but remains high risk for respiratory decompensation given poor cough and tachypnea -S/p 7 days of IV Unasyn  P: Continue close monitoring in the ICU for respiratory decompensation Aspiration precautions NTS suctioning as needed Minimize sedation  Autoimmune hepatitis/cirrhosis on chronic prednisone  P: Continue home prednisone   Hyperglycemia P: SSI CBG goal 140-180 CBG checks every 4  RLE DVT - s/p IVC filter placement 5/19. Not cleared for anticoagulation by neurosurgery. CTA neg for PE. P: IVC filter remains in place Risk of anticoagulation outweighs benefit given chronic falls  Hypokalemia  Hypophosphatemia P: Supplement as needed  Best Practice (right click and "Reselect all SmartList Selections" daily)   Diet/type: tubefeeds DVT prophylaxis SCD.  Pressure ulcer(s): N/A GI prophylaxis: PPI Lines: Central line Foley:  Yes, and it is still needed Code Status:  full code Last date of multidisciplinary goals of care discussion [updated husband 5/22  Critical care time:  NA   Donique Hammonds D. Harris, NP-C [facility] Personal contact information can be found on Amion  If no contact or response made please call [phone] 11/05/2023, 10:02 AM

## 2023-11-05 NOTE — Progress Notes (Cosign Needed)
 STROKE TEAM PROGRESS NOTE    SIGNIFICANT HOSPITAL EVENTS Ms. Miranda Cochran is a 73 y.o. 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 Patient arrived on 5/14 via EMS for evaluation after a fall and hit her head.   INTERIM HISTORY/SUBJECTIVE No acute events overnight. Discussed with patient's family member at bedside.    OBJECTIVE  CBC    Component Value Date/Time   WBC 15.6 (H) 11/05/2023 0603   RBC 3.65 (L) 11/05/2023 0603   HGB 11.6 (L) 11/05/2023 0603   HCT 36.0 11/05/2023 0603   PLT 225 11/05/2023 0603   MCV 98.6 11/05/2023 0603   MCV 102.3 (A) 09/11/2014 1020   MCH 31.8 11/05/2023 0603   MCHC 32.2 11/05/2023 0603   RDW 14.6 11/05/2023 0603   LYMPHSABS 2.2 02/25/2022 1135   MONOABS 0.6 02/25/2022 1135   EOSABS 0.2 02/25/2022 1135   BASOSABS 0.0 02/25/2022 1135    BMET    Component Value Date/Time   NA 134 (L) 11/05/2023 0603   NA 137 05/14/2011 0000   K 4.1 11/05/2023 0603   CL 100 11/05/2023 0603   CO2 20 (L) 11/05/2023 0603   GLUCOSE 173 (H) 11/05/2023 0603   GLUCOSE 131 (H) 05/27/2006 1701   BUN 15 11/05/2023 0603   BUN 12 05/14/2011 0000   CREATININE 0.51 11/05/2023 0603   CREATININE 0.65 11/01/2015 0959   CALCIUM  8.3 (L) 11/05/2023 0603   GFRNONAA >60 11/05/2023 0603   GFRNONAA >89 11/01/2015 0959    IMAGING past 24 hours CT ANGIO HEAD NECK W WO CM Result Date: 11/05/2023 CLINICAL DATA:  Stroke workup EXAM: CT ANGIOGRAPHY HEAD AND NECK WITH AND WITHOUT CONTRAST TECHNIQUE: Multidetector CT imaging of the head and neck was performed using the standard protocol during bolus administration of intravenous contrast. Multiplanar CT image reconstructions and MIPs were obtained to evaluate the vascular anatomy. Carotid stenosis measurements (when applicable) are obtained utilizing NASCET criteria, using the distal internal carotid diameter as the denominator. RADIATION DOSE  REDUCTION: This exam was performed according to the departmental dose-optimization program which includes automated exposure control, adjustment of the mA and/or kV according to patient size and/or use of iterative reconstruction technique. CONTRAST:  75mL OMNIPAQUE  IOHEXOL  350 MG/ML SOLN COMPARISON:  Brain MRI from yesterday and head CT from 3 days ago FINDINGS: CT HEAD FINDINGS Brain: Hemorrhagic contusions in subdural collection show no interval increase. No evidence of progressive white matter infarction best seen by recent MRI. No hydrocephalus or shift Vascular: No hyperdense vessel or unexpected calcification. Skull: Left posterior scalp swelling with linear fracture in the posterior left calvarium that is nondepressed. Sinuses/Orbits: Mild mastoid and sinus opacification in the setting of nasal intubation Review of the MIP images confirms the above findings CTA NECK FINDINGS Aortic arch: Atheromatous plaque with 3 vessel branching Right carotid system: Moderate mixed density plaque centered at the bifurcation without ulceration or significant stenosis. Left carotid system: Moderate mixed density plaque at the proximal left ICA without ulceration or flow reducing stenosis. Vertebral arteries: No proximal subclavian stenosis. Atheromatous plaque at both vertebral origins with right V1 stenosis approaching 50%. No dissection, beading, or ulceration. Skeleton: Generalized cervical spine degeneration.  No acute finding Other neck: No acute finding Upper chest: Clear apical lungs Review of the MIP images confirms the above findings CTA HEAD FINDINGS Anterior circulation: Mild atheromatous calcification on the cavernous carotids. Generalized medium vessel undulation attributed to atherosclerosis, no correctable in flow reducing  proximal stenosis. No branch occlusion, beading, or aneurysm Posterior circulation: Left dominant vertebral artery. Vertebral and basilar arteries are sufficiently patent with moderate  calcified plaque at the left V4 segment. Fetal type right PCA flow. No branch occlusion, beading, or aneurysm. Venous sinuses: Unremarkable for arterial timing Anatomic variants: As above Review of the MIP images confirms the above findings IMPRESSION: No emergent finding. Cervical and intracranial atherosclerosis with nearly 50% stenosis at the right vertebral origin. Electronically Signed   By: Ronnette Coke M.D.   On: 11/05/2023 05:21    Vitals:   11/05/23 1000 11/05/23 1100 11/05/23 1200 11/05/23 1300  BP: (!) 118/58 125/83 134/67 106/80  Pulse: (!) 109 (!) 107 (!) 112   Resp: (!) 42 (!) 26 17 (!) 41  Temp:   99.2 F (37.3 C)   TempSrc:   Oral   SpO2: 99% 100% 98%   Weight:      Height:         PHYSICAL EXAM General:  Alert, well-nourished, well-developed patient in no acute distress Psych:  Mood and affect appropriate for situation CV: Regular rate and rhythm on monitor Respiratory:  Regular, unlabored respirations on room air GI: Abdomen soft and nontender   NEURO:  Patient is awake and alert.  Bilateral blink to threat, does track examiner.  Global aphasia present with no verbal responses.  She does follow very simple commands on her right hand.  No movement on left side to command.  She does withdrawal left arm slightly.Not cooperative with tongue protrusion, shoulder shrug, coordination.   Most Recent NIH: 21   ASSESSMENT/PLAN  Ms. Miranda Cochran is a 73 y.o. 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 Patient arrived on 5/14 via EMS for evaluation after a fall and hit her head.   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 No midline shift or other mass effect Nondisplaced left occipital skull fracture with large left posterior scalp hematoma CT head 5/1 5: Interval increase in size of acute subdural hematoma along right cerebral convexity, now measuring up to 9 mm in thickness (previously 7 mm) Acute subarachnoid hemorrhage scattered along right cerebral convexity, increased Hemorrhagic parenchymal contusions within anterior right temporal lobe and anterior O inferior right frontal lobe have increased in size Trace acute subarachnoid hemorrhage along anterolateral left temporal lobe, new from prior exam.   2.3 x 2.6 cm hemorrhagic parenchymal contusion within left cerebellar hemisphere has increased in size.   CT Head 5/15pm: Acute subdural hematoma along the right cerebral convexity has undergone interval restructure Bhushan but has not significantly changed in overall extent Continued slight interval increase in extent of acute subarachnoid hemorrhage scattered along right cerebral convexity Progressive edema at sites of hemorrhagic parenchymal contusions within right frontal and right temporal Small volume subarachnoid hemorrhage scattered along the left cerebral convexity has mildly increased Thin acute subdural hemorrhage layering along the tentorium bilaterally unchanged 2.3 x 2.6 cm acute parenchymal hemorrhage within left cerebellar hemisphere, unchanged CT Head 5/16: Extensive multifocal posttraumatic intracranial hemorrhage, stable since yesterday with exception for small 4 mm mixed but predominantly low-density left subdural hematoma has increased No midline shift or significant intracranial mass effect despite above findings. CT Head 5/17: Stable extensive multifocal posttraumatic intracranial hemorrhage.  No  midline shift.  Basilar cisterns remain patent. No new intracranial abnormality Stable nondisplaced left posterior skull fracture with scalp hematomas. CT Head 5/21:  No progression  of multifocal ICH.  No mass effect or hydrocephalus Perforator infarction right corona radiata that is occurred since admission. CTA head & neck: No LVO   MRI   Small acute infarcts, right >left cerebral white matter  2D Echo: EF 65-70%, Severe LVH VAS US  LE DVT: Acute DVT right peroneal vein IVC filter in place (5/19) LDL 119 HgbA1c 6.8 VTE prophylaxis - SCDs aspirin  325 mg daily prior to admission, now on ASA 81mg , ok to continue since it has been over 1 week since recent ICH  Patient should also get ziopatch on discharge given b/l strokes  Therapy recommendations:  CIR Disposition:  pending  Traumatic Subdural Hemorrhage Post-traumatic IPH, multifocal R temporal contusion Repeat CT after admission with some worsening but that has stabilized on further imaging  On ASA 81mg  only, no anticoagulation due to hemorrhages  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 Additionally there is moderate to severe diffuse encephalopathy likely related to sedation. No seizures or epileptiform discharges were seen throughout the recording 10/28/2023 to 10/29/2023: initially suggestive of cortical dysfunction arising from right hemisphere likely secondary to underlying structural abnormality. Additionally there was moderate to severe diffuse encephalopathy likely related to sedation.  After around 1746 on 10/28/2023, EEG worsened and showed seizures without clinical signs arising from right frontal region. Average 4 seizures were noted per hour, lasting about 1 minute each. 10/29/2023 1319 hrs. to 10/30/2023 0730 hrs: Continuous slow, generalized and lateralized right hemisphere.  Study suggestive of cortical dysfunction or arising from right hemisphere likely  secondary to underlying structural abnormality.  Additionally moderate to severe diffuse encephalopathy likely related to sedation.  No seizures seen. 10/30/2023 1319 to 10/31/2023 0700  Continuous slow, generalized; This study was suggestive of moderate diffuse encephalopathy. No seizures were noted. 10/31/2023 1319 to 11/01/2023 0900: suggestive of moderate diffuse encephalopathy. No seizures were noted.  11/01/2023 1319 to 11/02/2023 0930: suggestive of moderate diffuse encephalopathy. No seizures were noted.  11/02/2023 1319 to 11/03/2023 0920: suggestive of mild to moderate diffuse encephalopathy. No seizures were noted.   Acute respiratory failure  Sputum culture 5/17 positive for E. coli and strep pneumoniae Extubated 5/24 Completed 7 days of IV Unasyn   Hypertension Home meds: Lisinopril  10 mg Stable SBP goal: Aim for gradual normotension Avoid hypotension  Hyperlipidemia Home meds: Zetia  10mg  Statin intolerance noted on chart review LDL 119, goal < 70 Continue Zetia  at discharge Recommend close follow-up with PCP  Diabetes type II Controlled Home meds: Insulin  lispro HgbA1c 6.8, goal < 7.0 CBGs SSI Recommend close follow-up with PCP for better DM control  Dysphagia Patient has post-stroke dysphagia, SLP consulted    Diet   Diet NPO time specified   Advance diet as tolerated   Other Active Problems Autoimmune hepatitis/cirrhosis Home prednisone  continued  Hospital day # 9   Pt seen by Neuro NP/APP with MD. Note/plan to be edited by MD as needed.    Audrene Lease, DNP, AGACNP-BC [facility] Please use AMION for contact information & EPIC for messaging.  I have evaluated patient and edited the NP's note to reflect my thoughts.   Rosezella Conte, MD [facility]  To contact Stroke Continuity provider, please refer to [email or web address]. After hours, contact General Neurology

## 2023-11-05 NOTE — Progress Notes (Signed)

## 2023-11-06 DIAGNOSIS — Z515 Encounter for palliative care: Secondary | ICD-10-CM | POA: Diagnosis not present

## 2023-11-06 DIAGNOSIS — S065XAA Traumatic subdural hemorrhage with loss of consciousness status unknown, initial encounter: Secondary | ICD-10-CM | POA: Diagnosis not present

## 2023-11-06 LAB — GLUCOSE, CAPILLARY
Glucose-Capillary: 194 mg/dL — ABNORMAL HIGH (ref 70–99)
Glucose-Capillary: 166 mg/dL — ABNORMAL HIGH (ref 70–99)
Glucose-Capillary: 204 mg/dL — ABNORMAL HIGH (ref 70–99)
Glucose-Capillary: 192 mg/dL — ABNORMAL HIGH (ref 70–99)
Glucose-Capillary: 113 mg/dL — ABNORMAL HIGH (ref 70–99)

## 2023-11-06 LAB — BASIC METABOLIC PANEL WITH GFR
Anion gap: 9 (ref 5–15)
BUN: 19 mg/dL (ref 8–23)
CO2: 21 mmol/L — ABNORMAL LOW (ref 22–32)
Calcium: 8.3 mg/dL — ABNORMAL LOW (ref 8.9–10.3)
Chloride: 105 mmol/L (ref 98–111)
Creatinine, Ser: 0.54 mg/dL (ref 0.44–1.00)
GFR, Estimated: >60 mL/min (ref >60)
Glucose, Bld: 237 mg/dL — ABNORMAL HIGH (ref 70–99)
Potassium: 3.4 mmol/L — ABNORMAL LOW (ref 3.5–5.1)
Sodium: 135 mmol/L (ref 135–145)

## 2023-11-06 LAB — CBC
HCT: 32 % — ABNORMAL LOW (ref 36.0–46.0)
Hemoglobin: 10.2 g/dL — ABNORMAL LOW (ref 12.0–15.0)
MCH: 31.7 pg (ref 26.0–34.0)
MCHC: 31.9 g/dL (ref 30.0–36.0)
MCV: 99.4 fL (ref 80.0–100.0)
Platelets: 231 10*3/uL (ref 150–400)
RBC: 3.22 MIL/uL — ABNORMAL LOW (ref 3.87–5.11)
RDW: 14.8 % (ref 11.5–15.5)
WBC: 12.3 10*3/uL — ABNORMAL HIGH (ref 4.0–10.5)
nRBC: 0 % (ref 0.0–0.2)

## 2023-11-06 LAB — MAGNESIUM: Magnesium: 2 mg/dL (ref 1.7–2.4)

## 2023-11-06 LAB — PHOSPHORUS: Phosphorus: 3.1 mg/dL (ref 2.5–4.6)

## 2023-11-06 MED ORDER — ASPIRIN 81 MG PO CHEW
81.0000 mg | CHEWABLE_TABLET | Freq: Every day | ORAL | Status: DC
  Administered 2023-11-07 – 2023-11-11 (×5): 81 mg
  Filled 2023-11-06 (×5): qty 1

## 2023-11-06 MED ORDER — ASPIRIN 300 MG RE SUPP: 300.0000 mg | Freq: Every day | RECTAL | Status: DC

## 2023-11-06 MED ORDER — POTASSIUM CHLORIDE 20 MEQ PO PACK
40.0000 meq | PACK | Freq: Once | ORAL | Status: AC
  Administered 2023-11-06: 40 meq
  Filled 2023-11-06: qty 2

## 2023-11-06 NOTE — Plan of Care (Signed)
 Problem: Education: Goal: Knowledge of the prescribed therapeutic regimen 11/06/2023 1800 by Trenton Frock, RN Outcome: Progressing 11/06/2023 1757 by Trenton Frock, RN Outcome: Progressing Goal: Knowledge of disease or condition will improve 11/06/2023 1800 by Trenton Frock, RN Outcome: Progressing 11/06/2023 1757 by Trenton Frock, RN Outcome: Progressing   Problem: Clinical Measurements: Goal: Neurologic status will improve 11/06/2023 1800 by Trenton Frock, RN Outcome: Progressing 11/06/2023 1757 by Trenton Frock, RN Outcome: Progressing   Problem: Tissue Perfusion: Goal: Ability to maintain intracranial pressure will improve 11/06/2023 1800 by Trenton Frock, RN Outcome: Progressing 11/06/2023 1757 by Trenton Frock, RN Outcome: Progressing   Problem: Respiratory: Goal: Will regain and/or maintain adequate ventilation 11/06/2023 1800 by Trenton Frock, RN Outcome: Progressing 11/06/2023 1757 by Trenton Frock, RN Outcome: Progressing   Problem: Skin Integrity: Goal: Risk for impaired skin integrity will decrease 11/06/2023 1800 by Trenton Frock, RN Outcome: Progressing 11/06/2023 1757 by Trenton Frock, RN Outcome: Progressing Goal: Demonstration of wound healing without infection will improve 11/06/2023 1800 by Trenton Frock, RN Outcome: Progressing 11/06/2023 1757 by Trenton Frock, RN Outcome: Progressing   Problem: Psychosocial: Goal: Ability to verbalize positive feelings about self will improve 11/06/2023 1800 by Trenton Frock, RN Outcome: Progressing 11/06/2023 1757 by Trenton Frock, RN Outcome: Progressing Goal: Ability to participate in self-care as condition permits will improve 11/06/2023 1800 by Trenton Frock, RN Outcome: Progressing 11/06/2023 1757 by Trenton Frock, RN Outcome: Progressing Goal: Ability to identify appropriate support needs will  improve 11/06/2023 1800 by Trenton Frock, RN Outcome: Progressing 11/06/2023 1757 by Trenton Frock, RN Outcome: Progressing   Problem: Health Behavior/Discharge Planning: Goal: Ability to manage health-related needs will improve 11/06/2023 1800 by Trenton Frock, RN Outcome: Progressing 11/06/2023 1757 by Trenton Frock, RN Outcome: Progressing   Problem: Nutritional: Goal: Risk of aspiration will decrease 11/06/2023 1800 by Trenton Frock, RN Outcome: Progressing 11/06/2023 1757 by Trenton Frock, RN Outcome: Progressing Goal: Dietary intake will improve 11/06/2023 1800 by Trenton Frock, RN Outcome: Progressing 11/06/2023 1757 by Trenton Frock, RN Outcome: Progressing   Problem: Communication: Goal: Ability to communicate needs accurately will improve 11/06/2023 1800 by Trenton Frock, RN Outcome: Progressing 11/06/2023 1757 by Trenton Frock, RN Outcome: Progressing   Problem: Education: Goal: Knowledge of disease or condition will improve 11/06/2023 1800 by Trenton Frock, RN Outcome: Progressing 11/06/2023 1757 by Trenton Frock, RN Outcome: Progressing Goal: Understanding of discharge needs will improve 11/06/2023 1800 by Trenton Frock, RN Outcome: Progressing 11/06/2023 1757 by Trenton Frock, RN Outcome: Progressing   Problem: Health Behavior/Discharge Planning: Goal: Ability to identify changes in lifestyle to reduce recurrence of condition will improve 11/06/2023 1800 by Trenton Frock, RN Outcome: Progressing 11/06/2023 1757 by Trenton Frock, RN Outcome: Progressing Goal: Identification of resources available to assist in meeting health care needs will improve 11/06/2023 1800 by Trenton Frock, RN Outcome: Progressing 11/06/2023 1757 by Trenton Frock, RN Outcome: Progressing   Problem: Physical Regulation: Goal: Complications related to the disease process, condition or  treatment will be avoided or minimized 11/06/2023 1800 by Trenton Frock, RN Outcome: Progressing 11/06/2023 1757 by Trenton Frock, RN Outcome: Progressing   Problem: Safety: Goal: Ability to remain free from injury will improve 11/06/2023 1800 by Trenton Frock, RN Outcome: Progressing 11/06/2023 1757 by Trenton Frock, RN Outcome: Progressing   Problem: Education:  Goal: Understanding of CV disease, CV risk reduction, and recovery process will improve 11/06/2023 1800 by Trenton Frock, RN Outcome: Progressing 11/06/2023 1757 by Trenton Frock, RN Outcome: Progressing Goal: Individualized Educational Video(s) 11/06/2023 1800 by Trenton Frock, RN Outcome: Progressing 11/06/2023 1757 by Trenton Frock, RN Outcome: Progressing   Problem: Activity: Goal: Ability to return to baseline activity level will improve 11/06/2023 1800 by Trenton Frock, RN Outcome: Progressing 11/06/2023 1757 by Trenton Frock, RN Outcome: Progressing   Problem: Cardiovascular: Goal: Ability to achieve and maintain adequate cardiovascular perfusion will improve 11/06/2023 1800 by Trenton Frock, RN Outcome: Progressing 11/06/2023 1757 by Trenton Frock, RN Outcome: Progressing Goal: Vascular access site(s) Level 0-1 will be maintained 11/06/2023 1800 by Trenton Frock, RN Outcome: Progressing 11/06/2023 1757 by Trenton Frock, RN Outcome: Progressing   Problem: Health Behavior/Discharge Planning: Goal: Ability to safely manage health-related needs after discharge will improve 11/06/2023 1800 by Trenton Frock, RN Outcome: Progressing 11/06/2023 1757 by Trenton Frock, RN Outcome: Progressing   Problem: Education: Goal: Knowledge of disease or condition will improve 11/06/2023 1800 by Trenton Frock, RN Outcome: Progressing 11/06/2023 1757 by Trenton Frock, RN Outcome: Progressing Goal: Knowledge of secondary  prevention will improve (MUST DOCUMENT ALL) 11/06/2023 1800 by Trenton Frock, RN Outcome: Progressing 11/06/2023 1757 by Trenton Frock, RN Outcome: Progressing Goal: Knowledge of patient specific risk factors will improve (DELETE if not current risk factor) 11/06/2023 1800 by Trenton Frock, RN Outcome: Progressing 11/06/2023 1757 by Trenton Frock, RN Outcome: Progressing   Problem: Ischemic Stroke/TIA Tissue Perfusion: Goal: Complications of ischemic stroke/TIA will be minimized 11/06/2023 1800 by Trenton Frock, RN Outcome: Progressing 11/06/2023 1757 by Trenton Frock, RN Outcome: Progressing   Problem: Coping: Goal: Will verbalize positive feelings about self 11/06/2023 1800 by Trenton Frock, RN Outcome: Progressing 11/06/2023 1757 by Trenton Frock, RN Outcome: Progressing Goal: Will identify appropriate support needs 11/06/2023 1800 by Trenton Frock, RN Outcome: Progressing 11/06/2023 1757 by Trenton Frock, RN Outcome: Progressing   Problem: Health Behavior/Discharge Planning: Goal: Ability to manage health-related needs will improve 11/06/2023 1800 by Trenton Frock, RN Outcome: Progressing 11/06/2023 1757 by Trenton Frock, RN Outcome: Progressing Goal: Goals will be collaboratively established with patient/family 11/06/2023 1800 by Trenton Frock, RN Outcome: Progressing 11/06/2023 1757 by Trenton Frock, RN Outcome: Progressing   Problem: Self-Care: Goal: Ability to participate in self-care as condition permits will improve 11/06/2023 1800 by Trenton Frock, RN Outcome: Progressing 11/06/2023 1757 by Trenton Frock, RN Outcome: Progressing Goal: Verbalization of feelings and concerns over difficulty with self-care will improve 11/06/2023 1800 by Trenton Frock, RN Outcome: Progressing 11/06/2023 1757 by Trenton Frock, RN Outcome: Progressing Goal: Ability to communicate needs  accurately will improve 11/06/2023 1800 by Trenton Frock, RN Outcome: Progressing 11/06/2023 1757 by Trenton Frock, RN Outcome: Progressing   Problem: Nutrition: Goal: Risk of aspiration will decrease 11/06/2023 1800 by Trenton Frock, RN Outcome: Progressing 11/06/2023 1757 by Trenton Frock, RN Outcome: Progressing Goal: Dietary intake will improve 11/06/2023 1800 by Trenton Frock, RN Outcome: Progressing 11/06/2023 1757 by Trenton Frock, RN Outcome: Progressing

## 2023-11-06 NOTE — Progress Notes (Signed)
 Daily Progress Note   Patient Name: REGGIE WELGE       Date: 11/06/2023 DOB: 1951/06/13  Age: 73 y.o. MRN#: [id number] Attending Physician: Aleck Hurdle, MD Primary Care Physician: Donley Furth, MD Admit Date: 10/26/2023  Reason for Consultation/Follow-up: Establishing goals of care   Length of Stay: 10  Current Medications: Scheduled Meds:   aspirin   81 mg Per Tube Daily   Or   aspirin   300 mg Rectal Daily   Chlorhexidine  Gluconate Cloth  6 each Topical Daily   cyanocobalamin   500 mcg Per Tube Daily   ezetimibe   10 mg Per Tube Daily   feeding supplement (PROSource TF20)  60 mL Per Tube Daily   fiber supplement (BANATROL TF)  60 mL Per Tube BID   insulin  aspart  0-20 Units Subcutaneous Q4H   insulin  aspart  8 Units Subcutaneous Q4H   insulin  glargine-yfgn  25 Units Subcutaneous Daily   levETIRAcetam   1,000 mg Per Tube BID   multivitamin with minerals  1 tablet Per Tube Daily   mouth rinse  15 mL Mouth Rinse 4 times per day   pantoprazole  (PROTONIX ) IV  40 mg Intravenous QHS   predniSONE   10 mg Per Tube Q breakfast   thiamine   100 mg Per Tube Daily    Continuous Infusions:  feeding supplement (OSMOLITE 1.5 CAL) 45 mL/hr at 11/06/23 1200    PRN Meds: acetaminophen  (TYLENOL ) oral liquid 160 mg/5 mL, hydrALAZINE , mouth rinse  Physical Exam Vitals reviewed.  Constitutional:      General: She is not in acute distress.    Interventions: Nasal cannula in place.     Comments: cortrak  Cardiovascular:     Rate and Rhythm: Tachycardia present.  Pulmonary:     Effort: Pulmonary effort is normal.  Skin:    General: Skin is dry.  Neurological:     Mental Status: She is alert.     Comments: Not responding verbally but able to follow commands  Psychiatric:         Behavior: Behavior normal.             Vital Signs: BP (!) 146/78   Pulse (!) 113   Temp 99.9 F (37.7 C) (Axillary)   Resp (!) 40   Ht 5\' 4"  (1.626 m)   Wt  53.8 kg   SpO2 93%   BMI 20.36 kg/m  SpO2: SpO2: 93 % O2 Device: O2 Device: Nasal Cannula O2 Flow Rate: O2 Flow Rate (L/min): 6 L/min     Patient Active Problem List   Diagnosis Date Noted   Localization-related (focal) (partial) idiopathic epilepsy and epileptic syndromes with seizures of localized onset, not intractable, with status epilepticus (HCC) 10/31/2023   Protein-calorie malnutrition, severe 10/28/2023   Subdural hematoma (HCC) 10/27/2023   Hypokalemia 10/27/2023   Hypertensive urgency 10/27/2023   Acute CVA (cerebrovascular accident) (HCC) 10/27/2023   Subdural bleeding (HCC) 10/27/2023   TBI (traumatic brain injury) (HCC) 10/27/2023   Osteoporosis 08/29/2023   Uncontrolled diabetes mellitus with hyperglycemia, with long-term current use of insulin  (HCC) 02/25/2022   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   Cellulitis of right lower leg 04/07/2021   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 08/29/2020   Alcohol use 08/29/2020   Family history of colon cancer in father dx 41 and died 52 w/ second cancer 09-06-19   Insomnia 06/11/2019   Asthma 06/11/2019   Venous (peripheral) insufficiency 04/12/2018   Long term (current) use of systemic steroids 11/02/2017   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 female   with past medical history of autoimmune hepatitis with cirrhosis of liver, hypertension, depression, GERD, hyperlipidemia admitted on 10/26/2023 after falling and hitting her head.   In ED CT showed subdural hematoma, subarachnoid hemorrhage, and occipital skull fracture. She is intubated.    Note from neurology 5/16: Most recent CT demonstrates extensive multifocal posttraumatic intracranial hemorrhage,stable since yesterday with except for: Small 4 mm mixed but predominantly low-density left Subdural Hematoma has increased.  Today's Discussion: Reviewed chart. Patient to be moved from ICU today. Patient lying in bed. She is alert and following commands. She is unable to verbally respond. Her husband Ines Mane is at bedside. Patient worked with PT and OT yesterday. Chet is hopeful that once she is out of the ICU she will be able to work more with therapies. He is encouraged by her continued small improvements.  Emotional support provided.  Encouraged family to call PMT with questions or concerns. PMT will continue to support.  Recommendations/Plan: Full code Full scope Time for outcomes Continued PMT support   Code Status:    Code Status Orders  (From admission, onward)           Start     Ordered   10/27/23 0330  Full code  Continuous       Question:  By:  Answer:  Consent: discussion documented in EHR   10/27/23 0330         Extensive chart review has been completed prior to seeing the patient including labs, vital signs, imaging, progress/consult notes, orders, medications, and available advance directive documents.    Care plan was discussed with bedside RN  Time spent: 25 minutes  Thank you for allowing the Palliative Medicine Team to assist in the care of this patient.  Daina Drum, NP  Please contact Palliative Medicine Team phone at [phone] for questions and concerns.

## 2023-11-06 NOTE — Progress Notes (Signed)
 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  Has history of recurrent falls at home for unclear reason per husband at bedside.  Was being managed expectantly with NSGY following when had abrupt reduced LOC and tachyarrythmia.  On my arrival in SVT with pursed lip breathing.  Stat repeat head CT does show enlargement of multiple brain contusions.  NSGY aware and will see patient later today.  PCCM consulted to manage.  Trauma scan neg.  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   Significant Hospital Events: Including procedures, antibiotic start and stop dates in addition to other pertinent events   5/14 admit 5/15 AMS 5/16 Remains on minimal vent settings, hypoglycemic episode this AM, originally following commands overnight. Repeat CT Scan shows mild worsening of bleeding 5/17 Seizures on EEG.  Given additional dose of Keppra , Keppra  dose increased and loaded with phenobarb. CT head with stable extensive  multifocal posttraumatic ICH, no MLS, basilar cisterns remain patent, stable nondisplaced left posterior skull fx 5/18 EEG neg. Sedation off, tolerating SBT 8/5 but not waking up yet 5/19 LE duplex with R DVT, had IVC filter placed as AC not cleared by neurosurgery. CTA chest neg for PE but showed LLL PNA 5/22 On precedex, tolerating SBT, no further sz EEG 5/23 Extubated  5/24 Weak cough with tachypnea, at risk for inability to protect airway   Interim History / Subjective:  More alert today. Consistently following commands  Objective    Blood pressure 125/65, pulse (!) 113, temperature 100 F (37.8 C), temperature source Axillary, resp. rate (!) 32, height 5\' 4"  (1.626 m), weight 53.8 kg, SpO2 96%.        Intake/Output Summary (Last 24 hours) at 11/06/2023 0945 Last data filed at 11/06/2023 0800 Gross per 24 hour  Intake 915 ml  Output 1000 ml  Net -85 ml   Filed Weights   11/03/23 0500 11/04/23 0500 11/05/23 0500  Weight: 60.6 kg 60.4 kg 53.8 kg   Physical Exam Chronically and acutely ill appearing Left sided hemiparesis Awake, tracks, follows commands more consistently Coughs on command, weak but improved from yesterday.  Bilateral rhonchi Tachycardic, regular   Resolved problem list  Hypoglycemia  Assessment and Plan   Acute encephalopathy -In context of worsening TBI, r/o seizure, no herniation on CT. TBI -Good deal of traumatic subdural hematoma, SAH, R temporal contusion - MRI brain with small acute infarct in the right more than left cerebral white matter with extensive TBI.  CTA head and neck negative P: Neuroprotective measures Continue Keppra  per neuro Frequent neurochecks Minimize sedation  Acute hypoxic respiratory failure due to hospital-acquired pneumonia - sputum culture 5/17 with E.coli and Strep pneumoniae -Able to  be successfully extubated 5/24 but remains high risk for respiratory decompensation given poor cough and tachypnea -S/p 7 days of IV  Unasyn  P: Continue close monitoring in the ICU for respiratory decompensation Aspiration precautions NTS suctioning as needed Minimize sedation  Autoimmune hepatitis/cirrhosis on chronic prednisone  P: Continue home prednisone   Hyperglycemia P: SSI CBG goal 140-180 CBG checks every 4  RLE DVT - s/p IVC filter placement 5/19. Not cleared for anticoagulation by neurosurgery. CTA neg for PE. P: IVC filter remains in place Risk of anticoagulation outweighs benefit given chronic falls  Hypokalemia  Hypophosphatemia P: Supplement as needed  Best Practice (right click and "Reselect all SmartList Selections" daily)   Diet/type: tubefeeds DVT prophylaxis SCD.  Pressure ulcer(s): N/A GI prophylaxis: PPI Lines: Central line Foley:  Yes, and it is still needed Code Status:  full code Last date of multidisciplinary goals of care discussion [updated husband 5/25 at bedside.  I think ok to send out of the ICU to neuro progressive unit. Will sign out to TRH to assume care 5/26.   I spent 50 minutes in total visit time for this patient, with more than 50% spent counseling/coordinating care.  Louie Rover, MD Pulmonary and Critical Care Medicine [facility] 11/06/2023 9:48 AM Pager: see AMION  If no response to pager, please call critical care on call (see AMION) until 7pm After 7:00 pm call Elink

## 2023-11-06 NOTE — Progress Notes (Signed)
 STROKE TEAM PROGRESS NOTE    SIGNIFICANT HOSPITAL EVENTS Ms. Miranda Cochran is a 73 y.o. 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 Patient arrived on 5/14 via EMS for evaluation after a fall and hit her head.   INTERIM HISTORY/SUBJECTIVE  Neurologically improved this morning. She is following commands on the right side and does attempt to squeeze with the left hand.   OBJECTIVE  CBC    Component Value Date/Time   WBC 12.3 (H) 11/06/2023 0506   RBC 3.22 (L) 11/06/2023 0506   HGB 10.2 (L) 11/06/2023 0506   HCT 32.0 (L) 11/06/2023 0506   PLT 231 11/06/2023 0506   MCV 99.4 11/06/2023 0506   MCV 102.3 (A) 09/11/2014 1020   MCH 31.7 11/06/2023 0506   MCHC 31.9 11/06/2023 0506   RDW 14.8 11/06/2023 0506   LYMPHSABS 2.2 02/25/2022 1135   MONOABS 0.6 02/25/2022 1135   EOSABS 0.2 02/25/2022 1135   BASOSABS 0.0 02/25/2022 1135    BMET    Component Value Date/Time   NA 135 11/06/2023 0506   NA 137 05/14/2011 0000   K 3.4 (L) 11/06/2023 0506   CL 105 11/06/2023 0506   CO2 21 (L) 11/06/2023 0506   GLUCOSE 237 (H) 11/06/2023 0506   GLUCOSE 131 (H) 05/27/2006 1701   BUN 19 11/06/2023 0506   BUN 12 05/14/2011 0000   CREATININE 0.54 11/06/2023 0506   CREATININE 0.65 11/01/2015 0959   CALCIUM  8.3 (L) 11/06/2023 0506   GFRNONAA >60 11/06/2023 0506   GFRNONAA >89 11/01/2015 0959    IMAGING past 24 hours No results found.   Vitals:   11/06/23 0900 11/06/23 1000 11/06/23 1100 11/06/23 1200  BP: (!) 115/46 126/67 (!) 126/58 (!) 146/78  Pulse: (!) 108 (!) 112 (!) 112 (!) 113  Resp: (!) 30 (!) 29 (!) 36 (!) 40  Temp:      TempSrc:      SpO2: 96% 96% 93% 93%  Weight:      Height:         PHYSICAL EXAM General:  Alert, well-nourished, well-developed patient in no acute distress Psych:  Mood and affect appropriate for situation CV: Regular rate and rhythm on monitor Respiratory:   Regular, unlabored respirations on room air GI: Abdomen soft and nontender   NEURO:  Patient is awake and alert.  Bilateral blink to threat, does track examiner.  Global aphasia present with some grunting sounds/attempts to speak.   She does follow very simple commands on her right hand and does attempt to squeeze with her left hand. .No movement LLE.  Not cooperative with tongue protrusion, shoulder shrug, coordination.   Most Recent NIH: 21   ASSESSMENT/PLAN  Ms. Miranda Cochran is a 73 y.o. 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 Patient arrived on 5/14 via EMS for evaluation after a fall and hit her head.   Traumatic Subdural Hemorrhage Post-traumatic IPH, multifocal R temporal contusion  Small acute infarcts, right >left cerebral white matter Etiology: possible vessel injury secondary to traumatic injuries  CT Head 5/14: Mixed subdural and subarachnoid hemorrhage right hemisphere, predominantly anterior. No midline shift or other mass effect Nondisplaced left occipital skull fracture with large left posterior scalp hematoma CT head 5/1 5: Interval increase in size of acute subdural hematoma along right cerebral convexity, now measuring up to 9 mm in thickness (previously 7 mm)  Acute subarachnoid hemorrhage scattered along right cerebral convexity, increased Hemorrhagic parenchymal contusions within anterior right temporal lobe and anterior O inferior right frontal lobe have increased in size Trace acute subarachnoid hemorrhage along anterolateral left temporal lobe, new from prior exam.   2.3 x 2.6 cm hemorrhagic parenchymal contusion within left cerebellar hemisphere has increased in size.   CT Head 5/15pm: Acute subdural hematoma along the right cerebral convexity has undergone interval restructure Bhushan but has not significantly changed in overall extent Continued slight  interval increase in extent of acute subarachnoid hemorrhage scattered along right cerebral convexity Progressive edema at sites of hemorrhagic parenchymal contusions within right frontal and right temporal Small volume subarachnoid hemorrhage scattered along the left cerebral convexity has mildly increased Thin acute subdural hemorrhage layering along the tentorium bilaterally unchanged 2.3 x 2.6 cm acute parenchymal hemorrhage within left cerebellar hemisphere, unchanged CT Head 5/16: Extensive multifocal posttraumatic intracranial hemorrhage, stable since yesterday with exception for small 4 mm mixed but predominantly low-density left subdural hematoma has increased No midline shift or significant intracranial mass effect despite above findings. CT Head 5/17: Stable extensive multifocal posttraumatic intracranial hemorrhage.  No midline shift.  Basilar cisterns remain patent. No new intracranial abnormality Stable nondisplaced left posterior skull fracture with scalp hematomas. CT Head 5/21:  No progression of multifocal ICH.  No mass effect or hydrocephalus Perforator infarction right corona radiata that is occurred since admission. CTA head & neck: No LVO   MRI   Small acute infarcts, right >left cerebral white matter  2D Echo: EF 65-70%, Severe LVH VAS US  LE DVT: Acute DVT right peroneal vein IVC filter in place (5/19) LDL 119 HgbA1c 6.8 VTE prophylaxis - SCDs aspirin  325 mg daily prior to admission, now on ASA 81mg , ok to continue since it has been over 1 week since recent ICH  Patient should also get ziopatch on discharge given b/l strokes  Therapy recommendations:  CIR Disposition:  pending  Traumatic Subdural Hemorrhage Post-traumatic IPH, multifocal R temporal contusion Repeat CT after admission with some worsening but that has stabilized on further imaging  On ASA 81mg  only, no anticoagulation due to hemorrhages  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 Additionally there is moderate to severe diffuse encephalopathy likely related to sedation. No seizures or epileptiform discharges were seen throughout the recording 10/28/2023 to 10/29/2023: initially suggestive of cortical dysfunction arising from right hemisphere likely secondary to underlying structural abnormality. Additionally there was moderate to severe diffuse encephalopathy likely related to sedation.  After around 1746 on 10/28/2023, EEG worsened and showed seizures without clinical signs arising from right frontal region. Average 4 seizures were noted per hour, lasting about 1 minute each. 10/29/2023 1319 hrs. to 10/30/2023 0730 hrs: Continuous slow, generalized and lateralized right hemisphere.  Study suggestive of cortical dysfunction or arising from right hemisphere likely secondary to underlying structural abnormality.  Additionally moderate to severe diffuse encephalopathy likely related to sedation.  No seizures seen. 10/30/2023 1319 to 10/31/2023 0700  Continuous slow, generalized; This study was suggestive of moderate diffuse encephalopathy. No seizures were noted. 10/31/2023 1319 to 11/01/2023 0900: suggestive of moderate diffuse encephalopathy. No seizures were noted.  11/01/2023 1319 to 11/02/2023 0930: suggestive of moderate diffuse encephalopathy. No seizures were noted.  11/02/2023 1319 to 11/03/2023 0920: suggestive of mild to  moderate diffuse encephalopathy. No seizures were noted.   Acute respiratory failure  Sputum culture 5/17 positive for E. coli and strep pneumoniae Extubated 5/24 Completed 7 days of IV Unasyn   Hypertension Home meds: Lisinopril  10 mg Stable SBP goal: Aim for gradual normotension Avoid  hypotension  Hyperlipidemia Home meds: Zetia  10mg  Statin intolerance noted on chart review LDL 119, goal < 70 Continue Zetia  at discharge Recommend close follow-up with PCP  Diabetes type II Controlled Home meds: Insulin  lispro HgbA1c 6.8, goal < 7.0 CBGs SSI Recommend close follow-up with PCP for better DM control  Dysphagia Patient has post-stroke dysphagia, SLP consulted    Diet   Diet NPO time specified   Advance diet as tolerated   Other Active Problems Autoimmune hepatitis/cirrhosis Home prednisone  continued  Hospital day # 10  Neurology will sign off with recommendations as above. Please recall with any further questions or concerns.  Thank you for this consult.   Pt seen by Neuro NP/APP with MD. Note/plan to be edited by MD as needed.    Audrene Lease, DNP, AGACNP-BC [facility] Please use AMION for contact information & EPIC for messaging.

## 2023-11-06 NOTE — Plan of Care (Signed)
  Problem: Education: Goal: Knowledge of the prescribed therapeutic regimen Outcome: Progressing Goal: Knowledge of disease or condition will improve Outcome: Progressing   Problem: Clinical Measurements: Goal: Neurologic status will improve Outcome: Progressing   Problem: Tissue Perfusion: Goal: Ability to maintain intracranial pressure will improve Outcome: Progressing   Problem: Respiratory: Goal: Will regain and/or maintain adequate ventilation Outcome: Progressing   Problem: Skin Integrity: Goal: Risk for impaired skin integrity will decrease Outcome: Progressing Goal: Demonstration of wound healing without infection will improve Outcome: Progressing   Problem: Psychosocial: Goal: Ability to verbalize positive feelings about self will improve Outcome: Progressing Goal: Ability to participate in self-care as condition permits will improve Outcome: Progressing Goal: Ability to identify appropriate support needs will improve Outcome: Progressing   Problem: Health Behavior/Discharge Planning: Goal: Ability to manage health-related needs will improve Outcome: Progressing   Problem: Nutritional: Goal: Risk of aspiration will decrease Outcome: Progressing Goal: Dietary intake will improve Outcome: Progressing   Problem: Communication: Goal: Ability to communicate needs accurately will improve Outcome: Progressing   Problem: Education: Goal: Knowledge of disease or condition will improve Outcome: Progressing Goal: Understanding of discharge needs will improve Outcome: Progressing   Problem: Health Behavior/Discharge Planning: Goal: Ability to identify changes in lifestyle to reduce recurrence of condition will improve Outcome: Progressing Goal: Identification of resources available to assist in meeting health care needs will improve Outcome: Progressing   Problem: Physical Regulation: Goal: Complications related to the disease process, condition or treatment  will be avoided or minimized Outcome: Progressing   Problem: Safety: Goal: Ability to remain free from injury will improve Outcome: Progressing   Problem: Education: Goal: Understanding of CV disease, CV risk reduction, and recovery process will improve Outcome: Progressing Goal: Individualized Educational Video(s) Outcome: Progressing   Problem: Activity: Goal: Ability to return to baseline activity level will improve Outcome: Progressing   Problem: Cardiovascular: Goal: Ability to achieve and maintain adequate cardiovascular perfusion will improve Outcome: Progressing Goal: Vascular access site(s) Level 0-1 will be maintained Outcome: Progressing   Problem: Health Behavior/Discharge Planning: Goal: Ability to safely manage health-related needs after discharge will improve Outcome: Progressing   Problem: Education: Goal: Knowledge of disease or condition will improve Outcome: Progressing Goal: Knowledge of secondary prevention will improve (MUST DOCUMENT ALL) Outcome: Progressing Goal: Knowledge of patient specific risk factors will improve (DELETE if not current risk factor) Outcome: Progressing   Problem: Ischemic Stroke/TIA Tissue Perfusion: Goal: Complications of ischemic stroke/TIA will be minimized Outcome: Progressing   Problem: Coping: Goal: Will verbalize positive feelings about self Outcome: Progressing Goal: Will identify appropriate support needs Outcome: Progressing   Problem: Health Behavior/Discharge Planning: Goal: Ability to manage health-related needs will improve Outcome: Progressing Goal: Goals will be collaboratively established with patient/family Outcome: Progressing   Problem: Self-Care: Goal: Ability to participate in self-care as condition permits will improve Outcome: Progressing Goal: Verbalization of feelings and concerns over difficulty with self-care will improve Outcome: Progressing Goal: Ability to communicate needs accurately  will improve Outcome: Progressing   Problem: Nutrition: Goal: Risk of aspiration will decrease Outcome: Progressing Goal: Dietary intake will improve Outcome: Progressing

## 2023-11-06 NOTE — Progress Notes (Signed)
 [facility] ADULT ICU REPLACEMENT PROTOCOL FOR AM LAB REPLACEMENT ONLY  The patient does apply for the [facility] Adult ICU Electrolyte Replacment Protocol based on the criteria listed below:   1. Is GFR >/= 40 ml/min? Yes.    Patient's GFR today is >60 2. Is urine output >/= 0.5 ml/kg/hr for the last 6 hours? Yes.   Patient's UOP is 0.5 ml/kg/hr 3. Is BUN < 60 mg/dL? Yes.    Patient's BUN today is 19 4. Abnormal electrolyte(s): K 3.4 5. Ordered repletion with: KCl 40 mEq per tube  Enrigue Harvard, PharmD, BCPS Please see amion for complete clinical pharmacist phone list  11/06/2023 7:57 AM

## 2023-11-07 ENCOUNTER — Inpatient Hospital Stay (HOSPITAL_COMMUNITY)

## 2023-11-07 DIAGNOSIS — R9431 Abnormal electrocardiogram [ECG] [EKG]: Secondary | ICD-10-CM

## 2023-11-07 DIAGNOSIS — Z515 Encounter for palliative care: Secondary | ICD-10-CM | POA: Diagnosis not present

## 2023-11-07 DIAGNOSIS — S065XAA Traumatic subdural hemorrhage with loss of consciousness status unknown, initial encounter: Secondary | ICD-10-CM | POA: Diagnosis not present

## 2023-11-07 DIAGNOSIS — Z7189 Other specified counseling: Secondary | ICD-10-CM | POA: Diagnosis not present

## 2023-11-07 DIAGNOSIS — I350 Nonrheumatic aortic (valve) stenosis: Secondary | ICD-10-CM

## 2023-11-07 LAB — CBC WITH DIFFERENTIAL/PLATELET
Abs Immature Granulocytes: 0.09 10*3/uL — ABNORMAL HIGH (ref 0.00–0.07)
Basophils Absolute: 0.1 10*3/uL (ref 0.0–0.1)
Basophils Relative: 1 %
Eosinophils Absolute: 0.3 10*3/uL (ref 0.0–0.5)
Eosinophils Relative: 3 %
HCT: 32.9 % — ABNORMAL LOW (ref 36.0–46.0)
Hemoglobin: 10.5 g/dL — ABNORMAL LOW (ref 12.0–15.0)
Immature Granulocytes: 1 %
Lymphocytes Relative: 18 %
Lymphs Abs: 1.6 10*3/uL (ref 0.7–4.0)
MCH: 32.4 pg (ref 26.0–34.0)
MCHC: 31.9 g/dL (ref 30.0–36.0)
MCV: 101.5 fL — ABNORMAL HIGH (ref 80.0–100.0)
Monocytes Absolute: 0.7 10*3/uL (ref 0.1–1.0)
Monocytes Relative: 7 %
Neutro Abs: 6.5 10*3/uL (ref 1.7–7.7)
Neutrophils Relative %: 70 %
Platelets: 226 10*3/uL (ref 150–400)
RBC: 3.24 MIL/uL — ABNORMAL LOW (ref 3.87–5.11)
RDW: 14.9 % (ref 11.5–15.5)
WBC: 9.2 10*3/uL (ref 4.0–10.5)
nRBC: 0 % (ref 0.0–0.2)

## 2023-11-07 LAB — BASIC METABOLIC PANEL WITH GFR
Anion gap: 14 (ref 5–15)
BUN: 20 mg/dL (ref 8–23)
CO2: 19 mmol/L — ABNORMAL LOW (ref 22–32)
Calcium: 8.7 mg/dL — ABNORMAL LOW (ref 8.9–10.3)
Chloride: 103 mmol/L (ref 98–111)
Creatinine, Ser: 0.45 mg/dL (ref 0.44–1.00)
GFR, Estimated: >60 mL/min (ref >60)
Glucose, Bld: 139 mg/dL — ABNORMAL HIGH (ref 70–99)
Potassium: 4.2 mmol/L (ref 3.5–5.1)
Sodium: 136 mmol/L (ref 135–145)

## 2023-11-07 LAB — T4, FREE: Free T4: 0.95 ng/dL (ref 0.61–1.12)

## 2023-11-07 LAB — ECHOCARDIOGRAM COMPLETE
AR max vel: 1.14 cm2
AV Area VTI: 1.08 cm2
AV Area mean vel: 1.09 cm2
AV Mean grad: 26 mmHg
AV Peak grad: 39.4 mmHg
Ao pk vel: 3.14 m/s
Area-P 1/2: 5.31 cm2
Calc EF: 59.2 %
Height: 64 in
Single Plane A2C EF: 56.9 %
Single Plane A4C EF: 60.3 %
Weight: 1992.96 [oz_av]

## 2023-11-07 LAB — PHOSPHORUS: Phosphorus: 3.4 mg/dL (ref 2.5–4.6)

## 2023-11-07 LAB — GLUCOSE, CAPILLARY
Glucose-Capillary: 123 mg/dL — ABNORMAL HIGH (ref 70–99)
Glucose-Capillary: 125 mg/dL — ABNORMAL HIGH (ref 70–99)
Glucose-Capillary: 131 mg/dL — ABNORMAL HIGH (ref 70–99)
Glucose-Capillary: 173 mg/dL — ABNORMAL HIGH (ref 70–99)
Glucose-Capillary: 194 mg/dL — ABNORMAL HIGH (ref 70–99)
Glucose-Capillary: 210 mg/dL — ABNORMAL HIGH (ref 70–99)
Glucose-Capillary: 224 mg/dL — ABNORMAL HIGH (ref 70–99)

## 2023-11-07 LAB — BRAIN NATRIURETIC PEPTIDE: B Natriuretic Peptide: 50.7 pg/mL (ref 0.0–100.0)

## 2023-11-07 LAB — TSH: TSH: 4.013 u[IU]/mL (ref 0.350–4.500)

## 2023-11-07 LAB — MAGNESIUM: Magnesium: 2 mg/dL (ref 1.7–2.4)

## 2023-11-07 LAB — AMMONIA: Ammonia: 70 umol/L — ABNORMAL HIGH (ref 9–35)

## 2023-11-07 MED ORDER — DILTIAZEM HCL 25 MG/5ML IV SOLN: 10.0000 mg | Freq: Four times a day (QID) | INTRAVENOUS | Status: DC | PRN

## 2023-11-07 MED ORDER — LACTATED RINGERS IV BOLUS
1000.0000 mL | Freq: Once | INTRAVENOUS | Status: AC
  Administered 2023-11-07: 1000 mL via INTRAVENOUS

## 2023-11-07 MED ORDER — ADENOSINE 6 MG/2ML IV SOLN: 6.0000 mg | Freq: Once | INTRAVENOUS | Status: AC

## 2023-11-07 MED ORDER — METOPROLOL TARTRATE 25 MG PO TABS
25.0000 mg | ORAL_TABLET | Freq: Three times a day (TID) | ORAL | Status: DC
  Administered 2023-11-07 – 2023-11-11 (×13): 25 mg
  Filled 2023-11-07 (×13): qty 1

## 2023-11-07 MED ORDER — AMIODARONE HCL 200 MG PO TABS
200.0000 mg | ORAL_TABLET | Freq: Every day | ORAL | Status: DC
  Administered 2023-11-07 – 2023-11-11 (×5): 200 mg
  Filled 2023-11-07 (×5): qty 1

## 2023-11-07 MED ORDER — ADENOSINE 6 MG/2ML IV SOLN
INTRAVENOUS | Status: AC
  Administered 2023-11-07: 6 mg via INTRAVENOUS
  Filled 2023-11-07: qty 6

## 2023-11-07 NOTE — Consult Note (Signed)
 CARDIOLOGY CONSULT NOTE       Patient ID: ROSENE PILLING MRN: [id number] DOB/AGE: Apr 10, 1951 73 y.o.  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 asked to evaluate by Dr Zelda Hickman for SVT. Admitted 5/15 with complicated ICU stay. ETOH abuse, with fall at home ICH, subdural and stroke. Dense left hemiplegia with intubation to protect airway and seizures on Keppra . Currently extubated with feeding tube in. Per husband she was talking a bit more last couple of days. She had IVC filter placed for peroneal DVT. She was Rx for LLL pneumonia prior to extubation. TTE shows normal EF and moderate AS. No cardiac history prior to admission. Review of ECG/telemetry shows NSR. No acute ECG changes. Telemetry with narrow complex tachycardia consistent with SVT. Most recent episode terminated with Adenosine  6 mg She is on chronic prednisone  for autoimmune hepatitis/cirrhosis made worse by ETOH  Prior to admission her and husband working for [employer] facility. She has been out last few weeks with back issues.   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   GERD (gastroesophageal reflux disease)    on meds   Hiatal hernia    Hx of adenomatous polyp of colon 07/07/2009   Hyperlipidemia    diet controlled   Hypertension    on meds - LASIX    IBS (irritable bowel syndrome)    Iron  deficiency anemia    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 08/25/2016   Osteoporosis    Sinusitis, chronic    Thyroid  nodule    Umbilical hernia    Uterine fibroid    Vascular disease    Vitamin D deficiency     Family History  Problem Relation Age of Onset   Stroke Mother    Hypertension Mother    Colon cancer Father 35   Liver cancer Father 86   Colon polyps Father 27   Heart disease Sister        hole in heart   Pancreatic cancer Maternal Aunt    Brain cancer Maternal Uncle    Stroke Maternal Grandmother    Hypertension Maternal Grandmother    Esophageal cancer Maternal Grandfather    Throat cancer Maternal Grandfather  Breast cancer Paternal Grandmother    Brain cancer Paternal Grandmother    Lung cancer Paternal Grandfather    Rectal cancer Neg Hx    Stomach cancer Neg Hx     Social History   Socioeconomic History   Marital status: Married    Spouse name: Not on file   Number of children: 0   Years of education: Not on file   Highest education level: Associate degree: occupational, Scientist, product/process development, or vocational program  Occupational History   Occupation: Therapist, nutritional    Comment: [employer]  Tobacco Use   Smoking status: Never   Smokeless tobacco: Never  Vaping Use   Vaping status: Never Used   Substance and Sexual Activity   Alcohol use: Yes    Comment: champagne on weekends   Drug use: No   Sexual activity: Yes    Partners: Male    Birth control/protection: Post-menopausal  Other Topics Concern   Not on file  Social History Narrative   Divorced, no children   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 [employer]   Social Drivers of Corporate investment banker Strain: Low Risk  (04/21/2023)   Overall Financial Resource Strain (CARDIA)    Difficulty of Paying Living Expenses: Not hard at all  Food Insecurity: No Food Insecurity (04/21/2023)   Hunger Vital Sign    Worried About Running Out of Food in the Last Year: Never true    Ran Out of Food in the Last Year: Never true  Transportation Needs: No Transportation Needs (04/21/2023)   PRAPARE - Administrator, Civil Service (Medical): No    Lack of Transportation (Non-Medical): No  Physical Activity: Insufficiently Active (04/21/2023)   Exercise Vital Sign    Days of Exercise per Week: 4 days    Minutes of Exercise per Session: 20 min  Stress: Stress Concern Present (04/21/2023)   [employer] of Occupational Health - Occupational Stress Questionnaire    Feeling of Stress : To some extent  Social Connections: Moderately Integrated (04/21/2023)   Social Connection and Isolation Panel [NHANES]    Frequency of Communication with Friends and Family: Twice a week    Frequency of Social Gatherings with Friends and Family: Once a week    Attends Religious Services: 1 to 4 times per year    Active Member of [employer] or Organizations: No    Attends Engineer, structural: Not on file    Marital Status: Married  Catering manager Violence: Not on file    Past Surgical History:  Procedure Laterality Date   BREAST EXCISIONAL BIOPSY Left    BREAST EXCISIONAL BIOPSY Right    CATARACT EXTRACTION Bilateral    COLONOSCOPY  07/18/2020   per Dr. Willy Harvest, adenimatous polyps, repeat in 5 yrs    ESOPHAGOGASTRODUODENOSCOPY  07/06/2021   per Dr. Willy Harvest, showed portal hypertensive gastropathy, repeat in 2 yrs   ESOPHAGOGASTRODUODENOSCOPY (EGD) WITH PROPOFOL  N/A 06/20/2017   Procedure: ESOPHAGOGASTRODUODENOSCOPY (EGD) WITH PROPOFOL ;  Surgeon: Kenney Peacemaker, MD;  Location: [facility];  Service: Endoscopy;  Laterality: N/A;  with Barrx   EYE SURGERY     IR IVC FILTER PLMT / S&I /IMG GUID/MOD SED  10/31/2023   NASAL SEPTOPLASTY W/ TURBINOPLASTY     POLYPECTOMY  2015   CG-TA   TONSILLECTOMY        Current Facility-Administered Medications:    acetaminophen  (TYLENOL ) 160 MG/5ML solution 650 mg, 650 mg, Per Tube, Q6H PRN, Desai, Nikita S, MD, 650 mg  at 11/07/23 0910   aspirin  chewable tablet 81 mg, 81 mg, Per Tube, Daily, 81 mg at 11/07/23 0910 **OR** aspirin  suppository 300 mg, 300 mg, Rectal, Daily, Desai, Nikita S, MD   Chlorhexidine  Gluconate Cloth 2 % PADS 6 each, 6 each, Topical, Daily, Desai, Nikita S, MD, 6 each at 11/07/23 0911   cyanocobalamin  (VITAMIN B12) tablet 500 mcg, 500 mcg, Per Tube, Daily, Desai, Nikita S, MD, 500 mcg at 11/07/23 0910   diltiazem (CARDIZEM) injection 10 mg, 10 mg, Intravenous, Q6H PRN, Singh, Prashant K, MD   ezetimibe  (ZETIA ) tablet 10 mg, 10 mg, Per Tube, Daily, Desai, Nikita S, MD, 10 mg at 11/07/23 0910   feeding supplement (OSMOLITE 1.5 CAL) liquid 1,000 mL, 1,000 mL, Per Tube, Continuous, Desai, Nikita S, MD, Last Rate: 45 mL/hr at 11/07/23 0900, Infusion Verify at 11/07/23 0900   feeding supplement (PROSource TF20) liquid 60 mL, 60 mL, Per Tube, Daily, Desai, Nikita S, MD, 60 mL at 11/07/23 0910   fiber supplement (BANATROL TF) liquid 60 mL, 60 mL, Per Tube, BID, Desai, Nikita S, MD, 60 mL at 11/07/23 0910   hydrALAZINE  (APRESOLINE ) injection 10 mg, 10 mg, Intravenous, Q4H PRN, Desai, Nikita S, MD, 10 mg at 11/01/23 1230   insulin  aspart (novoLOG ) injection 0-20 Units, 0-20 Units, Subcutaneous, Q4H, Desai, Nikita S, MD, 3 Units at 11/07/23 9811    insulin  aspart (novoLOG ) injection 8 Units, 8 Units, Subcutaneous, Q4H, Desai, Nikita S, MD, 8 Units at 11/07/23 0912   insulin  glargine-yfgn (SEMGLEE ) injection 25 Units, 25 Units, Subcutaneous, Daily, Desai, Nikita S, MD, 25 Units at 11/07/23 0916   lactated ringers  bolus 1,000 mL, 1,000 mL, Intravenous, Once, Cala Castleman, MD   levETIRAcetam  (KEPPRA ) tablet 1,000 mg, 1,000 mg, Per Tube, BID, Desai, Nikita S, MD, 1,000 mg at 11/07/23 9147   multivitamin with minerals tablet 1 tablet, 1 tablet, Per Tube, Daily, Desai, Nikita S, MD, 1 tablet at 11/07/23 0910   Oral care mouth rinse, 15 mL, Mouth Rinse, 4 times per day, Aleck Hurdle, MD, 15 mL at 11/07/23 8295   Oral care mouth rinse, 15 mL, Mouth Rinse, PRN, Desai, Nikita S, MD   pantoprazole  (PROTONIX ) injection 40 mg, 40 mg, Intravenous, QHS, Desai, Nikita S, MD, 40 mg at 11/07/23 0104   predniSONE  (DELTASONE ) tablet 10 mg, 10 mg, Per Tube, Q breakfast, Desai, Nikita S, MD, 10 mg at 11/07/23 6213   thiamine  (VITAMIN B1) tablet 100 mg, 100 mg, Per Tube, Daily, Desai, Nikita S, MD, 100 mg at 11/07/23 0910  aspirin   81 mg Per Tube Daily   Or   aspirin   300 mg Rectal Daily   Chlorhexidine  Gluconate Cloth  6 each Topical Daily   cyanocobalamin   500 mcg Per Tube Daily   ezetimibe   10 mg Per Tube Daily   feeding supplement (PROSource TF20)  60 mL Per Tube Daily   fiber supplement (BANATROL TF)  60 mL Per Tube BID   insulin  aspart  0-20 Units Subcutaneous Q4H   insulin  aspart  8 Units Subcutaneous Q4H   insulin  glargine-yfgn  25 Units Subcutaneous Daily   levETIRAcetam   1,000 mg Per Tube BID   multivitamin with minerals  1 tablet Per Tube Daily   mouth rinse  15 mL Mouth Rinse 4 times per day   pantoprazole  (PROTONIX ) IV  40 mg Intravenous QHS   predniSONE   10 mg Per Tube Q breakfast   thiamine   100 mg Per Tube Daily  feeding supplement (OSMOLITE 1.5 CAL) 45 mL/hr at 11/07/23 0900   lactated ringers       Physical Exam: Blood  pressure 119/67, pulse (!) 108, temperature 98 F (36.7 C), temperature source Axillary, resp. rate (!) 28, height 5\' 4"  (1.626 m), weight 56.5 kg, SpO2 99%.   Frail elderly female Feeding tube AS murmur no AR  Lungs clear Abdomen benign Multiple bruises /skin lesions on legs No edema Neuro with aphasia and left hemiplegia  Labs:   Lab Results  Component Value Date   WBC 9.2 11/07/2023   HGB 10.5 (L) 11/07/2023   HCT 32.9 (L) 11/07/2023   MCV 101.5 (H) 11/07/2023   PLT 226 11/07/2023    Recent Labs  Lab 11/01/23 0545 11/02/23 0616 11/07/23 0455  NA 141   < > 136  K 3.3*   < > 4.2  CL 108   < > 103  CO2 23   < > 19*  BUN 11   < > 20  CREATININE 0.39*   < > 0.45  CALCIUM  7.5*   < > 8.7*  PROT 5.9*  --   --   BILITOT 0.6  --   --   ALKPHOS 104  --   --   ALT 20  --   --   AST 30  --   --   GLUCOSE 269*   < > 139*   < > = values in this interval not displayed.   No results found for: "CKTOTAL", "CKMB", "CKMBINDEX", "TROPONINI"  Lab Results  Component Value Date   CHOL 182 11/05/2023   CHOL 163 11/04/2023   CHOL 195 02/25/2022   Lab Results  Component Value Date   HDL 39 (L) 11/05/2023   HDL 31 (L) 11/04/2023   HDL 52.70 02/25/2022   Lab Results  Component Value Date   LDLCALC 119 (H) 11/05/2023   LDLCALC 106 (H) 11/04/2023   LDLCALC 113 (H) 02/25/2022   Lab Results  Component Value Date   TRIG 119 11/05/2023   TRIG 128 11/04/2023   TRIG 111 10/31/2023   Lab Results  Component Value Date   CHOLHDL 4.7 11/05/2023   CHOLHDL 5.3 11/04/2023   CHOLHDL 4 02/25/2022   Lab Results  Component Value Date   LDLDIRECT 133.0 09/15/2021   LDLDIRECT 158.0 05/15/2021   LDLDIRECT 153.0 02/27/2019      Radiology: DG Chest Port 1 View Result Date: 11/07/2023 CLINICAL DATA:  Shortness of breath. EXAM: PORTABLE CHEST 1 VIEW COMPARISON:  10/31/2023 FINDINGS: Interval extubation. Left IJ central line has been removed in the interval. A feeding tube passes into  the stomach although the distal tip position is not included on the film. The cardio pericardial silhouette is enlarged. The lungs are clear without focal pneumonia, edema, pneumothorax or pleural effusion. Bones are diffusely demineralized. Telemetry leads overlie the chest. IMPRESSION: 1. Interval extubation and removal of left IJ central line. 2. No acute cardiopulmonary findings. Electronically Signed   By: Donnal Fusi M.D.   On: 11/07/2023 07:17   CT ANGIO HEAD NECK W WO CM Result Date: 11/05/2023 CLINICAL DATA:  Stroke workup EXAM: CT ANGIOGRAPHY HEAD AND NECK WITH AND WITHOUT CONTRAST TECHNIQUE: Multidetector CT imaging of the head and neck was performed using the standard protocol during bolus administration of intravenous contrast. Multiplanar CT image reconstructions and MIPs were obtained to evaluate the vascular anatomy. Carotid stenosis measurements (when applicable) are obtained utilizing NASCET criteria, using the distal internal carotid diameter as the denominator.  RADIATION DOSE REDUCTION: This exam was performed according to the departmental dose-optimization program which includes automated exposure control, adjustment of the mA and/or kV according to patient size and/or use of iterative reconstruction technique. CONTRAST:  75mL OMNIPAQUE  IOHEXOL  350 MG/ML SOLN COMPARISON:  Brain MRI from yesterday and head CT from 3 days ago FINDINGS: CT HEAD FINDINGS Brain: Hemorrhagic contusions in subdural collection show no interval increase. No evidence of progressive white matter infarction best seen by recent MRI. No hydrocephalus or shift Vascular: No hyperdense vessel or unexpected calcification. Skull: Left posterior scalp swelling with linear fracture in the posterior left calvarium that is nondepressed. Sinuses/Orbits: Mild mastoid and sinus opacification in the setting of nasal intubation Review of the MIP images confirms the above findings CTA NECK FINDINGS Aortic arch: Atheromatous plaque with  3 vessel branching Right carotid system: Moderate mixed density plaque centered at the bifurcation without ulceration or significant stenosis. Left carotid system: Moderate mixed density plaque at the proximal left ICA without ulceration or flow reducing stenosis. Vertebral arteries: No proximal subclavian stenosis. Atheromatous plaque at both vertebral origins with right V1 stenosis approaching 50%. No dissection, beading, or ulceration. Skeleton: Generalized cervical spine degeneration.  No acute finding Other neck: No acute finding Upper chest: Clear apical lungs Review of the MIP images confirms the above findings CTA HEAD FINDINGS Anterior circulation: Mild atheromatous calcification on the cavernous carotids. Generalized medium vessel undulation attributed to atherosclerosis, no correctable in flow reducing proximal stenosis. No branch occlusion, beading, or aneurysm Posterior circulation: Left dominant vertebral artery. Vertebral and basilar arteries are sufficiently patent with moderate calcified plaque at the left V4 segment. Fetal type right PCA flow. No branch occlusion, beading, or aneurysm. Venous sinuses: Unremarkable for arterial timing Anatomic variants: As above Review of the MIP images confirms the above findings IMPRESSION: No emergent finding. Cervical and intracranial atherosclerosis with nearly 50% stenosis at the right vertebral origin. Electronically Signed   By: Ronnette Coke M.D.   On: 11/05/2023 05:21   MR BRAIN WO CONTRAST Result Date: 11/04/2023 CLINICAL DATA:  Head trauma, moderate to severe. EXAM: MRI HEAD WITHOUT CONTRAST TECHNIQUE: Multiplanar, multiecho pulse sequences of the brain and surrounding structures were obtained without intravenous contrast. COMPARISON:  Head CT from 2 days ago FINDINGS: Brain: Small areas of restricted diffusion with nodular and bandlike appearance clustered in the deep right cerebral white matter, centered at the corona radiata, and in the left  posterior frontal white matter. Some diffusion restriction along the surface of the posterior right temporal lobe which is likely contusion with susceptibility from subarachnoid blood products when compared with initial imaging. Seizure phenomenon is also considered, there has been EEG monitoring based on prior head CT. Known subdural hematoma along the right more than left cerebral convexity, greatest at the anterior right frontal lobe measuring up to 6 mm thickness. Hemorrhagic contusion in the right anterior inferior frontotemporal region and low left cerebellum. Hygroma along the left frontal convexity up to 5 mm in thickness. 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 No hydrocephalus. Vascular: Major flow voids are preserved. Skull and upper cervical spine: Normal marrow signal. Sinuses/Orbits: Partial right mastoid opacification IMPRESSION: Small acute infarcts in the right more than left cerebral white matter. 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 Electronically Signed   By: Ronnette Coke M.D.   On: 11/04/2023 10:54   CT HEAD WO CONTRAST ( ) Result Date: 11/02/2023 CLINICAL DATA:  Subdural hematoma EXAM: CT HEAD WITHOUT CONTRAST TECHNIQUE: Contiguous  axial images were obtained from the base of the skull through the vertex without intravenous contrast. RADIATION DOSE REDUCTION: This exam was performed according to the departmental dose-optimization program which includes automated exposure control, adjustment of the mA and/or kV according to patient size and/or use of iterative reconstruction technique. COMPARISON:  Four days prior FINDINGS: Brain: Patchy subarachnoid hemorrhage along the cerebral convexities is non progressed. Hemorrhagic contusions in the anterior temporal and inferior frontal lobes on the right and in the left cerebellum. The inferior right frontal hematoma measures up to 2.9 cm. The left cerebellar hematoma measures 2.3 cm. Expected regional  edema. Mixed density subdural hematoma along the left frontal convexity measures up to 5 mm in thickness without worrisome mass effect. No hydrocephalus, or shift. Perforator infarct in the right corona radiata Vascular: No hyperdense vessel or unexpected calcification. Skull: Nondepressed posterior left calvarium fracture with overlying soft tissue gas. Partial right mastoid opacification in the setting of intubation. No superimposed fracture seen. Sinuses/Orbits: Negative IMPRESSION: No progression of multifocal ICH as described. No mass effect or hydrocephalus. Perforator infarct in the right corona radiata that has occurred since admission. Electronically Signed   By: Ronnette Coke M.D.   On: 11/02/2023 04:05   IR IVC FILTER PLMT / S&I Dan Dun GUID/MOD SED Result Date: 10/31/2023 CLINICAL DATA:  Intracranial hemorrhage and acute right peroneal vein DVT. EXAM: 1. ULTRASOUND GUIDANCE FOR VASCULAR ACCESS OF THE RIGHT INTERNAL JUGULAR VEIN. 2. IVC VENOGRAM. 3. PERCUTANEOUS IVC FILTER PLACEMENT. ANESTHESIA/SEDATION: None CONTRAST:  30mL OMNIPAQUE  IOHEXOL  300 MG/ML  SOLN FLUOROSCOPY: Radiation Exposure Index: 17 mGy Kerma PROCEDURE: The procedure, risks, benefits, and alternatives were explained to the patient's husband. Questions regarding the procedure were encouraged and answered. The patient's husband understands and consents to the procedure. A time-out was performed prior to initiating the procedure. The right neck was prepped with chlorhexidine  in a sterile fashion, and a sterile drape was applied covering the operative field. A sterile gown and sterile gloves were used for the procedure. Local anesthesia was provided with 1% Lidocaine . Ultrasound was utilized to confirm patency of the right internal jugular vein. An ultrasound image was saved and recorded. Under direct ultrasound guidance, a 21 gauge needle was advanced into the right internal jugular vein with ultrasound image documentation performed. After  securing access with a micropuncture dilator, a guidewire was advanced into the inferior vena cava. A deployment sheath was advanced over the guidewire. This was utilized to perform IVC venography. The deployment sheath was further positioned in an appropriate location for filter deployment. A Bard Denali IVC filter was then advanced in the sheath. This was then fully deployed in the infrarenal IVC. Final filter position was confirmed with a fluoroscopic spot image. After the procedure the sheath was removed and hemostasis obtained with manual compression. COMPLICATIONS: None. FINDINGS: IVC venography demonstrates a normal caliber IVC with no evidence of thrombus. Renal veins are identified bilaterally. The IVC filter was successfully positioned below the level of the renal veins and is appropriately oriented. This IVC filter has both permanent and retrievable indications. IMPRESSION: Placement of percutaneous IVC filter in infrarenal IVC. IVC venogram shows no evidence of IVC thrombus and normal caliber of the inferior vena cava. This filter does have both permanent and retrievable indications. PLAN: This IVC filter is potentially retrievable. The patient will be assessed for filter retrieval by Interventional Radiology in approximately 8-12 weeks. Further recommendations regarding filter retrieval, continued surveillance or declaration of device permanence, will be made at that time. Electronically  Signed   By: Erica Hau M.D.   On: 10/31/2023 17:35   VAS US  LOWER EXTREMITY VENOUS (DVT) Result Date: 10/31/2023  Lower Venous DVT Study Patient Name:  BLOSSOM CRUME  Date of Exam:   10/31/2023 Medical Rec #: [id number]      Accession #:    [id number] Date of Birth: 25-Mar-1951     Patient Gender: F Patient Age:   55 years Exam Location:  [facility] Procedure:      VAS US  LOWER EXTREMITY VENOUS (DVT) Referring Phys: Phyllis Breeze  --------------------------------------------------------------------------------  Indications: Edema. Other Indications: Status post fall with resulting subdural and subarachnoid                    hemorrhage and also occipital skull fracture with large scalp                    hematoma. Comparison Study: No prior LEV on file Performing Technologist: Carleene Chase RVS  Examination Guidelines: A complete evaluation includes B-mode imaging, spectral Doppler, color Doppler, and power Doppler as needed of all accessible portions of each vessel. Bilateral testing is considered an integral part of a complete examination. Limited examinations for reoccurring indications may be performed as noted. The reflux portion of the exam is performed with the patient in reverse Trendelenburg.  +---------+---------------+---------+-----------+----------+--------------+ RIGHT    CompressibilityPhasicitySpontaneityPropertiesThrombus Aging +---------+---------------+---------+-----------+----------+--------------+ CFV      Full           Yes      Yes                                 +---------+---------------+---------+-----------+----------+--------------+ SFJ      Full                                                        +---------+---------------+---------+-----------+----------+--------------+ FV Prox  Full                                                        +---------+---------------+---------+-----------+----------+--------------+ FV Mid   Full                                                        +---------+---------------+---------+-----------+----------+--------------+ FV DistalFull                                                        +---------+---------------+---------+-----------+----------+--------------+ PFV      Full                                                        +---------+---------------+---------+-----------+----------+--------------+  POP      Full            Yes      Yes                                 +---------+---------------+---------+-----------+----------+--------------+ PTV      Full                                                        +---------+---------------+---------+-----------+----------+--------------+ PERO     None                                         Acute          +---------+---------------+---------+-----------+----------+--------------+   +---------+---------------+---------+-----------+----------+-------------------+ LEFT     CompressibilityPhasicitySpontaneityPropertiesThrombus Aging      +---------+---------------+---------+-----------+----------+-------------------+ CFV      Full           Yes      Yes                                      +---------+---------------+---------+-----------+----------+-------------------+ SFJ      Full                                                             +---------+---------------+---------+-----------+----------+-------------------+ FV Prox  Full                                                             +---------+---------------+---------+-----------+----------+-------------------+ FV Mid   Full           Yes      Yes                                      +---------+---------------+---------+-----------+----------+-------------------+ FV DistalFull                                                             +---------+---------------+---------+-----------+----------+-------------------+ PFV      Full                                                             +---------+---------------+---------+-----------+----------+-------------------+ POP      Full           Yes  Yes                                      +---------+---------------+---------+-----------+----------+-------------------+ PTV      Full                                                              +---------+---------------+---------+-----------+----------+-------------------+ PERO                                                  Not well visualized +---------+---------------+---------+-----------+----------+-------------------+     Summary: RIGHT: - Findings consistent with acute deep vein thrombosis involving the right peroneal veins.   LEFT: - No evidence of deep vein thrombosis in the lower extremity. No indirect evidence of obstruction proximal to the inguinal ligament.   *See table(s) above for measurements and observations. Electronically signed by Runell Countryman on 10/31/2023 at 3:44:13 PM.    Final    CT Angio Chest Pulmonary Embolism (PE) W or WO Contrast Result Date: 10/31/2023 CLINICAL DATA:  Pulmonary embolism. EXAM: CT ANGIOGRAPHY CHEST WITH CONTRAST TECHNIQUE: Multidetector CT imaging of the chest was performed using the standard protocol during bolus administration of intravenous contrast. Multiplanar CT image reconstructions and MIPs were obtained to evaluate the vascular anatomy. RADIATION DOSE REDUCTION: This exam was performed according to the departmental dose-optimization program which includes automated exposure control, adjustment of the mA and/or kV according to patient size and/or use of iterative reconstruction technique. CONTRAST:  67mL OMNIPAQUE  IOHEXOL  350 MG/ML SOLN COMPARISON:  CT dated 10/26/2023. FINDINGS: Cardiovascular: There is no cardiomegaly or pericardial effusion. There is coronary vascular calcification of the LAD. There is calcification of the aortic valve. Mild atherosclerotic calcification of the thoracic aorta. No aneurysmal dilatation or dissection. Evaluation of the pulmonary arteries is limited due to respiratory motion. No central pulmonary artery embolus identified. Mediastinum/Nodes: No hilar or mediastinal adenopathy. An enteric tube noted in the esophagus extending into the stomach. No mediastinal fluid collection. Lungs/Pleura: Large area of  consolidation involving the left lower lobe with air bronchograms. There is a small left pleural effusion. Small areas of subpleural densities in the right lower lobe and lingula may represent atelectasis or pneumonia. There is no pneumothorax. Endotracheal tube approximately 3 cm above the carina. The central airways are patent. Upper Abdomen: Cirrhosis. Musculoskeletal: Osteopenia with degenerative changes spine. Compression fractures of the inferior T9 and superior T10 endplates as seen on the prior CT. No acute osseous pathology. Review of the MIP images confirms the above findings. IMPRESSION: 1. No CT evidence of central pulmonary artery embolus. 2. Left lower lobe pneumonia.  Follow-up to resolution recommended. 3. Cirrhosis. 4.  Aortic Atherosclerosis (ICD10-I70.0). Electronically Signed   By: Angus Bark M.D.   On: 10/31/2023 15:01   DG Chest Port 1 View Result Date: 10/31/2023 CLINICAL DATA:  Acute respiratory failure. EXAM: PORTABLE CHEST 1 VIEW COMPARISON:  10/29/2023 FINDINGS: Endotracheal tube in satisfactory position. Left jugular catheter tip in the superior vena cava. Stable dense left lower lobe airspace opacity. Interval small amount of linear atelectasis in the left mid lung zone.  Clear right lung. Nasogastric tube extending into the stomach. Mild scoliosis. IMPRESSION: 1. Stable dense left lower lobe atelectasis or pneumonia. 2. Interval small amount of linear atelectasis in the left mid lung zone. 3. Support apparatus in satisfactory position. Electronically Signed   By: Catherin Closs M.D.   On: 10/31/2023 11:40   CT HEAD WO CONTRAST ( ) Result Date: 10/29/2023 CLINICAL DATA:  73 year old female with multifocal intracranial hemorrhage status post fall. EXAM: CT HEAD WITHOUT CONTRAST TECHNIQUE: Contiguous axial images were obtained from the base of the skull through the vertex without intravenous contrast. RADIATION DOSE REDUCTION: This exam was performed according to the  departmental dose-optimization program which includes automated exposure control, adjustment of the mA and/or kV according to patient size and/or use of iterative reconstruction technique. COMPARISON:  Head CT yesterday and earlier. FINDINGS: Brain: There remains no midline shift. Confluent hemorrhagic contusions in the right anterior and inferior frontal and right anterior and lateral temporal lobes have not significantly changed. Small volume bilateral subarachnoid hemorrhage and questionable smaller left inferior temporal lobe hemorrhagic contusion (versus age coronal image 41) are stable. Superimposed hyperdense right anterior convexity small subdural hematoma and more broad-based but generally low-density left side subdural hematoma (4 mm) are stable. Confluent left inferior cerebellar hemorrhagic contusion with edema is stable. Basilar cisterns remain patent. Stable gray-white matter differentiation throughout the brain. No cortically based acute infarct identified. Vascular: Calcified atherosclerosis at the skull base. Skull: Stable nondisplaced left posterior convexity skull fracture extending to the left foramen magnum. Sinuses/Orbits: Visualized paranasal sinuses and mastoids are stable and well aerated. Other: Intubated and oral enteric tube partially visible. Left posterior convexity, right lateral convexity scalp hematomas which are not significantly changed. Orbits soft tissues appears stable and negative. Scalp EEG leads remain. IMPRESSION: 1. Stable extensive multifocal posttraumatic intracranial hemorrhage since yesterday. No midline shift. Basilar cisterns remain patent. 2. No new intracranial abnormality. 3. Stable nondisplaced left posterior skull fracture. Scalp hematomas. Electronically Signed   By: Marlise Simpers M.D.   On: 10/29/2023 09:24   DG Chest Port 1 View Result Date: 10/29/2023 CLINICAL DATA:  [id number]. Acute respiratory failure, ventilator dependent. EXAM: PORTABLE CHEST 1 VIEW COMPARISON:   Portable chest yesterday at 8:13 a.m. FINDINGS: 4:13 a.m. ETT tip is 5.4 cm from the carina, previously 4.1 cm. Left IJ line terminates at the superior cavoatrial junction. NGT enters the stomach with the intragastric course not evaluated. The heart is slightly enlarged. No vascular congestion is seen. Stable mediastinum with aortic atherosclerosis. Dense left lower lobe consolidation continues to be seen and there are bilateral minimal pleural effusions. The lungs are otherwise clear. Overall aeration seems unchanged. Osteopenia and thoracic spondylosis. IMPRESSION: 1. ETT tip 5.4 cm from the carina, previously 4.1 cm. 2. Left IJ line terminates at the superior cavoatrial junction. 3. NGT enters the stomach with the intragastric course not evaluated. 4. Dense left lower lobe consolidation and bilateral minimal pleural effusions. 5. Aortic atherosclerosis. Electronically Signed   By: Denman Fischer M.D.   On: 10/29/2023 04:46   CT HEAD WO CONTRAST ( ) Result Date: 10/28/2023 CLINICAL DATA:  73 year old female with multifocal intracranial hemorrhage, status post fall. EXAM: CT HEAD WITHOUT CONTRAST TECHNIQUE: Contiguous axial images were obtained from the base of the skull through the vertex without intravenous contrast. RADIATION DOSE REDUCTION: This exam was performed according to the departmental dose-optimization program which includes automated exposure control, adjustment of the mA and/or kV according to patient size and/or use of iterative reconstruction technique. COMPARISON:  Head CTs yesterday and earlier. FINDINGS: Brain: Right side anterior frontal convexity predominant mostly hyperdense subdural hematoma is 5-6 mm, stable. Superimposed confluent right inferior frontal gyrus and right anterior temporal lobe hemorrhagic contusions, in an area of slightly larger than 4 cm, stable. Surrounding edema stable. Bilateral subarachnoid hemorrhage, most apparent along the sylvian fissures, asymmetrically  greater on the right, stable. Left inferolateral cerebellar hemorrhagic contusion which is about 2.5 cm, stable. Small volume hyperdense blood layering on the tentorium, stable. A smaller predominantly low-density left side subdural hematoma measuring up to 4 mm is new since presentation and increased since yesterday, most apparent on coronal image 47. Small volume IVH limited to the 4th ventricle is stable. No ventriculomegaly. No significant midline shift. Basilar cisterns remain patent. Stable gray-white matter differentiation throughout the brain. No cortically based acute infarct identified. Vascular: Calcified atherosclerosis at the skull base. No suspicious intracranial vascular hyperdensity. Skull: Linear nondisplaced skull fracture tracking lateral to the left lambdoid suture from the inferior parietal bone region through to the left foramen magnum. No new osseous abnormality identified. Sinuses/Orbits: Visualized paranasal sinuses and mastoids are stable and well aerated. Other: Large, broad-based left posterior convexity scalp hematoma does not appear significantly changed. Underlying nondisplaced skull fracture. Orbits soft tissues appears stable.  Bilateral scalp EEG leads now. IMPRESSION: 1. Extensive multifocal posttraumatic intracranial hemorrhage, stable since yesterday with EXCEPT FOR: Small 4 mm mixed but predominantly low-density left Subdural Hematoma has increased (coronal image 46). 2. No midline shift or significant intracranial mass effect despite the findings. No ventriculomegaly. Stable basilar cisterns. 3. Nondisplaced left posterior convexity skull fracture with large overlying scalp hematoma. Electronically Signed   By: Marlise Simpers M.D.   On: 10/28/2023 10:02   DG CHEST PORT 1 VIEW Result Date: 10/28/2023 CLINICAL DATA:  [id number] Endotracheal tube present [id number] EXAM: PORTABLE CHEST 1 VIEW COMPARISON:  10/28/2023, 6:19 a.m. FINDINGS: Re-demonstration of left retrocardiac airspace opacity  obscuring the left hemidiaphragm, descending thoracic aorta and blunting the left lateral costophrenic angle, suggesting combination of left lung atelectasis and/or consolidation with pleural effusion. No significant interval change. Bilateral lung fields are clear. Bilateral costophrenic angles are clear. Stable cardio-mediastinal silhouette. No acute osseous abnormalities. The soft tissues are within normal limits. Tube/lines: Endotracheal tube is seen with its tip approximately 4.1 cm above the carina. Enteric tube is seen coursing below the left hemidiaphragm however, the tip and side hole are not included in the film. Left IJ central venous catheter noted with its tip overlying the cavoatrial junction region. IMPRESSION: 1. Repositioning of the endotracheal tube. 2. Otherwise, no significant interval change since the prior study. Stable left retrocardiac opacity, as described above. 3. Support apparatus as described above. Electronically Signed   By: Beula Brunswick M.D.   On: 10/28/2023 08:27   DG Abd Portable 1V Result Date: 10/28/2023 CLINICAL DATA:  [id number] Encounter for orogastric (OG) tube placement [id number]. EXAM: PORTABLE ABDOMEN - 1 VIEW COMPARISON:  None Available. FINDINGS: The bowel gas pattern is non-obstructive. No evidence of pneumoperitoneum, within the limitations of a supine film. No acute osseous abnormalities. The soft tissues are within normal limits. Surgical changes, devices, tubes and lines: Enteric tube is seen coursing below the left hemidiaphragm with its side hole overlying the left side of the lumbar spine (stomach body region) and tip overlying the right lower quadrant, overlying the region of pylorus of the stomach. IMPRESSION: *Enteric tube is seen coursing below the left hemidiaphragm with its side hole overlying the left side of the  lumbar spine (stomach body region) and tip overlying the right lower quadrant, overlying the region of pylorus of the stomach. Electronically Signed    By: Beula Brunswick M.D.   On: 10/28/2023 08:25   DG Chest Port 1 View Addendum Date: 10/28/2023 ADDENDUM REPORT: 10/28/2023 07:19 ADDENDUM: Critical Value/emergent results were called by telephone at the time of interpretation on 10/28/2023 at 7:18 am to provider Dr. Waylan Haggard, who verbally acknowledged these results. Electronically Signed   By: Donnal Fusi M.D.   On: 10/28/2023 07:19   Result Date: 10/28/2023 CLINICAL DATA:  Acute intracranial hemorrhage. Ventilator dependence. EXAM: PORTABLE CHEST 1 VIEW COMPARISON:  Earlier same day. FINDINGS: Left base collapse/consolidation with small left pleural effusion is similar to prior. Endotracheal tube tip is in the right mainstem bronchus, about 17 mm below the bottom of the carina. Endotracheal tube could be retracted approximately 4 cm to place the tip in the distal esophagus. Left IJ central line tip overlies the distal SVC level. The NG tube passes into the stomach although the distal tip position is not included on the film. Telemetry leads overlie the chest. IMPRESSION: 1. Endotracheal tube tip is in the right mainstem bronchus, about 17 mm below the bottom of the carina. Endotracheal tube could be retracted approximately 4 cm to place the tip in the distal esophagus. Repeat chest x-ray recommended after endotracheal tube repositioning. 2. Left base collapse/consolidation with small left pleural effusion, similar to prior. Electronically Signed: By: Donnal Fusi M.D. On: 10/28/2023 06:49   Overnight EEG with video Result Date: 10/28/2023 Arleene Lack, MD     10/29/2023  6:46 AM Patient Name: KENITRA LEVENTHAL MRN: [id number] Epilepsy Attending: Arleene Lack Referring Physician/Provider: Josiah Nigh, MD Duration: 10/27/2023 1319 to 10/28/2023 1319 Patient history: 72yo F with right occipital fracture with acute subarachnoid hemorrhage, multiple contusions and a right subdural hematoma. EEG to evaluate for seizure Level of alertness: comatose/  lethargic AEDs during EEG study: LEV, propofol  Technical aspects: This EEG study was done with scalp electrodes positioned according to the 10-20 International system of electrode placement. Electrical activity was reviewed with band pass filter of 1-70Hz , sensitivity of 7 uV/mm, display speed of 7mm/sec with a 60Hz  notched filter applied as appropriate. EEG data were recorded continuously and digitally stored.  Video monitoring was available and reviewed as appropriate. Description: EEG showed continuous generalized and lateralized right hemisphere 3 to 6 Hz theta-delta slowing with overriding 12-14hz  beta activity. Hyperventilation and photic stimulation were not performed.   ABNORMALITY - Continuous slow, generalized and lateralized right hemisphere IMPRESSION: This study is suggestive of cortical dysfunction arising from right hemisphere likely secondary to underlying structural abnormality. Additionally there is moderate to severe diffuse encephalopathy likely related to sedation. No seizures or epileptiform discharges were seen throughout the recording. Priyanka Suzanne Erps   CT HEAD POST STROKE FOLLOWUP/TIMED/STAT READ Result Date: 10/27/2023 CLINICAL DATA:  Neuro deficit, acute, stroke suspected. EXAM: CT HEAD WITHOUT CONTRAST TECHNIQUE: Contiguous axial images were obtained from the base of the skull through the vertex without intravenous contrast. RADIATION DOSE REDUCTION: This exam was performed according to the departmental dose-optimization program which includes automated exposure control, adjustment of the mA and/or kV according to patient size and/or use of iterative reconstruction technique. COMPARISON:  Prior head CT examinations 10/27/2023 and earlier. FINDINGS: Brain: An acute subdural hematoma along the right cerebral convexity has undergone some interval redistribution but has not significantly changed in overall extent. The hematoma now measures up to 8  mm in thickness, and as before, is  greatest along the right frontal lobe. Continued slight interval increase in extent of acute subarachnoid hemorrhage scattered along the right cerebral convexity. Progressive edema at sites of hemorrhagic parenchymal contusions within the anteroinferior right frontal lobe and anterior right temporal lobe. Small-volume subarachnoid hemorrhage scattered along the left cerebral convexity has mildly increased. Possible small underlying hemorrhagic parenchymal contusions within the anterolateral left temporal lobe, as before. Thin acute subdural hemorrhage layering along the tentorium bilaterally, unchanged. 2.3 x 2.6 cm acute parenchymal hemorrhage within the left cerebellar hemisphere, unchanged in size. Trace subarachnoid hemorrhage again questioned at the inferior aspect of the fourth ventricle (versus choroid plexus) (for instance as seen on series 5, image 28). No midline shift or hydrocephalus. Vascular: No hyperdense vessel.  Atherosclerotic calcifications. Skull: Known non-depressed fracture of the left occipital calvarium. Sinuses/Orbits: No mass or acute finding within the imaged orbits. Minimal mucosal thickening within the bilateral maxillary sinuses at the imaged levels. Minimal mucosal thickening within the bilateral sphenoid and right ethmoid sinuses. Other: Posterior scalp hematomas. IMPRESSION: 1. An acute subdural hematoma along the right cerebral convexity has undergone some interval redistribution but has not significantly changed in overall extent. 2. Continued slight interval increase in extent of acute subarachnoid hemorrhage scattered along the right cerebral convexity. 3. Progressive edema at sites of hemorrhagic parenchymal contusions within the anteroinferior right frontal lobe and anterior right temporal lobe. 4. Small-volume subarachnoid hemorrhage scattered along the left cerebral convexity has mildly increased. Possible small underlying hemorrhagic parenchymal contusions within the  anterolateral left temporal lobe, as before. 5. Thin acute subdural hemorrhage layering along the tentorium bilaterally, unchanged. 6. 2.3 x 2.6 cm acute parenchymal hemorrhage within the left cerebellar hemisphere, unchanged. 7. Trace subarachnoid hemorrhage again questioned at the inferior aspect of the fourth ventricle. 8. No midline shift or hydrocephalus. 9. Known non-depressed fracture of the left occipital calvarium. Electronically Signed   By: Bascom Lily D.O.   On: 10/27/2023 16:39   ECHOCARDIOGRAM COMPLETE Result Date: 10/27/2023    ECHOCARDIOGRAM REPORT   Patient Name:   LARIE MATHES Date of Exam: 10/27/2023 Medical Rec #:  [id number]     Height:       64.0 in Accession #:    [id number]    Weight:       121.3 lb Date of Birth:  [birth year]    BSA:          1.582 m Patient Age:    72 years      BP:           186/91 mmHg Patient Gender: F             HR:           103 bpm. Exam Location:  Inpatient Procedure: 2D Echo, Cardiac Doppler and Color Doppler (Both Spectral and Color            Flow Doppler were utilized during procedure). Indications:    R55 Syncope  History:        Patient has prior history of Echocardiogram examinations, most                 recent 11/11/2020. Stroke; Risk Factors:Hypertension,                 Dyslipidemia and Diabetes. SDH.  Sonographer:    Raynelle Callow RDCS Referring Phys: [id number] Tania Familia [facility]  Sonographer Comments: Technically difficult study due to poor echo windows and echo performed with  patient supine and on artificial respirator. Patient with EEG adhesive leads in parasternal region. Patient intubated, restrained and moving throughout exam. IMPRESSIONS  1. Left ventricular ejection fraction, by estimation, is 65 to 70%. The left ventricle has normal function. The left ventricle has no regional wall motion abnormalities. There is severe left ventricular hypertrophy of the basal-septal segment. Left ventricular diastolic parameters are indeterminate. Elevated left  atrial pressure.  2. Right ventricular systolic function is normal. The right ventricular size is normal. There is normal pulmonary artery systolic pressure. The estimated right ventricular systolic pressure is 28.1 mmHg.  3. The mitral valve is normal in structure. Trivial mitral valve regurgitation. No evidence of mitral stenosis.  4. The aortic valve has an indeterminant number of cusps. There is moderate calcification of the aortic valve. There is moderate thickening of the aortic valve. Aortic valve regurgitation is not visualized. Moderate aortic valve stenosis. Aortic valve area, by VTI measures 1.10 cm. Aortic valve mean gradient measures 32.0 mmHg. Aortic valve Vmax measures 3.26 m/s.  5. The inferior vena cava is dilated in size with >50% respiratory variability, suggesting right atrial pressure of 8 mmHg. FINDINGS  Left Ventricle: Left ventricular ejection fraction, by estimation, is 65 to 70%. The left ventricle has normal function. The left ventricle has no regional wall motion abnormalities. The left ventricular internal cavity size was normal in size. There is  severe left ventricular hypertrophy of the basal-septal segment. Left ventricular diastolic parameters are indeterminate. Elevated left atrial pressure. Right Ventricle: The right ventricular size is normal. No increase in right ventricular wall thickness. Right ventricular systolic function is normal. There is normal pulmonary artery systolic pressure. The tricuspid regurgitant velocity is 2.24 m/s, and  with an assumed right atrial pressure of 8 mmHg, the estimated right ventricular systolic pressure is 28.1 mmHg. Left Atrium: Left atrial size was normal in size. Right Atrium: Right atrial size was normal in size. Pericardium: There is no evidence of pericardial effusion. Mitral Valve: The mitral valve is normal in structure. Mild to moderate mitral annular calcification. Trivial mitral valve regurgitation. No evidence of mitral valve  stenosis. MV peak gradient, 6.2 mmHg. The mean mitral valve gradient is 3.0 mmHg. Tricuspid Valve: The tricuspid valve is normal in structure. Tricuspid valve regurgitation is not demonstrated. No evidence of tricuspid stenosis. Aortic Valve: The aortic valve has an indeterminant number of cusps. There is moderate calcification of the aortic valve. There is moderate thickening of the aortic valve. Aortic valve regurgitation is not visualized. Moderate aortic stenosis is present.  Aortic valve mean gradient measures 32.0 mmHg. Aortic valve peak gradient measures 42.6 mmHg. Aortic valve area, by VTI measures 1.10 cm. Pulmonic Valve: The pulmonic valve was normal in structure. Pulmonic valve regurgitation is not visualized. No evidence of pulmonic stenosis. Aorta: The aortic root is normal in size and structure. Venous: The inferior vena cava is dilated in size with greater than 50% respiratory variability, suggesting right atrial pressure of 8 mmHg. IAS/Shunts: No atrial level shunt detected by color flow Doppler.  LEFT VENTRICLE PLAX 2D LVIDd:         3.70 cm     Diastology LVIDs:         2.40 cm     LV e' medial:    4.57 cm/s LV PW:         0.70 cm     LV E/e' medial:  20.0 LV IVS:        1.80 cm  LV e' lateral:   5.66 cm/s LVOT diam:     2.10 cm     LV E/e' lateral: 16.2 LV SV:         60 LV SV Index:   38 LVOT Area:     3.46 cm  LV Volumes (MOD) LV vol d, MOD A2C: 77.1 ml LV vol d, MOD A4C: 68.4 ml LV vol s, MOD A2C: 21.9 ml LV vol s, MOD A4C: 25.9 ml LV SV MOD A2C:     55.2 ml LV SV MOD A4C:     68.4 ml LV SV MOD BP:      53.0 ml RIGHT VENTRICLE             IVC RV S prime:     14.70 cm/s  IVC diam: 2.10 cm TAPSE (M-mode): 2.1 cm LEFT ATRIUM             Index        RIGHT ATRIUM           Index LA diam:        3.30 cm 2.09 cm/m   RA Area:     10.80 cm LA Vol (A2C):   21.5 ml 13.59 ml/m  RA Volume:   21.60 ml  13.66 ml/m LA Vol (A4C):   40.5 ml 25.61 ml/m LA Biplane Vol: 30.7 ml 19.41 ml/m  AORTIC VALVE  AV Area (Vmax):    1.18 cm AV Area (Vmean):   1.09 cm AV Area (VTI):     1.10 cm AV Vmax:           326.25 cm/s AV Vmean:          232.750 cm/s AV VTI:            0.543 m AV Peak Grad:      42.6 mmHg AV Mean Grad:      32.0 mmHg LVOT Vmax:         111.00 cm/s LVOT Vmean:        73.100 cm/s LVOT VTI:          0.172 m LVOT/AV VTI ratio: 0.32  AORTA Ao Root diam: 3.50 cm Ao Asc diam:  3.45 cm MITRAL VALVE                TRICUSPID VALVE MV Area (PHT): 4.37 cm     TR Peak grad:   20.1 mmHg MV Area VTI:   2.64 cm     TR Vmax:        224.00 cm/s MV Peak grad:  6.2 mmHg MV Mean grad:  3.0 mmHg     SHUNTS MV Vmax:       1.25 m/s     Systemic VTI:  0.17 m MV Vmean:      79.1 cm/s    Systemic Diam: 2.10 cm MV Decel Time: 174 msec MV E velocity: 91.50 cm/s MV A velocity: 113.67 cm/s MV E/A ratio:  0.80 Gaylyn Keas MD Electronically signed by Gaylyn Keas MD Signature Date/Time: 10/27/2023/3:21:35 PM    Final    DG Abdomen 1 View Result Date: 10/27/2023 CLINICAL DATA:  OG tube placement EXAM: ABDOMEN - 1 VIEW COMPARISON:  Oct 26, 2023 FINDINGS: Esophagogastric tube terminates in the stomach. Nonobstructive bowel gas pattern. No pneumoperitoneum. No organomegaly or radiopaque calculi. Multilevel thoracic osteophytosis. Atherosclerosis. IMPRESSION: Well-positioned esophagogastric tube terminates in the stomach. Electronically Signed   By: Rance Burrows M.D.   On: 10/27/2023 12:04   DG Chest  Portable 1 View Result Date: 10/27/2023 CLINICAL DATA:  ETT tube placement EXAM: PORTABLE CHEST - 1 VIEW COMPARISON:  Oct 26, 2023 FINDINGS: Suboptimal evaluation of the endotracheal tube due to overlying cardiac wires. It appears that the tip of the endotracheal tube is within the ostium of the right mainstem bronchus. Left IJ approach central venous catheter terminates at the cavoatrial junction. Esophagogastric tube courses below the diaphragm with the distal tip not included in the field of view. The last side hole overlies the  expected region of the stomach. Trace left pleural effusion. Streaky left basilar atelectasis. No pneumothorax. No cardiomegaly. Tortuous aorta with aortic atherosclerosis. IMPRESSION: Low lying endotracheal tube, which is suboptimally evaluated due to overlying defibrillator pad and cardiac wires. It appears that the tip of the endotracheal tube is within the ostium of the right mainstem bronchus. Repeat chest radiograph recommended for further characterization after 2 cm of retraction. Well-positioned left IJ central venous catheter.  No pneumothorax. Electronically Signed   By: Rance Burrows M.D.   On: 10/27/2023 12:03   CT HEAD WO CONTRAST ( ) Addendum Date: 10/27/2023 ADDENDUM REPORT: 10/27/2023 09:03 ADDENDUM: These results were called by telephone at the time of interpretation on 10/27/2023 at 9:03 am to provider Dr. Nichole Barker, who verbally acknowledged these results. Electronically Signed   By: Bascom Lily D.O.   On: 10/27/2023 09:03   Result Date: 10/27/2023 CLINICAL DATA:  Head trauma, intracranial arterial injury suspected. EXAM: CT HEAD WITHOUT CONTRAST TECHNIQUE: Contiguous axial images were obtained from the base of the skull through the vertex without intravenous contrast. RADIATION DOSE REDUCTION: This exam was performed according to the departmental dose-optimization program which includes automated exposure control, adjustment of the mA and/or kV according to patient size and/or use of iterative reconstruction technique. COMPARISON:  Head CT 10/26/2023. FINDINGS: Brain: Interval increase in size of an acute subdural hematoma along the right cerebral convexity (greatest along the anterior right frontal lobe), now measuring up to 9 mm in thickness (previously 7 mm). Acute subarachnoid hemorrhage scattered along the right cerebral convexity, increased. Hemorrhagic parenchymal contusions within the anterior right temporal lobe and anteroinferior right frontal lobe have increased in size. For  instance, the largest hemorrhagic parenchymal contusion within the anterior right temporal lobe now measures 18 mm (previously 11 mm) (series 2, image 16). Trace acute subarachnoid hemorrhage is present along the anterolateral left temporal lobe, new from the prior exam (series 2, image 15). Possible small underlying left temporal lobe hemorrhagic parenchymal contusions. Thin subdural hemorrhage layering along the tentorium, bilaterally, similar to the prior exam. A 2.3 x 2.6 cm hemorrhagic parenchymal contusion within the left cerebellar hemisphere has increased in size (previously 2.1 x 1.8 cm). Trace subarachnoid hemorrhage questioned within the inferior aspect of the fourth ventricle (versus prominent choroid plexus). No midline shift or hydrocephalus. Vascular: No hyperdense vessel.  Atherosclerotic calcifications. Skull: Non-depressed fracture of the left occipital calvarium again noted. Sinuses/Orbits: No orbital mass or acute orbital finding. Mild mucosal thickening within the right ethmoid and left maxillary sinuses. Other: Left occipital scalp hematoma and laceration. Attempts are being made to reach the ordering provider at this time. IMPRESSION: 1. Interval increase in size of an acute subdural hematoma along the right cerebral convexity (greatest along the anterior right frontal lobe), now measuring up to 9 mm in thickness (previously 7 mm). 2. Acute subarachnoid hemorrhage scattered along the right cerebral convexity, increased. 3. Hemorrhagic parenchymal contusions within the anterior right temporal lobe and anteroinferior right frontal lobe have increased  in size. 4. Trace acute subarachnoid hemorrhage along the anterolateral left temporal lobe, new from the prior exam. Possible small underlying left temporal lobe hemorrhagic parenchymal contusions. 5. A 2.3 x 2.6 cm hemorrhagic parenchymal contusion within the left cerebellar hemisphere has increased in size (previously 2.1 x 1.8 cm). 6. Thin  subdural hemorrhage layering along the tentorium, bilaterally, similar to the prior exam. 7. Trace subarachnoid hemorrhage (versus prominent choroid plexus) within the inferior aspect of the fourth ventricle. 8. Non-depressed fracture of the left occipital calvarium. 9. Left occipital scalp hematoma and laceration. Electronically Signed: By: Bascom Lily D.O. On: 10/27/2023 08:44   CT HEAD WO CONTRAST Result Date: 10/26/2023 CLINICAL DATA:  Fall EXAM: CT HEAD WITHOUT CONTRAST CT CERVICAL SPINE WITHOUT CONTRAST TECHNIQUE: Multidetector CT imaging of the head and cervical spine was performed following the standard protocol without intravenous contrast. Multiplanar CT image reconstructions of the cervical spine were also generated. RADIATION DOSE REDUCTION: This exam was performed according to the departmental dose-optimization program which includes automated exposure control, adjustment of the mA and/or kV according to patient size and/or use of iterative reconstruction technique. COMPARISON:  None Available. FINDINGS: CT HEAD FINDINGS Brain: There is mixed subdural and subarachnoid hemorrhage of the right hemisphere, predominantly anterior. There is a small amount of subdural blood layering along the falx cerebrum. No midline shift or other mass effect. No hydrocephalus. Vascular: Atherosclerotic calcification of the vertebral and internal carotid arteries at the skull base. No abnormal hyperdensity of the major intracranial arteries or dural venous sinuses. Skull: Large left posterior scalp hematoma. Nondisplaced left occipital skull fracture. Sinuses/Orbits: No fluid levels or advanced mucosal thickening of the visualized paranasal sinuses. No mastoid or middle ear effusion. Normal orbits. Other: None. CT CERVICAL SPINE FINDINGS Alignment: Grade 1 anterolisthesis at C4-5. Skull base and vertebrae: No acute fracture. Soft tissues and spinal canal: No prevertebral fluid or swelling. No visible canal hematoma.  Disc levels: No advanced spinal canal or neural foraminal stenosis. Upper chest: No pneumothorax, pulmonary nodule or pleural effusion. Other: Normal visualized paraspinal cervical soft tissues. IMPRESSION: 1. Mixed subdural and subarachnoid hemorrhage of the right hemisphere, predominantly anterior. No midline shift or other mass effect. 2. Nondisplaced left occipital skull fracture with large left posterior scalp hematoma. 3. No acute fracture or static subluxation of the cervical spine. Critical Value/emergent results were called by telephone at the time of interpretation on 10/26/2023 at 11:50 pm to provider JONATHAN GILLIAM , who verbally acknowledged these results. Electronically Signed   By: Juanetta Nordmann M.D.   On: 10/26/2023 23:51   CT CERVICAL SPINE WO CONTRAST Result Date: 10/26/2023 CLINICAL DATA:  Fall EXAM: CT HEAD WITHOUT CONTRAST CT CERVICAL SPINE WITHOUT CONTRAST TECHNIQUE: Multidetector CT imaging of the head and cervical spine was performed following the standard protocol without intravenous contrast. Multiplanar CT image reconstructions of the cervical spine were also generated. RADIATION DOSE REDUCTION: This exam was performed according to the departmental dose-optimization program which includes automated exposure control, adjustment of the mA and/or kV according to patient size and/or use of iterative reconstruction technique. COMPARISON:  None Available. FINDINGS: CT HEAD FINDINGS Brain: There is mixed subdural and subarachnoid hemorrhage of the right hemisphere, predominantly anterior. There is a small amount of subdural blood layering along the falx cerebrum. No midline shift or other mass effect. No hydrocephalus. Vascular: Atherosclerotic calcification of the vertebral and internal carotid arteries at the skull base. No abnormal hyperdensity of the major intracranial arteries or dural venous sinuses. Skull: Large left  posterior scalp hematoma. Nondisplaced left occipital skull fracture.  Sinuses/Orbits: No fluid levels or advanced mucosal thickening of the visualized paranasal sinuses. No mastoid or middle ear effusion. Normal orbits. Other: None. CT CERVICAL SPINE FINDINGS Alignment: Grade 1 anterolisthesis at C4-5. Skull base and vertebrae: No acute fracture. Soft tissues and spinal canal: No prevertebral fluid or swelling. No visible canal hematoma. Disc levels: No advanced spinal canal or neural foraminal stenosis. Upper chest: No pneumothorax, pulmonary nodule or pleural effusion. Other: Normal visualized paraspinal cervical soft tissues. IMPRESSION: 1. Mixed subdural and subarachnoid hemorrhage of the right hemisphere, predominantly anterior. No midline shift or other mass effect. 2. Nondisplaced left occipital skull fracture with large left posterior scalp hematoma. 3. No acute fracture or static subluxation of the cervical spine. Critical Value/emergent results were called by telephone at the time of interpretation on 10/26/2023 at 11:50 pm to provider JONATHAN GILLIAM , who verbally acknowledged these results. Electronically Signed   By: Juanetta Nordmann M.D.   On: 10/26/2023 23:51   CT CHEST ABDOMEN PELVIS W CONTRAST Result Date: 10/26/2023 CLINICAL DATA:  Blunt trauma, loss of balance, fell backwards and hit head, hypertension EXAM: CT CHEST, ABDOMEN, AND PELVIS WITH CONTRAST TECHNIQUE: Multidetector CT imaging of the chest, abdomen and pelvis was performed following the standard protocol during bolus administration of intravenous contrast. RADIATION DOSE REDUCTION: This exam was performed according to the departmental dose-optimization program which includes automated exposure control, adjustment of the mA and/or kV according to patient size and/or use of iterative reconstruction technique. CONTRAST:  75mL OMNIPAQUE  IOHEXOL  350 MG/ML SOLN COMPARISON:  06/16/2016, 06/05/2023 FINDINGS: CT CHEST FINDINGS Cardiovascular: The heart is enlarged without pericardial effusion. No evidence of  thoracic aortic aneurysm or dissection. No evidence of vascular injury. Atherosclerosis of the aorta and coronary vasculature. Mediastinum/Nodes: No enlarged mediastinal, hilar, or axillary lymph nodes. Thyroid  gland, trachea, and esophagus demonstrate no significant findings. Lungs/Pleura: No acute airspace disease, effusion, or pneumothorax. Central airways are patent. Musculoskeletal: There is an acute appearing fracture through the superior endplate of the T10 vertebral body, with less than 10% loss of height. No retropulsion. No other acute bony abnormalities. Chronic appearing compression deformity within the inferior endplate of the T9 vertebral body. Reconstructed images demonstrate no additional findings. CT ABDOMEN PELVIS FINDINGS Hepatobiliary: Nodular contour of the liver consistent with cirrhosis. No focal parenchymal liver abnormality. No evidence of hepatic injury. The gallbladder is unremarkable. No biliary duct dilation. Pancreas: Unremarkable. No pancreatic ductal dilatation or surrounding inflammatory changes. Spleen: The spleen is enlarged, measuring 11.8 x 11.7 x 6.0 cm. No focal parenchymal abnormality. Adrenals/Urinary Tract: No adrenal hemorrhage or renal injury identified. Bladder is unremarkable. Stomach/Bowel: No bowel obstruction or ileus. No bowel wall thickening or inflammatory change. Vascular/Lymphatic: Aortic atherosclerosis. No enlarged abdominal or pelvic lymph nodes. Reproductive: Multiple calcified uterine fibroids. No adnexal masses. Other: No free fluid or free intraperitoneal gas. Small fat containing umbilical hernia. Musculoskeletal: Chronic appearing compression deformities are seen at T12, L2, L3, L4, and L5, with no significant change since the prior exam. Reconstructed images demonstrate no additional findings. IMPRESSION: 1. Acute appearing compression deformity through the superior endplate of the T10 vertebral body, with less than 10% loss of vertebral body height. No  retropulsion. 2. Otherwise no acute intrathoracic, intra-abdominal, or intrapelvic trauma. 3. 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 4. Other chronic thoracolumbar compression deformities as above. 5.  Aortic Atherosclerosis (ICD10-I70.0). 6. Cardiomegaly without pericardial effusion. Electronically Signed   By: Bari Boos.D.  On: 10/26/2023 23:38   DG Chest Port 1 View Result Date: 10/26/2023 CLINICAL DATA:  Fall, chest are EXAM: PORTABLE CHEST 1 VIEW COMPARISON:  None Available. FINDINGS: Lungs are well expanded, symmetric, and clear. No pneumothorax or pleural effusion. Cardiac size within normal limits. Pulmonary vascularity is normal. Osseous structures are age-appropriate. No acute bone abnormality. IMPRESSION: 1. No active disease. Electronically Signed   By: Worthy Heads M.D.   On: 10/26/2023 22:26    EKG: SR PAC LVH nonspecific ST changes    ASSESSMENT AND PLAN:   SVT:  terminated with adenosine . Start oral beta blocker and amiodarone via tube. With AS/LVH not a good candidate for flecainide.  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

## 2023-11-07 NOTE — Evaluation (Addendum)
 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   GERD (gastroesophageal reflux disease)    on meds   Hiatal hernia    Hx of adenomatous polyp of colon 07/07/2009   Hyperlipidemia    diet controlled   Hypertension    on meds - LASIX    IBS (irritable bowel syndrome)    Iron  deficiency anemia    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 Willy Harvest, adenimatous polyps, repeat in 5 yrs   ESOPHAGOGASTRODUODENOSCOPY  07/06/2021   per Dr. Willy Harvest, showed portal hypertensive gastropathy, repeat in 2 yrs   ESOPHAGOGASTRODUODENOSCOPY (EGD) WITH PROPOFOL  N/A 06/20/2017   Procedure: ESOPHAGOGASTRODUODENOSCOPY (EGD) WITH PROPOFOL ;  Surgeon: Kenney Peacemaker, MD;  Location: [facility];  Service: Endoscopy;  Laterality: N/A;  with Barrx   EYE SURGERY     IR IVC FILTER PLMT / S&I /IMG GUID/MOD SED  10/31/2023   NASAL  SEPTOPLASTY W/ TURBINOPLASTY     POLYPECTOMY  2015   CG-TA   TONSILLECTOMY     HPI:  Pt is a 73 y.o. female presenting after fall 5/14. CT with occipital skull fracture, traumatic subarachnoid hemorrhage and right subdural hematoma. Repeat CT increased size of R subdural hematoma. Intubated 5/15-5/23 secondary to respiratory failure secondary to E. coli, Streptococcus HAP. EEG 5/16 with cortical dysfunction arising from R hemisphere, moderate-severe diffuse encephalopathy. EEG 5/17 with seizure activity averaging 4 seizures per hour lasting 1 min each; resolved with adjustment to medications. RLE DVT 5/19 and IVC filter placed. PMH significant for autoimmune hepatitis with cirrhosis of liver, HTN, depression, GERD, HLD, DM, IBS, osteoporosis, thyroid  nodule, vascular disease, vitamin D deficiency.    Assessment / Plan / Recommendation  Clinical Impression  Pt seen for clinical swallowing evaluation. Pt alert, restless, non-verbal. Pulling at lines/Cortrak. Difficult to redirect. Concern for reduced secretion managmeent given congested, non-productive cough and audible, wet respirations. R lateral lean requiring frequent respositioning throughout assessment. Pt unable to follow commands adequately for participation in OME; however, mild flattening of L nasolabial fold aprpeciated. Oral care provided via suction toothbrush. Pt with tonic bite in reponse to oral care and PO trials. Pt with prolonged A-P transit with ice chip trials, suspect lingual weakness/incoordination. Pharyngeally, with immediate and prolonged, wet coughing in response to ice  chip trials. Further trials deferred given severity of pt's clinical presentation. Recommend continuation of NPO with short term alternate route of nutrition/hydration/medication. SLP to f/u per POC for continued PO trials. Pt would benefit from speech/language evaluation given TBI/stroke.  SLP Visit Diagnosis: Dysphagia, oropharyngeal phase (R13.12)     Aspiration Risk  Severe aspiration risk;Risk for inadequate nutrition/hydration    Diet Recommendation NPO;Alternative means - temporary    Medication Administration: Via alternative means    Other  Recommendations Oral Care Recommendations: Oral care QID;Staff/trained caregiver to provide oral care    Recommendations for follow up therapy are one component of a multi-disciplinary discharge planning process, led by the attending physician.  Recommendations may be updated based on patient status, additional functional criteria and insurance authorization.  Follow up Recommendations Acute inpatient rehab (3hours/day)         Functional Status Assessment Patient has had a recent decline in their functional status and demonstrates the ability to make significant improvements in function in a reasonable and predictable amount of time.  Frequency and Duration min 2x/week  2 weeks       Prognosis Prognosis for improved oropharyngeal function: Fair Barriers to Reach Goals: Cognitive deficits;Severity of deficits      Swallow Study   General Date of Onset: 10/26/23 HPI: Pt is a 73 y.o. female presenting after fall 5/14. CT with occipital skull fracture, traumatic subarachnoid hemorrhage and right subdural hematoma. Repeat CT increased size of R subdural hematoma. Intubated 5/15-5/23 secondary to respiratory failure secondary to E. coli, Streptococcus HAP. EEG 5/16 with cortical dysfunction arising from R hemisphere, moderate-severe diffuse encephalopathy. EEG 5/17 with seizure activity averaging 4 seizures per hour lasting 1 min each; resolved with adjustment to medications. RLE DVT 5/19 and IVC filter placed. PMH significant for autoimmune hepatitis with cirrhosis of liver, HTN, depression, GERD, HLD, DM, IBS, osteoporosis, thyroid  nodule, vascular disease, vitamin D deficiency. Type of Study: Bedside Swallow Evaluation Previous Swallow Assessment: none Diet Prior to this Study:  Cortrak/Small bore NG tube Temperature Spikes Noted: Yes Respiratory Status: Nasal cannula (4L/min) History of Recent Intubation: Yes Total duration of intubation (days): 9 days Date extubated: 11/04/23 Behavior/Cognition: Alert;Distractible;Requires cueing;Impulsive;Doesn't follow directions Oral Cavity Assessment: Within Functional Limits Oral Care Completed by SLP: Yes Oral Cavity - Dentition: Adequate natural dentition Vision: Functional for self-feeding Self-Feeding Abilities: Total assist Patient Positioning: Upright in bed (R lateral lean requiring frequent repositioning) Baseline Vocal Quality: Not observed Volitional Cough: Weak;Congested Volitional Swallow: Unable to elicit    Oral/Motor/Sensory Function Overall Oral Motor/Sensory Function:  (UTA fully; mild flattening of L nasolabial fold appreciated)   Ice Chips Ice chips: Impaired Presentation: Spoon Oral Phase Impairments: Reduced lingual movement/coordination (WFL) Oral Phase Functional Implications: Prolonged oral transit Pharyngeal Phase Impairments: Cough - Immediate;Wet Vocal Quality (prolonged;)   Thin Liquid Thin Liquid: Not tested    Nectar Thick Nectar Thick Liquid: Not tested   Honey Thick Honey Thick Liquid: Not tested   Puree Puree: Not tested   Solid     Solid: Not tested     Dia Forget, M.S., CCC-SLP Speech-Language Pathologist Secure Chat Preferred  O: [phone]  Leory Rands Bernis Stecher 11/07/2023,10:04 AM

## 2023-11-07 NOTE — Progress Notes (Addendum)
 PROGRESS NOTE                                                                                                                                                                                                             Patient Demographics:    Miranda Cochran, is a 73 y.o. 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, chronically on prednisone , hypertension, depression, DM type II, GAVE syndrome causing GI bleed, GERD, hiatal hernia, thyroid  nodule, PAD who presented to the hospital on 10/26/2023 after multiple falls at home and head injuries, she developed decrease in her mental status while in the hospital on 10/27/2023 the next day of admission, head CT was consistent with TBI with traumatic subdural hematoma, subarachnoid hemorrhage and right temporal contusion MRI also showed small acute infarcts in bilateral cerebral hemispheres right more than left, she was subsequently seen by the PCCM team, she was intubated, she was also found to have seizures subsequently for which she received Keppra .  She developed expressive aphasia and dysphagia for which she is on NG tube feeds, finally she was extubated on 11/04/2023 and transferred to my service on 11/07/2023.  Of note she is still extremely weak and frail, minimally responsive, dense left-sided hemiparesis, still has NG tube and Flexi-Seal.  Has been seen by [facility], neurology and palliative care.   Significant Hospital Events: Including procedures, antibiotic start and stop dates in addition to other pertinent events   5/14 admit 5/15 AMS, CT head consistent with TBI with subdural hematoma, subarachnoid  hemorrhage, right temporal contusion along with MRI suggestive of bilateral acute infarcts left more than right. 5/16 Remains on minimal vent settings, hypoglycemic episode this AM, originally following commands overnight. Repeat CT Scan shows mild worsening of bleeding 5/17 Seizures on EEG.  Given additional dose of Keppra , Keppra  dose increased and loaded with phenobarb. CT head with stable extensive multifocal posttraumatic ICH, no MLS, basilar cisterns remain patent, stable nondisplaced left posterior skull fx 5/18 EEG neg. Sedation off, tolerating SBT 8/5 but  not waking up yet 5/19 LE duplex with R DVT, had IVC filter placed as AC not cleared by neurosurgery. CTA chest neg for PE but showed LLL PNA 5/22 On precedex, tolerating SBT, no further sz EEG 5/23 Extubated  5/24 Weak cough with tachypnea, at risk for inability to protect airway  5/26.  Transferred to [facility] under my care on day 11 of her hospital stay, currently has NG tube, unresponsive, Flexi-Seal    Subjective:    Miranda Cochran today in bed, unable to answer questions or follow commands. Appears to be in no distress.   Assessment  & Plan :   Severe acute encephalopathy, left-sided hemiparesis, dysphagia, expressive aphasia, caused by multiple falls at home causing traumatic brain injury, subdural hematoma, subarachnoid hematoma, right temporal contusion, MRI also suggestive of bilateral infarcts left cerebral hemisphere more than right.  Complicated by seizures.  - Still has left-sided weakness, expressive aphasia and severe dysphagia, on NG tube feeds, extremely weak and frail, continue Keppra  for seizures, aspirin  for infarcts, seen by neurology, continue supportive care, may require PEG tube, may require placement long-term.  Neurology has requested Zio patch evaluation after discharge, with outpatient neurology follow-up, question how it would change management since she is not a candidate for anticoagulation due to her multiple  falls and traumatic brain injury related subdural and subarachnoid hemorrhages.       Acute hypoxic respiratory failure due to hospital-acquired pneumonia - sputum culture 5/17 with E.coli and Strep pneumoniae -Able to be successfully extubated 5/24 but remains high risk for respiratory decompensation given poor cough and tachypnea -S/p 7 days antibiotics last dose 11/04/2023. -Continue to monitor closely, still has NG tube, still has severe encephalopathy with dysphagia and expressive aphasia, repeat aspiration risk very high.    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 Not cleared for anticoagulation by neurosurgery. CTA neg for PE. P: IVC filter remains in place Risk of anticoagulation outweighs benefit given chronic falls    SVT run 11/07/23 - broke with adenosine , scheduled Lopressor, IVF, EKG, TTE, TSH stable, cards input.   HX of alcohol abuse.  No DTs.    Hyperglycemia P: SSI CBG goal 140-180 CBG checks every 4  Lab Results  Component Value Date   HGBA1C 6.8 (H) 10/27/2023   CBG (last 3)  Recent Labs    11/07/23 0037 11/07/23 0312 11/07/23 0821  GLUCAP 210* 224* 123*         Condition - Extremely Guarded  Family Communication  :   Husband Chet - [phone] on 11/07/2023 at 10 AM and message left, called again 02/10/1929 a.m.  No response.  Code Status : Full code  Consults  : Neurology, NS - Dr Ellery Guthrie, PCCM, Cards, palliative care  PUD Prophylaxis : PPI   Procedures  :            Disposition Plan  :    Status is: Inpatient  DVT Prophylaxis  :    Place and maintain sequential compression device Start: 10/27/23 1110 SCDs Start: 10/27/23 0329    Lab Results  Component Value Date   PLT 226 11/07/2023    Diet :  Diet Order             Diet NPO time specified  Diet effective now                    Inpatient Medications  Scheduled Meds:  aspirin   81 mg Per Tube Daily   Or   aspirin   300 mg Rectal Daily   Chlorhexidine  Gluconate Cloth  6 each Topical Daily   cyanocobalamin   500 mcg Per Tube Daily   ezetimibe   10 mg Per Tube Daily   feeding supplement (PROSource TF20)  60 mL Per Tube Daily   fiber supplement (BANATROL TF)  60 mL Per Tube BID   insulin  aspart  0-20 Units Subcutaneous Q4H   insulin  aspart  8 Units Subcutaneous Q4H   insulin  glargine-yfgn  25 Units Subcutaneous Daily   levETIRAcetam   1,000 mg Per Tube BID   multivitamin with minerals  1 tablet Per Tube Daily   mouth rinse  15 mL Mouth Rinse 4 times per day   pantoprazole  (PROTONIX ) IV  40 mg Intravenous QHS   predniSONE   10 mg Per Tube Q breakfast   thiamine   100 mg Per Tube Daily   Continuous Infusions:  feeding supplement (OSMOLITE 1.5 CAL) 1,000 mL (11/06/23 1653)   PRN Meds:.acetaminophen  (TYLENOL ) oral liquid 160 mg/5 mL, hydrALAZINE , mouth rinse  Antibiotics  :    Anti-infectives (From admission, onward)    Start     Dose/Rate Route Frequency Ordered Stop   11/01/23 1400  cefTRIAXone  (ROCEPHIN ) 2 g in sodium chloride  0.9 % 100 mL IVPB        2 g 200 mL/hr over 30 Minutes Intravenous Every 24 hours 11/01/23 1025 11/04/23 1511   10/29/23 0845  Ampicillin -Sulbactam (UNASYN ) 3 g in sodium chloride  0.9 % 100 mL IVPB  Status:  Discontinued        3 g 200 mL/hr over 30 Minutes Intravenous Every 6 hours 10/29/23 0753 11/01/23 1025         Objective:   Vitals:   11/06/23 2335 11/07/23 0311 11/07/23 0500 11/07/23 0757  BP: 130/69 135/67    Pulse: (!) 109 (!) 109    Resp: (!) 37 (!) 30  18  Temp: 100 F (37.8 C) 99.8 F (37.7 C)  98 F (36.7 C)  TempSrc: Oral Oral  Axillary  SpO2: 99% 98%    Weight:   56.5 kg   Height:        Wt Readings from Last 3 Encounters:  11/07/23 56.5 kg  08/29/23 56.7 kg  04/22/23 61.1 kg     Intake/Output Summary (Last 24 hours) at 11/07/2023 1019 Last data filed at 11/07/2023 0032 Gross per 24 hour   Intake 180 ml  Output 1550 ml  Net -1370 ml     Physical Exam  Awake Alert, No new F.N deficits, Normal affect [place].AT,PERRAL Supple Neck, No JVD,   Symmetrical Chest wall movement, Good air movement bilaterally, CTAB RRR,No Gallops,Rubs or new Murmurs,  +ve B.Sounds, Abd Soft, No tenderness,   No Cyanosis, Clubbing or edema       Data Review:    Recent Labs  Lab 11/03/23 0529 11/04/23 0710 11/04/23 1527 11/05/23 0603 11/06/23 0506 11/07/23 0455  WBC 7.4 8.1  --  15.6* 12.3* 9.2  HGB 8.9* 9.4* 9.5* 11.6* 10.2* 10.5*  HCT 28.1* 29.9* 28.0* 36.0 32.0* 32.9*  PLT 148* 155  --  225 231 226  MCV 100.7* 102.7*  --  98.6 99.4 101.5*  MCH 31.9 32.3  --  31.8 31.7 32.4  MCHC 31.7 31.4  --  32.2 31.9 31.9  RDW 14.3 14.4  --  14.6 14.8 14.9  LYMPHSABS  --   --   --   --   --  1.6  MONOABS  --   --   --   --   --  0.7  EOSABS  --   --   --   --   --  0.3  BASOSABS  --   --   --   --   --  0.1    Recent Labs  Lab 11/01/23 0545 11/02/23 0616 11/03/23 0529 11/04/23 0710 11/04/23 1527 11/05/23 0603 11/06/23 0506 11/07/23 0455  NA 141   < > 139 134* 132* 134* 135 136  K 3.3*   < > 3.6 4.0 4.1 4.1 3.4* 4.2  CL 108   < > 108 103  --  100 105 103  CO2 23   < > 23 21*  --  20* 21* 19*  ANIONGAP 10   < > 8 10  --  14 9 14   GLUCOSE 269*   < > 166* 213*  --  173* 237* 139*  BUN 11   < > 18 17  --  15 19 20   CREATININE 0.39*   < > 0.45 0.47  --  0.51 0.54 0.45  AST 30  --   --   --   --   --   --   --   ALT 20  --   --   --   --   --   --   --   ALKPHOS 104  --   --   --   --   --   --   --   BILITOT 0.6  --   --   --   --   --   --   --   ALBUMIN 2.1*  --   --   --   --   --   --   --   TSH  --   --   --   --   --   --   --  4.013  MG 2.0   < > 2.0 2.0  --  2.0 2.0 2.0  PHOS 1.6*   < > 2.9 3.1  --  3.3 3.1 3.4  CALCIUM  7.5*   < > 7.9* 8.3*  --  8.3* 8.3* 8.7*   < > = values in this interval not displayed.      Recent Labs  Lab 11/03/23 0529 11/04/23 0710  11/05/23 0603 11/06/23 0506 11/07/23 0455  TSH  --   --   --   --  4.013  MG 2.0 2.0 2.0 2.0 2.0  CALCIUM  7.9* 8.3* 8.3* 8.3* 8.7*    --------------------------------------------------------------------------------------------------------------- Lab Results  Component Value Date   CHOL 182 11/05/2023   HDL 39 (L) 11/05/2023   LDLCALC 119 (H) 11/05/2023   LDLDIRECT 133.0 09/15/2021   TRIG 119 11/05/2023   CHOLHDL 4.7 11/05/2023    Lab Results  Component Value Date   HGBA1C 6.8 (H) 10/27/2023   Recent Labs    11/07/23 0455  TSH 4.013  FREET4 0.95    Radiology Report DG Chest Port 1 View Result Date: 11/07/2023 CLINICAL DATA:  Shortness of breath. EXAM: PORTABLE CHEST 1 VIEW COMPARISON:  10/31/2023 FINDINGS: Interval extubation. Left IJ central line has been removed in the interval. A feeding tube passes into the stomach although the distal tip position is not included on the film. The cardio pericardial silhouette is enlarged. The lungs are clear without focal pneumonia, edema, pneumothorax or pleural effusion. Bones are diffusely demineralized. Telemetry leads overlie the chest.  IMPRESSION: 1. Interval extubation and removal of left IJ central line. 2. No acute cardiopulmonary findings. Electronically Signed   By: Donnal Fusi M.D.   On: 11/07/2023 07:17     Signature  -   Lynnwood Sauer M.D on 11/07/2023 at 10:19 AM   -  To page go to [email or web address]

## 2023-11-07 NOTE — Progress Notes (Signed)
 Echocardiogram 2D Echocardiogram has been performed.  Miranda Cochran 11/07/2023, 1:28 PM

## 2023-11-07 NOTE — Progress Notes (Signed)
 Palliative:  ***  Yong Channel, NP Palliative Medicine Team Pager [phone] (Please see [email or web address] for schedule) Team Phone [phone]

## 2023-11-07 NOTE — Plan of Care (Signed)
  Problem: Education: Goal: Knowledge of the prescribed therapeutic regimen Outcome: Progressing Goal: Knowledge of disease or condition will improve Outcome: Progressing   Problem: Clinical Measurements: Goal: Neurologic status will improve Outcome: Progressing   Problem: Tissue Perfusion: Goal: Ability to maintain intracranial pressure will improve Outcome: Progressing   Problem: Respiratory: Goal: Will regain and/or maintain adequate ventilation Outcome: Progressing   Problem: Skin Integrity: Goal: Risk for impaired skin integrity will decrease Outcome: Progressing Goal: Demonstration of wound healing without infection will improve Outcome: Progressing   Problem: Psychosocial: Goal: Ability to verbalize positive feelings about self will improve Outcome: Progressing Goal: Ability to participate in self-care as condition permits will improve Outcome: Progressing Goal: Ability to identify appropriate support needs will improve Outcome: Progressing   Problem: Health Behavior/Discharge Planning: Goal: Ability to manage health-related needs will improve Outcome: Progressing   Problem: Nutritional: Goal: Risk of aspiration will decrease Outcome: Progressing Goal: Dietary intake will improve Outcome: Progressing   Problem: Communication: Goal: Ability to communicate needs accurately will improve Outcome: Progressing   Problem: Education: Goal: Knowledge of disease or condition will improve Outcome: Progressing Goal: Understanding of discharge needs will improve Outcome: Progressing   Problem: Health Behavior/Discharge Planning: Goal: Ability to identify changes in lifestyle to reduce recurrence of condition will improve Outcome: Progressing Goal: Identification of resources available to assist in meeting health care needs will improve Outcome: Progressing   Problem: Physical Regulation: Goal: Complications related to the disease process, condition or treatment  will be avoided or minimized Outcome: Progressing   Problem: Safety: Goal: Ability to remain free from injury will improve Outcome: Progressing   Problem: Education: Goal: Understanding of CV disease, CV risk reduction, and recovery process will improve Outcome: Progressing Goal: Individualized Educational Video(s) Outcome: Progressing   Problem: Activity: Goal: Ability to return to baseline activity level will improve Outcome: Progressing   Problem: Cardiovascular: Goal: Ability to achieve and maintain adequate cardiovascular perfusion will improve Outcome: Progressing Goal: Vascular access site(s) Level 0-1 will be maintained Outcome: Progressing   Problem: Health Behavior/Discharge Planning: Goal: Ability to safely manage health-related needs after discharge will improve Outcome: Progressing   Problem: Education: Goal: Knowledge of disease or condition will improve Outcome: Progressing Goal: Knowledge of secondary prevention will improve (MUST DOCUMENT ALL) Outcome: Progressing Goal: Knowledge of patient specific risk factors will improve (DELETE if not current risk factor) Outcome: Progressing   Problem: Ischemic Stroke/TIA Tissue Perfusion: Goal: Complications of ischemic stroke/TIA will be minimized Outcome: Progressing   Problem: Coping: Goal: Will verbalize positive feelings about self Outcome: Progressing Goal: Will identify appropriate support needs Outcome: Progressing   Problem: Health Behavior/Discharge Planning: Goal: Ability to manage health-related needs will improve Outcome: Progressing Goal: Goals will be collaboratively established with patient/family Outcome: Progressing   Problem: Self-Care: Goal: Ability to participate in self-care as condition permits will improve Outcome: Progressing Goal: Verbalization of feelings and concerns over difficulty with self-care will improve Outcome: Progressing Goal: Ability to communicate needs accurately  will improve Outcome: Progressing   Problem: Nutrition: Goal: Risk of aspiration will decrease Outcome: Progressing Goal: Dietary intake will improve Outcome: Progressing

## 2023-11-07 NOTE — Significant Event (Signed)
 Rapid Response Event Note   Reason for Call :  SVT 170s BP 85/66  Initial Focused Assessment:  Patient is alert Congested cough,  she is nonverbal and slow to respond to commands.  Rapid RR, but no increased work of breathing  BP 104/69  HR 174  O2 RR 30  O2 sat 98 on 2L [place]   Interventions:  6mg  Adenosine  IV push BP 119/67 HR 109  Plan of Care:  Cardiology Consult RN to call if sustained rapid HR   Event Summary:   MD Notified: Dr Hilton Lucky at bedside Call Time: 1121 Arrival Time: 1125 End Time: 1200  Waldemar Guillaume, RN

## 2023-11-07 NOTE — Progress Notes (Signed)
 Physical Therapy Treatment Patient Details Name: Miranda Cochran MRN: [id number] DOB: [birth year] Today's Date: 11/07/2023   History of Present Illness Pt is a 73 y.o. female presenting after fall 5/14. CT with occipital skull fracture, traumatic subarachnoid hemorrhage and right subdural hematoma. Repeat CT increased size of R subdural hematoma. Intubated 5/15-5/23 secondary to respiratory failure secondary to E. coli, Streptococcus HAP. EEG 5/16 with cortical dysfunction arising from R hemisphere, moderate-severe diffuse encephalopathy. EEG 5/17 with seizure activity averaging 4 seizures per hour lasting 1 min each; resolved with adjustment to medications. RLE DVT 5/19 and IVC filter placed. PMH significant for autoimmune hepatitis with cirrhosis of liver, HTN, depression, GERD, HLD, DM, IBS, osteoporosis, thyroid  nodule, vascular disease, vitamin D deficiency.    PT Comments  Pt tolerates treatment well, demonstrating improved command following and is able to progress to continue transfer training. Pt demonstrates activation of LLE, performing a LAQ in sitting and multiple LE exercises in supine. Pt remains at a high risk for falls due to poor core activation in sitting and L hemiplegia. Pt will benefit from continued frequent mobilization in an effort to improve strength and balance. Patient will benefit from intensive inpatient follow-up therapy, >3 hours/day.   If plan is discharge home, recommend the following: Two people to help with walking and/or transfers;Two people to help with bathing/dressing/bathroom;Assistance with feeding;Direct supervision/assist for medications management;Direct supervision/assist for financial management;Assist for transportation;Help with stairs or ramp for entrance;Supervision due to cognitive status   Can travel by private vehicle        Equipment Recommendations  Wheelchair (measurements PT);Wheelchair cushion (measurements PT);Hospital bed;Hoyer lift     Recommendations for Other Services       Precautions / Restrictions Precautions Precautions: Fall Recall of Precautions/Restrictions: Impaired Precaution/Restrictions Comments: cortrak Restrictions Weight Bearing Restrictions Per Provider Order: No     Mobility  Bed Mobility Overal bed mobility: Needs Assistance Bed Mobility: Rolling, Sit to Supine, Sidelying to Sit Rolling: Max assist, Total assist (maxA to L side, total to R side) Sidelying to sit: Max assist, HOB elevated   Sit to supine: Total assist   General bed mobility comments: pt initiates mobility of RLE and RUE, utilizing RUE when rolling. Pt requires totalA for LLE and LUE when performing bed mobility.    Transfers Overall transfer level: Needs assistance Equipment used: 1 person hand held assist Transfers: Sit to/from Stand Sit to Stand: Max assist           General transfer comment: pt stands twice with R knee block and PT physical assistance at trunk. Pt is able to activate hip and trunk extensors with verbal cueing, LLE AROM noted with transfer attempts    Ambulation/Gait                   Stairs             Wheelchair Mobility     Tilt Bed    Modified Rankin (Stroke Patients Only) Modified Rankin (Stroke Patients Only) Pre-Morbid Rankin Score: No symptoms Modified Rankin: Severe disability     Balance Overall balance assessment: Needs assistance Sitting-balance support: Single extremity supported, Bilateral upper extremity supported, Feet supported Sitting balance-Leahy Scale: Poor Sitting balance - Comments: modA with brief periods of CGA Postural control: Posterior lean, Right lateral lean, Left lateral lean Standing balance support: Bilateral upper extremity supported Standing balance-Leahy Scale: Poor Standing balance comment: maxA  Communication Communication Communication: Impaired Factors Affecting Communication:  Difficulty expressing self  Cognition Arousal: Alert Behavior During Therapy: Flat affect   PT - Cognitive impairments: Difficult to assess Difficult to assess due to: Impaired communication                     PT - Cognition Comments: expressive aphasia noted, pt does appear to follow >75% of simple one step commands with increased time Following commands: Impaired Following commands impaired: Follows one step commands with increased time, Follows multi-step commands inconsistently    Cueing Cueing Techniques: Verbal cues, Gestural cues, Tactile cues, Visual cues  Exercises General Exercises - Lower Extremity Ankle Circles/Pumps: AROM, Left, 5 reps Quad Sets: AROM, Left, 5 reps    General Comments General comments (skin integrity, edema, etc.): pt on 4L [place], SpO2 stable in high 90s, RR in high 20s to mid 30s for majority of session. Pt incontinent of stool upon PT arrival, nurse tech assists PT in hygiene tasks      Pertinent Vitals/Pain Pain Assessment Pain Assessment: No/denies pain    Home Living                          Prior Function            PT Goals (current goals can now be found in the care plan section) Acute Rehab PT Goals Patient Stated Goal: return to independence Progress towards PT goals: Progressing toward goals    Frequency    Min 3X/week      PT Plan      Co-evaluation              AM-PAC PT "6 Clicks" Mobility   Outcome Measure  Help needed turning from your back to your side while in a flat bed without using bedrails?: A Lot Help needed moving from lying on your back to sitting on the side of a flat bed without using bedrails?: A Lot Help needed moving to and from a bed to a chair (including a wheelchair)?: Total Help needed standing up from a chair using your arms (e.g., wheelchair or bedside chair)?: A Lot Help needed to walk in hospital room?: Total Help needed climbing 3-5 steps with a railing? : Total 6  Click Score: 9    End of Session Equipment Utilized During Treatment: Gait belt;Oxygen Activity Tolerance: Patient tolerated treatment well Patient left: in bed;with call bell/phone within reach;with bed alarm set Nurse Communication: Mobility status;Need for lift equipment PT Visit Diagnosis: Unsteadiness on feet (R26.81);Other abnormalities of gait and mobility (R26.89);Muscle weakness (generalized) (M62.81);History of falling (Z91.81);Pain;Hemiplegia and hemiparesis Hemiplegia - Right/Left: Left Hemiplegia - dominant/non-dominant: Non-dominant Hemiplegia - caused by:  (traumatic SDH)     Time: 4098-1191 PT Time Calculation (min) (ACUTE ONLY): 41 min  Charges:    $Therapeutic Exercise: 8-22 mins $Therapeutic Activity: 23-37 mins PT General Charges $$ ACUTE PT VISIT: 1 Visit                     Rexie Catena, PT, DPT Acute Rehabilitation Office [phone]    Rexie Catena 11/07/2023, 5:04 PM

## 2023-11-08 ENCOUNTER — Other Ambulatory Visit: Payer: Self-pay

## 2023-11-08 DIAGNOSIS — I471 Supraventricular tachycardia, unspecified: Secondary | ICD-10-CM

## 2023-11-08 DIAGNOSIS — I350 Nonrheumatic aortic (valve) stenosis: Secondary | ICD-10-CM

## 2023-11-08 DIAGNOSIS — S065XAA Traumatic subdural hemorrhage with loss of consciousness status unknown, initial encounter: Secondary | ICD-10-CM | POA: Diagnosis not present

## 2023-11-08 LAB — CBC WITH DIFFERENTIAL/PLATELET
Abs Immature Granulocytes: 0.05 10*3/uL (ref 0.00–0.07)
Basophils Absolute: 0 10*3/uL (ref 0.0–0.1)
Basophils Relative: 1 %
Eosinophils Absolute: 0.3 10*3/uL (ref 0.0–0.5)
Eosinophils Relative: 4 %
HCT: 29.7 % — ABNORMAL LOW (ref 36.0–46.0)
Hemoglobin: 9.4 g/dL — ABNORMAL LOW (ref 12.0–15.0)
Immature Granulocytes: 1 %
Lymphocytes Relative: 21 %
Lymphs Abs: 1.6 10*3/uL (ref 0.7–4.0)
MCH: 31.9 pg (ref 26.0–34.0)
MCHC: 31.6 g/dL (ref 30.0–36.0)
MCV: 100.7 fL — ABNORMAL HIGH (ref 80.0–100.0)
Monocytes Absolute: 0.7 10*3/uL (ref 0.1–1.0)
Monocytes Relative: 9 %
Neutro Abs: 4.8 10*3/uL (ref 1.7–7.7)
Neutrophils Relative %: 64 %
Platelets: 227 10*3/uL (ref 150–400)
RBC: 2.95 MIL/uL — ABNORMAL LOW (ref 3.87–5.11)
RDW: 14.7 % (ref 11.5–15.5)
WBC: 7.5 10*3/uL (ref 4.0–10.5)
nRBC: 0 % (ref 0.0–0.2)

## 2023-11-08 LAB — GLUCOSE, CAPILLARY
Glucose-Capillary: 173 mg/dL — ABNORMAL HIGH (ref 70–99)
Glucose-Capillary: 149 mg/dL — ABNORMAL HIGH (ref 70–99)
Glucose-Capillary: 212 mg/dL — ABNORMAL HIGH (ref 70–99)
Glucose-Capillary: 199 mg/dL — ABNORMAL HIGH (ref 70–99)
Glucose-Capillary: 169 mg/dL — ABNORMAL HIGH (ref 70–99)
Glucose-Capillary: 149 mg/dL — ABNORMAL HIGH (ref 70–99)

## 2023-11-08 LAB — MAGNESIUM: Magnesium: 2 mg/dL (ref 1.7–2.4)

## 2023-11-08 LAB — COMPREHENSIVE METABOLIC PANEL WITH GFR
ALT: 62 U/L — ABNORMAL HIGH (ref 0–44)
AST: 79 U/L — ABNORMAL HIGH (ref 15–41)
Albumin: 2.4 g/dL — ABNORMAL LOW (ref 3.5–5.0)
Alkaline Phosphatase: 224 U/L — ABNORMAL HIGH (ref 38–126)
Anion gap: 9 (ref 5–15)
BUN: 15 mg/dL (ref 8–23)
CO2: 19 mmol/L — ABNORMAL LOW (ref 22–32)
Calcium: 7.9 mg/dL — ABNORMAL LOW (ref 8.9–10.3)
Chloride: 107 mmol/L (ref 98–111)
Creatinine, Ser: 0.48 mg/dL (ref 0.44–1.00)
GFR, Estimated: >60 mL/min (ref >60)
Glucose, Bld: 201 mg/dL — ABNORMAL HIGH (ref 70–99)
Potassium: 3.8 mmol/L (ref 3.5–5.1)
Sodium: 135 mmol/L (ref 135–145)
Total Bilirubin: 0.7 mg/dL (ref 0.0–1.2)
Total Protein: 6 g/dL — ABNORMAL LOW (ref 6.5–8.1)

## 2023-11-08 LAB — BRAIN NATRIURETIC PEPTIDE: B Natriuretic Peptide: 146.8 pg/mL — ABNORMAL HIGH (ref 0.0–100.0)

## 2023-11-08 LAB — PHOSPHORUS: Phosphorus: 4 mg/dL (ref 2.5–4.6)

## 2023-11-08 MED ORDER — LACTULOSE 10 GM/15ML PO SOLN
30.0000 g | Freq: Two times a day (BID) | ORAL | Status: DC
  Administered 2023-11-08 – 2023-11-09 (×3): 30 g
  Filled 2023-11-08 (×3): qty 60

## 2023-11-08 MED ORDER — TRAMADOL HCL 50 MG PO TABS
50.0000 mg | ORAL_TABLET | Freq: Two times a day (BID) | ORAL | Status: DC | PRN
  Administered 2023-11-08 – 2023-11-10 (×3): 50 mg via NASOGASTRIC
  Filled 2023-11-08 (×3): qty 1

## 2023-11-08 MED ORDER — FREE WATER
200.0000 mL | Freq: Three times a day (TID) | Status: DC
  Administered 2023-11-08 – 2023-11-11 (×10): 200 mL

## 2023-11-08 NOTE — Progress Notes (Signed)
 PROGRESS NOTE                                                                                                                                                                                                             Patient Demographics:    Miranda Cochran, is a 73 y.o. 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, chronically on prednisone , hypertension, depression, DM type II, GAVE syndrome causing GI bleed, GERD, hiatal hernia, thyroid  nodule, PAD who presented to the hospital on 10/26/2023 after multiple falls at home and head injuries, she developed decrease in her mental status while in the hospital on 10/27/2023 the next day of admission, head CT was consistent with TBI with traumatic subdural hematoma, subarachnoid hemorrhage and right temporal contusion MRI also showed small acute infarcts in bilateral cerebral hemispheres right more than left, she was subsequently seen by the PCCM team, she was intubated, she was also found to have seizures subsequently for which she received Keppra .  She developed expressive aphasia and dysphagia for which she is on NG tube feeds, finally she was extubated on 11/04/2023 and transferred to my service on 11/07/2023.  Of note she is still extremely weak and frail, minimally responsive, dense left-sided hemiparesis, still has NG tube and Flexi-Seal.  Has been seen by [facility], neurology and palliative care.   Significant Hospital Events: Including procedures, antibiotic start and stop dates in addition to other pertinent events   5/14 admit 5/15 AMS, CT head consistent with TBI with subdural hematoma, subarachnoid  hemorrhage, right temporal contusion along with MRI suggestive of bilateral acute infarcts left more than right. 5/16 Remains on minimal vent settings, hypoglycemic episode this AM, originally following commands overnight. Repeat CT Scan shows mild worsening of bleeding 5/17 Seizures on EEG.  Given additional dose of Keppra , Keppra  dose increased and loaded with phenobarb. CT head with stable extensive multifocal posttraumatic ICH, no MLS, basilar cisterns remain patent, stable nondisplaced left posterior skull fx 5/18 EEG neg. Sedation off, tolerating SBT 8/5 but  not waking up yet 5/19 LE duplex with R DVT, had IVC filter placed as AC not cleared by neurosurgery. CTA chest neg for PE but showed LLL PNA 5/22 On precedex, tolerating SBT, no further sz EEG 5/23 Extubated  5/24 Weak cough with tachypnea, at risk for inability to protect airway  5/26.  Transferred to [facility] under my care on day 11 of her hospital stay, currently has NG tube, unresponsive, Flexi-Seal 5/27 - TTE -  1. Left ventricular ejection fraction, by estimation, is 60 to 65%. The left ventricle has normal function. The left ventricle has no regional wall motion abnormalities. There is moderate concentric left ventricular hypertrophy. Left ventricular diastolic parameters are consistent with Grade I diastolic dysfunction (impaired relaxation).  2. Right ventricular systolic function is normal. The right ventricular size is normal. There is normal pulmonary artery systolic pressure. The estimated right ventricular systolic pressure is 25.3 mmHg.  3. The mitral valve is normal in structure. Trivial mitral valve regurgitation. No evidence of mitral stenosis.  4. The aortic valve is tricuspid. There is severe calcifcation of the aortic valve. Aortic valve regurgitation is not visualized. Moderate to severe aortic valve stenosis. Aortic valve mean gradient measures 26.0 mmHg, AVA 0.96 cm^2.  5. The inferior vena cava is normal in size with greater  than 50% respiratory variability, suggesting right atrial pressure of 3 mmHg.     Subjective:   Patient in bed, appears comfortable, denies any headache, no fever, no chest pain or pressure, no shortness of breath , no abdominal pain. No new focal weakness.   Assessment  & Plan :   Severe acute encephalopathy, left-sided hemiparesis, dysphagia, expressive aphasia, caused by multiple falls at home causing traumatic brain injury, subdural hematoma, subarachnoid hematoma, right temporal contusion, MRI also suggestive of bilateral infarcts left cerebral hemisphere more than right.  Complicated by seizures.  - Still has mild left-sided weakness, expressive aphasia and  dysphagia, on NG tube feeds, extremely weak and frail, continue Keppra  for seizures, aspirin  for infarcts, seen by neurology, continue supportive care, may require PEG tube if dysphagia does not improve, may require placement long-term.  Continue PT, OT and speech eval.  Clinically marginally improved on 11/08/2023.  Neurology has requested Zio patch evaluation after discharge, with outpatient neurology follow-up, question how it would change management since she is not a candidate for anticoagulation due to her multiple falls and traumatic brain injury related subdural and subarachnoid hemorrhages.       Acute hypoxic respiratory failure due to hospital-acquired pneumonia - sputum culture 5/17 with E.coli and Strep pneumoniae -Able to be successfully extubated 5/24 but remains high risk for respiratory decompensation given poor cough and tachypnea -S/p 7 days antibiotics last dose 11/04/2023. -Continue to monitor closely, still has NG tube, still has severe encephalopathy with dysphagia and expressive aphasia, repeat aspiration risk very high.    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 Not cleared for anticoagulation by neurosurgery. CTA neg for  PE. P: IVC filter remains in place, Risk of anticoagulation outweighs benefit given chronic falls    SVT run 11/07/23 - broke with adenosine , scheduled Lopressor, IVF, EKG, TTE noted, TSH stable, cards input appreciated now on B Blocker + Amiodarone .  H/O AS moderate to severe - per Cards, avoid preload drops  HX of alcohol abuse ??.  No DTs.    Hyperglycemia P: SSI CBG goal 140-180 CBG checks every 4  Lab Results  Component Value Date   HGBA1C 6.8 (H) 10/27/2023   CBG (last 3)  Recent Labs    11/07/23 1946 11/07/23 2330 11/08/23 0349  GLUCAP 131* 125* 173*         Condition - Extremely Guarded  Family Communication  :   Husband Chet - [phone] on 11/07/2023 at 10 AM and message left, called again 02/10/1929 a.m.  No response, updated 11/08/23  Code Status : Full code  Consults  : Neurology, NS - Dr Ellery Guthrie, PCCM, Cards, palliative care  PUD Prophylaxis : PPI   Procedures  :            Disposition Plan  :    Status is: Inpatient  DVT Prophylaxis  :    Place and maintain sequential compression device Start: 10/27/23 1110 SCDs Start: 10/27/23 0329    Lab Results  Component Value Date   PLT 227 11/08/2023    Diet :  Diet Order             Diet NPO time specified  Diet effective now                    Inpatient Medications  Scheduled Meds:  amiodarone  200 mg Per Tube Daily   aspirin   81 mg Per Tube Daily   Or   aspirin   300 mg Rectal Daily   Chlorhexidine  Gluconate Cloth  6 each Topical Daily   cyanocobalamin   500 mcg Per Tube Daily   ezetimibe   10 mg Per Tube Daily   feeding supplement (PROSource TF20)  60 mL Per Tube Daily   fiber supplement (BANATROL TF)  60 mL Per Tube BID   insulin  aspart  0-20 Units Subcutaneous Q4H   insulin  aspart  8 Units Subcutaneous Q4H   insulin  glargine-yfgn  25 Units Subcutaneous Daily   lactulose  30 g Per Tube BID   levETIRAcetam   1,000 mg Per Tube BID   metoprolol tartrate  25 mg Per Tube  Q8H   multivitamin with minerals  1 tablet Per Tube Daily   mouth rinse  15 mL Mouth Rinse 4 times per day   pantoprazole  (PROTONIX ) IV  40 mg Intravenous QHS   predniSONE   10 mg Per Tube Q breakfast   thiamine   100 mg Per Tube Daily   Continuous Infusions:  feeding supplement (OSMOLITE 1.5 CAL) 45 mL/hr at 11/07/23 1700   PRN Meds:.acetaminophen  (TYLENOL ) oral liquid 160 mg/5 mL, diltiazem, hydrALAZINE , mouth rinse  Antibiotics  :    Anti-infectives (From admission, onward)    Start     Dose/Rate Route Frequency Ordered Stop   11/01/23 1400  cefTRIAXone  (ROCEPHIN ) 2 g in sodium chloride  0.9 % 100 mL IVPB        2 g 200 mL/hr over 30 Minutes Intravenous Every 24 hours 11/01/23 1025 11/04/23 1511   10/29/23 0845  Ampicillin -Sulbactam (UNASYN ) 3 g in sodium chloride  0.9 % 100 mL IVPB  Status:  Discontinued        3 g 200 mL/hr over 30 Minutes Intravenous Every 6 hours 10/29/23 0753 11/01/23 1025         Objective:   Vitals:   11/07/23 2321 11/08/23 0346 11/08/23 0500 11/08/23 0505  BP:  121/66    Pulse: 89   83  Resp:      Temp: 97.6 F (36.4 C) 99.3 F (37.4 C)    TempSrc: Oral Oral    SpO2: 98%  Weight:   54.8 kg   Height:        Wt Readings from Last 3 Encounters:  11/08/23 54.8 kg  08/29/23 56.7 kg  04/22/23 61.1 kg     Intake/Output Summary (Last 24 hours) at 11/08/2023 0726 Last data filed at 11/08/2023 0610 Gross per 24 hour  Intake 1420 ml  Output 1700 ml  Net -280 ml     Physical Exam  Awake Alert, No new F.N deficits, Normal affect [place].AT,PERRAL Supple Neck, No JVD,   Symmetrical Chest wall movement, Good air movement bilaterally, CTAB RRR,No Gallops,Rubs or new Murmurs,  +ve B.Sounds, Abd Soft, No tenderness,   No Cyanosis, Clubbing or edema       Data Review:    Recent Labs  Lab 11/04/23 0710 11/04/23 1527 11/05/23 0603 11/06/23 0506 11/07/23 0455 11/08/23 0425  WBC 8.1  --  15.6* 12.3* 9.2 7.5  HGB 9.4* 9.5* 11.6* 10.2*  10.5* 9.4*  HCT 29.9* 28.0* 36.0 32.0* 32.9* 29.7*  PLT 155  --  225 231 226 227  MCV 102.7*  --  98.6 99.4 101.5* 100.7*  MCH 32.3  --  31.8 31.7 32.4 31.9  MCHC 31.4  --  32.2 31.9 31.9 31.6  RDW 14.4  --  14.6 14.8 14.9 14.7  LYMPHSABS  --   --   --   --  1.6 1.6  MONOABS  --   --   --   --  0.7 0.7  EOSABS  --   --   --   --  0.3 0.3  BASOSABS  --   --   --   --  0.1 0.0    Recent Labs  Lab 11/04/23 0710 11/04/23 1527 11/05/23 0603 11/06/23 0506 11/07/23 0455 11/07/23 0818 11/08/23 0425  NA 134* 132* 134* 135 136  --  135  K 4.0 4.1 4.1 3.4* 4.2  --  3.8  CL 103  --  100 105 103  --  107  CO2 21*  --  20* 21* 19*  --  19*  ANIONGAP 10  --  14 9 14   --  9  GLUCOSE 213*  --  173* 237* 139*  --  201*  BUN 17  --  15 19 20   --  15  CREATININE 0.47  --  0.51 0.54 0.45  --  0.48  AST  --   --   --   --   --   --  79*  ALT  --   --   --   --   --   --  62*  ALKPHOS  --   --   --   --   --   --  224*  BILITOT  --   --   --   --   --   --  0.7  ALBUMIN  --   --   --   --   --   --  2.4*  TSH  --   --   --   --  4.013  --   --   AMMONIA  --   --   --   --   --  70*  --   BNP  --   --   --   --   --  50.7 146.8*  MG 2.0  --  2.0 2.0 2.0  --  2.0  PHOS 3.1  --  3.3 3.1 3.4  --  4.0  CALCIUM   8.3*  --  8.3* 8.3* 8.7*  --  7.9*      Recent Labs  Lab 11/04/23 0710 11/05/23 0603 11/06/23 0506 11/07/23 0455 11/07/23 0818 11/08/23 0425  TSH  --   --   --  4.013  --   --   AMMONIA  --   --   --   --  70*  --   BNP  --   --   --   --  50.7 146.8*  MG 2.0 2.0 2.0 2.0  --  2.0  CALCIUM  8.3* 8.3* 8.3* 8.7*  --  7.9*    --------------------------------------------------------------------------------------------------------------- Lab Results  Component Value Date   CHOL 182 11/05/2023   HDL 39 (L) 11/05/2023   LDLCALC 119 (H) 11/05/2023   LDLDIRECT 133.0 09/15/2021   TRIG 119 11/05/2023   CHOLHDL 4.7 11/05/2023    Lab Results  Component Value Date   HGBA1C 6.8 (H)  10/27/2023   Recent Labs    11/07/23 0455  TSH 4.013  FREET4 0.95    Radiology Report ECHOCARDIOGRAM COMPLETE Result Date: 11/07/2023    ECHOCARDIOGRAM REPORT   Patient Name:   RYLAND SMOOTS Date of Exam: 11/07/2023 Medical Rec #:  [id number]     Height:       64.0 in Accession #:    [id number]    Weight:       124.6 lb Date of Birth:  07-25-1950    BSA:          1.600 m Patient Age:    72 years      BP:           117/71 mmHg Patient Gender: F             HR:           89 bpm. Exam Location:  Inpatient Procedure: 2D Echo, Cardiac Doppler and Color Doppler (Both Spectral and Color            Flow Doppler were utilized during procedure). Indications:    Abnormal ECG R94.31  History:        Patient has prior history of Echocardiogram examinations, most                 recent 10/27/2023.  Sonographer:    Terrilee Few RCS Referring Phys: Florie Husband [facility]  Sonographer Comments: Image acquisition challenging due to uncooperative patient. IMPRESSIONS  1. Left ventricular ejection fraction, by estimation, is 60 to 65%. The left ventricle has normal function. The left ventricle has no regional wall motion abnormalities. There is moderate concentric left ventricular hypertrophy. Left ventricular diastolic parameters are consistent with Grade I diastolic dysfunction (impaired relaxation).  2. Right ventricular systolic function is normal. The right ventricular size is normal. There is normal pulmonary artery systolic pressure. The estimated right ventricular systolic pressure is 25.3 mmHg.  3. The mitral valve is normal in structure. Trivial mitral valve regurgitation. No evidence of mitral stenosis.  4. The aortic valve is tricuspid. There is severe calcifcation of the aortic valve. Aortic valve regurgitation is not visualized. Moderate to severe aortic valve stenosis. Aortic valve mean gradient measures 26.0 mmHg, AVA 0.96 cm^2.  5. The inferior vena cava is normal in size with greater than 50% respiratory  variability, suggesting right atrial pressure of 3 mmHg. FINDINGS  Left Ventricle: Left ventricular ejection fraction, by estimation, is 60 to 65%. The left ventricle has normal function. The left ventricle has no regional wall motion abnormalities. The  left ventricular internal cavity size was normal in size. There is  moderate concentric left ventricular hypertrophy. Left ventricular diastolic parameters are consistent with Grade I diastolic dysfunction (impaired relaxation). Right Ventricle: The right ventricular size is normal. No increase in right ventricular wall thickness. Right ventricular systolic function is normal. There is normal pulmonary artery systolic pressure. The tricuspid regurgitant velocity is 2.36 m/s, and  with an assumed right atrial pressure of 3 mmHg, the estimated right ventricular systolic pressure is 25.3 mmHg. Left Atrium: Left atrial size was normal in size. Right Atrium: Right atrial size was normal in size. Pericardium: There is no evidence of pericardial effusion. Mitral Valve: The mitral valve is normal in structure. Trivial mitral valve regurgitation. No evidence of mitral valve stenosis. Tricuspid Valve: The tricuspid valve is normal in structure. Tricuspid valve regurgitation is trivial. Aortic Valve: The aortic valve is tricuspid. There is severe calcifcation of the aortic valve. Aortic valve regurgitation is not visualized. Moderate to severe aortic stenosis is present. Aortic valve mean gradient measures 26.0 mmHg. Aortic valve peak gradient measures 39.4 mmHg. Aortic valve area, by VTI measures 1.08 cm. Pulmonic Valve: The pulmonic valve was normal in structure. Pulmonic valve regurgitation is not visualized. Aorta: The aortic root is normal in size and structure. Venous: The inferior vena cava is normal in size with greater than 50% respiratory variability, suggesting right atrial pressure of 3 mmHg. IAS/Shunts: No atrial level shunt detected by color flow Doppler.  LEFT  VENTRICLE PLAX 2D LVIDd:         2.60 cm     Diastology LV PW:         1.20 cm     LV e' medial:    6.53 cm/s LV IVS:        1.40 cm     LV E/e' medial:  11.7 LVOT diam:     2.10 cm     LV e' lateral:   6.53 cm/s LV SV:         55          LV E/e' lateral: 11.7 LV SV Index:   34 LVOT Area:     3.46 cm  LV Volumes (MOD) LV vol d, MOD A2C: 47.6 ml LV vol d, MOD A4C: 46.9 ml LV vol s, MOD A2C: 20.5 ml LV vol s, MOD A4C: 18.6 ml LV SV MOD A2C:     27.1 ml LV SV MOD A4C:     46.9 ml LV SV MOD BP:      30.3 ml RIGHT VENTRICLE             IVC RV S prime:     19.30 cm/s  IVC diam: 1.60 cm TAPSE (M-mode): 2.0 cm LEFT ATRIUM             Index        RIGHT ATRIUM           Index LA diam:        2.80 cm 1.75 cm/m   RA Area:     11.00 cm LA Vol (A2C):   26.3 ml 16.44 ml/m  RA Volume:   23.30 ml  14.56 ml/m LA Vol (A4C):   30.8 ml 19.25 ml/m LA Biplane Vol: 31.0 ml 19.38 ml/m  AORTIC VALVE AV Area (Vmax):    1.14 cm AV Area (Vmean):   1.09 cm AV Area (VTI):     1.08 cm AV Vmax:  314.00 cm/s AV Vmean:          217.333 cm/s AV VTI:            0.507 m AV Peak Grad:      39.4 mmHg AV Mean Grad:      26.0 mmHg LVOT Vmax:         103.00 cm/s LVOT Vmean:        68.300 cm/s LVOT VTI:          0.158 m LVOT/AV VTI ratio: 0.31  AORTA Ao Root diam: 3.40 cm Ao Asc diam:  3.60 cm MITRAL VALVE                TRICUSPID VALVE MV Area (PHT): 5.31 cm     TR Peak grad:   22.3 mmHg MV Decel Time: 143 msec     TR Vmax:        236.00 cm/s MV E velocity: 76.20 cm/s MV A velocity: 109.00 cm/s  SHUNTS MV E/A ratio:  0.70         Systemic VTI:  0.16 m                             Systemic Diam: 2.10 cm Dalton McleanMD Electronically signed by Archer Bear Signature Date/Time: 11/07/2023/1:37:34 PM    Final    DG Abd Portable 1V Result Date: 11/07/2023 CLINICAL DATA:  Abdominal pain. EXAM: PORTABLE ABDOMEN - 1 VIEW COMPARISON:  Radiograph 10/28/2023, CT 10/26/2023 FINDINGS: Tip of the weighted enteric tube in the region of the distal  stomach. No bowel dilatation or evidence of obstruction. No significant formed stool in the colon. Multiple vascular calcifications. IVC filter in place. 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 IMPRESSION: 1. No radiographic explanation for pain. Nonobstructive bowel gas pattern. 2. Tip of the weighted enteric tube in the region of the distal stomach. Electronically Signed   By: Chadwick Colonel M.D.   On: 11/07/2023 12:08   DG Chest Port 1 View Result Date: 11/07/2023 CLINICAL DATA:  Shortness of breath. EXAM: PORTABLE CHEST 1 VIEW COMPARISON:  10/31/2023 FINDINGS: Interval extubation. Left IJ central line has been removed in the interval. A feeding tube passes into the stomach although the distal tip position is not included on the film. The cardio pericardial silhouette is enlarged. The lungs are clear without focal pneumonia, edema, pneumothorax or pleural effusion. Bones are diffusely demineralized. Telemetry leads overlie the chest. IMPRESSION: 1. Interval extubation and removal of left IJ central line. 2. No acute cardiopulmonary findings. Electronically Signed   By: Donnal Fusi M.D.   On: 11/07/2023 07:17     Signature  -   Lynnwood Sauer M.D on 11/08/2023 at 7:26 AM   -  To page go to [email or web address]

## 2023-11-08 NOTE — Progress Notes (Signed)
 Occupational Therapy Treatment Patient Details Name: Miranda Cochran MRN: [id number] DOB: 18-Aug-1950 Today's Date: 11/08/2023   History of present illness Pt is a 73 y.o. female presenting after fall 5/14. CT with occipital skull fracture, traumatic subarachnoid hemorrhage and right subdural hematoma. Repeat CT increased size of R subdural hematoma. Intubated 5/15-5/23 secondary to respiratory failure secondary to E. coli, Streptococcus HAP. EEG 5/16 with cortical dysfunction arising from R hemisphere, moderate-severe diffuse encephalopathy. EEG 5/17 with seizure activity averaging 4 seizures per hour lasting 1 min each; resolved with adjustment to medications. RLE DVT 5/19 and IVC filter placed. PMH significant for autoimmune hepatitis with cirrhosis of liver, HTN, depression, GERD, HLD, DM, IBS, osteoporosis, thyroid  nodule, vascular disease, vitamin D deficiency.   OT comments  Pt making progress towards goals; tolerated 2 therapy sessions this date (PT earlier in AM). Pt received with spouse in room, more conversational than OT eval, and able to state name, DOB, location and husband's name. Pt restless and often requires hand held assist to stop pulling at lines/leads. MAX A for bed mobility to log roll, fluctuating static sitting balance from MAX - close SBA with posterior / R lateral lean. Able to use BUE to support self, 15 mins tolerated sitting EOB to facilitate increased balance needed to progress ADL performance. Pt found to be incontinent, returned to bed and required +2 TOTAL for pericare, MAX A for rolling for linen change. Discharge recommendation remains appropriate, Patient will benefit from intensive inpatient follow-up therapy, >3 hours/day.       If plan is discharge home, recommend the following:  Two people to help with walking and/or transfers;Two people to help with bathing/dressing/bathroom;Assistance with cooking/housework;Help with stairs or ramp for entrance;Assist for  transportation;Direct supervision/assist for medications management;Direct supervision/assist for financial management;Assistance with feeding   Equipment Recommendations  Other (comment)       Precautions / Restrictions Precautions Precautions: Fall;Other (comment) Recall of Precautions/Restrictions: Impaired Precaution/Restrictions Comments: Prior compression fxs, no formal precautions ordered. Cortrax Restrictions Weight Bearing Restrictions Per Provider Order: No       Mobility Bed Mobility Overal bed mobility: Needs Assistance Bed Mobility: Rolling, Sidelying to Sit Rolling: Mod assist, Used rails Sidelying to sit: Max assist, HOB elevated       General bed mobility comments: log rolling due to back, modA at trunk and max to facilitate BLE off bed    Transfers Overall transfer level: Needs assistance                 General transfer comment: transfer not attempted     Balance Overall balance assessment: Needs assistance Sitting-balance support: Single extremity supported, Feet supported Sitting balance-Leahy Scale: Poor Sitting balance - Comments: maxA initally due to fatigue, progressing to supervision but mostly minA with R lateral / posterior lean Postural control: Posterior lean, Right lateral lean, Left lateral lean Standing balance support: Bilateral upper extremity supported   Standing balance comment: NT                           ADL either performed or assessed with clinical judgement   ADL Overall ADL's : Needs assistance/impaired Eating/Feeding: NPO               Upper Body Dressing : Bed level;Minimal assistance Upper Body Dressing Details (indicate cue type and reason): max due to restlessness/pt pulling at IV         Toileting- Clothing Manipulation and Hygiene: +2 for physical assistance;+2  for safety/equipment;Bed level Toileting - Clothing Manipulation Details (indicate cue type and reason): found to have purewick  failure (likely due to pt restless in bed) and incontinent of BM. +2 for rolling and linen change with pericare       General ADL Comments: Pt able to sit EOB for +15 mins during session, initially max progressing to close supervision for several seconds. Noted to have loose BM, returned to bed for bed level pericare and linen change    Extremity/Trunk Assessment Upper Extremity Assessment RUE Deficits / Details: generally weak, spouse says she brushed teeth earlier today LUE Deficits / Details: decreased L sides awareness LUE Sensation: decreased light touch;decreased proprioception LUE Coordination: decreased gross motor;decreased fine motor   Lower Extremity Assessment RLE Deficits / Details: pt with persistant kicking of RLE through session. does so to command and then continued through session. unable to sustain for MMT and not following commands for toes/ankle for MMT. unable to communicate regardin sensation. no clonus noted LLE Deficits / Details: pt attempted to wiggle toes x3 to command, no attempt to move at ankle. did follow command to "kick both legs" with trace activation of left quad x3. not able to answer questions regarding sensation   Cervical / Trunk Assessment Cervical / Trunk Assessment: Kyphotic;Other exceptions Cervical / Trunk Exceptions: significant forward head posture as well at EOB, husband reports this is not baseline             Communication Communication Communication: Impaired Factors Affecting Communication: Difficulty expressing self;Reduced clarity of speech   Cognition Arousal: Alert Behavior During Therapy: Flat affect, Restless Cognition: Cognition impaired   Orientation impairments: Time Awareness: Intellectual awareness impaired, Online awareness impaired Memory impairment (select all impairments): Short-term memory, Working memory Attention impairment (select first level of impairment): Focused attention, Sustained attention Executive  functioning impairment (select all impairments): Initiation, Organization, Sequencing, Problem solving OT - Cognition Comments: Pt making conversation this session, knows name, DOB, place and husband in room. Very restless, attempting to pull at IV/O2 probe, etc often requiring HHA to redirect.                 Following commands: Impaired Following commands impaired: Follows one step commands with increased time, Follows multi-step commands inconsistently      Cueing   Cueing Techniques: Verbal cues, Gestural cues, Tactile cues, Visual cues  Exercises Other Exercises Other Exercises: OT facilitated weight shifts sitting EOB to improve static seated balance needed for ADLs       General Comments spouse present, NT assist with linen change, RN notified of pt on RA (Sterling pulled off from O2 on wall, Spo2 95-98% throughout), lines and leads intact start and end of session, spouse reported he will let staff know when he is leaving to don mitts    Pertinent Vitals/ Pain       Pain Assessment Pain Assessment: Faces Faces Pain Scale: Hurts little more Pain Location: generalized, back Pain Descriptors / Indicators: Discomfort Pain Intervention(s): Limited activity within patient's tolerance, Monitored during session, Repositioned  Home Living Family/patient expects to be discharged to:: Private residence Living Arrangements: Spouse/significant other Available Help at Discharge: Family Type of Home: Apartment Home Access: Level entry     Home Layout: One level     Bathroom Shower/Tub: Chief Strategy Officer: Standard     Home Equipment: Cane - quad              Frequency  Min 2X/week  Progress Toward Goals  OT Goals(current goals can now be found in the care plan section)  Progress towards OT goals: Progressing toward goals  Acute Rehab OT Goals OT Goal Formulation: With patient Time For Goal Achievement: 11/19/23 Potential to Achieve Goals:  Good ADL Goals Pt Will Perform Grooming: with min assist;sitting Pt Will Perform Upper Body Bathing: with min assist;sitting Pt Will Perform Lower Body Bathing: with mod assist;sitting/lateral leans;sit to/from stand Pt Will Transfer to Toilet: with mod assist;stand pivot transfer Pt/caregiver will Perform Home Exercise Program: Left upper extremity;Increased ROM;Increased strength Additional ADL Goal #1: Pt will follow one step commands 100% of time with min cues  Plan         AM-PAC OT "6 Clicks" Daily Activity     Outcome Measure   Help from another person eating meals?: Total Help from another person taking care of personal grooming?: A Lot Help from another person toileting, which includes using toliet, bedpan, or urinal?: Total Help from another person bathing (including washing, rinsing, drying)?: Total Help from another person to put on and taking off regular upper body clothing?: A Lot Help from another person to put on and taking off regular lower body clothing?: Total 6 Click Score: 8    End of Session    OT Visit Diagnosis: Unsteadiness on feet (R26.81);Muscle weakness (generalized) (M62.81);Repeated falls (R29.6);Other abnormalities of gait and mobility (R26.89);History of falling (Z91.81);Low vision, both eyes (H54.2);Feeding difficulties (R63.3);Other symptoms and signs involving cognitive function;Cognitive communication deficit (R41.841);Hemiplegia and hemiparesis Symptoms and signs involving cognitive functions: Nontraumatic SAH Hemiplegia - Right/Left: Left Hemiplegia - dominant/non-dominant: Non-Dominant Hemiplegia - caused by: Nontraumatic SAH (SDH)   Activity Tolerance Patient tolerated treatment well   Patient Left in bed;with call bell/phone within reach;with bed alarm set;with family/visitor present   Nurse Communication Mobility status (restless)        Time: 0981-1914 OT Time Calculation (min): 41 min  Charges: OT General Charges $OT Visit: 1  Visit OT Treatments $Self Care/Home Management : 38-52 mins  Dewey Neukam L. Tymara Saur, OTR/L  11/08/23, 4:11 PM

## 2023-11-08 NOTE — Progress Notes (Signed)
 Speech Language Pathology Treatment: Dysphagia  Patient Details Name: SHANELE NISSAN MRN: [id number] DOB: 11-07-1950 Today's Date: 11/08/2023 Time: 4098-1191 SLP Time Calculation (min) (ACUTE ONLY): 10 min  Assessment / Plan / Recommendation Clinical Impression  Patient seen today to address dysphagia goals including determining readiness for po intake, instrumental swallow evaluation. Pt awake in bed, pleasant and cooperative and attempts to follow directions - delayed. Noted white coating on hard and soft palate - which cleared with oral moisture and suctioning. Patient voicee today - with max cues and voice subjectively appeared weak but was acheived. Pt notably with right palatal deviation and left facial asymmetry - concerning for potential at minimum facial and vagus nerve deficits. PO trials included tsps of thin water  and single small bolus of applesauce mixed with juice. Pt's swallow immediately followed by overt coughing - that was not productive and appeared uncomfortable for patient. Patient did not follow directions to cough and "hock" in attempts to expectorate. At this time, pt is showing improvement but SLP recommends to defer for instrumental evaluation as anticipate she may be unable to tolerate any diet at this time. Recommend continue oral care and frequent oral moisture, encouraging pt to swallow.  Advised pt to goal being for pt to have improved voicing, cough strength and swallowing on cue.    Will follow up next date for readiness for instrumental swallow evaluation. No family present during session, note palliative has seen pt's spouse and full code, tx remains goal.     HPI HPI: Pt is a 73 y.o. female presenting after fall 5/14. CT with occipital skull fracture, traumatic subarachnoid hemorrhage and right subdural hematoma. Repeat CT increased size of R subdural hematoma. Intubated 5/15-5/23 secondary to respiratory failure secondary to E. coli, Streptococcus HAP. EEG 5/16 with  cortical dysfunction arising from R hemisphere, moderate-severe diffuse encephalopathy. EEG 5/17 with seizure activity averaging 4 seizures per hour lasting 1 min each; resolved with adjustment to medications. RLE DVT 5/19 and IVC filter placed. PMH significant for autoimmune hepatitis with cirrhosis of liver, HTN, depression, GERD, HLD, DM, IBS, osteoporosis, thyroid  nodule, vascular disease, vitamin D deficiency.      SLP Plan  Continue with current plan of care      Recommendations for follow up therapy are one component of a multi-disciplinary discharge planning process, led by the attending physician.  Recommendations may be updated based on patient status, additional functional criteria and insurance authorization.    Recommendations  Diet recommendations: NPO (oral moisture and encouraging swallow) Medication Administration: Via alternative means                  Oral care QID     Dysphagia, oropharyngeal phase (R13.12)     Continue with current plan of care   Maudie Sorrow, MS [facility] SLP Acute Rehab Services Office [phone]   Chantal Comment  11/08/2023, 10:01 AM

## 2023-11-08 NOTE — Progress Notes (Addendum)
 Nutrition Follow-up  DOCUMENTATION CODES:  Severe malnutrition in context of chronic illness  INTERVENTION:  Continue tube feeding via Cortrak tube: Osmolite 1.5 @ 45 ml/hr (1080 ml per day) Prosource TF20 60 ml daily Add FWF 200ml q8 hours (600 ml per day)  Provides 1700 kcal, 87 gm protein, 1420 ml free water daily   Continue Thiamine  100 mg daily   Continue Banatrol BID -provides 45kcal, 5g soluble fiber and 2g protein per serving.   MVI with minerals daily via tube  Monitor diet advancement and ability to wean tube feeding  Assess for appropriateness of PEG tube, if long-term nutrition support indicated   NUTRITION DIAGNOSIS:  Severe Malnutrition related to chronic illness as evidenced by severe fat depletion, severe muscle depletion. 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  5/14 - admit 5/15 - intubated 5/17 - seizures on EEG; sputum culture grew E.coli and step pneumoniae  5/19 - LE duplex w/ R DVT 5/21 - s/p cortrak placement; tip gastric 5/23 - extubated 5/26 - transfer to floor; FMS removed 5/27 - TTE: LV EF 60-65%; severe calcification of aortic valve  Speech saw today and encouraging patient to swallow. Continue to recommend NPO until risk of aspiration declines. Per MD during rounds, will d/c Cortrak as soon as diet advanced. Mentation improving. Continue with left-sided hemiparesis and with significant weakness. Per MD, may require  PEG if long-term nutrition support indicated AEB inability to advance diet.   24 Hour Recall B: often skips L: Egg drop soup D: Asian takeout  She is awake and alert this morning, but continues with expressive aphasia and dysphagia. Husband at bedside. She has achieved goal rate of tube feedings and is tolerating. Abdomen soft, non-distended. Noted petechiae noted to BLEs. No thrush noted on exam.  Discussed with patient husband, the inability to wean tube feedings as she is unsafe to swallow, per speech. Will continue to monitor diet advancement and tolerance.   Admit Weight (5/14): 55kg - ?accuracy, as weight collected four days later showed 13.8% weight gain 5/18: 62.6kg Current Weight: 54.8kg +edema   Some generalized edema noted, however not significant. No significant weight history to review. Has shown some significant weight fluctuation this admission. Monitoring trend. Husband reports UBW hovers around 130lbs (59kg).  Net IO Since Admission: 2,470.21 mL [11/08/23 1130]   Drains/Lines: Cortrak (62cm) placed 5/21 Urinary catheter UOP: 1.7L + 1 unmeasured occurrence x24 hours  Meds: cyanocobalamin , Banatrol,  SSI 0-20 q4, SSI 8 q4, Semglee  25 daily, lactulose, MVI, pantoprazole , prednisone , thiamine   Labs:  Na+ 135 (wdl) K+ 3.4>4.2>3.8 (wdl) Ammonia 70 (5/26) WBC 12.3>9.2>7.5 (wdl) CBGs 139-201 x24 hours A1c 6.8 (10/2023)  Diet Order:   Diet Order             Diet NPO time specified  Diet effective now            EDUCATION NEEDS:   Not appropriate for education at this time  Skin:  Skin Assessment: Reviewed RN  Assessment  Last BM:  5/27 - type 6 x1  Height:  Ht Readings from Last 1 Encounters:  11/01/23 5\' 4"  (1.626 m)   Weight:  Wt Readings from Last 1 Encounters:  11/08/23 54.8 kg    BMI:  Body mass index is 20.74 kg/m.  Estimated Nutritional Needs:   Kcal:  1600-1800  Protein:  85-100 grams  Fluid:  > 1.6 L/day  Con Decant MS, RD,  LDN Registered Dietitian Clinical Nutrition RD Inpatient Contact Info in Amion

## 2023-11-08 NOTE — Progress Notes (Signed)
 Subjective:  Not very verbal post stroke Feeding tube Spoke with husband at bedside   Objective:  Vitals:   11/08/23 0346 11/08/23 0500 11/08/23 0505 11/08/23 0738  BP: 121/66   129/66  Pulse:   83 91  Resp:    19  Temp: 99.3 F (37.4 C)   (!) 97.5 F (36.4 C)  TempSrc: Oral   Oral  SpO2:    96%  Weight:  54.8 kg    Height:        Intake/Output from previous day:  Intake/Output Summary (Last 24 hours) at 11/08/2023 1110 Last data filed at 11/08/2023 0743 Gross per 24 hour  Intake 1185 ml  Output 950 ml  Net 235 ml    Physical Exam:  Frail elderly female Lungs clear Feeding tube Stroke with aphasia and left sided weakness No edema LE bruising and varicosities   Lab Results: Basic Metabolic Panel: Recent Labs    11/07/23 0455 11/08/23 0425  NA 136 135  K 4.2 3.8  CL 103 107  CO2 19* 19*  GLUCOSE 139* 201*  BUN 20 15  CREATININE 0.45 0.48  CALCIUM  8.7* 7.9*  MG 2.0 2.0  PHOS 3.4 4.0   Liver Function Tests: Recent Labs    11/08/23 0425  AST 79*  ALT 62*  ALKPHOS 224*  BILITOT 0.7  PROT 6.0*  ALBUMIN 2.4*   No results for input(s): "LIPASE", "AMYLASE" in the last 72 hours. CBC: Recent Labs    11/07/23 0455 11/08/23 0425  WBC 9.2 7.5  NEUTROABS 6.5 4.8  HGB 10.5* 9.4*  HCT 32.9* 29.7*  MCV 101.5* 100.7*  PLT 226 227    Thyroid  Function Tests: Recent Labs    11/07/23 0455  TSH 4.013     Imaging: ECHOCARDIOGRAM COMPLETE Result Date: 11/07/2023    ECHOCARDIOGRAM REPORT   Patient Name:   REHANNA OLOUGHLIN Date of Exam: 11/07/2023 Medical Rec #:  [id number]     Height:       64.0 in Accession #:    [id number]    Weight:       124.6 lb Date of Birth:  14-Jan-1951    BSA:          1.600 m Patient Age:    73 years      BP:           117/71 mmHg Patient Gender: F             HR:           89 bpm. Exam Location:  Inpatient Procedure: 2D Echo, Cardiac Doppler and Color Doppler (Both Spectral and Color            Flow Doppler were utilized during  procedure). Indications:    Abnormal ECG R94.31  History:        Patient has prior history of Echocardiogram examinations, most                 recent 10/27/2023.  Sonographer:    Terrilee Few RCS Referring Phys: Florie Husband Andrez Banker [facility]  Sonographer Comments: Image acquisition challenging due to uncooperative patient. IMPRESSIONS  1. Left ventricular ejection fraction, by estimation, is 60 to 65%. The left ventricle has normal function. The left ventricle has no regional wall motion abnormalities. There is moderate concentric left ventricular hypertrophy. Left ventricular diastolic parameters are consistent with Grade I diastolic dysfunction (impaired relaxation).  2. Right ventricular systolic function is normal. The right ventricular size is normal. There  is normal pulmonary artery systolic pressure. The estimated right ventricular systolic pressure is 25.3 mmHg.  3. The mitral valve is normal in structure. Trivial mitral valve regurgitation. No evidence of mitral stenosis.  4. The aortic valve is tricuspid. There is severe calcifcation of the aortic valve. Aortic valve regurgitation is not visualized. Moderate to severe aortic valve stenosis. Aortic valve mean gradient measures 26.0 mmHg, AVA 0.96 cm^2.  5. The inferior vena cava is normal in size with greater than 50% respiratory variability, suggesting right atrial pressure of 3 mmHg. FINDINGS  Left Ventricle: Left ventricular ejection fraction, by estimation, is 60 to 65%. The left ventricle has normal function. The left ventricle has no regional wall motion abnormalities. The left ventricular internal cavity size was normal in size. There is  moderate concentric left ventricular hypertrophy. Left ventricular diastolic parameters are consistent with Grade I diastolic dysfunction (impaired relaxation). Right Ventricle: The right ventricular size is normal. No increase in right ventricular wall thickness. Right ventricular systolic function is normal. There is  normal pulmonary artery systolic pressure. The tricuspid regurgitant velocity is 2.36 m/s, and  with an assumed right atrial pressure of 3 mmHg, the estimated right ventricular systolic pressure is 25.3 mmHg. Left At[facility]: Left atrial size was normal in size. Right [facility]: Right atrial size was normal in size. Pericardium: There is no evidence of pericardial effusion. Mitral Valve: The mitral valve is normal in structure. Trivial mitral valve regurgitation. No evidence of mitral valve stenosis. Tricuspid Valve: The tricuspid valve is normal in structure. Tricuspid valve regurgitation is trivial. Aortic Valve: The aortic valve is tricuspid. There is severe calcifcation of the aortic valve. Aortic valve regurgitation is not visualized. Moderate to severe aortic stenosis is present. Aortic valve mean gradient measures 26.0 mmHg. Aortic valve peak gradient measures 39.4 mmHg. Aortic valve area, by VTI measures 1.08 cm. Pulmonic Valve: The pulmonic valve was normal in structure. Pulmonic valve regurgitation is not visualized. Aorta: The aortic root is normal in size and structure. Venous: The inferior vena cava is normal in size with greater than 50% respiratory variability, suggesting right atrial pressure of 3 mmHg. IAS/Shunts: No atrial level shunt detected by color flow Doppler.  LEFT VENTRICLE PLAX 2D LVIDd:         2.60 cm     Diastology LV PW:         1.20 cm     LV e' medial:    6.53 cm/s LV IVS:        1.40 cm     LV E/e' medial:  11.7 LVOT diam:     2.10 cm     LV e' lateral:   6.53 cm/s LV SV:         55          LV E/e' lateral: 11.7 LV SV Index:   34 LVOT Area:     3.46 cm  LV Volumes (MOD) LV vol d, MOD A2C: 47.6 ml LV vol d, MOD A4C: 46.9 ml LV vol s, MOD A2C: 20.5 ml LV vol s, MOD A4C: 18.6 ml LV SV MOD A2C:     27.1 ml LV SV MOD A4C:     46.9 ml LV SV MOD BP:      30.3 ml RIGHT VENTRICLE             IVC RV S prime:     19.30 cm/s  IVC diam: 1.60 cm TAPSE (M-mode): 2.0 cm LEFT ATRIUM  Index         RIGHT ATRIUM           Index LA diam:        2.80 cm 1.75 cm/m   RA Area:     11.00 cm LA Vol (A2C):   26.3 ml 16.44 ml/m  RA Volume:   23.30 ml  14.56 ml/m LA Vol (A4C):   30.8 ml 19.25 ml/m LA Biplane Vol: 31.0 ml 19.38 ml/m  AORTIC VALVE AV Area (Vmax):    1.14 cm AV Area (Vmean):   1.09 cm AV Area (VTI):     1.08 cm AV Vmax:           314.00 cm/s AV Vmean:          217.333 cm/s AV VTI:            0.507 m AV Peak Grad:      39.4 mmHg AV Mean Grad:      26.0 mmHg LVOT Vmax:         103.00 cm/s LVOT Vmean:        68.300 cm/s LVOT VTI:          0.158 m LVOT/AV VTI ratio: 0.31  AORTA Ao Root diam: 3.40 cm Ao Asc diam:  3.60 cm MITRAL VALVE                TRICUSPID VALVE MV Area (PHT): 5.31 cm     TR Peak grad:   22.3 mmHg MV Decel Time: 143 msec     TR Vmax:        236.00 cm/s MV E velocity: 76.20 cm/s MV A velocity: 109.00 cm/s  SHUNTS MV E/A ratio:  0.70         Systemic VTI:  0.16 m                             Systemic Diam: 2.10 cm Dalton McleanMD Electronically signed by Archer Bear Signature Date/Time: 11/07/2023/1:37:34 PM    Final    DG Abd Portable 1V Result Date: 11/07/2023 CLINICAL DATA:  Abdominal pain. EXAM: PORTABLE ABDOMEN - 1 VIEW COMPARISON:  Radiograph 10/28/2023, CT 10/26/2023 FINDINGS: Tip of the weighted enteric tube in the region of the distal stomach. No bowel dilatation or evidence of obstruction. No significant formed stool in the colon. Multiple vascular calcifications. IVC filter in place. 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 IMPRESSION: 1. No radiographic explanation for pain. Nonobstructive bowel gas pattern. 2. Tip of the weighted enteric tube in the region of the distal stomach. Electronically Signed   By: Chadwick Colonel M.D.   On: 11/07/2023 12:08   DG Chest Port 1 View Result Date: 11/07/2023 CLINICAL DATA:  Shortness of breath. EXAM: PORTABLE CHEST 1 VIEW COMPARISON:  10/31/2023 FINDINGS: Interval extubation. Left IJ central line has been removed in the  interval. A feeding tube passes into the stomach although the distal tip position is not included on the film. The cardio pericardial silhouette is enlarged. The lungs are clear without focal pneumonia, edema, pneumothorax or pleural effusion. Bones are diffusely demineralized. Telemetry leads overlie the chest. IMPRESSION: 1. Interval extubation and removal of left IJ central line. 2. No acute cardiopulmonary findings. Electronically Signed   By: Donnal Fusi M.D.   On: 11/07/2023 07:17    Cardiac Studies:  ECG:  NSR PAC/PVC   Telemetry: No further SVT NSR rates 90   Echo: 11/07/23 EF 60-65% moderate AS  mean gradient 26 peak 39 mmhg DVI 0.31   Medications:    amiodarone  200 mg Per Tube Daily   aspirin   81 mg Per Tube Daily   Or   aspirin   300 mg Rectal Daily   Chlorhexidine  Gluconate Cloth  6 each Topical Daily   cyanocobalamin   500 mcg Per Tube Daily   ezetimibe   10 mg Per Tube Daily   feeding supplement (PROSource TF20)  60 mL Per Tube Daily   fiber supplement (BANATROL TF)  60 mL Per Tube BID   insulin  aspart  0-20 Units Subcutaneous Q4H   insulin  aspart  8 Units Subcutaneous Q4H   insulin  glargine-yfgn  25 Units Subcutaneous Daily   lactulose  30 g Per Tube BID   levETIRAcetam   1,000 mg Per Tube BID   metoprolol tartrate  25 mg Per Tube Q8H   multivitamin with minerals  1 tablet Per Tube Daily   mouth rinse  15 mL Mouth Rinse 4 times per day   pantoprazole  (PROTONIX ) IV  40 mg Intravenous QHS   predniSONE   10 mg Per Tube Q breakfast   thiamine   100 mg Per Tube Daily      feeding supplement (OSMOLITE 1.5 CAL) 45 mL/hr at 11/07/23 1700    Assessment/Plan:   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 Exacerbated by stroke continue amiodarone and lopressor via tube.  AS:  more moderate not a candidate for TAVR EF normal  CVA:  significant deficits Husband very attentive will need inpatient rehab and ? 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

## 2023-11-08 NOTE — TOC Progression Note (Signed)
 Transition of Care [facility]) - Progression Note    Patient Details  Name: Miranda Cochran MRN: [id number] Date of Birth: 01/20/1951  [facility]) CM/SW Contact  Jannice Mends, LCSW Phone Number: 11/08/2023, 8:44 AM  Clinical Narrative:    Patient transferred to 5W. CSW will continue to follow for medical progression.     Barriers to Discharge: Continued Medical Work up  Expected Discharge Plan and Services                                               Social Determinants of Health (SDOH) Interventions SDOH Screenings   Food Insecurity: No Food Insecurity (04/21/2023)  Housing: Low Risk  (04/21/2023)  Transportation Needs: No Transportation Needs (04/21/2023)  Utilities: Low Risk  (03/21/2023)   Received from [facility]  Alcohol Screen: Low Risk  (04/21/2023)  Depression (PHQ2-9): Low Risk  (08/29/2023)  Financial Resource Strain: Low Risk  (04/21/2023)  Physical Activity: Insufficiently Active (04/21/2023)  Social Connections: Moderately Integrated (04/21/2023)  Stress: Stress Concern Present (04/21/2023)  Tobacco Use: Low Risk  (10/27/2023)    Readmission Risk Interventions     No data to display

## 2023-11-08 NOTE — Progress Notes (Signed)
 Inpatient Rehab Admissions Coordinator:   Pt continues to require significant physical assist for all mobility.  Other rehab venues should be considered.    Loye Rumble, PT, DPT Admissions Coordinator [phone] 11/08/23  10:15 AM

## 2023-11-08 NOTE — Progress Notes (Signed)
 Physical Therapy Treatment Patient Details Name: Miranda Cochran MRN: [id number] DOB: 1950-09-03 Today's Date: 11/08/2023   History of Present Illness Pt is a 73 y.o. female presenting after fall 5/14. CT with occipital skull fracture, traumatic subarachnoid hemorrhage and right subdural hematoma. Repeat CT increased size of R subdural hematoma. Intubated 5/15-5/23 secondary to respiratory failure secondary to E. coli, Streptococcus HAP. EEG 5/16 with cortical dysfunction arising from R hemisphere, moderate-severe diffuse encephalopathy. EEG 5/17 with seizure activity averaging 4 seizures per hour lasting 1 min each; resolved with adjustment to medications. RLE DVT 5/19 and IVC filter placed. PMH significant for autoimmune hepatitis with cirrhosis of liver, HTN, depression, GERD, HLD, DM, IBS, osteoporosis, thyroid  nodule, vascular disease, vitamin D deficiency.    PT Comments  Tolerated treatment well, agreeable to get out of bed to recliner. Pt required mod assist to roll, max assist to rise to seated position. Up to min assist for seated balance, able to weight shift onto Rt elbow with CGA, required min assist for weight shifting to Lt elbow. Stood several times with mod assist, unable to weight shift and take step or pivot. Richerd Chant for transfer to [employer]. Husband present and supportive.    If plan is discharge home, recommend the following: Two people to help with walking and/or transfers;Two people to help with bathing/dressing/bathroom;Assistance with feeding;Direct supervision/assist for medications management;Direct supervision/assist for financial management;Assist for transportation;Help with stairs or ramp for entrance;Supervision due to cognitive status   Can travel by private vehicle        Equipment Recommendations  Wheelchair (measurements PT);Wheelchair cushion (measurements PT);Hospital bed;Hoyer lift    Recommendations for Other Services Rehab consult     Precautions /  Restrictions Precautions Precautions: Fall;Other (comment) Recall of Precautions/Restrictions: Impaired Precaution/Restrictions Comments: Prior compression fxs, no formal precautions ordered. cortrak Restrictions Weight Bearing Restrictions Per Provider Order: No     Mobility  Bed Mobility Overal bed mobility: Needs Assistance Bed Mobility: Rolling, Sidelying to Sit Rolling: Mod assist, Used rails Sidelying to sit: Max assist, HOB elevated       General bed mobility comments: Mod assist to roll, facilitate LEs off bed. Max A for trunk support to rise with frequent cues for technique and positional awareness.    Transfers Overall transfer level: Needs assistance Equipment used: 1 person hand held assist, Ambulation equipment used Transfers: Sit to/from Stand, Bed to chair/wheelchair/BSC Sit to Stand: Mod assist           General transfer comment: Mod assist for boost to stand x3 from edge of bed. Set-up assist for LEs to widen BOS. Pt able to power up with help, slightly posterior lean, cannot stand indpendently or take lateral steps. Stedy used for transfer to recliner, able to sustain grip with bil UEs, min assist for trunk support. Transfer via Lift Equipment: Stedy  Ambulation/Gait               General Gait Details: unable at this time   Comptroller Bed    Modified Rankin (Stroke Patients Only)       Balance Overall balance assessment: Needs assistance Sitting-balance support: Single extremity supported, Feet supported Sitting balance-Leahy Scale: Poor Sitting balance - Comments: modA with brief periods of CGA Postural control: Posterior lean, Right lateral lean, Left lateral lean Standing balance support: Bilateral upper extremity supported Standing balance-Leahy Scale: Poor Standing balance comment: Mod for standing balance  Communication  Communication Communication: Impaired Factors Affecting Communication: Difficulty expressing self;Reduced clarity of speech  Cognition Arousal: Alert Behavior During Therapy: Flat affect, Restless   PT - Cognitive impairments: Difficult to assess Difficult to assess due to: Impaired communication                     PT - Cognition Comments: Follows >75% of simple one step commands with increased time Following commands: Impaired Following commands impaired: Follows one step commands with increased time, Follows multi-step commands inconsistently    Cueing Cueing Techniques: Verbal cues, Gestural cues, Tactile cues, Visual cues  Exercises      General Comments General comments (skin integrity, edema, etc.): Husband present, supportive. SpO2 96% on RA. Returned to 2.5L at end of session.      Pertinent Vitals/Pain Pain Assessment Pain Assessment: CPOT Facial Expression: Relaxed, neutral Body Movements: Absence of movements Muscle Tension: Tense, rigid Compliance with ventilator (intubated pts.): N/A Vocalization (extubated pts.): Talking in normal tone or no sound CPOT Total: 1 Pain Location: generalized Pain Intervention(s): Monitored during session, Repositioned    Home Living Family/patient expects to be discharged to:: Private residence Living Arrangements: Spouse/significant other Available Help at Discharge: Family Type of Home: Apartment Home Access: Level entry       Home Layout: One level Home Equipment: Cane - quad      Prior Function            PT Goals (current goals can now be found in the care plan section) Acute Rehab PT Goals Patient Stated Goal: return to independence PT Goal Formulation: With patient/family Time For Goal Achievement: 11/19/23 Potential to Achieve Goals: Fair Progress towards PT goals: Progressing toward goals    Frequency    Min 3X/week      PT Plan      Co-evaluation              AM-PAC PT "6  Clicks" Mobility   Outcome Measure  Help needed turning from your back to your side while in a flat bed without using bedrails?: A Lot Help needed moving from lying on your back to sitting on the side of a flat bed without using bedrails?: A Lot Help needed moving to and from a bed to a chair (including a wheelchair)?: Total Help needed standing up from a chair using your arms (e.g., wheelchair or bedside chair)?: A Lot Help needed to walk in hospital room?: Total Help needed climbing 3-5 steps with a railing? 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

## 2023-11-09 DIAGNOSIS — S065XAA Traumatic subdural hemorrhage with loss of consciousness status unknown, initial encounter: Secondary | ICD-10-CM | POA: Diagnosis not present

## 2023-11-09 LAB — GLUCOSE, CAPILLARY
Glucose-Capillary: 141 mg/dL — ABNORMAL HIGH (ref 70–99)
Glucose-Capillary: 142 mg/dL — ABNORMAL HIGH (ref 70–99)
Glucose-Capillary: 152 mg/dL — ABNORMAL HIGH (ref 70–99)
Glucose-Capillary: 167 mg/dL — ABNORMAL HIGH (ref 70–99)
Glucose-Capillary: 208 mg/dL — ABNORMAL HIGH (ref 70–99)
Glucose-Capillary: 230 mg/dL — ABNORMAL HIGH (ref 70–99)

## 2023-11-09 LAB — COMPREHENSIVE METABOLIC PANEL WITH GFR
ALT: 61 U/L — ABNORMAL HIGH (ref 0–44)
AST: 66 U/L — ABNORMAL HIGH (ref 15–41)
Albumin: 2.7 g/dL — ABNORMAL LOW (ref 3.5–5.0)
Alkaline Phosphatase: 248 U/L — ABNORMAL HIGH (ref 38–126)
Anion gap: 11 (ref 5–15)
BUN: 15 mg/dL (ref 8–23)
CO2: 21 mmol/L — ABNORMAL LOW (ref 22–32)
Calcium: 8.6 mg/dL — ABNORMAL LOW (ref 8.9–10.3)
Chloride: 104 mmol/L (ref 98–111)
Creatinine, Ser: 0.44 mg/dL (ref 0.44–1.00)
GFR, Estimated: >60 mL/min (ref >60)
Glucose, Bld: 191 mg/dL — ABNORMAL HIGH (ref 70–99)
Potassium: 3.8 mmol/L (ref 3.5–5.1)
Sodium: 136 mmol/L (ref 135–145)
Total Bilirubin: 0.7 mg/dL (ref 0.0–1.2)
Total Protein: 6.8 g/dL (ref 6.5–8.1)

## 2023-11-09 LAB — CBC WITH DIFFERENTIAL/PLATELET
Abs Immature Granulocytes: 0.05 10*3/uL (ref 0.00–0.07)
Basophils Absolute: 0 10*3/uL (ref 0.0–0.1)
Basophils Relative: 1 %
Eosinophils Absolute: 0.3 10*3/uL (ref 0.0–0.5)
Eosinophils Relative: 4 %
HCT: 31.5 % — ABNORMAL LOW (ref 36.0–46.0)
Hemoglobin: 10.2 g/dL — ABNORMAL LOW (ref 12.0–15.0)
Immature Granulocytes: 1 %
Lymphocytes Relative: 22 %
Lymphs Abs: 1.5 10*3/uL (ref 0.7–4.0)
MCH: 32.4 pg (ref 26.0–34.0)
MCHC: 32.4 g/dL (ref 30.0–36.0)
MCV: 100 fL (ref 80.0–100.0)
Monocytes Absolute: 0.7 10*3/uL (ref 0.1–1.0)
Monocytes Relative: 10 %
Neutro Abs: 4.1 10*3/uL (ref 1.7–7.7)
Neutrophils Relative %: 62 %
Platelets: 259 10*3/uL (ref 150–400)
RBC: 3.15 MIL/uL — ABNORMAL LOW (ref 3.87–5.11)
RDW: 14.7 % (ref 11.5–15.5)
WBC: 6.5 10*3/uL (ref 4.0–10.5)
nRBC: 0 % (ref 0.0–0.2)

## 2023-11-09 LAB — BRAIN NATRIURETIC PEPTIDE: B Natriuretic Peptide: 129.4 pg/mL — ABNORMAL HIGH (ref 0.0–100.0)

## 2023-11-09 LAB — AMMONIA: Ammonia: <13 umol/L (ref 9–35)

## 2023-11-09 LAB — PROTIME-INR
INR: 1.1 (ref 0.8–1.2)
Prothrombin Time: 14.5 s (ref 11.4–15.2)

## 2023-11-09 LAB — PHOSPHORUS: Phosphorus: 3.1 mg/dL (ref 2.5–4.6)

## 2023-11-09 LAB — MAGNESIUM: Magnesium: 1.9 mg/dL (ref 1.7–2.4)

## 2023-11-09 MED ORDER — LOPERAMIDE HCL 1 MG/7.5ML PO SUSP
2.0000 mg | Freq: Once | ORAL | Status: AC
  Administered 2023-11-09: 2 mg
  Filled 2023-11-09: qty 15

## 2023-11-09 NOTE — Progress Notes (Signed)
 This chaplain responded to the unit's consult for creating/updating the Pt. Advance Directive. The Pt. is sleeping at the time of the visit. The Pt. significant other-Chet is at the bedside.   The chaplain will revisit with the Pt. and Chet on Thursday. The chaplain left the blank AD document at the Pt. bedside.  This chaplain is available for F/U spiritual care as needed.  Chaplain Kathleene Papas [phone]

## 2023-11-09 NOTE — Progress Notes (Signed)
 Occupational Therapy Treatment Patient Details Name: Miranda Cochran MRN: [id number] DOB: 03-31-1951 Today's Date: 11/09/2023   History of present illness Pt is a 73 y.o. female presenting after fall 5/14. CT with occipital skull fracture, traumatic subarachnoid hemorrhage and right subdural hematoma. Repeat CT increased size of R subdural hematoma. Intubated 5/15-5/23 secondary to respiratory failure secondary to E. coli, Streptococcus HAP. EEG 5/16 with cortical dysfunction arising from R hemisphere, moderate-severe diffuse encephalopathy. EEG 5/17 with seizure activity averaging 4 seizures per hour lasting 1 min each; resolved with adjustment to medications. RLE DVT 5/19 and IVC filter placed. PMH significant for autoimmune hepatitis with cirrhosis of liver, HTN, depression, GERD, HLD, DM, IBS, osteoporosis, thyroid  nodule, vascular disease, vitamin D deficiency.   OT comments  Pt c/o pain to back/bottom during activities and at rest, sacral/perineal sores noted, painful during hygiene. Pt husband present during session and very supportive. Pt lethargic at beginning of session, took several minutes to become more alert, eventually able to fully participate. Pt assisted to EOB wwith max A, poor sitting balance, max cueing for hand placement to hold onto rails and maintain sitting balance. Pt able to maintain sitting balance without support <20% of the time. Pt with L side weakness, difficulty with BL ADLs, unable to don/doff socks, max A for LB dressing. Pt restless with constant leaning and reaching/grabbing.  Pt assisted at bed level for toileting and hygiene, max A. Pt given yellow therabands to B hand rails and instructed on BUEs to improve strength, squeeze ball given. Pt demos fair RUE strength to complete exercises, difficulty with AROM to L side, but able to perform 4 repetitions with L side. Husband supportive and educated on HEP. Will continue to follow acutely to progress as able, postacute  intensive rehab >3hrs/day still appropriate, Pt tolerated session well.       If plan is discharge home, recommend the following:  Two people to help with walking and/or transfers;Two people to help with bathing/dressing/bathroom;Assistance with cooking/housework;Help with stairs or ramp for entrance;Assist for transportation;Direct supervision/assist for medications management;Direct supervision/assist for financial management;Assistance with feeding   Equipment Recommendations  Other (comment) (defer)    Recommendations for Other Services      Precautions / Restrictions Precautions Precautions: Fall;Other (comment) Recall of Precautions/Restrictions: Impaired Precaution/Restrictions Comments: Prior compression fxs, no formal precautions ordered. Cortrax Restrictions Weight Bearing Restrictions Per Provider Order: No       Mobility Bed Mobility Overal bed mobility: Needs Assistance Bed Mobility: Supine to Sit, Sit to Supine, Rolling Rolling: Mod assist, Used rails   Supine to sit: Max assist, HOB elevated, Used rails Sit to supine: Max assist, HOB elevated, Used rails   General bed mobility comments: max A for supine to sit and back, poor core strength, decreased safety awareness.    Transfers Overall transfer level: Needs assistance                 General transfer comment: not attempted     Balance Overall balance assessment: Needs assistance Sitting-balance support: Bilateral upper extremity supported, Feet supported Sitting balance-Leahy Scale: Poor Sitting balance - Comments: max A for support to maintain balance, Pt is able to hold herself up inconsistently with posterior/left lean                                   ADL either performed or assessed with clinical judgement   ADL Overall ADL's : Needs assistance/impaired  Eating/Feeding: NPO               Upper Body Dressing : Moderate assistance;Bed level;Cueing for sequencing    Lower Body Dressing: Maximal assistance;Sitting/lateral leans;Cueing for sequencing                 General ADL Comments: bed level/sitting EOB for ADLs, poor sitting balance, restless, cueing for safety/sequencing    Extremity/Trunk Assessment Upper Extremity Assessment Upper Extremity Assessment: Generalized weakness;LUE deficits/detail LUE Deficits / Details: decreased L sides awareness LUE Sensation: decreased light touch;decreased proprioception LUE Coordination: decreased gross motor;decreased fine motor            Vision       Perception     Praxis     Communication Communication Communication: Impaired Factors Affecting Communication: Difficulty expressing self;Reduced clarity of speech   Cognition Arousal: Alert Behavior During Therapy: Flat affect, Restless Cognition: Cognition impaired             OT - Cognition Comments: Pt not able to respond to questions, but husband reports she was talking ot him all day, Pt began talking more as I was leaving room. Pt follows simple commands ~75% of times, increased time.                 Following commands: Impaired Following commands impaired: Follows one step commands inconsistently, Follows one step commands with increased time      Cueing   Cueing Techniques: Verbal cues, Gestural cues, Tactile cues, Visual cues  Exercises      Shoulder Instructions       General Comments      Pertinent Vitals/ Pain       Pain Assessment Pain Assessment: Faces Faces Pain Scale: Hurts even more Pain Location: generalized, back, sacral sores Pain Descriptors / Indicators: Discomfort, Grimacing Pain Intervention(s): Monitored during session  Home Living                                          Prior Functioning/Environment              Frequency  Min 2X/week        Progress Toward Goals  OT Goals(current goals can now be found in the care plan section)  Progress towards  OT goals: Progressing toward goals  Acute Rehab OT Goals Patient Stated Goal: to improve strength OT Goal Formulation: With patient/family Time For Goal Achievement: 11/19/23 Potential to Achieve Goals: Good ADL Goals Pt Will Perform Grooming: with min assist;sitting Pt Will Perform Upper Body Bathing: with min assist;sitting Pt Will Perform Lower Body Bathing: with mod assist;sitting/lateral leans;sit to/from stand Pt Will Transfer to Toilet: with mod assist;stand pivot transfer Pt/caregiver will Perform Home Exercise Program: Left upper extremity;Increased ROM;Increased strength Additional ADL Goal #1: Pt will follow one step commands 100% of time with min cues  Plan      Co-evaluation                 AM-PAC OT "6 Clicks" Daily Activity     Outcome Measure   Help from another person eating meals?: Total Help from another person taking care of personal grooming?: A Lot Help from another person toileting, which includes using toliet, bedpan, or urinal?: A Lot Help from another person bathing (including washing, rinsing, drying)?: A Lot Help from another person to put on and taking off regular upper  body clothing?: A Lot Help from another person to put on and taking off regular lower body clothing?: A Lot 6 Click Score: 11    End of Session    OT Visit Diagnosis: Unsteadiness on feet (R26.81);Muscle weakness (generalized) (M62.81);Repeated falls (R29.6);Other abnormalities of gait and mobility (R26.89);History of falling (Z91.81);Low vision, both eyes (H54.2);Feeding difficulties (R63.3);Other symptoms and signs involving cognitive function;Cognitive communication deficit (R41.841);Hemiplegia and hemiparesis Symptoms and signs involving cognitive functions: Nontraumatic SAH Hemiplegia - Right/Left: Left Hemiplegia - dominant/non-dominant: Non-Dominant Hemiplegia - caused by: Nontraumatic SAH   Activity Tolerance Patient tolerated treatment well   Patient Left in  bed;with call bell/phone within reach;with bed alarm set;with family/visitor present   Nurse Communication Mobility status        Time: 2952-8413 OT Time Calculation (min): 43 min  Charges: OT General Charges $OT Visit: 1 Visit OT Treatments $Self Care/Home Management : 23-37 mins $Therapeutic Activity: 8-22 mins  Kyson Kupper, OTR/L   Navi Ewton R Huda Petrey 11/09/2023, 3:41 PM

## 2023-11-09 NOTE — Progress Notes (Signed)
 Inpatient Rehab Admissions Coordinator:   Pt progressing well with therapy.  At this time we are recommending a CIR consult and I will place an order per our protocol.   Loye Rumble, PT, DPT Admissions Coordinator [phone] 11/09/23  4:15 PM

## 2023-11-09 NOTE — Plan of Care (Signed)
  Problem: Education: Goal: Knowledge of the prescribed therapeutic regimen Outcome: Progressing Goal: Knowledge of disease or condition will improve Outcome: Progressing   Problem: Clinical Measurements: Goal: Neurologic status will improve Outcome: Progressing

## 2023-11-09 NOTE — Progress Notes (Signed)
 Speech Language Pathology Treatment: Dysphagia  Patient Details Name: Miranda Cochran MRN: [id number] DOB: [birth year] Today's Date: 11/09/2023 Time: 4098-1191 SLP Time Calculation (min) (ACUTE ONLY): 11 min  Assessment / Plan / Recommendation Clinical Impression  Pt alert, responsive to questions in clear vocal quality, decreased intensity, denied odonophagia. Pt allowed oral care which did not reveal dried secretions today. She accepted cups sips thin water with immediate cough and concern for laryngeal intrusion. Trials applesauce with mild delays in swallow initiation without s/s aspiration. She appears ready for instrumental evaluation with MBS which will be completed tomorrow. Pt can have ice chips after oral care.    HPI HPI: Pt is a 73 y.o. female presenting after fall 5/14. CT with occipital skull fracture, traumatic subarachnoid hemorrhage and right subdural hematoma. Repeat CT increased size of R subdural hematoma. Intubated 5/15-5/23 secondary to respiratory failure secondary to E. coli, Streptococcus HAP. EEG 5/16 with cortical dysfunction arising from R hemisphere, moderate-severe diffuse encephalopathy. EEG 5/17 with seizure activity averaging 4 seizures per hour lasting 1 min each; resolved with adjustment to medications. RLE DVT 5/19 and IVC filter placed. PMH significant for autoimmune hepatitis with cirrhosis of liver, HTN, depression, GERD, HLD, DM, IBS, osteoporosis, thyroid  nodule, vascular disease, vitamin D deficiency.      SLP Plan  Continue with current plan of care      Recommendations for follow up therapy are one component of a multi-disciplinary discharge planning process, led by the attending physician.  Recommendations may be updated based on patient status, additional functional criteria and insurance authorization.    Recommendations  Diet recommendations: NPO Medication Administration: Via alternative means                  Oral care QID   Frequent or  constant Supervision/Assistance Dysphagia, oropharyngeal phase (R13.12)     Continue with current plan of care     Miranda Cochran  11/09/2023, 9:17 AM

## 2023-11-09 NOTE — Progress Notes (Signed)
 PROGRESS NOTE                                                                                                                                                                                                             Patient Demographics:    Miranda Cochran, is a 73 y.o. 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, chronically on prednisone , hypertension, depression, DM type II, GAVE syndrome causing GI bleed, GERD, hiatal hernia, thyroid  nodule, PAD who presented to the hospital on 10/26/2023 after multiple falls at home and head injuries, she developed decrease in her mental status while in the hospital on 10/27/2023 the next day of admission, head CT was consistent with TBI with traumatic subdural hematoma, subarachnoid hemorrhage and right temporal contusion MRI also showed small acute infarcts in bilateral cerebral hemispheres right more than left, she was subsequently seen by the PCCM team, she was intubated, she was also found to have seizures subsequently for which she received Keppra .  She developed expressive aphasia and dysphagia for which she is on NG tube feeds, finally she was extubated on 11/04/2023 and transferred to my service on 11/07/2023.  Of note she is still extremely weak and frail, minimally responsive, dense left-sided hemiparesis, still has NG tube and Flexi-Seal.  Has been seen by [facility], neurology and palliative care.   Significant Hospital Events: Including procedures, antibiotic start and stop dates in addition to other pertinent events   5/14 admit 5/15 AMS, CT head consistent with TBI with subdural hematoma, subarachnoid  hemorrhage, right temporal contusion along with MRI suggestive of bilateral acute infarcts left more than right. 5/16 Remains on minimal vent settings, hypoglycemic episode this AM, originally following commands overnight. Repeat CT Scan shows mild worsening of bleeding 5/17 Seizures on EEG.  Given additional dose of Keppra , Keppra  dose increased and loaded with phenobarb. CT head with stable extensive multifocal posttraumatic ICH, no MLS, basilar cisterns remain patent, stable nondisplaced left posterior skull fx 5/18 EEG neg. Sedation off, tolerating SBT 8/5 but  not waking up yet 5/19 LE duplex with R DVT, had IVC filter placed as AC not cleared by neurosurgery. CTA chest neg for PE but showed LLL PNA 5/22 On precedex, tolerating SBT, no further sz EEG 5/23 Extubated  5/24 Weak cough with tachypnea, at risk for inability to protect airway  5/26.  Transferred to [facility] under my care on day 11 of her hospital stay, currently has NG tube, unresponsive, Flexi-Seal 5/27 - TTE -  1. Left ventricular ejection fraction, by estimation, is 60 to 65%. The left ventricle has normal function. The left ventricle has no regional wall motion abnormalities. There is moderate concentric left ventricular hypertrophy. Left ventricular diastolic parameters are consistent with Grade I diastolic dysfunction (impaired relaxation).  2. Right ventricular systolic function is normal. The right ventricular size is normal. There is normal pulmonary artery systolic pressure. The estimated right ventricular systolic pressure is 25.3 mmHg.  3. The mitral valve is normal in structure. Trivial mitral valve regurgitation. No evidence of mitral stenosis.  4. The aortic valve is tricuspid. There is severe calcifcation of the aortic valve. Aortic valve regurgitation is not visualized. Moderate to severe aortic valve stenosis. Aortic valve mean gradient measures 26.0 mmHg, AVA 0.96 cm^2.  5. The inferior vena cava is normal in size with greater  than 50% respiratory variability, suggesting right atrial pressure of 3 mmHg.     Subjective:   Patient in bed, appears comfortable, no headache, no fever, no chest pain or shortness of breath, no focal neurodeficits    Assessment  & Plan :   Severe acute encephalopathy, left-sided hemiparesis, dysphagia, expressive aphasia, caused by multiple falls at home causing traumatic brain injury, subdural hematoma, subarachnoid hematoma, right temporal contusion, MRI also suggestive of bilateral infarcts left cerebral hemisphere more than right.  Complicated by seizures.  - Still has mild left-sided weakness, expressive aphasia and  dysphagia, on NG tube feeds, extremely weak and frail, continue Keppra  for seizures, aspirin  for infarcts, seen by neurology, continue supportive care, may require PEG tube if dysphagia does not improve, may require placement long-term.  Continue PT, OT and speech eval.  Clinically marginally improved on 11/08/2023.  Neurology has requested Zio patch evaluation after discharge, with outpatient neurology follow-up, question how it would change management since she is not a candidate for anticoagulation due to her multiple falls and traumatic brain injury related subdural and subarachnoid hemorrhages.   - She remains on tube feed, followed closely by SLP, remains n.p.o.    Acute hypoxic respiratory failure due to hospital-acquired pneumonia - sputum culture 5/17 with E.coli and Strep pneumoniae -Able to be successfully extubated 5/24 but remains high risk for respiratory decompensation given poor cough and tachypnea -S/p 7 days antibiotics last dose 11/04/2023. -Continue to monitor closely, still has NG tube, still has severe encephalopathy with dysphagia and expressive aphasia, repeat aspiration risk very high.    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 Not cleared for anticoagulation by neurosurgery. CTA neg for PE. P: IVC filter remains in place, Risk of anticoagulation outweighs benefit given chronic falls    SVT run 11/07/23 - broke with adenosine , scheduled Lopressor, IVF, EKG, TTE noted, TSH stable, cards input appreciated now on B Blocker + Amiodarone .  H/O AS moderate to severe - per Cards, avoid preload drops  HX of alcohol abuse ??.  No DTs.  Hyperglycemia P: SSI CBG goal 140-180 CBG checks every 4  Lab Results  Component Value Date   HGBA1C 6.8 (H) 10/27/2023   CBG (last 3)  Recent Labs    11/09/23 0037 11/09/23 0326 11/09/23 0819  GLUCAP 208* 167* 142*         Condition - Extremely Guarded  Family Communication  :   None at bedside  Code Status : Full code  Consults  : Neurology, NS - Dr Ellery Guthrie, PCCM, Cards, palliative care  PUD Prophylaxis : PPI   Procedures  :            Disposition Plan  :    Status is: Inpatient  DVT Prophylaxis  :    Place and maintain sequential compression device Start: 10/27/23 1110 SCDs Start: 10/27/23 0329    Lab Results  Component Value Date   PLT 259 11/09/2023    Diet :  Diet Order             Diet NPO time specified  Diet effective now                    Inpatient Medications  Scheduled Meds:  amiodarone  200 mg Per Tube Daily   aspirin   81 mg Per Tube Daily   Or   aspirin   300 mg Rectal Daily   Chlorhexidine  Gluconate Cloth  6 each Topical Daily   cyanocobalamin   500 mcg Per Tube Daily   ezetimibe   10 mg Per Tube Daily   feeding supplement (PROSource TF20)  60 mL Per Tube Daily   fiber supplement (BANATROL TF)  60 mL Per Tube BID   free water  200 mL Per Tube Q8H   insulin  aspart  0-20 Units Subcutaneous Q4H   insulin  aspart  8 Units Subcutaneous Q4H   insulin  glargine-yfgn  25 Units Subcutaneous Daily   lactulose  30 g Per Tube BID   levETIRAcetam   1,000 mg Per Tube BID   metoprolol tartrate  25 mg Per Tube  Q8H   multivitamin with minerals  1 tablet Per Tube Daily   mouth rinse  15 mL Mouth Rinse 4 times per day   pantoprazole  (PROTONIX ) IV  40 mg Intravenous QHS   predniSONE   10 mg Per Tube Q breakfast   thiamine   100 mg Per Tube Daily   Continuous Infusions:  feeding supplement (OSMOLITE 1.5 CAL) 1,000 mL (11/08/23 1756)   PRN Meds:.acetaminophen  (TYLENOL ) oral liquid 160 mg/5 mL, diltiazem, hydrALAZINE , mouth rinse, traMADol  Antibiotics  :    Anti-infectives (From admission, onward)    Start     Dose/Rate Route Frequency Ordered Stop   11/01/23 1400  cefTRIAXone  (ROCEPHIN ) 2 g in sodium chloride  0.9 % 100 mL IVPB        2 g 200 mL/hr over 30 Minutes Intravenous Every 24 hours 11/01/23 1025 11/04/23 1511   10/29/23 0845  Ampicillin -Sulbactam (UNASYN ) 3 g in sodium chloride  0.9 % 100 mL IVPB  Status:  Discontinued        3 g 200 mL/hr over 30 Minutes Intravenous Every 6 hours 10/29/23 0753 11/01/23 1025         Objective:   Vitals:   11/08/23 2329 11/09/23 0322 11/09/23 0500 11/09/23 0800  BP: 131/80 (!) 124/56    Pulse: 91 98  98  Resp: 20 (!) 24  20  Temp: 98 F (36.7 C) 98 F (36.7 C)  97.9 F (36.6 C)  TempSrc: Oral Oral  Axillary  SpO2: 98%     Weight:   52.6 kg   Height:        Wt Readings from Last 3 Encounters:  11/09/23 52.6 kg  08/29/23 56.7 kg  04/22/23 61.1 kg    No intake or output data in the 24 hours ending 11/09/23 1200    Physical Exam   Awake Alert, frail, no new focal deficits, normal affect Symmetrical Chest wall movement, Good air movement bilaterally, CTAB RRR,No Gallops,Rubs or new Murmurs, No Parasternal Heave +ve B.Sounds, Abd Soft, No tenderness, No rebound - guarding or rigidity. No Cyanosis, Clubbing or edema, No new Rash or bruise       Data Review:    Recent Labs  Lab 11/05/23 0603 11/06/23 0506 11/07/23 0455 11/08/23 0425 11/09/23 0437  WBC 15.6* 12.3* 9.2 7.5 6.5  HGB 11.6* 10.2* 10.5* 9.4* 10.2*  HCT 36.0  32.0* 32.9* 29.7* 31.5*  PLT 225 231 226 227 259  MCV 98.6 99.4 101.5* 100.7* 100.0  MCH 31.8 31.7 32.4 31.9 32.4  MCHC 32.2 31.9 31.9 31.6 32.4  RDW 14.6 14.8 14.9 14.7 14.7  LYMPHSABS  --   --  1.6 1.6 1.5  MONOABS  --   --  0.7 0.7 0.7  EOSABS  --   --  0.3 0.3 0.3  BASOSABS  --   --  0.1 0.0 0.0    Recent Labs  Lab 11/05/23 0603 11/06/23 0506 11/07/23 0455 11/07/23 0818 11/08/23 0425 11/09/23 0437  NA 134* 135 136  --  135 136  K 4.1 3.4* 4.2  --  3.8 3.8  CL 100 105 103  --  107 104  CO2 20* 21* 19*  --  19* 21*  ANIONGAP 14 9 14   --  9 11  GLUCOSE 173* 237* 139*  --  201* 191*  BUN 15 19 20   --  15 15  CREATININE 0.51 0.54 0.45  --  0.48 0.44  AST  --   --   --   --  79* 66*  ALT  --   --   --   --  62* 61*  ALKPHOS  --   --   --   --  224* 248*  BILITOT  --   --   --   --  0.7 0.7  ALBUMIN  --   --   --   --  2.4* 2.7*  INR  --   --   --   --   --  1.1  TSH  --   --  4.013  --   --   --   AMMONIA  --   --   --  70*  --  <13  BNP  --   --   --  50.7 146.8* 129.4*  MG 2.0 2.0 2.0  --  2.0 1.9  PHOS 3.3 3.1 3.4  --  4.0 3.1  CALCIUM  8.3* 8.3* 8.7*  --  7.9* 8.6*      Recent Labs  Lab 11/05/23 0603 11/06/23 0506 11/07/23 0455 11/07/23 0818 11/08/23 0425 11/09/23 0437  INR  --   --   --   --   --  1.1  TSH  --   --  4.013  --   --   --   AMMONIA  --   --   --  70*  --  <13  BNP  --   --   --  50.7 146.8* 129.4*  MG 2.0 2.0 2.0  --  2.0 1.9  CALCIUM  8.3* 8.3* 8.7*  --  7.9* 8.6*    --------------------------------------------------------------------------------------------------------------- Lab Results  Component Value Date   CHOL 182 11/05/2023   HDL 39 (L) 11/05/2023   LDLCALC 119 (H) 11/05/2023   LDLDIRECT 133.0 09/15/2021   TRIG 119 11/05/2023   CHOLHDL 4.7 11/05/2023    Lab Results  Component Value Date   HGBA1C 6.8 (H) 10/27/2023   Recent Labs    11/07/23 0455  TSH 4.013  FREET4 0.95    Radiology Report ECHOCARDIOGRAM  COMPLETE Result Date: 11/07/2023    ECHOCARDIOGRAM REPORT   Patient Name:   Miranda Cochran Date of Exam: 11/07/2023 Medical Rec #:  [id number]     Height:       64.0 in Accession #:    [id number]    Weight:       124.6 lb Date of Birth:  [birth year]    BSA:          1.600 m Patient Age:    72 years      BP:           117/71 mmHg Patient Gender: F             HR:           89 bpm. Exam Location:  Inpatient Procedure: 2D Echo, Cardiac Doppler and Color Doppler (Both Spectral and Color            Flow Doppler were utilized during procedure). Indications:    Abnormal ECG R94.31  History:        Patient has prior history of Echocardiogram examinations, most                 recent 10/27/2023.  Sonographer:    Terrilee Few RCS Referring Phys: Florie Husband Andrez Banker [facility]  Sonographer Comments: Image acquisition challenging due to uncooperative patient. IMPRESSIONS  1. Left ventricular ejection fraction, by estimation, is 60 to 65%. The left ventricle has normal function. The left ventricle has no regional wall motion abnormalities. There is moderate concentric left ventricular hypertrophy. Left ventricular diastolic parameters are consistent with Grade I diastolic dysfunction (impaired relaxation).  2. Right ventricular systolic function is normal. The right ventricular size is normal. There is normal pulmonary artery systolic pressure. The estimated right ventricular systolic pressure is 25.3 mmHg.  3. The mitral valve is normal in structure. Trivial mitral valve regurgitation. No evidence of mitral stenosis.  4. The aortic valve is tricuspid. There is severe calcifcation of the aortic valve. Aortic valve regurgitation is not visualized. Moderate to severe aortic valve stenosis. Aortic valve mean gradient measures 26.0 mmHg, AVA 0.96 cm^2.  5. The inferior vena cava is normal in size with greater than 50% respiratory variability, suggesting right atrial pressure of 3 mmHg. FINDINGS  Left Ventricle: Left ventricular ejection  fraction, by estimation, is 60 to 65%. The left ventricle has normal function. The left ventricle has no regional wall motion abnormalities. The left ventricular internal cavity size was normal in size. There is  moderate concentric left ventricular hypertrophy. Left ventricular diastolic parameters are consistent with Grade I diastolic dysfunction (impaired relaxation). Right Ventricle: The right ventricular size is normal. No increase in right ventricular wall thickness. Right ventricular systolic function is normal. There is normal pulmonary artery systolic pressure. The tricuspid regurgitant velocity is 2.36 m/s, and  with an assumed right atrial pressure of 3 mmHg, the estimated right ventricular systolic pressure is 25.3 mmHg.  Left [facility]: Left atrial size was normal in size. Right [facility]: Right atrial size was normal in size. Pericardium: There is no evidence of pericardial effusion. Mitral Valve: The mitral valve is normal in structure. Trivial mitral valve regurgitation. No evidence of mitral valve stenosis. Tricuspid Valve: The tricuspid valve is normal in structure. Tricuspid valve regurgitation is trivial. Aortic Valve: The aortic valve is tricuspid. There is severe calcifcation of the aortic valve. Aortic valve regurgitation is not visualized. Moderate to severe aortic stenosis is present. Aortic valve mean gradient measures 26.0 mmHg. Aortic valve peak gradient measures 39.4 mmHg. Aortic valve area, by VTI measures 1.08 cm. Pulmonic Valve: The pulmonic valve was normal in structure. Pulmonic valve regurgitation is not visualized. Aorta: The aortic root is normal in size and structure. Venous: The inferior vena cava is normal in size with greater than 50% respiratory variability, suggesting right atrial pressure of 3 mmHg. IAS/Shunts: No atrial level shunt detected by color flow Doppler.  LEFT VENTRICLE PLAX 2D LVIDd:         2.60 cm     Diastology LV PW:         1.20 cm     LV e' medial:    6.53 cm/s  LV IVS:        1.40 cm     LV E/e' medial:  11.7 LVOT diam:     2.10 cm     LV e' lateral:   6.53 cm/s LV SV:         55          LV E/e' lateral: 11.7 LV SV Index:   34 LVOT Area:     3.46 cm  LV Volumes (MOD) LV vol d, MOD A2C: 47.6 ml LV vol d, MOD A4C: 46.9 ml LV vol s, MOD A2C: 20.5 ml LV vol s, MOD A4C: 18.6 ml LV SV MOD A2C:     27.1 ml LV SV MOD A4C:     46.9 ml LV SV MOD BP:      30.3 ml RIGHT VENTRICLE             IVC RV S prime:     19.30 cm/s  IVC diam: 1.60 cm TAPSE (M-mode): 2.0 cm LEFT ATRIUM             Index        RIGHT ATRIUM           Index LA diam:        2.80 cm 1.75 cm/m   RA Area:     11.00 cm LA Vol (A2C):   26.3 ml 16.44 ml/m  RA Volume:   23.30 ml  14.56 ml/m LA Vol (A4C):   30.8 ml 19.25 ml/m LA Biplane Vol: 31.0 ml 19.38 ml/m  AORTIC VALVE AV Area (Vmax):    1.14 cm AV Area (Vmean):   1.09 cm AV Area (VTI):     1.08 cm AV Vmax:           314.00 cm/s AV Vmean:          217.333 cm/s AV VTI:            0.507 m AV Peak Grad:      39.4 mmHg AV Mean Grad:      26.0 mmHg LVOT Vmax:         103.00 cm/s LVOT Vmean:        68.300 cm/s LVOT VTI:  0.158 m LVOT/AV VTI ratio: 0.31  AORTA Ao Root diam: 3.40 cm Ao Asc diam:  3.60 cm MITRAL VALVE                TRICUSPID VALVE MV Area (PHT): 5.31 cm     TR Peak grad:   22.3 mmHg MV Decel Time: 143 msec     TR Vmax:        236.00 cm/s MV E velocity: 76.20 cm/s MV A velocity: 109.00 cm/s  SHUNTS MV E/A ratio:  0.70         Systemic VTI:  0.16 m                             Systemic Diam: 2.10 cm Dalton McleanMD Electronically signed by Archer Bear Signature Date/Time: 11/07/2023/1:37:34 PM    Final      Signature  -   Seena Dadds M.D on 11/09/2023 at 12:00 PM   -  To page go to [email or web address]

## 2023-11-10 ENCOUNTER — Inpatient Hospital Stay (HOSPITAL_COMMUNITY)

## 2023-11-10 DIAGNOSIS — S065XAA Traumatic subdural hemorrhage with loss of consciousness status unknown, initial encounter: Secondary | ICD-10-CM | POA: Diagnosis not present

## 2023-11-10 LAB — COMPREHENSIVE METABOLIC PANEL WITH GFR
ALT: 54 U/L — ABNORMAL HIGH (ref 0–44)
AST: 57 U/L — ABNORMAL HIGH (ref 15–41)
Albumin: 2.7 g/dL — ABNORMAL LOW (ref 3.5–5.0)
Alkaline Phosphatase: 248 U/L — ABNORMAL HIGH (ref 38–126)
Anion gap: 8 (ref 5–15)
BUN: 15 mg/dL (ref 8–23)
CO2: 20 mmol/L — ABNORMAL LOW (ref 22–32)
Calcium: 8.3 mg/dL — ABNORMAL LOW (ref 8.9–10.3)
Chloride: 106 mmol/L (ref 98–111)
Creatinine, Ser: 0.51 mg/dL (ref 0.44–1.00)
GFR, Estimated: >60 mL/min (ref >60)
Glucose, Bld: 308 mg/dL — ABNORMAL HIGH (ref 70–99)
Potassium: 4.1 mmol/L (ref 3.5–5.1)
Sodium: 134 mmol/L — ABNORMAL LOW (ref 135–145)
Total Bilirubin: 0.8 mg/dL (ref 0.0–1.2)
Total Protein: 6.9 g/dL (ref 6.5–8.1)

## 2023-11-10 LAB — CBC WITH DIFFERENTIAL/PLATELET
Abs Immature Granulocytes: 0.06 10*3/uL (ref 0.00–0.07)
Basophils Absolute: 0 10*3/uL (ref 0.0–0.1)
Basophils Relative: 1 %
Eosinophils Absolute: 0.3 10*3/uL (ref 0.0–0.5)
Eosinophils Relative: 4 %
HCT: 32.8 % — ABNORMAL LOW (ref 36.0–46.0)
Hemoglobin: 10.6 g/dL — ABNORMAL LOW (ref 12.0–15.0)
Immature Granulocytes: 1 %
Lymphocytes Relative: 23 %
Lymphs Abs: 1.5 10*3/uL (ref 0.7–4.0)
MCH: 32.1 pg (ref 26.0–34.0)
MCHC: 32.3 g/dL (ref 30.0–36.0)
MCV: 99.4 fL (ref 80.0–100.0)
Monocytes Absolute: 0.7 10*3/uL (ref 0.1–1.0)
Monocytes Relative: 11 %
Neutro Abs: 4 10*3/uL (ref 1.7–7.7)
Neutrophils Relative %: 60 %
Platelets: 267 10*3/uL (ref 150–400)
RBC: 3.3 MIL/uL — ABNORMAL LOW (ref 3.87–5.11)
RDW: 14.7 % (ref 11.5–15.5)
WBC: 6.6 10*3/uL (ref 4.0–10.5)
nRBC: 0 % (ref 0.0–0.2)

## 2023-11-10 LAB — GLUCOSE, CAPILLARY
Glucose-Capillary: 97 mg/dL (ref 70–99)
Glucose-Capillary: 235 mg/dL — ABNORMAL HIGH (ref 70–99)
Glucose-Capillary: 226 mg/dL — ABNORMAL HIGH (ref 70–99)
Glucose-Capillary: 188 mg/dL — ABNORMAL HIGH (ref 70–99)
Glucose-Capillary: 203 mg/dL — ABNORMAL HIGH (ref 70–99)
Glucose-Capillary: 251 mg/dL — ABNORMAL HIGH (ref 70–99)

## 2023-11-10 LAB — MAGNESIUM: Magnesium: 2 mg/dL (ref 1.7–2.4)

## 2023-11-10 LAB — PHOSPHORUS: Phosphorus: 3.3 mg/dL (ref 2.5–4.6)

## 2023-11-10 LAB — BRAIN NATRIURETIC PEPTIDE: B Natriuretic Peptide: 84.6 pg/mL (ref 0.0–100.0)

## 2023-11-10 MED ORDER — BARRIER CREAM NON-SPECIFIED: 1.0000 | TOPICAL_CREAM | Freq: Two times a day (BID) | TOPICAL | Status: DC | PRN

## 2023-11-10 MED ORDER — GERHARDT'S BUTT CREAM
TOPICAL_CREAM | Freq: Two times a day (BID) | CUTANEOUS | Status: DC | PRN
  Filled 2023-11-10: qty 60

## 2023-11-10 NOTE — TOC Progression Note (Signed)
 Transition of Care [facility]) - Progression Note    Patient Details  Name: Miranda Cochran MRN: [id number] Date of Birth: 11-16-1950  Transition of Care [facility]) CM/SW Contact  Jannice Mends, LCSW Phone Number: 11/10/2023, 8:51 AM  Clinical Narrative:    CSW continuing to follow for SNF. Patient still with cortrak.     Barriers to Discharge: Continued Medical Work up  Expected Discharge Plan and Services                                               Social Determinants of Health (SDOH) Interventions SDOH Screenings   Food Insecurity: No Food Insecurity (04/21/2023)  Housing: Low Risk  (04/21/2023)  Transportation Needs: No Transportation Needs (04/21/2023)  Utilities: Low Risk  (03/21/2023)   Received from [facility]  Alcohol Screen: Low Risk  (04/21/2023)  Depression (PHQ2-9): Low Risk  (08/29/2023)  Financial Resource Strain: Low Risk  (04/21/2023)  Physical Activity: Insufficiently Active (04/21/2023)  Social Connections: Moderately Integrated (04/21/2023)  Stress: Stress Concern Present (04/21/2023)  Tobacco Use: Low Risk  (10/27/2023)    Readmission Risk Interventions     No data to display

## 2023-11-10 NOTE — Progress Notes (Signed)
 Modified Barium Swallow Study  Patient Details  Name: Miranda Cochran MRN: [id number] Date of Birth: 1951/01/01  Today's Date: 11/10/2023  Modified Barium Swallow completed.  Full report located under Chart Review in the Imaging Section.  History of Present Illness Pt is a 73 y.o. female presenting after fall 5/14. CT with occipital skull fracture, traumatic subarachnoid hemorrhage and right subdural hematoma. Repeat CT increased size of R subdural hematoma. Intubated 5/15-5/23 secondary to respiratory failure secondary to E. coli, Streptococcus HAP. EEG 5/16 with cortical dysfunction arising from R hemisphere, moderate-severe diffuse encephalopathy. EEG 5/17 with seizure activity averaging 4 seizures per hour lasting 1 min each; resolved with adjustment to medications. RLE DVT 5/19 and IVC filter placed. PMH significant for autoimmune hepatitis with cirrhosis of liver, HTN, depression, GERD, HLD, DM, IBS, osteoporosis, thyroid  nodule, vascular disease, vitamin D deficiency.   Clinical Impression Pt demonstrated a DIGEST score of 2 indicating moderate oropharyngeal dysphagia. Pt had excessive movements, decreased sustained attention and needed cueing throughout study. There was decreased labial closure with anterior spill, trace lingual residue and slow mastication of solid in part due to decreased attention. There was incomplete glottal closure, partial anterior hyoid excursion and partial epiglottic inversion resulting in penetration with thin to the cords that pt sensed and cleared throat to clear penetrates. Chin tuck strategy was ineffective. Nectar thick penetrated to cords with minimal remaining in vestibule. Laryngeal penetration with honey thick to vocal cords that pt sensed and spontaneous throat clear cleared vestibule. Pharyngeal residue was mild in valleculae and pyriform sinuses. Recommend puree texture, honey thick liquids, intermittent throat clear, crush pills and full assist/supervision  with po's. Factors that may increase risk of adverse event in presence of aspiration Roderick Civatte & Jessy Morocco 2021): Reduced cognitive function DIGEST Swallow Severity Rating*  Safety:   Efficiency:  Overall Pharyngeal Swallow Severity:  1: mild; 2: moderate; 3: severe; 4: profound  *The Dynamic Imaging Grade of Swallowing Toxicity is standardized for the head and neck cancer population, however, demonstrates promising clinical applications across populations to standardize the clinical rating of pharyngeal swallow safety and severity.   Swallow Evaluation Recommendations Recommendations: PO diet PO Diet Recommendation: Moderately thick liquids (Level 3, honey thick);Dysphagia 1 (Pureed) Liquid Administration via: Cup Medication Administration: Crushed with puree Supervision: Staff to assist with self-feeding;Full supervision/cueing for swallowing strategies Swallowing strategies  : Slow rate;Small bites/sips;Clear throat intermittently Postural changes: Position pt fully upright for meals Oral care recommendations: Oral care BID (2x/day)      [name] 11/10/2023,3:50 PM

## 2023-11-10 NOTE — Inpatient Diabetes Management (Signed)
 Inpatient Diabetes Program Recommendations  AACE/ADA: New Consensus Statement on Inpatient Glycemic Control (2015)  Target Ranges:  Prepandial:   less than 140 mg/dL      Peak postprandial:   less than 180 mg/dL (1-2 hours)      Critically ill patients:  140 - 180 mg/dL   Lab Results  Component Value Date   GLUCAP 226 (H) 11/10/2023   HGBA1C 6.8 (H) 10/27/2023    Review of Glycemic Control  Latest Reference Range & Units 11/09/23 08:19 11/09/23 13:01 11/09/23 16:34 11/09/23 19:26 11/10/23 01:05 11/10/23 04:27 11/10/23 08:24  Glucose-Capillary 70 - 99 mg/dL 409 (H)  Novolog  11 units  Semglee  25 units 230 (H)  Novolog  15 units 152 (H)  Novolog  12 units 141 (H)  Novolog  11 units given at 2100 97  Novolog  8 units 235 (H)  Novolog  15 units 226 (H)  Novolog  15 units Given at 1014  Semglee  25 units    Diabetes history: DM 2 Outpatient Diabetes medications: Humalog  18-22 units Daily at lunch, Rybelsus 14 mg Daily Current orders for Inpatient glycemic control:  Semglee  25 units Daily Novolog  8 units Q4 Tube Feed Coverage Novolog  0-20 units Q4 hours PO prednisone  10 mg Daily Osmolite 45 ml/hour  Inpatient Diabetes Program Recommendations:    -   Increase Novolog  Tube Feed Coverage to 11 units -   Decrease Novolog  Correction scale to 0-15 units Q4 hours  Watch trends for now.  Thanks,  Eloise Hake RN, MSN, BC-ADM Inpatient Diabetes Coordinator Team Pager [phone] (8a-5p)

## 2023-11-10 NOTE — Progress Notes (Signed)
 Inpatient Rehab Coordinator Note:  I met with patient and her spouse, Chet, at bedside to discuss CIR recommendations and goals/expectations of CIR stay.  We reviewed 3 hrs/day of therapy, physician follow up, and average length of stay 2 weeks (dependent upon progress) with goals of 24/7 supervision to min assist.  Ines Mane is in the process of getting FMLA with start date when pt ready to come home.  He can provide 24/7 physical assist at discharge.  I confirmed [employer] as primary payor through [employer] employer, and reviewed need for prior auth.  I will send that request today.    Loye Rumble, PT, DPT Admissions Coordinator [phone] 11/10/23  12:59 PM

## 2023-11-10 NOTE — Progress Notes (Signed)
 This chaplain is present for F/U spiritual care in the setting of creating the Pt. Advance Directive. The Pt. spouse-Chet is at the bedside at the time of the visit.   The Pt. is awake and humorous during the visit. The chaplain listened reflectively as the Pt. became more serious as she recalls the stroke and the patience required in healing. The chaplain learned the Pt. is leaning on her faith and her goal of regaining her independence.  The Pt. identified her spouse-Chet Tuccillo as her Runner, broadcasting/film/video.  Plans were made to F/U on AD at another time.   This chaplain is available for F/U spiritual care as needed.  Chaplain Kathleene Papas [phone]

## 2023-11-10 NOTE — Progress Notes (Signed)
 PROGRESS NOTE                                                                                                                                                                                                             Patient Demographics:    Miranda Cochran, is a 73 y.o. 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 , hypertension, depression, DM type II, GAVE syndrome causing GI bleed, GERD, hiatal hernia, thyroid  nodule, PAD who presented to the hospital on 10/26/2023 after multiple falls at home and head injuries, she developed decrease in her mental status while in the hospital on 10/27/2023 the next day of admission, head CT was consistent with TBI with traumatic subdural hematoma, subarachnoid hemorrhage and right temporal contusion MRI also showed small acute infarcts in bilateral cerebral hemispheres right more than left, she was subsequently seen by the PCCM team, she was intubated, she was also found to have seizures subsequently for which she received Keppra .  She developed expressive aphasia and dysphagia for which she is on NG tube feeds, finally she was extubated on 11/04/2023 and transferred to my service on 11/07/2023.  Of note she is still extremely weak and frail, minimally responsive, dense left-sided hemiparesis, still has NG tube and Flexi-Seal.  Has been seen by [facility], neurology and palliative care.   Significant Hospital Events: Including procedures, antibiotic start and stop dates in addition to other pertinent events   5/14 admit 5/15 AMS, CT head consistent with TBI with subdural hematoma, subarachnoid  hemorrhage, right temporal contusion along with MRI suggestive of bilateral acute infarcts left more than right. 5/16 Remains on minimal vent settings, hypoglycemic episode this AM, originally following commands overnight. Repeat CT Scan shows mild worsening of bleeding 5/17 Seizures on EEG.  Given additional dose of Keppra , Keppra  dose increased and loaded with phenobarb. CT head with stable extensive multifocal posttraumatic ICH, no MLS, basilar cisterns remain patent, stable nondisplaced left posterior skull fx 5/18 EEG neg. Sedation off, tolerating SBT 8/5  but not waking up yet 5/19 LE duplex with R DVT, had IVC filter placed as AC not cleared by neurosurgery. CTA chest neg for PE but showed LLL PNA 5/22 On precedex, tolerating SBT, no further sz EEG 5/23 Extubated  5/24 Weak cough with tachypnea, at risk for inability to protect airway  5/26.  Transferred to [facility] under my care on day 11 of her hospital stay, currently has NG tube, unresponsive, Flexi-Seal 5/27 - TTE -  1. Left ventricular ejection fraction, by estimation, is 60 to 65%. The left ventricle has normal function. The left ventricle has no regional wall motion abnormalities. There is moderate concentric left ventricular hypertrophy. Left ventricular diastolic parameters are consistent with Grade I diastolic dysfunction (impaired relaxation).  2. Right ventricular systolic function is normal. The right ventricular size is normal. There is normal pulmonary artery systolic pressure. The estimated right ventricular systolic pressure is 25.3 mmHg.  3. The mitral valve is normal in structure. Trivial mitral valve regurgitation. No evidence of mitral stenosis.  4. The aortic valve is tricuspid. There is severe calcifcation of the aortic valve. Aortic valve regurgitation is not visualized. Moderate to severe aortic valve stenosis. Aortic valve mean gradient measures 26.0 mmHg, AVA 0.96 cm^2.  5. The inferior vena cava is normal in size with greater  than 50% respiratory variability, suggesting right atrial pressure of 3 mmHg.     Subjective:   Patient in bed, appears comfortable, no headache, no fever, no chest pain or shortness of breath, no focal neurodeficits    Assessment  & Plan :   Traumatic subdural hemorrhage  Post traumatic IPH, multifocal Right temporal contusion Severe acute encephalopathy -Admission has severe acute encephalopathy, left-sided hemiparesis, dysphagia, expressive aphasia, caused by multiple falls at home causing traumatic brain injury, subdural hematoma, subarachnoid hematoma, right temporal contusion, MRI also suggestive of bilateral infarcts left cerebral hemisphere more than right.  Complicated by seizures. - has left-sided weakness, expressive aphasia and  dysphagia, on NG tube feeds, overall she continues to improve, mentation, speech and weakness much improved, continue with aggressive physical therapy as appears to be benefiting from it at this point.   -Neurology has requested Zio patch evaluation after discharge, - Followed closely by SLP, she remains n.p.o. after their evaluation yesterday, so for now continue with core Trak tube feed, to allow time for recovery as he has been gradually improving, and hoping in few days she will be able to pass her swallow evaluation and avoid PEG.   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 -Continue to monitor closely, still has NG tube, still has severe encephalopathy with dysphagia and expressive aphasia, repeat aspiration risk very high.    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 Not cleared for anticoagulation by neurosurgery. CTA neg for PE. P: IVC filter remains in place, Risk of anticoagulation outweighs benefit given chronic falls    SVT run 11/07/23 - broke with adenosine , scheduled Lopressor, IVF, EKG, TTE noted, TSH stable, cards input appreciated now on  B Blocker + Amiodarone  .  H/O AS moderate to severe - per Cards, avoid preload drops  HX of alcohol abuse ??.  No DTs.    Hyperglycemia P: Continue with insulin  sliding scale, and prescheduled NovoLog  every 4 hours and Semglee  as long she is on tube feed.  Lab Results  Component Value Date   HGBA1C 6.8 (H) 10/27/2023   CBG (last 3)  Recent Labs    11/10/23 0427 11/10/23 0824 11/10/23 1137  GLUCAP 235* 226* 188*         Condition - Extremely Guarded  Family Communication  :   discussed with husband at bedside  Code Status : Full code  Consults  : Neurology, NS - Dr Ellery Guthrie, PCCM, Cards, palliative care  PUD Prophylaxis : PPI   Procedures  :            Disposition Plan  :    Status is: Inpatient  DVT Prophylaxis  :    Place and maintain sequential compression device Start: 10/27/23 1110 SCDs Start: 10/27/23 0329    Lab Results  Component Value Date   PLT 267 11/10/2023    Diet :  Diet Order             Diet NPO time specified  Diet effective now                    Inpatient Medications  Scheduled Meds:  amiodarone   200 mg Per Tube Daily   aspirin   81 mg Per Tube Daily   Or   aspirin   300 mg Rectal Daily   Chlorhexidine  Gluconate Cloth  6 each Topical Daily   cyanocobalamin   500 mcg Per Tube Daily   ezetimibe   10 mg Per Tube Daily   feeding supplement (PROSource TF20)  60 mL Per Tube Daily   fiber supplement (BANATROL TF)  60 mL Per Tube BID   free water   200 mL Per Tube Q8H   insulin   aspart  0-20 Units Subcutaneous Q4H   insulin  aspart  8 Units Subcutaneous Q4H   insulin  glargine-yfgn  25 Units Subcutaneous Daily   levETIRAcetam   1,000 mg Per Tube BID   metoprolol  tartrate  25 mg Per Tube Q8H   multivitamin with minerals  1 tablet Per Tube Daily   mouth rinse  15 mL Mouth Rinse 4 times per day   pantoprazole  (PROTONIX ) IV  40 mg Intravenous QHS   predniSONE   10 mg Per Tube Q breakfast   thiamine   100 mg Per Tube Daily   Continuous Infusions:  feeding supplement (OSMOLITE 1.5 CAL) 1,000 mL (11/10/23 1041)   PRN Meds:.acetaminophen  (TYLENOL ) oral liquid 160 mg/5 mL, diltiazem , hydrALAZINE , mouth rinse, traMADol   Antibiotics  :    Anti-infectives (From admission, onward)    Start     Dose/Rate Route Frequency Ordered Stop   11/01/23 1400  cefTRIAXone  (ROCEPHIN ) 2 g in sodium chloride  0.9 % 100 mL IVPB        2 g 200 mL/hr over 30 Minutes Intravenous Every 24 hours 11/01/23 1025 11/04/23 1511   10/29/23 0845  Ampicillin -Sulbactam (UNASYN ) 3 g in sodium chloride  0.9 % 100 mL IVPB  Status:  Discontinued        3 g 200 mL/hr over 30 Minutes Intravenous Every 6 hours 10/29/23 0753 11/01/23 1025         Objective:   Vitals:   11/10/23 0816 11/10/23 0828 11/10/23 1141 11/10/23 1200  BP: 97/71 113/74  108/71  Pulse:  88 100   Resp:    20  Temp: 97.6 F (36.4 C) 97.8 F (36.6 C) 97.6 F (36.4 C)   TempSrc: Axillary Oral Oral   SpO2:      Weight:      Height:        Wt Readings from Last 3 Encounters:  11/10/23 54.4 kg  08/29/23 56.7 kg  04/22/23 61.1 kg     Intake/Output Summary (Last 24 hours) at 11/10/2023 1303 Last data filed at 11/10/2023 0600 Gross per 24 hour  Intake 0 ml  Output 551 ml  Net -551 ml      Physical Exam   Awake Alert, frail, no new focal deficits, normal affect Symmetrical Chest wall movement, Good air movement bilaterally, CTAB RRR,No Gallops,Rubs or new Murmurs, No Parasternal Heave +ve B.Sounds, Abd Soft, No  tenderness, No rebound - guarding or rigidity. No Cyanosis, Clubbing or edema, No new Rash or bruise        Data Review:    Recent Labs  Lab 11/06/23 0506 11/07/23 0455 11/08/23 0425 11/09/23 0437 11/10/23 0556  WBC 12.3* 9.2 7.5 6.5 6.6  HGB 10.2* 10.5* 9.4* 10.2* 10.6*  HCT 32.0* 32.9* 29.7* 31.5* 32.8*  PLT 231 226 227 259 267  MCV 99.4 101.5* 100.7* 100.0 99.4  MCH 31.7 32.4 31.9 32.4 32.1  MCHC 31.9 31.9 31.6 32.4 32.3  RDW 14.8 14.9 14.7 14.7 14.7  LYMPHSABS  --  1.6 1.6 1.5 1.5  MONOABS  --  0.7 0.7 0.7 0.7  EOSABS  --  0.3 0.3 0.3 0.3  BASOSABS  --  0.1 0.0 0.0 0.0    Recent Labs  Lab 11/06/23 0506 11/07/23 0455 11/07/23 0818 11/08/23 0425 11/09/23 0437 11/10/23 0556  NA 135 136  --  135 136 134*  K 3.4* 4.2  --  3.8 3.8 4.1  CL 105 103  --  107 104 106  CO2 21* 19*  --  19* 21* 20*  ANIONGAP 9 14  --  9 11 8   GLUCOSE 237* 139*  --  201* 191* 308*  BUN 19 20  --  15 15 15   CREATININE 0.54 0.45  --  0.48 0.44 0.51  AST  --   --   --  79* 66* 57*  ALT  --   --   --  62* 61* 54*  ALKPHOS  --   --   --  224* 248* 248*  BILITOT  --   --   --  0.7 0.7 0.8  ALBUMIN  --   --   --  2.4* 2.7* 2.7*  INR  --   --   --   --  1.1  --   TSH  --  4.013  --   --   --   --   AMMONIA  --   --  70*  --  <13  --   BNP  --   --  50.7 146.8* 129.4* 84.6  MG 2.0 2.0  --  2.0 1.9 2.0  PHOS 3.1 3.4  --  4.0 3.1 3.3  CALCIUM  8.3* 8.7*  --  7.9* 8.6* 8.3*      Recent Labs  Lab 11/06/23 0506 11/07/23 0455 11/07/23 0818 11/08/23 0425 11/09/23 0437 11/10/23 0556  INR  --   --   --   --  1.1  --   TSH  --  4.013  --   --   --   --  AMMONIA  --   --  70*  --  <13  --   BNP  --   --  50.7 146.8* 129.4* 84.6  MG 2.0 2.0  --  2.0 1.9 2.0  CALCIUM  8.3* 8.7*  --  7.9* 8.6* 8.3*    --------------------------------------------------------------------------------------------------------------- Lab Results  Component Value Date   CHOL 182 11/05/2023   HDL 39 (L)  11/05/2023   LDLCALC 119 (H) 11/05/2023   LDLDIRECT 133.0 09/15/2021   TRIG 119 11/05/2023   CHOLHDL 4.7 11/05/2023    Lab Results  Component Value Date   HGBA1C 6.8 (H) 10/27/2023   No results for input(s): "TSH", "T4TOTAL", "FREET4", "T3FREE", "THYROIDAB" in the last 72 hours.   Radiology Report No results found.    Signature  -   Seena Dadds M.D on 11/10/2023 at 1:03 PM   -  To page go to [email or web address]

## 2023-11-10 NOTE — Plan of Care (Signed)
 Pt has rested quietly throughout the night with no distress noted. Alert and oriented to person, place and situation. On room air. Cortrak intact with TF at 45 with water flush. Purewick on at times to suction but pt pulls it off frequently. Incontinent of urine. Moves all over bed. Throws legs against siderails. Medicated once for back pain with relief noted. Husband at bedside.     Problem: Clinical Measurements: Goal: Neurologic status will improve Outcome: Progressing   Problem: Nutritional: Goal: Risk of aspiration will decrease Outcome: Progressing   Problem: Cardiovascular: Goal: Ability to achieve and maintain adequate cardiovascular perfusion will improve Outcome: Progressing   Problem: Ischemic Stroke/TIA Tissue Perfusion: Goal: Complications of ischemic stroke/TIA will be minimized Outcome: Progressing

## 2023-11-10 NOTE — PMR Pre-admission (Signed)
 PMR Admission Coordinator Pre-Admission Assessment  Patient: Miranda Cochran is an 73 y.o., female MRN: [id number] DOB: [birth year] Height: 5\' 4"  (162.6 cm) Weight: 54.4 kg  Insurance Information HMO: ***    PPO: ***     PCP:      IPA:      80/20:      OTHER:  PRIMARY: [employer]      Policy#: [id number]      Subscriber: pt's spouse Chet CM Name: ***      Phone#: ***     Fax#: *** Pre-Cert#: ***      Employer: *** Benefits:  Phone #: ***     Name: *** Venson Ginger. Date: ***     Deduct: ***      Out of Pocket Max: ***      Life Max: *** CIR: ***      SNF: *** Outpatient: ***     Co-Pay: *** Home Health: ***      Co-Pay: *** DME: ***     Co-Pay: *** Providers:  SECONDARY: Medicare Part A only      Policy#: [id number]     Phone#:   Financial Counselor:       Phone#:   The Engineer, materials Information Summary" for patients in Inpatient Rehabilitation Facilities with attached "Privacy Act Statement-Health Care Records" was provided and verbally reviewed with: Patient and Family  Emergency Contact Information Contact Information     Name Relation Home Work Mobile   [place] Spouse [phone]  [phone]   Jacinta Martinis Niece   [phone]      Other Contacts   None on File     Current Medical History  Patient Admitting Diagnosis: CVA   History of Present Illness: Pt is a 73 y/o female with PMH of autoimmune hepatitis with cirrhosis, HTN, depression, DM, IBS, osteoporosis, vascular disease, who was admitted to [facility] on 5/14 following a fall at home.  EMS noted occipital hematoma and scalp lac with emesis x1 prior to arrival.  In ED, labs showed hypokalemia with prolonged QTc on EKG, WBC 16.5, SBP >200.  Imaging revealed SDH/SAH, as well as occipital skull fracture. 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 Consults to neurosurgery, PCCM.  Serial head CTs show stable/slow improvement.  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  She is currently NPO due to dysphagia and with a cortrack.  Hospital course alk phos trending up requiring close monitoring of LFTs, RLE DVT ,with IVC filter placed on 5/19 (not a candidate for anticoagulation given falls/TBI), and SVT run on 5/26 which was managed with medication.  Therapy ongoing and pt was recommended for CIR.   Complete NIHSS TOTAL: 7  Patient's medical record from [facility] has been reviewed by the rehabilitation admission coordinator and physician.  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 No  Family History   family history includes Brain cancer in her maternal uncle and paternal grandmother; Breast cancer in her paternal grandmother; Colon cancer (age of onset: 39) in her father; Colon polyps (age of onset: 45) in her father; Esophageal cancer in her maternal grandfather; Heart disease in her sister; Hypertension in her  maternal grandmother and mother; Liver cancer (age of onset: 67) in her father; Lung cancer in her paternal grandfather; Pancreatic cancer in her maternal aunt; Stroke in her maternal grandmother and mother; Throat cancer in her maternal grandfather.  Current Medications  Current Facility-Administered Medications:    acetaminophen  (TYLENOL ) 160 MG/5ML solution 650 mg, 650 mg, Per Tube, Q6H PRN, Desai, Nikita S, MD, 650 mg at 11/07/23 4098   amiodarone  (PACERONE ) tablet 200 mg, 200 mg, Per Tube, Daily, Nishan, Peter C, MD, 200 mg at 11/10/23 1015   aspirin  chewable tablet 81 mg, 81 mg, Per Tube, Daily, 81 mg at 11/10/23 1016 **OR** aspirin  suppository 300 mg, 300 mg, Rectal, Daily, Desai, Nikita S, MD   Chlorhexidine  Gluconate Cloth 2 % PADS 6 each, 6 each, Topical, Daily, Desai, Nikita S, MD, 6 each at 11/10/23 1017   cyanocobalamin  (VITAMIN B12) tablet 500 mcg, 500 mcg, Per Tube, Daily, Desai, Nikita S, MD, 500 mcg at 11/10/23 1015   diltiazem  (CARDIZEM ) injection 10 mg, 10 mg, Intravenous, Q6H PRN, Singh, Prashant K, MD   ezetimibe  (ZETIA ) tablet 10 mg, 10 mg, Per Tube, Daily, Desai, Nikita S, MD, 10 mg at 11/10/23 1015   feeding supplement (OSMOLITE 1.5 CAL) liquid 1,000 mL, 1,000 mL, Per Tube, Continuous, Aleck Hurdle, MD, Last Rate: 45 mL/hr at 11/10/23 1041, 1,000 mL at 11/10/23 1041   feeding supplement (PROSource TF20) liquid 60 mL, 60 mL, Per Tube, Daily, Desai, Nikita S, MD, 60 mL at 11/10/23 1013   fiber supplement (BANATROL TF) liquid 60 mL, 60 mL, Per Tube, BID, Desai, Nikita S, MD, 60 mL at 11/10/23 1013   free water  200 mL, 200 mL, Per Tube, Q8H, Lynnwood Sauer K, MD, 200 mL at 11/10/23 0526   hydrALAZINE  (APRESOLINE ) injection 10 mg, 10 mg, Intravenous, Q4H PRN, Desai, Nikita S, MD, 10 mg at 11/01/23 1230   insulin  aspart (novoLOG ) injection 0-20 Units, 0-20 Units, Subcutaneous, Q4H, Desai, Nikita S, MD, 7 Units at 11/10/23 1013   insulin  aspart (novoLOG ) injection 8 Units, 8  Units, Subcutaneous, Q4H, Desai, Nikita S, MD, 8 Units at 11/10/23 1014   insulin  glargine-yfgn (SEMGLEE ) injection 25 Units, 25 Units, Subcutaneous, Daily, Desai, Nikita S, MD, 25 Units at 11/10/23 1016   levETIRAcetam  (KEPPRA ) tablet 1,000 mg, 1,000 mg, Per Tube, BID, Desai, Nikita S, MD, 1,000 mg at 11/10/23 1015   metoprolol  tartrate (LOPRESSOR ) tablet 25 mg, 25 mg, Per Tube, Q8H, Nishan, Peter C, MD, 25 mg at 11/10/23 0526   multivitamin with minerals  tablet 1 tablet, 1 tablet, Per Tube, Daily, Desai, Nikita S, MD, 1 tablet at 11/10/23 1015   Oral care mouth rinse, 15 mL, Mouth Rinse, 4 times per day, Aleck Hurdle, MD, 15 mL at 11/10/23 0800   Oral care mouth rinse, 15 mL, Mouth Rinse, PRN, Desai, Nikita S, MD   pantoprazole  (PROTONIX ) injection 40 mg, 40 mg, Intravenous, QHS, Desai, Nikita S, MD, 40 mg at 11/09/23 2137   predniSONE  (DELTASONE ) tablet 10 mg, 10 mg, Per Tube, Q breakfast, Desai, Nikita S, MD, 10 mg at 11/10/23 1016   thiamine  (VITAMIN B1) tablet 100 mg, 100 mg, Per Tube, Daily, Desai, Nikita S, MD, 100 mg at 11/10/23 1015   traMADol (ULTRAM) tablet 50 mg, 50 mg, Per NG tube, Q12H PRN, Singh, Prashant K, MD, 50 mg at 11/10/23 2841  Patients Current Diet:  Diet Order             Diet NPO time specified  Diet effective now                   Precautions / Restrictions Precautions Precautions: Fall, Other (comment) Precaution/Restrictions Comments: Prior compression fxs, no formal precautions ordered. Cortrax Restrictions Weight Bearing Restrictions Per Provider Order: No   Has the patient had 2 or more falls or a fall with injury in the past year? Yes  Prior Activity Level Community (5-7x/wk): independent, occasionally using SPC 2/2 fall several months ago with vertebral fractures, driving, had not returned to work since fall  Prior Functional Level Self Care: Did the patient need help bathing, dressing, using the toilet or eating? Independent  Indoor  Mobility: Did the patient need assistance with walking from room to room (with or without device)? Independent  Stairs: Did the patient need assistance with internal or external stairs (with or without device)? Independent  Functional Cognition: Did the patient need help planning regular tasks such as shopping or remembering to take medications? Independent  Patient Information Are you of Hispanic, Latino/a,or Spanish origin?: A. No, not of Hispanic, Latino/a, or Spanish origin What is your race?: A. White Do you need or want an interpreter to communicate with a doctor or health care staff?: 0. No  Patient's Response To:  Health Literacy and Transportation Is the patient able to respond to health literacy and transportation needs?: Yes Health Literacy - How often do you need to have someone help you when you read instructions, pamphlets, or other written material from your doctor or pharmacy?: Never In the past 12 months, has lack of transportation kept you from medical appointments or from getting medications?: No In the past 12 months, has lack of transportation kept you from meetings, work, or from getting things needed for daily living?: No  Home Assistive Devices / Equipment Home Equipment: Cane - quad  Prior Device Use: Indicate devices/aids used by the patient prior to current illness, exacerbation or injury? Occasional Cane  Current Functional Level Cognition  Orientation Level: Oriented to person    Extremity Assessment (includes Sensation/Coordination)  Upper Extremity Assessment: Generalized weakness, LUE deficits/detail RUE Deficits / Details: generally weak, spouse says she brushed teeth earlier today LUE Deficits / Details: decreased L sides awareness LUE Sensation: decreased light touch, decreased proprioception LUE Coordination: decreased gross motor, decreased fine motor  Lower Extremity Assessment: RLE deficits/detail, LLE deficits/detail, Difficult to assess due  to impaired cognition RLE Deficits / Details: pt with persistant kicking of RLE through session. does so to command and then continued through session. unable  to sustain for MMT and not following commands for toes/ankle for MMT. unable to communicate regardin sensation. no clonus noted LLE Deficits / Details: pt attempted to wiggle toes x3 to command, no attempt to move at ankle. did follow command to "kick both legs" with trace activation of left quad x3. not able to answer questions regarding sensation    ADLs  Overall ADL's : Needs assistance/impaired Eating/Feeding: NPO Grooming: Oral care, Maximal assistance, Sitting Grooming Details (indicate cue type and reason): needing assist for normalized movement patterns of RUE for repetitive back and forth motion (able to initiate with increased time and cues but not sustain, for reducing undershooting/overshooting bringing toothbrush to mouth, and asisst for sitting balance. Upper Body Bathing: Maximal assistance, Sitting Lower Body Bathing: Total assistance, Sit to/from stand Upper Body Dressing : Moderate assistance, Bed level, Cueing for sequencing Upper Body Dressing Details (indicate cue type and reason): max due to restlessness/pt pulling at IV Lower Body Dressing: Maximal assistance, Sitting/lateral leans, Cueing for sequencing Lower Body Dressing Details (indicate cue type and reason): donned R sock with max A from bed level; total A for L Toilet Transfer: Total assistance, +2 for physical assistance, +2 for safety/equipment Toilet Transfer Details (indicate cue type and reason): for STS Toileting- Clothing Manipulation and Hygiene: +2 for physical assistance, +2 for safety/equipment, Bed level Toileting - Clothing Manipulation Details (indicate cue type and reason): found to have purewick failure (likely due to pt restless in bed) and incontinent of BM. +2 for rolling and linen change with pericare Functional mobility during ADLs: Total  assistance General ADL Comments: bed level/sitting EOB for ADLs, poor sitting balance, restless, cueing for safety/sequencing    Mobility  Overal bed mobility: Needs Assistance Bed Mobility: Supine to Sit, Sit to Supine, Rolling Rolling: Mod assist, Used rails Sidelying to sit: Max assist, HOB elevated Supine to sit: Max assist, HOB elevated, Used rails Sit to supine: Max assist, HOB elevated, Used rails General bed mobility comments: max A for supine to sit and back, poor core strength, decreased safety awareness.    Transfers  Overall transfer level: Needs assistance Equipment used: 1 person hand held assist, Ambulation equipment used Transfers: Sit to/from Stand, Bed to chair/wheelchair/BSC Sit to Stand: Mod assist Bed to/from chair/wheelchair/BSC transfer type:: Via Lift equipment Transfer via Lift Equipment: Stedy General transfer comment: not attempted    Ambulation / Gait / Stairs / Psychologist, prison and probation services  Ambulation/Gait General Gait Details: unable at this time    Posture / Balance Dynamic Sitting Balance Sitting balance - Comments: max A for support to maintain balance, Pt is able to hold herself up inconsistently with posterior/left lean Balance Overall balance assessment: Needs assistance Sitting-balance support: Bilateral upper extremity supported, Feet supported Sitting balance-Leahy Scale: Poor Sitting balance - Comments: max A for support to maintain balance, Pt is able to hold herself up inconsistently with posterior/left lean Postural control: Posterior lean, Right lateral lean, Left lateral lean Standing balance support: Bilateral upper extremity supported Standing balance-Leahy Scale: Poor Standing balance comment: NT    Special needs/care consideration Continuous Drip IV  tube feeds, Skin skin tears to bilateral elbows, and Diabetic management yes   Previous Home Environment (from acute therapy documentation) Living Arrangements: Spouse/significant  other Available Help at Discharge: Family Type of Home: Apartment Home Layout: One level Home Access: Level entry Bathroom Shower/Tub: Engineer, manufacturing systems: Standard Home Care Services: Yes  Discharge Living Setting Plans for Discharge Living Setting: Patient's home, Lives with (comment) (spouse) Type of Home  at Discharge: Apartment Discharge Home Layout: One level Discharge Home Access: Level entry Discharge Bathroom Shower/Tub: Tub/shower unit Discharge Bathroom Toilet: Standard Discharge Bathroom Accessibility: Yes How Accessible: Accessible via walker Does the patient have any problems obtaining your medications?: No  Social/Family/Support Systems Patient Roles: Spouse Anticipated Caregiver: spouse, Rosemary Mossbarger Anticipated Caregiver's Contact Information: [phone] Ability/Limitations of Caregiver: none stated, will take FMLA Caregiver Availability: 24/7 Discharge Plan Discussed with Primary Caregiver: Yes Is Caregiver In Agreement with Plan?: Yes Does Caregiver/Family have Issues with Lodging/Transportation while Pt is in Rehab?: No  Goals Patient/Family Goal for Rehab: PT/OT supervision to min assist, SLP min assist Expected length of stay: 14-18 days Additional Information: Discharge plan: home with spouse who can take FMLA to provide 24/7 supervision/assist.  Confirmed ability to provide physical assist if needed Pt/Family Agrees to Admission and willing to participate: Yes Program Orientation Provided & Reviewed with Pt/Caregiver Including Roles  & Responsibilities: Yes  Decrease burden of Care through IP rehab admission: n/a  Possible need for SNF placement upon discharge:  Not anticipated.  Plan for d/c home with spouse who can provide 24/7 supervision/physical assist  Patient Condition: I have reviewed medical records from [facility], spoken with CM, and patient and spouse. I met with patient at the bedside for inpatient rehabilitation assessment.   Patient will benefit from ongoing PT, OT, and SLP, can actively participate in 3 hours of therapy a day 5 days of the week, and can make measurable gains during the admission.  Patient will also benefit from the coordinated team approach during an Inpatient Acute Rehabilitation admission.  The patient will receive intensive therapy as well as Rehabilitation physician, nursing, social worker, and care management interventions.  Due to bladder management, bowel management, safety, skin/wound care, disease management, medication administration, pain management, and patient education the patient requires 24 hour a day rehabilitation nursing.  The patient is currently mod assist to max assist with mobility and basic ADLs.  Discharge setting and therapy post discharge at home with home health is anticipated.  Patient has agreed to participate in the Acute Inpatient Rehabilitation Program and will admit pending insurance approval ***.  Preadmission Screen Completed By:  Mickey Alar, PT, DPT 11/10/2023 1:06 PM ______________________________________________________________________   Discussed status with Dr. Aaron Aas on *** at *** and received approval for admission today.  Admission Coordinator:  Caitlin E Warren, PT, time Aaron AasAlanna Hu ***   Assessment/Plan: Diagnosis: *** Does the need for close, 24 hr/day Medical supervision in concert with the patient's rehab needs make it unreasonable for this patient to be served in a less intensive setting? {yes_no_potentially:3041433} Co-Morbidities requiring supervision/potential complications: *** Due to {due UJ:8119147}, does the patient require 24 hr/day rehab nursing? {yes_no_potentially:3041433} Does the patient require coordinated care of a physician, rehab nurse, PT, OT, and SLP to address physical and functional deficits in the context of the above medical diagnosis(es)? {yes_no_potentially:3041433} Addressing deficits in the following areas: {deficits:3041436} Can  the patient actively participate in an intensive therapy program of at least 3 hrs of therapy 5 days a week? {yes_no_potentially:3041433} The potential for patient to make measurable gains while on inpatient rehab is {potential:3041437} Anticipated functional outcomes upon discharge from inpatient rehab: {functional outcomes:304600100} PT, {functional outcomes:304600100} OT, {functional outcomes:304600100} SLP Estimated rehab length of stay to reach the above functional goals is: *** Anticipated discharge destination: {anticipated dc setting:21604} 10. Overall Rehab/Functional Prognosis: {potential:3041437}   MD Signature: ***

## 2023-11-10 NOTE — Progress Notes (Signed)
 Physical Therapy Treatment Patient Details Name: Miranda Cochran MRN: [id number] DOB: May 25, 1951 Today's Date: 11/10/2023   History of Present Illness Pt is a 73 y.o. female presenting after fall 5/14. CT with occipital skull fracture, traumatic subarachnoid hemorrhage and right subdural hematoma. Repeat CT increased size of R subdural hematoma. Intubated 5/15-5/23 secondary to respiratory failure secondary to E. coli, Streptococcus HAP. EEG 5/16 with cortical dysfunction arising from R hemisphere, moderate-severe diffuse encephalopathy. EEG 5/17 with seizure activity averaging 4 seizures per hour lasting 1 min each; resolved with adjustment to medications. RLE DVT 5/19 and IVC filter placed. PMH significant for autoimmune hepatitis with cirrhosis of liver, HTN, depression, GERD, HLD, DM, IBS, osteoporosis, thyroid  nodule, vascular disease, vitamin D deficiency.    PT Comments  Steady improvements towards acute functional goals. Rolls with CGA in bed, mod assist for trunk support to rise, pulling through LUE. Sits EOB with CGA, good midline control today, no physical assist to correct. Mod assist for sit to stand, and step pivot transfer to [facility], min assist to rise and pivot back to bed with multimodal cues for sequencing in each direction. Progressed with pre-gait training at [facility], and able to take several steps laterally with BIL UE support and min assist for balance, verbal cues to sequence, tactile cues for weight shift. Required assist with peri-care due to bowel incontinence. Improved cognition, conversant today, disoriented to month but aware of location, self, and year. Delayed processing, following majority of simple step commands but occasionally needing additional cues to facilitate.    If plan is discharge home, recommend the following: Two people to help with walking and/or transfers;Two people to help with bathing/dressing/bathroom;Assistance with feeding;Direct supervision/assist for  medications management;Direct supervision/assist for financial management;Assist for transportation;Help with stairs or ramp for entrance;Supervision due to cognitive status   Can travel by private vehicle        Equipment Recommendations  Wheelchair (measurements PT);Wheelchair cushion (measurements PT);Hospital bed;Hoyer lift    Recommendations for Other Services Rehab consult     Precautions / Restrictions Precautions Precautions: Fall;Other (comment) Recall of Precautions/Restrictions: Impaired Precaution/Restrictions Comments: Prior compression fxs, no formal precautions ordered. Cortrax Restrictions Weight Bearing Restrictions Per Provider Order: No     Mobility  Bed Mobility Overal bed mobility: Needs Assistance Bed Mobility: Rolling, Sidelying to Sit, Sit to Sidelying Rolling: Used rails, Contact guard assist Sidelying to sit: Mod assist     Sit to sidelying: Min assist General bed mobility comments: CGA to roll Lt and Rt several times in bed to assist with peri-care due to incontinence of stool. Mod assist for trunk support to rise to EOB, multimodal cues to facilitate and sequence. Min assist for LE support back into bed with cues for technique.    Transfers Overall transfer level: Needs assistance Equipment used: 1 person hand held assist Transfers: Sit to/from Stand, Bed to chair/wheelchair/BSC Sit to Stand: Mod assist   Step pivot transfers: Mod assist       General transfer comment: Mod assist for boost to stand from bed, followed by step pivot tranfer to [facility] with cues and bil UE support through therapist. Needs multimodal cues to sequence and align with surface prior to sitting. Min assist to stand from [facility] and step pivot transfer towards bed with improved control when directed while turning towards Rt side.    Ambulation/Gait             Pre-gait activities: Able to stand with min assist and take lateral steps along bed today  with BIL UEs supported  by therapist. LEs still with slight buckling but able to correct with min assist.     Stairs             Wheelchair Mobility     Tilt Bed    Modified Rankin (Stroke Patients Only) Modified Rankin (Stroke Patients Only) Pre-Morbid Rankin Score: No symptoms Modified Rankin: Moderately severe disability     Balance Overall balance assessment: Needs assistance Sitting-balance support: Feet supported, No upper extremity supported Sitting balance-Leahy Scale: Fair Sitting balance - Comments: Sits EOB and BSC with CGA.   Standing balance support: Bilateral upper extremity supported Standing balance-Leahy Scale: Poor Standing balance comment: NT                            Communication Communication Communication: No apparent difficulties  Cognition Arousal: Alert Behavior During Therapy: Restless   PT - Cognitive impairments: Difficult to assess, Orientation, Attention, Awareness, Initiation, Sequencing, Problem solving, Safety/Judgement Difficult to assess due to: Impaired communication Orientation impairments: Time, Situation                   PT - Cognition Comments: Follows >75% of simple one step commands with increased time. Aware of self, location, year. Unsure of month. Following commands: Impaired Following commands impaired: Follows one step commands inconsistently, Follows one step commands with increased time    Cueing Cueing Techniques: Verbal cues, Gestural cues, Tactile cues  Exercises      General Comments General comments (skin integrity, edema, etc.): BSC pinched Rt posterior thigh causing small skin tear, RN notified.      Pertinent Vitals/Pain Pain Assessment Pain Assessment: Faces Faces Pain Scale: Hurts even more Pain Location: buttocks Pain Descriptors / Indicators: Grimacing, Burning Pain Intervention(s): Limited activity within patient's tolerance, Monitored during session, Repositioned (barrier cream applied)     Home Living                          Prior Function            PT Goals (current goals can now be found in the care plan section) Acute Rehab PT Goals Patient Stated Goal: return to independence PT Goal Formulation: With patient/family Time For Goal Achievement: 11/19/23 Potential to Achieve Goals: Good Progress towards PT goals: Progressing toward goals    Frequency    Min 3X/week      PT Plan      Co-evaluation              AM-PAC PT "6 Clicks" Mobility   Outcome Measure  Help needed turning from your back to your side while in a flat bed without using bedrails?: A Little Help needed moving from lying on your back to sitting on the side of a flat bed without using bedrails?: A Lot Help needed moving to and from a bed to a chair (including a wheelchair)?: A Lot Help needed standing up from a chair using your arms (e.g., wheelchair or bedside chair)?: A Lot Help needed to walk in hospital room?: A Lot Help needed climbing 3-5 steps with a railing? : Total 6 Click Score: 12    End of Session Equipment Utilized During Treatment: Gait belt Activity Tolerance: Patient tolerated treatment well Patient left: with bed alarm set;with call bell/phone within reach;in bed Nurse Communication: Mobility status (Rt posterior thigh pinched on BSC) PT Visit Diagnosis: Unsteadiness on feet (R26.81);Muscle weakness (  generalized) (M62.81);History of falling (Z91.81);Pain;Hemiplegia and hemiparesis;Difficulty in walking, not elsewhere classified (R26.2);Other symptoms and signs involving the nervous system (R29.898);Other abnormalities of gait and mobility (R26.89) Hemiplegia - Right/Left: Left Hemiplegia - dominant/non-dominant: Non-dominant Hemiplegia - caused by:  (traumatic SDH) Pain - part of body:  (buttocks)     Time: 8295-6213 PT Time Calculation (min) (ACUTE ONLY): 31 min  Charges:    $Therapeutic Activity: 23-37 mins PT General Charges $$ ACUTE PT  VISIT: 1 Visit                     Jory Ng, PT, DPT [facility]  Rehabilitation Services Physical Therapist Office: [phone] Website: [email or web address]    Alinda Irani 11/10/2023, 3:55 PM

## 2023-11-11 ENCOUNTER — Encounter (HOSPITAL_COMMUNITY): Payer: Self-pay | Admitting: Physical Medicine and Rehabilitation

## 2023-11-11 ENCOUNTER — Inpatient Hospital Stay (HOSPITAL_COMMUNITY)
Admission: AD | Admit: 2023-11-11 | Discharge: 2023-11-13 | DRG: 945 | Disposition: A | Discharge status: Other Acute Inpatient Hospital | Source: Intra-hospital | Attending: Physical Medicine and Rehabilitation | Admitting: Physical Medicine and Rehabilitation

## 2023-11-11 ENCOUNTER — Other Ambulatory Visit: Payer: Self-pay

## 2023-11-11 DIAGNOSIS — R1310 Dysphagia, unspecified: Secondary | ICD-10-CM | POA: Diagnosis present

## 2023-11-11 DIAGNOSIS — S065XAD Traumatic subdural hemorrhage with loss of consciousness status unknown, subsequent encounter: Secondary | ICD-10-CM

## 2023-11-11 DIAGNOSIS — Z86718 Personal history of other venous thrombosis and embolism: Secondary | ICD-10-CM | POA: Diagnosis not present

## 2023-11-11 DIAGNOSIS — S065XAA Traumatic subdural hemorrhage with loss of consciousness status unknown, initial encounter: Secondary | ICD-10-CM | POA: Diagnosis not present

## 2023-11-11 DIAGNOSIS — G8194 Hemiplegia, unspecified affecting left nondominant side: Secondary | ICD-10-CM | POA: Diagnosis present

## 2023-11-11 DIAGNOSIS — S069X9S Unspecified intracranial injury with loss of consciousness of unspecified duration, sequela: Secondary | ICD-10-CM | POA: Diagnosis not present

## 2023-11-11 DIAGNOSIS — K219 Gastro-esophageal reflux disease without esophagitis: Secondary | ICD-10-CM | POA: Diagnosis present

## 2023-11-11 DIAGNOSIS — I10 Essential (primary) hypertension: Secondary | ICD-10-CM | POA: Diagnosis present

## 2023-11-11 DIAGNOSIS — E1165 Type 2 diabetes mellitus with hyperglycemia: Secondary | ICD-10-CM | POA: Diagnosis present

## 2023-11-11 DIAGNOSIS — S066XAD Traumatic subarachnoid hemorrhage with loss of consciousness status unknown, subsequent encounter: Secondary | ICD-10-CM | POA: Diagnosis present

## 2023-11-11 DIAGNOSIS — Z823 Family history of stroke: Secondary | ICD-10-CM

## 2023-11-11 DIAGNOSIS — Z888 Allergy status to other drugs, medicaments and biological substances status: Secondary | ICD-10-CM

## 2023-11-11 DIAGNOSIS — Z881 Allergy status to other antibiotic agents status: Secondary | ICD-10-CM

## 2023-11-11 DIAGNOSIS — Z95828 Presence of other vascular implants and grafts: Secondary | ICD-10-CM

## 2023-11-11 DIAGNOSIS — Z794 Long term (current) use of insulin: Secondary | ICD-10-CM | POA: Diagnosis not present

## 2023-11-11 DIAGNOSIS — S02119D Unspecified fracture of occiput, subsequent encounter for fracture with routine healing: Secondary | ICD-10-CM | POA: Diagnosis not present

## 2023-11-11 DIAGNOSIS — Z7982 Long term (current) use of aspirin: Secondary | ICD-10-CM

## 2023-11-11 DIAGNOSIS — D649 Anemia, unspecified: Secondary | ICD-10-CM | POA: Diagnosis present

## 2023-11-11 DIAGNOSIS — F1010 Alcohol abuse, uncomplicated: Secondary | ICD-10-CM | POA: Diagnosis present

## 2023-11-11 DIAGNOSIS — E041 Nontoxic single thyroid nodule: Secondary | ICD-10-CM | POA: Diagnosis present

## 2023-11-11 DIAGNOSIS — Z83719 Family history of colon polyps, unspecified: Secondary | ICD-10-CM

## 2023-11-11 DIAGNOSIS — Z801 Family history of malignant neoplasm of trachea, bronchus and lung: Secondary | ICD-10-CM

## 2023-11-11 DIAGNOSIS — W19XXXD Unspecified fall, subsequent encounter: Secondary | ICD-10-CM | POA: Diagnosis present

## 2023-11-11 DIAGNOSIS — Z880 Allergy status to penicillin: Secondary | ICD-10-CM

## 2023-11-11 DIAGNOSIS — E876 Hypokalemia: Secondary | ICD-10-CM | POA: Diagnosis present

## 2023-11-11 DIAGNOSIS — Z79899 Other long term (current) drug therapy: Secondary | ICD-10-CM | POA: Diagnosis not present

## 2023-11-11 DIAGNOSIS — E785 Hyperlipidemia, unspecified: Secondary | ICD-10-CM | POA: Diagnosis present

## 2023-11-11 DIAGNOSIS — K31819 Angiodysplasia of stomach and duodenum without bleeding: Secondary | ICD-10-CM | POA: Diagnosis present

## 2023-11-11 DIAGNOSIS — R159 Full incontinence of feces: Secondary | ICD-10-CM | POA: Diagnosis not present

## 2023-11-11 DIAGNOSIS — I82401 Acute embolism and thrombosis of unspecified deep veins of right lower extremity: Secondary | ICD-10-CM | POA: Diagnosis present

## 2023-11-11 DIAGNOSIS — K754 Autoimmune hepatitis: Secondary | ICD-10-CM | POA: Diagnosis present

## 2023-11-11 DIAGNOSIS — Z8616 Personal history of COVID-19: Secondary | ICD-10-CM | POA: Diagnosis not present

## 2023-11-11 DIAGNOSIS — G40909 Epilepsy, unspecified, not intractable, without status epilepticus: Secondary | ICD-10-CM | POA: Diagnosis present

## 2023-11-11 DIAGNOSIS — S069XAA Unspecified intracranial injury with loss of consciousness status unknown, initial encounter: Principal | ICD-10-CM | POA: Diagnosis present

## 2023-11-11 DIAGNOSIS — M810 Age-related osteoporosis without current pathological fracture: Secondary | ICD-10-CM | POA: Diagnosis present

## 2023-11-11 DIAGNOSIS — I471 Supraventricular tachycardia, unspecified: Secondary | ICD-10-CM | POA: Diagnosis not present

## 2023-11-11 DIAGNOSIS — I959 Hypotension, unspecified: Secondary | ICD-10-CM | POA: Diagnosis not present

## 2023-11-11 DIAGNOSIS — K7469 Other cirrhosis of liver: Secondary | ICD-10-CM | POA: Diagnosis not present

## 2023-11-11 DIAGNOSIS — Z808 Family history of malignant neoplasm of other organs or systems: Secondary | ICD-10-CM

## 2023-11-11 DIAGNOSIS — I4719 Other supraventricular tachycardia: Secondary | ICD-10-CM | POA: Diagnosis not present

## 2023-11-11 DIAGNOSIS — R4701 Aphasia: Secondary | ICD-10-CM | POA: Diagnosis present

## 2023-11-11 DIAGNOSIS — Z800 Family history of malignant neoplasm of digestive organs: Secondary | ICD-10-CM

## 2023-11-11 DIAGNOSIS — K7460 Unspecified cirrhosis of liver: Secondary | ICD-10-CM | POA: Diagnosis present

## 2023-11-11 DIAGNOSIS — L89622 Pressure ulcer of left heel, stage 2: Secondary | ICD-10-CM | POA: Diagnosis present

## 2023-11-11 DIAGNOSIS — R32 Unspecified urinary incontinence: Secondary | ICD-10-CM | POA: Diagnosis not present

## 2023-11-11 DIAGNOSIS — M549 Dorsalgia, unspecified: Secondary | ICD-10-CM | POA: Diagnosis present

## 2023-11-11 DIAGNOSIS — Z803 Family history of malignant neoplasm of breast: Secondary | ICD-10-CM

## 2023-11-11 DIAGNOSIS — F419 Anxiety disorder, unspecified: Secondary | ICD-10-CM | POA: Diagnosis present

## 2023-11-11 DIAGNOSIS — Z885 Allergy status to narcotic agent status: Secondary | ICD-10-CM

## 2023-11-11 DIAGNOSIS — E119 Type 2 diabetes mellitus without complications: Secondary | ICD-10-CM | POA: Diagnosis not present

## 2023-11-11 DIAGNOSIS — S065X0A Traumatic subdural hemorrhage without loss of consciousness, initial encounter: Secondary | ICD-10-CM | POA: Diagnosis not present

## 2023-11-11 DIAGNOSIS — Z8249 Family history of ischemic heart disease and other diseases of the circulatory system: Secondary | ICD-10-CM

## 2023-11-11 DIAGNOSIS — Z860101 Personal history of adenomatous and serrated colon polyps: Secondary | ICD-10-CM

## 2023-11-11 DIAGNOSIS — X58XXXA Exposure to other specified factors, initial encounter: Secondary | ICD-10-CM | POA: Diagnosis not present

## 2023-11-11 LAB — CBC WITH DIFFERENTIAL/PLATELET
Abs Immature Granulocytes: 0.05 10*3/uL (ref 0.00–0.07)
Basophils Absolute: 0 10*3/uL (ref 0.0–0.1)
Basophils Relative: 0 %
Eosinophils Absolute: 0.3 10*3/uL (ref 0.0–0.5)
Eosinophils Relative: 5 %
HCT: 35.4 % — ABNORMAL LOW (ref 36.0–46.0)
Hemoglobin: 11.2 g/dL — ABNORMAL LOW (ref 12.0–15.0)
Immature Granulocytes: 1 %
Lymphocytes Relative: 22 %
Lymphs Abs: 1.5 10*3/uL (ref 0.7–4.0)
MCH: 32 pg (ref 26.0–34.0)
MCHC: 31.6 g/dL (ref 30.0–36.0)
MCV: 101.1 fL — ABNORMAL HIGH (ref 80.0–100.0)
Monocytes Absolute: 0.7 10*3/uL (ref 0.1–1.0)
Monocytes Relative: 10 %
Neutro Abs: 4.3 10*3/uL (ref 1.7–7.7)
Neutrophils Relative %: 62 %
Platelets: 292 10*3/uL (ref 150–400)
RBC: 3.5 MIL/uL — ABNORMAL LOW (ref 3.87–5.11)
RDW: 14.8 % (ref 11.5–15.5)
WBC: 6.9 10*3/uL (ref 4.0–10.5)
nRBC: 0 % (ref 0.0–0.2)

## 2023-11-11 LAB — COMPREHENSIVE METABOLIC PANEL WITH GFR
ALT: 49 U/L — ABNORMAL HIGH (ref 0–44)
AST: 55 U/L — ABNORMAL HIGH (ref 15–41)
Albumin: 3 g/dL — ABNORMAL LOW (ref 3.5–5.0)
Alkaline Phosphatase: 238 U/L — ABNORMAL HIGH (ref 38–126)
Anion gap: 11 (ref 5–15)
BUN: 14 mg/dL (ref 8–23)
CO2: 21 mmol/L — ABNORMAL LOW (ref 22–32)
Calcium: 8.6 mg/dL — ABNORMAL LOW (ref 8.9–10.3)
Chloride: 106 mmol/L (ref 98–111)
Creatinine, Ser: 0.5 mg/dL (ref 0.44–1.00)
GFR, Estimated: >60 mL/min (ref >60)
Glucose, Bld: 165 mg/dL — ABNORMAL HIGH (ref 70–99)
Potassium: 3.1 mmol/L — ABNORMAL LOW (ref 3.5–5.1)
Sodium: 138 mmol/L (ref 135–145)
Total Bilirubin: 0.6 mg/dL (ref 0.0–1.2)
Total Protein: 7.3 g/dL (ref 6.5–8.1)

## 2023-11-11 LAB — GLUCOSE, CAPILLARY
Glucose-Capillary: 124 mg/dL — ABNORMAL HIGH (ref 70–99)
Glucose-Capillary: 201 mg/dL — ABNORMAL HIGH (ref 70–99)
Glucose-Capillary: 201 mg/dL — ABNORMAL HIGH (ref 70–99)
Glucose-Capillary: 238 mg/dL — ABNORMAL HIGH (ref 70–99)
Glucose-Capillary: 254 mg/dL — ABNORMAL HIGH (ref 70–99)
Glucose-Capillary: 262 mg/dL — ABNORMAL HIGH (ref 70–99)
Glucose-Capillary: 310 mg/dL — ABNORMAL HIGH (ref 70–99)
Glucose-Capillary: 49 mg/dL — ABNORMAL LOW (ref 70–99)
Glucose-Capillary: 65 mg/dL — ABNORMAL LOW (ref 70–99)

## 2023-11-11 LAB — PHOSPHORUS: Phosphorus: 4 mg/dL (ref 2.5–4.6)

## 2023-11-11 LAB — MAGNESIUM: Magnesium: 2.2 mg/dL (ref 1.7–2.4)

## 2023-11-11 LAB — BRAIN NATRIURETIC PEPTIDE: B Natriuretic Peptide: 62 pg/mL (ref 0.0–100.0)

## 2023-11-11 MED ORDER — METOPROLOL TARTRATE 25 MG PO TABS
25.0000 mg | ORAL_TABLET | Freq: Three times a day (TID) | ORAL | Status: DC
  Administered 2023-11-11 – 2023-11-12 (×4): 25 mg
  Filled 2023-11-11 (×4): qty 1

## 2023-11-11 MED ORDER — DIPHENHYDRAMINE HCL 25 MG PO CAPS
25.0000 mg | ORAL_CAPSULE | Freq: Four times a day (QID) | ORAL | Status: DC | PRN
  Administered 2023-11-11: 25 mg
  Filled 2023-11-11: qty 1

## 2023-11-11 MED ORDER — BANATROL TF EN LIQD
60.0000 mL | Freq: Two times a day (BID) | ENTERAL | Status: DC
  Administered 2023-11-11 – 2023-11-13 (×4): 60 mL
  Filled 2023-11-11 (×4): qty 60

## 2023-11-11 MED ORDER — INSULIN ASPART 100 UNIT/ML IJ SOLN: 0.0000 [IU] | INTRAMUSCULAR | Status: DC

## 2023-11-11 MED ORDER — DEXTROSE 50 % IV SOLN
INTRAVENOUS | Status: AC
  Administered 2023-11-11: 25 g via INTRAVENOUS
  Filled 2023-11-11: qty 50

## 2023-11-11 MED ORDER — FLEET ENEMA RE ENEM: 1.0000 | ENEMA | Freq: Once | RECTAL | Status: DC | PRN

## 2023-11-11 MED ORDER — ACETAMINOPHEN 325 MG PO TABS
325.0000 mg | ORAL_TABLET | ORAL | Status: DC | PRN
  Administered 2023-11-12: 325 mg
  Filled 2023-11-11: qty 2

## 2023-11-11 MED ORDER — VITAMIN B-1 100 MG PO TABS: 100.0000 mg | ORAL_TABLET | Freq: Every day | ORAL | Status: DC

## 2023-11-11 MED ORDER — FREE WATER
200.0000 mL | Freq: Three times a day (TID) | Status: DC
  Administered 2023-11-11 – 2023-11-13 (×5): 200 mL

## 2023-11-11 MED ORDER — INSULIN GLARGINE-YFGN 100 UNIT/ML ~~LOC~~ SOLN
15.0000 [IU] | Freq: Every day | SUBCUTANEOUS | Status: DC
  Administered 2023-11-12 – 2023-11-13 (×2): 15 [IU] via SUBCUTANEOUS
  Filled 2023-11-11 (×3): qty 0.15

## 2023-11-11 MED ORDER — INSULIN GLARGINE-YFGN 100 UNIT/ML ~~LOC~~ SOLN: 18.0000 [IU] | Freq: Every day | SUBCUTANEOUS | Status: DC

## 2023-11-11 MED ORDER — ASPIRIN 81 MG PO CHEW
81.0000 mg | CHEWABLE_TABLET | Freq: Every day | ORAL | Status: DC
  Administered 2023-11-12 – 2023-11-13 (×2): 81 mg
  Filled 2023-11-11 (×2): qty 1

## 2023-11-11 MED ORDER — METOPROLOL TARTRATE 25 MG PO TABS: 25.0000 mg | ORAL_TABLET | Freq: Three times a day (TID) | ORAL | Status: DC

## 2023-11-11 MED ORDER — CHOLECALCIFEROL 25 MCG (1000 UT) PO CHEW: 1000.0000 [IU] | CHEWABLE_TABLET | Freq: Every day | ORAL | Status: AC

## 2023-11-11 MED ORDER — INSULIN GLARGINE-YFGN 100 UNIT/ML ~~LOC~~ SOLN
15.0000 [IU] | Freq: Every day | SUBCUTANEOUS | Status: DC
  Administered 2023-11-11: 15 [IU] via SUBCUTANEOUS
  Filled 2023-11-11: qty 0.15

## 2023-11-11 MED ORDER — TRAMADOL HCL 50 MG PO TABS: 50.0000 mg | ORAL_TABLET | Freq: Two times a day (BID) | ORAL | Status: DC | PRN

## 2023-11-11 MED ORDER — LOPERAMIDE HCL 2 MG PO CAPS
2.0000 mg | ORAL_CAPSULE | Freq: Four times a day (QID) | ORAL | Status: DC | PRN
  Administered 2023-11-11: 2 mg via ORAL
  Filled 2023-11-11: qty 1

## 2023-11-11 MED ORDER — DEXTROSE 50 % IV SOLN: 25.0000 g | INTRAVENOUS | Status: AC

## 2023-11-11 MED ORDER — OSMOLITE 1.5 CAL PO LIQD: 1000.0000 mL | ORAL | Status: DC

## 2023-11-11 MED ORDER — PROCHLORPERAZINE MALEATE 5 MG PO TABS: 5.0000 mg | ORAL_TABLET | Freq: Four times a day (QID) | ORAL | Status: DC | PRN

## 2023-11-11 MED ORDER — AMIODARONE HCL 200 MG PO TABS: 200.0000 mg | ORAL_TABLET | Freq: Every day | ORAL | Status: DC

## 2023-11-11 MED ORDER — INSULIN ASPART 100 UNIT/ML IJ SOLN
0.0000 [IU] | INTRAMUSCULAR | Status: DC
  Administered 2023-11-11: 8 [IU] via SUBCUTANEOUS

## 2023-11-11 MED ORDER — LOPERAMIDE HCL 1 MG/7.5ML PO SUSP: 2.0000 mg | Freq: Four times a day (QID) | ORAL | Status: DC | PRN

## 2023-11-11 MED ORDER — VITAMIN B-12 1000 MCG PO TABS
500.0000 ug | ORAL_TABLET | Freq: Every day | ORAL | Status: DC
  Administered 2023-11-12 – 2023-11-13 (×2): 500 ug
  Filled 2023-11-11 (×2): qty 1

## 2023-11-11 MED ORDER — INSULIN ASPART 100 UNIT/ML IJ SOLN
3.0000 [IU] | INTRAMUSCULAR | Status: DC
  Administered 2023-11-11: 3 [IU] via SUBCUTANEOUS

## 2023-11-11 MED ORDER — ASPIRIN 81 MG PO CHEW: 81.0000 mg | CHEWABLE_TABLET | Freq: Every day | ORAL | Status: DC

## 2023-11-11 MED ORDER — INSULIN ASPART 100 UNIT/ML IJ SOLN: 3.0000 [IU] | INTRAMUSCULAR | Status: DC

## 2023-11-11 MED ORDER — PREDNISONE 5 MG PO TABS
10.0000 mg | ORAL_TABLET | Freq: Every day | ORAL | Status: DC
  Administered 2023-11-12 – 2023-11-13 (×2): 10 mg
  Filled 2023-11-11 (×2): qty 2

## 2023-11-11 MED ORDER — TRAZODONE HCL 50 MG PO TABS
25.0000 mg | ORAL_TABLET | Freq: Every evening | ORAL | Status: DC | PRN
  Administered 2023-11-12: 25 mg
  Filled 2023-11-11: qty 1

## 2023-11-11 MED ORDER — INSULIN ASPART 100 UNIT/ML IJ SOLN
3.0000 [IU] | INTRAMUSCULAR | Status: DC
  Administered 2023-11-11 – 2023-11-13 (×9): 3 [IU] via SUBCUTANEOUS

## 2023-11-11 MED ORDER — PANTOPRAZOLE SODIUM 40 MG IV SOLR: 40.0000 mg | Freq: Every day | INTRAVENOUS | Status: DC

## 2023-11-11 MED ORDER — HYDRALAZINE HCL 10 MG PO TABS: 10.0000 mg | ORAL_TABLET | Freq: Four times a day (QID) | ORAL | Status: DC | PRN

## 2023-11-11 MED ORDER — PROCHLORPERAZINE 25 MG RE SUPP: 12.5000 mg | Freq: Four times a day (QID) | RECTAL | Status: DC | PRN

## 2023-11-11 MED ORDER — GUAIFENESIN-DM 100-10 MG/5ML PO SYRP
5.0000 mL | ORAL_SOLUTION | Freq: Four times a day (QID) | ORAL | Status: DC | PRN
  Administered 2023-11-12: 10 mL
  Filled 2023-11-11: qty 10

## 2023-11-11 MED ORDER — ASPIRIN 300 MG RE SUPP: 300.0000 mg | Freq: Every day | RECTAL | Status: DC

## 2023-11-11 MED ORDER — PANTOPRAZOLE SODIUM 40 MG IV SOLR
40.0000 mg | Freq: Every day | INTRAVENOUS | Status: DC
  Administered 2023-11-11 – 2023-11-12 (×2): 40 mg via INTRAVENOUS
  Filled 2023-11-11 (×3): qty 10

## 2023-11-11 MED ORDER — PROSOURCE TF20 ENFIT COMPATIBL EN LIQD
60.0000 mL | Freq: Every day | ENTERAL | Status: DC
  Administered 2023-11-12: 60 mL
  Filled 2023-11-11: qty 60

## 2023-11-11 MED ORDER — EZETIMIBE 10 MG PO TABS: 10.0000 mg | ORAL_TABLET | Freq: Every day | ORAL | Status: DC

## 2023-11-11 MED ORDER — LEVETIRACETAM 1000 MG PO TABS: 1000.0000 mg | ORAL_TABLET | Freq: Two times a day (BID) | ORAL | Status: DC

## 2023-11-11 MED ORDER — INSULIN GLARGINE-YFGN 100 UNIT/ML ~~LOC~~ SOLN: 15.0000 [IU] | Freq: Every day | SUBCUTANEOUS | Status: DC

## 2023-11-11 MED ORDER — ORAL CARE MOUTH RINSE
15.0000 mL | OROMUCOSAL | Status: DC
  Administered 2023-11-11 – 2023-11-13 (×7): 15 mL via OROMUCOSAL

## 2023-11-11 MED ORDER — THIAMINE MONONITRATE 100 MG PO TABS
100.0000 mg | ORAL_TABLET | Freq: Every day | ORAL | Status: DC
  Administered 2023-11-12 – 2023-11-13 (×2): 100 mg
  Filled 2023-11-11 (×2): qty 1

## 2023-11-11 MED ORDER — INSULIN ASPART 100 UNIT/ML IJ SOLN
0.0000 [IU] | INTRAMUSCULAR | Status: DC
  Administered 2023-11-11: 5 [IU] via SUBCUTANEOUS
  Administered 2023-11-12 (×2): 3 [IU] via SUBCUTANEOUS
  Administered 2023-11-12: 5 [IU] via SUBCUTANEOUS
  Administered 2023-11-12: 8 [IU] via SUBCUTANEOUS
  Administered 2023-11-12: 5 [IU] via SUBCUTANEOUS
  Administered 2023-11-12: 11 [IU] via SUBCUTANEOUS
  Administered 2023-11-13: 3 [IU] via SUBCUTANEOUS
  Administered 2023-11-13: 5 [IU] via SUBCUTANEOUS

## 2023-11-11 MED ORDER — FREE WATER: 200.0000 mL | Freq: Three times a day (TID) | Status: DC

## 2023-11-11 MED ORDER — BISACODYL 10 MG RE SUPP: 10.0000 mg | Freq: Every day | RECTAL | Status: DC | PRN

## 2023-11-11 MED ORDER — EZETIMIBE 10 MG PO TABS
10.0000 mg | ORAL_TABLET | Freq: Every day | ORAL | Status: DC
  Administered 2023-11-12 – 2023-11-13 (×2): 10 mg
  Filled 2023-11-11 (×2): qty 1

## 2023-11-11 MED ORDER — ALUM & MAG HYDROXIDE-SIMETH 200-200-20 MG/5ML PO SUSP: 30.0000 mL | ORAL | Status: DC | PRN

## 2023-11-11 MED ORDER — TRAMADOL HCL 50 MG PO TABS
50.0000 mg | ORAL_TABLET | Freq: Two times a day (BID) | ORAL | Status: DC | PRN
  Administered 2023-11-11 – 2023-11-13 (×3): 50 mg via NASOGASTRIC
  Filled 2023-11-11 (×3): qty 1

## 2023-11-11 MED ORDER — BANATROL TF EN LIQD: 60.0000 mL | Freq: Two times a day (BID) | ENTERAL | Status: DC

## 2023-11-11 MED ORDER — CAMPHOR-MENTHOL 0.5-0.5 % EX LOTN
TOPICAL_LOTION | CUTANEOUS | Status: DC | PRN
  Filled 2023-11-11: qty 222

## 2023-11-11 MED ORDER — ADULT MULTIVITAMIN W/MINERALS CH
1.0000 | ORAL_TABLET | Freq: Every day | ORAL | Status: DC
  Administered 2023-11-12 – 2023-11-13 (×2): 1
  Filled 2023-11-11 (×2): qty 1

## 2023-11-11 MED ORDER — LEVETIRACETAM 500 MG PO TABS
1000.0000 mg | ORAL_TABLET | Freq: Two times a day (BID) | ORAL | Status: DC
  Administered 2023-11-11 – 2023-11-13 (×4): 1000 mg
  Filled 2023-11-11 (×4): qty 2

## 2023-11-11 MED ORDER — AMIODARONE HCL 200 MG PO TABS
200.0000 mg | ORAL_TABLET | Freq: Every day | ORAL | Status: DC
  Administered 2023-11-12 – 2023-11-13 (×2): 200 mg
  Filled 2023-11-11 (×2): qty 1

## 2023-11-11 MED ORDER — GERHARDT'S BUTT CREAM: TOPICAL_CREAM | Freq: Two times a day (BID) | CUTANEOUS | Status: DC | PRN

## 2023-11-11 NOTE — Progress Notes (Signed)
 Inpatient Rehab Admissions Coordinator:    I have insurance approval and a bed available for pt to admit to CIR today. Dr. Osborne Blazer in agreement and [facility] aware.  I will meet with family at bedside to update them and make arrangements.    Loye Rumble, PT, DPT Admissions Coordinator [phone] 11/11/23  10:29 AM

## 2023-11-11 NOTE — Progress Notes (Signed)
 Daily Progress Note   Patient Name: Miranda Cochran       Date: 11/11/2023 DOB: 02-08-1951  Age: 73 y.o. MRN#: [id number] Attending Physician: Epifanio Haste, MD Primary Care Physician: Donley Furth, MD Admit Date: 10/26/2023  Reason for Consultation/Follow-up: Establishing goals of care   Length of Stay: 15  Current Medications: Scheduled Meds:   amiodarone   200 mg Per Tube Daily   aspirin   81 mg Per Tube Daily   Or   aspirin   300 mg Rectal Daily   Chlorhexidine  Gluconate Cloth  6 each Topical Daily   cyanocobalamin   500 mcg Per Tube Daily   ezetimibe   10 mg Per Tube Daily   feeding supplement (PROSource TF20)  60 mL Per Tube Daily   fiber supplement (BANATROL TF)  60 mL Per Tube BID   free water   200 mL Per Tube Q8H   insulin  aspart  0-15 Units Subcutaneous Q4H   insulin  aspart  0-20 Units Subcutaneous Q4H   insulin  aspart  3 Units Subcutaneous Q4H   insulin  glargine-yfgn  15 Units Subcutaneous Daily   levETIRAcetam   1,000 mg Per Tube BID   metoprolol  tartrate  25 mg Per Tube Q8H   multivitamin with minerals  1 tablet Per Tube Daily   mouth rinse  15 mL Mouth Rinse 4 times per day   pantoprazole  (PROTONIX ) IV  40 mg Intravenous QHS   predniSONE   10 mg Per Tube Q breakfast   thiamine   100 mg Per Tube Daily    Continuous Infusions:  feeding supplement (OSMOLITE 1.5 CAL) 45 mL/hr at 11/10/23 2000    PRN Meds: acetaminophen  (TYLENOL ) oral liquid 160 mg/5 mL, diltiazem , Gerhardt's butt cream, hydrALAZINE , mouth rinse, traMADol   Physical Exam Vitals reviewed.  Constitutional:      General: She is not in acute distress.    Comments: cortrak  Cardiovascular:     Rate and Rhythm: Normal rate.  Pulmonary:     Effort: Pulmonary effort is normal.  Skin:    General:  Skin is warm and dry.  Neurological:     Mental Status: She is alert.  Psychiatric:        Mood and Affect: Mood normal.        Behavior: Behavior normal.        Thought Content: Thought  content normal.             Vital Signs: BP (!) 139/102 (BP Location: Left Wrist)   Pulse 95   Temp 98.3 F (36.8 C) (Oral)   Resp (!) 31   Ht 5\' 4"  (1.626 m)   Wt 54.2 kg   SpO2 95%   BMI 20.51 kg/m  SpO2: SpO2: 95 % O2 Device: O2 Device: Room Air      Patient Active Problem List   Diagnosis Date Noted   SVT (supraventricular tachycardia) (HCC) 11/08/2023   Moderate aortic stenosis 11/08/2023   Localization-related (focal) (partial) idiopathic epilepsy and epileptic syndromes with seizures of localized onset, not intractable, with status epilepticus (HCC) 10/31/2023   Protein-calorie malnutrition, severe 10/28/2023   Subdural hematoma (HCC) 10/27/2023   Hypokalemia 10/27/2023   Hypertensive urgency 10/27/2023   Acute CVA (cerebrovascular accident) (HCC) 10/27/2023   Subdural bleeding (HCC) 10/27/2023   TBI (traumatic brain injury) (HCC) 10/27/2023   Osteoporosis 08/29/2023   Uncontrolled diabetes mellitus with hyperglycemia, with long-term current use of insulin  (HCC) 02/25/2022   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   Cellulitis of right lower leg 04/07/2021   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 08/29/2020   Alcohol use 08/29/2020   Family history of colon cancer in father dx 60 and died 101 w/ second cancer 2019/09/20   Insomnia 06/11/2019   Asthma 06/11/2019   Venous (peripheral) insufficiency 04/12/2018   Long term (current) use of systemic steroids 11/02/2017   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 female  with past medical history of autoimmune hepatitis with cirrhosis of liver, hypertension, depression, GERD, hyperlipidemia admitted on 10/26/2023 after falling and hitting her head.   In ED CT showed subdural hematoma, subarachnoid hemorrhage, and occipital skull fracture. She is intubated.    Note from neurology 5/16: Most recent CT demonstrates extensive multifocal posttraumatic intracranial hemorrhage,stable since yesterday with except for: Small 4 mm mixed but predominantly low-density left Subdural Hematoma has increased.  Today's Discussion: Reviewed chart. She had a modified barium swallow yesterday. She is now on a pureed and honey thick liquid diet. Plan for patient to move to CIR today.  Patient lying in bed with her husband Ines Mane is at bedside. Gladyes just had an episode of diarrhea. She shared her frustration with the amount of bowel movements and soreness from being cleaned up. We discussed the option to use a barrier cream and Chet will ask nursing about this. We discussed that artificial nutrition can sometimes cause diarrhea. We discussed her swallow study from yesterday. Both Cherilynn and [place] are encouraged that she moved to an oral diet and are hopeful the cortrak will be able to be discontinued soon. We discussed her improvement during this hospitalization. They are excited that the patient will be moving to CIR today. Javon looks forward to working with therapies to improve her independence.    We discussed that her PMT consult will fall off when she moves to CIR.  Chet  seems interested in continuing PMT follow-up.  Encouraged them to let CIR attending team know if they would like continued PMT support and we can be  reconsulted.   Recommendations/Plan: Full code Full scope Time for outcomes Transfer to CIR Encouraged patient and family to notify CIR provider if they would like continued PMT support    Code Status:    Code Status Orders  (From admission, onward)           Start     Ordered   10/27/23 0330  Full code  Continuous       Question:  By:  Answer:  Consent: discussion documented in EHR   10/27/23 0330         Extensive chart review has been completed prior to seeing the patient including labs, vital signs, imaging, progress/consult notes, orders, medications, and available advance directive documents.  Care plan discussed with PMT chaplain Willetta Harpin  Time spent: 35 minutes  Thank you for allowing the Palliative Medicine Team to assist in the care of this patient.    Daina Drum, NP  Please contact Palliative Medicine Team phone at [phone] for questions and concerns.

## 2023-11-11 NOTE — TOC Transition Note (Signed)
 Transition of Care [facility]) - Discharge Note   Patient Details  Name: Miranda Cochran MRN: [id number] Date of Birth: 1950-11-22  [facility] of [facility]) CM/SW Contact:  Ronni Colace, RN Phone Number: 11/11/2023, 9:24 AM   Clinical Narrative:    Patient will transition to CIR today, Authorization has been obtained.    Final next level of care: IP Rehab Facility Barriers to Discharge: No Barriers Identified   Patient Goals and CMS Choice    CIR        Discharge Placement                       Discharge Plan and Services Additional resources added to the After Visit Summary for                                       Social Drivers of Health (SDOH) Interventions SDOH Screenings   Food Insecurity: No Food Insecurity (04/21/2023)  Housing: Low Risk  (04/21/2023)  Transportation Needs: No Transportation Needs (04/21/2023)  Utilities: Low Risk  (03/21/2023)   Received from [facility]  Alcohol Screen: Low Risk  (04/21/2023)  Depression (PHQ2-9): Low Risk  (08/29/2023)  Financial Resource Strain: Low Risk  (04/21/2023)  Physical Activity: Insufficiently Active (04/21/2023)  Social Connections: Moderately Integrated (04/21/2023)  Stress: Stress Concern Present (04/21/2023)  Tobacco Use: Low Risk  (10/27/2023)     Readmission Risk Interventions     No data to display

## 2023-11-11 NOTE — Discharge Summary (Signed)
 Physician Discharge Summary  Miranda Cochran GNF:[id number] DOB: 21-May-1951 DOA: 10/26/2023  PCP: Donley Furth, MD  Admit date: 10/26/2023 Discharge date: 11/11/2023  Admitted From: (Home) Disposition:  (CIR)  Recommendations for Outpatient Follow-up:  Monitor BMP, CBC closely Adjust insulin  regimen as needed, it has been lowered at the time of discharge today given she had some hypoglycemia overnight, adjust as needed Patient and medication via core Trak, will need close follow up with SLP, he had MBS 5/29 where She was able to tolerate Lafe Pies with honey thick liquids   Brief/Interim Summary:     73 year old female with history of alcohol abuse, 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, chronically on prednisone , hypertension, depression, DM type II, GAVE syndrome causing GI bleed, GERD, hiatal hernia, thyroid  nodule, PAD who presented to the hospital on 10/26/2023 after multiple falls at home and head injuries, she developed decrease in her mental status while in the hospital on 10/27/2023 the next day of admission, head CT was consistent with TBI with traumatic subdural hematoma, subarachnoid hemorrhage and right temporal contusion MRI also showed small acute infarcts in bilateral cerebral hemispheres right more than left, she was subsequently seen by the PCCM team, she was intubated, she was also found to have seizures subsequently for which she received Keppra .  She developed expressive aphasia and dysphagia for which she is on NG tube feeds, finally she was extubated on 11/04/2023 and [facility] on 11/07/2023.  She continues to improve significantly with physical therapy, Occupational Therapy, she remains on tube feed as he remains with significant dysphagia, but overall she has been improving there as well, with hope at ONE point  she  will be advanced where NG tube can be discontinued, patient accepted by CIR.      Significant Hospital Events: Including procedures,  antibiotic start and stop dates in addition to other pertinent events   5/14 admit 5/15 AMS, CT head consistent with TBI with subdural hematoma, subarachnoid hemorrhage, right temporal contusion along with MRI suggestive of bilateral acute infarcts left more than right. 5/16 Remains on minimal vent settings, hypoglycemic episode this AM, originally following commands overnight. Repeat CT Scan shows mild worsening of bleeding 5/17 Seizures on EEG.  Given additional dose of Keppra , Keppra  dose increased and loaded with phenobarb. CT head with stable extensive multifocal posttraumatic ICH, no MLS, basilar cisterns remain patent, stable nondisplaced left posterior skull fx 5/18 EEG neg. Sedation off, tolerating SBT 8/5 but not waking up yet 5/19 LE duplex with R DVT, had IVC filter placed as AC not cleared by neurosurgery. CTA chest neg for PE but showed LLL PNA 5/22 On precedex, tolerating SBT, no further sz EEG 5/23 Extubated  5/24 Weak cough with tachypnea, at risk for inability to protect airway  5/26.  Transferred to aggressive care, remains on core track tube feed, but mentation much improved, as well her strength, but she remains with significant dysphagia.     Traumatic subdural hemorrhage  Post traumatic IPH, multifocal Right temporal contusion Severe acute encephalopathy -Admission has severe acute encephalopathy, left-sided hemiparesis, dysphagia, expressive aphasia, caused by multiple falls at home causing traumatic brain injury, subdural hematoma, subarachnoid hematoma, right temporal contusion, MRI also suggestive of bilateral infarcts left cerebral hemisphere more than right.  Complicated by seizures. - has left-sided weakness, expressive aphasia and  dysphagia, on NG tube feeds, overall she continues to improve, mentation, speech and weakness much improved, continue with aggressive physical therapy as appears to be benefiting from it at this  point.   -Neurology has requested Zio patch  evaluation after discharge, - Followed closely by SLP, she remains n.p.o. after their evaluation yesterday, so for now continue with core Trak tube feed, to allow time for recovery as he has been gradually improving, and hoping in few days she will be able to pass her swallow evaluation and avoid PEG.    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 Not cleared for anticoagulation by neurosurgery. CTA neg for PE. P: IVC filter remains in place, Risk of anticoagulation outweighs benefit given chronic falls and subdural hemorrhage     SVT run 11/07/23 - broke with adenosine , scheduled Lopressor, IVF, EKG, TTE noted, TSH stable, cards input appreciated now on B Blocker + Amiodarone .   H/O AS moderate to severe - per Cards, avoid preload drops   HX of alcohol abuse,   No DTs.  Discussed with husband, she is currently having only occasional alcohol use, continue with thiamine  and folic acid .   Diabetes mellitus type 2 Patient has been on Semglee , NovoLog  every 4 hours, and NovoLog  sliding scale she had hypoglycemic event overnight, so I have decreased her Semglee  dose, scheduled NovoLog  and insulin  sliding scale to moderate.  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   Uncontrolled diabetes mellitus with hyperglycemia, with long-term current use of insulin  (HCC)   Hypokalemia   Hypertensive urgency   Acute CVA (cerebrovascular accident) (HCC)   Subdural bleeding (HCC)   TBI (traumatic brain injury) (HCC)   Protein-calorie malnutrition, severe   Localization-related (focal) (partial) idiopathic epilepsy and epileptic syndromes with seizures of localized onset, not intractable, with status epilepticus (HCC)   SVT (supraventricular tachycardia) (HCC)   Moderate aortic stenosis    Discharge Instructions  Discharge Instructions     Diet - low sodium heart healthy   Complete by: As directed    Discharge instructions   Complete by: As directed    Management per CIR   Increase activity slowly   Complete by: As directed    No wound care   Complete by: As directed       Allergies as of 11/11/2023       Reactions   Fentanyl  Anxiety   Made her feel very anxious, requests not to get it.   Levofloxacin Anxiety   Weird dreams    Amoxicillin  Diarrhea   Has patient had a PCN reaction causing immediate rash, facial/tongue/throat swelling, SOB or lightheadedness with hypotension: No Has patient had a PCN reaction causing severe rash involving mucus membranes or skin necrosis: No Has patient had a PCN reaction that required hospitalization: No Has patient had a PCN reaction occurring within the last 10 years: Yes If all of the above answers are "NO", then may proceed with Cephalosporin use.  Augmentin  [amoxicillin -pot Clavulanate] Other (See Comments)   diarrhea   Farxiga  [dapagliflozin ] Diarrhea   Gi pain   Statins    Myalgia         Medication List     STOP taking these medications    ALPRAZolam  0.5 MG tablet Commonly known as: XANAX    aspirin  325 MG tablet Replaced by: aspirin  81 MG chewable tablet   cetirizine  10 MG tablet Commonly known as: ZYRTEC    diphenhydrAMINE  25 mg capsule Commonly known  as: BENADRYL    furosemide  40 MG tablet Commonly known as: LASIX    ibuprofen 200 MG tablet Commonly known as: ADVIL   insulin  lispro 100 UNIT/ML KwikPen Commonly known as: HUMALOG    lansoprazole  30 MG capsule Commonly known as: PREVACID    lisinopril  10 MG tablet Commonly known as: ZESTRIL    methocarbamol 500 MG tablet Commonly known as: ROBAXIN   Rybelsus 14 MG Tabs Generic drug: Semaglutide   traZODone  50 MG tablet Commonly known as: DESYREL    tretinoin 0.025 % cream Commonly known as: RETIN-A   venlafaxine  XR 75 MG 24 hr capsule Commonly known as: EFFEXOR -XR       TAKE these medications    AMBULATORY NON FORMULARY MEDICATION ACTIVE LIVER Artichoke leaf extract, Milk thistle, Turmeric Take 1 capsule by mouth once daily   amiodarone 200 MG tablet Commonly known as: PACERONE Place 1 tablet (200 mg total) into feeding tube daily.   aspirin  81 MG chewable tablet Place 1 tablet (81 mg total) into feeding tube daily. Replaces: aspirin  325 MG tablet   Cholecalciferol 25 MCG (1000 UT) Chew Chew 1 tablet (1,000 Units total) by mouth daily.   Cyanocobalamin  500 MCG Chew Chew 500 mcg by mouth daily.   ezetimibe  10 MG tablet Commonly known as: ZETIA  Place 1 tablet (10 mg total) into feeding tube daily. TAKE 1 TABLET(10 MG) BY MOUTH DAILY What changed:  how much to take how to take this when to take this   feeding supplement (OSMOLITE 1.5 CAL) Liqd Place 1,000 mLs into feeding tube continuous.   fiber supplement (BANATROL TF) liquid Place 60 mLs into feeding tube 2 (two) times daily.   free water Soln Place 200 mLs into feeding tube every 8 (eight) hours.   insulin  aspart 100 UNIT/ML injection Commonly known as: novoLOG  Inject 3 Units into the skin every 4 (four) hours.   insulin  aspart 100 UNIT/ML injection Commonly known as: novoLOG  Inject 0-15 Units into the skin every 4 (four) hours.   insulin  glargine-yfgn 100 UNIT/ML injection Commonly known  as: SEMGLEE  Inject 0.15 mLs (15 Units total) into the skin daily.   levETIRAcetam  1000 MG tablet Commonly known as: KEPPRA  Place 1 tablet (1,000 mg total) into feeding tube 2 (two) times daily.   metoprolol tartrate 25 MG tablet Commonly known as: LOPRESSOR Place 1 tablet (25 mg total) into feeding tube every 8 (eight) hours.   MULTIVITAMIN PO Take by mouth.   naloxone 4 MG/0.1ML Liqd nasal spray kit Commonly known as: NARCAN Spray the contents of 1 device into 1 nostril. Call 911. May repeat with 2nd device in alternate nostril if no response in 2-3 minutes.   pantoprazole  40 MG injection Commonly known as: PROTONIX  Inject 40 mg into the vein at bedtime.   predniSONE  10 MG tablet Commonly known as: DELTASONE  Take 10 mg by mouth daily with breakfast.   thiamine  100 MG tablet Commonly known as: Vitamin B-1 Place 1 tablet (100 mg total) into feeding tube daily.   traMADol 50 MG  tablet Commonly known as: ULTRAM 1 tablet (50 mg total) by Per NG tube route every 12 (twelve) hours as needed for moderate pain (pain score 4-6).        Allergies  Allergen Reactions   Fentanyl  Anxiety    Made her feel very anxious, requests not to get it.   Levofloxacin Anxiety    Weird dreams    Amoxicillin  Diarrhea    Has patient had a PCN reaction causing immediate rash, facial/tongue/throat swelling, SOB or lightheadedness with hypotension: No Has patient had a PCN reaction causing severe rash involving mucus membranes or skin necrosis: No Has patient had a PCN reaction that required hospitalization: No Has patient had a PCN reaction occurring within the last 10 years: Yes If all of the above answers are "NO", then may proceed with Cephalosporin use.    Augmentin  [Amoxicillin -Pot Clavulanate] Other (See Comments)    diarrhea   Farxiga  [Dapagliflozin ] Diarrhea    Gi pain   Statins     Myalgia     Consultations: Neurology, NS - Dr Ellery Guthrie, PCCM, Cards, palliative ca     Procedures/Studies: DG Swallowing Func-Speech Pathology Result Date: 11/10/2023 Table formatting from the original result was not included. Modified Barium Swallow Study Patient Details Name: BEADIE MATSUNAGA MRN: [id number] Date of Birth: Jul 25, 1950 Today's Date: 11/10/2023 HPI/PMH: HPI: Pt is a 73 y.o. female presenting after fall 5/14. CT with occipital skull fracture, traumatic subarachnoid hemorrhage and right subdural hematoma. Repeat CT increased size of R subdural hematoma. Intubated 5/15-5/23 secondary to respiratory failure secondary to E. coli, Streptococcus HAP. EEG 5/16 with cortical dysfunction arising from R hemisphere, moderate-severe diffuse encephalopathy. EEG 5/17 with seizure activity averaging 4 seizures per hour lasting 1 min each; resolved with adjustment to medications. RLE DVT 5/19 and IVC filter placed. PMH significant for autoimmune hepatitis with cirrhosis of liver, HTN, depression, GERD, HLD, DM, IBS, osteoporosis, thyroid  nodule, vascular disease, vitamin D deficiency. Clinical Impression: Clinical Impression: Pt demonstrated a DIGEST score of 2 indicating moderate oropharyngeal dysphagia. Pt had excessive movements, decreased sustained attention and needed cueing throughout study. There was decreased labial closure with anterior spill, trace lingual residue and slow mastication of solid in part due to decreased attention. There was incomplete glottal closure, partial anterior hyoid excursion and partial epiglottic inversion resulting in penetration with thin to the cords that pt sensed and cleared throat to clear penetrates. Chin tuck strategy was ineffective. Nectar thick penetrated to cords with minimal remaining in vestibule. Laryngeal penetration with honey thick to vocal cords that pt sensed and spontaneous throat clear cleared vestibule. Pharyngeal residue was mild in valleculae and pyriform sinuses. Recommend puree texture, honey thick liquids, intermittent throat clear,  crush pills and full assist/supervision with po's. DIGEST Swallow Severity Rating*  Safety:  Efficiency:  Overall Pharyngeal Swallow Severity: 1: mild; 2: moderate; 3: severe; 4: profound *The Dynamic Imaging Grade of Swallowing Toxicity is standardized for the head and neck cancer population, however, demonstrates promising clinical applications across populations to standardize the clinical rating of pharyngeal swallow safety and severity. Factors that may increase risk of adverse event in presence of aspiration Roderick Civatte & Jessy Morocco 2021): Factors that may increase risk of adverse event in presence of aspiration Roderick Civatte & Jessy Morocco 2021): Reduced cognitive function Recommendations/Plan: Swallowing Evaluation Recommendations Swallowing Evaluation Recommendations Recommendations: PO diet PO Diet Recommendation: Moderately thick liquids (Level 3, honey thick); Dysphagia 1 (Pureed) Liquid Administration via: Cup Medication Administration: Crushed with puree Supervision: Staff to assist with self-feeding; Full supervision/cueing for  swallowing strategies Swallowing strategies  : Slow rate; Small bites/sips; Clear throat intermittently Postural changes: Position pt fully upright for meals Oral care recommendations: Oral care BID (2x/day) Treatment Plan Treatment Plan Treatment recommendations: Therapy as outlined in treatment plan below Follow-up recommendations: Acute inpatient rehab (3 hours/day) Functional status assessment: Patient has had a recent decline in their functional status and demonstrates the ability to make significant improvements in function in a reasonable and predictable amount of time. Treatment frequency: Min 2x/week Treatment duration: 2 weeks Interventions: Compensatory techniques; Patient/family education; Trials of upgraded texture/liquids; Diet toleration management by SLP Recommendations Recommendations for follow up therapy are one component of a multi-disciplinary discharge planning process, led  by the attending physician.  Recommendations may be updated based on patient status, additional functional criteria and insurance authorization. Assessment: Orofacial Exam: Orofacial Exam Oral Cavity - Dentition: Adequate natural dentition Orofacial Anatomy: WFL Oral Motor/Sensory Function: WFL Anatomy: Anatomy: WFL Boluses Administered: Boluses Administered Boluses Administered: Thin liquids (Level 0); Mildly thick liquids (Level 2, nectar thick); Moderately thick liquids (Level 3, honey thick); Puree; Solid  Oral Impairment Domain: Oral Impairment Domain Lip Closure: Escape progressing to mid-chin Tongue control during bolus hold: Cohesive bolus between tongue to palatal seal Bolus preparation/mastication: Slow prolonged chewing/mashing with complete recollection Bolus transport/lingual motion: Brisk tongue motion Oral residue: Trace residue lining oral structures Location of oral residue : Tongue Initiation of pharyngeal swallow : Pyriform sinuses  Pharyngeal Impairment Domain: Pharyngeal Impairment Domain Soft palate elevation: No bolus between soft palate (SP)/pharyngeal wall (PW) Laryngeal elevation: Complete superior movement of thyroid  cartilage with complete approximation of arytenoids to epiglottic petiole Anterior hyoid excursion: Partial anterior movement Epiglottic movement: Partial inversion Laryngeal vestibule closure: Incomplete, narrow column air/contrast in laryngeal vestibule Pharyngeal stripping wave : Present - complete Pharyngeal contraction (A/P view only): N/A Pharyngoesophageal segment opening: Complete distension and complete duration, no obstruction of flow Tongue base retraction: No contrast between tongue base and posterior pharyngeal wall (PPW) Pharyngeal residue: Collection of residue within or on pharyngeal structures Location of pharyngeal residue: Valleculae; Pyriform sinuses  Esophageal Impairment Domain: Esophageal Impairment Domain Esophageal clearance upright position:  Esophageal retention Pill: No data recorded Penetration/Aspiration Scale Score: Penetration/Aspiration Scale Score 1.  Material does not enter airway: Solid; Puree 2.  Material enters airway, remains ABOVE vocal cords then ejected out: Moderately thick liquids (Level 3, honey thick) 3.  Material enters airway, remains ABOVE vocal cords and not ejected out: Moderately thick liquids (Level 3, honey thick) 4.  Material enters airway, CONTACTS cords then ejected out: Moderately thick liquids (Level 3, honey thick) Compensatory Strategies: Compensatory Strategies Compensatory strategies: Yes Chin tuck: Ineffective Ineffective Chin Tuck: Thin liquid (Level 0)   General Information: Caregiver present: No  Diet Prior to this Study: NPO; Cortrak/Small bore NG tube   Temperature : Normal   Respiratory Status: WFL   Supplemental O2: None (Room air)   History of Recent Intubation: Yes  Behavior/Cognition: Alert; Cooperative; Confused; Distractible; Requires cueing Self-Feeding Abilities: Needs assist with self-feeding Baseline vocal quality/speech: Normal No data recorded No data recorded Exam Limitations: Excessive movement Goal Planning: Prognosis for improved oropharyngeal function: Good Barriers to Reach Goals: Cognitive deficits No data recorded No data recorded Consulted and agree with results and recommendations: Patient; Nurse Pain: Pain Assessment Pain Assessment: Faces Faces Pain Scale: 0 Breathing: 0 Negative Vocalization: 0 Facial Expression: 0 Body Language: 0 Consolability: 0 PAINAD Score: 0 Facial Expression: 0 Body Movements: 0 Muscle Tension: 0 Compliance with ventilator (intubated pts.): N/A Vocalization (  extubated pts.): 0 CPOT Total: 0 Pain Location: generalized, back, sacral sores Pain Descriptors / Indicators: Discomfort; Grimacing Pain Intervention(s): Monitored during session End of Session: Start Time:SLP Start Time (ACUTE ONLY): 1418 Stop Time: SLP Stop Time (ACUTE ONLY): 1440 Time Calculation:SLP Time  Calculation (min) (ACUTE ONLY): 22 min Charges: SLP Evaluations $ SLP Speech Visit: 1 Visit SLP Evaluations $MBS Swallow: 1 Procedure $Swallowing Treatment: 1 Procedure SLP visit diagnosis: SLP Visit Diagnosis: Dysphagia, oropharyngeal phase (R13.12) Past Medical History: Past Medical History: Diagnosis Date  Abnormal Pap smear of cervix   05-15-21 ascus hpv hr+  Allergy   Anxiety   on meds  Autoimmune hepatitis (HCC) 08/18/2016  08/2016 liver bx confirms, fibrosis not cirrhosis  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 08/29/2020  COVID 06/27/2020  Depression   on meds  DM (diabetes mellitus) (HCC)   Fracture, fibula   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  GERD (gastroesophageal reflux disease)   on meds  Hiatal hernia   Hx of adenomatous polyp of colon 07/07/2009  Hyperlipidemia   diet controlled  Hypertension   on meds - LASIX   IBS (irritable bowel syndrome)   Iron  deficiency anemia   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 Willy Harvest, adenimatous polyps, repeat in 5 yrs  ESOPHAGOGASTRODUODENOSCOPY  07/06/2021  per Dr. Willy Harvest, showed portal hypertensive gastropathy, repeat in 2 yrs  ESOPHAGOGASTRODUODENOSCOPY (EGD) WITH PROPOFOL  N/A 06/20/2017  Procedure: ESOPHAGOGASTRODUODENOSCOPY (EGD) WITH PROPOFOL ;  Surgeon: Kenney Peacemaker, MD;  Location: [facility];  Service: Endoscopy;  Laterality: N/A;  with Barrx  EYE SURGERY    IR IVC FILTER PLMT / S&I /IMG GUID/MOD SED  10/31/2023  NASAL SEPTOPLASTY W/ TURBINOPLASTY    POLYPECTOMY  2015  CG-TA  TONSILLECTOMY   [name] 11/10/2023, 3:49  PM  ECHOCARDIOGRAM COMPLETE Result Date: 11/07/2023    ECHOCARDIOGRAM REPORT   Patient Name:   ANAHITA CUA Date of Exam: 11/07/2023 Medical Rec #:  [id number]     Height:       64.0 in Accession #:    [id number]    Weight:       124.6 lb Date of Birth:  1950-08-31    BSA:          1.600 m Patient Age:    72 years      BP:           117/71 mmHg Patient Gender: F             HR:           89 bpm. Exam Location:  Inpatient Procedure: 2D Echo, Cardiac Doppler and Color Doppler (Both Spectral and Color            Flow Doppler were utilized during procedure). Indications:    Abnormal ECG R94.31  History:        Patient has prior history of Echocardiogram examinations, most                 recent 10/27/2023.  Sonographer:    Terrilee Few RCS Referring Phys: Florie Husband Andrez Banker [facility]  Sonographer  Comments: Image acquisition challenging due to uncooperative patient. IMPRESSIONS  1. Left ventricular ejection fraction, by estimation, is 60 to 65%. The left ventricle has normal function. The left ventricle has no regional wall motion abnormalities. There is moderate concentric left ventricular hypertrophy. Left ventricular diastolic parameters are consistent with Grade I diastolic dysfunction (impaired relaxation).  2. Right ventricular systolic function is normal. The right ventricular size is normal. There is normal pulmonary artery systolic pressure. The estimated right ventricular systolic pressure is 25.3 mmHg.  3. The mitral valve is normal in structure. Trivial mitral valve regurgitation. No evidence of mitral stenosis.  4. The aortic valve is tricuspid. There is severe calcifcation of the aortic valve. Aortic valve regurgitation is not visualized. Moderate to severe aortic valve stenosis. Aortic valve mean gradient measures 26.0 mmHg, AVA 0.96 cm^2.  5. The inferior vena cava is normal in size with greater than 50% respiratory variability, suggesting right atrial pressure of 3 mmHg. FINDINGS  Left Ventricle: Left  ventricular ejection fraction, by estimation, is 60 to 65%. The left ventricle has normal function. The left ventricle has no regional wall motion abnormalities. The left ventricular internal cavity size was normal in size. There is  moderate concentric left ventricular hypertrophy. Left ventricular diastolic parameters are consistent with Grade I diastolic dysfunction (impaired relaxation). Right Ventricle: The right ventricular size is normal. No increase in right ventricular wall thickness. Right ventricular systolic function is normal. There is normal pulmonary artery systolic pressure. The tricuspid regurgitant velocity is 2.36 m/s, and  with an assumed right atrial pressure of 3 mmHg, the estimated right ventricular systolic pressure is 25.3 mmHg. Left Atrium: Left atrial size was normal in size. Right Atrium: Right atrial size was normal in size. Pericardium: There is no evidence of pericardial effusion. Mitral Valve: The mitral valve is normal in structure. Trivial mitral valve regurgitation. No evidence of mitral valve stenosis. Tricuspid Valve: The tricuspid valve is normal in structure. Tricuspid valve regurgitation is trivial. Aortic Valve: The aortic valve is tricuspid. There is severe calcifcation of the aortic valve. Aortic valve regurgitation is not visualized. Moderate to severe aortic stenosis is present. Aortic valve mean gradient measures 26.0 mmHg. Aortic valve peak gradient measures 39.4 mmHg. Aortic valve area, by VTI measures 1.08 cm. Pulmonic Valve: The pulmonic valve was normal in structure. Pulmonic valve regurgitation is not visualized. Aorta: The aortic root is normal in size and structure. Venous: The inferior vena cava is normal in size with greater than 50% respiratory variability, suggesting right atrial pressure of 3 mmHg. IAS/Shunts: No atrial level shunt detected by color flow Doppler.  LEFT VENTRICLE PLAX 2D LVIDd:         2.60 cm     Diastology LV PW:         1.20 cm     LV e'  medial:    6.53 cm/s LV IVS:        1.40 cm     LV E/e' medial:  11.7 LVOT diam:     2.10 cm     LV e' lateral:   6.53 cm/s LV SV:         55          LV E/e' lateral: 11.7 LV SV Index:   34 LVOT Area:     3.46 cm  LV Volumes (MOD) LV vol d, MOD A2C: 47.6 ml LV vol d, MOD A4C: 46.9 ml LV vol s, MOD A2C: 20.5 ml LV vol s, MOD A4C: 18.6  ml LV SV MOD A2C:     27.1 ml LV SV MOD A4C:     46.9 ml LV SV MOD BP:      30.3 ml RIGHT VENTRICLE             IVC RV S prime:     19.30 cm/s  IVC diam: 1.60 cm TAPSE (M-mode): 2.0 cm LEFT ATRIUM             Index        RIGHT ATRIUM           Index LA diam:        2.80 cm 1.75 cm/m   RA Area:     11.00 cm LA Vol (A2C):   26.3 ml 16.44 ml/m  RA Volume:   23.30 ml  14.56 ml/m LA Vol (A4C):   30.8 ml 19.25 ml/m LA Biplane Vol: 31.0 ml 19.38 ml/m  AORTIC VALVE AV Area (Vmax):    1.14 cm AV Area (Vmean):   1.09 cm AV Area (VTI):     1.08 cm AV Vmax:           314.00 cm/s AV Vmean:          217.333 cm/s AV VTI:            0.507 m AV Peak Grad:      39.4 mmHg AV Mean Grad:      26.0 mmHg LVOT Vmax:         103.00 cm/s LVOT Vmean:        68.300 cm/s LVOT VTI:          0.158 m LVOT/AV VTI ratio: 0.31  AORTA Ao Root diam: 3.40 cm Ao Asc diam:  3.60 cm MITRAL VALVE                TRICUSPID VALVE MV Area (PHT): 5.31 cm     TR Peak grad:   22.3 mmHg MV Decel Time: 143 msec     TR Vmax:        236.00 cm/s MV E velocity: 76.20 cm/s MV A velocity: 109.00 cm/s  SHUNTS MV E/A ratio:  0.70         Systemic VTI:  0.16 m                             Systemic Diam: 2.10 cm Dalton McleanMD Electronically signed by Archer Bear Signature Date/Time: 11/07/2023/1:37:34 PM    Final    DG Abd Portable 1V Result Date: 11/07/2023 CLINICAL DATA:  Abdominal pain. EXAM: PORTABLE ABDOMEN - 1 VIEW COMPARISON:  Radiograph 10/28/2023, CT 10/26/2023 FINDINGS: Tip of the weighted enteric tube in the region of the distal stomach. No bowel dilatation or evidence of obstruction. No significant formed stool in  the colon. Multiple vascular calcifications. IVC filter in place. 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 IMPRESSION: 1. No radiographic explanation for pain. Nonobstructive bowel gas pattern. 2. Tip of the weighted enteric tube in the region of the distal stomach. Electronically Signed   By: Chadwick Colonel M.D.   On: 11/07/2023 12:08   DG Chest Port 1 View Result Date: 11/07/2023 CLINICAL DATA:  Shortness of breath. EXAM: PORTABLE CHEST 1 VIEW COMPARISON:  10/31/2023 FINDINGS: Interval extubation. Left IJ central line has been removed in the interval. A feeding tube passes into the stomach although the distal tip position is not included on the film. The cardio pericardial silhouette is enlarged.  The lungs are clear without focal pneumonia, edema, pneumothorax or pleural effusion. Bones are diffusely demineralized. Telemetry leads overlie the chest. IMPRESSION: 1. Interval extubation and removal of left IJ central line. 2. No acute cardiopulmonary findings. Electronically Signed   By: Donnal Fusi M.D.   On: 11/07/2023 07:17   CT ANGIO HEAD NECK W WO CM Result Date: 11/05/2023 CLINICAL DATA:  Stroke workup EXAM: CT ANGIOGRAPHY HEAD AND NECK WITH AND WITHOUT CONTRAST TECHNIQUE: Multidetector CT imaging of the head and neck was performed using the standard protocol during bolus administration of intravenous contrast. Multiplanar CT image reconstructions and MIPs were obtained to evaluate the vascular anatomy. Carotid stenosis measurements (when applicable) are obtained utilizing NASCET criteria, using the distal internal carotid diameter as the denominator. RADIATION DOSE REDUCTION: This exam was performed according to the departmental dose-optimization program which includes automated exposure control, adjustment of the mA and/or kV according to patient size and/or use of iterative reconstruction technique. CONTRAST:  75mL OMNIPAQUE  IOHEXOL  350 MG/ML SOLN COMPARISON:  Brain MRI from yesterday and head CT from 3  days ago FINDINGS: CT HEAD FINDINGS Brain: Hemorrhagic contusions in subdural collection show no interval increase. No evidence of progressive white matter infarction best seen by recent MRI. No hydrocephalus or shift Vascular: No hyperdense vessel or unexpected calcification. Skull: Left posterior scalp swelling with linear fracture in the posterior left calvarium that is nondepressed. Sinuses/Orbits: Mild mastoid and sinus opacification in the setting of nasal intubation Review of the MIP images confirms the above findings CTA NECK FINDINGS Aortic arch: Atheromatous plaque with 3 vessel branching Right carotid system: Moderate mixed density plaque centered at the bifurcation without ulceration or significant stenosis. Left carotid system: Moderate mixed density plaque at the proximal left ICA without ulceration or flow reducing stenosis. Vertebral arteries: No proximal subclavian stenosis. Atheromatous plaque at both vertebral origins with right V1 stenosis approaching 50%. No dissection, beading, or ulceration. Skeleton: Generalized cervical spine degeneration.  No acute finding Other neck: No acute finding Upper chest: Clear apical lungs Review of the MIP images confirms the above findings CTA HEAD FINDINGS Anterior circulation: Mild atheromatous calcification on the cavernous carotids. Generalized medium vessel undulation attributed to atherosclerosis, no correctable in flow reducing proximal stenosis. No branch occlusion, beading, or aneurysm Posterior circulation: Left dominant vertebral artery. Vertebral and basilar arteries are sufficiently patent with moderate calcified plaque at the left V4 segment. Fetal type right PCA flow. No branch occlusion, beading, or aneurysm. Venous sinuses: Unremarkable for arterial timing Anatomic variants: As above Review of the MIP images confirms the above findings IMPRESSION: No emergent finding. Cervical and intracranial atherosclerosis with nearly 50% stenosis at the  right vertebral origin. Electronically Signed   By: Ronnette Coke M.D.   On: 11/05/2023 05:21   MR BRAIN WO CONTRAST Result Date: 11/04/2023 CLINICAL DATA:  Head trauma, moderate to severe. EXAM: MRI HEAD WITHOUT CONTRAST TECHNIQUE: Multiplanar, multiecho pulse sequences of the brain and surrounding structures were obtained without intravenous contrast. COMPARISON:  Head CT from 2 days ago FINDINGS: Brain: Small areas of restricted diffusion with nodular and bandlike appearance clustered in the deep right cerebral white matter, centered at the corona radiata, and in the left posterior frontal white matter. Some diffusion restriction along the surface of the posterior right temporal lobe which is likely contusion with susceptibility from subarachnoid blood products when compared with initial imaging. Seizure phenomenon is also considered, there has been EEG monitoring based on prior head CT. Known subdural hematoma along the right  more than left cerebral convexity, greatest at the anterior right frontal lobe measuring up to 6 mm thickness. Hemorrhagic contusion in the right anterior inferior frontotemporal region and low left cerebellum. Hygroma along the left frontal convexity up to 5 mm in thickness. 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 No hydrocephalus. Vascular: Major flow voids are preserved. Skull and upper cervical spine: Normal marrow signal. Sinuses/Orbits: Partial right mastoid opacification IMPRESSION: Small acute infarcts in the right more than left cerebral white matter. 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 Electronically Signed   By: Ronnette Coke M.D.   On: 11/04/2023 10:54   CT HEAD WO CONTRAST ( ) Result Date: 11/02/2023 CLINICAL DATA:  Subdural hematoma EXAM: CT HEAD WITHOUT CONTRAST TECHNIQUE: Contiguous axial images were obtained from the base of the skull through the vertex without intravenous contrast. RADIATION DOSE REDUCTION: This exam was  performed according to the departmental dose-optimization program which includes automated exposure control, adjustment of the mA and/or kV according to patient size and/or use of iterative reconstruction technique. COMPARISON:  Four days prior FINDINGS: Brain: Patchy subarachnoid hemorrhage along the cerebral convexities is non progressed. Hemorrhagic contusions in the anterior temporal and inferior frontal lobes on the right and in the left cerebellum. The inferior right frontal hematoma measures up to 2.9 cm. The left cerebellar hematoma measures 2.3 cm. Expected regional edema. Mixed density subdural hematoma along the left frontal convexity measures up to 5 mm in thickness without worrisome mass effect. No hydrocephalus, or shift. Perforator infarct in the right corona radiata Vascular: No hyperdense vessel or unexpected calcification. Skull: Nondepressed posterior left calvarium fracture with overlying soft tissue gas. Partial right mastoid opacification in the setting of intubation. No superimposed fracture seen. Sinuses/Orbits: Negative IMPRESSION: No progression of multifocal ICH as described. No mass effect or hydrocephalus. Perforator infarct in the right corona radiata that has occurred since admission. Electronically Signed   By: Ronnette Coke M.D.   On: 11/02/2023 04:05   IR IVC FILTER PLMT / S&I Dan Dun GUID/MOD SED Result Date: 10/31/2023 CLINICAL DATA:  Intracranial hemorrhage and acute right peroneal vein DVT. EXAM: 1. ULTRASOUND GUIDANCE FOR VASCULAR ACCESS OF THE RIGHT INTERNAL JUGULAR VEIN. 2. IVC VENOGRAM. 3. PERCUTANEOUS IVC FILTER PLACEMENT. ANESTHESIA/SEDATION: None CONTRAST:  30mL OMNIPAQUE  IOHEXOL  300 MG/ML  SOLN FLUOROSCOPY: Radiation Exposure Index: 17 mGy Kerma PROCEDURE: The procedure, risks, benefits, and alternatives were explained to the patient's husband. Questions regarding the procedure were encouraged and answered. The patient's husband understands and consents to the  procedure. A time-out was performed prior to initiating the procedure. The right neck was prepped with chlorhexidine  in a sterile fashion, and a sterile drape was applied covering the operative field. A sterile gown and sterile gloves were used for the procedure. Local anesthesia was provided with 1% Lidocaine . Ultrasound was utilized to confirm patency of the right internal jugular vein. An ultrasound image was saved and recorded. Under direct ultrasound guidance, a 21 gauge needle was advanced into the right internal jugular vein with ultrasound image documentation performed. After securing access with a micropuncture dilator, a guidewire was advanced into the inferior vena cava. A deployment sheath was advanced over the guidewire. This was utilized to perform IVC venography. The deployment sheath was further positioned in an appropriate location for filter deployment. A Bard Denali IVC filter was then advanced in the sheath. This was then fully deployed in the infrarenal IVC. Final filter position was confirmed with a fluoroscopic spot image. After the procedure  the sheath was removed and hemostasis obtained with manual compression. COMPLICATIONS: None. FINDINGS: IVC venography demonstrates a normal caliber IVC with no evidence of thrombus. Renal veins are identified bilaterally. The IVC filter was successfully positioned below the level of the renal veins and is appropriately oriented. This IVC filter has both permanent and retrievable indications. IMPRESSION: Placement of percutaneous IVC filter in infrarenal IVC. IVC venogram shows no evidence of IVC thrombus and normal caliber of the inferior vena cava. This filter does have both permanent and retrievable indications. PLAN: This IVC filter is potentially retrievable. The patient will be assessed for filter retrieval by Interventional Radiology in approximately 8-12 weeks. Further recommendations regarding filter retrieval, continued surveillance or  declaration of device permanence, will be made at that time. Electronically Signed   By: Erica Hau M.D.   On: 10/31/2023 17:35   VAS US  LOWER EXTREMITY VENOUS (DVT) Result Date: 10/31/2023  Lower Venous DVT Study Patient Name:  IZETTA SAKAMOTO  Date of Exam:   10/31/2023 Medical Rec #: [id number]      Accession #:    [id number] Date of Birth: 11-29-1950     Patient Gender: F Patient Age:   39 years Exam Location:  [facility] Procedure:      VAS US  LOWER EXTREMITY VENOUS (DVT) Referring Phys: Phyllis Breeze --------------------------------------------------------------------------------  Indications: Edema. Other Indications: Status post fall with resulting subdural and subarachnoid                    hemorrhage and also occipital skull fracture with large scalp                    hematoma. Comparison Study: No prior LEV on file Performing Technologist: Carleene Chase RVS  Examination Guidelines: A complete evaluation includes B-mode imaging, spectral Doppler, color Doppler, and power Doppler as needed of all accessible portions of each vessel. Bilateral testing is considered an integral part of a complete examination. Limited examinations for reoccurring indications may be performed as noted. The reflux portion of the exam is performed with the patient in reverse Trendelenburg.  +---------+---------------+---------+-----------+----------+--------------+ RIGHT    CompressibilityPhasicitySpontaneityPropertiesThrombus Aging +---------+---------------+---------+-----------+----------+--------------+ CFV      Full           Yes      Yes                                 +---------+---------------+---------+-----------+----------+--------------+ SFJ      Full                                                        +---------+---------------+---------+-----------+----------+--------------+ FV Prox  Full                                                         +---------+---------------+---------+-----------+----------+--------------+ FV Mid   Full                                                        +---------+---------------+---------+-----------+----------+--------------+  FV DistalFull                                                        +---------+---------------+---------+-----------+----------+--------------+ PFV      Full                                                        +---------+---------------+---------+-----------+----------+--------------+ POP      Full           Yes      Yes                                 +---------+---------------+---------+-----------+----------+--------------+ PTV      Full                                                        +---------+---------------+---------+-----------+----------+--------------+ PERO     None                                         Acute          +---------+---------------+---------+-----------+----------+--------------+   +---------+---------------+---------+-----------+----------+-------------------+ LEFT     CompressibilityPhasicitySpontaneityPropertiesThrombus Aging      +---------+---------------+---------+-----------+----------+-------------------+ CFV      Full           Yes      Yes                                      +---------+---------------+---------+-----------+----------+-------------------+ SFJ      Full                                                             +---------+---------------+---------+-----------+----------+-------------------+ FV Prox  Full                                                             +---------+---------------+---------+-----------+----------+-------------------+ FV Mid   Full           Yes      Yes                                      +---------+---------------+---------+-----------+----------+-------------------+ FV DistalFull                                                              +---------+---------------+---------+-----------+----------+-------------------+  PFV      Full                                                             +---------+---------------+---------+-----------+----------+-------------------+ POP      Full           Yes      Yes                                      +---------+---------------+---------+-----------+----------+-------------------+ PTV      Full                                                             +---------+---------------+---------+-----------+----------+-------------------+ PERO                                                  Not well visualized +---------+---------------+---------+-----------+----------+-------------------+     Summary: RIGHT: - Findings consistent with acute deep vein thrombosis involving the right peroneal veins.   LEFT: - No evidence of deep vein thrombosis in the lower extremity. No indirect evidence of obstruction proximal to the inguinal ligament.   *See table(s) above for measurements and observations. Electronically signed by Runell Countryman on 10/31/2023 at 3:44:13 PM.    Final    CT Angio Chest Pulmonary Embolism (PE) W or WO Contrast Result Date: 10/31/2023 CLINICAL DATA:  Pulmonary embolism. EXAM: CT ANGIOGRAPHY CHEST WITH CONTRAST TECHNIQUE: Multidetector CT imaging of the chest was performed using the standard protocol during bolus administration of intravenous contrast. Multiplanar CT image reconstructions and MIPs were obtained to evaluate the vascular anatomy. RADIATION DOSE REDUCTION: This exam was performed according to the departmental dose-optimization program which includes automated exposure control, adjustment of the mA and/or kV according to patient size and/or use of iterative reconstruction technique. CONTRAST:  67mL OMNIPAQUE  IOHEXOL  350 MG/ML SOLN COMPARISON:  CT dated 10/26/2023. FINDINGS: Cardiovascular: There is no cardiomegaly or pericardial effusion. There is  coronary vascular calcification of the LAD. There is calcification of the aortic valve. Mild atherosclerotic calcification of the thoracic aorta. No aneurysmal dilatation or dissection. Evaluation of the pulmonary arteries is limited due to respiratory motion. No central pulmonary artery embolus identified. Mediastinum/Nodes: No hilar or mediastinal adenopathy. An enteric tube noted in the esophagus extending into the stomach. No mediastinal fluid collection. Lungs/Pleura: Large area of consolidation involving the left lower lobe with air bronchograms. There is a small left pleural effusion. Small areas of subpleural densities in the right lower lobe and lingula may represent atelectasis or pneumonia. There is no pneumothorax. Endotracheal tube approximately 3 cm above the carina. The central airways are patent. Upper Abdomen: Cirrhosis. Musculoskeletal: Osteopenia with degenerative changes spine. Compression fractures of the inferior T9 and superior T10 endplates as seen on the prior CT. No acute osseous pathology. Review of the MIP images confirms the above findings. IMPRESSION: 1. No CT evidence of central pulmonary artery  embolus. 2. Left lower lobe pneumonia.  Follow-up to resolution recommended. 3. Cirrhosis. 4.  Aortic Atherosclerosis (ICD10-I70.0). Electronically Signed   By: Angus Bark M.D.   On: 10/31/2023 15:01   DG Chest Port 1 View Result Date: 10/31/2023 CLINICAL DATA:  Acute respiratory failure. EXAM: PORTABLE CHEST 1 VIEW COMPARISON:  10/29/2023 FINDINGS: Endotracheal tube in satisfactory position. Left jugular catheter tip in the superior vena cava. Stable dense left lower lobe airspace opacity. Interval small amount of linear atelectasis in the left mid lung zone. Clear right lung. Nasogastric tube extending into the stomach. Mild scoliosis. IMPRESSION: 1. Stable dense left lower lobe atelectasis or pneumonia. 2. Interval small amount of linear atelectasis in the left mid lung zone. 3.  Support apparatus in satisfactory position. Electronically Signed   By: Catherin Closs M.D.   On: 10/31/2023 11:40   CT HEAD WO CONTRAST ( ) Result Date: 10/29/2023 CLINICAL DATA:  73 year old female with multifocal intracranial hemorrhage status post fall. EXAM: CT HEAD WITHOUT CONTRAST TECHNIQUE: Contiguous axial images were obtained from the base of the skull through the vertex without intravenous contrast. RADIATION DOSE REDUCTION: This exam was performed according to the departmental dose-optimization program which includes automated exposure control, adjustment of the mA and/or kV according to patient size and/or use of iterative reconstruction technique. COMPARISON:  Head CT yesterday and earlier. FINDINGS: Brain: There remains no midline shift. Confluent hemorrhagic contusions in the right anterior and inferior frontal and right anterior and lateral temporal lobes have not significantly changed. Small volume bilateral subarachnoid hemorrhage and questionable smaller left inferior temporal lobe hemorrhagic contusion (versus age coronal image 41) are stable. Superimposed hyperdense right anterior convexity small subdural hematoma and more broad-based but generally low-density left side subdural hematoma (4 mm) are stable. Confluent left inferior cerebellar hemorrhagic contusion with edema is stable. Basilar cisterns remain patent. Stable gray-white matter differentiation throughout the brain. No cortically based acute infarct identified. Vascular: Calcified atherosclerosis at the skull base. Skull: Stable nondisplaced left posterior convexity skull fracture extending to the left foramen magnum. Sinuses/Orbits: Visualized paranasal sinuses and mastoids are stable and well aerated. Other: Intubated and oral enteric tube partially visible. Left posterior convexity, right lateral convexity scalp hematomas which are not significantly changed. Orbits soft tissues appears stable and negative. Scalp EEG leads  remain. IMPRESSION: 1. Stable extensive multifocal posttraumatic intracranial hemorrhage since yesterday. No midline shift. Basilar cisterns remain patent. 2. No new intracranial abnormality. 3. Stable nondisplaced left posterior skull fracture. Scalp hematomas. Electronically Signed   By: Marlise Simpers M.D.   On: 10/29/2023 09:24   DG Chest Port 1 View Result Date: 10/29/2023 CLINICAL DATA:  [id number]. Acute respiratory failure, ventilator dependent. EXAM: PORTABLE CHEST 1 VIEW COMPARISON:  Portable chest yesterday at 8:13 a.m. FINDINGS: 4:13 a.m. ETT tip is 5.4 cm from the carina, previously 4.1 cm. Left IJ line terminates at the superior cavoatrial junction. NGT enters the stomach with the intragastric course not evaluated. The heart is slightly enlarged. No vascular congestion is seen. Stable mediastinum with aortic atherosclerosis. Dense left lower lobe consolidation continues to be seen and there are bilateral minimal pleural effusions. The lungs are otherwise clear. Overall aeration seems unchanged. Osteopenia and thoracic spondylosis. IMPRESSION: 1. ETT tip 5.4 cm from the carina, previously 4.1 cm. 2. Left IJ line terminates at the superior cavoatrial junction. 3. NGT enters the stomach with the intragastric course not evaluated. 4. Dense left lower lobe consolidation and bilateral minimal pleural effusions. 5. Aortic atherosclerosis. Electronically Signed   By:  Denman Fischer M.D.   On: 10/29/2023 04:46   CT HEAD WO CONTRAST ( ) Result Date: 10/28/2023 CLINICAL DATA:  73 year old female with multifocal intracranial hemorrhage, status post fall. EXAM: CT HEAD WITHOUT CONTRAST TECHNIQUE: Contiguous axial images were obtained from the base of the skull through the vertex without intravenous contrast. RADIATION DOSE REDUCTION: This exam was performed according to the departmental dose-optimization program which includes automated exposure control, adjustment of the mA and/or kV according to patient size and/or  use of iterative reconstruction technique. COMPARISON:  Head CTs yesterday and earlier. FINDINGS: Brain: Right side anterior frontal convexity predominant mostly hyperdense subdural hematoma is 5-6 mm, stable. Superimposed confluent right inferior frontal gyrus and right anterior temporal lobe hemorrhagic contusions, in an area of slightly larger than 4 cm, stable. Surrounding edema stable. Bilateral subarachnoid hemorrhage, most apparent along the sylvian fissures, asymmetrically greater on the right, stable. Left inferolateral cerebellar hemorrhagic contusion which is about 2.5 cm, stable. Small volume hyperdense blood layering on the tentorium, stable. A smaller predominantly low-density left side subdural hematoma measuring up to 4 mm is new since presentation and increased since yesterday, most apparent on coronal image 47. Small volume IVH limited to the 4th ventricle is stable. No ventriculomegaly. No significant midline shift. Basilar cisterns remain patent. Stable gray-white matter differentiation throughout the brain. No cortically based acute infarct identified. Vascular: Calcified atherosclerosis at the skull base. No suspicious intracranial vascular hyperdensity. Skull: Linear nondisplaced skull fracture tracking lateral to the left lambdoid suture from the inferior parietal bone region through to the left foramen magnum. No new osseous abnormality identified. Sinuses/Orbits: Visualized paranasal sinuses and mastoids are stable and well aerated. Other: Large, broad-based left posterior convexity scalp hematoma does not appear significantly changed. Underlying nondisplaced skull fracture. Orbits soft tissues appears stable.  Bilateral scalp EEG leads now. IMPRESSION: 1. Extensive multifocal posttraumatic intracranial hemorrhage, stable since yesterday with EXCEPT FOR: Small 4 mm mixed but predominantly low-density left Subdural Hematoma has increased (coronal image 46). 2. No midline shift or significant  intracranial mass effect despite the findings. No ventriculomegaly. Stable basilar cisterns. 3. Nondisplaced left posterior convexity skull fracture with large overlying scalp hematoma. Electronically Signed   By: Marlise Simpers M.D.   On: 10/28/2023 10:02   DG CHEST PORT 1 VIEW Result Date: 10/28/2023 CLINICAL DATA:  [id number] Endotracheal tube present [id number] EXAM: PORTABLE CHEST 1 VIEW COMPARISON:  10/28/2023, 6:19 a.m. FINDINGS: Re-demonstration of left retrocardiac airspace opacity obscuring the left hemidiaphragm, descending thoracic aorta and blunting the left lateral costophrenic angle, suggesting combination of left lung atelectasis and/or consolidation with pleural effusion. No significant interval change. Bilateral lung fields are clear. Bilateral costophrenic angles are clear. Stable cardio-mediastinal silhouette. No acute osseous abnormalities. The soft tissues are within normal limits. Tube/lines: Endotracheal tube is seen with its tip approximately 4.1 cm above the carina. Enteric tube is seen coursing below the left hemidiaphragm however, the tip and side hole are not included in the film. Left IJ central venous catheter noted with its tip overlying the cavoatrial junction region. IMPRESSION: 1. Repositioning of the endotracheal tube. 2. Otherwise, no significant interval change since the prior study. Stable left retrocardiac opacity, as described above. 3. Support apparatus as described above. Electronically Signed   By: Beula Brunswick M.D.   On: 10/28/2023 08:27   DG Abd Portable 1V Result Date: 10/28/2023 CLINICAL DATA:  [id number] Encounter for orogastric (OG) tube placement [id number]. EXAM: PORTABLE ABDOMEN - 1 VIEW COMPARISON:  None Available. FINDINGS: The bowel gas  pattern is non-obstructive. No evidence of pneumoperitoneum, within the limitations of a supine film. No acute osseous abnormalities. The soft tissues are within normal limits. Surgical changes, devices, tubes and lines: Enteric tube is seen  coursing below the left hemidiaphragm with its side hole overlying the left side of the lumbar spine (stomach body region) and tip overlying the right lower quadrant, overlying the region of pylorus of the stomach. IMPRESSION: *Enteric tube is seen coursing below the left hemidiaphragm with its side hole overlying the left side of the lumbar spine (stomach body region) and tip overlying the right lower quadrant, overlying the region of pylorus of the stomach. Electronically Signed   By: Beula Brunswick M.D.   On: 10/28/2023 08:25   DG Chest Port 1 View Addendum Date: 10/28/2023 ADDENDUM REPORT: 10/28/2023 07:19 ADDENDUM: Critical Value/emergent results were called by telephone at the time of interpretation on 10/28/2023 at 7:18 am to provider Dr. Waylan Haggard, who verbally acknowledged these results. Electronically Signed   By: Donnal Fusi M.D.   On: 10/28/2023 07:19   Result Date: 10/28/2023 CLINICAL DATA:  Acute intracranial hemorrhage. Ventilator dependence. EXAM: PORTABLE CHEST 1 VIEW COMPARISON:  Earlier same day. FINDINGS: Left base collapse/consolidation with small left pleural effusion is similar to prior. Endotracheal tube tip is in the right mainstem bronchus, about 17 mm below the bottom of the carina. Endotracheal tube could be retracted approximately 4 cm to place the tip in the distal esophagus. Left IJ central line tip overlies the distal SVC level. The NG tube passes into the stomach although the distal tip position is not included on the film. Telemetry leads overlie the chest. IMPRESSION: 1. Endotracheal tube tip is in the right mainstem bronchus, about 17 mm below the bottom of the carina. Endotracheal tube could be retracted approximately 4 cm to place the tip in the distal esophagus. Repeat chest x-ray recommended after endotracheal tube repositioning. 2. Left base collapse/consolidation with small left pleural effusion, similar to prior. Electronically Signed: By: Donnal Fusi M.D. On:  10/28/2023 06:49   Overnight EEG with video Result Date: 10/28/2023 Arleene Lack, MD     10/29/2023  6:46 AM Patient Name: BUFORD GAYLER MRN: [id number] Epilepsy Attending: Arleene Lack Referring Physician/Provider: Josiah Nigh, MD Duration: 10/27/2023 1319 to 10/28/2023 1319 Patient history: 72yo F with right occipital fracture with acute subarachnoid hemorrhage, multiple contusions and a right subdural hematoma. EEG to evaluate for seizure Level of alertness: comatose/ lethargic AEDs during EEG study: LEV, propofol  Technical aspects: This EEG study was done with scalp electrodes positioned according to the 10-20 International system of electrode placement. Electrical activity was reviewed with band pass filter of 1-70Hz , sensitivity of 7 uV/mm, display speed of 74mm/sec with a 60Hz  notched filter applied as appropriate. EEG data were recorded continuously and digitally stored.  Video monitoring was available and reviewed as appropriate. Description: EEG showed continuous generalized and lateralized right hemisphere 3 to 6 Hz theta-delta slowing with overriding 12-14hz  beta activity. Hyperventilation and photic stimulation were not performed.   ABNORMALITY - Continuous slow, generalized and lateralized right hemisphere IMPRESSION: This study is suggestive of cortical dysfunction arising from right hemisphere likely secondary to underlying structural abnormality. Additionally there is moderate to severe diffuse encephalopathy likely related to sedation. No seizures or epileptiform discharges were seen throughout the recording. Priyanka Suzanne Erps   CT HEAD POST STROKE FOLLOWUP/TIMED/STAT READ Result Date: 10/27/2023 CLINICAL DATA:  Neuro deficit, acute, stroke suspected. EXAM: CT HEAD WITHOUT CONTRAST TECHNIQUE: Contiguous  axial images were obtained from the base of the skull through the vertex without intravenous contrast. RADIATION DOSE REDUCTION: This exam was performed according to the departmental  dose-optimization program which includes automated exposure control, adjustment of the mA and/or kV according to patient size and/or use of iterative reconstruction technique. COMPARISON:  Prior head CT examinations 10/27/2023 and earlier. FINDINGS: Brain: An acute subdural hematoma along the right cerebral convexity has undergone some interval redistribution but has not significantly changed in overall extent. The hematoma now measures up to 8 mm in thickness, and as before, is greatest along the right frontal lobe. Continued slight interval increase in extent of acute subarachnoid hemorrhage scattered along the right cerebral convexity. Progressive edema at sites of hemorrhagic parenchymal contusions within the anteroinferior right frontal lobe and anterior right temporal lobe. Small-volume subarachnoid hemorrhage scattered along the left cerebral convexity has mildly increased. Possible small underlying hemorrhagic parenchymal contusions within the anterolateral left temporal lobe, as before. Thin acute subdural hemorrhage layering along the tentorium bilaterally, unchanged. 2.3 x 2.6 cm acute parenchymal hemorrhage within the left cerebellar hemisphere, unchanged in size. Trace subarachnoid hemorrhage again questioned at the inferior aspect of the fourth ventricle (versus choroid plexus) (for instance as seen on series 5, image 28). No midline shift or hydrocephalus. Vascular: No hyperdense vessel.  Atherosclerotic calcifications. Skull: Known non-depressed fracture of the left occipital calvarium. Sinuses/Orbits: No mass or acute finding within the imaged orbits. Minimal mucosal thickening within the bilateral maxillary sinuses at the imaged levels. Minimal mucosal thickening within the bilateral sphenoid and right ethmoid sinuses. Other: Posterior scalp hematomas. IMPRESSION: 1. An acute subdural hematoma along the right cerebral convexity has undergone some interval redistribution but has not significantly  changed in overall extent. 2. Continued slight interval increase in extent of acute subarachnoid hemorrhage scattered along the right cerebral convexity. 3. Progressive edema at sites of hemorrhagic parenchymal contusions within the anteroinferior right frontal lobe and anterior right temporal lobe. 4. Small-volume subarachnoid hemorrhage scattered along the left cerebral convexity has mildly increased. Possible small underlying hemorrhagic parenchymal contusions within the anterolateral left temporal lobe, as before. 5. Thin acute subdural hemorrhage layering along the tentorium bilaterally, unchanged. 6. 2.3 x 2.6 cm acute parenchymal hemorrhage within the left cerebellar hemisphere, unchanged. 7. Trace subarachnoid hemorrhage again questioned at the inferior aspect of the fourth ventricle. 8. No midline shift or hydrocephalus. 9. Known non-depressed fracture of the left occipital calvarium. Electronically Signed   By: Bascom Lily D.O.   On: 10/27/2023 16:39   ECHOCARDIOGRAM COMPLETE Result Date: 10/27/2023    ECHOCARDIOGRAM REPORT   Patient Name:   ANASTAZIA CREEK Date of Exam: 10/27/2023 Medical Rec #:  [id number]     Height:       64.0 in Accession #:    [id number]    Weight:       121.3 lb Date of Birth:  1951/01/19    BSA:          1.582 m Patient Age:    72 years      BP:           186/91 mmHg Patient Gender: F             HR:           103 bpm. Exam Location:  Inpatient Procedure: 2D Echo, Cardiac Doppler and Color Doppler (Both Spectral and Color            Flow Doppler were utilized during procedure). Indications:  R55 Syncope  History:        Patient has prior history of Echocardiogram examinations, most                 recent 11/11/2020. Stroke; Risk Factors:Hypertension,                 Dyslipidemia and Diabetes. SDH.  Sonographer:    Raynelle Callow RDCS Referring Phys: [phone] Tania Familia [facility]  Sonographer Comments: Technically difficult study due to poor echo windows and echo performed with patient  supine and on artificial respirator. Patient with EEG adhesive leads in parasternal region. Patient intubated, restrained and moving throughout exam. IMPRESSIONS  1. Left ventricular ejection fraction, by estimation, is 65 to 70%. The left ventricle has normal function. The left ventricle has no regional wall motion abnormalities. There is severe left ventricular hypertrophy of the basal-septal segment. Left ventricular diastolic parameters are indeterminate. Elevated left atrial pressure.  2. Right ventricular systolic function is normal. The right ventricular size is normal. There is normal pulmonary artery systolic pressure. The estimated right ventricular systolic pressure is 28.1 mmHg.  3. The mitral valve is normal in structure. Trivial mitral valve regurgitation. No evidence of mitral stenosis.  4. The aortic valve has an indeterminant number of cusps. There is moderate calcification of the aortic valve. There is moderate thickening of the aortic valve. Aortic valve regurgitation is not visualized. Moderate aortic valve stenosis. Aortic valve area, by VTI measures 1.10 cm. Aortic valve mean gradient measures 32.0 mmHg. Aortic valve Vmax measures 3.26 m/s.  5. The inferior vena cava is dilated in size with >50% respiratory variability, suggesting right atrial pressure of 8 mmHg. FINDINGS  Left Ventricle: Left ventricular ejection fraction, by estimation, is 65 to 70%. The left ventricle has normal function. The left ventricle has no regional wall motion abnormalities. The left ventricular internal cavity size was normal in size. There is  severe left ventricular hypertrophy of the basal-septal segment. Left ventricular diastolic parameters are indeterminate. Elevated left atrial pressure. Right Ventricle: The right ventricular size is normal. No increase in right ventricular wall thickness. Right ventricular systolic function is normal. There is normal pulmonary artery systolic pressure. The tricuspid  regurgitant velocity is 2.24 m/s, and  with an assumed right atrial pressure of 8 mmHg, the estimated right ventricular systolic pressure is 28.1 mmHg. Left Atrium: Left atrial size was normal in size. Right Atrium: Right atrial size was normal in size. Pericardium: There is no evidence of pericardial effusion. Mitral Valve: The mitral valve is normal in structure. Mild to moderate mitral annular calcification. Trivial mitral valve regurgitation. No evidence of mitral valve stenosis. MV peak gradient, 6.2 mmHg. The mean mitral valve gradient is 3.0 mmHg. Tricuspid Valve: The tricuspid valve is normal in structure. Tricuspid valve regurgitation is not demonstrated. No evidence of tricuspid stenosis. Aortic Valve: The aortic valve has an indeterminant number of cusps. There is moderate calcification of the aortic valve. There is moderate thickening of the aortic valve. Aortic valve regurgitation is not visualized. Moderate aortic stenosis is present.  Aortic valve mean gradient measures 32.0 mmHg. Aortic valve peak gradient measures 42.6 mmHg. Aortic valve area, by VTI measures 1.10 cm. Pulmonic Valve: The pulmonic valve was normal in structure. Pulmonic valve regurgitation is not visualized. No evidence of pulmonic stenosis. Aorta: The aortic root is normal in size and structure. Venous: The inferior vena cava is dilated in size with greater than 50% respiratory variability, suggesting right atrial pressure of 8 mmHg. IAS/Shunts:  No atrial level shunt detected by color flow Doppler.  LEFT VENTRICLE PLAX 2D LVIDd:         3.70 cm     Diastology LVIDs:         2.40 cm     LV e' medial:    4.57 cm/s LV PW:         0.70 cm     LV E/e' medial:  20.0 LV IVS:        1.80 cm     LV e' lateral:   5.66 cm/s LVOT diam:     2.10 cm     LV E/e' lateral: 16.2 LV SV:         60 LV SV Index:   38 LVOT Area:     3.46 cm  LV Volumes (MOD) LV vol d, MOD A2C: 77.1 ml LV vol d, MOD A4C: 68.4 ml LV vol s, MOD A2C: 21.9 ml LV vol s, MOD  A4C: 25.9 ml LV SV MOD A2C:     55.2 ml LV SV MOD A4C:     68.4 ml LV SV MOD BP:      53.0 ml RIGHT VENTRICLE             IVC RV S prime:     14.70 cm/s  IVC diam: 2.10 cm TAPSE (M-mode): 2.1 cm LEFT ATRIUM             Index        RIGHT ATRIUM           Index LA diam:        3.30 cm 2.09 cm/m   RA Area:     10.80 cm LA Vol (A2C):   21.5 ml 13.59 ml/m  RA Volume:   21.60 ml  13.66 ml/m LA Vol (A4C):   40.5 ml 25.61 ml/m LA Biplane Vol: 30.7 ml 19.41 ml/m  AORTIC VALVE AV Area (Vmax):    1.18 cm AV Area (Vmean):   1.09 cm AV Area (VTI):     1.10 cm AV Vmax:           326.25 cm/s AV Vmean:          232.750 cm/s AV VTI:            0.543 m AV Peak Grad:      42.6 mmHg AV Mean Grad:      32.0 mmHg LVOT Vmax:         111.00 cm/s LVOT Vmean:        73.100 cm/s LVOT VTI:          0.172 m LVOT/AV VTI ratio: 0.32  AORTA Ao Root diam: 3.50 cm Ao Asc diam:  3.45 cm MITRAL VALVE                TRICUSPID VALVE MV Area (PHT): 4.37 cm     TR Peak grad:   20.1 mmHg MV Area VTI:   2.64 cm     TR Vmax:        224.00 cm/s MV Peak grad:  6.2 mmHg MV Mean grad:  3.0 mmHg     SHUNTS MV Vmax:       1.25 m/s     Systemic VTI:  0.17 m MV Vmean:      79.1 cm/s    Systemic Diam: 2.10 cm MV Decel Time: 174 msec MV E velocity: 91.50 cm/s MV A velocity: 113.67 cm/s MV E/A ratio:  0.80 Traci Turner  MD Electronically signed by Gaylyn Keas MD Signature Date/Time: 10/27/2023/3:21:35 PM    Final    DG Abdomen 1 View Result Date: 10/27/2023 CLINICAL DATA:  OG tube placement EXAM: ABDOMEN - 1 VIEW COMPARISON:  Oct 26, 2023 FINDINGS: Esophagogastric tube terminates in the stomach. Nonobstructive bowel gas pattern. No pneumoperitoneum. No organomegaly or radiopaque calculi. Multilevel thoracic osteophytosis. Atherosclerosis. IMPRESSION: Well-positioned esophagogastric tube terminates in the stomach. Electronically Signed   By: Rance Burrows M.D.   On: 10/27/2023 12:04   DG Chest Portable 1 View Result Date: 10/27/2023 CLINICAL DATA:   ETT tube placement EXAM: PORTABLE CHEST - 1 VIEW COMPARISON:  Oct 26, 2023 FINDINGS: Suboptimal evaluation of the endotracheal tube due to overlying cardiac wires. It appears that the tip of the endotracheal tube is within the ostium of the right mainstem bronchus. Left IJ approach central venous catheter terminates at the cavoatrial junction. Esophagogastric tube courses below the diaphragm with the distal tip not included in the field of view. The last side hole overlies the expected region of the stomach. Trace left pleural effusion. Streaky left basilar atelectasis. No pneumothorax. No cardiomegaly. Tortuous aorta with aortic atherosclerosis. IMPRESSION: Low lying endotracheal tube, which is suboptimally evaluated due to overlying defibrillator pad and cardiac wires. It appears that the tip of the endotracheal tube is within the ostium of the right mainstem bronchus. Repeat chest radiograph recommended for further characterization after 2 cm of retraction. Well-positioned left IJ central venous catheter.  No pneumothorax. Electronically Signed   By: Rance Burrows M.D.   On: 10/27/2023 12:03   CT HEAD WO CONTRAST ( ) Addendum Date: 10/27/2023 ADDENDUM REPORT: 10/27/2023 09:03 ADDENDUM: These results were called by telephone at the time of interpretation on 10/27/2023 at 9:03 am to provider Dr. Nichole Barker, who verbally acknowledged these results. Electronically Signed   By: Bascom Lily D.O.   On: 10/27/2023 09:03   Result Date: 10/27/2023 CLINICAL DATA:  Head trauma, intracranial arterial injury suspected. EXAM: CT HEAD WITHOUT CONTRAST TECHNIQUE: Contiguous axial images were obtained from the base of the skull through the vertex without intravenous contrast. RADIATION DOSE REDUCTION: This exam was performed according to the departmental dose-optimization program which includes automated exposure control, adjustment of the mA and/or kV according to patient size and/or use of iterative reconstruction technique.  COMPARISON:  Head CT 10/26/2023. FINDINGS: Brain: Interval increase in size of an acute subdural hematoma along the right cerebral convexity (greatest along the anterior right frontal lobe), now measuring up to 9 mm in thickness (previously 7 mm). Acute subarachnoid hemorrhage scattered along the right cerebral convexity, increased. Hemorrhagic parenchymal contusions within the anterior right temporal lobe and anteroinferior right frontal lobe have increased in size. For instance, the largest hemorrhagic parenchymal contusion within the anterior right temporal lobe now measures 18 mm (previously 11 mm) (series 2, image 16). Trace acute subarachnoid hemorrhage is present along the anterolateral left temporal lobe, new from the prior exam (series 2, image 15). Possible small underlying left temporal lobe hemorrhagic parenchymal contusions. Thin subdural hemorrhage layering along the tentorium, bilaterally, similar to the prior exam. A 2.3 x 2.6 cm hemorrhagic parenchymal contusion within the left cerebellar hemisphere has increased in size (previously 2.1 x 1.8 cm). Trace subarachnoid hemorrhage questioned within the inferior aspect of the fourth ventricle (versus prominent choroid plexus). No midline shift or hydrocephalus. Vascular: No hyperdense vessel.  Atherosclerotic calcifications. Skull: Non-depressed fracture of the left occipital calvarium again noted. Sinuses/Orbits: No orbital mass or acute orbital finding. Mild mucosal thickening  within the right ethmoid and left maxillary sinuses. Other: Left occipital scalp hematoma and laceration. Attempts are being made to reach the ordering provider at this time. IMPRESSION: 1. Interval increase in size of an acute subdural hematoma along the right cerebral convexity (greatest along the anterior right frontal lobe), now measuring up to 9 mm in thickness (previously 7 mm). 2. Acute subarachnoid hemorrhage scattered along the right cerebral convexity, increased. 3.  Hemorrhagic parenchymal contusions within the anterior right temporal lobe and anteroinferior right frontal lobe have increased in size. 4. Trace acute subarachnoid hemorrhage along the anterolateral left temporal lobe, new from the prior exam. Possible small underlying left temporal lobe hemorrhagic parenchymal contusions. 5. A 2.3 x 2.6 cm hemorrhagic parenchymal contusion within the left cerebellar hemisphere has increased in size (previously 2.1 x 1.8 cm). 6. Thin subdural hemorrhage layering along the tentorium, bilaterally, similar to the prior exam. 7. Trace subarachnoid hemorrhage (versus prominent choroid plexus) within the inferior aspect of the fourth ventricle. 8. Non-depressed fracture of the left occipital calvarium. 9. Left occipital scalp hematoma and laceration. Electronically Signed: By: Bascom Lily D.O. On: 10/27/2023 08:44   CT HEAD WO CONTRAST Result Date: 10/26/2023 CLINICAL DATA:  Fall EXAM: CT HEAD WITHOUT CONTRAST CT CERVICAL SPINE WITHOUT CONTRAST TECHNIQUE: Multidetector CT imaging of the head and cervical spine was performed following the standard protocol without intravenous contrast. Multiplanar CT image reconstructions of the cervical spine were also generated. RADIATION DOSE REDUCTION: This exam was performed according to the departmental dose-optimization program which includes automated exposure control, adjustment of the mA and/or kV according to patient size and/or use of iterative reconstruction technique. COMPARISON:  None Available. FINDINGS: CT HEAD FINDINGS Brain: There is mixed subdural and subarachnoid hemorrhage of the right hemisphere, predominantly anterior. There is a small amount of subdural blood layering along the falx cerebrum. No midline shift or other mass effect. No hydrocephalus. Vascular: Atherosclerotic calcification of the vertebral and internal carotid arteries at the skull base. No abnormal hyperdensity of the major intracranial arteries or dural venous  sinuses. Skull: Large left posterior scalp hematoma. Nondisplaced left occipital skull fracture. Sinuses/Orbits: No fluid levels or advanced mucosal thickening of the visualized paranasal sinuses. No mastoid or middle ear effusion. Normal orbits. Other: None. CT CERVICAL SPINE FINDINGS Alignment: Grade 1 anterolisthesis at C4-5. Skull base and vertebrae: No acute fracture. Soft tissues and spinal canal: No prevertebral fluid or swelling. No visible canal hematoma. Disc levels: No advanced spinal canal or neural foraminal stenosis. Upper chest: No pneumothorax, pulmonary nodule or pleural effusion. Other: Normal visualized paraspinal cervical soft tissues. IMPRESSION: 1. Mixed subdural and subarachnoid hemorrhage of the right hemisphere, predominantly anterior. No midline shift or other mass effect. 2. Nondisplaced left occipital skull fracture with large left posterior scalp hematoma. 3. No acute fracture or static subluxation of the cervical spine. Critical Value/emergent results were called by telephone at the time of interpretation on 10/26/2023 at 11:50 pm to provider JONATHAN GILLIAM , who verbally acknowledged these results. Electronically Signed   By: Juanetta Nordmann M.D.   On: 10/26/2023 23:51   CT CERVICAL SPINE WO CONTRAST Result Date: 10/26/2023 CLINICAL DATA:  Fall EXAM: CT HEAD WITHOUT CONTRAST CT CERVICAL SPINE WITHOUT CONTRAST TECHNIQUE: Multidetector CT imaging of the head and cervical spine was performed following the standard protocol without intravenous contrast. Multiplanar CT image reconstructions of the cervical spine were also generated. RADIATION DOSE REDUCTION: This exam was performed according to the departmental dose-optimization program which includes automated exposure control,  adjustment of the mA and/or kV according to patient size and/or use of iterative reconstruction technique. COMPARISON:  None Available. FINDINGS: CT HEAD FINDINGS Brain: There is mixed subdural and subarachnoid  hemorrhage of the right hemisphere, predominantly anterior. There is a small amount of subdural blood layering along the falx cerebrum. No midline shift or other mass effect. No hydrocephalus. Vascular: Atherosclerotic calcification of the vertebral and internal carotid arteries at the skull base. No abnormal hyperdensity of the major intracranial arteries or dural venous sinuses. Skull: Large left posterior scalp hematoma. Nondisplaced left occipital skull fracture. Sinuses/Orbits: No fluid levels or advanced mucosal thickening of the visualized paranasal sinuses. No mastoid or middle ear effusion. Normal orbits. Other: None. CT CERVICAL SPINE FINDINGS Alignment: Grade 1 anterolisthesis at C4-5. Skull base and vertebrae: No acute fracture. Soft tissues and spinal canal: No prevertebral fluid or swelling. No visible canal hematoma. Disc levels: No advanced spinal canal or neural foraminal stenosis. Upper chest: No pneumothorax, pulmonary nodule or pleural effusion. Other: Normal visualized paraspinal cervical soft tissues. IMPRESSION: 1. Mixed subdural and subarachnoid hemorrhage of the right hemisphere, predominantly anterior. No midline shift or other mass effect. 2. Nondisplaced left occipital skull fracture with large left posterior scalp hematoma. 3. No acute fracture or static subluxation of the cervical spine. Critical Value/emergent results were called by telephone at the time of interpretation on 10/26/2023 at 11:50 pm to provider JONATHAN GILLIAM , who verbally acknowledged these results. Electronically Signed   By: Juanetta Nordmann M.D.   On: 10/26/2023 23:51   CT CHEST ABDOMEN PELVIS W CONTRAST Result Date: 10/26/2023 CLINICAL DATA:  Blunt trauma, loss of balance, fell backwards and hit head, hypertension EXAM: CT CHEST, ABDOMEN, AND PELVIS WITH CONTRAST TECHNIQUE: Multidetector CT imaging of the chest, abdomen and pelvis was performed following the standard protocol during bolus administration of  intravenous contrast. RADIATION DOSE REDUCTION: This exam was performed according to the departmental dose-optimization program which includes automated exposure control, adjustment of the mA and/or kV according to patient size and/or use of iterative reconstruction technique. CONTRAST:  75mL OMNIPAQUE  IOHEXOL  350 MG/ML SOLN COMPARISON:  06/16/2016, 06/05/2023 FINDINGS: CT CHEST FINDINGS Cardiovascular: The heart is enlarged without pericardial effusion. No evidence of thoracic aortic aneurysm or dissection. No evidence of vascular injury. Atherosclerosis of the aorta and coronary vasculature. Mediastinum/Nodes: No enlarged mediastinal, hilar, or axillary lymph nodes. Thyroid  gland, trachea, and esophagus demonstrate no significant findings. Lungs/Pleura: No acute airspace disease, effusion, or pneumothorax. Central airways are patent. Musculoskeletal: There is an acute appearing fracture through the superior endplate of the T10 vertebral body, with less than 10% loss of height. No retropulsion. No other acute bony abnormalities. Chronic appearing compression deformity within the inferior endplate of the T9 vertebral body. Reconstructed images demonstrate no additional findings. CT ABDOMEN PELVIS FINDINGS Hepatobiliary: Nodular contour of the liver consistent with cirrhosis. No focal parenchymal liver abnormality. No evidence of hepatic injury. The gallbladder is unremarkable. No biliary duct dilation. Pancreas: Unremarkable. No pancreatic ductal dilatation or surrounding inflammatory changes. Spleen: The spleen is enlarged, measuring 11.8 x 11.7 x 6.0 cm. No focal parenchymal abnormality. Adrenals/Urinary Tract: No adrenal hemorrhage or renal injury identified. Bladder is unremarkable. Stomach/Bowel: No bowel obstruction or ileus. No bowel wall thickening or inflammatory change. Vascular/Lymphatic: Aortic atherosclerosis. No enlarged abdominal or pelvic lymph nodes. Reproductive: Multiple calcified uterine  fibroids. No adnexal masses. Other: No free fluid or free intraperitoneal gas. Small fat containing umbilical hernia. Musculoskeletal: Chronic appearing compression deformities are seen at T12, L2, L3,  L4, and L5, with no significant change since the prior exam. Reconstructed images demonstrate no additional findings. IMPRESSION: 1. Acute appearing compression deformity through the superior endplate of the T10 vertebral body, with less than 10% loss of vertebral body height. No retropulsion. 2. Otherwise no acute intrathoracic, intra-abdominal, or intrapelvic trauma. 3. 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 4. Other chronic thoracolumbar compression deformities as above. 5.  Aortic Atherosclerosis (ICD10-I70.0). 6. Cardiomegaly without pericardial effusion. Electronically Signed   By: Bobbye Burrow M.D.   On: 10/26/2023 23:38   DG Chest Port 1 View Result Date: 10/26/2023 CLINICAL DATA:  Fall, chest are EXAM: PORTABLE CHEST 1 VIEW COMPARISON:  None Available. FINDINGS: Lungs are well expanded, symmetric, and clear. No pneumothorax or pleural effusion. Cardiac size within normal limits. Pulmonary vascularity is normal. Osseous structures are age-appropriate. No acute bone abnormality. IMPRESSION: 1. No active disease. Electronically Signed   By: Worthy Heads M.D.   On: 10/26/2023 22:26   (Echo, Carotid, EGD, Colonoscopy, ERCP)    Subjective: Patient had hypoglycemic event overnight requiring D50 push, otherwise she denies any complaints  Discharge Exam: Vitals:   11/11/23 0821 11/11/23 0838  BP: 132/70 (!) 139/102  Pulse: 95 95  Resp: (!) 29 (!) 31  Temp: 98.3 F (36.8 C)   SpO2:     Vitals:   11/11/23 0400 11/11/23 0500 11/11/23 0821 11/11/23 0838  BP: (!) 122/93  132/70 (!) 139/102  Pulse:   95 95  Resp: 18  (!) 29 (!) 31  Temp: 97.9 F (36.6 C)  98.3 F (36.8 C)   TempSrc: Oral  Oral Oral  SpO2:      Weight:  54.2 kg    Height:        General:  Pt is alert, awake, not in acute distress, still has core track Cardiovascular: RRR, S1/S2 +, no rubs, no gallops Respiratory: CTA bilaterally, no wheezing, no rhonchi Abdominal: Soft, NT, ND, bowel sounds + Extremities: no edema, no cyanosis    The results of significant diagnostics from this hospitalization (including imaging, microbiology, ancillary and laboratory) are listed below for reference.     Microbiology: No results found for this or any previous visit (from the past 240 hours).   Labs: BNP (last 3 results) Recent Labs    11/09/23 0437 11/10/23 0556 11/11/23 0513  BNP 129.4* 84.6 62.0   Basic Metabolic Panel: Recent Labs  Lab 11/07/23 0455 11/08/23 0425 11/09/23 0437 11/10/23 0556 11/11/23 0513  NA 136 135 136 134* 138  K 4.2 3.8 3.8 4.1 3.1*  CL 103 107 104 106 106  CO2 19* 19* 21* 20* 21*  GLUCOSE 139* 201* 191* 308* 165*  BUN 20 15 15 15 14   CREATININE 0.45 0.48 0.44 0.51 0.50  CALCIUM  8.7* 7.9* 8.6* 8.3* 8.6*  MG 2.0 2.0 1.9 2.0 2.2  PHOS 3.4 4.0 3.1 3.3 4.0   Liver Function Tests: Recent Labs  Lab 11/08/23 0425 11/09/23 0437 11/10/23 0556 11/11/23 0513  AST 79* 66* 57* 55*  ALT 62* 61* 54* 49*  ALKPHOS 224* 248* 248* 238*  BILITOT 0.7 0.7 0.8 0.6  PROT 6.0* 6.8 6.9 7.3  ALBUMIN 2.4* 2.7* 2.7* 3.0*   No results for input(s): "LIPASE", "AMYLASE" in the last 168 hours. Recent Labs  Lab 11/07/23 0818 11/09/23 0437  AMMONIA 70* <13   CBC: Recent Labs  Lab 11/07/23 0455 11/08/23 0425 11/09/23 0437 11/10/23 0556 11/11/23 0513  WBC 9.2 7.5 6.5 6.6 6.9  NEUTROABS  6.5 4.8 4.1 4.0 4.3  HGB 10.5* 9.4* 10.2* 10.6* 11.2*  HCT 32.9* 29.7* 31.5* 32.8* 35.4*  MCV 101.5* 100.7* 100.0 99.4 101.1*  PLT 226 227 259 267 292   Cardiac Enzymes: No results for input(s): "CKTOTAL", "CKMB", "CKMBINDEX", "TROPONINI" in the last 168 hours. BNP: Invalid input(s): "POCBNP" CBG: Recent Labs  Lab 11/11/23 0006 11/11/23 0253 11/11/23 0408  11/11/23 0444 11/11/23 0819  GLUCAP 124* 65* 49* 201* 310*   D-Dimer No results for input(s): "DDIMER" in the last 72 hours. Hgb A1c No results for input(s): "HGBA1C" in the last 72 hours. Lipid Profile No results for input(s): "CHOL", "HDL", "LDLCALC", "TRIG", "CHOLHDL", "LDLDIRECT" in the last 72 hours. Thyroid  function studies No results for input(s): "TSH", "T4TOTAL", "T3FREE", "THYROIDAB" in the last 72 hours.  Invalid input(s): "FREET3" Anemia work up No results for input(s): "VITAMINB12", "FOLATE", "FERRITIN", "TIBC", "IRON ", "RETICCTPCT" in the last 72 hours. Urinalysis    Component Value Date/Time   COLORURINE YELLOW 10/27/2023 1255   APPEARANCEUR CLEAR 10/27/2023 1255   LABSPEC 1.018 10/27/2023 1255   PHURINE 6.0 10/27/2023 1255   GLUCOSEU >=500 (A) 10/27/2023 1255   GLUCOSEU NEGATIVE 08/01/2019 0907   HGBUR NEGATIVE 10/27/2023 1255   BILIRUBINUR NEGATIVE 10/27/2023 1255   BILIRUBINUR neg 12/28/2013 1209   KETONESUR 20 (A) 10/27/2023 1255   PROTEINUR 30 (A) 10/27/2023 1255   UROBILINOGEN 1.0 08/01/2019 0907   NITRITE NEGATIVE 10/27/2023 1255   LEUKOCYTESUR NEGATIVE 10/27/2023 1255   Sepsis Labs Recent Labs  Lab 11/08/23 0425 11/09/23 0437 11/10/23 0556 11/11/23 0513  WBC 7.5 6.5 6.6 6.9   Microbiology No results found for this or any previous visit (from the past 240 hours).   Time coordinating discharge: Over 30 minutes  SIGNED:   Seena Dadds, MD  [facility] 11/11/2023, 9:59 AM Pager   If 7PM-7AM, please contact night-coverage [email or web address]

## 2023-11-11 NOTE — Discharge Instructions (Signed)
Management per CIR 

## 2023-11-11 NOTE — H&P (Addendum)
 Physical Medicine and Rehabilitation Admission H&P        Chief Complaint  Patient presents with   Functional debility related to TBI      HPI: Miranda Cochran is a 73 year old female PMHx of HTN, DM type II, GAVE syndrome causing GI bleed, alcohol abuse, autoimmune hepatitis with cirrhosis of liver, chronically on prednisone , GERD, hiatal hernia, and PAD presented after falling and striking her head.  Patient was admitted on 10/26/2023  for TBI. ER labs showed hypokalemia with prolonged QTc on EKG, WBC 16.5, SBP > 200. CT significant for evidence of occipital skull fracture and traumatic subarachnoid hemorrhage and right subdural hematoma.  On 5/15 patient developed AMS and tachyarrhythmia.  Dr. Bonnita Buttner consulted, CT showed TBI with traumatic increasing right subdural hematoma, SAH and right temporal contusion. MRI showed small acute infarcts in bilateral cerebral hemispheres right more than left.  PCCM consulted Dr. Ardelle Kos intubated for worsening TBI, EEG revealed seizure activity, patient received Keppra /phenobarbital .  Serial head CT's have been stable and show improvement.     Hospital course significant for RLE DVT, IVC filter was placed by Dr. Nereida Banning she was not a candidate for anticoagulation given falls/TBI. Alk phos trended upwards requiring close monitoring of LFT's. CTA chest negative for PE but showed LLL PNA.  Dr. Stann Earnest consulted for runs of SVT that later terminated with adenosine .  Lopressor and amiodarone initiated via tube.  TTE showed normal EF and moderate AS.  Coretrak placed 5/21 due to expressive aphasia and dysphagia SLP following.  She continues to be limited by left-sided weakness, expressive aphasia and dysphagia. She is mod assist and was independent PTA.  CIR recommended due to functional decline.   Review of Systems  Constitutional: Negative.   HENT: Negative.    Eyes: Negative.   Respiratory: Negative.    Cardiovascular: Negative.  Negative for chest pain,  palpitations and leg swelling.  Gastrointestinal: Negative.   Genitourinary: Negative.   Musculoskeletal:  Positive for falls.  Skin: Negative.   Neurological:  Positive for weakness.  Endo/Heme/Allergies:  Positive for environmental allergies.  Psychiatric/Behavioral: Negative.          Past Medical History:  Diagnosis Date   Abnormal Pap smear of cervix      05-15-21 ascus hpv hr+   Allergy     Anxiety      on meds   Autoimmune hepatitis (HCC) 08/18/2016    08/2016 liver bx confirms, fibrosis not cirrhosis   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 08/29/2020   COVID 06/27/2020   Depression      on meds   DM (diabetes mellitus) (HCC)     Fracture, fibula     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   GERD (gastroesophageal reflux disease)      on meds   Hiatal hernia     Hx of adenomatous polyp of colon 07/07/2009   Hyperlipidemia      diet controlled   Hypertension      on meds - LASIX    IBS (irritable bowel syndrome)     Iron  deficiency anemia     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 Willy Harvest, adenimatous polyps, repeat in 5 yrs   ESOPHAGOGASTRODUODENOSCOPY   07/06/2021    per Dr. Willy Harvest, showed portal hypertensive gastropathy, repeat in 2 yrs   ESOPHAGOGASTRODUODENOSCOPY (EGD) WITH PROPOFOL  N/A 06/20/2017    Procedure: ESOPHAGOGASTRODUODENOSCOPY (EGD) WITH PROPOFOL ;  Surgeon: Kenney Peacemaker, MD;  Location: [facility];  Service: Endoscopy;  Laterality: N/A;  with Barrx   EYE SURGERY       IR IVC FILTER PLMT / S&I /IMG GUID/MOD  SED   10/31/2023   NASAL SEPTOPLASTY W/ TURBINOPLASTY       POLYPECTOMY   2015    CG-TA   TONSILLECTOMY                 Family History  Problem Relation Age of Onset   Stroke Mother     Hypertension Mother     Colon cancer Father 60   Liver cancer Father 58   Colon polyps Father 41   Heart disease Sister          hole in heart   Pancreatic cancer Maternal Aunt     Brain cancer Maternal Uncle     Stroke Maternal Grandmother     Hypertension Maternal Grandmother     Esophageal cancer Maternal Grandfather     Throat cancer Maternal Grandfather     Breast cancer Paternal Grandmother     Brain cancer Paternal Grandmother     Lung cancer Paternal Grandfather     Rectal cancer Neg Hx     Stomach cancer Neg Hx          Social History:  reports that she has never smoked. She has never used smokeless tobacco. She reports current alcohol use. She reports that she does not use drugs.     Allergies       Allergies  Allergen Reactions   Fentanyl  Anxiety      Made her feel very anxious, requests not to get it.   Levofloxacin Anxiety      Weird dreams    Amoxicillin  Diarrhea      Has patient had a PCN reaction causing immediate rash, facial/tongue/throat swelling, SOB or lightheadedness with hypotension: No Has patient had a PCN reaction causing severe rash involving mucus membranes or skin necrosis: No Has patient had a PCN reaction that required hospitalization: No Has patient had a PCN reaction occurring within the last 10 years: Yes If all of the above answers are "NO", then may proceed with Cephalosporin use.     Augmentin  [Amoxicillin -Pot Clavulanate] Other (See Comments)      diarrhea   Farxiga  [Dapagliflozin ] Diarrhea      Gi pain   Statins        Myalgia             Medications Prior to Admission  Medication Sig Dispense Refill   ALPRAZolam  (XANAX ) 0.5 MG tablet TAKE 1 TABLET(0.5 MG) BY MOUTH AT BEDTIME AS NEEDED FOR SLEEP 90 tablet 1   AMBULATORY NON FORMULARY  MEDICATION ACTIVE LIVER Artichoke leaf extract, Milk thistle, Turmeric Take 1 capsule by mouth once daily       aspirin  325 MG tablet Take 650 mg by mouth daily as needed for moderate pain (pain score 4-6) or headache.  cetirizine  (ZYRTEC ) 10 MG tablet Take 10 mg by mouth daily.       Cyanocobalamin  500 MCG CHEW Chew 500 mcg by mouth daily.       diphenhydrAMINE  (BENADRYL ) 25 mg capsule Take 25-50 mg by mouth daily as needed for allergies.        furosemide  (LASIX ) 40 MG tablet TAKE 1 TABLET(40 MG) BY MOUTH DAILY 90 tablet 3   ibuprofen (ADVIL) 200 MG tablet Take 400 mg by mouth as needed for mild pain (pain score 1-3) or moderate pain (pain score 4-6).       insulin  lispro (HUMALOG ) 100 UNIT/ML KwikPen 18-22 Units daily with lunch.       lansoprazole  (PREVACID ) 30 MG capsule TAKE 1 CAPSULE(30 MG) BY MOUTH DAILY 90 capsule 3   lisinopril  (ZESTRIL ) 10 MG tablet Take 1 tablet (10 mg total) by mouth daily. 90 tablet 0   methocarbamol (ROBAXIN) 500 MG tablet Take 500 mg by mouth every 6 (six) hours as needed.       Multiple Vitamin (MULTIVITAMIN PO) Take by mouth.       naloxone (NARCAN) nasal spray 4 mg/0.1 mL Spray the contents of 1 device into 1 nostril. Call 911. May repeat with 2nd device in alternate nostril if no response in 2-3 minutes.       predniSONE  (DELTASONE ) 10 MG tablet Take 10 mg by mouth daily with breakfast.       RYBELSUS 14 MG TABS Take 1 tablet by mouth every morning.       traZODone  (DESYREL ) 50 MG tablet TAKE 1 TABLET BY MOUTH EVERYDAY AT BEDTIME (Patient taking differently: Take 50 mg by mouth at bedtime as needed for sleep.) 90 tablet 1   tretinoin (RETIN-A) 0.025 % cream Apply topically at bedtime.       venlafaxine  XR (EFFEXOR -XR) 75 MG 24 hr capsule TAKE 1 CAPSULE BY MOUTH TWICE DAILY WITH A MEAL (Patient taking differently: Take 75 mg by mouth. TAKE 1 CAPSULE BY MOUTH DAILY WITH A MEAL) 180 capsule 3   [DISCONTINUED] Cholecalciferol 25 MCG (1000 UT) CHEW Chew 1,000  Units by mouth daily.       [DISCONTINUED] ezetimibe  (ZETIA ) 10 MG tablet TAKE 1 TABLET(10 MG) BY MOUTH DAILY 30 tablet 3              Home: Home Living Family/patient expects to be discharged to:: Private residence Living Arrangements: Spouse/significant other Available Help at Discharge: Family Type of Home: Apartment Home Access: Level entry Home Layout: One level Bathroom Shower/Tub: Engineer, manufacturing systems: Standard Home Equipment: Medical laboratory scientific officer - quad   Functional History: Prior Function Prior Level of Function : History of Falls (last six months), Independent/Modified Independent, Working/employed, Driving Mobility Comments: independent, active ADLs Comments: works in [employer], independent   Functional Status:  Mobility: Bed Mobility Overal bed mobility: Needs Assistance Bed Mobility: Rolling, Sidelying to Sit, Sit to Sidelying Rolling: Used rails, Contact guard assist Sidelying to sit: Mod assist Supine to sit: Max assist, HOB elevated, Used rails Sit to supine: Max assist, HOB elevated, Used rails Sit to sidelying: Min assist General bed mobility comments: CGA to roll Lt and Rt several times in bed to assist with peri-care due to incontinence of stool. Mod assist for trunk support to rise to EOB, multimodal cues to facilitate and sequence. Min assist for LE support back into bed with cues for technique. Transfers Overall transfer level: Needs assistance Equipment used: 1 person hand held assist Transfers: Sit to/from Stand, Bed to  chair/wheelchair/BSC Sit to Stand: Mod assist Bed to/from chair/wheelchair/BSC transfer type:: Step pivot Step pivot transfers: Mod assist Transfer via Lift Equipment: Stedy General transfer comment: Mod assist for boost to stand from bed, followed by step pivot tranfer to [facility] with cues and bil UE support through therapist. Needs multimodal cues to sequence and align with surface prior to sitting. Min assist to stand from [facility] and step  pivot transfer towards bed with improved control when directed while turning towards Rt side. Ambulation/Gait General Gait Details: unable at this time Pre-gait activities: Able to stand with min assist and take lateral steps along bed today with BIL UEs supported by therapist. LEs still with slight buckling but able to correct with min assist.   ADL: ADL Overall ADL's : Needs assistance/impaired Eating/Feeding: NPO Grooming: Oral care, Maximal assistance, Sitting Grooming Details (indicate cue type and reason): needing assist for normalized movement patterns of RUE for repetitive back and forth motion (able to initiate with increased time and cues but not sustain, for reducing undershooting/overshooting bringing toothbrush to mouth, and asisst for sitting balance. Upper Body Bathing: Maximal assistance, Sitting Lower Body Bathing: Total assistance, Sit to/from stand Upper Body Dressing : Moderate assistance, Bed level, Cueing for sequencing Upper Body Dressing Details (indicate cue type and reason): max due to restlessness/pt pulling at IV Lower Body Dressing: Maximal assistance, Sitting/lateral leans, Cueing for sequencing Lower Body Dressing Details (indicate cue type and reason): donned R sock with max A from bed level; total A for L Toilet Transfer: Total assistance, +2 for physical assistance, +2 for safety/equipment Toilet Transfer Details (indicate cue type and reason): for STS Toileting- Clothing Manipulation and Hygiene: +2 for physical assistance, +2 for safety/equipment, Bed level Toileting - Clothing Manipulation Details (indicate cue type and reason): found to have purewick failure (likely due to pt restless in bed) and incontinent of BM. +2 for rolling and linen change with pericare Functional mobility during ADLs: Total assistance General ADL Comments: bed level/sitting EOB for ADLs, poor sitting balance, restless, cueing for safety/sequencing    Cognition: Cognition Orientation Level: Oriented to person, Oriented to place Cognition Arousal: Alert Behavior During Therapy: Restless   Physical Exam: Blood pressure (!) 139/102, pulse 95, temperature 98.3 F (36.8 C), temperature source Oral, resp. rate (!) 31, height 5\' 4"  (1.626 m), weight 54.2 kg, SpO2 95%.  Constitutional: No apparent distress. Appropriate appearance for age.  Laying in bed. HENT: No JVD. Neck Supple. Trachea midline. Atraumatic, normocephalic. + Core track.  White plaques on tongue. Eyes: PERRLA. EOMI. Visual fields grossly intact.  Cardiovascular: RRR, no murmurs/rub/gallops. No Edema. Peripheral pulses 2+  Respiratory: CTAB. No rales, rhonchi, or wheezing. On RA.  Abdomen: + bowel sounds, normoactive. No distention or tenderness.  Skin:  Multiple bruises and abrasions noted to bilat upper/lower extremities.  Healing occipital scalp laceration.   MSK:      No apparent deformity.  Able to mobilize all 4 extremities       Neurologic exam:  Cognition: AAO to person, place; not time with options or cues + Receptive greater than expressive aphasia, nonverbal Memory: Moderate to severe deficits Insight: Poor insight into current condition.  Mood: Flat, lethargic, but appropriate. Sensation: To light touch intact in BL UEs and LEs  Reflexes: Hyporeflexic left upper and lower extremity.  2+ right upper and lower extremity.  Negative Hoffmann's, Babinski. CN: Mild left facial droop, mild left tongue deviation. Coordination: No apparent tremors. No ataxia on FTN bilaterally Spasticity: MAS 0 in all extremities.  Strength:                RUE: 4/5 SA, 4/5 EF, 4/5 EE, 4/5 WE, 4/5 FF, 4/5 FA                LUE:  4-/5 SA, 4-/5 EF, 4-/5 EE, 4-/5 WE, 4-/5 FF, 4-/5 FA                RLE: 4/5 HF, 4/5 KE, 4/5  DF, 4/5  EHL, 4/5  PF                 LLE:  4/5 HF, 4/5 KE, 1/5  DF, 1/5  EHL, 3/5  PF      Lab Results Last 48 Hours        Results for orders placed  or performed during the hospital encounter of 10/26/23 (from the past 48 hours)  Glucose, capillary     Status: Abnormal    Collection Time: 11/09/23  1:01 PM  Result Value Ref Range    Glucose-Capillary 230 (H) 70 - 99 mg/dL      Comment: Glucose reference range applies only to samples taken after fasting for at least 8 hours.  Glucose, capillary     Status: Abnormal    Collection Time: 11/09/23  4:34 PM  Result Value Ref Range    Glucose-Capillary 152 (H) 70 - 99 mg/dL      Comment: Glucose reference range applies only to samples taken after fasting for at least 8 hours.  Glucose, capillary     Status: Abnormal    Collection Time: 11/09/23  7:26 PM  Result Value Ref Range    Glucose-Capillary 141 (H) 70 - 99 mg/dL      Comment: Glucose reference range applies only to samples taken after fasting for at least 8 hours.  Glucose, capillary     Status: None    Collection Time: 11/10/23  1:05 AM  Result Value Ref Range    Glucose-Capillary 97 70 - 99 mg/dL      Comment: Glucose reference range applies only to samples taken after fasting for at least 8 hours.  Glucose, capillary     Status: Abnormal    Collection Time: 11/10/23  4:27 AM  Result Value Ref Range    Glucose-Capillary 235 (H) 70 - 99 mg/dL      Comment: Glucose reference range applies only to samples taken after fasting for at least 8 hours.  Magnesium      Status: None    Collection Time: 11/10/23  5:56 AM  Result Value Ref Range    Magnesium  2.0 1.7 - 2.4 mg/dL      Comment: Performed at [facility], [address], [place], [place] [address]  CBC with Differential/Platelet     Status: Abnormal    Collection Time: 11/10/23  5:56 AM  Result Value Ref Range    WBC 6.6 4.0 - 10.5 K/uL    RBC 3.30 (L) 3.87 - 5.11 MIL/uL    Hemoglobin 10.6 (L) 12.0 - 15.0 g/dL    HCT 60.4 (L) 54.0 - 46.0 %    MCV 99.4 80.0 - 100.0 fL    MCH 32.1 26.0 - 34.0 pg    MCHC 32.3 30.0 - 36.0 g/dL    RDW 98.1 19.1 - 47.8 %    Platelets  267 150 - 400 K/uL      Comment: REPEATED TO VERIFY    nRBC 0.0 0.0 - 0.2 %  Neutrophils Relative % 60 %    Neutro Abs 4.0 1.7 - 7.7 K/uL    Lymphocytes Relative 23 %    Lymphs Abs 1.5 0.7 - 4.0 K/uL    Monocytes Relative 11 %    Monocytes Absolute 0.7 0.1 - 1.0 K/uL    Eosinophils Relative 4 %    Eosinophils Absolute 0.3 0.0 - 0.5 K/uL    Basophils Relative 1 %    Basophils Absolute 0.0 0.0 - 0.1 K/uL    Immature Granulocytes 1 %    Abs Immature Granulocytes 0.06 0.00 - 0.07 K/uL      Comment: Performed at [facility], [address], [place], [place] [address]  Brain natriuretic peptide     Status: None    Collection Time: 11/10/23  5:56 AM  Result Value Ref Range    B Natriuretic Peptide 84.6 0.0 - 100.0 pg/mL      Comment: Performed at [facility], [address], [place], [place] [address]  Phosphorus     Status: None    Collection Time: 11/10/23  5:56 AM  Result Value Ref Range    Phosphorus 3.3 2.5 - 4.6 mg/dL      Comment: Performed at [facility], [address], [place], [place] [address]  Comprehensive metabolic panel with GFR     Status: Abnormal    Collection Time: 11/10/23  5:56 AM  Result Value Ref Range    Sodium 134 (L) 135 - 145 mmol/L    Potassium 4.1 3.5 - 5.1 mmol/L    Chloride 106 98 - 111 mmol/L    CO2 20 (L) 22 - 32 mmol/L    Glucose, Bld 308 (H) 70 - 99 mg/dL      Comment: Glucose reference range applies only to samples taken after fasting for at least 8 hours.    BUN 15 8 - 23 mg/dL    Creatinine, Ser 4.13 0.44 - 1.00 mg/dL    Calcium  8.3 (L) 8.9 - 10.3 mg/dL    Total Protein 6.9 6.5 - 8.1 g/dL    Albumin 2.7 (L) 3.5 - 5.0 g/dL    AST 57 (H) 15 - 41 U/L    ALT 54 (H) 0 - 44 U/L    Alkaline Phosphatase 248 (H) 38 - 126 U/L    Total Bilirubin 0.8 0.0 - 1.2 mg/dL    GFR, Estimated >24 >40 mL/min      Comment: (NOTE) Calculated using the CKD-EPI Creatinine Equation (2021)      Anion gap 8 5 - 15      Comment: Performed at  [facility], [address] [place], [place] [address]  Glucose, capillary     Status: Abnormal    Collection Time: 11/10/23  8:24 AM  Result Value Ref Range    Glucose-Capillary 226 (H) 70 - 99 mg/dL      Comment: Glucose reference range applies only to samples taken after fasting for at least 8 hours.  Glucose, capillary     Status: Abnormal    Collection Time: 11/10/23 11:37 AM  Result Value Ref Range    Glucose-Capillary 188 (H) 70 - 99 mg/dL      Comment: Glucose reference range applies only to samples taken after fasting for at least 8 hours.  Glucose, capillary     Status: Abnormal    Collection Time: 11/10/23  3:46 PM  Result Value Ref Range    Glucose-Capillary 203 (H) 70 -  99 mg/dL      Comment: Glucose reference range applies only to samples taken after fasting for at least 8 hours.  Glucose, capillary     Status: Abnormal    Collection Time: 11/10/23  7:54 PM  Result Value Ref Range    Glucose-Capillary 251 (H) 70 - 99 mg/dL      Comment: Glucose reference range applies only to samples taken after fasting for at least 8 hours.  Glucose, capillary     Status: Abnormal    Collection Time: 11/11/23 12:06 AM  Result Value Ref Range    Glucose-Capillary 124 (H) 70 - 99 mg/dL      Comment: Glucose reference range applies only to samples taken after fasting for at least 8 hours.  Glucose, capillary     Status: Abnormal    Collection Time: 11/11/23  2:53 AM  Result Value Ref Range    Glucose-Capillary 65 (L) 70 - 99 mg/dL      Comment: Glucose reference range applies only to samples taken after fasting for at least 8 hours.  Glucose, capillary     Status: Abnormal    Collection Time: 11/11/23  4:08 AM  Result Value Ref Range    Glucose-Capillary 49 (L) 70 - 99 mg/dL      Comment: Glucose reference range applies only to samples taken after fasting for at least 8 hours.  Glucose, capillary     Status: Abnormal    Collection Time: 11/11/23  4:44 AM  Result Value Ref  Range    Glucose-Capillary 201 (H) 70 - 99 mg/dL      Comment: Glucose reference range applies only to samples taken after fasting for at least 8 hours.  Magnesium      Status: None    Collection Time: 11/11/23  5:13 AM  Result Value Ref Range    Magnesium  2.2 1.7 - 2.4 mg/dL      Comment: Performed at [facility], [address], [place], [place] [address]  CBC with Differential/Platelet     Status: Abnormal    Collection Time: 11/11/23  5:13 AM  Result Value Ref Range    WBC 6.9 4.0 - 10.5 K/uL    RBC 3.50 (L) 3.87 - 5.11 MIL/uL    Hemoglobin 11.2 (L) 12.0 - 15.0 g/dL    HCT 91.4 (L) 78.2 - 46.0 %    MCV 101.1 (H) 80.0 - 100.0 fL    MCH 32.0 26.0 - 34.0 pg    MCHC 31.6 30.0 - 36.0 g/dL    RDW 95.6 21.3 - 08.6 %    Platelets 292 150 - 400 K/uL    nRBC 0.0 0.0 - 0.2 %    Neutrophils Relative % 62 %    Neutro Abs 4.3 1.7 - 7.7 K/uL    Lymphocytes Relative 22 %    Lymphs Abs 1.5 0.7 - 4.0 K/uL    Monocytes Relative 10 %    Monocytes Absolute 0.7 0.1 - 1.0 K/uL    Eosinophils Relative 5 %    Eosinophils Absolute 0.3 0.0 - 0.5 K/uL    Basophils Relative 0 %    Basophils Absolute 0.0 0.0 - 0.1 K/uL    Immature Granulocytes 1 %    Abs Immature Granulocytes 0.05 0.00 - 0.07 K/uL      Comment: Performed at [facility], [address], [place], [place] [address]  Brain natriuretic peptide     Status: None    Collection Time: 11/11/23  5:13 AM  Result Value Ref Range    B Natriuretic Peptide 62.0 0.0 - 100.0 pg/mL      Comment: Performed at [facility], [address] [place], [place] [address]  Phosphorus     Status: None    Collection Time: 11/11/23  5:13 AM  Result Value Ref Range    Phosphorus 4.0 2.5 - 4.6 mg/dL      Comment: Performed at [facility], [address] [place], [place] [address]  Comprehensive metabolic panel with GFR     Status: Abnormal    Collection Time: 11/11/23  5:13 AM  Result Value Ref Range    Sodium 138 135 - 145 mmol/L     Potassium 3.1 (L) 3.5 - 5.1 mmol/L    Chloride 106 98 - 111 mmol/L    CO2 21 (L) 22 - 32 mmol/L    Glucose, Bld 165 (H) 70 - 99 mg/dL      Comment: Glucose reference range applies only to samples taken after fasting for at least 8 hours.    BUN 14 8 - 23 mg/dL    Creatinine, Ser 6.21 0.44 - 1.00 mg/dL    Calcium  8.6 (L) 8.9 - 10.3 mg/dL    Total Protein 7.3 6.5 - 8.1 g/dL    Albumin 3.0 (L) 3.5 - 5.0 g/dL    AST 55 (H) 15 - 41 U/L    ALT 49 (H) 0 - 44 U/L    Alkaline Phosphatase 238 (H) 38 - 126 U/L    Total Bilirubin 0.6 0.0 - 1.2 mg/dL    GFR, Estimated >30 >86 mL/min      Comment: (NOTE) Calculated using the CKD-EPI Creatinine Equation (2021)      Anion gap 11 5 - 15      Comment: Performed at [facility], [address], [place], [place] [address]  Glucose, capillary     Status: Abnormal    Collection Time: 11/11/23  8:19 AM  Result Value Ref Range    Glucose-Capillary 310 (H) 70 - 99 mg/dL      Comment: Glucose reference range applies only to samples taken after fasting for at least 8 hours.       Imaging Results (Last 48 hours)  DG Swallowing Func-Speech Pathology Result Date: 11/10/2023 Table formatting from the original result was not included. Modified Barium Swallow Study Patient Details Name: ARYN SAFRAN MRN: [id number] Date of Birth: [birth year] Today's Date: 11/10/2023 HPI/PMH: HPI: Pt is a 73 y.o. female presenting after fall 5/14. CT with occipital skull fracture, traumatic subarachnoid hemorrhage and right subdural hematoma. Repeat CT increased size of R subdural hematoma. Intubated 5/15-5/23 secondary to respiratory failure secondary to E. coli, Streptococcus HAP. EEG 5/16 with cortical dysfunction arising from R hemisphere, moderate-severe diffuse encephalopathy. EEG 5/17 with seizure activity averaging 4 seizures per hour lasting 1 min each; resolved with adjustment to medications. RLE DVT 5/19 and IVC filter placed. PMH significant for autoimmune hepatitis  with cirrhosis of liver, HTN, depression, GERD, HLD, DM, IBS, osteoporosis, thyroid  nodule, vascular disease, vitamin D deficiency. Clinical Impression: Clinical Impression: Pt demonstrated a DIGEST score of 2 indicating moderate oropharyngeal dysphagia. Pt had excessive movements, decreased sustained attention and needed cueing throughout study. There was decreased labial closure with anterior spill, trace lingual residue and slow mastication of solid in part due to decreased attention. There was incomplete glottal closure, partial anterior hyoid excursion and partial epiglottic inversion resulting in penetration with thin  to the cords that pt sensed and cleared throat to clear penetrates. Chin tuck strategy was ineffective. Nectar thick penetrated to cords with minimal remaining in vestibule. Laryngeal penetration with honey thick to vocal cords that pt sensed and spontaneous throat clear cleared vestibule. Pharyngeal residue was mild in valleculae and pyriform sinuses. Recommend puree texture, honey thick liquids, intermittent throat clear, crush pills and full assist/supervision with po's. DIGEST Swallow Severity Rating*  Safety:  Efficiency:  Overall Pharyngeal Swallow Severity: 1: mild; 2: moderate; 3: severe; 4: profound *The Dynamic Imaging Grade of Swallowing Toxicity is standardized for the head and neck cancer population, however, demonstrates promising clinical applications across populations to standardize the clinical rating of pharyngeal swallow safety and severity. Factors that may increase risk of adverse event in presence of aspiration Roderick Civatte & Jessy Morocco 2021): Factors that may increase risk of adverse event in presence of aspiration Roderick Civatte & Jessy Morocco 2021): Reduced cognitive function Recommendations/Plan: Swallowing Evaluation Recommendations Swallowing Evaluation Recommendations Recommendations: PO diet PO Diet Recommendation: Moderately thick liquids (Level 3, honey thick); Dysphagia 1 (Pureed)  Liquid Administration via: Cup Medication Administration: Crushed with puree Supervision: Staff to assist with self-feeding; Full supervision/cueing for swallowing strategies Swallowing strategies  : Slow rate; Small bites/sips; Clear throat intermittently Postural changes: Position pt fully upright for meals Oral care recommendations: Oral care BID (2x/day) Treatment Plan Treatment Plan Treatment recommendations: Therapy as outlined in treatment plan below Follow-up recommendations: Acute inpatient rehab (3 hours/day) Functional status assessment: Patient has had a recent decline in their functional status and demonstrates the ability to make significant improvements in function in a reasonable and predictable amount of time. Treatment frequency: Min 2x/week Treatment duration: 2 weeks Interventions: Compensatory techniques; Patient/family education; Trials of upgraded texture/liquids; Diet toleration management by SLP Recommendations Recommendations for follow up therapy are one component of a multi-disciplinary discharge planning process, led by the attending physician.  Recommendations may be updated based on patient status, additional functional criteria and insurance authorization. Assessment: Orofacial Exam: Orofacial Exam Oral Cavity - Dentition: Adequate natural dentition Orofacial Anatomy: WFL Oral Motor/Sensory Function: WFL Anatomy: Anatomy: WFL Boluses Administered: Boluses Administered Boluses Administered: Thin liquids (Level 0); Mildly thick liquids (Level 2, nectar thick); Moderately thick liquids (Level 3, honey thick); Puree; Solid  Oral Impairment Domain: Oral Impairment Domain Lip Closure: Escape progressing to mid-chin Tongue control during bolus hold: Cohesive bolus between tongue to palatal seal Bolus preparation/mastication: Slow prolonged chewing/mashing with complete recollection Bolus transport/lingual motion: Brisk tongue motion Oral residue: Trace residue lining oral structures Location  of oral residue : Tongue Initiation of pharyngeal swallow : Pyriform sinuses  Pharyngeal Impairment Domain: Pharyngeal Impairment Domain Soft palate elevation: No bolus between soft palate (SP)/pharyngeal wall (PW) Laryngeal elevation: Complete superior movement of thyroid  cartilage with complete approximation of arytenoids to epiglottic petiole Anterior hyoid excursion: Partial anterior movement Epiglottic movement: Partial inversion Laryngeal vestibule closure: Incomplete, narrow column air/contrast in laryngeal vestibule Pharyngeal stripping wave : Present - complete Pharyngeal contraction (A/P view only): N/A Pharyngoesophageal segment opening: Complete distension and complete duration, no obstruction of flow Tongue base retraction: No contrast between tongue base and posterior pharyngeal wall (PPW) Pharyngeal residue: Collection of residue within or on pharyngeal structures Location of pharyngeal residue: Valleculae; Pyriform sinuses  Esophageal Impairment Domain: Esophageal Impairment Domain Esophageal clearance upright position: Esophageal retention Pill: No data recorded Penetration/Aspiration Scale Score: Penetration/Aspiration Scale Score 1.  Material does not enter airway: Solid; Puree 2.  Material enters airway, remains ABOVE vocal cords then ejected out: Moderately thick  liquids (Level 3, honey thick) 3.  Material enters airway, remains ABOVE vocal cords and not ejected out: Moderately thick liquids (Level 3, honey thick) 4.  Material enters airway, CONTACTS cords then ejected out: Moderately thick liquids (Level 3, honey thick) Compensatory Strategies: Compensatory Strategies Compensatory strategies: Yes Chin tuck: Ineffective Ineffective Chin Tuck: Thin liquid (Level 0)   General Information: Caregiver present: No  Diet Prior to this Study: NPO; Cortrak/Small bore NG tube   Temperature : Normal   Respiratory Status: WFL   Supplemental O2: None (Room air)   History of Recent Intubation: Yes   Behavior/Cognition: Alert; Cooperative; Confused; Distractible; Requires cueing Self-Feeding Abilities: Needs assist with self-feeding Baseline vocal quality/speech: Normal No data recorded No data recorded Exam Limitations: Excessive movement Goal Planning: Prognosis for improved oropharyngeal function: Good Barriers to Reach Goals: Cognitive deficits No data recorded No data recorded Consulted and agree with results and recommendations: Patient; Nurse Pain: Pain Assessment Pain Assessment: Faces Faces Pain Scale: 0 Breathing: 0 Negative Vocalization: 0 Facial Expression: 0 Body Language: 0 Consolability: 0 PAINAD Score: 0 Facial Expression: 0 Body Movements: 0 Muscle Tension: 0 Compliance with ventilator (intubated pts.): N/A Vocalization (extubated pts.): 0 CPOT Total: 0 Pain Location: generalized, back, sacral sores Pain Descriptors / Indicators: Discomfort; Grimacing Pain Intervention(s): Monitored during session End of Session: Start Time:SLP Start Time (ACUTE ONLY): 1418 Stop Time: SLP Stop Time (ACUTE ONLY): 1440 Time Calculation:SLP Time Calculation (min) (ACUTE ONLY): 22 min Charges: SLP Evaluations $ SLP Speech Visit: 1 Visit SLP Evaluations $MBS Swallow: 1 Procedure $Swallowing Treatment: 1 Procedure SLP visit diagnosis: SLP Visit Diagnosis: Dysphagia, oropharyngeal phase (R13.12) Past Medical History: Past Medical History: Diagnosis Date  Abnormal Pap smear of cervix   05-15-21 ascus hpv hr+  Allergy   Anxiety   on meds  Autoimmune hepatitis (HCC) 08/18/2016  08/2016 liver bx confirms, fibrosis not cirrhosis  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 08/29/2020  COVID 06/27/2020  Depression   on meds  DM (diabetes mellitus) (HCC)   Fracture, fibula   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  GERD (gastroesophageal reflux disease)   on meds  Hiatal hernia   Hx of adenomatous polyp of colon 07/07/2009  Hyperlipidemia   diet controlled  Hypertension   on  meds - LASIX   IBS (irritable bowel syndrome)   Iron  deficiency anemia   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 Willy Harvest, adenimatous polyps, repeat in 5 yrs  ESOPHAGOGASTRODUODENOSCOPY  07/06/2021  per Dr. Willy Harvest, showed portal hypertensive gastropathy, repeat in 2 yrs  ESOPHAGOGASTRODUODENOSCOPY (EGD) WITH PROPOFOL  N/A 06/20/2017  Procedure: ESOPHAGOGASTRODUODENOSCOPY (EGD) WITH PROPOFOL ;  Surgeon: Kenney Peacemaker, MD;  Location: [facility];  Service: Endoscopy;  Laterality: N/A;  with Barrx  EYE SURGERY    IR IVC FILTER PLMT / S&I /IMG GUID/MOD SED  10/31/2023  NASAL SEPTOPLASTY W/ TURBINOPLASTY    POLYPECTOMY  2015  CG-TA  TONSILLECTOMY   [name] 11/10/2023, 3:49 PM          Blood pressure (!) 139/102, pulse 95, temperature 98.3 F (36.8 C), temperature source Oral, resp.  rate (!) 31, height 5\' 4"  (1.626 m), weight 54.2 kg, SpO2 95%.   Medical Problem List and Plan: 1. Functional deficits secondary to TBI             -patient may  shower             -ELOS/Goals: 14-18 days, Min A PT./OT./SLP   - Stable to admit to inpatient rehab  2.  Antithrombotics: On hold due to [facility]   -DVT/anticoagulation:  Mechanical:  Antiembolism stockings, knee (TED hose) Bilateral lower extremities Sequential compression devices, below knee Bilateral lower extremities Pharmaceutical: Other (comment)             -antiplatelet therapy: ASA 81 mg daily   3. Pain Management: Tramadol and Tylenol  PRN  4. Mood/Behavior/Sleep: LCSW to follow for evaluation and support. -Trazodone  PRN             -antipsychotic agents: N/A    5.  Neuropsych/cognition: This patient is not capable of making decisions on her own behalf.  - Bed alarm  6. Skin/Wound Care: Routine pressure relief measures.             - Continue topical cream PRN   7. Fluids/Electrolytes/Nutrition: Monitor I/O's. CMET in a.m.  - 5/30 MBS recc Moderately thick liquids; Dysphagia 1--did not yet resume p.o. diet, will start on admission             - Continue tube feeds for now Osmolite, Prosource--will add p.o. diet in a.m., will discuss with dietary adjusting tube feeds to promote hunger - Tube maintenance, nasotracheal suction, oral care   8.  DM type II:  5/15 A1c= 6.8              - Continue Semglee  dose 15 units (decreased r/t hypoglycemic event on 5/30) -NovoLog  per SSI -Monitor blood sugars QID Recent Labs    11/11/23 1604 11/11/23 1645 11/11/23 2116  GLUCAP 254* 238* 201*     9.  Hyperlipidemia: Continue Zetia   10. BP/Heart rate-  -continue amiodarone 100 mg daily             - continue Lopressor 25 mg daily             - Cards follow-up- Zio patch at discharge    11/11/2023    7:43 PM 11/11/2023    4:33 PM 11/11/2023    8:38 AM  Vitals with BMI  Height  5\' 4"    Weight  112 lbs 3 oz   BMI  19.25   Systolic 113 114   Diastolic 90 85   Pulse 96 92 95    11.  Seizures: Continue Keppra  1000 mg twice daily.  12.  Autoimmune hepatitis/cirrhosis: CBC in a.m. -Continue prednisone  10 mg              - Monitor LFT (alk phos trending up)  13.  GERD: Continue Protonix   14. HTN: Hydralazine  10 mg PRN             -Strict I/O, daily weights, monitor for signs of fluid overload             -monitor ortho stat vitals   15.  Hx of EtOH abuse: Negative for DTs.              - Continue thiamine  and folic acid   16.  Acute hypoxic resp failure due to HCAPs-sputum culture 5/17 + E. coli and strep pneumonia             -  S/P Evan days ABX last dose 5/23             - Guaifenesin  PRN   Brandi L Leak, NP 11/11/2023  I have examined the patient  independently and edited the note for HPI, ROS, exam, assessment, and plan as appropriate. I am in agreement with the above recommendations.   Bea Lime, DO 11/11/2023

## 2023-11-11 NOTE — H&P (Signed)
 Physical Medicine and Rehabilitation Admission H&P    Chief Complaint  Patient presents with   Functional debility related to TBI    HPI: Miranda Cochran is a 72 year old female PMHx of HTN, DM type II, GAVE syndrome causing GI bleed, alcohol abuse, autoimmune hepatitis with cirrhosis of liver, chronically on prednisone , GERD, hiatal hernia, and PAD presented after falling and striking her head.  Patient was admitted on 10/26/2023  for TBI. ER labs showed hypokalemia with prolonged QTc on EKG, WBC 16.5, SBP > 200. CT significant for evidence of occipital skull fracture and traumatic subarachnoid hemorrhage and right subdural hematoma.  On 5/15 patient developed AMS and tachyarrhythmia.  Dr. Bonnita Buttner consulted, CT showed TBI with traumatic increasing right subdural hematoma, SAH and right temporal contusion. MRI showed small acute infarcts in bilateral cerebral hemispheres right more than left.  PCCM consulted Dr. Ardelle Kos intubated for worsening TBI, EEG revealed seizure activity, patient received Keppra /phenobarbital .  Serial head CT's have been stable and show improvement.    Hospital course significant for RLE DVT, IVC filter was placed by Dr. Nereida Banning she was not a candidate for anticoagulation given falls/TBI. Alk phos trended upwards requiring close monitoring of LFT's. CTA chest negative for PE but showed LLL PNA.  Dr. Stann Earnest consulted for runs of SVT that later terminated with adenosine .  Lopressor  and amiodarone  initiated via tube.  TTE showed normal EF and moderate AS.  Coretrak placed 5/21 due to expressive aphasia and dysphagia SLP following.  She continues to be limited by left-sided weakness, expressive aphasia and dysphagia. She is mod assist and was independent PTA.  CIR recommended due to functional decline.  Review of Systems  Constitutional: Negative.   HENT: Negative.    Eyes: Negative.   Respiratory: Negative.    Cardiovascular: Negative.  Negative for chest pain, palpitations  and leg swelling.  Gastrointestinal: Negative.   Genitourinary: Negative.   Musculoskeletal:  Positive for falls.  Skin: Negative.   Neurological:  Positive for weakness.  Endo/Heme/Allergies:  Positive for environmental allergies.  Psychiatric/Behavioral: Negative.     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   GERD (gastroesophageal reflux disease)    on meds   Hiatal hernia    Hx of adenomatous polyp of colon 07/07/2009   Hyperlipidemia    diet controlled   Hypertension    on meds - LASIX    IBS (irritable bowel syndrome)    Iron  deficiency anemia    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 Willy Harvest, adenimatous polyps, repeat in  5 yrs   ESOPHAGOGASTRODUODENOSCOPY  07/06/2021   per Dr. Willy Harvest, showed portal hypertensive gastropathy, repeat in 2 yrs   ESOPHAGOGASTRODUODENOSCOPY (EGD) WITH PROPOFOL  N/A 06/20/2017   Procedure: ESOPHAGOGASTRODUODENOSCOPY (EGD) WITH PROPOFOL ;  Surgeon: Kenney Peacemaker, MD;  Location: [facility];  Service: Endoscopy;  Laterality: N/A;  with Barrx   EYE SURGERY     IR IVC FILTER PLMT / S&I /IMG GUID/MOD SED  10/31/2023   NASAL SEPTOPLASTY W/ TURBINOPLASTY      POLYPECTOMY  2015   CG-TA   TONSILLECTOMY     Family History  Problem Relation Age of Onset   Stroke Mother    Hypertension Mother    Colon cancer Father 6   Liver cancer Father 5   Colon polyps Father 32   Heart disease Sister        hole in heart   Pancreatic cancer Maternal Aunt    Brain cancer Maternal Uncle    Stroke Maternal Grandmother    Hypertension Maternal Grandmother    Esophageal cancer Maternal Grandfather    Throat cancer Maternal Grandfather    Breast cancer Paternal Grandmother    Brain cancer Paternal Grandmother    Lung cancer Paternal Grandfather    Rectal cancer Neg Hx    Stomach cancer Neg Hx    Social History:  reports that she has never smoked. She has never used smokeless tobacco. She reports current alcohol use. She reports that she does not use drugs.   Allergies  Allergen Reactions   Fentanyl  Anxiety    Made her feel very anxious, requests not to get it.   Levofloxacin Anxiety    Weird dreams    Amoxicillin  Diarrhea    Has patient had a PCN reaction causing immediate rash, facial/tongue/throat swelling, SOB or lightheadedness with hypotension: No Has patient had a PCN reaction causing severe rash involving mucus membranes or skin necrosis: No Has patient had a PCN reaction that required hospitalization: No Has patient had a PCN reaction occurring within the last 10 years: Yes If all of the above answers are "NO", then may proceed with Cephalosporin use.    Augmentin  [Amoxicillin -Pot Clavulanate] Other (See Comments)    diarrhea   Farxiga  [Dapagliflozin ] Diarrhea    Gi pain   Statins     Myalgia    Medications Prior to Admission  Medication Sig Dispense Refill   ALPRAZolam  (XANAX ) 0.5 MG tablet TAKE 1 TABLET(0.5 MG) BY MOUTH AT BEDTIME AS NEEDED FOR SLEEP 90 tablet 1   AMBULATORY NON FORMULARY MEDICATION ACTIVE LIVER Artichoke leaf extract, Milk thistle, Turmeric Take 1 capsule by mouth once daily     aspirin  325 MG tablet Take 650 mg  by mouth daily as needed for moderate pain (pain score 4-6) or headache.     cetirizine  (ZYRTEC ) 10 MG tablet Take 10 mg by mouth daily.     Cyanocobalamin  500 MCG CHEW Chew 500 mcg by mouth daily.     diphenhydrAMINE  (BENADRYL ) 25 mg capsule Take 25-50 mg by mouth daily as needed for allergies.      furosemide  (LASIX ) 40 MG tablet TAKE 1 TABLET(40 MG) BY MOUTH DAILY 90 tablet 3   ibuprofen (ADVIL) 200 MG tablet Take 400 mg by mouth as needed for mild pain (pain score 1-3) or moderate pain (pain score 4-6).     insulin  lispro (HUMALOG ) 100 UNIT/ML KwikPen 18-22 Units daily with lunch.     lansoprazole  (PREVACID ) 30 MG capsule TAKE 1 CAPSULE(30 MG) BY MOUTH DAILY 90  capsule 3   lisinopril  (ZESTRIL ) 10 MG tablet Take 1 tablet (10 mg total) by mouth daily. 90 tablet 0   methocarbamol (ROBAXIN) 500 MG tablet Take 500 mg by mouth every 6 (six) hours as needed.     Multiple Vitamin (MULTIVITAMIN PO) Take by mouth.     naloxone (NARCAN) nasal spray 4 mg/0.1 mL Spray the contents of 1 device into 1 nostril. Call 911. May repeat with 2nd device in alternate nostril if no response in 2-3 minutes.     predniSONE  (DELTASONE ) 10 MG tablet Take 10 mg by mouth daily with breakfast.     RYBELSUS 14 MG TABS Take 1 tablet by mouth every morning.     traZODone  (DESYREL ) 50 MG tablet TAKE 1 TABLET BY MOUTH EVERYDAY AT BEDTIME (Patient taking differently: Take 50 mg by mouth at bedtime as needed for sleep.) 90 tablet 1   tretinoin (RETIN-A) 0.025 % cream Apply topically at bedtime.     venlafaxine  XR (EFFEXOR -XR) 75 MG 24 hr capsule TAKE 1 CAPSULE BY MOUTH TWICE DAILY WITH A MEAL (Patient taking differently: Take 75 mg by mouth. TAKE 1 CAPSULE BY MOUTH DAILY WITH A MEAL) 180 capsule 3   [DISCONTINUED] Cholecalciferol  25 MCG (1000 UT) CHEW Chew 1,000 Units by mouth daily.     [DISCONTINUED] ezetimibe  (ZETIA ) 10 MG tablet TAKE 1 TABLET(10 MG) BY MOUTH DAILY 30 tablet 3      Home: Home Living Family/patient expects  to be discharged to:: Private residence Living Arrangements: Spouse/significant other Available Help at Discharge: Family Type of Home: Apartment Home Access: Level entry Home Layout: One level Bathroom Shower/Tub: Engineer, manufacturing systems: Standard Home Equipment: Medical laboratory scientific officer - quad   Functional History: Prior Function Prior Level of Function : History of Falls (last six months), Independent/Modified Independent, Working/employed, Driving Mobility Comments: independent, active ADLs Comments: works in [employer], independent  Functional Status:  Mobility: Bed Mobility Overal bed mobility: Needs Assistance Bed Mobility: Rolling, Sidelying to Sit, Sit to Sidelying Rolling: Used rails, Contact guard assist Sidelying to sit: Mod assist Supine to sit: Max assist, HOB elevated, Used rails Sit to supine: Max assist, HOB elevated, Used rails Sit to sidelying: Min assist General bed mobility comments: CGA to roll Lt and Rt several times in bed to assist with peri-care due to incontinence of stool. Mod assist for trunk support to rise to EOB, multimodal cues to facilitate and sequence. Min assist for LE support back into bed with cues for technique. Transfers Overall transfer level: Needs assistance Equipment used: 1 person hand held assist Transfers: Sit to/from Stand, Bed to chair/wheelchair/BSC Sit to Stand: Mod assist Bed to/from chair/wheelchair/BSC transfer type:: Step pivot Step pivot transfers: Mod assist Transfer via Lift Equipment: Stedy General transfer comment: Mod assist for boost to stand from bed, followed by step pivot tranfer to [facility] with cues and bil UE support through therapist. Needs multimodal cues to sequence and align with surface prior to sitting. Min assist to stand from [facility] and step pivot transfer towards bed with improved control when directed while turning towards Rt side. Ambulation/Gait General Gait Details: unable at this time Pre-gait activities: Able to stand  with min assist and take lateral steps along bed today with BIL UEs supported by therapist. LEs still with slight buckling but able to correct with min assist.    ADL: ADL Overall ADL's : Needs assistance/impaired Eating/Feeding: NPO Grooming: Oral care, Maximal assistance, Sitting Grooming Details (indicate cue type and reason): needing assist for normalized movement  patterns of RUE for repetitive back and forth motion (able to initiate with increased time and cues but not sustain, for reducing undershooting/overshooting bringing toothbrush to mouth, and asisst for sitting balance. Upper Body Bathing: Maximal assistance, Sitting Lower Body Bathing: Total assistance, Sit to/from stand Upper Body Dressing : Moderate assistance, Bed level, Cueing for sequencing Upper Body Dressing Details (indicate cue type and reason): max due to restlessness/pt pulling at IV Lower Body Dressing: Maximal assistance, Sitting/lateral leans, Cueing for sequencing Lower Body Dressing Details (indicate cue type and reason): donned R sock with max A from bed level; total A for L Toilet Transfer: Total assistance, +2 for physical assistance, +2 for safety/equipment Toilet Transfer Details (indicate cue type and reason): for STS Toileting- Clothing Manipulation and Hygiene: +2 for physical assistance, +2 for safety/equipment, Bed level Toileting - Clothing Manipulation Details (indicate cue type and reason): found to have purewick failure (likely due to pt restless in bed) and incontinent of BM. +2 for rolling and linen change with pericare Functional mobility during ADLs: Total assistance General ADL Comments: bed level/sitting EOB for ADLs, poor sitting balance, restless, cueing for safety/sequencing  Cognition: Cognition Orientation Level: Oriented to person, Oriented to place Cognition Arousal: Alert Behavior During Therapy: Restless  Physical Exam: Blood pressure (!) 139/102, pulse 95, temperature 98.3 F  (36.8 C), temperature source Oral, resp. rate (!) 31, height 5\' 4"  (1.626 m), weight 54.2 kg, SpO2 95%. Physical Exam Vitals and nursing note reviewed.  Constitutional:      General: She is not in acute distress.    Appearance: She is not ill-appearing.  HENT:     Head: Normocephalic.     Nose: No congestion or rhinorrhea.     Comments: Coretrak present    Mouth/Throat:     Mouth: Mucous membranes are dry.     Pharynx: Oropharynx is clear.  Eyes:     Extraocular Movements: Extraocular movements intact.     Conjunctiva/sclera: Conjunctivae normal.     Pupils: Pupils are equal, round, and reactive to light.  Cardiovascular:     Rate and Rhythm: Normal rate and regular rhythm.     Pulses: Normal pulses.          Dorsalis pedis pulses are 2+ on the right side and 2+ on the left side.       Posterior tibial pulses are 2+ on the right side and 2+ on the left side.     Heart sounds: Normal heart sounds and S1 normal. No murmur heard.    No friction rub. No gallop.  Pulmonary:     Effort: Pulmonary effort is normal.     Breath sounds: Normal breath sounds. No decreased air movement. No wheezing or rhonchi.  Abdominal:     General: Bowel sounds are increased. There is no distension.     Palpations: Abdomen is soft.     Tenderness: There is no abdominal tenderness.  Musculoskeletal:        General: No swelling or tenderness. Normal range of motion.     Cervical back: Normal range of motion and neck supple.     Right lower leg: No edema.     Left lower leg: No edema.  Feet:     Right foot:     Skin integrity: No ulcer or blister.     Left foot:     Skin integrity: No ulcer or blister.     Comments: Minor bruising, bandages present on BL heel. Skin:    General:  Skin is warm and dry.     Capillary Refill: Capillary refill takes less than 2 seconds.     Findings: Abrasion, bruising and signs of injury present.     Comments: Multiple bruises and abrasions noted to bilat upper/lower  extremities.  Healing occipital scalp laceration.  Neurological:     Mental Status: She is alert and oriented to person, place, and time.     Cranial Nerves: Cranial nerves 2-12 are intact.     Sensory: Sensation is intact. No sensory deficit.     Motor: Weakness present. No tremor, atrophy, seizure activity or pronator drift.     Coordination: Finger-Nose-Finger Test abnormal. Heel to Shin Test normal.  Psychiatric:        Mood and Affect: Mood normal.        Behavior: Behavior normal.        Thought Content: Thought content normal.        Judgment: Judgment normal.    Results for orders placed or performed during the hospital encounter of 10/26/23 (from the past 48 hours)  Glucose, capillary     Status: Abnormal   Collection Time: 11/09/23  1:01 PM  Result Value Ref Range   Glucose-Capillary 230 (H) 70 - 99 mg/dL    Comment: Glucose reference range applies only to samples taken after fasting for at least 8 hours.  Glucose, capillary     Status: Abnormal   Collection Time: 11/09/23  4:34 PM  Result Value Ref Range   Glucose-Capillary 152 (H) 70 - 99 mg/dL    Comment: Glucose reference range applies only to samples taken after fasting for at least 8 hours.  Glucose, capillary     Status: Abnormal   Collection Time: 11/09/23  7:26 PM  Result Value Ref Range   Glucose-Capillary 141 (H) 70 - 99 mg/dL    Comment: Glucose reference range applies only to samples taken after fasting for at least 8 hours.  Glucose, capillary     Status: None   Collection Time: 11/10/23  1:05 AM  Result Value Ref Range   Glucose-Capillary 97 70 - 99 mg/dL    Comment: Glucose reference range applies only to samples taken after fasting for at least 8 hours.  Glucose, capillary     Status: Abnormal   Collection Time: 11/10/23  4:27 AM  Result Value Ref Range   Glucose-Capillary 235 (H) 70 - 99 mg/dL    Comment: Glucose reference range applies only to samples taken after fasting for at least 8 hours.   Magnesium      Status: None   Collection Time: 11/10/23  5:56 AM  Result Value Ref Range   Magnesium  2.0 1.7 - 2.4 mg/dL    Comment: Performed at [facility], [address], [place], [place] [address]  CBC with Differential/Platelet     Status: Abnormal   Collection Time: 11/10/23  5:56 AM  Result Value Ref Range   WBC 6.6 4.0 - 10.5 K/uL   RBC 3.30 (L) 3.87 - 5.11 MIL/uL   Hemoglobin 10.6 (L) 12.0 - 15.0 g/dL   HCT 46.9 (L) 62.9 - 52.8 %   MCV 99.4 80.0 - 100.0 fL   MCH 32.1 26.0 - 34.0 pg   MCHC 32.3 30.0 - 36.0 g/dL   RDW 41.3 24.4 - 01.0 %   Platelets 267 150 - 400 K/uL    Comment: REPEATED TO VERIFY   nRBC 0.0 0.0 - 0.2 %   Neutrophils Relative % 60 %  Neutro Abs 4.0 1.7 - 7.7 K/uL   Lymphocytes Relative 23 %   Lymphs Abs 1.5 0.7 - 4.0 K/uL   Monocytes Relative 11 %   Monocytes Absolute 0.7 0.1 - 1.0 K/uL   Eosinophils Relative 4 %   Eosinophils Absolute 0.3 0.0 - 0.5 K/uL   Basophils Relative 1 %   Basophils Absolute 0.0 0.0 - 0.1 K/uL   Immature Granulocytes 1 %   Abs Immature Granulocytes 0.06 0.00 - 0.07 K/uL    Comment: Performed at [facility], [address] [place], [place] [address]  Brain natriuretic peptide     Status: None   Collection Time: 11/10/23  5:56 AM  Result Value Ref Range   B Natriuretic Peptide 84.6 0.0 - 100.0 pg/mL    Comment: Performed at [facility], [address] [place], [place] [address]  Phosphorus     Status: None   Collection Time: 11/10/23  5:56 AM  Result Value Ref Range   Phosphorus 3.3 2.5 - 4.6 mg/dL    Comment: Performed at [facility], [address] [place], [place] [address]  Comprehensive metabolic panel with GFR     Status: Abnormal   Collection Time: 11/10/23  5:56 AM  Result Value Ref Range   Sodium 134 (L) 135 - 145 mmol/L   Potassium 4.1 3.5 - 5.1 mmol/L   Chloride 106 98 - 111 mmol/L   CO2 20 (L) 22 - 32 mmol/L   Glucose, Bld 308 (H) 70 - 99 mg/dL    Comment: Glucose reference  range applies only to samples taken after fasting for at least 8 hours.   BUN 15 8 - 23 mg/dL   Creatinine, Ser 6.96 0.44 - 1.00 mg/dL   Calcium  8.3 (L) 8.9 - 10.3 mg/dL   Total Protein 6.9 6.5 - 8.1 g/dL   Albumin 2.7 (L) 3.5 - 5.0 g/dL   AST 57 (H) 15 - 41 U/L   ALT 54 (H) 0 - 44 U/L   Alkaline Phosphatase 248 (H) 38 - 126 U/L   Total Bilirubin 0.8 0.0 - 1.2 mg/dL   GFR, Estimated >29 >52 mL/min    Comment: (NOTE) Calculated using the CKD-EPI Creatinine Equation (2021)    Anion gap 8 5 - 15    Comment: Performed at [facility], [address], [place], [place] [address]  Glucose, capillary     Status: Abnormal   Collection Time: 11/10/23  8:24 AM  Result Value Ref Range   Glucose-Capillary 226 (H) 70 - 99 mg/dL    Comment: Glucose reference range applies only to samples taken after fasting for at least 8 hours.  Glucose, capillary     Status: Abnormal   Collection Time: 11/10/23 11:37 AM  Result Value Ref Range   Glucose-Capillary 188 (H) 70 - 99 mg/dL    Comment: Glucose reference range applies only to samples taken after fasting for at least 8 hours.  Glucose, capillary     Status: Abnormal   Collection Time: 11/10/23  3:46 PM  Result Value Ref Range   Glucose-Capillary 203 (H) 70 - 99 mg/dL    Comment: Glucose reference range applies only to samples taken after fasting for at least 8 hours.  Glucose, capillary     Status: Abnormal   Collection Time: 11/10/23  7:54 PM  Result Value Ref Range   Glucose-Capillary 251 (H) 70 - 99 mg/dL    Comment: Glucose reference range applies only  to samples taken after fasting for at least 8 hours.  Glucose, capillary     Status: Abnormal   Collection Time: 11/11/23 12:06 AM  Result Value Ref Range   Glucose-Capillary 124 (H) 70 - 99 mg/dL    Comment: Glucose reference range applies only to samples taken after fasting for at least 8 hours.  Glucose, capillary     Status: Abnormal   Collection Time: 11/11/23  2:53 AM  Result  Value Ref Range   Glucose-Capillary 65 (L) 70 - 99 mg/dL    Comment: Glucose reference range applies only to samples taken after fasting for at least 8 hours.  Glucose, capillary     Status: Abnormal   Collection Time: 11/11/23  4:08 AM  Result Value Ref Range   Glucose-Capillary 49 (L) 70 - 99 mg/dL    Comment: Glucose reference range applies only to samples taken after fasting for at least 8 hours.  Glucose, capillary     Status: Abnormal   Collection Time: 11/11/23  4:44 AM  Result Value Ref Range   Glucose-Capillary 201 (H) 70 - 99 mg/dL    Comment: Glucose reference range applies only to samples taken after fasting for at least 8 hours.  Magnesium      Status: None   Collection Time: 11/11/23  5:13 AM  Result Value Ref Range   Magnesium  2.2 1.7 - 2.4 mg/dL    Comment: Performed at [facility], [address], [place], [place] [address]  CBC with Differential/Platelet     Status: Abnormal   Collection Time: 11/11/23  5:13 AM  Result Value Ref Range   WBC 6.9 4.0 - 10.5 K/uL   RBC 3.50 (L) 3.87 - 5.11 MIL/uL   Hemoglobin 11.2 (L) 12.0 - 15.0 g/dL   HCT 60.4 (L) 54.0 - 98.1 %   MCV 101.1 (H) 80.0 - 100.0 fL   MCH 32.0 26.0 - 34.0 pg   MCHC 31.6 30.0 - 36.0 g/dL   RDW 19.1 47.8 - 29.5 %   Platelets 292 150 - 400 K/uL   nRBC 0.0 0.0 - 0.2 %   Neutrophils Relative % 62 %   Neutro Abs 4.3 1.7 - 7.7 K/uL   Lymphocytes Relative 22 %   Lymphs Abs 1.5 0.7 - 4.0 K/uL   Monocytes Relative 10 %   Monocytes Absolute 0.7 0.1 - 1.0 K/uL   Eosinophils Relative 5 %   Eosinophils Absolute 0.3 0.0 - 0.5 K/uL   Basophils Relative 0 %   Basophils Absolute 0.0 0.0 - 0.1 K/uL   Immature Granulocytes 1 %   Abs Immature Granulocytes 0.05 0.00 - 0.07 K/uL    Comment: Performed at [facility], [address], [place], [place] [address]  Brain natriuretic peptide     Status: None   Collection Time: 11/11/23  5:13 AM  Result Value Ref Range   B Natriuretic Peptide 62.0 0.0 - 100.0  pg/mL    Comment: Performed at [facility], [address] [place], [place] [address]  Phosphorus     Status: None   Collection Time: 11/11/23  5:13 AM  Result Value Ref Range   Phosphorus 4.0 2.5 - 4.6 mg/dL    Comment: Performed at [facility], [address] [place], [place] [address]  Comprehensive metabolic panel with GFR     Status: Abnormal   Collection Time: 11/11/23  5:13 AM  Result Value Ref Range   Sodium 138 135 -  145 mmol/L   Potassium 3.1 (L) 3.5 - 5.1 mmol/L   Chloride 106 98 - 111 mmol/L   CO2 21 (L) 22 - 32 mmol/L   Glucose, Bld 165 (H) 70 - 99 mg/dL    Comment: Glucose reference range applies only to samples taken after fasting for at least 8 hours.   BUN 14 8 - 23 mg/dL   Creatinine, Ser 1.61 0.44 - 1.00 mg/dL   Calcium  8.6 (L) 8.9 - 10.3 mg/dL   Total Protein 7.3 6.5 - 8.1 g/dL   Albumin 3.0 (L) 3.5 - 5.0 g/dL   AST 55 (H) 15 - 41 U/L   ALT 49 (H) 0 - 44 U/L   Alkaline Phosphatase 238 (H) 38 - 126 U/L   Total Bilirubin 0.6 0.0 - 1.2 mg/dL   GFR, Estimated >09 >60 mL/min    Comment: (NOTE) Calculated using the CKD-EPI Creatinine Equation (2021)    Anion gap 11 5 - 15    Comment: Performed at [facility], [address], [place], [place] [address]  Glucose, capillary     Status: Abnormal   Collection Time: 11/11/23  8:19 AM  Result Value Ref Range   Glucose-Capillary 310 (H) 70 - 99 mg/dL    Comment: Glucose reference range applies only to samples taken after fasting for at least 8 hours.   DG Swallowing Func-Speech Pathology Result Date: 11/10/2023 Table formatting from the original result was not included. Modified Barium Swallow Study Patient Details Name: Miranda Cochran MRN: [id number] Date of Birth: [birth year] Today's Date: 11/10/2023 HPI/PMH: HPI: Pt is a 73 y.o. female presenting after fall 5/14. CT with occipital skull fracture, traumatic subarachnoid hemorrhage and right subdural hematoma. Repeat CT increased size of R subdural  hematoma. Intubated 5/15-5/23 secondary to respiratory failure secondary to E. coli, Streptococcus HAP. EEG 5/16 with cortical dysfunction arising from R hemisphere, moderate-severe diffuse encephalopathy. EEG 5/17 with seizure activity averaging 4 seizures per hour lasting 1 min each; resolved with adjustment to medications. RLE DVT 5/19 and IVC filter placed. PMH significant for autoimmune hepatitis with cirrhosis of liver, HTN, depression, GERD, HLD, DM, IBS, osteoporosis, thyroid  nodule, vascular disease, vitamin D deficiency. Clinical Impression: Clinical Impression: Pt demonstrated a DIGEST score of 2 indicating moderate oropharyngeal dysphagia. Pt had excessive movements, decreased sustained attention and needed cueing throughout study. There was decreased labial closure with anterior spill, trace lingual residue and slow mastication of solid in part due to decreased attention. There was incomplete glottal closure, partial anterior hyoid excursion and partial epiglottic inversion resulting in penetration with thin to the cords that pt sensed and cleared throat to clear penetrates. Chin tuck strategy was ineffective. Nectar thick penetrated to cords with minimal remaining in vestibule. Laryngeal penetration with honey thick to vocal cords that pt sensed and spontaneous throat clear cleared vestibule. Pharyngeal residue was mild in valleculae and pyriform sinuses. Recommend puree texture, honey thick liquids, intermittent throat clear, crush pills and full assist/supervision with po's. DIGEST Swallow Severity Rating*  Safety:  Efficiency:  Overall Pharyngeal Swallow Severity: 1: mild; 2: moderate; 3: severe; 4: profound *The Dynamic Imaging Grade of Swallowing Toxicity is standardized for the head and neck cancer population, however, demonstrates promising clinical applications across populations to standardize the clinical rating of pharyngeal swallow safety and severity. Factors that may increase risk of  adverse event in presence of aspiration Roderick Civatte & Jessy Morocco 2021): Factors that may increase risk of adverse event in presence of aspiration Roderick Civatte & Jessy Morocco 2021):  Reduced cognitive function Recommendations/Plan: Swallowing Evaluation Recommendations Swallowing Evaluation Recommendations Recommendations: PO diet PO Diet Recommendation: Moderately thick liquids (Level 3, honey thick); Dysphagia 1 (Pureed) Liquid Administration via: Cup Medication Administration: Crushed with puree Supervision: Staff to assist with self-feeding; Full supervision/cueing for swallowing strategies Swallowing strategies  : Slow rate; Small bites/sips; Clear throat intermittently Postural changes: Position pt fully upright for meals Oral care recommendations: Oral care BID (2x/day) Treatment Plan Treatment Plan Treatment recommendations: Therapy as outlined in treatment plan below Follow-up recommendations: Acute inpatient rehab (3 hours/day) Functional status assessment: Patient has had a recent decline in their functional status and demonstrates the ability to make significant improvements in function in a reasonable and predictable amount of time. Treatment frequency: Min 2x/week Treatment duration: 2 weeks Interventions: Compensatory techniques; Patient/family education; Trials of upgraded texture/liquids; Diet toleration management by SLP Recommendations Recommendations for follow up therapy are one component of a multi-disciplinary discharge planning process, led by the attending physician.  Recommendations may be updated based on patient status, additional functional criteria and insurance authorization. Assessment: Orofacial Exam: Orofacial Exam Oral Cavity - Dentition: Adequate natural dentition Orofacial Anatomy: WFL Oral Motor/Sensory Function: WFL Anatomy: Anatomy: WFL Boluses Administered: Boluses Administered Boluses Administered: Thin liquids (Level 0); Mildly thick liquids (Level 2, nectar thick); Moderately thick liquids  (Level 3, honey thick); Puree; Solid  Oral Impairment Domain: Oral Impairment Domain Lip Closure: Escape progressing to mid-chin Tongue control during bolus hold: Cohesive bolus between tongue to palatal seal Bolus preparation/mastication: Slow prolonged chewing/mashing with complete recollection Bolus transport/lingual motion: Brisk tongue motion Oral residue: Trace residue lining oral structures Location of oral residue : Tongue Initiation of pharyngeal swallow : Pyriform sinuses  Pharyngeal Impairment Domain: Pharyngeal Impairment Domain Soft palate elevation: No bolus between soft palate (SP)/pharyngeal wall (PW) Laryngeal elevation: Complete superior movement of thyroid  cartilage with complete approximation of arytenoids to epiglottic petiole Anterior hyoid excursion: Partial anterior movement Epiglottic movement: Partial inversion Laryngeal vestibule closure: Incomplete, narrow column air/contrast in laryngeal vestibule Pharyngeal stripping wave : Present - complete Pharyngeal contraction (A/P view only): N/A Pharyngoesophageal segment opening: Complete distension and complete duration, no obstruction of flow Tongue base retraction: No contrast between tongue base and posterior pharyngeal wall (PPW) Pharyngeal residue: Collection of residue within or on pharyngeal structures Location of pharyngeal residue: Valleculae; Pyriform sinuses  Esophageal Impairment Domain: Esophageal Impairment Domain Esophageal clearance upright position: Esophageal retention Pill: No data recorded Penetration/Aspiration Scale Score: Penetration/Aspiration Scale Score 1.  Material does not enter airway: Solid; Puree 2.  Material enters airway, remains ABOVE vocal cords then ejected out: Moderately thick liquids (Level 3, honey thick) 3.  Material enters airway, remains ABOVE vocal cords and not ejected out: Moderately thick liquids (Level 3, honey thick) 4.  Material enters airway, CONTACTS cords then ejected out: Moderately thick  liquids (Level 3, honey thick) Compensatory Strategies: Compensatory Strategies Compensatory strategies: Yes Chin tuck: Ineffective Ineffective Chin Tuck: Thin liquid (Level 0)   General Information: Caregiver present: No  Diet Prior to this Study: NPO; Cortrak/Small bore NG tube   Temperature : Normal   Respiratory Status: WFL   Supplemental O2: None (Room air)   History of Recent Intubation: Yes  Behavior/Cognition: Alert; Cooperative; Confused; Distractible; Requires cueing Self-Feeding Abilities: Needs assist with self-feeding Baseline vocal quality/speech: Normal No data recorded No data recorded Exam Limitations: Excessive movement Goal Planning: Prognosis for improved oropharyngeal function: Good Barriers to Reach Goals: Cognitive deficits No data recorded No data recorded Consulted and agree with results and recommendations:  Patient; Nurse Pain: Pain Assessment Pain Assessment: Faces Faces Pain Scale: 0 Breathing: 0 Negative Vocalization: 0 Facial Expression: 0 Body Language: 0 Consolability: 0 PAINAD Score: 0 Facial Expression: 0 Body Movements: 0 Muscle Tension: 0 Compliance with ventilator (intubated pts.): N/A Vocalization (extubated pts.): 0 CPOT Total: 0 Pain Location: generalized, back, sacral sores Pain Descriptors / Indicators: Discomfort; Grimacing Pain Intervention(s): Monitored during session End of Session: Start Time:SLP Start Time (ACUTE ONLY): 1418 Stop Time: SLP Stop Time (ACUTE ONLY): 1440 Time Calculation:SLP Time Calculation (min) (ACUTE ONLY): 22 min Charges: SLP Evaluations $ SLP Speech Visit: 1 Visit SLP Evaluations $MBS Swallow: 1 Procedure $Swallowing Treatment: 1 Procedure SLP visit diagnosis: SLP Visit Diagnosis: Dysphagia, oropharyngeal phase (R13.12) Past Medical History: Past Medical History: Diagnosis Date  Abnormal Pap smear of cervix   05-15-21 ascus hpv hr+  Allergy   Anxiety   on meds  Autoimmune hepatitis (HCC) 08/18/2016  08/2016 liver bx confirms, fibrosis not cirrhosis   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 08/29/2020  COVID 06/27/2020  Depression   on meds  DM (diabetes mellitus) (HCC)   Fracture, fibula   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  GERD (gastroesophageal reflux disease)   on meds  Hiatal hernia   Hx of adenomatous polyp of colon 07/07/2009  Hyperlipidemia   diet controlled  Hypertension   on meds - LASIX   IBS (irritable bowel syndrome)   Iron  deficiency anemia   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 Willy Harvest, adenimatous polyps, repeat in 5 yrs  ESOPHAGOGASTRODUODENOSCOPY  07/06/2021  per Dr. Willy Harvest, showed portal hypertensive gastropathy, repeat in 2 yrs  ESOPHAGOGASTRODUODENOSCOPY (EGD) WITH PROPOFOL  N/A 06/20/2017  Procedure: ESOPHAGOGASTRODUODENOSCOPY (EGD) WITH PROPOFOL ;  Surgeon: Kenney Peacemaker, MD;  Location: [facility];  Service: Endoscopy;  Laterality: N/A;  with Barrx  EYE SURGERY    IR IVC FILTER PLMT / S&I /IMG GUID/MOD SED  10/31/2023  NASAL SEPTOPLASTY W/ TURBINOPLASTY    POLYPECTOMY  2015  CG-TA  TONSILLECTOMY   [name] 11/10/2023, 3:49 PM     Blood pressure (!) 139/102, pulse 95, temperature 98.3 F (36.8 C), temperature source Oral, resp. rate (!) 31, height 5\' 4"  (1.626 m), weight 54.2 kg, SpO2 95%.  Medical Problem List and Plan: 1. Functional deficits secondary to TBI  -patient may may not shower  -ELOS/Goals: 14-18 days 2.  Antithrombotics: On hold due to [facility]  -DVT/anticoagulation:  Mechanical:  Antiembolism stockings, knee (TED hose) Bilateral lower extremities Sequential compression devices, below  knee Bilateral lower extremities Pharmaceutical: Other (comment)  -antiplatelet therapy: ASA 81 mg daily  3. Pain Management: Tramadol  and Tylenol  PRN 4. Mood/Behavior/Sleep: LCSW to follow for evaluation and support. -Trazodone  PRN  -antipsychotic agents: N/A 5. Neuropsych/cognition: This patient is not capable of making decisions on her own behalf. 6. Skin/Wound Care: Routine pressure relief measures.  - Continue topical cream PRN  7. Fluids/Electrolytes/Nutrition: Monitor I/O's. CMET in a.m.  - 5/30 MBS recc Moderately thick liquids; Dysphagia 1? Resume P.O diet.   - Coretrak-tube feeds??  Osmolite, Prosource - Tube maintenance, nasotracheal suction, oral care  8.  DM type II:  5/15 A1c= 6.8   - Continue Semglee  dose 15 units (decreased r/t hypoglycemic event on 5/30) -NovoLog  per SSI -Monitor blood sugars QID 9.  Hyperlipidemia: Continue Zetia  10. BP/Heart rate-  -continue amiodarone  100 mg daily  - continue Lopressor  25 mg daily  - Cards follow-up- Zio patch at discharge 11.  Seizures: Continue Keppra  1000 mg twice daily. 12.  Autoimmune hepatitis/cirrhosis: CBC in a.m. -Continue prednisone  10 mg   - Monitor LFT (alk phos trending up)  13.  GERD: Continue Protonix  14. HTN: Hydralazine  10 mg PRN  -Strict I/O, daily weights, monitor for signs of fluid overload  -monitor ortho stat vitals  15.  Hx of EtOH abuse: Negative for DTs.   - Continue thiamine  and folic acid  16.  Acute hypoxic resp failure due to HCAPs-sputum culture 5/17 + E. coli and strep pneumonia  - S/P Evan days ABX last dose 5/23  - Guaifenesin  PRN         Baillie Mohammad L Kailyn Vanderslice, NP 11/11/2023

## 2023-11-11 NOTE — Plan of Care (Signed)
 Pt has rested quietly throughout the night with no distress noted. Alert and oriented to self and husband, hospital. On room air. SR on the monitor. Cortrak intact with TF and water  flush. Purewick intact to suction. Pt blood sugar dropped during night and D50 given and it came straight back up. MD notified. Husband at bedside. No complaints voiced.     Problem: Clinical Measurements: Goal: Neurologic status will improve Outcome: Progressing   Problem: Nutritional: Goal: Risk of aspiration will decrease Outcome: Progressing Goal: Dietary intake will improve Outcome: Progressing   Problem: Communication: Goal: Ability to communicate needs accurately will improve Outcome: Progressing   Problem: Ischemic Stroke/TIA Tissue Perfusion: Goal: Complications of ischemic stroke/TIA will be minimized Outcome: Progressing

## 2023-11-11 NOTE — Progress Notes (Signed)
 Report given to nurse on 4W that's receiving the patient.

## 2023-11-11 NOTE — Progress Notes (Signed)
 [name], female   DOB: Dec 05, 1950, 73 y.o.   MRN: [id number]   Pt arrived to 4W25 per bed. Pt unable to answer some admission questions (see admission) Pt oriented to rehab and policies reviewed. Assessment obtained, and vitals obtained  Varney Gentleman, LPN

## 2023-11-12 DIAGNOSIS — S069X9S Unspecified intracranial injury with loss of consciousness of unspecified duration, sequela: Secondary | ICD-10-CM | POA: Diagnosis not present

## 2023-11-12 LAB — CBC WITH DIFFERENTIAL/PLATELET
Abs Immature Granulocytes: 0.06 10*3/uL (ref 0.00–0.07)
Basophils Absolute: 0.1 10*3/uL (ref 0.0–0.1)
Basophils Relative: 1 %
Eosinophils Absolute: 0.4 10*3/uL (ref 0.0–0.5)
Eosinophils Relative: 5 %
HCT: 35.7 % — ABNORMAL LOW (ref 36.0–46.0)
Hemoglobin: 11.1 g/dL — ABNORMAL LOW (ref 12.0–15.0)
Immature Granulocytes: 1 %
Lymphocytes Relative: 28 %
Lymphs Abs: 2.1 10*3/uL (ref 0.7–4.0)
MCH: 31.3 pg (ref 26.0–34.0)
MCHC: 31.1 g/dL (ref 30.0–36.0)
MCV: 100.6 fL — ABNORMAL HIGH (ref 80.0–100.0)
Monocytes Absolute: 1 10*3/uL (ref 0.1–1.0)
Monocytes Relative: 13 %
Neutro Abs: 4 10*3/uL (ref 1.7–7.7)
Neutrophils Relative %: 52 %
Platelets: 370 10*3/uL (ref 150–400)
RBC: 3.55 MIL/uL — ABNORMAL LOW (ref 3.87–5.11)
RDW: 14.8 % (ref 11.5–15.5)
WBC: 7.6 10*3/uL (ref 4.0–10.5)
nRBC: 0 % (ref 0.0–0.2)

## 2023-11-12 LAB — COMPREHENSIVE METABOLIC PANEL WITH GFR
ALT: 45 U/L — ABNORMAL HIGH (ref 0–44)
AST: 52 U/L — ABNORMAL HIGH (ref 15–41)
Albumin: 2.9 g/dL — ABNORMAL LOW (ref 3.5–5.0)
Alkaline Phosphatase: 231 U/L — ABNORMAL HIGH (ref 38–126)
Anion gap: 12 (ref 5–15)
BUN: 15 mg/dL (ref 8–23)
CO2: 19 mmol/L — ABNORMAL LOW (ref 22–32)
Calcium: 8.4 mg/dL — ABNORMAL LOW (ref 8.9–10.3)
Chloride: 106 mmol/L (ref 98–111)
Creatinine, Ser: 0.5 mg/dL (ref 0.44–1.00)
GFR, Estimated: >60 mL/min (ref >60)
Glucose, Bld: 160 mg/dL — ABNORMAL HIGH (ref 70–99)
Potassium: 3.8 mmol/L (ref 3.5–5.1)
Sodium: 137 mmol/L (ref 135–145)
Total Bilirubin: 0.8 mg/dL (ref 0.0–1.2)
Total Protein: 7 g/dL (ref 6.5–8.1)

## 2023-11-12 LAB — GLUCOSE, CAPILLARY
Glucose-Capillary: 162 mg/dL — ABNORMAL HIGH (ref 70–99)
Glucose-Capillary: 164 mg/dL — ABNORMAL HIGH (ref 70–99)
Glucose-Capillary: 208 mg/dL — ABNORMAL HIGH (ref 70–99)
Glucose-Capillary: 231 mg/dL — ABNORMAL HIGH (ref 70–99)
Glucose-Capillary: 232 mg/dL — ABNORMAL HIGH (ref 70–99)
Glucose-Capillary: 282 mg/dL — ABNORMAL HIGH (ref 70–99)
Glucose-Capillary: 305 mg/dL — ABNORMAL HIGH (ref 70–99)

## 2023-11-12 MED ORDER — PROSOURCE TF20 ENFIT COMPATIBL EN LIQD
60.0000 mL | Freq: Two times a day (BID) | ENTERAL | Status: DC
  Administered 2023-11-12 – 2023-11-13 (×2): 60 mL
  Filled 2023-11-12 (×2): qty 60

## 2023-11-12 MED ORDER — HYDROXYZINE HCL 10 MG PO TABS
10.0000 mg | ORAL_TABLET | Freq: Three times a day (TID) | ORAL | Status: DC | PRN
  Administered 2023-11-13: 10 mg via ORAL
  Filled 2023-11-12 (×2): qty 1

## 2023-11-12 MED ORDER — OSMOLITE 1.5 CAL PO LIQD
1000.0000 mL | ORAL | Status: DC
  Administered 2023-11-12: 1000 mL

## 2023-11-12 MED ORDER — ORAL CARE MOUTH RINSE
15.0000 mL | OROMUCOSAL | Status: DC
  Administered 2023-11-12: 15 mL via OROMUCOSAL

## 2023-11-12 MED ORDER — BUSPIRONE HCL 10 MG PO TABS
5.0000 mg | ORAL_TABLET | Freq: Three times a day (TID) | ORAL | Status: DC
  Administered 2023-11-12 – 2023-11-13 (×3): 5 mg via ORAL
  Filled 2023-11-12 (×3): qty 1

## 2023-11-12 MED ORDER — ORAL CARE MOUTH RINSE: 15.0000 mL | OROMUCOSAL | Status: DC | PRN

## 2023-11-12 MED ORDER — ALPRAZOLAM 0.5 MG PO TABS: 0.5000 mg | ORAL_TABLET | Freq: Every evening | ORAL | Status: DC | PRN

## 2023-11-12 MED ORDER — GERHARDT'S BUTT CREAM
TOPICAL_CREAM | Freq: Three times a day (TID) | CUTANEOUS | Status: DC
  Administered 2023-11-12: 1 via TOPICAL
  Filled 2023-11-12: qty 60

## 2023-11-12 NOTE — Plan of Care (Signed)
 Problem: RH Balance Goal: LTG: Patient will maintain dynamic sitting balance (OT) Description: LTG:  Patient will maintain dynamic sitting balance with assistance during activities of daily living (OT) Flowsheets (Taken 11/12/2023 2209) LTG: Pt will maintain dynamic sitting balance during ADLs with: Supervision/Verbal cueing Goal: LTG Patient will maintain dynamic standing with ADLs (OT) Description: LTG:  Patient will maintain dynamic standing balance with assist during activities of daily living (OT)  Flowsheets (Taken 11/12/2023 2209) LTG: Pt will maintain dynamic standing balance during ADLs with: Contact Guard/Touching assist   Problem: Sit to Stand Goal: LTG:  Patient will perform sit to stand in prep for activites of daily living with assistance level (OT) Description: LTG:  Patient will perform sit to stand in prep for activites of daily living with assistance level (OT) Flowsheets (Taken 11/12/2023 2209) LTG: PT will perform sit to stand in prep for activites of daily living with assistance level: Contact Guard/Touching assist   Problem: RH Bathing Goal: LTG Patient will bathe all body parts with assist levels (OT) Description: LTG: Patient will bathe all body parts with assist levels (OT) Flowsheets (Taken 11/12/2023 2209) LTG: Pt will perform bathing with assistance level/cueing: Contact Guard/Touching assist   Problem: RH Dressing Goal: LTG Patient will perform upper body dressing (OT) Description: LTG Patient will perform upper body dressing with assist, with/without cues (OT). Flowsheets (Taken 11/12/2023 2209) LTG: Pt will perform upper body dressing with assistance level of: Supervision/Verbal cueing Goal: LTG Patient will perform lower body dressing w/assist (OT) Description: LTG: Patient will perform lower body dressing with assist, with/without cues in positioning using equipment (OT) Flowsheets (Taken 11/12/2023 2209) LTG: Pt will perform lower body dressing with  assistance level of: Contact Guard/Touching assist   Problem: RH Toileting Goal: LTG Patient will perform toileting task (3/3 steps) with assistance level (OT) Description: LTG: Patient will perform toileting task (3/3 steps) with assistance level (OT)  Flowsheets (Taken 11/12/2023 2209) LTG: Pt will perform toileting task (3/3 steps) with assistance level: Contact Guard/Touching assist   Problem: RH Functional Use of Upper Extremity Goal: LTG Patient will use RT/LT upper extremity as a (OT) Description: LTG: Patient will use right/left upper extremity as a stabilizer/gross assist/diminished/nondominant/dominant level with assist, with/without cues during functional activity (OT) Flowsheets (Taken 11/12/2023 2209) LTG: Use of upper extremity in functional activities: LUE as nondominant level LTG: Pt will use upper extremity in functional activity with assistance level of: Independent   Problem: RH Toilet Transfers Goal: LTG Patient will perform toilet transfers w/assist (OT) Description: LTG: Patient will perform toilet transfers with assist, with/without cues using equipment (OT) Flowsheets (Taken 11/12/2023 2209) LTG: Pt will perform toilet transfers with assistance level of: Contact Guard/Touching assist   Problem: RH Tub/Shower Transfers Goal: LTG Patient will perform tub/shower transfers w/assist (OT) Description: LTG: Patient will perform tub/shower transfers with assist, with/without cues using equipment (OT) Flowsheets (Taken 11/12/2023 2209) LTG: Pt will perform tub/shower stall transfers with assistance level of: Contact Guard/Touching assist   Problem: RH Memory Goal: LTG Patient will demonstrate ability for day to day recall/carry over during activities of daily living with assistance level (OT) Description: LTG:  Patient will demonstrate ability for day to day recall/carry over during activities of daily living with assistance level (OT). Flowsheets (Taken 11/12/2023 2209) LTG:   Patient will demonstrate ability for day to day recall/carry over during activities of daily living with assistance level (OT): Minimal Assistance - Patient > 75%   Problem: RH Attention Goal: LTG Patient will demonstrate this level  of attention during functional activites (OT) Description: LTG:  Patient will demonstrate this level of attention during functional activites  (OT) Flowsheets (Taken 11/12/2023 2209) Patient will demonstrate this level of attention during functional activites: Sustained Patient will demonstrate above attention level in the following environment: Home LTG: Patient will demonstrate this level of attention during functional activites (OT): Minimal Assistance - Patient > 75%   Problem: RH Awareness Goal: LTG: Patient will demonstrate awareness during functional activites type of (OT) Description: LTG: Patient will demonstrate awareness during functional activites type of (OT) Flowsheets (Taken 11/12/2023 2209) Patient will demonstrate awareness during functional activites type of: Emergent LTG: Patient will demonstrate awareness during functional activites type of (OT): Minimal Assistance - Patient > 75%

## 2023-11-12 NOTE — Evaluation (Signed)
 Speech Language Pathology Assessment and Plan  Patient Details  Name: Miranda Cochran MRN: [id number] Date of Birth: 1950-07-05  SLP Diagnosis: Dysphagia;Cognitive Impairments  Rehab Potential: Excellent ELOS: 14-18 days    Today's Date: 11/12/2023 SLP Individual Time: 0815-0900 SLP Individual Time Calculation (min): 45 min   Hospital Problem: Principal Problem:   TBI (traumatic brain injury) [facility])  Past Medical History:  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   GERD (gastroesophageal reflux disease)    on meds   Hiatal hernia    Hx of adenomatous polyp of colon 07/07/2009   Hyperlipidemia    diet controlled   Hypertension    on meds - LASIX    IBS (irritable bowel syndrome)    Iron  deficiency anemia    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 Willy Harvest, adenimatous polyps, repeat in 5 yrs   ESOPHAGOGASTRODUODENOSCOPY  07/06/2021   per Dr. Willy Harvest, showed portal hypertensive gastropathy, repeat in 2 yrs   ESOPHAGOGASTRODUODENOSCOPY (EGD) WITH PROPOFOL  N/A 06/20/2017   Procedure: ESOPHAGOGASTRODUODENOSCOPY (EGD) WITH PROPOFOL ;  Surgeon: Kenney Peacemaker, MD;  Location: [facility]  ENDOSCOPY;  Service: Endoscopy;  Laterality: N/A;  with Barrx   EYE SURGERY     IR IVC FILTER PLMT / S&I /IMG GUID/MOD SED  10/31/2023   NASAL SEPTOPLASTY W/ TURBINOPLASTY     POLYPECTOMY  2015   CG-TA   TONSILLECTOMY      Assessment / Plan / Recommendation Clinical Impression HPI: Pt is a 73 y/o female with PMH of autoimmune hepatitis with cirrhosis, HTN, depression, DM, IBS, osteoporosis, vascular disease, who was admitted to [facility] on 5/14 following a fall at home. EMS noted occipital hematoma and scalp lac with emesis x1 prior to arrival. In ED, labs showed hypokalemia with prolonged QTc on EKG, WBC 16.5, SBP >200. Imaging revealed SDH/SAH, as well as occipital skull fracture. 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 Consults to neurosurgery, PCCM. Serial head CTs show stable/slow improvement. She did develop seizures related to her TBI and neurology following and recommendations for keppra  1000 twice daily and Zio patch at discharge. She is currently NPO due to dysphagia and with a cortrack. Hospital course alk phos trending up requiring close monitoring of LFTs, RLE DVT with IVC filter placed on 5/19 (not a candidate for anticoagulation given falls/TBI), and SVT run on 5/26 which was managed with medication. Therapy ongoing and pt was recommended for CIR.   Clinical Impression:  Initial 15 minutes of evaluation missed due to nursing care.  Bedside Swallow Evaluation: A bedside swallow evaluation was completed to assess for s/sx of oropharyngeal dysphagia. Oral mechanism exam revealed reduced lingual ROM to the L and L facial asymmetry. POs administered included HTL and purees. Patient with mildly prolonged oral manipulation and intermittent s/sx of aspiration. Patient with occasional immediate cough/throat clears with HTL and puree, though this is consistent with MBS results which revealed subsequent clearance of penetrates. Recommend continuation of D1/HTL diet per  MBS recommendations with use of standardized precautions including sitting upright during PO, taking small bites/sips at a slow rate and intermittent throat clear. Recommend full supervision during mealtimes. Cognitive-Linguistic: Patient was evaluated via the Cognistat to assess cognitive-linguistic functioning. Patient with functional expressive and receptive language. Patient with moderate deficits in the realms of basic problem solving and memory characterized by difficulty during calculations, judgement, and delayed recall task. Patient with significant difficulty during visual construction task, though patient with poor sustained attention at the end of the session. Patient with reduced intellectual and emergent awareness of deficits/mistakes characterized by inability to recall cognitive/physical and swallowing changes. Recommend targeting intellectual awareness, basic problem solving, memory, and sustained attention during inpatient stay.  Dysarthria: Patient is 100% intelligible at the conversational level with mildly hoarse vocal quality.  Pt would benefit from skilled ST services to maximize dysphagia and cognition in order to maximize functional independence at d/c. Anticipate patient will require 24 hour supervision at d/c and f/u SLP services.    Skilled Therapeutic Interventions          Patient evaluated using a standardized cognitive linguistic assessment and bedside swallow evaluation to assess current cognitive, communicative and swallowing function. See above for details.    SLP Assessment  Patient will need skilled Speech Lanaguage Pathology Services during CIR admission    Recommendations  SLP Diet Recommendations: Dysphagia 1 (Puree);Honey Liquid Administration via: Cup Medication Administration: Crushed with puree Supervision: Full supervision/cueing for compensatory strategies Compensations: Slow rate;Small sips/bites;Clear throat intermittently Postural Changes and/or  Swallow Maneuvers: Seated upright 90 degrees Oral Care Recommendations: Oral care QID Patient destination: Home Follow up Recommendations: Home Health SLP;Outpatient SLP Equipment Recommended: None recommended by SLP    SLP Frequency 3 to 5 out of 7 days   SLP Duration  SLP Intensity  SLP Treatment/Interventions 14-18 days  Minumum of 1-2 x/day, 30 to 90 minutes  Cognitive remediation/compensation;Cueing hierarchy;Dysphagia/aspiration precaution training;Functional tasks;Internal/external aids;Patient/family education;Therapeutic Activities;Therapeutic Exercise    Pain Pain in bottom - care team aware  SLP Evaluation Cognition Overall Cognitive Status: Impaired/Different from baseline Arousal/Alertness: Awake/alert Orientation Level: Oriented X4 Year: 2025 Month: May Day of Week: Correct Attention: Sustained Sustained Attention: Impaired Sustained Attention Impairment: Verbal basic;Functional basic Memory: Impaired Memory Impairment: Decreased short term memory Decreased Short Term Memory: Functional basic;Verbal basic Awareness: Impaired Awareness Impairment: Intellectual impairment;Emergent impairment Problem Solving: Impaired Problem Solving Impairment: Verbal basic;Functional basic Behaviors: Restless Safety/Judgment: Impaired Comments: occasionally attempting to get out of bed without assistance  Comprehension Auditory Comprehension Overall Auditory Comprehension: Appears within functional limits for tasks assessed Yes/No Questions: Within Functional Limits Commands: Within Functional Limits Expression Expression Primary Mode of Expression: Verbal Verbal Expression Overall Verbal Expression: Appears within functional limits for tasks assessed Written Expression Dominant Hand: Right Oral Motor Oral Motor/Sensory Function Overall Oral Motor/Sensory Function: Moderate impairment Facial ROM: Reduced left Facial Symmetry: Abnormal symmetry right Facial  Strength: Reduced left Lingual ROM: Reduced left Lingual Symmetry: Abnormal symmetry left Lingual Strength: Reduced Mandible: Within Functional Limits Motor Speech Overall Motor Speech: Impaired Respiration: Within functional limits  Phonation: Hoarse Resonance: Within functional limits Articulation: Within functional limitis Intelligibility: Intelligible Motor Planning: Witnin functional limits  Care Tool Care Tool Cognition Ability to hear (with hearing aid or hearing appliances if normally used Ability to hear (with hearing aid or hearing appliances if normally used): 0. Adequate - no difficulty in normal conservation, social interaction, listening to TV   Expression of Ideas and Wants Expression of Ideas and Wants: 3. Some difficulty - exhibits some difficulty with expressing needs and ideas (e.g, some words or finishing thoughts) or speech is not clear   Understanding Verbal and Non-Verbal Content Understanding Verbal and Non-Verbal Content: 3. Usually understands - understands most conversations, but misses some part/intent of message. Requires cues at times to understand  Memory/Recall Ability Memory/Recall Ability : Current season;That he or she is in a hospital/hospital unit   Bedside Swallowing Assessment General Previous Swallow Assessment: 5/29 MBS Diet Prior to this Study: Dysphagia 1 (pureed);Moderately thick liquids (Level 3, honey thick) History of Recent Intubation: Yes Total duration of intubation (days): 9 days Date extubated: 11/04/23 Behavior/Cognition: Alert;Cooperative;Distractible;Requires cueing Oral Cavity - Dentition: Adequate natural dentition Self-Feeding Abilities: Needs assist Vision: Functional for self-feeding Patient Positioning: Upright in bed Volitional Cough: Weak Volitional Swallow: Able to elicit  Ice Chips Ice chips: Not tested Thin Liquid Thin Liquid: Not tested Nectar Thick Nectar Thick Liquid: Not tested Honey Thick Honey Thick  Liquid: Impaired Presentation: Cup;Self fed Pharyngeal Phase Impairments: Throat Clearing - Immediate Puree Puree: Impaired Presentation: Self Fed;Spoon Oral Phase Impairments: Poor awareness of bolus Pharyngeal Phase Impairments: Throat Clearing - Immediate Solid Solid: Not tested BSE Assessment Risk for Aspiration Impact on safety and function: Moderate aspiration risk;Severe aspiration risk Other Related Risk Factors: History of GERD;Cognitive impairment;Deconditioning  Short Term Goals: Week 1: SLP Short Term Goal 1 (Week 1): Patient will participate in dysphagia therapy to improve oropharyngeal swallow function given min multimodal A SLP Short Term Goal 2 (Week 1): Patient will demonstrate mildly complex problem solving abilities in functional situations given min multimodal A SLP Short Term Goal 3 (Week 1): Patient will recall and utilize memory compensatory aids given min multimodal A SLP Short Term Goal 4 (Week 1): Patient will demonstrate sustained attention during functional tasks for atleast 10 minutes given min multimodal A SLP Short Term Goal 5 (Week 1): Patient will verbalize cognitive and physical changes since admission given min multimodal A  Refer to Care Plan for Long Term Goals  Recommendations for other services: None   Discharge Criteria: Patient will be discharged from SLP if patient refuses treatment 3 consecutive times without medical reason, if treatment goals not met, if there is a change in medical status, if patient makes no progress towards goals or if patient is discharged from hospital.  The above assessment, treatment plan, treatment alternatives and goals were discussed and mutually agreed upon: by patient  Susa Bones M.A., CCC-SLP 11/12/2023, 11:26 AM

## 2023-11-12 NOTE — Consult Note (Signed)
 WOC Nurse Consult Note: Reason for Consult: blisters heels and MASD buttocks  Wound type: 1.  Deep Tissue Pressure Injuries B heels  2.  Severe moisture associated skin damage to buttocks/sacrum ICD-10 CM Codes for Irritant Dermatitis L24A2 - Due to fecal, urinary or dual incontinence L24A9 - Due to friction or contact with other specified body fluids Pressure Injury POA: Yes Measurement: see nursing flowsheet  Wound bed: 1.  Purple maroon discoloration and some blood filled blisters to B heels 2.  Widespread erythema with some partial thickness skin loss and slough to sacrum/bilateral buttocks  Drainage (amount, consistency, odor) see nursing flowsheet  Periwound: Dressing procedure/placement/frequency:  Cleanse B heels with soap and water , dry and apply Xeroform gauze ([place] [phone]) to areas of purple maroon discoloration and blistering daily, cover with dry gauze and Kerlix roll gauze. Place B feet in Prevalon boots to offload pressure Timm Foot [phone])  Cleanse sacrum/buttocks/posterior thighs with Vashe wound cleanser Timm Foot [phone]), do not rinse and allow to air dry. Apply Gerhardt's Butt Cream to entire area 3 times daily and prn soiling.   Patient may benefit from a low air loss for moisture management.    POC discussed with bedside nurse. WOC team will not follow. Re-consult if further needs arise.   Thank you,    Sheriff Rodenberg MSN, RN-BC, CWOCN [phone]  Conservative sharp wound debridement (CSWD performed at the bedside):

## 2023-11-12 NOTE — Progress Notes (Signed)
 Inpatient Rehabilitation Admission Medication Review by a Pharmacist  A complete drug regimen review was completed for this patient to identify any potential clinically significant medication issues.  High Risk Drug Classes Is patient taking? Indication by Medication  Antipsychotic Yes Compazine  prn N/V  Anticoagulant No   Antibiotic No   Opioid Yes Tramadol  prn pain  Antiplatelet Yes ASA - PAD, CVA  Hypoglycemics/insulin  Yes Insulin  - DM, tube feeds  Vasoactive Medication Yes Amiodarone  - Afib Hydralazine , metoprolol - BP  Chemotherapy No   Other Yes Buspirone Trazodone  prn sleep Levetiracetam  - seizures Ezetimibe  - HLD Pantoprazole  - reflux  Prednisone  - hepatitis  Thiamine  - supplement     Type of Medication Issue Identified Description of Issue Recommendation(s)  Drug Interaction(s) (clinically significant)     Duplicate Therapy     Allergy     No Medication Administration End Date     Incorrect Dose     Additional Drug Therapy Needed     Significant med changes from prior encounter (inform family/care partners about these prior to discharge).    Other       Clinically significant medication issues were identified that warrant physician communication and completion of prescribed/recommended actions by midnight of the next day:  No  Name of provider notified for urgent issues identified:   Provider Method of Notification:   Pharmacist comments: None  Time spent performing this drug regimen review (minutes):  20 minutes  Thank you. Lennice Quivers, PharmD

## 2023-11-12 NOTE — Progress Notes (Signed)
 Patient awake since 2am. Patient reports back pain and pain to buttocks. Tramadol  PRN given with some relief. Patient reports no relief with tylenol . PRN gerhardtt and barrier cream given for MASD to buttocks. Patient assisted with frequent repositioning and warm blankets. Patient anxious and restless when discussing wanting to go home and being alone in the hospital. Emotional support and reassurance provided throughout the shift.

## 2023-11-12 NOTE — Progress Notes (Addendum)
 Initial Nutrition Assessment  DOCUMENTATION CODES:   Not applicable  INTERVENTION:   -TF via cortrak:  Osmolite 1.5 @ 50 ml/hr for 20 hours (to allow to pause for 4 hours for participation with therapies)   60 ml Prosource TF20 BID.    200 ml free water  flush every 8 hours  Tube feeding regimen provides 1660 kcal (100% of needs), 103 grams of protein, and 762 ml of H2O. Total free water : 1362 ml daily  -Continue 100 mg thiamine  daily -Continue 60 ml Banatrol BID   NUTRITION DIAGNOSIS:   Inadequate oral intake related to dysphagia, poor appetite as evidenced by meal completion < 25%, per patient/family report.  GOAL:   Patient will meet greater than or equal to 90% of their needs  MONITOR:   TF tolerance  REASON FOR ASSESSMENT:   Consult Assessment of nutrition requirement/status  ASSESSMENT:   Pt with PMHx of HTN, DM type II, GAVE syndrome causing GI bleed, alcohol abuse, autoimmune hepatitis with cirrhosis of liver, chronically on prednisone , GERD, hiatal hernia, and PAD presented after falling and striking her head.  Patient was admitted on 10/26/2023  for TBI.  Pt admitted with CIR due to functional deficits secondary to TBI.   Pt unavailable at time of visit. Attempted to speak with pt via call to hospital room phone, however, unable to reach. RD unable to obtain further nutrition-related history or complete nutrition-focused physical exam at this time.    Per [facility] notes, pt with purple maroon discoloration and some blood filled blisters to bilateral heels and wide spread erythema and some partial thickness skin loss and slough to sacrum and bilateral buttocks.   Case discussed with RN, who reports pt is refusing oral intake. Pt refused breakfast this morning. She remains on a dysphagia 1 diet with honey thick liquids.   Pt receiving nutrition via cortrak tube secondary to very poor oral intake. Cortak placed on 11/02/23 and placement confirmed in stomach. Pt  currently receiving Osmolite 1.5 @ 45 ml/hr, 60 ml Prosource TF20 daily, and 200 ml free water  every 8 hours. Complete regimen provides 1700 kcals, 88 grams protein, and 823 ml free water  (1423 ml with inclusion of free water  flushes).   Banatrol added on 11/01/23; RD agree with this as pt husband complained of significant diarrhea when meeting with palliative care.   Reviewed wt hx; pt has experienced a 10.2% wt loss over the past 2 months, which is significant for time frame. Pt identified with severe malnutrition during acute hospitalization and suspect this is ongoing, but unable to identify at this time.   Medications reviewed and include buspar , keppra , proteonix, prednisone , thiamine , and vitamin B-12.   Labs reviewed: CBGS: 164-305 (inpatient orders for glycemic control are 0-15 units insulin  aspart every 4 hours, 3 units insulin  aspart every 4 hours, and 15 units insulin  glargine-yfgn daily).    Diet Order:   Diet Order             DIET - DYS 1 Room service appropriate? No; Fluid consistency: Honey Thick  Diet effective 0500                   EDUCATION NEEDS:   No education needs have been identified at this time  Skin:  Skin Assessment: Skin Integrity Issues: Skin Integrity Issues:: Other (Comment) Stage II: - Other: purple maroon discoloration and some blood filled blisters to bilateral heels and wide spread erythema and some partial thickness skin loss and slough to sacrum  and bilateral buttocks  Last BM:  11/01/23 (type 6)  Height:   Ht Readings from Last 1 Encounters:  11/11/23 5\' 4"  (1.626 m)    Weight:   Wt Readings from Last 1 Encounters:  11/11/23 50.9 kg    Ideal Body Weight:  54.5 kg  BMI:  Body mass index is 19.26 kg/m.  Estimated Nutritional Needs:   Kcal:  1600-1800  Protein:  85-100 grams  Fluid:  1.6-1.8 L    Herschel Lords, RD, LDN, CDCES Registered Dietitian III Certified Diabetes Care and Education Specialist If unable to reach  this RD, please use "RD Inpatient" group chat on secure chat between hours of 8am-4 pm daily

## 2023-11-12 NOTE — Progress Notes (Signed)
 Patients heels are red but blanchable on all areas except documented pressure injury. Not boggy to touch. Applied foot mepilex for prevention of breakdown.

## 2023-11-12 NOTE — Plan of Care (Signed)
  Problem: RH Swallowing Goal: LTG Patient will participate in dysphagia therapy to increase swallow function with assistance (SLP) Description: LTG:  Patient will participate in dysphagia therapy to increase swallow function with assistance (SLP) Flowsheets (Taken 11/12/2023 1121) LTG: Pt will participate in dysphagia therapy to increase swallow function with assistance of (SLP): Supervision Goal: LTG Pt will demonstrate functional change in swallow as evidenced by bedside/clinical objective assessment (SLP) Description: LTG: Patient will demonstrate functional change in swallow as evidenced by bedside/clinical objective assessment (SLP) Flowsheets (Taken 11/12/2023 1121) LTG: Patient will demonstrate functional change in swallow as evidenced by bedside/clinical objective assessment: Oropharyngeal swallow   Problem: RH Problem Solving Goal: LTG Patient will demonstrate problem solving for (SLP) Description: LTG:  Patient will demonstrate problem solving for basic/complex daily situations with cues  (SLP) Flowsheets (Taken 11/12/2023 1121) LTG: Patient will demonstrate problem solving for (SLP): (mildly complex) Other (comment) LTG Patient will demonstrate problem solving for: Supervision   Problem: RH Memory Goal: LTG Patient will use memory compensatory aids to (SLP) Description: LTG:  Patient will use memory compensatory aids to recall biographical/new, daily complex information with cues (SLP) Flowsheets (Taken 11/12/2023 1121) LTG: Patient will use memory compensatory aids to (SLP): Supervision   Problem: RH Attention Goal: LTG Patient will demonstrate this level of attention during functional activites (SLP) Description: LTG:  Patient will will demonstrate this level of attention during functional activites (SLP) Flowsheets (Taken 11/12/2023 1121) Patient will demonstrate during cognitive/linguistic activities the attention type of: Sustained LTG: Patient will demonstrate this level of  attention during cognitive/linguistic activities with assistance of (SLP): Supervision Number of minutes patient will demonstrate attention during cognitive/linguistic activities: 10   Problem: RH Awareness Goal: LTG: Patient will demonstrate awareness during functional activites type of (SLP) Description: LTG: Patient will demonstrate awareness during functional activites type of (SLP) Flowsheets (Taken 11/12/2023 1121) Patient will demonstrate during cognitive/linguistic activities awareness type of:  Intellectual  Emergent LTG: Patient will demonstrate awareness during cognitive/linguistic activities with assistance of (SLP): Supervision

## 2023-11-12 NOTE — Progress Notes (Signed)
 Portable called for kpad. No kpads available at this time. Patient asleep no signs of discomfort at this time .

## 2023-11-12 NOTE — Progress Notes (Addendum)
 PROGRESS NOTE   Subjective/Complaints:  No events overnight.  Patient complaining of severe anxiety, constant, interrupting sleep and making therapies difficult today.  States she takes Xanax  0.5 mg nightly as needed at home, with good results.  Prescribed by her PCP.  Multiple incontinent, large bowel movements overnight.  Incontinent of bowel and bladder. Complaining of 8 out of 10 back pain overnight.  She states this improves with use of a copper back brace from home; husband will bring in.  Alternates heat, ice, and menthol  lotion for this.    Refused p.o. intakes this a.m. Vitals overnight stable A.m. labs stable  ROS: Denies fevers, chills, N/V, abdominal pain, constipation, diarrhea, SOB, cough, chest pain, new weakness or paraesthesias.   + Anxiety/insomnia + Bowel and bladder incontinence + Back pain, chronic.  Objective:   No results found. Recent Labs    11/11/23 0513 11/12/23 0731  WBC 6.9 7.6  HGB 11.2* 11.1*  HCT 35.4* 35.7*  PLT 292 370   Recent Labs    11/11/23 0513 11/12/23 0731  NA 138 137  K 3.1* 3.8  CL 106 106  CO2 21* 19*  GLUCOSE 165* 160*  BUN 14 15  CREATININE 0.50 0.50  CALCIUM  8.6* 8.4*    Intake/Output Summary (Last 24 hours) at 11/12/2023 2304 Last data filed at 11/12/2023 1756 Gross per 24 hour  Intake 150 ml  Output --  Net 150 ml     Pressure Injury 11/11/23 Heel Left;Posterior Stage 2 -  Partial thickness loss of dermis presenting as a shallow open injury with a red, pink wound bed without slough. corner of heel appears as ruptured fluid filled blister 1.3x1.1 The surrounding area i (Active)  11/11/23 2120  Location: Heel  Location Orientation: Left;Posterior  Staging: Stage 2 -  Partial thickness loss of dermis presenting as a shallow open injury with a red, pink wound bed without slough.  Wound Description (Comments): corner of heel appears as ruptured fluid filled  blister 1.3x1.1 The surrounding area is blanchable.  Present on Admission: Yes    Physical Exam: Vital Signs Blood pressure 126/67, pulse 93, temperature 99.1 F (37.3 C), temperature source Oral, resp. rate 18, height 5\' 4"  (1.626 m), weight 50.9 kg, SpO2 97%. Constitutional: No apparent distress. Appropriate appearance for age.  Sitting up in wheelchair.   HENT: No JVD. Neck Supple. Trachea midline. Atraumatic, normocephalic. + Core track.  Eyes: PERRLA. EOMI. Visual fields grossly intact.  Cardiovascular: RRR, no murmurs/rub/gallops. No Edema. Peripheral pulses 2+  Respiratory: CTAB. No rales, rhonchi, or wheezing. On RA.  Abdomen: + bowel sounds, normoactive. No distention or tenderness.  Skin:  Multiple bruises and abrasions noted to bilat upper/lower extremities.  Healing occipital scalp laceration.      MSK:      No apparent deformity.  Able to mobilize all 4 extremities       Neurologic exam:  Cognition: AAO to person, place, time and event. Memory: Mild deficits, much improved than yesterday Insight: Good insight into current condition.  Mood: Extremely anxious. Sensation: To light touch intact in BL UEs and LEs  Reflexes: Hyporeflexic left upper and lower extremity.  2+ right upper and lower  extremity.  Negative Hoffmann's, Babinski. CN: Mild left facial droop, mild left tongue deviation. Coordination: No apparent tremors. No ataxia on FTN bilaterally Spasticity: MAS 0 in all extremities.       Strength:                RUE: 4/5 SA, 4/5 EF, 4/5 EE, 4/5 WE, 4/5 FF, 4/5 FA                LUE:  4-/5 SA, 4-/5 EF, 4-/5 EE, 4-/5 WE, 4-/5 FF, 4-/5 FA                RLE: 4/5 HF, 4/5 KE, 4/5  DF, 4/5  EHL, 4/5  PF                 LLE:  4/5 HF, 4/5 KE, 1/5  DF, 1/5  EHL, 3/5  PF    Assessment/Plan: 1. Functional deficits which require 3+ hours per day of interdisciplinary therapy in a comprehensive inpatient rehab setting. Physiatrist is providing close team supervision and 24  hour management of active medical problems listed below. Physiatrist and rehab team continue to assess barriers to discharge/monitor patient progress toward functional and medical goals  Care Tool:  Bathing    Body parts bathed by patient: Right arm, Left arm, Chest, Abdomen, Face   Body parts bathed by helper: Front perineal area, Buttocks, Right lower leg, Left lower leg     Bathing assist Assist Level: Moderate Assistance - Patient 50 - 74%     Upper Body Dressing/Undressing Upper body dressing   What is the patient wearing?: Pull over shirt    Upper body assist Assist Level: Minimal Assistance - Patient > 75%    Lower Body Dressing/Undressing Lower body dressing      What is the patient wearing?: Incontinence brief, Pants     Lower body assist Assist for lower body dressing: Maximal Assistance - Patient 25 - 49%     Toileting Toileting    Toileting assist Assist for toileting: Maximal Assistance - Patient 25 - 49%     Transfers Chair/bed transfer  Transfers assist     Chair/bed transfer assist level: Moderate Assistance - Patient 50 - 74%     Locomotion Ambulation   Ambulation assist      Assist level: 2 helpers Assistive device: Hand held assist Max distance: 12   Walk 10 feet activity   Assist     Assist level: 2 helpers Assistive device: Hand held assist   Walk 50 feet activity   Assist Walk 50 feet with 2 turns activity did not occur: Safety/medical concerns         Walk 150 feet activity   Assist Walk 150 feet activity did not occur: Safety/medical concerns         Walk 10 feet on uneven surface  activity   Assist Walk 10 feet on uneven surfaces activity did not occur: Safety/medical concerns         Wheelchair     Assist Is the patient using a wheelchair?: Yes Type of Wheelchair: Manual    Wheelchair assist level: Dependent - Patient 0%      Wheelchair 50 feet with 2 turns activity    Assist         Assist Level: Dependent - Patient 0%   Wheelchair 150 feet activity     Assist      Assist Level: Dependent - Patient 0%   Blood pressure 126/67, pulse  93, temperature 99.1 F (37.3 C), temperature source Oral, resp. rate 18, height 5\' 4"  (1.626 m), weight 50.9 kg, SpO2 97%.  Medical Problem List and Plan: 1. Functional deficits secondary to TBI             -patient may  shower             -ELOS/Goals: 14-18 days, Min A PT./OT./SLP              - Stable to admit to inpatient rehab   2.  Antithrombotics: On hold due to [facility]    -DVT/anticoagulation:  Mechanical:  Antiembolism stockings, knee (TED hose) Bilateral lower extremities Sequential compression devices, below knee Bilateral lower extremities Pharmaceutical: Other (comment)             -antiplatelet therapy: ASA 81 mg daily    3. Pain Management: Tramadol  and Tylenol  PRN  - 5-31: Add cold aqua thermia for back pain, multiple compression fractures.  Patient to bring in back brace from home.   4. Mood/Behavior/Sleep: LCSW to follow for evaluation and support. -Trazodone  PRN             -antipsychotic agents: N/A             -5-31: DC trazodone , resume home Xanax  0.5 mg nightly as needed.  Added BuSpar 5 mg 3 times daily and Atarax 10 mg 3 times daily as needed for anxiety.   5. Neuropsych/cognition: This patient is capable of making decisions on her own behalf.             - Bed alarm  - 5-31: Cognition much better today.  Completely appropriate and reasonable through exam. ?  Sundowning.   6. Skin/Wound Care: Routine pressure relief measures.             - Continue topical cream PRN   7. Fluids/Electrolytes/Nutrition: Monitor I/O's. CMET in a.m.  - 5/30 MBS recc Moderately thick liquids; Dysphagia 1--did not yet resume p.o. diet, will start on admission             - Continue tube feeds for now Osmolite, Prosource--will add p.o. diet in a.m., will discuss with dietary adjusting tube feeds to promote hunger -  Tube maintenance, nasotracheal suction, oral care 5-31: Tolerating p.o. intakes well.  Will discuss with dietary in a.m. switching tube feeds over to overnight only to promote hunger   8.  DM type II:  5/15 A1c= 6.8              - Continue Semglee  dose 15 units (decreased r/t hypoglycemic event on 5/30) -NovoLog  per SSI -Monitor blood sugars QID 5-31: Blood sugars elevated, but starting diet with continuous tube feeds.  Monitor with adjustments.  Recent Labs    11/12/23 1641 11/12/23 1952 11/12/23 2215  GLUCAP 282* 231* 162*     9.  Hyperlipidemia: Continue Zetia    10. BP/Heart rate-  -continue amiodarone  100 mg daily             - continue Lopressor  25 mg daily             - Cards follow-up- Zio patch at discharge  - BP stable, heart rate stable    11/12/2023    7:49 PM 11/12/2023    2:49 PM 11/12/2023    4:16 AM  Vitals with BMI  Systolic 126 121 161  Diastolic 67 83 80  Pulse 93 86 94    11.  Seizures: Continue Keppra  1000 mg twice  daily.   12.  Autoimmune hepatitis/cirrhosis: CBC in a.m. -Continue prednisone  10 mg              - Monitor LFT (alk phos trending up)  13.  GERD: Continue Protonix    14. HTN: Hydralazine  10 mg PRN             -Strict I/O, daily weights, monitor for signs of fluid overload             -monitor ortho stat vitals  See above   15.  Hx of EtOH abuse: Negative for DTs.              - Continue thiamine  and folic acid    16.  Acute hypoxic resp failure due to HCAPs-sputum culture 5/17 + E. coli and strep pneumonia             - S/P Evan days ABX last dose 5/23             - Guaifenesin  PRN    LOS: 1 days A FACE TO FACE EVALUATION WAS PERFORMED  Bea Lime 11/12/2023, 11:04 PM

## 2023-11-12 NOTE — Evaluation (Signed)
 Physical Therapy Assessment and Plan  Patient Details  Name: Miranda Cochran MRN: [id number] Date of Birth: 04/28/1951  PT Diagnosis: Abnormality of gait, Difficulty walking, Impaired cognition, Low back pain, and Muscle weakness Rehab Potential: Good ELOS: ~3 weeks   Today's Date: 11/12/2023 PT Individual Time: 1300-1400 PT Individual Time Calculation (min): 60 min    Hospital Problem: Principal Problem:   TBI (traumatic brain injury) (HCC)   Past Medical History:  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   GERD (gastroesophageal reflux disease)    on meds   Hiatal hernia    Hx of adenomatous polyp of colon 07/07/2009   Hyperlipidemia    diet controlled   Hypertension    on meds - LASIX    IBS (irritable bowel syndrome)    Iron  deficiency anemia    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 Willy Harvest, adenimatous polyps, repeat in 5 yrs   ESOPHAGOGASTRODUODENOSCOPY  07/06/2021   per Dr. Willy Harvest, showed portal hypertensive gastropathy, repeat in 2 yrs   ESOPHAGOGASTRODUODENOSCOPY (EGD) WITH PROPOFOL  N/A 06/20/2017   Procedure: ESOPHAGOGASTRODUODENOSCOPY (EGD) WITH PROPOFOL ;   Surgeon: Kenney Peacemaker, MD;  Location: [facility];  Service: Endoscopy;  Laterality: N/A;  with Barrx   EYE SURGERY     IR IVC FILTER PLMT / S&I /IMG GUID/MOD SED  10/31/2023   NASAL SEPTOPLASTY W/ TURBINOPLASTY     POLYPECTOMY  2015   CG-TA   TONSILLECTOMY      Assessment & Plan Clinical Impression: Miranda Cochran is a 73 year old female PMHx of HTN, DM type II, GAVE syndrome causing GI bleed, alcohol abuse, autoimmune hepatitis with cirrhosis of liver, chronically on prednisone , GERD, hiatal hernia, and PAD presented after falling and striking her head.  Patient was admitted on 10/26/2023  for TBI. ER labs showed hypokalemia with prolonged QTc on EKG, WBC 16.5, SBP > 200. CT significant for evidence of occipital skull fracture and traumatic subarachnoid hemorrhage and right subdural hematoma.  On 5/15 patient developed AMS and tachyarrhythmia.  Dr. Bonnita Buttner consulted, CT showed TBI with traumatic increasing right subdural hematoma, SAH and right temporal contusion. MRI showed small acute infarcts in bilateral cerebral hemispheres right more than left.  PCCM consulted Dr. Ardelle Kos intubated for worsening TBI, EEG revealed seizure activity, patient received Keppra /phenobarbital .  Serial head CT's have been stable and show improvement.     Hospital course significant for RLE DVT, IVC filter was placed by Dr. Nereida Banning she was not a candidate for anticoagulation given falls/TBI. Alk phos  trended upwards requiring close monitoring of LFT's. CTA chest negative for PE but showed LLL PNA.  Dr. Stann Earnest consulted for runs of SVT that later terminated with adenosine .  Lopressor  and amiodarone  initiated via tube.  TTE showed normal EF and moderate AS.  Coretrak placed 5/21 due to expressive aphasia and dysphagia SLP following.  She continues to be limited by left-sided weakness, expressive aphasia and dysphagia. She is mod assist and was independent PTA.  CIR recommended due to functional decline. Patient transferred  to CIR on 11/11/2023 .   Patient currently requires mod with mobility secondary to muscle weakness, impaired timing and sequencing, unbalanced muscle activation, and decreased coordination, decreased initiation, decreased attention, decreased awareness, decreased problem solving, decreased memory, and delayed processing, and decreased standing balance, decreased postural control, and decreased balance strategies.  Prior to hospitalization, patient was independent  with mobility and lived with Spouse in a Apartment home.  Home access is  Level entry.  Patient will benefit from skilled PT intervention to maximize safe functional mobility, minimize fall risk, and decrease caregiver burden for planned discharge home with 24 hour assist.  Anticipate patient will benefit from follow up [facility] at discharge.  PT - End of Session Activity Tolerance: Tolerates 30+ min activity with multiple rests Endurance Deficit: Yes PT Assessment Rehab Potential (ACUTE/IP ONLY): Good PT Barriers to Discharge: Home environment access/layout;Weight;Incontinence PT Patient demonstrates impairments in the following area(s): Balance;Skin Integrity;Behavior;Pain;Perception;Endurance;Safety;Motor PT Transfers Functional Problem(s): Bed Mobility;Bed to Chair;Car PT Locomotion Functional Problem(s): Ambulation;Wheelchair Mobility;Stairs PT Plan PT Intensity: Minimum of 1-2 x/day ,45 to 90 minutes PT Frequency: 5 out of 7 days PT Duration Estimated Length of Stay: ~3 weeks PT Treatment/Interventions: Ambulation/gait training;Cognitive remediation/compensation;Discharge planning;DME/adaptive equipment instruction;Functional mobility training;Pain management;Psychosocial support;Splinting/orthotics;Therapeutic Activities;UE/LE Strength taining/ROM;Visual/perceptual remediation/compensation;Wheelchair propulsion/positioning;UE/LE Coordination activities;Therapeutic Exercise;Stair training;Skin care/wound management;Patient/family  education;Neuromuscular re-education;Functional electrical stimulation;Disease management/prevention;Community reintegration;Balance/vestibular training PT Transfers Anticipated Outcome(s): CGA with LRAD PT Locomotion Anticipated Outcome(s): CGA with LRAD PT Recommendation Follow Up Recommendations: 24 hour supervision/assistance Patient destination: Home Equipment Recommended: To be determined   PT Evaluation Precautions/Restrictions Precautions Precautions: Fall Recall of Precautions/Restrictions: Impaired Precaution/Restrictions Comments: Prior compression fxs, no formal precautions ordered. Cortrak. Restrictions Weight Bearing Restrictions Per Provider Order: No General   Vital SignsTherapy Vitals Temp: 98.3 F (36.8 C) Pulse Rate: 86 Resp: 18 BP: 121/83 Patient Position (if appropriate): Sitting Oxygen Therapy SpO2: 99 % O2 Device: Room Air Pain   Pain Interference Pain Interference Pain Effect on Sleep: 3. Frequently Pain Interference with Therapy Activities: 2. Occasionally Pain Interference with Day-to-Day Activities: 2. Occasionally Home Living/Prior Functioning Home Living Available Help at Discharge: Family Type of Home: Apartment Home Access: Level entry Home Layout: One level  Lives With: Spouse Prior Function Level of Independence: Independent with basic ADLs;Independent with gait;Independent with transfers  Able to Take Stairs?: No Driving: Yes Vocation: Part time employment Vision/Perception  Vision - History Ability to See in Adequate Light: 1 Impaired Perception Perception: Within Functional Limits Praxis Praxis: Impaired Praxis Impairment Details: Initiation;Motor planning;Organization  Cognition Overall Cognitive Status: Impaired/Different from baseline Arousal/Alertness: Awake/alert Orientation Level: Oriented X4 Year: 2025 Month: May Day of Week: Correct Attention: Sustained Sustained Attention: Impaired Sustained Attention  Impairment: Functional basic;Verbal basic Memory: Impaired Memory Impairment: Decreased short term memory;Decreased recall of new information Decreased Short Term Memory: Functional basic;Verbal basic Awareness: Impaired Awareness Impairment: Intellectual impairment;Emergent impairment Problem Solving: Impaired Problem Solving Impairment: Verbal basic;Functional basic Executive Function: Reasoning;Organizing;Decision Making;Initiating Reasoning: Impaired Organizing: Impaired Decision Making: Impaired Initiating: Impaired Behaviors: Restless Safety/Judgment: Impaired Comments:  Increased restlessness and mild impulsivity. [place] Scales of Cognitive Functioning: Confused, Inappropriate Non-Agitated: Maximal Assistance Sensation Sensation Light Touch: Appears Intact Hot/Cold: Appears Intact Proprioception: Not tested Stereognosis: Not tested Coordination Gross Motor Movements are Fluid and Coordinated: No Fine Motor Movements are Fluid and Coordinated: No Coordination and Movement Description: Deficits due generalized weakness; mild LUE dysmetria Finger Nose Finger Test: Mildly dysmetric Motor  Motor Motor: Abnormal postural alignment and control Motor - Skilled Clinical Observations: Generalized weakeness (L>R)   Trunk/Postural Assessment  Cervical Assessment Cervical Assessment: Exceptions to [facility] Thoracic Assessment Thoracic Assessment: Exceptions to [facility] Lumbar Assessment Lumbar Assessment: Exceptions to [facility] Postural Control Postural Control: Deficits on evaluation Trunk Control: Decreased Righting Reactions: Decreased/delayed Protective Responses: Decreased/delayed  Balance Balance Balance Assessed: Yes Static Sitting Balance Static Sitting - Balance Support: Feet supported Static Sitting - Level of Assistance: 5: Stand by assistance;4: Min assist Dynamic Sitting Balance Dynamic Sitting - Balance Support: During functional activity Dynamic Sitting -  Level of Assistance: 4: Min assist;3: Mod assist Dynamic Sitting - Balance Activities: Forward lean/weight shifting;Reaching for Higher education careers adviser Standing - Balance Support: Bilateral upper extremity supported;During functional activity Static Standing - Level of Assistance: 4: Min assist Dynamic Standing Balance Dynamic Standing - Balance Support: Bilateral upper extremity supported;During functional activity Dynamic Standing - Level of Assistance: 3: Mod assist Dynamic Standing - Balance Activities: Lateral lean/weight shifting;Forward lean/weight shifting Extremity Assessment  RUE Assessment RUE Assessment: Exceptions to [facility] Active Range of Motion (AROM) Comments: WFL General Strength Comments: 3-/5 LUE Assessment LUE Assessment: Exceptions to [facility] Active Range of Motion (AROM) Comments: WFL General Strength Comments: 3-/5 RLE Assessment RLE Assessment: Exceptions to [facility] General Strength Comments: 3+/5 grossly LLE Assessment LLE Assessment: Exceptions to [facility] General Strength Comments: grossly 3+/5  Care Tool Care Tool Bed Mobility Roll left and right activity   Roll left and right assist level: Minimal Assistance - Patient > 75%    Sit to lying activity   Sit to lying assist level: Contact Guard/Touching assist    Lying to sitting on side of bed activity   Lying to sitting on side of bed assist level: the ability to move from lying on the back to sitting on the side of the bed with no back support.: Minimal Assistance - Patient > 75%     Care Tool Transfers Sit to stand transfer   Sit to stand assist level: Moderate Assistance - Patient 50 - 74%    Chair/bed transfer   Chair/bed transfer assist level: Moderate Assistance - Patient 50 - 74%    Car transfer Car transfer activity did not occur: Safety/medical concerns        Care Tool Locomotion Ambulation   Assist level: 2 helpers Assistive device: Hand held assist Max distance: 12  Walk 10  feet activity   Assist level: 2 helpers Assistive device: Hand held assist   Walk 50 feet with 2 turns activity Walk 50 feet with 2 turns activity did not occur: Safety/medical concerns      Walk 150 feet activity Walk 150 feet activity did not occur: Safety/medical concerns      Walk 10 feet on uneven surfaces activity Walk 10 feet on uneven surfaces activity did not occur: Safety/medical concerns      Stairs Stair activity did not occur: Safety/medical concerns        Walk up/down 1 step activity Walk up/down 1 step or curb (drop down) activity did not occur: Safety/medical concerns  Walk up/down 4 steps activity Walk up/down 4 steps activity did not occur: Safety/medical concerns      Walk up/down 12 steps activity Walk up/down 12 steps activity did not occur: Safety/medical concerns      Pick up small objects from floor Pick up small object from the floor (from standing position) activity did not occur: Safety/medical concerns      Wheelchair Is the patient using a wheelchair?: Yes Type of Wheelchair: Manual   Wheelchair assist level: Dependent - Patient 0%    Wheel 50 feet with 2 turns activity   Assist Level: Dependent - Patient 0%  Wheel 150 feet activity   Assist Level: Dependent - Patient 0%    Refer to Care Plan for Long Term Goals  SHORT TERM GOAL WEEK 1 PT Short Term Goal 1 (Week 1): Pt will perform bed/chair transfers with min a PT Short Term Goal 2 (Week 1): Pt will ambulate with assist of 1 PT Short Term Goal 3 (Week 1): Pt will perform STS with min a consistently  Recommendations for other services: None   Skilled Therapeutic Intervention Evaluation completed (see details above) with patient education regarding purpose of PT evaluation, PT POC and goals, therapy schedule, weekly team meetings, and other CIR information including safety plan and fall risk safety. Pt reports unrated chronic low back pain, provided ice pack.  Pt performed the below  functional mobility tasks with the specified levels of skilled cuing and assistance.  Pt perseverative on wanting to go home to her bed and her pets requiring frequent redirection. Pt remained in w/c with husband present and was left with all needs in reach and alarm active.   Mobility Transfers Transfers: Sit to Stand;Stand to Sit;Stand Pivot Transfers Sit to Stand: Minimal Assistance - Patient > 75%;Moderate Assistance - Patient 50-74% Stand to Sit: Minimal Assistance - Patient > 75%;Moderate Assistance - Patient 50-74% Stand Pivot Transfers: Moderate Assistance - Patient 50 - 74% Stand Pivot Transfer Details: Verbal cues for sequencing;Verbal cues for technique;Verbal cues for precautions/safety Transfer (Assistive device): 1 person hand held assist Locomotion  Gait Ambulation: Yes Gait Assistance: 2 Helpers Gait Distance (Feet): 12 Feet Assistive device: 2 person hand held assist Gait Assistance Details: Verbal cues for precautions/safety;Verbal cues for safe use of DME/AE;Verbal cues for sequencing;Verbal cues for gait pattern Gait Gait: Yes Gait Pattern: Impaired Gait Pattern: Step-through pattern;Decreased stride length;Shuffle Stairs / Additional Locomotion Stairs: No Wheelchair Mobility Wheelchair Mobility: Yes Wheelchair Assistance: Dependent - Patient 0% Wheelchair Parts Management: Needs assistance   Discharge Criteria: Patient will be discharged from PT if patient refuses treatment 3 consecutive times without medical reason, if treatment goals not met, if there is a change in medical status, if patient makes no progress towards goals or if patient is discharged from hospital.  The above assessment, treatment plan, treatment alternatives and goals were discussed and mutually agreed upon: by patient and by family  Tex Filbert 11/12/2023, 3:57 PM

## 2023-11-12 NOTE — Evaluation (Addendum)
 Occupational Therapy Assessment and Plan  Patient Details  Name: Miranda Cochran MRN: [id number] Date of Birth: [birth year]  OT Diagnosis: abnormal posture, acute pain, altered mental status, cognitive deficits, lumbago (low back pain), muscle weakness (generalized), and decreased activity tolerance Rehab Potential: Rehab Potential (ACUTE ONLY): Fair ELOS: ~3 weeks   Today's Date: 11/12/2023 OT Individual Time: 5621-3086 OT Individual Time Calculation (min): 66 min     Hospital Problem: Principal Problem:   TBI (traumatic brain injury) (HCC)   Past Medical History:  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   GERD (gastroesophageal reflux disease)    on meds   Hiatal hernia    Hx of adenomatous polyp of colon 07/07/2009   Hyperlipidemia    diet controlled   Hypertension    on meds - LASIX    IBS (irritable bowel syndrome)    Iron  deficiency anemia    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 Willy Harvest, adenimatous polyps, repeat in 5 yrs   ESOPHAGOGASTRODUODENOSCOPY  07/06/2021   per Dr. Willy Harvest, showed portal hypertensive gastropathy, repeat in 2 yrs    ESOPHAGOGASTRODUODENOSCOPY (EGD) WITH PROPOFOL  N/A 06/20/2017   Procedure: ESOPHAGOGASTRODUODENOSCOPY (EGD) WITH PROPOFOL ;  Surgeon: Kenney Peacemaker, MD;  Location: [facility];  Service: Endoscopy;  Laterality: N/A;  with Barrx   EYE SURGERY     IR IVC FILTER PLMT / S&I /IMG GUID/MOD SED  10/31/2023   NASAL SEPTOPLASTY W/ TURBINOPLASTY     POLYPECTOMY  2015   CG-TA   TONSILLECTOMY      Assessment & Plan Clinical Impression: Patient is a 73 year old female PMHx of HTN, DM type II, GAVE syndrome causing GI bleed, alcohol abuse, autoimmune hepatitis with cirrhosis of liver, chronically on prednisone , GERD, hiatal hernia, and PAD presented after falling and striking her head.  Patient was admitted on 10/26/2023  for TBI. ER labs showed hypokalemia with prolonged QTc on EKG, WBC 16.5, SBP > 200. CT significant for evidence of occipital skull fracture and traumatic subarachnoid hemorrhage and right subdural hematoma.  On 5/15 patient developed AMS and tachyarrhythmia.  Dr. Bonnita Buttner consulted, CT showed TBI with traumatic increasing right subdural hematoma, SAH and right temporal contusion. MRI showed small acute infarcts in bilateral cerebral hemispheres right more than left.  PCCM consulted Dr. Ardelle Kos intubated for worsening TBI, EEG revealed seizure activity, patient received Keppra /phenobarbital .  Serial head CT's have been stable and show improvement.     Hospital course significant for RLE DVT, IVC filter was placed by Dr. Nereida Banning she  was not a candidate for anticoagulation given falls/TBI. Alk phos trended upwards requiring close monitoring of LFT's. CTA chest negative for PE but showed LLL PNA.  Dr. Stann Earnest consulted for runs of SVT that later terminated with adenosine .  Lopressor  and amiodarone  initiated via tube.  TTE showed normal EF and moderate AS.  Coretrak placed 5/21 due to expressive aphasia and dysphagia SLP following.  She continues to be limited by left-sided weakness, expressive aphasia  and dysphagia. She is mod assist and was independent PTA.  CIR recommended due to functional decline. Patient transferred to CIR on 11/11/2023 .    Patient currently requires mod with basic self-care skills secondary to muscle weakness, decreased cardiorespiratoy endurance, decreased coordination and decreased motor planning, decreased initiation, decreased attention, decreased awareness, decreased problem solving, decreased safety awareness, decreased memory, and demonstrates behaviors consistent with Rancho Level 5, and decreased sitting balance, decreased standing balance, decreased postural control, and decreased balance strategies.  Prior to hospitalization, patient could complete BADLs with independent .  Patient will benefit from skilled intervention to decrease level of assist with basic self-care skills prior to discharge home with care partner.  Anticipate patient will require 24 hour supervision and follow up home health.  OT - End of Session Activity Tolerance: Tolerates < 10 min activity, no significant change in vital signs Endurance Deficit: Yes OT Assessment Rehab Potential (ACUTE ONLY): Fair OT Barriers to Discharge: Behavior;Nutrition means OT Patient demonstrates impairments in the following area(s): Balance;Behavior;Cognition;Endurance;Motor;Pain;Nutrition;Perception;Safety OT Basic ADL's Functional Problem(s): Bathing;Dressing;Toileting;Grooming;Eating OT Transfers Functional Problem(s): Toilet;Tub/Shower OT Plan OT Intensity: Minimum of 1-2 x/day, 45 to 90 minutes OT Frequency: 5 out of 7 days OT Duration/Estimated Length of Stay: ~3 weeks OT Treatment/Interventions: Balance/vestibular training;Cognitive remediation/compensation;Community reintegration;Discharge planning;Disease mangement/prevention;DME/adaptive equipment instruction;Neuromuscular re-education;Functional mobility training;Functional electrical stimulation;Pain management;Psychosocial support;Patient/family  education;Self Care/advanced ADL retraining;Skin care/wound managment;Splinting/orthotics;UE/LE Strength taining/ROM;Therapeutic Activities;UE/LE Coordination activities;Visual/perceptual remediation/compensation;Therapeutic Exercise;Wheelchair propulsion/positioning OT Self Feeding Anticipated Outcome(s): Supervision OT Basic Self-Care Anticipated Outcome(s): Min A OT Toileting Anticipated Outcome(s): Min A OT Bathroom Transfers Anticipated Outcome(s): CGA OT Recommendation Patient destination: Home Follow Up Recommendations: Home health OT Equipment Recommended: To be determined   OT Evaluation Precautions/Restrictions  Precautions Precautions: Fall Recall of Precautions/Restrictions: Impaired Precaution/Restrictions Comments: Prior compression fxs, no formal precautions ordered. Cortrak. 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 Level of Independence: Independent with basic ADLs, Independent with gait, Independent with transfers Vocation: Part time employment Vision Baseline Vision/History: 1 Wears glasses (Readers) Ability to See in Adequate Light: 1 Impaired Patient Visual Report: No change from baseline Vision Assessment?: Wears glasses for reading;Vision impaired- to be further tested in functional context Perception  Perception: Within Functional Limits Praxis Praxis:  Impaired Praxis Impairment Details: Initiation;Motor planning;Organization Cognition Cognition Overall Cognitive Status: Impaired/Different from baseline Arousal/Alertness: Awake/alert Orientation Level: Person;Place Memory: Impaired Memory Impairment: Decreased short term memory;Decreased recall of new information Decreased Short Term Memory: Functional basic;Verbal basic Attention: Sustained Sustained Attention: Impaired Sustained Attention Impairment: Functional basic;Verbal basic Awareness: Impaired Awareness Impairment: Intellectual impairment;Emergent impairment Problem Solving: Impaired Problem Solving Impairment:  Verbal basic;Functional basic Executive Function: Reasoning;Organizing;Decision Making;Initiating Reasoning: Impaired Organizing: Impaired Decision Making: Impaired Initiating: Impaired Behaviors: Restless Safety/Judgment: Impaired Comments: Increased restlessness and mild impulsivity. [place] Scales of Cognitive Functioning: Confused, Inappropriate Non-Agitated: Maximal Assistance Brief Interview for Mental Status (BIMS) Repetition of Three Words (First Attempt): 3 Temporal Orientation: Year: Correct Temporal Orientation: Month: Accurate within 5 days Temporal Orientation: Day: Correct Recall: "Sock": No, could not recall Recall: "Blue": Yes, after cueing ("a color") Recall: "Bed": No, could not recall BIMS Summary Score: 10 Sensation Sensation Light Touch: Appears Intact Hot/Cold: Appears Intact Proprioception: Not tested Stereognosis: Not tested Coordination Gross Motor Movements are Fluid and Coordinated: No Fine Motor Movements are Fluid and Coordinated: No Coordination and Movement Description: Deficits due generalized weakness; mild LUE dysmetria Finger Nose Finger Test: Mildly dysmetric Motor  Motor Motor: Abnormal postural alignment and control Motor - Skilled Clinical Observations: Generalized weakeness (L>R)  Trunk/Postural  Assessment  Cervical Assessment Cervical Assessment: Exceptions to [facility] (Forward head) Thoracic Assessment Thoracic Assessment: Exceptions to [facility] (Rounded shoulders) Lumbar Assessment Lumbar Assessment: Exceptions to [facility] (Posterior pelvic tilt) Postural Control Postural Control: Deficits on evaluation Trunk Control: Decreased Righting Reactions: Decreased/delayed Protective Responses: Decreased/delayed  Balance Balance Balance Assessed: Yes Static Sitting Balance Static Sitting - Balance Support: Feet supported Static Sitting - Level of Assistance: 5: Stand by assistance;4: Min assist (CGA-Min A) Dynamic Sitting Balance Dynamic Sitting - Balance Support: During functional activity Dynamic Sitting - Level of Assistance: 4: Min assist;3: Mod assist Dynamic Sitting - Balance Activities: Forward lean/weight shifting;Reaching for Higher education careers adviser Standing - Balance Support: Bilateral upper extremity supported;During functional activity Static Standing - Level of Assistance: 4: Min assist Dynamic Standing Balance Dynamic Standing - Balance Support: Bilateral upper extremity supported;During functional activity Dynamic Standing - Level of Assistance: 3: Mod assist Dynamic Standing - Balance Activities: Lateral lean/weight shifting;Forward lean/weight shifting Extremity/Trunk Assessment RUE Assessment RUE Assessment: Exceptions to [facility] Active Range of Motion (AROM) Comments: WFL General Strength Comments: 3-/5 LUE Assessment LUE Assessment: Exceptions to [facility] Active Range of Motion (AROM) Comments: WFL General Strength Comments: 3-/5  Care Tool---SEE FLOWSHEETS  Care Tool Cognition  Expression of Ideas and Wants Expression of Ideas and Wants: 2. Frequent difficulty - frequently exhibits difficulty with expressing needs and ideas  Understanding Verbal and Non-Verbal Content Understanding Verbal and Non-Verbal Content: 3. Usually understands - understands most  conversations, but misses some part/intent of message. Requires cues at times to understand   Memory/Recall Ability Memory/Recall Ability : That he or she is in a hospital/hospital unit   Refer to Care Plan for Long Term Goals  SHORT TERM GOAL WEEK 1 OT Short Term Goal 1 (Week 1): Pt will attend to functional task with Mod levels of cuing. OT Short Term Goal 2 (Week 1): Pt will thread LB garments with Min A. OT Short Term Goal 3 (Week 1): Pt will initiate functional task with Mod levels of cuing.  Recommendations for other services: None    Skilled Therapeutic Intervention Session began with introduction to OT role, OT POC, and general orientation to rehab unit/schedule. Pt completes full-body dressing with levels of assistance noted below. Spouse present and providing encouragement to participate in session. Updated nursing staff on benefits of switching patient's room closer to nurses station due to increased impulsivity/restlessness. Pt remained sitting in WC with posey belt alarmed.  ADL ADL Eating: Minimal assistance Where Assessed-Eating: Bed level Grooming: Setup;Minimal assistance Where Assessed-Grooming: Sitting at sink Upper Body Bathing: Minimal assistance;Setup Where  Assessed-Upper Body Bathing: Edge of bed Lower Body Bathing: Moderate assistance;Maximal assistance Where Assessed-Lower Body Bathing: Edge of bed Upper Body Dressing: Setup;Minimal assistance Where Assessed-Upper Body Dressing: Edge of bed Lower Body Dressing: Moderate assistance;Maximal assistance Where Assessed-Lower Body Dressing: Edge of bed Toileting: Moderate assistance;Maximal assistance Where Assessed-Toileting: Toilet;Bedside Commode Toilet Transfer: Moderate assistance;Minimal assistance Toilet Transfer Method: Stand pivot Toilet Transfer Equipment: Bedside commode Tub/Shower Transfer: Unable to assess Film/video editor: Unable to assess Mobility  Transfers Sit to Stand: Minimal  Assistance - Patient > 75%;Moderate Assistance - Patient 50-74% Stand to Sit: Minimal Assistance - Patient > 75%;Moderate Assistance - Patient 50-74%   Discharge Criteria: Patient will be discharged from OT if patient refuses treatment 3 consecutive times without medical reason, if treatment goals not met, if there is a change in medical status, if patient makes no progress towards goals or if patient is discharged from hospital.  The above assessment, treatment plan, treatment alternatives and goals were discussed and mutually agreed upon: by patient and by family  Artemus Biles, OTR/L, MSOT  11/12/2023, 12:57 PM

## 2023-11-13 ENCOUNTER — Observation Stay (HOSPITAL_COMMUNITY)
Admission: EM | Admit: 2023-11-13 | Discharge: 2023-11-15 | Disposition: A | Discharge status: Home Health | Source: Ambulatory Visit | Attending: Obstetrics and Gynecology | Admitting: Obstetrics and Gynecology

## 2023-11-13 ENCOUNTER — Encounter (HOSPITAL_COMMUNITY): Payer: Self-pay | Admitting: Hospitalist

## 2023-11-13 ENCOUNTER — Encounter (HOSPITAL_COMMUNITY): Payer: Self-pay

## 2023-11-13 ENCOUNTER — Other Ambulatory Visit: Payer: Self-pay

## 2023-11-13 DIAGNOSIS — S069XAA Unspecified intracranial injury with loss of consciousness status unknown, initial encounter: Secondary | ICD-10-CM | POA: Diagnosis not present

## 2023-11-13 DIAGNOSIS — E7800 Pure hypercholesterolemia, unspecified: Secondary | ICD-10-CM

## 2023-11-13 DIAGNOSIS — I959 Hypotension, unspecified: Secondary | ICD-10-CM

## 2023-11-13 DIAGNOSIS — I10 Essential (primary) hypertension: Secondary | ICD-10-CM | POA: Insufficient documentation

## 2023-11-13 DIAGNOSIS — I471 Supraventricular tachycardia, unspecified: Principal | ICD-10-CM | POA: Diagnosis present

## 2023-11-13 DIAGNOSIS — I4719 Other supraventricular tachycardia: Secondary | ICD-10-CM | POA: Diagnosis not present

## 2023-11-13 DIAGNOSIS — Z8616 Personal history of COVID-19: Secondary | ICD-10-CM | POA: Insufficient documentation

## 2023-11-13 DIAGNOSIS — K7469 Other cirrhosis of liver: Secondary | ICD-10-CM | POA: Insufficient documentation

## 2023-11-13 DIAGNOSIS — Z794 Long term (current) use of insulin: Secondary | ICD-10-CM | POA: Insufficient documentation

## 2023-11-13 DIAGNOSIS — Z7982 Long term (current) use of aspirin: Secondary | ICD-10-CM | POA: Insufficient documentation

## 2023-11-13 DIAGNOSIS — Z79899 Other long term (current) drug therapy: Secondary | ICD-10-CM | POA: Insufficient documentation

## 2023-11-13 DIAGNOSIS — Z86718 Personal history of other venous thrombosis and embolism: Secondary | ICD-10-CM | POA: Insufficient documentation

## 2023-11-13 DIAGNOSIS — I639 Cerebral infarction, unspecified: Secondary | ICD-10-CM

## 2023-11-13 DIAGNOSIS — S065X0A Traumatic subdural hemorrhage without loss of consciousness, initial encounter: Secondary | ICD-10-CM | POA: Insufficient documentation

## 2023-11-13 DIAGNOSIS — E119 Type 2 diabetes mellitus without complications: Secondary | ICD-10-CM | POA: Insufficient documentation

## 2023-11-13 DIAGNOSIS — X58XXXA Exposure to other specified factors, initial encounter: Secondary | ICD-10-CM | POA: Insufficient documentation

## 2023-11-13 LAB — GLUCOSE, CAPILLARY
Glucose-Capillary: 207 mg/dL — ABNORMAL HIGH (ref 70–99)
Glucose-Capillary: 198 mg/dL — ABNORMAL HIGH (ref 70–99)
Glucose-Capillary: 161 mg/dL — ABNORMAL HIGH (ref 70–99)
Glucose-Capillary: 321 mg/dL — ABNORMAL HIGH (ref 70–99)
Glucose-Capillary: 286 mg/dL — ABNORMAL HIGH (ref 70–99)
Glucose-Capillary: 153 mg/dL — ABNORMAL HIGH (ref 70–99)

## 2023-11-13 LAB — BASIC METABOLIC PANEL WITH GFR
Anion gap: 9 (ref 5–15)
BUN: 20 mg/dL (ref 8–23)
CO2: 24 mmol/L (ref 22–32)
Calcium: 8.5 mg/dL — ABNORMAL LOW (ref 8.9–10.3)
Chloride: 102 mmol/L (ref 98–111)
Creatinine, Ser: 0.52 mg/dL (ref 0.44–1.00)
GFR, Estimated: >60 mL/min (ref >60)
Glucose, Bld: 178 mg/dL — ABNORMAL HIGH (ref 70–99)
Potassium: 4.1 mmol/L (ref 3.5–5.1)
Sodium: 135 mmol/L (ref 135–145)

## 2023-11-13 LAB — CBC
HCT: 34.7 % — ABNORMAL LOW (ref 36.0–46.0)
Hemoglobin: 11.2 g/dL — ABNORMAL LOW (ref 12.0–15.0)
MCH: 31.5 pg (ref 26.0–34.0)
MCHC: 32.3 g/dL (ref 30.0–36.0)
MCV: 97.7 fL (ref 80.0–100.0)
Platelets: 344 10*3/uL (ref 150–400)
RBC: 3.55 MIL/uL — ABNORMAL LOW (ref 3.87–5.11)
RDW: 15 % (ref 11.5–15.5)
WBC: 7.5 10*3/uL (ref 4.0–10.5)
nRBC: 0 % (ref 0.0–0.2)

## 2023-11-13 MED ORDER — INSULIN ASPART 100 UNIT/ML IJ SOLN
0.0000 [IU] | Freq: Three times a day (TID) | INTRAMUSCULAR | Status: DC
  Administered 2023-11-13: 3 [IU] via SUBCUTANEOUS
  Administered 2023-11-14: 8 [IU] via SUBCUTANEOUS
  Administered 2023-11-14: 2 [IU] via SUBCUTANEOUS
  Administered 2023-11-15: 8 [IU] via SUBCUTANEOUS

## 2023-11-13 MED ORDER — METOPROLOL TARTRATE 25 MG PO TABS
25.0000 mg | ORAL_TABLET | Freq: Three times a day (TID) | ORAL | Status: DC
  Filled 2023-11-13: qty 1

## 2023-11-13 MED ORDER — INSULIN ASPART 100 UNIT/ML IJ SOLN: 0.0000 [IU] | Freq: Three times a day (TID) | INTRAMUSCULAR | Status: DC

## 2023-11-13 MED ORDER — METOPROLOL TARTRATE 50 MG PO TABS: 50.0000 mg | ORAL_TABLET | Freq: Three times a day (TID) | ORAL | Status: DC

## 2023-11-13 MED ORDER — PANTOPRAZOLE SODIUM 40 MG IV SOLR
40.0000 mg | Freq: Every day | INTRAVENOUS | Status: DC
  Administered 2023-11-13 – 2023-11-14 (×2): 40 mg via INTRAVENOUS
  Filled 2023-11-13 (×2): qty 10

## 2023-11-13 MED ORDER — BANATROL TF EN LIQD
60.0000 mL | Freq: Two times a day (BID) | ENTERAL | Status: DC
  Administered 2023-11-13 – 2023-11-15 (×3): 60 mL
  Filled 2023-11-13 (×3): qty 60

## 2023-11-13 MED ORDER — ALPRAZOLAM 0.25 MG PO TABS
0.2500 mg | ORAL_TABLET | Freq: Two times a day (BID) | ORAL | Status: DC | PRN
  Administered 2023-11-13 – 2023-11-14 (×4): 0.25 mg via ORAL
  Filled 2023-11-13 (×5): qty 1

## 2023-11-13 MED ORDER — INSULIN ASPART 100 UNIT/ML IJ SOLN: 0.0000 [IU] | INTRAMUSCULAR | Status: DC

## 2023-11-13 MED ORDER — ASPIRIN 81 MG PO CHEW: 81.0000 mg | CHEWABLE_TABLET | Freq: Every day | ORAL | Status: DC

## 2023-11-13 MED ORDER — INSULIN ASPART 100 UNIT/ML IJ SOLN: 3.0000 [IU] | INTRAMUSCULAR | Status: DC

## 2023-11-13 MED ORDER — METOPROLOL TARTRATE 50 MG PO TABS
50.0000 mg | ORAL_TABLET | Freq: Three times a day (TID) | ORAL | Status: DC
  Administered 2023-11-13 – 2023-11-14 (×2): 50 mg
  Filled 2023-11-13 (×3): qty 1

## 2023-11-13 MED ORDER — INSULIN GLARGINE-YFGN 100 UNIT/ML ~~LOC~~ SOLN
15.0000 [IU] | Freq: Every day | SUBCUTANEOUS | Status: DC
  Administered 2023-11-13 – 2023-11-15 (×3): 15 [IU] via SUBCUTANEOUS
  Filled 2023-11-13 (×3): qty 0.15

## 2023-11-13 MED ORDER — PREDNISONE 10 MG PO TABS
10.0000 mg | ORAL_TABLET | Freq: Every day | ORAL | Status: DC
  Administered 2023-11-14 – 2023-11-15 (×2): 10 mg via ORAL
  Filled 2023-11-13 (×2): qty 1

## 2023-11-13 MED ORDER — LEVETIRACETAM 500 MG PO TABS
1000.0000 mg | ORAL_TABLET | Freq: Two times a day (BID) | ORAL | Status: DC
  Administered 2023-11-13: 1000 mg
  Filled 2023-11-13: qty 2

## 2023-11-13 MED ORDER — METOPROLOL TARTRATE 50 MG PO TABS
50.0000 mg | ORAL_TABLET | Freq: Three times a day (TID) | ORAL | Status: DC
  Administered 2023-11-13: 50 mg
  Filled 2023-11-13: qty 1

## 2023-11-13 MED ORDER — TRAMADOL HCL 50 MG PO TABS
50.0000 mg | ORAL_TABLET | Freq: Two times a day (BID) | ORAL | Status: DC | PRN
  Administered 2023-11-13: 50 mg via NASOGASTRIC
  Filled 2023-11-13: qty 1

## 2023-11-13 MED ORDER — OSMOLITE 1.5 CAL PO LIQD
1000.0000 mL | ORAL | Status: DC
  Filled 2023-11-13: qty 1000

## 2023-11-13 MED ORDER — EZETIMIBE 10 MG PO TABS: 10.0000 mg | ORAL_TABLET | Freq: Every day | ORAL | Status: DC

## 2023-11-13 MED ORDER — FREE WATER: 200.0000 mL | Freq: Three times a day (TID) | Status: DC

## 2023-11-13 MED ORDER — AMIODARONE HCL 200 MG PO TABS: 200.0000 mg | ORAL_TABLET | Freq: Every day | ORAL | Status: DC

## 2023-11-13 MED ORDER — CHOLECALCIFEROL 25 MCG (1000 UT) PO CHEW: 1000.0000 [IU] | CHEWABLE_TABLET | Freq: Every day | ORAL | Status: DC

## 2023-11-13 MED ORDER — THIAMINE MONONITRATE 100 MG PO TABS: 100.0000 mg | ORAL_TABLET | Freq: Every day | ORAL | Status: DC

## 2023-11-13 NOTE — Progress Notes (Signed)
 Spoke with Aleen Huron RN at 5:38am, patient in SVT with HR 160s, SBP 90s. Immediately paged [facility] and Cardiology. Spoke with Dr. Jolan Natal of cardiology at 6:01am who went to see her immediately. Received a call from [employer] for [facility] at 6:01 as well.  Around 6:20am Dr. Jolan Natal called back stating patient had converted to NSR, he will be making adjustments to meds.  Dr. Ishmael Marcus of [facility] returned my page around 6:25am and informed me that the oncoming provider would be the one to call me back about the admission.   Awaiting further calls regarding her need for admission given she is on a floor without central cardiac monitoring

## 2023-11-13 NOTE — Consult Note (Signed)
 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     History of Present Illness:   CT scan on admission with TBI, traumatic subdural hematoma, subarachnoid hemorrhage, and right temporal contusion along with small acute infarcts in the bilateral cerebral hemispheres.  She developed expressive aphasia and dysphagia.  She was initially intubated and then extubated on 11/04/23.  Cardiology was consulted for SVT which terminated with ADO 6 mg IV. She was started on amio and metoprolol  on 11/07/23. It was thought that flecainide would be less ideal with LVH (IVS thickness borderline at 1.4 cm).   She was transferred to 4W for continued PT/OT.  Unfortunately she developed recurrent SVT on 06/01 AM during VS checks. She was not on telemetry since she was on the rehab floor. sBP lower than normal in the 90s (usually 110-130s) but otherwise no changes to baseline mental status per nursing.   During my evaluation she had converted to sinus tach at 113 from SVT at 168.   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   GERD  (gastroesophageal reflux disease)    on meds   Hiatal hernia    Hx of adenomatous polyp of colon 07/07/2009   Hyperlipidemia    diet controlled   Hypertension    on meds - LASIX    IBS (irritable bowel syndrome)    Iron  deficiency anemia    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 Willy Harvest, adenimatous polyps, repeat in 5 yrs   ESOPHAGOGASTRODUODENOSCOPY  07/06/2021   per Dr. Willy Harvest, showed portal hypertensive gastropathy, repeat in 2 yrs   ESOPHAGOGASTRODUODENOSCOPY (EGD) WITH PROPOFOL  N/A 06/20/2017   Procedure: ESOPHAGOGASTRODUODENOSCOPY (EGD) WITH PROPOFOL ;  Surgeon: Kenney Peacemaker, MD;  Location: [facility];  Service: Endoscopy;  Laterality: N/A;  with Barrx   EYE SURGERY     IR IVC FILTER PLMT / S&I /IMG GUID/MOD SED  10/31/2023   NASAL SEPTOPLASTY W/ TURBINOPLASTY     POLYPECTOMY  2015   CG-TA   TONSILLECTOMY      Home Medications:  Prior to Admission medications   Medication Sig Start Date End Date Taking?  Authorizing Provider  AMBULATORY NON FORMULARY MEDICATION ACTIVE LIVER Artichoke leaf extract, Milk thistle, Turmeric Take 1 capsule by mouth once daily    [provider]  amiodarone  (PACERONE ) 200 MG tablet Place 1 tablet (200 mg total) into feeding tube daily. 11/11/23   Elgergawy, Ardia Kraft, MD  aspirin  81 MG chewable tablet Place 1 tablet (81 mg total) into feeding tube daily. 11/11/23   Elgergawy, Ardia Kraft, MD  Cholecalciferol  25 MCG (1000 UT) CHEW Chew 1 tablet (1,000 Units total) by mouth daily. 11/11/23   Elgergawy, Ardia Kraft, MD  Cyanocobalamin  500 MCG CHEW Chew 500 mcg by mouth daily.     [provider]  ezetimibe  (ZETIA ) 10 MG tablet Place 1 tablet (10 mg total) into feeding tube daily. TAKE 1 TABLET(10 MG) BY MOUTH DAILY 11/11/23   Elgergawy, Dawood S, MD  fiber supplement, BANATROL TF, liquid Place 60 mLs into feeding tube 2 (two) times daily. 11/11/23   Elgergawy, Ardia Kraft, MD  insulin  aspart (NOVOLOG ) 100 UNIT/ML injection Inject 3 Units into the skin every 4 (four) hours. 11/11/23   Elgergawy, Ardia Kraft, MD  insulin  aspart (NOVOLOG ) 100 UNIT/ML injection Inject 0-15 Units into the skin every 4 (four) hours. 11/11/23   Elgergawy, Ardia Kraft, MD  insulin  glargine-yfgn (SEMGLEE ) 100 UNIT/ML injection Inject 0.15 mLs (15 Units total) into the skin daily. 11/11/23   Elgergawy, Ardia Kraft, MD  levETIRAcetam  (KEPPRA ) 1000 MG tablet Place 1 tablet (1,000 mg total) into feeding tube 2 (two) times daily. 11/11/23   Elgergawy, Ardia Kraft, MD  metoprolol  tartrate (LOPRESSOR ) 25 MG tablet Place 1 tablet (25 mg total) into feeding tube every 8 (eight) hours. 11/11/23   Elgergawy, Ardia Kraft, MD  Multiple Vitamin (MULTIVITAMIN PO) Take by mouth.    [provider]  naloxone [facility]) nasal spray 4 mg/0.1 mL Spray the contents of 1 device into 1 nostril. Call 911. May repeat with 2nd device in alternate nostril if no response in 2-3 minutes. 06/12/23   [provider]  Nutritional Supplements (FEEDING SUPPLEMENT, OSMOLITE 1.5 CAL,) LIQD Place 1,000 mLs into feeding tube continuous. 11/11/23   Elgergawy, Ardia Kraft, MD  pantoprazole  (PROTONIX ) 40 MG injection Inject 40 mg into the vein at bedtime. 11/11/23   Elgergawy, Ardia Kraft, MD  predniSONE  (DELTASONE ) 10 MG tablet Take 10 mg by mouth daily with breakfast.    [provider]  thiamine  (VITAMIN B-1) 100 MG tablet Place 1 tablet (100 mg total) into feeding tube daily. 11/11/23   Elgergawy, Ardia Kraft, MD  traMADol  (ULTRAM ) 50 MG tablet 1 tablet (50 mg total) by Per NG tube route every 12 (twelve) hours as needed for moderate pain  (pain score 4-6). 11/11/23   Elgergawy, Ardia Kraft, MD  Water  For Irrigation, Sterile (FREE WATER ) SOLN Place 200 mLs into feeding tube every 8 (eight) hours. 11/11/23   Elgergawy, Ardia Kraft, MD   Inpatient Medications: Scheduled Meds:  amiodarone   200 mg Per Tube Daily   aspirin   81 mg Per Tube Daily   Or   aspirin   300 mg Rectal Daily   busPIRone  5 mg Oral TID   cyanocobalamin   500 mcg Per Tube Daily   ezetimibe   10 mg Per Tube Daily   feeding supplement (PROSource TF20)  60 mL Per Tube BID   fiber supplement (BANATROL TF)  60 mL Per Tube BID   free water   200 mL Per Tube Q8H   Gerhardt's butt cream   Topical TID  insulin  aspart  0-15 Units Subcutaneous Q4H   insulin  aspart  3 Units Subcutaneous Q4H   insulin  glargine-yfgn  15 Units Subcutaneous Daily   levETIRAcetam   1,000 mg Per Tube BID   metoprolol  tartrate  50 mg Per Tube Q8H   multivitamin with minerals  1 tablet Per Tube Daily   mouth rinse  15 mL Mouth Rinse 4 times per day   pantoprazole  (PROTONIX ) IV  40 mg Intravenous QHS   predniSONE   10 mg Per Tube Q breakfast   thiamine   100 mg Per Tube Daily   Continuous Infusions:  feeding supplement (OSMOLITE 1.5 CAL) 1,000 mL (11/12/23 1307)   PRN Meds: acetaminophen , ALPRAZolam , alum & mag hydroxide-simeth, bisacodyl , camphor-menthol , guaiFENesin -dextromethorphan, hydrALAZINE , hydrOXYzine, loperamide  HCl, mouth rinse, prochlorperazine  **OR** prochlorperazine , sodium phosphate , traMADol   Allergies:    Allergies  Allergen Reactions   Fentanyl  Anxiety    Made her feel very anxious, requests not to get it.   Amoxicillin  Diarrhea   Augmentin  [Amoxicillin -Pot Clavulanate] Diarrhea   Statins Other (See Comments)    Myalgia   Farxiga  [Dapagliflozin ] Diarrhea and Other (See Comments)    GI pain    Levaquin [Levofloxacin] Anxiety    Weird dreams   Social History:   Social History   Socioeconomic History   Marital status: Married    Spouse name: Not on file   Number of  children: 0   Years of education: Not on file   Highest education level: Associate degree: occupational, Scientist, product/process development, or vocational program  Occupational History   Occupation: Therapist, nutritional    Comment: [employer]  Tobacco Use   Smoking status: Never   Smokeless tobacco: Never  Vaping Use   Vaping status: Never Used  Substance and Sexual Activity   Alcohol use: Yes    Comment: champagne on weekends   Drug use: No   Sexual activity: Yes    Partners: Male    Birth control/protection: Post-menopausal  Other Topics Concern   Not on file  Social History Narrative   Divorced, no children   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 [employer]   Social Drivers of Corporate investment banker Strain: Low Risk  (04/21/2023)   Overall Financial Resource Strain (CARDIA)    Difficulty of Paying Living Expenses: Not hard at all  Food Insecurity: No Food Insecurity (04/21/2023)   Hunger Vital Sign    Worried About Running Out of Food in the Last Year: Never true    Ran Out of Food in the Last Year: Never true  Transportation Needs: No Transportation Needs (04/21/2023)   PRAPARE - Administrator, Civil Service (Medical): No    Lack of Transportation (Non-Medical): No  Physical Activity: Insufficiently Active (04/21/2023)   Exercise Vital Sign    Days of Exercise per Week: 4 days    Minutes of Exercise per Session: 20 min  Stress: Stress Concern Present (04/21/2023)   [employer] of Occupational Health - Occupational Stress Questionnaire    Feeling of Stress : To some extent  Social Connections: Moderately Integrated (04/21/2023)   Social Connection and Isolation Panel [NHANES]    Frequency of Communication with Friends and Family: Twice a week    Frequency of Social Gatherings with Friends and Family: Once a week    Attends Religious Services: 1 to 4 times per year    Active Member of [employer] or Organizations: No    Attends Banker  Meetings: Not on file    Marital  Status: Married  Catering manager Violence: Not on file    Family History:    Family History  Problem Relation Age of Onset   Stroke Mother    Hypertension Mother    Colon cancer Father 47   Liver cancer Father 32   Colon polyps Father 45   Heart disease Sister        hole in heart   Pancreatic cancer Maternal Aunt    Brain cancer Maternal Uncle    Stroke Maternal Grandmother    Hypertension Maternal Grandmother    Esophageal cancer Maternal Grandfather    Throat cancer Maternal Grandfather    Breast cancer Paternal Grandmother    Brain cancer Paternal Grandmother    Lung cancer Paternal Grandfather    Rectal cancer Neg Hx    Stomach cancer Neg Hx     ROS:  Review of Systems: [y] = yes, [ ]  = no      General: Weight gain [ ] ; Weight loss [ ] ; Anorexia [ ] ; Fatigue [ ] ; Fever [ ] ; Chills [ ] ; Weakness [ ]    Cardiac: Chest pain/pressure [ ] ; Resting SOB [ ] ; Exertional SOB [ ] ; Orthopnea [ ] ; Pedal Edema [ ] ; Palpitations [y]; Syncope [ ] ; Presyncope [ ] ; Paroxysmal nocturnal dyspnea [ ]    Pulmonary: Cough [ ] ; Wheezing [ ] ; Hemoptysis [ ] ; Sputum [ ] ; Snoring [ ]    GI: Vomiting [ ] ; Dysphagia [ ] ; Melena [ ] ; Hematochezia [ ] ; Heartburn [ ] ; Abdominal pain [ ] ; Constipation [ ] ; Diarrhea [ ] ; BRBPR [ ]    GU: Hematuria [ ] ; Dysuria [ ] ; Nocturia [ ]  Vascular: Pain in legs with walking [ ] ; Pain in feet with lying flat [ ] ; Non-healing sores [ ] ; Stroke [ ] ; TIA [ ] ; Slurred speech [ ] ;   Neuro: Headaches [ ] ; Vertigo [ ] ; Seizures [ ] ; Paresthesias [ ] ;Blurred vision [ ] ; Diplopia [ ] ; Vision changes [ ]    Ortho/Skin: Arthritis [ ] ; Joint pain [ ] ; Muscle pain [ ] ; Joint swelling [ ] ; Back Pain [ ] ; Rash [ ]    Psych: Depression [ ] ; Anxiety [ ]    Heme: Bleeding problems [ ] ; Clotting disorders [ ] ; Anemia [ ]    Endocrine: Diabetes [ ] ; Thyroid  dysfunction [ ]    Physical Exam/Data:   Vitals:   11/13/23 0600 11/13/23 0610 11/13/23 0627  11/13/23 0712  BP: 111/77 (!) 131/55 (!) 133/98 (!) 105/52  Pulse: (!) 112 (!) 113 (!) 113 (!) 105  Resp: 20 20 20 20   Temp:   98.2 F (36.8 C) 98 F (36.7 C)  TempSrc:    Oral  SpO2:   97% 97%  Weight:      Height:        Intake/Output Summary (Last 24 hours) at 11/13/2023 0747 Last data filed at 11/12/2023 1756 Gross per 24 hour  Intake 150 ml  Output --  Net 150 ml      11/11/2023    4:33 PM 11/11/2023    5:00 AM 11/10/2023    4:24 AM  Last 3 Weights  Weight (lbs) 112 lb 3.4 oz 119 lb 7.8 oz 119 lb 14.9 oz  Weight (kg) 50.9 kg 54.2 kg 54.4 kg     Body mass index is 19.26 kg/m.  General: Frail elderly female laying in bed Cardiac: AS murmur  Pulmonary: lung clear Multiple bruises /skin lesions on legs No edema  EKG:  The EKGs were personally reviewed and  demonstrated:   11/13/23 (06:11:25): ST 113, PR 128, QRS 74, QT/c 350/480  11/13/23 (05:26:01): SVT/VR 168, QRS 90, QT/c 276/461, pseudo R' V1   Laboratory Data:  Chemistry Recent Labs  Lab 11/11/23 0513 11/12/23 0731 11/13/23 0638  NA 138 137 135  K 3.1* 3.8 4.1  CL 106 106 102  CO2 21* 19* 24  GLUCOSE 165* 160* 178*  BUN 14 15 20   CREATININE 0.50 0.50 0.52  CALCIUM  8.6* 8.4* 8.5*  GFRNONAA >60 >60 >60  ANIONGAP 11 12 9     Recent Labs  Lab 11/10/23 0556 11/11/23 0513 11/12/23 0731  PROT 6.9 7.3 7.0  ALBUMIN 2.7* 3.0* 2.9*  AST 57* 55* 52*  ALT 54* 49* 45*  ALKPHOS 248* 238* 231*  BILITOT 0.8 0.6 0.8   Hematology Recent Labs  Lab 11/11/23 0513 11/12/23 0731 11/13/23 0638  WBC 6.9 7.6 7.5  RBC 3.50* 3.55* 3.55*  HGB 11.2* 11.1* 11.2*  HCT 35.4* 35.7* 34.7*  MCV 101.1* 100.6* 97.7  MCH 32.0 31.3 31.5  MCHC 31.6 31.1 32.3  RDW 14.8 14.8 15.0  PLT 292 370 344   BNP Recent Labs  Lab 11/09/23 0437 11/10/23 0556 11/11/23 0513  BNP 129.4* 84.6 62.0    DDimer No results for input(s): "DDIMER" in the last 168 hours.  Radiology/Studies:  DG Swallowing Func-Speech Pathology Result  Date: 11/10/2023 Table formatting from the original result was not included. Modified Barium Swallow Study Patient Details Name: Miranda Cochran MRN: [id number] Date of Birth: [birth year] Today's Date: 11/10/2023 HPI/PMH: HPI: Pt is a 73 y.o. female presenting after fall 5/14. CT with occipital skull fracture, traumatic subarachnoid hemorrhage and right subdural hematoma. Repeat CT increased size of R subdural hematoma. Intubated 5/15-5/23 secondary to respiratory failure secondary to E. coli, Streptococcus HAP. EEG 5/16 with cortical dysfunction arising from R hemisphere, moderate-severe diffuse encephalopathy. EEG 5/17 with seizure activity averaging 4 seizures per hour lasting 1 min each; resolved with adjustment to medications. RLE DVT 5/19 and IVC filter placed. PMH significant for autoimmune hepatitis with cirrhosis of liver, HTN, depression, GERD, HLD, DM, IBS, osteoporosis, thyroid  nodule, vascular disease, vitamin D deficiency. Clinical Impression: Clinical Impression: Pt demonstrated a DIGEST score of 2 indicating moderate oropharyngeal dysphagia. Pt had excessive movements, decreased sustained attention and needed cueing throughout study. There was decreased labial closure with anterior spill, trace lingual residue and slow mastication of solid in part due to decreased attention. There was incomplete glottal closure, partial anterior hyoid excursion and partial epiglottic inversion resulting in penetration with thin to the cords that pt sensed and cleared throat to clear penetrates. Chin tuck strategy was ineffective. Nectar thick penetrated to cords with minimal remaining in vestibule. Laryngeal penetration with honey thick to vocal cords that pt sensed and spontaneous throat clear cleared vestibule. Pharyngeal residue was mild in valleculae and pyriform sinuses. Recommend puree texture, honey thick liquids, intermittent throat clear, crush pills and full assist/supervision with po's. DIGEST Swallow Severity  Rating*  Safety:  Efficiency:  Overall Pharyngeal Swallow Severity: 1: mild; 2: moderate; 3: severe; 4: profound *The Dynamic Imaging Grade of Swallowing Toxicity is standardized for the head and neck cancer population, however, demonstrates promising clinical applications across populations to standardize the clinical rating of pharyngeal swallow safety and severity. Factors that may increase risk of adverse event in presence of aspiration Roderick Civatte & Jessy Morocco 2021): Factors that may increase risk of adverse event in presence of aspiration Roderick Civatte & Jessy Morocco 2021): Reduced cognitive function Recommendations/Plan: Swallowing Evaluation Recommendations Swallowing  Evaluation Recommendations Recommendations: PO diet PO Diet Recommendation: Moderately thick liquids (Level 3, honey thick); Dysphagia 1 (Pureed) Liquid Administration via: Cup Medication Administration: Crushed with puree Supervision: Staff to assist with self-feeding; Full supervision/cueing for swallowing strategies Swallowing strategies  : Slow rate; Small bites/sips; Clear throat intermittently Postural changes: Position pt fully upright for meals Oral care recommendations: Oral care BID (2x/day) Treatment Plan Treatment Plan Treatment recommendations: Therapy as outlined in treatment plan below Follow-up recommendations: Acute inpatient rehab (3 hours/day) Functional status assessment: Patient has had a recent decline in their functional status and demonstrates the ability to make significant improvements in function in a reasonable and predictable amount of time. Treatment frequency: Min 2x/week Treatment duration: 2 weeks Interventions: Compensatory techniques; Patient/family education; Trials of upgraded texture/liquids; Diet toleration management by SLP Recommendations Recommendations for follow up therapy are one component of a multi-disciplinary discharge planning process, led by the attending physician.  Recommendations may be updated based on  patient status, additional functional criteria and insurance authorization. Assessment: Orofacial Exam: Orofacial Exam Oral Cavity - Dentition: Adequate natural dentition Orofacial Anatomy: WFL Oral Motor/Sensory Function: WFL Anatomy: Anatomy: WFL Boluses Administered: Boluses Administered Boluses Administered: Thin liquids (Level 0); Mildly thick liquids (Level 2, nectar thick); Moderately thick liquids (Level 3, honey thick); Puree; Solid  Oral Impairment Domain: Oral Impairment Domain Lip Closure: Escape progressing to mid-chin Tongue control during bolus hold: Cohesive bolus between tongue to palatal seal Bolus preparation/mastication: Slow prolonged chewing/mashing with complete recollection Bolus transport/lingual motion: Brisk tongue motion Oral residue: Trace residue lining oral structures Location of oral residue : Tongue Initiation of pharyngeal swallow : Pyriform sinuses  Pharyngeal Impairment Domain: Pharyngeal Impairment Domain Soft palate elevation: No bolus between soft palate (SP)/pharyngeal wall (PW) Laryngeal elevation: Complete superior movement of thyroid  cartilage with complete approximation of arytenoids to epiglottic petiole Anterior hyoid excursion: Partial anterior movement Epiglottic movement: Partial inversion Laryngeal vestibule closure: Incomplete, narrow column air/contrast in laryngeal vestibule Pharyngeal stripping wave : Present - complete Pharyngeal contraction (A/P view only): N/A Pharyngoesophageal segment opening: Complete distension and complete duration, no obstruction of flow Tongue base retraction: No contrast between tongue base and posterior pharyngeal wall (PPW) Pharyngeal residue: Collection of residue within or on pharyngeal structures Location of pharyngeal residue: Valleculae; Pyriform sinuses  Esophageal Impairment Domain: Esophageal Impairment Domain Esophageal clearance upright position: Esophageal retention Pill: No data recorded Penetration/Aspiration Scale  Score: Penetration/Aspiration Scale Score 1.  Material does not enter airway: Solid; Puree 2.  Material enters airway, remains ABOVE vocal cords then ejected out: Moderately thick liquids (Level 3, honey thick) 3.  Material enters airway, remains ABOVE vocal cords and not ejected out: Moderately thick liquids (Level 3, honey thick) 4.  Material enters airway, CONTACTS cords then ejected out: Moderately thick liquids (Level 3, honey thick) Compensatory Strategies: Compensatory Strategies Compensatory strategies: Yes Chin tuck: Ineffective Ineffective Chin Tuck: Thin liquid (Level 0)   General Information: Caregiver present: No  Diet Prior to this Study: NPO; Cortrak/Small bore NG tube   Temperature : Normal   Respiratory Status: WFL   Supplemental O2: None (Room air)   History of Recent Intubation: Yes  Behavior/Cognition: Alert; Cooperative; Confused; Distractible; Requires cueing Self-Feeding Abilities: Needs assist with self-feeding Baseline vocal quality/speech: Normal No data recorded No data recorded Exam Limitations: Excessive movement Goal Planning: Prognosis for improved oropharyngeal function: Good Barriers to Reach Goals: Cognitive deficits No data recorded No data recorded Consulted and agree with results and recommendations: Patient; Nurse Pain: Pain Assessment Pain Assessment: Faces  Faces Pain Scale: 0 Breathing: 0 Negative Vocalization: 0 Facial Expression: 0 Body Language: 0 Consolability: 0 PAINAD Score: 0 Facial Expression: 0 Body Movements: 0 Muscle Tension: 0 Compliance with ventilator (intubated pts.): N/A Vocalization (extubated pts.): 0 CPOT Total: 0 Pain Location: generalized, back, sacral sores Pain Descriptors / Indicators: Discomfort; Grimacing Pain Intervention(s): Monitored during session End of Session: Start Time:SLP Start Time (ACUTE ONLY): 1418 Stop Time: SLP Stop Time (ACUTE ONLY): 1440 Time Calculation:SLP Time Calculation (min) (ACUTE ONLY): 22 min Charges: SLP Evaluations $ SLP  Speech Visit: 1 Visit SLP Evaluations $MBS Swallow: 1 Procedure $Swallowing Treatment: 1 Procedure SLP visit diagnosis: SLP Visit Diagnosis: Dysphagia, oropharyngeal phase (R13.12) Past Medical History: Past Medical History: Diagnosis Date  Abnormal Pap smear of cervix   05-15-21 ascus hpv hr+  Allergy   Anxiety   on meds  Autoimmune hepatitis (HCC) 08/18/2016  08/2016 liver bx confirms, fibrosis not cirrhosis  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 08/29/2020  COVID 06/27/2020  Depression   on meds  DM (diabetes mellitus) (HCC)   Fracture, fibula   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  GERD (gastroesophageal reflux disease)   on meds  Hiatal hernia   Hx of adenomatous polyp of colon 07/07/2009  Hyperlipidemia   diet controlled  Hypertension   on meds - LASIX   IBS (irritable bowel syndrome)   Iron  deficiency anemia   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 Willy Harvest, adenimatous polyps, repeat in 5 yrs  ESOPHAGOGASTRODUODENOSCOPY  07/06/2021  per Dr. 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 She is on amio 200 mg daily and metop tartrate 25 gm q8h. With her current prognosis would not offer any invasive tx such as ablation. Increase metop tartate to 50 mg q8h. If adequately controlled can consolidate to toprol  XL for ease of dosing. If not well controlled can further uptitrate and/or increase amio to 400 mg daily. Only ~ 1 gm of amio in (200 mg daily since 11/07/23) from what I can see in the notes.    For questions or updates, please contact [facility] Please consult [email or web address] for contact info under   Signed, Katrine Parody, MD  11/13/2023 7:47 AM

## 2023-11-13 NOTE — Progress Notes (Signed)
 Paged that patient has recurrent SVT, on 4W13 in rehab so not on telemetry, found on VS check. Unclear how long in it. PT preference for re-admission and monitoring to see if she has more SVT before transferring back to rehab. Increase metop tartrate to 50 mg q8h via tube from 25 mg q8h via tube. Continue amio 200 mg daily.

## 2023-11-13 NOTE — Evaluation (Signed)
 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   GERD (gastroesophageal reflux disease)    on meds   Hiatal hernia    Hx of adenomatous polyp of colon 07/07/2009   Hyperlipidemia    diet controlled   Hypertension    on meds - LASIX    IBS (irritable bowel syndrome)    Iron  deficiency anemia    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 Willy Harvest, adenimatous polyps, repeat in 5 yrs   ESOPHAGOGASTRODUODENOSCOPY  07/06/2021   per Dr. Willy Harvest, showed portal hypertensive gastropathy, repeat in 2 yrs   ESOPHAGOGASTRODUODENOSCOPY (EGD) WITH PROPOFOL  N/A 06/20/2017   Procedure: ESOPHAGOGASTRODUODENOSCOPY (EGD) WITH PROPOFOL ;  Surgeon: Kenney Peacemaker, MD;  Location: [facility];  Service: Endoscopy;  Laterality: N/A;  with Barrx   EYE SURGERY     IR IVC FILTER PLMT / S&I /IMG GUID/MOD SED  10/31/2023   NASAL  SEPTOPLASTY W/ TURBINOPLASTY     POLYPECTOMY  2015   CG-TA   TONSILLECTOMY     HPI:  Pt is a 73 y.o. female presenting after fall 5/14. CT with occipital skull fracture, traumatic subarachnoid hemorrhage and right subdural hematoma. Repeat CT increased size of R subdural hematoma. Intubated 5/15-5/23 secondary to respiratory failure secondary to E. coli, Streptococcus HAP. EEG 5/16 with cortical dysfunction arising from R hemisphere, moderate-severe diffuse encephalopathy. EEG 5/17 with seizure activity averaging 4 seizures per hour lasting 1 min each; resolved with adjustment to medications. RLE DVT 5/19 and IVC filter placed. PMH significant for autoimmune hepatitis with cirrhosis of liver, HTN, depression, GERD, HLD, DM, IBS, osteoporosis, thyroid  nodule, vascular disease, vitamin D deficiency. BSE followed by MBS completed on 5/29 reported moderate oropharyngeal dysphagia and recommended Dys 1, honey thick liquids. Patient discharged to [facility] and evaluated by SLP on 5/31 but had runs of SVT c/b hypotension overnight on 5/31 (discovered during VS rounds); thus, she was readmitted to medicine for closer monitoring.    Assessment / Plan / Recommendation  Clinical Impression  Patient presents with clinical s/s of dysphagia as per this BSE with alertness and cognition continuing to be impacting her overall ability to safely efficiently consume PO's. She has Cortrak in place but it is not connected and per spouse, "they stopped the tube feedings".  After significant delay in achieving adequate arousal, patient able to attend enough for PO intake. She exhibited oral delays and holding of PO's with purees and suspected swallow initiation delay with puree and honey thick liquids via spoon but no overt s/s aspiration. SLP recommending continue with current diet of Dys 1 (puree), honey thick liquids with full supervision and careful feeding only when she is adequately alert. SLP will follow. SLP Visit Diagnosis:  Dysphagia, oropharyngeal phase (R13.12)    Aspiration Risk  Moderate aspiration risk;Risk for inadequate nutrition/hydration    Diet Recommendation Dysphagia 1 (Puree);Honey-thick liquid    Liquid Administration via: Spoon;Cup Medication Administration: Crushed with puree Supervision: Full supervision/cueing for compensatory strategies;Staff to assist with self feeding Compensations: Slow rate;Small sips/bites;Clear throat intermittently Postural Changes: Seated upright at 90 degrees    Other  Recommendations Oral Care Recommendations: Oral care QID;Oral care before and after PO    Recommendations for follow up therapy are one component of a multi-disciplinary discharge planning process, led by the attending physician.  Recommendations may be updated based on patient status, additional functional criteria and insurance authorization.  Follow up Recommendations Acute inpatient rehab (3hours/day)      Assistance Recommended at Discharge    Functional Status Assessment Patient has had a recent decline in their functional status and demonstrates the ability to make significant improvements in function in a reasonable and predictable amount of time.  Frequency and Duration min 2x/week          Prognosis Prognosis for improved oropharyngeal function: Good Barriers to Reach Goals: Cognitive deficits;Severity of deficits      Swallow Study   General Date of Onset: 11/13/23 HPI: Pt is a 73 y.o. female presenting after fall 5/14. CT with occipital skull fracture, traumatic subarachnoid hemorrhage and right subdural hematoma. Repeat CT increased size of R subdural hematoma. Intubated 5/15-5/23 secondary to respiratory failure secondary to E. coli, Streptococcus HAP. EEG 5/16 with cortical dysfunction arising from R hemisphere, moderate-severe diffuse encephalopathy. EEG 5/17 with seizure activity averaging 4 seizures per hour lasting 1 min each; resolved with adjustment to medications. RLE  DVT 5/19 and IVC filter placed. PMH significant for autoimmune hepatitis with cirrhosis of liver, HTN, depression, GERD, HLD, DM, IBS, osteoporosis, thyroid  nodule, vascular disease, vitamin D deficiency. BSE followed by MBS completed on 5/29 reported moderate oropharyngeal dysphagia and recommended Dys 1, honey thick liquids. Patient discharged to [facility] and evaluated by SLP on 5/31 but had runs of SVT c/b hypotension overnight on 5/31 (discovered during VS rounds); thus, she was readmitted to medicine for closer monitoring. Type of Study: Bedside Swallow Evaluation Previous Swallow Assessment: 5/29 MBS Diet Prior to this Study: Dysphagia 1 (pureed);Moderately thick liquids (Level 3, honey thick) Temperature Spikes Noted: No Respiratory Status: Room air History of Recent Intubation: Yes Total duration of intubation (days): 9 days Date extubated: 11/04/23 Behavior/Cognition: Alert;Pleasant mood;Confused;Requires cueing Oral Cavity Assessment: Within Functional Limits Oral Care Completed by SLP: No Oral Cavity - Dentition: Adequate natural dentition Self-Feeding Abilities: Total assist Patient Positioning: Upright in bed Baseline Vocal Quality: Other (comment) (minimal verbalizations) Volitional Cough: Cognitively unable to elicit Volitional Swallow: Able to elicit    Oral/Motor/Sensory Function Overall Oral Motor/Sensory Function: Other (comment) (not following directions fully but no focal weakness observed)   Ice Chips Ice chips: Not tested   Thin Liquid Thin Liquid: Not tested    Nectar Thick     Honey Thick Honey Thick Liquid: Impaired Presentation: Spoon Oral Phase Impairments: Reduced labial seal;Poor awareness of  bolus Pharyngeal Phase Impairments: Suspected delayed Swallow   Puree Puree: Impaired Presentation: Spoon Oral Phase Impairments: Poor awareness of bolus Oral Phase Functional Implications: Prolonged oral transit;Oral holding Pharyngeal Phase Impairments: Suspected  delayed Swallow   Solid     Solid: Not tested     Jacqualine Mater, MA, CCC-SLP Speech Therapy

## 2023-11-13 NOTE — Progress Notes (Signed)
 Attempt to call transfer RN on 4E. RN busy at this time. Per Diplomatic Services operational officer, will have receiving RN call back when available for report.

## 2023-11-13 NOTE — Plan of Care (Signed)
  Problem: Consults Goal: RH BRAIN INJURY PATIENT EDUCATION Description: Description: See Patient Education module for eduction specifics Outcome: Not Progressing   Problem: RH COGNITION-NURSING Goal: RH STG USES MEMORY AIDS/STRATEGIES W/ASSIST TO PROBLEM SOLVE Description: STG Uses Memory Aids/Strategies With min Assistance to Problem Solve. Outcome: Not Progressing

## 2023-11-13 NOTE — Significant Event (Signed)
 Rapid Response Event Note   Reason for Call :  SVT-160s, SBP-90s  Initial Focused Assessment:  Pt lying in bed with eyes open. She is very restless, nods head to some questions, follows some commands. She shakes her head "no" when asked if she is have SOB/CP/dizziness.  Lungs clear t/o. Skin cool to touch, dry.   HR-168, BP-97/66, RR-18, SpO2-100% on [place] 2L.   Cardiology consulted. Prior to cards arriving, pt's HR decreased to 110s, SBP-130s. EKG obtained showing SR with PACs.   Interventions:  EKG-SVT, nonspecific ST and T wave abnormality CBC BMP Additional EKG-SR with PACs TRH/Cardiology consulted: Metoprolol  changed to 50mg  q8h via tube Tx back to acute side for closer monitoring.  Plan of Care:  HR now SR-110s, SBP-130s. Plan to tx back to acute side of hospital when bed available. Continue to monitor closely. Please call RRT if further assistance needed.   Event Summary:   MD Notified: Terrea Ferrier, Rehab PA, Dr. Carlisle(cards MD) consulted and to beside, TRH called for admission.  Call [id number] Arrival [id number] End [id number]  Dorrine Gaudy, RN

## 2023-11-13 NOTE — H&P (Signed)
 History and Physical    Patient: Miranda Cochran:[id number] DOB: 02/24/1951 DOA: 11/13/2023 DOS: the patient was seen and examined on 11/13/2023 PCP: Donley Furth, MD  Patient coming from: IP rehab  Chief Complaint: No chief complaint on file.  HPI: Miranda Cochran is a 73 y.o. female with medical history significant of "HTN, DM type II, GAVE syndrome causing GI bleed, alcohol abuse, autoimmune hepatitis with cirrhosis of liver, chronically on prednisone , GERD, hiatal hernia, and PAD presented after falling and striking her head.  Patient was admitted on 10/26/2023  for TBI. ER labs showed hypokalemia with prolonged QTc on EKG, WBC 16.5, SBP > 200. CT significant for evidence of occipital skull fracture and traumatic subarachnoid hemorrhage and right subdural hematoma.  On 5/15 patient developed AMS and tachyarrhythmia.  Dr. Bonnita Buttner consulted, CT showed TBI with traumatic increasing right subdural hematoma, SAH and right temporal contusion. MRI showed small acute infarcts in bilateral cerebral hemispheres right more than left.  PCCM consulted Dr. Ardelle Kos intubated for worsening TBI, EEG revealed seizure activity, patient received Keppra /phenobarbital .  Serial head CT's have been stable and show improvement."  Pt discharged to IP rehab on 5/30 and had runs of SVT c/b hypotension overnight on 5/31 (discovered during VS rounds); thus, she was readmitted to medicine for closer monitoring.Pt currently sitting up in bed and able to converse w/o difficulty. She is moving all of he extremities, and asking to eat food and stop her tube feeds (pt able to tol PO per husband at bedside fro 2 meals yesterday). Pt continues in SR w/ rates of 90s on my exam.   Of note, recent "hospital course significant for RLE DVT, IVC filter was placed by Dr. Nereida Banning she was not a candidate for anticoagulation given falls/TBI. Alk phos trended upwards requiring close monitoring of LFT's. CTA chest negative for PE but showed LLL  PNA.  Dr. Stann Earnest consulted for runs of SVT that later terminated with adenosine .  Lopressor  and amiodarone  initiated via tube.  TTE showed normal EF and moderate AS.  Coretrak placed 5/21 due to expressive aphasia and dysphagia SLP following.  She continues to be limited by left-sided weakness, expressive aphasia and dysphagia. She is mod assist and was independent PTA."    Review of Systems: As mentioned in the history of present illness. All other systems reviewed and are negative. 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   GERD (gastroesophageal reflux disease)    on meds   Hiatal hernia    Hx of adenomatous polyp of colon 07/07/2009   Hyperlipidemia    diet controlled   Hypertension    on meds - LASIX    IBS (irritable bowel syndrome)    Iron  deficiency anemia    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 Willy Harvest, adenimatous polyps, repeat in 5 yrs   ESOPHAGOGASTRODUODENOSCOPY  07/06/2021   per Dr. Willy Harvest, showed portal hypertensive gastropathy, repeat in 2 yrs   ESOPHAGOGASTRODUODENOSCOPY (EGD) WITH PROPOFOL  N/A 06/20/2017   Procedure: ESOPHAGOGASTRODUODENOSCOPY (EGD) WITH PROPOFOL ;  Surgeon: Kenney Peacemaker, MD;  Location: [facility];  Service: Endoscopy;  Laterality: N/A;  with Barrx   EYE SURGERY     IR IVC FILTER PLMT / S&I /IMG GUID/MOD SED  10/31/2023   NASAL SEPTOPLASTY W/ TURBINOPLASTY     POLYPECTOMY  2015   CG-TA   TONSILLECTOMY     Social History:  reports that she has never smoked. She has never used smokeless tobacco. She reports current alcohol use. She reports that she does not use drugs.  Allergies  Allergen Reactions   Fentanyl  Anxiety    Made her feel very anxious, requests not to get it.   Amoxicillin  Diarrhea   Augmentin  [Amoxicillin -Pot Clavulanate] Diarrhea   Statins Other (See Comments)    Myalgia   Farxiga  [Dapagliflozin ] Diarrhea and Other (See Comments)    GI pain    Levaquin [Levofloxacin] Anxiety    Weird dreams    Family History  Problem Relation Age of Onset   Stroke Mother    Hypertension Mother    Colon cancer Father 33   Liver cancer Father 78   Colon polyps Father 24   Heart disease Sister        hole in heart   Pancreatic cancer Maternal Aunt    Brain cancer Maternal Uncle    Stroke Maternal Grandmother    Hypertension Maternal Grandmother    Esophageal cancer Maternal Grandfather    Throat cancer Maternal Grandfather    Breast cancer Paternal Grandmother    Brain cancer Paternal Grandmother    Lung cancer Paternal Grandfather    Rectal cancer Neg Hx    Stomach cancer Neg Hx     Prior to Admission medications   Medication Sig Start Date End Date Taking? Authorizing Provider  AMBULATORY NON FORMULARY MEDICATION ACTIVE LIVER Artichoke leaf extract, Milk thistle, Turmeric Take 1 capsule by mouth once daily    [provider]  amiodarone  (PACERONE ) 200 MG tablet Place 1 tablet (200 mg total) into feeding tube daily. 11/11/23   Elgergawy, Ardia Kraft, MD  aspirin  81 MG chewable tablet Place 1 tablet (81 mg total) into feeding tube daily. 11/11/23   Elgergawy, Ardia Kraft, MD  Cholecalciferol  25 MCG (1000 UT) CHEW Chew 1 tablet  (1,000 Units total) by mouth daily. 11/11/23   Elgergawy, Ardia Kraft, MD  Cyanocobalamin  500 MCG CHEW Chew 500 mcg by mouth daily.    [provider]  ezetimibe  (ZETIA ) 10 MG tablet Place 1 tablet (10 mg total) into feeding tube daily. TAKE 1 TABLET(10 MG) BY MOUTH DAILY 11/11/23   Elgergawy, Dawood S, MD  fiber supplement, BANATROL TF, liquid Place 60 mLs into feeding tube 2 (two) times daily. 11/11/23   Elgergawy, Ardia Kraft, MD  insulin  aspart (NOVOLOG ) 100 UNIT/ML injection Inject 3 Units into the skin every 4 (four) hours. 11/11/23   Elgergawy, Ardia Kraft, MD  insulin  aspart (NOVOLOG ) 100 UNIT/ML injection Inject 0-15 Units into the skin every 4 (four) hours. 11/11/23   Elgergawy, Ardia Kraft, MD  insulin  glargine-yfgn (SEMGLEE ) 100 UNIT/ML injection Inject 0.15 mLs (15 Units total) into the skin daily. 11/11/23   Elgergawy, Ardia Kraft, MD  levETIRAcetam  (KEPPRA ) 1000 MG tablet Place 1 tablet (1,000 mg total) into feeding tube 2 (two) times daily. 11/11/23   Elgergawy, Ardia Kraft, MD  metoprolol  tartrate (LOPRESSOR ) 25 MG tablet Place 1 tablet (25 mg total) into feeding tube every 8 (eight) hours. 11/11/23   Elgergawy, Ardia Kraft, MD  Multiple Vitamin (MULTIVITAMIN PO) Take by mouth.    [provider]  naloxone [facility]) nasal spray 4 mg/0.1 mL Spray the contents of 1 device into 1 nostril. Call 911. May repeat with 2nd device in alternate nostril if no response in 2-3 minutes. 06/12/23   [provider]  Nutritional Supplements (FEEDING SUPPLEMENT, OSMOLITE 1.5 CAL,) LIQD Place 1,000 mLs into feeding tube continuous. 11/11/23   Elgergawy, Ardia Kraft, MD  pantoprazole  (PROTONIX ) 40 MG injection Inject 40 mg into the vein at bedtime. 11/11/23   Elgergawy, Ardia Kraft, MD  predniSONE  (DELTASONE ) 10 MG tablet Take 10 mg by mouth daily with breakfast.    [provider]  thiamine  (VITAMIN B-1) 100 MG tablet Place 1 tablet (100 mg total) into feeding tube daily. 11/11/23   Elgergawy, Ardia Kraft, MD   traMADol  (ULTRAM ) 50 MG tablet 1 tablet (50 mg total) by Per NG tube route every 12 (twelve) hours as needed for moderate pain (pain score 4-6). 11/11/23   Elgergawy, Ardia Kraft, MD  Water  For Irrigation, Sterile (FREE WATER ) SOLN Place 200 mLs into feeding tube every 8 (eight) hours. 11/11/23   Elgergawy, Ardia Kraft, MD    Physical Exam: Vitals:   11/13/23 1109  BP: 91/76  Temp: (!) 97.5 F (36.4 C)  TempSrc: Oral  Weight: 53.5 kg  Height: 5\' 6"  (1.676 m)   General: Alert, oriented x3, resting comfortably in no acute distress Respiratory: Lungs clear to auscultation bilaterally with normal respiratory effort; no w/r/r Cardiovascular: Regular rate and rhythm w/o m/r/g  Data Reviewed:  Lab Results  Component Value Date   WBC 7.5 11/13/2023   HGB 11.2 (L) 11/13/2023   HCT 34.7 (L) 11/13/2023   MCV 97.7 11/13/2023   PLT 344 11/13/2023   Lab Results  Component Value Date   GLUCOSE 178 (H) 11/13/2023   CALCIUM  8.5 (L) 11/13/2023   NA 135 11/13/2023   K 4.1 11/13/2023   CO2 24 11/13/2023   CL 102 11/13/2023   BUN 20 11/13/2023   CREATININE 0.52 11/13/2023   Lab Results  Component Value Date   ALT 45 (H) 11/12/2023   AST 52 (H) 11/12/2023   ALKPHOS 231 (H) 11/12/2023   BILITOT 0.8 11/12/2023   Lab Results  Component Value Date   INR 1.1 11/09/2023   INR 1.2 10/27/2023   INR 1.1 10/26/2023    Radiology: No results found.   Assessment and Plan: 89F h/o EtOH abuse, cirrhosis, GAVE with GIB, GERD, hiatal hernia, HTN, DM2, thyroid  nodule, PAD, and MDD p/w recent TBI/SDH and re-admitted for recurrent SVT.   Recurrent SVT -Cards consulted; recs: continue amiodarone  200mg  daily for now; increase metoprolol  25-->50mg  q8h; consider metoprolol  XL for ease of dosing once rate controlled; may consider amiodarone  400mg  daily for better rate control if needed  TBI/SDH -PT/OT/SLP consulted; apprec eval/recs -Continue keppra  1000mg  BID -Avoid OAC or now; defer timing of OAC to  Neurology -Regular diet ordered w/ NGT to remain in place until pt demonstrates consistent ability to tol PO  RLE DVT  S/p IVC filter on 5/19 -CTM  DM2 -Long acting insulin  15U at bedtime + SSI TID AC prn  Autoimmune hepatits/cirrhosis -PTA prednisone   Anxiety -Xanax  0.25mg  BID prn (pta 0.5mg  nightly prn)   Advance Care Planning:   Code Status: Full Code   Consults: Cards  Family Communication: N/A  Severity of Illness: The appropriate patient status for this patient is INPATIENT. Inpatient status is judged to be reasonable and necessary in order to provide the required intensity of service to ensure the patient's safety. The patient's presenting symptoms, physical exam findings, and initial radiographic and laboratory data in the context of their chronic comorbidities is felt to place them at high risk for further clinical deterioration. Furthermore, it is not anticipated that the patient will be medically stable for discharge from the hospital within 2 midnights of admission.   * I certify that at the point of admission it is my clinical judgment that the patient will require inpatient hospital care spanning beyond 2 midnights from the point of admission due to high intensity of service, high risk for further deterioration and high frequency of surveillance required.*   ------- I spent 55 minutes reviewing previous labs/notes, obtaining separate history at the bedside, counseling/discussing the treatment plan outlined above, ordering medications/tests, and performing clinical documentation.  Author: Arne Langdon, MD 11/13/2023 2:14 PM  For on call review [email or web address].

## 2023-11-13 NOTE — Progress Notes (Signed)
 Patient able to rest first part of shift until about 0100. Patient reporting back pain at that time. PRN tramadol  given and did received kpad. Patient remained restless, continuing to endorse back pain. HOB up to 30 required for tube feedings. Atarax given for restlessness/anxiety and sarna lotion applied to back for itching due to rash. At 0520 vital check HR noted to be 162. Rapid Response notified and EKG obtained. Patient alert but very groggy/more restless- pulling at blankets/lead sticker. Patient follows commands and oriented but with increased time/stimulation to answer questions. [address] PA notified and cardiology consulted. Patient rhythm converted back spontaneously. Awaiting orders for transfer to acute. Report given off to oncoming shift. Portable monitor on at bedside. Patient asleep at this time.

## 2023-11-13 NOTE — Progress Notes (Addendum)
 PROGRESS NOTE   Subjective/Complaints:  Pt went into SVT overnight, nursing called regarding pt's HR in 160s, SBP 90s. Unclear how long she was in it for. Self converted about an hour after initial call. Pt very tired now, not really able to engage in history. She nods when informed regarding admission back to hospital for monitoring.   ROS: unable to obtain d/t patient fatigue  Objective:   No results found. Recent Labs    11/12/23 0731 11/13/23 0638  WBC 7.6 7.5  HGB 11.1* 11.2*  HCT 35.7* 34.7*  PLT 370 344   Recent Labs    11/11/23 0513 11/12/23 0731  NA 138 137  K 3.1* 3.8  CL 106 106  CO2 21* 19*  GLUCOSE 165* 160*  BUN 14 15  CREATININE 0.50 0.50  CALCIUM  8.6* 8.4*    Intake/Output Summary (Last 24 hours) at 11/13/2023 0740 Last data filed at 11/12/2023 1756 Gross per 24 hour  Intake 150 ml  Output --  Net 150 ml     Pressure Injury 11/11/23 Heel Left;Posterior Stage 2 -  Partial thickness loss of dermis presenting as a shallow open injury with a red, pink wound bed without slough. corner of heel appears as ruptured fluid filled blister 1.3x1.1 The surrounding area i (Active)  11/11/23 2120  Location: Heel  Location Orientation: Left;Posterior  Staging: Stage 2 -  Partial thickness loss of dermis presenting as a shallow open injury with a red, pink wound bed without slough.  Wound Description (Comments): corner of heel appears as ruptured fluid filled blister 1.3x1.1 The surrounding area is blanchable.  Present on Admission: Yes    Physical Exam: Vital Signs Blood pressure (!) 105/52, pulse (!) 105, temperature 98 F (36.7 C), temperature source Oral, resp. rate 20, height 5\' 4"  (1.626 m), weight 50.9 kg, SpO2 97%.  Constitutional: very fatigued, doesn't engage much in exam, nods yes and no occasionally  HENT: No JVD. Neck Supple. Trachea midline. Atraumatic, normocephalic. + Core track.  Eyes:  doesn't open eyes  Cardiovascular: HR 90s during exam, systolic murmur at RUSB, no rub/gallops. No Edema. Peripheral pulses 2+.  Respiratory: CTAB. No rales, rhonchi, or wheezing. On RA.  Abdomen: + bowel sounds, normoactive. No distention or tenderness.  Skin:  Multiple bruises and abrasions noted to bilat upper/lower extremities.  Healing occipital scalp laceration.  Neuro: will respond to painful stimuli but otherwise doesn't engage much, very sleepy, nods yes and no at times.    PRIOR EXAMS: MSK:      No apparent deformity.  Able to mobilize all 4 extremities       Neurologic exam:  Cognition: AAO to person, place, time and event. Memory: Mild deficits, much improved than yesterday Insight: Good insight into current condition.  Mood: Extremely anxious. Sensation: To light touch intact in BL UEs and LEs  Reflexes: Hyporeflexic left upper and lower extremity.  2+ right upper and lower extremity.  Negative Hoffmann's, Babinski. CN: Mild left facial droop, mild left tongue deviation. Coordination: No apparent tremors. No ataxia on FTN bilaterally Spasticity: MAS 0 in all extremities.       Strength:  RUE: 4/5 SA, 4/5 EF, 4/5 EE, 4/5 WE, 4/5 FF, 4/5 FA                LUE:  4-/5 SA, 4-/5 EF, 4-/5 EE, 4-/5 WE, 4-/5 FF, 4-/5 FA                RLE: 4/5 HF, 4/5 KE, 4/5  DF, 4/5  EHL, 4/5  PF                 LLE:  4/5 HF, 4/5 KE, 1/5  DF, 1/5  EHL, 3/5  PF    Assessment/Plan: 1. Functional deficits which require 3+ hours per day of interdisciplinary therapy in a comprehensive inpatient rehab setting. Physiatrist is providing close team supervision and 24 hour management of active medical problems listed below. Physiatrist and rehab team continue to assess barriers to discharge/monitor patient progress toward functional and medical goals  Care Tool:  Bathing    Body parts bathed by patient: Right arm, Left arm, Chest, Abdomen, Face   Body parts bathed by helper: Front  perineal area, Buttocks, Right lower leg, Left lower leg     Bathing assist Assist Level: Moderate Assistance - Patient 50 - 74%     Upper Body Dressing/Undressing Upper body dressing   What is the patient wearing?: Pull over shirt    Upper body assist Assist Level: Minimal Assistance - Patient > 75%    Lower Body Dressing/Undressing Lower body dressing      What is the patient wearing?: Incontinence brief, Pants     Lower body assist Assist for lower body dressing: Maximal Assistance - Patient 25 - 49%     Toileting Toileting    Toileting assist Assist for toileting: Maximal Assistance - Patient 25 - 49%     Transfers Chair/bed transfer  Transfers assist     Chair/bed transfer assist level: Moderate Assistance - Patient 50 - 74%     Locomotion Ambulation   Ambulation assist      Assist level: 2 helpers Assistive device: Hand held assist Max distance: 12   Walk 10 feet activity   Assist     Assist level: 2 helpers Assistive device: Hand held assist   Walk 50 feet activity   Assist Walk 50 feet with 2 turns activity did not occur: Safety/medical concerns         Walk 150 feet activity   Assist Walk 150 feet activity did not occur: Safety/medical concerns         Walk 10 feet on uneven surface  activity   Assist Walk 10 feet on uneven surfaces activity did not occur: Safety/medical concerns         Wheelchair     Assist Is the patient using a wheelchair?: Yes Type of Wheelchair: Manual    Wheelchair assist level: Dependent - Patient 0%      Wheelchair 50 feet with 2 turns activity    Assist        Assist Level: Dependent - Patient 0%   Wheelchair 150 feet activity     Assist      Assist Level: Dependent - Patient 0%   Blood pressure (!) 105/52, pulse (!) 105, temperature 98 F (36.7 C), temperature source Oral, resp. rate 20, height 5\' 4"  (1.626 m), weight 50.9 kg, SpO2 97%.  Medical Problem  List and Plan: 1. Functional deficits secondary to TBI             -patient may  shower             -ELOS/Goals: 14-18 days, Min A PT./OT./SLP            -11/13/23 pt went into SVT overnight, readmitting to hospitalist Dr. Sulema Endo given lack of cardiac monitoring on our unit, hopefully after med adjustment she'll remain stable and potentially be able to return to us , appreciate their assistance   2.  Antithrombotics: On hold due to [facility]    -DVT/anticoagulation:  Mechanical:  Antiembolism stockings, knee (TED hose) Bilateral lower extremities Sequential compression devices, below knee Bilateral lower extremities Pharmaceutical: Other (comment)             -antiplatelet therapy: ASA 81 mg daily    3. Pain Management: Tramadol  and Tylenol  PRN  - 5-31: Add cold aqua thermia for back pain, multiple compression fractures.  Patient to bring in back brace from home.   4. Mood/Behavior/Sleep: LCSW to follow for evaluation and support. -Trazodone  PRN             -antipsychotic agents: N/A             -5-31: DC trazodone , resume home Xanax  0.5 mg nightly as needed.  Added BuSpar 5 mg 3 times daily and Atarax 10 mg 3 times daily as needed for anxiety.   5. Neuropsych/cognition: This patient is capable of making decisions on her own behalf.             - Bed alarm  - 5-31: Cognition much better today.  Completely appropriate and reasonable through exam. ?  Sundowning.   6. Skin/Wound Care: Routine pressure relief measures.             - Continue topical cream PRN   7. Fluids/Electrolytes/Nutrition: Monitor I/O's. CMET in a.m.  - 5/30 MBS recc Moderately thick liquids; Dysphagia 1--did not yet resume p.o. diet, will start on admission             - Continue tube feeds for now Osmolite, Prosource--will add p.o. diet in a.m., will discuss with dietary adjusting tube feeds to promote hunger - Tube maintenance, nasotracheal suction, oral care 5-31: Tolerating p.o. intakes well.  Will discuss with dietary in  a.m. switching tube feeds over to overnight only to promote hunger   8.  DM type II:  5/15 A1c= 6.8              - Continue Semglee  dose 15 units (decreased r/t hypoglycemic event on 5/30) -NovoLog  per SSI -Monitor blood sugars QID 5-31: Blood sugars elevated, but starting diet with continuous tube feeds.  Monitor with adjustments.  Recent Labs    11/12/23 2215 11/13/23 0103 11/13/23 0503  GLUCAP 162* 207* 198*     9.  Hyperlipidemia: Continue Zetia    10. BP/Heart rate-  -continue amiodarone  100 mg daily             - continue Lopressor  25 mg daily             - Cards follow-up- Zio patch at discharge  -pt back in SVT overnight, hypotensive, spoke with Dr. Jolan Natal of cardiology who saw pt, had self converted and VS stable now, he is agreeable with [facility] readmitting to hospital for monitoring to ensure she does not go back into SVT while meds are adjusted-- appreciate his assistance    11/13/2023    7:12 AM 11/13/2023    6:27 AM 11/13/2023    6:10 AM  Vitals with BMI  Systolic 105 133 782  Diastolic 52 98 55  Pulse 105 113 113    11.  Seizures: Continue Keppra  1000 mg twice daily.   12.  Autoimmune hepatitis/cirrhosis: CBC in a.m. -Continue prednisone  10 mg              - Monitor LFT (alk phos trending up)  13.  GERD: Continue Protonix    14. HTN: Hydralazine  10 mg PRN             -Strict I/O, daily weights, monitor for signs of fluid overload             -monitor ortho stat vitals  See above   15.  Hx of EtOH abuse: Negative for DTs.              - Continue thiamine  and folic acid    16.  Acute hypoxic resp failure due to HCAPs-sputum culture 5/17 + E. coli and strep pneumonia             - S/P Evan days ABX last dose 5/23             - Guaifenesin  PRN 17. Anemia: hgb stable 6/1, readmitting to hospital  I spent >26mins performing patient care related activities, including face to face time, prolonged consultation and documentation time, and overall coordination of care.    LOS: 2 days A FACE TO FACE EVALUATION WAS PERFORMED  [address] 11/13/2023, 7:40 AM

## 2023-11-14 DIAGNOSIS — I471 Supraventricular tachycardia, unspecified: Secondary | ICD-10-CM | POA: Diagnosis not present

## 2023-11-14 LAB — GLUCOSE, CAPILLARY
Glucose-Capillary: 116 mg/dL — ABNORMAL HIGH (ref 70–99)
Glucose-Capillary: 264 mg/dL — ABNORMAL HIGH (ref 70–99)
Glucose-Capillary: 127 mg/dL — ABNORMAL HIGH (ref 70–99)
Glucose-Capillary: 113 mg/dL — ABNORMAL HIGH (ref 70–99)

## 2023-11-14 MED ORDER — METOPROLOL TARTRATE 50 MG PO TABS
50.0000 mg | ORAL_TABLET | Freq: Three times a day (TID) | ORAL | Status: DC
  Administered 2023-11-14 – 2023-11-15 (×4): 50 mg via ORAL
  Filled 2023-11-14 (×4): qty 1

## 2023-11-14 MED ORDER — AMIODARONE HCL 200 MG PO TABS
200.0000 mg | ORAL_TABLET | Freq: Every day | ORAL | Status: DC
  Administered 2023-11-14 – 2023-11-15 (×2): 200 mg via ORAL
  Filled 2023-11-14 (×2): qty 1

## 2023-11-14 MED ORDER — THIAMINE MONONITRATE 100 MG PO TABS
100.0000 mg | ORAL_TABLET | Freq: Every day | ORAL | Status: DC
  Administered 2023-11-14 – 2023-11-15 (×2): 100 mg via ORAL
  Filled 2023-11-14 (×2): qty 1

## 2023-11-14 MED ORDER — LORAZEPAM 2 MG/ML IJ SOLN: 0.5000 mg | Freq: Once | INTRAMUSCULAR | Status: DC | PRN

## 2023-11-14 MED ORDER — LORAZEPAM 2 MG/ML IJ SOLN: 0.5000 mg | Freq: Once | INTRAMUSCULAR | Status: DC

## 2023-11-14 MED ORDER — LEVETIRACETAM 500 MG PO TABS
1000.0000 mg | ORAL_TABLET | Freq: Two times a day (BID) | ORAL | Status: DC
  Administered 2023-11-14 – 2023-11-15 (×3): 1000 mg via ORAL
  Filled 2023-11-14 (×3): qty 2

## 2023-11-14 MED ORDER — MORPHINE SULFATE (PF) 2 MG/ML IV SOLN
2.0000 mg | Freq: Once | INTRAVENOUS | Status: AC | PRN
  Administered 2023-11-14: 2 mg via INTRAVENOUS
  Filled 2023-11-14: qty 1

## 2023-11-14 MED ORDER — NALOXONE HCL 0.4 MG/ML IJ SOLN: 0.4000 mg | INTRAMUSCULAR | Status: DC | PRN

## 2023-11-14 MED ORDER — MORPHINE SULFATE (PF) 2 MG/ML IV SOLN: 2.0000 mg | Freq: Once | INTRAVENOUS | Status: DC

## 2023-11-14 MED ORDER — EZETIMIBE 10 MG PO TABS
10.0000 mg | ORAL_TABLET | Freq: Every day | ORAL | Status: DC
  Administered 2023-11-14 – 2023-11-15 (×2): 10 mg via ORAL
  Filled 2023-11-14 (×2): qty 1

## 2023-11-14 MED ORDER — ASPIRIN 81 MG PO CHEW
81.0000 mg | CHEWABLE_TABLET | Freq: Every day | ORAL | Status: DC
  Administered 2023-11-14 – 2023-11-15 (×2): 81 mg via ORAL
  Filled 2023-11-14 (×2): qty 1

## 2023-11-14 MED ORDER — TRAMADOL HCL 50 MG PO TABS
50.0000 mg | ORAL_TABLET | Freq: Two times a day (BID) | ORAL | Status: DC | PRN
  Administered 2023-11-14 (×2): 50 mg via ORAL
  Filled 2023-11-14 (×2): qty 1

## 2023-11-14 NOTE — Evaluation (Signed)
 Occupational Therapy Evaluation Patient Details Name: Miranda Cochran MRN: [id number] DOB: 1951-05-08 Today's Date: 11/14/2023   History of Present Illness   Pt is a 73 y.o. female presenting after fall 5/14. CT with occipital skull fracture, traumatic subarachnoid hemorrhage and right subdural hematoma. Repeat CT increased size of R subdural hematoma. Intubated 5/15-5/23 secondary to respiratory failure secondary to E. coli, Streptococcus HAP. EEG 5/16 with cortical dysfunction arising from R hemisphere, moderate-severe diffuse encephalopathy. EEG 5/17 with seizure activity averaging 4 seizures per hour lasting 1 min each; resolved with adjustment to medications. RLE DVT 5/19 and IVC filter placed. PMH significant for autoimmune hepatitis with cirrhosis of liver, HTN, depression, GERD, HLD, DM, IBS, osteoporosis, thyroid  nodule, vascular disease, vitamin D deficiency.     Clinical Impressions Pt presents with decline in function and safety with ADLs and ADL mobility with impaired strength, balance, endurance and cognition; pt with decreased insight into deficits and very distracted about going home and her husband being able to assist her. Pt restless upon arrival but agreeable to work with therapy. PTA pt lives with her husband and was very active, Ind with ADLs, IADLs, home mgt, driving, working and used no AD for mobility. Pt currently requires mod A with LB ADLs, CGA with UB ADLs/grooming seated, min A with mobility/transfers and mod A with toileting tasks. Pt required redirection multiple times due to pre occupation with going home immediately. Pt declined to sit in recliner at end of session. Pt would benefit from acute OT services to address impairments to maximize level of function and safety     If plan is discharge home, recommend the following:   A lot of help with bathing/dressing/bathroom;A little help with walking and/or transfers;Assistance with cooking/housework;Direct  supervision/assist for financial management;Assist for transportation;Direct supervision/assist for medications management;Help with stairs or ramp for entrance     Functional Status Assessment   Patient has had a recent decline in their functional status and demonstrates the ability to make significant improvements in function in a reasonable and predictable amount of time.     Equipment Recommendations   Other (comment) (defer)     Recommendations for Other Services         Precautions/Restrictions   Precautions Precautions: Fall Recall of Precautions/Restrictions: Impaired Precaution/Restrictions Comments: Prior compression fxs, no formal precautions ordered. Cortrak. Restrictions Weight Bearing Restrictions Per Provider Order: No     Mobility Bed Mobility Overal bed mobility: Needs Assistance Bed Mobility: Rolling, Sidelying to Sit, Sit to Sidelying Rolling: Contact guard assist Sidelying to sit: Contact guard assist, HOB elevated     Sit to sidelying: Contact guard assist General bed mobility comments: distracted by desire to go home    Transfers Overall transfer level: Needs assistance Equipment used: 1 person hand held assist, Rolling walker (2 wheels) Transfers: Sit to/from Stand, Bed to chair/wheelchair/BSC Sit to Stand: Min assist     Step pivot transfers: Min assist            Balance Overall balance assessment: Needs assistance Sitting-balance support: Feet supported, No upper extremity supported Sitting balance-Leahy Scale: Fair     Standing balance support: Bilateral upper extremity supported Standing balance-Leahy Scale: Poor                             ADL either performed or assessed with clinical judgement   ADL Overall ADL's : Needs assistance/impaired   Eating/Feeding Details (indicate cue type and reason): cortrak Grooming:  Wash/dry hands;Wash/dry face;Contact guard assist;Sitting   Upper Body Bathing: Minimal  assistance;Sitting   Lower Body Bathing: Moderate assistance   Upper Body Dressing : Minimal assistance;Sitting   Lower Body Dressing: Moderate assistance   Toilet Transfer: Minimal assistance;Ambulation;Rolling walker (2 wheels);Cueing for safety   Toileting- Clothing Manipulation and Hygiene: Moderate assistance;Sit to/from stand       Functional mobility during ADLs: Minimal assistance General ADL Comments: pt restless, cueing for safety/sequencing required     Vision Baseline Vision/History: 1 Wears glasses Patient Visual Report: No change from baseline       Perception         Praxis         Pertinent Vitals/Pain Pain Assessment Pain Assessment: Faces Faces Pain Scale: Hurts a little bit Pain Location: back Pain Descriptors / Indicators: Aching, Restless Pain Intervention(s): Monitored during session, Repositioned     Extremity/Trunk Assessment Upper Extremity Assessment Upper Extremity Assessment: Generalized weakness   Lower Extremity Assessment Lower Extremity Assessment: Defer to PT evaluation       Communication Communication Communication: No apparent difficulties   Cognition Arousal: Alert Behavior During Therapy: Restless                                 Following commands: Impaired Following commands impaired: Follows one step commands inconsistently, Follows one step commands with increased time     Cueing  General Comments   Cueing Techniques: Verbal cues;Gestural cues;Tactile cues      Exercises     Shoulder Instructions      Home Living Family/patient expects to be discharged to:: Private residence Living Arrangements: Spouse/significant other Available Help at Discharge: Family Type of Home: Apartment Home Access: Level entry     Home Layout: One level     Bathroom Shower/Tub: Chief Strategy Officer: Standard     Home Equipment: Cane - quad;Shower seat;Grab bars - tub/shower   Additional  Comments: pt reports that she has a high toilet seat and tub bench on order  Lives With: Spouse    Prior Functioning/Environment Prior Level of Function : History of Falls (last six months);Independent/Modified Independent;Working/employed;Driving             Mobility Comments: Ind ADLs Comments: Ind with ADLs, IADLs, works, drives    OT Problem List: Decreased strength;Impaired balance (sitting and/or standing);Decreased range of motion;Decreased activity tolerance;Impaired vision/perception;Decreased coordination;Decreased cognition;Decreased safety awareness;Decreased knowledge of use of DME or AE;Decreased knowledge of precautions;Cardiopulmonary status limiting activity;Impaired UE functional use   OT Treatment/Interventions: Self-care/ADL training;Therapeutic exercise;DME and/or AE instruction;Therapeutic activities;Patient/family education;Balance training      OT Goals(Current goals can be found in the care plan section)   Acute Rehab OT Goals Patient Stated Goal: to go home OT Goal Formulation: With patient/family Time For Goal Achievement: 11/28/23 ADL Goals Pt Will Perform Grooming: with contact guard assist;standing Pt Will Perform Upper Body Bathing: with supervision;with set-up Pt Will Perform Lower Body Bathing: with min assist Pt Will Perform Upper Body Dressing: with supervision;with set-up Pt Will Perform Lower Body Dressing: with min assist Pt Will Transfer to Toilet: with contact guard assist;with supervision;ambulating;regular height toilet;grab bars Pt Will Perform Toileting - Clothing Manipulation and hygiene: with min assist;with contact guard assist;sit to/from stand   OT Frequency:  Min 2X/week    Co-evaluation              AM-PAC OT "6 Clicks" Daily Activity  Outcome Measure   Help from another person taking care of personal grooming?: A Little Help from another person toileting, which includes using toliet, bedpan, or urinal?: A  Lot Help from another person bathing (including washing, rinsing, drying)?: A Lot Help from another person to put on and taking off regular upper body clothing?: A Little Help from another person to put on and taking off regular lower body clothing?: A Lot 6 Click Score: 12   End of Session Equipment Utilized During Treatment: Gait belt;Rolling walker (2 wheels) Nurse Communication: Mobility status  Activity Tolerance: Patient tolerated treatment well;Other (comment) (restless) Patient left: in bed;with call bell/phone within reach;with bed alarm set  OT Visit Diagnosis: Unsteadiness on feet (R26.81);Muscle weakness (generalized) (M62.81);Repeated falls (R29.6);Other abnormalities of gait and mobility (R26.89);History of falling (Z91.81);Low vision, both eyes (H54.2);Feeding difficulties (R63.3);Other symptoms and signs involving cognitive function;Cognitive communication deficit (R41.841);Hemiplegia and hemiparesis Symptoms and signs involving cognitive functions: Nontraumatic SAH Hemiplegia - Right/Left: Left Hemiplegia - dominant/non-dominant: Non-Dominant Hemiplegia - caused by: Nontraumatic SAH                Time: 6578-4696 OT Time Calculation (min): 24 min Charges:  OT General Charges $OT Visit: 1 Visit OT Evaluation $OT Eval Low Complexity: 1 Low OT Treatments $Therapeutic Activity: 8-22 mins    Alfred Ann 11/14/2023, 2:20 PM

## 2023-11-14 NOTE — Progress Notes (Signed)
 PROGRESS NOTE    Miranda Cochran  ZOX:[id number] DOB: 07/28/1950 DOA: 11/13/2023 PCP: Donley Furth, MD      Brief Narrative:   Miranda Cochran is a 73 y.o. female with medical history significant of "HTN, DM type II, GAVE syndrome causing GI bleed, alcohol abuse, autoimmune hepatitis with cirrhosis of liver, chronically on prednisone , GERD, hiatal hernia, and PAD presented after falling and striking her head.  Patient was admitted on 10/26/2023  for TBI. ER labs showed hypokalemia with prolonged QTc on EKG, WBC 16.5, SBP > 200. CT significant for evidence of occipital skull fracture and traumatic subarachnoid hemorrhage and right subdural hematoma.  On 5/15 patient developed AMS and tachyarrhythmia.  Dr. Bonnita Buttner consulted, CT showed TBI with traumatic increasing right subdural hematoma, SAH and right temporal contusion. MRI showed small acute infarcts in bilateral cerebral hemispheres right more than left.  PCCM consulted Dr. Ardelle Kos intubated for worsening TBI, EEG revealed seizure activity, patient received Keppra /phenobarbital .  Serial head CT's have been stable and show improvement."   Pt discharged to IP rehab on 5/30 and had runs of SVT c/b hypotension overnight on 5/31 (discovered during VS rounds); thus, she was readmitted to medicine for closer monitoring.Pt currently sitting up in bed and able to converse w/o difficulty. She is moving all of he extremities, and asking to eat food and stop her tube feeds (pt able to tol PO per husband at bedside fro 2 meals yesterday). Pt continues in SR w/ rates of 90s on my exam.    Of note, recent "hospital course significant for RLE DVT, IVC filter was placed by Dr. Nereida Banning she was not a candidate for anticoagulation given falls/TBI. Alk phos trended upwards requiring close monitoring of LFT's. CTA chest negative for PE but showed LLL PNA.  Dr. Stann Earnest consulted for runs of SVT that later terminated with adenosine .  Lopressor  and amiodarone  initiated via  tube.  TTE showed normal EF and moderate AS.  Coretrak placed 5/21 due to expressive aphasia and dysphagia SLP following.  She continues to be limited by left-sided weakness, expressive aphasia and dysphagia. She is mod assist and was independent PTA."   Assessment & Plan:   Principal Problem:   SVT (supraventricular tachycardia) (HCC)  # AVNRT The reason for readmission. Cardiology following. No recurrence overnight. - mgmt per cardiology, currently on amio 200 daily and metop tart 25 q8  # Traumatic subdural hemorrhage # Traumatic intraparenchymal hemorrhage # Bilateral CVA Recent admit for this with expressive aphasia, dysphagia, and left-sided weakness. Discharged to CIR. Improving - PT/OT consults - SLP consult as well, patient is able to eat some, continue tube feeds for now  # Seizures 2/2 above - continue keppra   # HCAP Recently treated, resolved  # Cirrhosis # Autoimmune hepatitis Stable - cont home prednisone   # RLE DVT During recent hospitalization. IVC filter placed, not anticoagulated 2/2 brain bleed. CTA was negative fo rPE - monitor  # History mod to severe aortic stenosis Noted  # Hx alcohol abuse Recent consumption less and now s/p extended hospitalization, did not withdraw  # T2DM Euglycemic - continue basal/bolus/sliding    DVT prophylaxis: SCDs Code Status: full Family Communication: husband updated @ bedside  Level of care: Telemetry Cardiac Status is: Inpatient Remains inpatient appropriate because: need for inpatient monitoring    Consultants:  cardiology  Procedures: none  Antimicrobials:  none   Subjective: Feeling fine, no chest pain or palpitations  Objective: Vitals:   11/13/23 1633 11/14/23 0106  11/14/23 0530 11/14/23 0737  BP: (!) 122/57 (!) 93/55 122/72 110/64  Pulse: 98     Resp: 18 18 18    Temp:  (!) 97.5 F (36.4 C) 97.9 F (36.6 C) 97.6 F (36.4 C)  TempSrc:  Axillary Axillary Oral  SpO2: 96%      Weight:      Height:        Intake/Output Summary (Last 24 hours) at 11/14/2023 0846 Last data filed at 11/13/2023 1110 Gross per 24 hour  Intake --  Output 300 ml  Net -300 ml   Filed Weights   11/13/23 1109  Weight: 53.5 kg    Examination:  General exam: Appears calm and comfortable  Respiratory system: Clear to auscultation. Respiratory effort normal. Cardiovascular system: S1 & S2 heard, RRR. Systolic murmur Gastrointestinal system: Abdomen is nondistended, soft and nontender.   Central nervous system: Alert and oriented. Expressive aphasia. Mild left sided weakness Extremities: Symmetric 5 x 5 power. Skin: No rashes, lesions or ulcers Psychiatry: Judgement and insight appear normal. Mood & affect appropriate.     Data Reviewed: I have personally reviewed following labs and imaging studies  CBC: Recent Labs  Lab 11/08/23 0425 11/09/23 0437 11/10/23 0556 11/11/23 0513 11/12/23 0731 11/13/23 0638  WBC 7.5 6.5 6.6 6.9 7.6 7.5  NEUTROABS 4.8 4.1 4.0 4.3 4.0  --   HGB 9.4* 10.2* 10.6* 11.2* 11.1* 11.2*  HCT 29.7* 31.5* 32.8* 35.4* 35.7* 34.7*  MCV 100.7* 100.0 99.4 101.1* 100.6* 97.7  PLT 227 259 267 292 370 344   Basic Metabolic Panel: Recent Labs  Lab 11/08/23 0425 11/09/23 0437 11/10/23 0556 11/11/23 0513 11/12/23 0731 11/13/23 0638  NA 135 136 134* 138 137 135  K 3.8 3.8 4.1 3.1* 3.8 4.1  CL 107 104 106 106 106 102  CO2 19* 21* 20* 21* 19* 24  GLUCOSE 201* 191* 308* 165* 160* 178*  BUN 15 15 15 14 15 20   CREATININE 0.48 0.44 0.51 0.50 0.50 0.52  CALCIUM  7.9* 8.6* 8.3* 8.6* 8.4* 8.5*  MG 2.0 1.9 2.0 2.2  --   --   PHOS 4.0 3.1 3.3 4.0  --   --    GFR: Estimated Creatinine Clearance: 54.5 mL/min (by C-G formula based on SCr of 0.52 mg/dL). Liver Function Tests: Recent Labs  Lab 11/08/23 0425 11/09/23 0437 11/10/23 0556 11/11/23 0513 11/12/23 0731  AST 79* 66* 57* 55* 52*  ALT 62* 61* 54* 49* 45*  ALKPHOS 224* 248* 248* 238* 231*  BILITOT  0.7 0.7 0.8 0.6 0.8  PROT 6.0* 6.8 6.9 7.3 7.0  ALBUMIN 2.4* 2.7* 2.7* 3.0* 2.9*   No results for input(s): "LIPASE", "AMYLASE" in the last 168 hours. Recent Labs  Lab 11/09/23 0437  AMMONIA <13   Coagulation Profile: Recent Labs  Lab 11/09/23 0437  INR 1.1   Cardiac Enzymes: No results for input(s): "CKTOTAL", "CKMB", "CKMBINDEX", "TROPONINI" in the last 168 hours. BNP (last 3 results) No results for input(s): "PROBNP" in the last 8760 hours. HbA1C: No results for input(s): "HGBA1C" in the last 72 hours. CBG: Recent Labs  Lab 11/13/23 0754 11/13/23 1434 11/13/23 1637 11/13/23 2129 11/14/23 0528  GLUCAP 161* 321* 286* 153* 116*   Lipid Profile: No results for input(s): "CHOL", "HDL", "LDLCALC", "TRIG", "CHOLHDL", "LDLDIRECT" in the last 72 hours. Thyroid  Function Tests: No results for input(s): "TSH", "T4TOTAL", "FREET4", "T3FREE", "THYROIDAB" in the last 72 hours. Anemia Panel: No results for input(s): "VITAMINB12", "FOLATE", "FERRITIN", "TIBC", "IRON ", "RETICCTPCT" in the  last 72 hours. Urine analysis:    Component Value Date/Time   COLORURINE YELLOW 10/27/2023 1255   APPEARANCEUR CLEAR 10/27/2023 1255   LABSPEC 1.018 10/27/2023 1255   PHURINE 6.0 10/27/2023 1255   GLUCOSEU >=500 (A) 10/27/2023 1255   GLUCOSEU NEGATIVE 08/01/2019 0907   HGBUR NEGATIVE 10/27/2023 1255   BILIRUBINUR NEGATIVE 10/27/2023 1255   BILIRUBINUR neg 12/28/2013 1209   KETONESUR 20 (A) 10/27/2023 1255   PROTEINUR 30 (A) 10/27/2023 1255   UROBILINOGEN 1.0 08/01/2019 0907   NITRITE NEGATIVE 10/27/2023 1255   LEUKOCYTESUR NEGATIVE 10/27/2023 1255   Sepsis Labs: @LABRCNTIP (procalcitonin:4,lacticidven:4)  )No results found for this or any previous visit (from the past 240 hours).       Radiology Studies: No results found.      Scheduled Meds:  amiodarone   200 mg Oral Daily   aspirin   81 mg Oral Daily   ezetimibe   10 mg Oral Daily   fiber supplement (BANATROL TF)  60 mL Per  Tube BID   insulin  aspart  0-15 Units Subcutaneous TID WC   insulin  glargine-yfgn  15 Units Subcutaneous Daily   levETIRAcetam   1,000 mg Oral BID   metoprolol  tartrate  50 mg Oral Q8H   pantoprazole   40 mg Intravenous QHS   predniSONE   10 mg Oral Q breakfast   thiamine   100 mg Oral Daily   Continuous Infusions:   LOS: 1 day     Raymonde Calico, MD [facility]   If 7PM-7AM, please contact night-coverage [email or web address] Password [facility] 11/14/2023, 8:46 AM

## 2023-11-14 NOTE — Progress Notes (Signed)
 [facility] night cross cover note:   I was notified by the patient's RN that the patient is complaining of residual back pain following her prn 50 mg of tramadol  around 2230, and also complaining of residual anxiety following her prn Xanax  0.25 mg.  I subsequently ordered morphine  2 mg IV x 1 now to help further address her back discomfort, and also ordered Ativan  0.5 mg IV x 1 dose now to attend to the residual anxiety.     Camelia Cavalier, DO Hospitalist

## 2023-11-14 NOTE — Progress Notes (Signed)
 Inpatient Rehabilitation  Patient information reviewed and entered into eRehab system by Feliberto Gottron, M.A., CCC-SLP, Rehab Quality Coordinator.  Information including medical coding, functional ability and quality indicators will be reviewed and updated through discharge.

## 2023-11-14 NOTE — Evaluation (Signed)
 Physical Therapy Evaluation Patient Details Name: Miranda Cochran MRN: [id number] DOB: [birth year] Today's Date: 11/14/2023  History of Present Illness  Pt is a 73 y.o. female presenting after fall 5/14. CT with occipital skull fracture, traumatic subarachnoid hemorrhage and right subdural hematoma. Repeat CT increased size of R subdural hematoma. Intubated 5/15-5/23 secondary to respiratory failure secondary to E. coli, Streptococcus HAP. EEG 5/16 with cortical dysfunction arising from R hemisphere, moderate-severe diffuse encephalopathy. EEG 5/17 with seizure activity averaging 4 seizures per hour lasting 1 min each; resolved with adjustment to medications. RLE DVT 5/19 and IVC filter placed. PMH significant for autoimmune hepatitis with cirrhosis of liver, HTN, depression, GERD, HLD, DM, IBS, osteoporosis, thyroid  nodule, vascular disease, vitamin D deficiency.  Clinical Impression   Pt admitted secondary to problem above with deficits below. PTA patient was independent with all mobility, living with spouse in one level apartment with level entry.  Pt currently requires min assist for most mobility and only able to ambulate 4 ft with RW before reporting she was too fatigued to walk any farther. HR 82 BP 119/81. She talked repeatedly about wanting to go home today, yet could not convince her to sit up in the chair (spouse reported likely due to pain in buttocks from diarrhea from tube feeds.  Anticipate patient will benefit from PT to address problems listed below. Will continue to follow acutely to maximize functional mobility, independence, and safety.  Patient will benefit from continued inpatient follow up therapy, >3 hours/day          If plan is discharge home, recommend the following: Two people to help with walking and/or transfers;Two people to help with bathing/dressing/bathroom;Assistance with feeding;Direct supervision/assist for medications management;Direct supervision/assist for financial  management;Assist for transportation;Help with stairs or ramp for entrance;Supervision due to cognitive status   Can travel by private vehicle        Equipment Recommendations Hospital bed;Wheelchair cushion (measurements PT);Wheelchair (measurements PT);Rolling walker (2 wheels)  Recommendations for Other Services  Rehab consult    Functional Status Assessment Patient has had a recent decline in their functional status and demonstrates the ability to make significant improvements in function in a reasonable and predictable amount of time.     Precautions / Restrictions Precautions Precautions: Fall Recall of Precautions/Restrictions: Impaired Precaution/Restrictions Comments: Prior compression fxs, no formal precautions ordered. Cortrak. Restrictions Weight Bearing Restrictions Per Provider Order: No      Mobility  Bed Mobility Overal bed mobility: Needs Assistance Bed Mobility: Rolling, Sidelying to Sit, Sit to Sidelying Rolling: Contact guard assist Sidelying to sit: Contact guard assist, HOB elevated     Sit to sidelying: Contact guard assist General bed mobility comments: repeated cues to come sit up on EOB (internally distracted by desire to go home)    Transfers Overall transfer level: Needs assistance Equipment used: 1 person hand held assist, Rolling walker (2 wheels) Transfers: Sit to/from Stand, Bed to chair/wheelchair/BSC Sit to Stand: Min assist   Step pivot transfers: Min assist       General transfer comment: posterior lean with min assist to rise and steady over her feet; to recliner with HHA and other hand on armrest; stood from recliner to RW with cues for hand placement and assist    Ambulation/Gait Ambulation/Gait assistance: Min assist Gait Distance (Feet): 4 Feet Assistive device: Rolling walker (2 wheels) Gait Pattern/deviations: Step-to pattern, Decreased stride length, Shuffle, Trunk flexed       General Gait Details: walked from  recliner; when reached end  of bed reported too tired to continue and turned to sit on end of bed; refused to ambulate back to chair and returned to bed  Stairs            Wheelchair Mobility     Tilt Bed    Modified Rankin (Stroke Patients Only) Modified Rankin (Stroke Patients Only) Pre-Morbid Rankin Score: No symptoms Modified Rankin: Moderately severe disability     Balance Overall balance assessment: Needs assistance Sitting-balance support: Feet supported, No upper extremity supported Sitting balance-Leahy Scale: Fair Sitting balance - Comments: Sits EOB and BSC with CGA.   Standing balance support: Bilateral upper extremity supported Standing balance-Leahy Scale: Poor                               Pertinent Vitals/Pain Pain Assessment Pain Assessment: Faces Pain Location: buttocks Pain Descriptors / Indicators: Grimacing, Burning Pain Intervention(s): Limited activity within patient's tolerance, Monitored during session    Home Living Family/patient expects to be discharged to:: Private residence Living Arrangements: Spouse/significant other Available Help at Discharge: Family Type of Home: Apartment Home Access: Level entry       Home Layout: One level Home Equipment: Cane - quad;Shower seat;Grab bars - tub/shower (suction cup grab bars)      Prior Function Prior Level of Function : History of Falls (last six months);Independent/Modified Independent;Working/employed;Driving             Mobility Comments: independent, active ADLs Comments: works in [employer], independent     Extremity/Trunk Assessment   Upper Extremity Assessment Upper Extremity Assessment: Defer to OT evaluation    Lower Extremity Assessment Lower Extremity Assessment: RLE deficits/detail;LLE deficits/detail;Generalized weakness RLE Deficits / Details: knee extension 4, ankle DF 4 RLE Sensation:  (denies changes) LLE Deficits / Details: knee extension 3+, ankle  DF 3+    Cervical / Trunk Assessment Cervical / Trunk Assessment: Kyphotic;Other exceptions Cervical / Trunk Exceptions: forward head posture at EOB and standing, husband reports this is not baseline  Communication   Communication Communication: No apparent difficulties    Cognition Arousal: Alert Behavior During Therapy: Restless   PT - Cognitive impairments: Orientation, Attention, Awareness, Initiation, Sequencing, Problem solving, Safety/Judgement   Orientation impairments: Time               Rancho Levels of Cognitive Functioning Rancho Los Amigos Scales of Cognitive Functioning: Confused, Appropriate: Moderate Assistance [email or web address] Scales of Cognitive Functioning: Confused, Appropriate: Moderate Assistance [VI] PT - Cognition Comments: Follows >75% of simple one step commands with increased time. Aware of self, location, month, not day of the week Following commands: Impaired Following commands impaired: Follows one step commands inconsistently, Follows one step commands with increased time     Cueing Cueing Techniques: Verbal cues, Gestural cues, Tactile cues     General Comments General comments (skin integrity, edema, etc.): Spouse present throughout.    Exercises     Assessment/Plan    PT Assessment Patient needs continued PT services  PT Problem List Decreased activity tolerance;Decreased balance;Decreased coordination;Decreased mobility;Decreased cognition;Decreased safety awareness;Decreased strength;Decreased knowledge of use of DME;Decreased skin integrity;Pain       PT Treatment Interventions DME instruction;Gait training;Functional mobility training;Therapeutic activities;Therapeutic exercise;Balance training;Neuromuscular re-education;Cognitive remediation;Patient/family education    PT Goals (Current goals can be found in the Care Plan section)  Acute Rehab PT Goals Patient Stated Goal: return to independence PT Goal Formulation: With  patient/family Time For Goal Achievement: 11/28/23 Potential to  Achieve Goals: Good    Frequency Min 3X/week     Co-evaluation               AM-PAC PT "6 Clicks" Mobility  Outcome Measure Help needed turning from your back to your side while in a flat bed without using bedrails?: A Little Help needed moving from lying on your back to sitting on the side of a flat bed without using bedrails?: A Little Help needed moving to and from a bed to a chair (including a wheelchair)?: A Little Help needed standing up from a chair using your arms (e.g., wheelchair or bedside chair)?: A Little Help needed to walk in hospital room?: A Lot (<20 ft) Help needed climbing 3-5 steps with a railing? : Total 6 Click Score: 15    End of Session Equipment Utilized During Treatment: Gait belt Activity Tolerance: Patient tolerated treatment well Patient left: with bed alarm set;with call bell/phone within reach;in bed;with family/visitor present Nurse Communication: Mobility status PT Visit Diagnosis: Unsteadiness on feet (R26.81);Muscle weakness (generalized) (M62.81);History of falling (Z91.81);Pain;Hemiplegia and hemiparesis;Other symptoms and signs involving the nervous system (R29.898);Other abnormalities of gait and mobility (R26.89) Hemiplegia - Right/Left: Left Hemiplegia - dominant/non-dominant: Non-dominant Hemiplegia - caused by:  (traumatic SDH) Pain - part of body:  (buttocks)    Time: 8657-8469 PT Time Calculation (min) (ACUTE ONLY): 37 min   Charges:   PT Evaluation $PT Eval Low Complexity: 1 Low PT Treatments $Gait Training: 8-22 mins PT General Charges $$ ACUTE PT VISIT: 1 Visit          Gayle Kava, PT Acute Rehabilitation Services  Office [phone]   [place] Leep 11/14/2023, 9:44 AM

## 2023-11-14 NOTE — Progress Notes (Signed)
 Speech Language Pathology Treatment: Dysphagia  Patient Details Name: Miranda Cochran MRN: [id number] DOB: Jun 04, 1951 Today's Date: 11/14/2023 Time: 7829-5621 SLP Time Calculation (min) (ACUTE ONLY): 14 min  Assessment / Plan / Recommendation Clinical Impression  Pt was sleeping upon SLP arrival but woke up easily with stimulation and was assisted with repositioning for PO intake. Although subtle cough is noted intermittently with PO trials, this overall appears to be fairly consistent with previous documentation of swallowing function. MBS completed just a few days ago noted that throat clearing was effective in clearing penetrates from the airway with honey thick liquids. Pt does still have some oral holding but she does swallow with extra time and Min cues. She does not take more boluses into her oral cavity until she has swallowed what is already there. Recommend to continue with current diet and precautions, with sign made for room.    HPI HPI: Pt is a 73 y.o. female presenting after fall 5/14. CT with occipital skull fracture, traumatic subarachnoid hemorrhage and right subdural hematoma. Repeat CT increased size of R subdural hematoma. Intubated 5/15-5/23 secondary to respiratory failure secondary to E. coli, Streptococcus HAP. EEG 5/16 with cortical dysfunction arising from R hemisphere, moderate-severe diffuse encephalopathy. EEG 5/17 with seizure activity averaging 4 seizures per hour lasting 1 min each; resolved with adjustment to medications. RLE DVT 5/19 and IVC filter placed. PMH significant for autoimmune hepatitis with cirrhosis of liver, HTN, depression, GERD, HLD, DM, IBS, osteoporosis, thyroid  nodule, vascular disease, vitamin D deficiency. BSE followed by MBS completed on 5/29 reported moderate oropharyngeal dysphagia and recommended Dys 1, honey thick liquids. Patient discharged to [facility] and evaluated by SLP on 5/31 but had runs of SVT c/b hypotension overnight on 5/31 (discovered during  VS rounds); thus, she was readmitted to medicine for closer monitoring.      SLP Plan  Continue with current plan of care      Recommendations for follow up therapy are one component of a multi-disciplinary discharge planning process, led by the attending physician.  Recommendations may be updated based on patient status, additional functional criteria and insurance authorization.    Recommendations  Diet recommendations: Dysphagia 1 (puree);Honey-thick liquid Liquids provided via: Cup;No straw Medication Administration: Crushed with puree Supervision: Full supervision/cueing for compensatory strategies Compensations: Slow rate;Small sips/bites;Clear throat intermittently Postural Changes and/or Swallow Maneuvers: Seated upright 90 degrees                  Oral care QID;Oral care before and after PO     Dysphagia, oropharyngeal phase (R13.12)     Continue with current plan of care     Beth Brooke., M.A. CCC-SLP Acute Rehabilitation Services Office: [phone]  Secure chat preferred   11/14/2023, 11:48 AM

## 2023-11-14 NOTE — Progress Notes (Signed)
 Inpatient Rehab Admissions Coordinator:   Note pt transferred to acute setting yesterday AM for SVT (converted to sinus tach prior to discharge).  Will discuss with medical team for timing of potential return to CIR.  Will need to re-do insurance auth for return.    Loye Rumble, PT, DPT Admissions Coordinator [phone] 11/14/23  11:20 AM

## 2023-11-14 NOTE — Discharge Summary (Cosign Needed Addendum)
 Physician Discharge Summary  [name] MRN: [id number] DOB/AGE: 08/11/1950 73 y.o.  Admit date: 11/11/2023 Discharge date: 11/13/2023  Discharge Diagnoses:  Principal Problem:   TBI (traumatic brain injury) (HCC)  SVT:  HTN: GERD: DM: Seizures: HCAP: DVT:   Discharged Condition: Guarded   Significant Diagnostic Studies: DG Swallowing Func-Speech Pathology Result Date: 11/10/2023 Table formatting from the original result was not included. Modified Barium Swallow Study Patient Details Name: Miranda Cochran MRN: [id number] Date of Birth: 1951-01-25 Today's Date: 11/10/2023 HPI/PMH: HPI: Pt is a 73 y.o. female presenting after fall 5/14. CT with occipital skull fracture, traumatic subarachnoid hemorrhage and right subdural hematoma. Repeat CT increased size of R subdural hematoma. Intubated 5/15-5/23 secondary to respiratory failure secondary to E. coli, Streptococcus HAP. EEG 5/16 with cortical dysfunction arising from R hemisphere, moderate-severe diffuse encephalopathy. EEG 5/17 with seizure activity averaging 4 seizures per hour lasting 1 min each; resolved with adjustment to medications. RLE DVT 5/19 and IVC filter placed. PMH significant for autoimmune hepatitis with cirrhosis of liver, HTN, depression, GERD, HLD, DM, IBS, osteoporosis, thyroid  nodule, vascular disease, vitamin D deficiency. Clinical Impression: Clinical Impression: Pt demonstrated a DIGEST score of 2 indicating moderate oropharyngeal dysphagia. Pt had excessive movements, decreased sustained attention and needed cueing throughout study. There was decreased labial closure with anterior spill, trace lingual residue and slow mastication of solid in part due to decreased attention. There was incomplete glottal closure, partial anterior hyoid excursion and partial epiglottic inversion resulting in penetration with thin to the cords that pt sensed and cleared throat to clear penetrates. Chin tuck strategy was  ineffective. Nectar thick penetrated to cords with minimal remaining in vestibule. Laryngeal penetration with honey thick to vocal cords that pt sensed and spontaneous throat clear cleared vestibule. Pharyngeal residue was mild in valleculae and pyriform sinuses. Recommend puree texture, honey thick liquids, intermittent throat clear, crush pills and full assist/supervision with po's. DIGEST Swallow Severity Rating*  Safety:  Efficiency:  Overall Pharyngeal Swallow Severity: 1: mild; 2: moderate; 3: severe; 4: profound *The Dynamic Imaging Grade of Swallowing Toxicity is standardized for the head and neck cancer population, however, demonstrates promising clinical applications across populations to standardize the clinical rating of pharyngeal swallow safety and severity. Factors that may increase risk of adverse event in presence of aspiration Roderick Civatte & Jessy Morocco 2021): Factors that may increase risk of adverse event in presence of aspiration Roderick Civatte & Jessy Morocco 2021): Reduced cognitive function Recommendations/Plan: Swallowing Evaluation Recommendations Swallowing Evaluation Recommendations Recommendations: PO diet PO Diet Recommendation: Moderately thick liquids (Level 3, honey thick); Dysphagia 1 (Pureed) Liquid Administration via: Cup Medication Administration: Crushed with puree Supervision: Staff to assist with self-feeding; Full supervision/cueing for swallowing strategies Swallowing strategies  : Slow rate; Small bites/sips; Clear throat intermittently Postural changes: Position pt fully upright for meals Oral care recommendations: Oral care BID (2x/day) Treatment Plan Treatment Plan Treatment recommendations: Therapy as outlined in treatment plan below Follow-up recommendations: Acute inpatient rehab (3 hours/day) Functional status assessment: Patient has had a recent decline in their functional status and demonstrates the ability to make significant improvements in function in a reasonable and predictable  amount of time. Treatment frequency: Min 2x/week Treatment duration: 2 weeks Interventions: Compensatory techniques; Patient/family education; Trials of upgraded texture/liquids; Diet toleration management by SLP Recommendations Recommendations for follow up therapy are one component of a multi-disciplinary discharge planning process, led by the attending physician.  Recommendations may be updated based on patient status, additional functional criteria and insurance authorization. Assessment:  Orofacial Exam: Orofacial Exam Oral Cavity - Dentition: Adequate natural dentition Orofacial Anatomy: WFL Oral Motor/Sensory Function: WFL Anatomy: Anatomy: WFL Boluses Administered: Boluses Administered Boluses Administered: Thin liquids (Level 0); Mildly thick liquids (Level 2, nectar thick); Moderately thick liquids (Level 3, honey thick); Puree; Solid  Oral Impairment Domain: Oral Impairment Domain Lip Closure: Escape progressing to mid-chin Tongue control during bolus hold: Cohesive bolus between tongue to palatal seal Bolus preparation/mastication: Slow prolonged chewing/mashing with complete recollection Bolus transport/lingual motion: Brisk tongue motion Oral residue: Trace residue lining oral structures Location of oral residue : Tongue Initiation of pharyngeal swallow : Pyriform sinuses  Pharyngeal Impairment Domain: Pharyngeal Impairment Domain Soft palate elevation: No bolus between soft palate (SP)/pharyngeal wall (PW) Laryngeal elevation: Complete superior movement of thyroid  cartilage with complete approximation of arytenoids to epiglottic petiole Anterior hyoid excursion: Partial anterior movement Epiglottic movement: Partial inversion Laryngeal vestibule closure: Incomplete, narrow column air/contrast in laryngeal vestibule Pharyngeal stripping wave : Present - complete Pharyngeal contraction (A/P view only): N/A Pharyngoesophageal segment opening: Complete distension and complete duration, no obstruction of  flow Tongue base retraction: No contrast between tongue base and posterior pharyngeal wall (PPW) Pharyngeal residue: Collection of residue within or on pharyngeal structures Location of pharyngeal residue: Valleculae; Pyriform sinuses  Esophageal Impairment Domain: Esophageal Impairment Domain Esophageal clearance upright position: Esophageal retention Pill: No data recorded Penetration/Aspiration Scale Score: Penetration/Aspiration Scale Score 1.  Material does not enter airway: Solid; Puree 2.  Material enters airway, remains ABOVE vocal cords then ejected out: Moderately thick liquids (Level 3, honey thick) 3.  Material enters airway, remains ABOVE vocal cords and not ejected out: Moderately thick liquids (Level 3, honey thick) 4.  Material enters airway, CONTACTS cords then ejected out: Moderately thick liquids (Level 3, honey thick) Compensatory Strategies: Compensatory Strategies Compensatory strategies: Yes Chin tuck: Ineffective Ineffective Chin Tuck: Thin liquid (Level 0)   General Information: Caregiver present: No  Diet Prior to this Study: NPO; Cortrak/Small bore NG tube   Temperature : Normal   Respiratory Status: WFL   Supplemental O2: None (Room air)   History of Recent Intubation: Yes  Behavior/Cognition: Alert; Cooperative; Confused; Distractible; Requires cueing Self-Feeding Abilities: Needs assist with self-feeding Baseline vocal quality/speech: Normal No data recorded No data recorded Exam Limitations: Excessive movement Goal Planning: Prognosis for improved oropharyngeal function: Good Barriers to Reach Goals: Cognitive deficits No data recorded No data recorded Consulted and agree with results and recommendations: Patient; Nurse Pain: Pain Assessment Pain Assessment: Faces Faces Pain Scale: 0 Breathing: 0 Negative Vocalization: 0 Facial Expression: 0 Body Language: 0 Consolability: 0 PAINAD Score: 0 Facial Expression: 0 Body Movements: 0 Muscle Tension: 0 Compliance with ventilator (intubated  pts.): N/A Vocalization (extubated pts.): 0 CPOT Total: 0 Pain Location: generalized, back, sacral sores Pain Descriptors / Indicators: Discomfort; Grimacing Pain Intervention(s): Monitored during session End of Session: Start Time:SLP Start Time (ACUTE ONLY): 1418 Stop Time: SLP Stop Time (ACUTE ONLY): 1440 Time Calculation:SLP Time Calculation (min) (ACUTE ONLY): 22 min Charges: SLP Evaluations $ SLP Speech Visit: 1 Visit SLP Evaluations $MBS Swallow: 1 Procedure $Swallowing Treatment: 1 Procedure SLP visit diagnosis: SLP Visit Diagnosis: Dysphagia, oropharyngeal phase (R13.12) Past Medical History: Past Medical History: Diagnosis Date  Abnormal Pap smear of cervix   05-15-21 ascus hpv hr+  Allergy   Anxiety   on meds  Autoimmune hepatitis (HCC) 08/18/2016  08/2016 liver bx confirms, fibrosis not cirrhosis  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 08/29/2020  COVID 06/27/2020  Depression   on meds  DM (diabetes mellitus) (HCC)   Fracture, fibula   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  GERD (gastroesophageal reflux disease)   on meds  Hiatal hernia   Hx of adenomatous polyp of colon 07/07/2009  Hyperlipidemia   diet controlled  Hypertension   on meds - LASIX   IBS (irritable bowel syndrome)   Iron  deficiency anemia   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 Willy Harvest, adenimatous polyps, repeat in 5 yrs  ESOPHAGOGASTRODUODENOSCOPY  07/06/2021  per Dr. Willy Harvest, showed portal hypertensive gastropathy, repeat in 2 yrs  ESOPHAGOGASTRODUODENOSCOPY (EGD) WITH PROPOFOL  N/A 06/20/2017  Procedure:  ESOPHAGOGASTRODUODENOSCOPY (EGD) WITH PROPOFOL ;  Surgeon: Kenney Peacemaker, MD;  Location: [facility];  Service: Endoscopy;  Laterality: N/A;  with Barrx  EYE SURGERY    IR IVC FILTER PLMT / S&I /IMG GUID/MOD SED  10/31/2023  NASAL SEPTOPLASTY W/ TURBINOPLASTY    POLYPECTOMY  2015  CG-TA  TONSILLECTOMY   Miranda Cochran 11/10/2023, 3:49 PM  ECHOCARDIOGRAM COMPLETE Result Date: 11/07/2023    ECHOCARDIOGRAM REPORT   Patient Name:   Miranda Cochran Date of Exam: 11/07/2023 Medical Rec #:  [id number]     Height:       64.0 in Accession #:    [id number]    Weight:       124.6 lb Date of Birth:  1951-02-17    BSA:          1.600 m Patient Age:    72 years      BP:           117/71 mmHg Patient Gender: F             HR:           89 bpm. Exam Location:  Inpatient Procedure: 2D Echo, Cardiac Doppler and Color Doppler (Both Spectral and Color            Flow Doppler were utilized during procedure). Indications:    Abnormal ECG R94.31  History:        Patient has prior history of Echocardiogram examinations, most                 recent 10/27/2023.  Sonographer:    Terrilee Few RCS Referring Phys: Florie Husband Andrez Banker [facility]  Sonographer Comments: Image acquisition challenging due to uncooperative patient. IMPRESSIONS  1. Left ventricular ejection fraction, by estimation, is 60 to 65%. The left ventricle has normal function. The left ventricle has no regional wall motion abnormalities. There is moderate concentric left ventricular hypertrophy. Left ventricular diastolic parameters are consistent with Grade I diastolic dysfunction (impaired relaxation).  2. Right ventricular systolic function is normal. The right ventricular size is normal. There is normal pulmonary artery systolic pressure. The estimated right ventricular systolic pressure is 25.3 mmHg.  3. The mitral valve is normal in structure. Trivial mitral valve regurgitation. No evidence of mitral stenosis.  4. The aortic valve is tricuspid. There is severe  calcifcation of the aortic valve. Aortic valve regurgitation is not visualized. Moderate to severe aortic valve stenosis. Aortic valve mean gradient measures 26.0 mmHg,  AVA 0.96 cm^2.  5. The inferior vena cava is normal in size with greater than 50% respiratory variability, suggesting right atrial pressure of 3 mmHg. FINDINGS  Left Ventricle: Left ventricular ejection fraction, by estimation, is 60 to 65%. The left ventricle has normal function. The left ventricle has no regional wall motion abnormalities. The left ventricular internal cavity size was normal in size. There is  moderate concentric left ventricular hypertrophy. Left ventricular diastolic parameters are consistent with Grade I diastolic dysfunction (impaired relaxation). Right Ventricle: The right ventricular size is normal. No increase in right ventricular wall thickness. Right ventricular systolic function is normal. There is normal pulmonary artery systolic pressure. The tricuspid regurgitant velocity is 2.36 m/s, and  with an assumed right atrial pressure of 3 mmHg, the estimated right ventricular systolic pressure is 25.3 mmHg. Left Atrium: Left atrial size was normal in size. Right Atrium: Right atrial size was normal in size. Pericardium: There is no evidence of pericardial effusion. Mitral Valve: The mitral valve is normal in structure. Trivial mitral valve regurgitation. No evidence of mitral valve stenosis. Tricuspid Valve: The tricuspid valve is normal in structure. Tricuspid valve regurgitation is trivial. Aortic Valve: The aortic valve is tricuspid. There is severe calcifcation of the aortic valve. Aortic valve regurgitation is not visualized. Moderate to severe aortic stenosis is present. Aortic valve mean gradient measures 26.0 mmHg. Aortic valve peak gradient measures 39.4 mmHg. Aortic valve area, by VTI measures 1.08 cm. Pulmonic Valve: The pulmonic valve was normal in structure. Pulmonic valve regurgitation is not visualized. Aorta:  The aortic root is normal in size and structure. Venous: The inferior vena cava is normal in size with greater than 50% respiratory variability, suggesting right atrial pressure of 3 mmHg. IAS/Shunts: No atrial level shunt detected by color flow Doppler.  LEFT VENTRICLE PLAX 2D LVIDd:         2.60 cm     Diastology LV PW:         1.20 cm     LV e' medial:    6.53 cm/s LV IVS:        1.40 cm     LV E/e' medial:  11.7 LVOT diam:     2.10 cm     LV e' lateral:   6.53 cm/s LV SV:         55          LV E/e' lateral: 11.7 LV SV Index:   34 LVOT Area:     3.46 cm  LV Volumes (MOD) LV vol d, MOD A2C: 47.6 ml LV vol d, MOD A4C: 46.9 ml LV vol s, MOD A2C: 20.5 ml LV vol s, MOD A4C: 18.6 ml LV SV MOD A2C:     27.1 ml LV SV MOD A4C:     46.9 ml LV SV MOD BP:      30.3 ml RIGHT VENTRICLE             IVC RV S prime:     19.30 cm/s  IVC diam: 1.60 cm TAPSE (M-mode): 2.0 cm LEFT ATRIUM             Index        RIGHT ATRIUM           Index LA diam:        2.80 cm 1.75 cm/m   RA Area:     11.00 cm LA Vol (A2C):   26.3 ml 16.44 ml/m  RA Volume:   23.30 ml  14.56 ml/m LA Vol (A4C):   30.8 ml 19.25 ml/m LA Biplane Vol: 31.0 ml 19.38 ml/m  AORTIC VALVE AV Area (Vmax):    1.14 cm AV Area (Vmean):   1.09 cm AV Area (VTI):     1.08 cm AV Vmax:           314.00 cm/s AV Vmean:          217.333 cm/s AV VTI:            0.507 m AV Peak Grad:      39.4 mmHg AV Mean Grad:      26.0 mmHg LVOT Vmax:         103.00 cm/s LVOT Vmean:        68.300 cm/s LVOT VTI:          0.158 m LVOT/AV VTI ratio: 0.31  AORTA Ao Root diam: 3.40 cm Ao Asc diam:  3.60 cm MITRAL VALVE                TRICUSPID VALVE MV Area (PHT): 5.31 cm     TR Peak grad:   22.3 mmHg MV Decel Time: 143 msec     TR Vmax:        236.00 cm/s MV E velocity: 76.20 cm/s MV A velocity: 109.00 cm/s  SHUNTS MV E/A ratio:  0.70         Systemic VTI:  0.16 m                             Systemic Diam: 2.10 cm Dalton McleanMD Electronically signed by Archer Bear Signature Date/Time:  11/07/2023/1:37:34 PM    Final    DG Abd Portable 1V Result Date: 11/07/2023 CLINICAL DATA:  Abdominal pain. EXAM: PORTABLE ABDOMEN - 1 VIEW COMPARISON:  Radiograph 10/28/2023, CT 10/26/2023 FINDINGS: Tip of the weighted enteric tube in the region of the distal stomach. No bowel dilatation or evidence of obstruction. No significant formed stool in the colon. Multiple vascular calcifications. IVC filter in place. 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 IMPRESSION: 1. No radiographic explanation for pain. Nonobstructive bowel gas pattern. 2. Tip of the weighted enteric tube in the region of the distal stomach. Electronically Signed   By: Chadwick Colonel M.D.   On: 11/07/2023 12:08   DG Chest Port 1 View Result Date: 11/07/2023 CLINICAL DATA:  Shortness of breath. EXAM: PORTABLE CHEST 1 VIEW COMPARISON:  10/31/2023 FINDINGS: Interval extubation. Left IJ central line has been removed in the interval. A feeding tube passes into the stomach although the distal tip position is not included on the film. The cardio pericardial silhouette is enlarged. The lungs are clear without focal pneumonia, edema, pneumothorax or pleural effusion. Bones are diffusely demineralized. Telemetry leads overlie the chest. IMPRESSION: 1. Interval extubation and removal of left IJ central line. 2. No acute cardiopulmonary findings. Electronically Signed   By: Donnal Fusi M.D.   On: 11/07/2023 07:17   CT ANGIO HEAD NECK W WO CM Result Date: 11/05/2023 CLINICAL DATA:  Stroke workup EXAM: CT ANGIOGRAPHY HEAD AND NECK WITH AND WITHOUT CONTRAST TECHNIQUE: Multidetector CT imaging of the head and neck was performed using the standard protocol during bolus administration of intravenous contrast. Multiplanar CT image reconstructions and MIPs were obtained to evaluate the vascular anatomy. Carotid stenosis measurements (when applicable) are obtained utilizing NASCET criteria, using the distal internal carotid diameter as the denominator. RADIATION  DOSE REDUCTION: This exam was performed according  to the departmental dose-optimization program which includes automated exposure control, adjustment of the mA and/or kV according to patient size and/or use of iterative reconstruction technique. CONTRAST:  75mL OMNIPAQUE  IOHEXOL  350 MG/ML SOLN COMPARISON:  Brain MRI from yesterday and head CT from 3 days ago FINDINGS: CT HEAD FINDINGS Brain: Hemorrhagic contusions in subdural collection show no interval increase. No evidence of progressive white matter infarction best seen by recent MRI. No hydrocephalus or shift Vascular: No hyperdense vessel or unexpected calcification. Skull: Left posterior scalp swelling with linear fracture in the posterior left calvarium that is nondepressed. Sinuses/Orbits: Mild mastoid and sinus opacification in the setting of nasal intubation Review of the MIP images confirms the above findings CTA NECK FINDINGS Aortic arch: Atheromatous plaque with 3 vessel branching Right carotid system: Moderate mixed density plaque centered at the bifurcation without ulceration or significant stenosis. Left carotid system: Moderate mixed density plaque at the proximal left ICA without ulceration or flow reducing stenosis. Vertebral arteries: No proximal subclavian stenosis. Atheromatous plaque at both vertebral origins with right V1 stenosis approaching 50%. No dissection, beading, or ulceration. Skeleton: Generalized cervical spine degeneration.  No acute finding Other neck: No acute finding Upper chest: Clear apical lungs Review of the MIP images confirms the above findings CTA HEAD FINDINGS Anterior circulation: Mild atheromatous calcification on the cavernous carotids. Generalized medium vessel undulation attributed to atherosclerosis, no correctable in flow reducing proximal stenosis. No branch occlusion, beading, or aneurysm Posterior circulation: Left dominant vertebral artery. Vertebral and basilar arteries are sufficiently patent with moderate  calcified plaque at the left V4 segment. Fetal type right PCA flow. No branch occlusion, beading, or aneurysm. Venous sinuses: Unremarkable for arterial timing Anatomic variants: As above Review of the MIP images confirms the above findings IMPRESSION: No emergent finding. Cervical and intracranial atherosclerosis with nearly 50% stenosis at the right vertebral origin. Electronically Signed   By: Ronnette Coke M.D.   On: 11/05/2023 05:21   MR BRAIN WO CONTRAST Result Date: 11/04/2023 CLINICAL DATA:  Head trauma, moderate to severe. EXAM: MRI HEAD WITHOUT CONTRAST TECHNIQUE: Multiplanar, multiecho pulse sequences of the brain and surrounding structures were obtained without intravenous contrast. COMPARISON:  Head CT from 2 days ago FINDINGS: Brain: Small areas of restricted diffusion with nodular and bandlike appearance clustered in the deep right cerebral white matter, centered at the corona radiata, and in the left posterior frontal white matter. Some diffusion restriction along the surface of the posterior right temporal lobe which is likely contusion with susceptibility from subarachnoid blood products when compared with initial imaging. Seizure phenomenon is also considered, there has been EEG monitoring based on prior head CT. Known subdural hematoma along the right more than left cerebral convexity, greatest at the anterior right frontal lobe measuring up to 6 mm thickness. Hemorrhagic contusion in the right anterior inferior frontotemporal region and low left cerebellum. Hygroma along the left frontal convexity up to 5 mm in thickness. 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 No hydrocephalus. Vascular: Major flow voids are preserved. Skull and upper cervical spine: Normal marrow signal. Sinuses/Orbits: Partial right mastoid opacification IMPRESSION: Small acute infarcts in the right more than left cerebral white matter. 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  Electronically Signed   By: Ronnette Coke M.D.   On: 11/04/2023 10:54   CT HEAD WO CONTRAST ( ) Result Date: 11/02/2023 CLINICAL DATA:  Subdural hematoma EXAM: CT HEAD WITHOUT CONTRAST TECHNIQUE: Contiguous axial images were obtained from the base of  the skull through the vertex without intravenous contrast. RADIATION DOSE REDUCTION: This exam was performed according to the departmental dose-optimization program which includes automated exposure control, adjustment of the mA and/or kV according to patient size and/or use of iterative reconstruction technique. COMPARISON:  Four days prior FINDINGS: Brain: Patchy subarachnoid hemorrhage along the cerebral convexities is non progressed. Hemorrhagic contusions in the anterior temporal and inferior frontal lobes on the right and in the left cerebellum. The inferior right frontal hematoma measures up to 2.9 cm. The left cerebellar hematoma measures 2.3 cm. Expected regional edema. Mixed density subdural hematoma along the left frontal convexity measures up to 5 mm in thickness without worrisome mass effect. No hydrocephalus, or shift. Perforator infarct in the right corona radiata Vascular: No hyperdense vessel or unexpected calcification. Skull: Nondepressed posterior left calvarium fracture with overlying soft tissue gas. Partial right mastoid opacification in the setting of intubation. No superimposed fracture seen. Sinuses/Orbits: Negative IMPRESSION: No progression of multifocal ICH as described. No mass effect or hydrocephalus. Perforator infarct in the right corona radiata that has occurred since admission. Electronically Signed   By: Ronnette Coke M.D.   On: 11/02/2023 04:05   IR IVC FILTER PLMT / S&I Dan Dun GUID/MOD SED Result Date: 10/31/2023 CLINICAL DATA:  Intracranial hemorrhage and acute right peroneal vein DVT. EXAM: 1. ULTRASOUND GUIDANCE FOR VASCULAR ACCESS OF THE RIGHT INTERNAL JUGULAR VEIN. 2. IVC VENOGRAM. 3. PERCUTANEOUS IVC FILTER  PLACEMENT. ANESTHESIA/SEDATION: None CONTRAST:  30mL OMNIPAQUE  IOHEXOL  300 MG/ML  SOLN FLUOROSCOPY: Radiation Exposure Index: 17 mGy Kerma PROCEDURE: The procedure, risks, benefits, and alternatives were explained to the patient's husband. Questions regarding the procedure were encouraged and answered. The patient's husband understands and consents to the procedure. A time-out was performed prior to initiating the procedure. The right neck was prepped with chlorhexidine  in a sterile fashion, and a sterile drape was applied covering the operative field. A sterile gown and sterile gloves were used for the procedure. Local anesthesia was provided with 1% Lidocaine . Ultrasound was utilized to confirm patency of the right internal jugular vein. An ultrasound image was saved and recorded. Under direct ultrasound guidance, a 21 gauge needle was advanced into the right internal jugular vein with ultrasound image documentation performed. After securing access with a micropuncture dilator, a guidewire was advanced into the inferior vena cava. A deployment sheath was advanced over the guidewire. This was utilized to perform IVC venography. The deployment sheath was further positioned in an appropriate location for filter deployment. A Bard Denali IVC filter was then advanced in the sheath. This was then fully deployed in the infrarenal IVC. Final filter position was confirmed with a fluoroscopic spot image. After the procedure the sheath was removed and hemostasis obtained with manual compression. COMPLICATIONS: None. FINDINGS: IVC venography demonstrates a normal caliber IVC with no evidence of thrombus. Renal veins are identified bilaterally. The IVC filter was successfully positioned below the level of the renal veins and is appropriately oriented. This IVC filter has both permanent and retrievable indications. IMPRESSION: Placement of percutaneous IVC filter in infrarenal IVC. IVC venogram shows no evidence of IVC thrombus  and normal caliber of the inferior vena cava. This filter does have both permanent and retrievable indications. PLAN: This IVC filter is potentially retrievable. The patient will be assessed for filter retrieval by Interventional Radiology in approximately 8-12 weeks. Further recommendations regarding filter retrieval, continued surveillance or declaration of device permanence, will be made at that time. Electronically Signed   By: Georganne Kind.D.  On: 10/31/2023 17:35   VAS US  LOWER EXTREMITY VENOUS (DVT) Result Date: 10/31/2023  Lower Venous DVT Study Patient Name:  Miranda Cochran  Date of Exam:   10/31/2023 Medical Rec #: [id number]      Accession #:    [id number] Date of Birth: 08/05/1950     Patient Gender: F Patient Age:   67 years Exam Location:  [facility] Procedure:      VAS US  LOWER EXTREMITY VENOUS (DVT) Referring Phys: Phyllis Breeze --------------------------------------------------------------------------------  Indications: Edema. Other Indications: Status post fall with resulting subdural and subarachnoid                    hemorrhage and also occipital skull fracture with large scalp                    hematoma. Comparison Study: No prior LEV on file Performing Technologist: Carleene Chase RVS  Examination Guidelines: A complete evaluation includes B-mode imaging, spectral Doppler, color Doppler, and power Doppler as needed of all accessible portions of each vessel. Bilateral testing is considered an integral part of a complete examination. Limited examinations for reoccurring indications may be performed as noted. The reflux portion of the exam is performed with the patient in reverse Trendelenburg.  +---------+---------------+---------+-----------+----------+--------------+ RIGHT    CompressibilityPhasicitySpontaneityPropertiesThrombus Aging +---------+---------------+---------+-----------+----------+--------------+ CFV      Full           Yes      Yes                                  +---------+---------------+---------+-----------+----------+--------------+ SFJ      Full                                                        +---------+---------------+---------+-----------+----------+--------------+ FV Prox  Full                                                        +---------+---------------+---------+-----------+----------+--------------+ FV Mid   Full                                                        +---------+---------------+---------+-----------+----------+--------------+ FV DistalFull                                                        +---------+---------------+---------+-----------+----------+--------------+ PFV      Full                                                        +---------+---------------+---------+-----------+----------+--------------+ POP      Full  Yes      Yes                                 +---------+---------------+---------+-----------+----------+--------------+ PTV      Full                                                        +---------+---------------+---------+-----------+----------+--------------+ PERO     None                                         Acute          +---------+---------------+---------+-----------+----------+--------------+   +---------+---------------+---------+-----------+----------+-------------------+ LEFT     CompressibilityPhasicitySpontaneityPropertiesThrombus Aging      +---------+---------------+---------+-----------+----------+-------------------+ CFV      Full           Yes      Yes                                      +---------+---------------+---------+-----------+----------+-------------------+ SFJ      Full                                                             +---------+---------------+---------+-----------+----------+-------------------+ FV Prox  Full                                                              +---------+---------------+---------+-----------+----------+-------------------+ FV Mid   Full           Yes      Yes                                      +---------+---------------+---------+-----------+----------+-------------------+ FV DistalFull                                                             +---------+---------------+---------+-----------+----------+-------------------+ PFV      Full                                                             +---------+---------------+---------+-----------+----------+-------------------+ POP      Full           Yes      Yes                                      +---------+---------------+---------+-----------+----------+-------------------+  PTV      Full                                                             +---------+---------------+---------+-----------+----------+-------------------+ PERO                                                  Not well visualized +---------+---------------+---------+-----------+----------+-------------------+     Summary: RIGHT: - Findings consistent with acute deep vein thrombosis involving the right peroneal veins.   LEFT: - No evidence of deep vein thrombosis in the lower extremity. No indirect evidence of obstruction proximal to the inguinal ligament.   *See table(s) above for measurements and observations. Electronically signed by Runell Countryman on 10/31/2023 at 3:44:13 PM.    Final    CT Angio Chest Pulmonary Embolism (PE) W or WO Contrast Result Date: 10/31/2023 CLINICAL DATA:  Pulmonary embolism. EXAM: CT ANGIOGRAPHY CHEST WITH CONTRAST TECHNIQUE: Multidetector CT imaging of the chest was performed using the standard protocol during bolus administration of intravenous contrast. Multiplanar CT image reconstructions and MIPs were obtained to evaluate the vascular anatomy. RADIATION DOSE REDUCTION: This exam was performed according to the departmental dose-optimization  program which includes automated exposure control, adjustment of the mA and/or kV according to patient size and/or use of iterative reconstruction technique. CONTRAST:  67mL OMNIPAQUE  IOHEXOL  350 MG/ML SOLN COMPARISON:  CT dated 10/26/2023. FINDINGS: Cardiovascular: There is no cardiomegaly or pericardial effusion. There is coronary vascular calcification of the LAD. There is calcification of the aortic valve. Mild atherosclerotic calcification of the thoracic aorta. No aneurysmal dilatation or dissection. Evaluation of the pulmonary arteries is limited due to respiratory motion. No central pulmonary artery embolus identified. Mediastinum/Nodes: No hilar or mediastinal adenopathy. An enteric tube noted in the esophagus extending into the stomach. No mediastinal fluid collection. Lungs/Pleura: Large area of consolidation involving the left lower lobe with air bronchograms. There is a small left pleural effusion. Small areas of subpleural densities in the right lower lobe and lingula may represent atelectasis or pneumonia. There is no pneumothorax. Endotracheal tube approximately 3 cm above the carina. The central airways are patent. Upper Abdomen: Cirrhosis. Musculoskeletal: Osteopenia with degenerative changes spine. Compression fractures of the inferior T9 and superior T10 endplates as seen on the prior CT. No acute osseous pathology. Review of the MIP images confirms the above findings. IMPRESSION: 1. No CT evidence of central pulmonary artery embolus. 2. Left lower lobe pneumonia.  Follow-up to resolution recommended. 3. Cirrhosis. 4.  Aortic Atherosclerosis (ICD10-I70.0). Electronically Signed   By: Angus Bark M.D.   On: 10/31/2023 15:01   DG Chest Port 1 View Result Date: 10/31/2023 CLINICAL DATA:  Acute respiratory failure. EXAM: PORTABLE CHEST 1 VIEW COMPARISON:  10/29/2023 FINDINGS: Endotracheal tube in satisfactory position. Left jugular catheter tip in the superior vena cava. Stable dense left  lower lobe airspace opacity. Interval small amount of linear atelectasis in the left mid lung zone. Clear right lung. Nasogastric tube extending into the stomach. Mild scoliosis. IMPRESSION: 1. Stable dense left lower lobe atelectasis or pneumonia. 2. Interval small amount of linear atelectasis in the left mid lung zone. 3. Support apparatus in satisfactory  position. Electronically Signed   By: Catherin Closs M.D.   On: 10/31/2023 11:40   CT HEAD WO CONTRAST ( ) Result Date: 10/29/2023 CLINICAL DATA:  72 year old female with multifocal intracranial hemorrhage status post fall. EXAM: CT HEAD WITHOUT CONTRAST TECHNIQUE: Contiguous axial images were obtained from the base of the skull through the vertex without intravenous contrast. RADIATION DOSE REDUCTION: This exam was performed according to the departmental dose-optimization program which includes automated exposure control, adjustment of the mA and/or kV according to patient size and/or use of iterative reconstruction technique. COMPARISON:  Head CT yesterday and earlier. FINDINGS: Brain: There remains no midline shift. Confluent hemorrhagic contusions in the right anterior and inferior frontal and right anterior and lateral temporal lobes have not significantly changed. Small volume bilateral subarachnoid hemorrhage and questionable smaller left inferior temporal lobe hemorrhagic contusion (versus age coronal image 40) are stable. Superimposed hyperdense right anterior convexity small subdural hematoma and more broad-based but generally low-density left side subdural hematoma (4 mm) are stable. Confluent left inferior cerebellar hemorrhagic contusion with edema is stable. Basilar cisterns remain patent. Stable gray-white matter differentiation throughout the brain. No cortically based acute infarct identified. Vascular: Calcified atherosclerosis at the skull base. Skull: Stable nondisplaced left posterior convexity skull fracture extending to the left foramen  magnum. Sinuses/Orbits: Visualized paranasal sinuses and mastoids are stable and well aerated. Other: Intubated and oral enteric tube partially visible. Left posterior convexity, right lateral convexity scalp hematomas which are not significantly changed. Orbits soft tissues appears stable and negative. Scalp EEG leads remain. IMPRESSION: 1. Stable extensive multifocal posttraumatic intracranial hemorrhage since yesterday. No midline shift. Basilar cisterns remain patent. 2. No new intracranial abnormality. 3. Stable nondisplaced left posterior skull fracture. Scalp hematomas. Electronically Signed   By: Marlise Simpers M.D.   On: 10/29/2023 09:24   DG Chest Port 1 View Result Date: 10/29/2023 CLINICAL DATA:  5626. Acute respiratory failure, ventilator dependent. EXAM: PORTABLE CHEST 1 VIEW COMPARISON:  Portable chest yesterday at 8:13 a.m. FINDINGS: 4:13 a.m. ETT tip is 5.4 cm from the carina, previously 4.1 cm. Left IJ line terminates at the superior cavoatrial junction. NGT enters the stomach with the intragastric course not evaluated. The heart is slightly enlarged. No vascular congestion is seen. Stable mediastinum with aortic atherosclerosis. Dense left lower lobe consolidation continues to be seen and there are bilateral minimal pleural effusions. The lungs are otherwise clear. Overall aeration seems unchanged. Osteopenia and thoracic spondylosis. IMPRESSION: 1. ETT tip 5.4 cm from the carina, previously 4.1 cm. 2. Left IJ line terminates at the superior cavoatrial junction. 3. NGT enters the stomach with the intragastric course not evaluated. 4. Dense left lower lobe consolidation and bilateral minimal pleural effusions. 5. Aortic atherosclerosis. Electronically Signed   By: Denman Fischer M.D.   On: 10/29/2023 04:46   CT HEAD WO CONTRAST ( ) Result Date: 10/28/2023 CLINICAL DATA:  73 year old female with multifocal intracranial hemorrhage, status post fall. EXAM: CT HEAD WITHOUT CONTRAST TECHNIQUE:  Contiguous axial images were obtained from the base of the skull through the vertex without intravenous contrast. RADIATION DOSE REDUCTION: This exam was performed according to the departmental dose-optimization program which includes automated exposure control, adjustment of the mA and/or kV according to patient size and/or use of iterative reconstruction technique. COMPARISON:  Head CTs yesterday and earlier. FINDINGS: Brain: Right side anterior frontal convexity predominant mostly hyperdense subdural hematoma is 5-6 mm, stable. Superimposed confluent right inferior frontal gyrus and right anterior temporal lobe hemorrhagic contusions, in an area of slightly  larger than 4 cm, stable. Surrounding edema stable. Bilateral subarachnoid hemorrhage, most apparent along the sylvian fissures, asymmetrically greater on the right, stable. Left inferolateral cerebellar hemorrhagic contusion which is about 2.5 cm, stable. Small volume hyperdense blood layering on the tentorium, stable. A smaller predominantly low-density left side subdural hematoma measuring up to 4 mm is new since presentation and increased since yesterday, most apparent on coronal image 47. Small volume IVH limited to the 4th ventricle is stable. No ventriculomegaly. No significant midline shift. Basilar cisterns remain patent. Stable gray-white matter differentiation throughout the brain. No cortically based acute infarct identified. Vascular: Calcified atherosclerosis at the skull base. No suspicious intracranial vascular hyperdensity. Skull: Linear nondisplaced skull fracture tracking lateral to the left lambdoid suture from the inferior parietal bone region through to the left foramen magnum. No new osseous abnormality identified. Sinuses/Orbits: Visualized paranasal sinuses and mastoids are stable and well aerated. Other: Large, broad-based left posterior convexity scalp hematoma does not appear significantly changed. Underlying nondisplaced skull  fracture. Orbits soft tissues appears stable.  Bilateral scalp EEG leads now. IMPRESSION: 1. Extensive multifocal posttraumatic intracranial hemorrhage, stable since yesterday with EXCEPT FOR: Small 4 mm mixed but predominantly low-density left Subdural Hematoma has increased (coronal image 46). 2. No midline shift or significant intracranial mass effect despite the findings. No ventriculomegaly. Stable basilar cisterns. 3. Nondisplaced left posterior convexity skull fracture with large overlying scalp hematoma. Electronically Signed   By: Marlise Simpers M.D.   On: 10/28/2023 10:02   DG CHEST PORT 1 VIEW Result Date: 10/28/2023 CLINICAL DATA:  [id number] Endotracheal tube present [id number] EXAM: PORTABLE CHEST 1 VIEW COMPARISON:  10/28/2023, 6:19 a.m. FINDINGS: Re-demonstration of left retrocardiac airspace opacity obscuring the left hemidiaphragm, descending thoracic aorta and blunting the left lateral costophrenic angle, suggesting combination of left lung atelectasis and/or consolidation with pleural effusion. No significant interval change. Bilateral lung fields are clear. Bilateral costophrenic angles are clear. Stable cardio-mediastinal silhouette. No acute osseous abnormalities. The soft tissues are within normal limits. Tube/lines: Endotracheal tube is seen with its tip approximately 4.1 cm above the carina. Enteric tube is seen coursing below the left hemidiaphragm however, the tip and side hole are not included in the film. Left IJ central venous catheter noted with its tip overlying the cavoatrial junction region. IMPRESSION: 1. Repositioning of the endotracheal tube. 2. Otherwise, no significant interval change since the prior study. Stable left retrocardiac opacity, as described above. 3. Support apparatus as described above. Electronically Signed   By: Beula Brunswick M.D.   On: 10/28/2023 08:27   DG Abd Portable 1V Result Date: 10/28/2023 CLINICAL DATA:  [id number] Encounter for orogastric (OG) tube placement  [id number]. EXAM: PORTABLE ABDOMEN - 1 VIEW COMPARISON:  None Available. FINDINGS: The bowel gas pattern is non-obstructive. No evidence of pneumoperitoneum, within the limitations of a supine film. No acute osseous abnormalities. The soft tissues are within normal limits. Surgical changes, devices, tubes and lines: Enteric tube is seen coursing below the left hemidiaphragm with its side hole overlying the left side of the lumbar spine (stomach body region) and tip overlying the right lower quadrant, overlying the region of pylorus of the stomach. IMPRESSION: *Enteric tube is seen coursing below the left hemidiaphragm with its side hole overlying the left side of the lumbar spine (stomach body region) and tip overlying the right lower quadrant, overlying the region of pylorus of the stomach. Electronically Signed   By: Beula Brunswick M.D.   On: 10/28/2023 08:25   DG Chest [facility]  1 View Addendum Date: 10/28/2023 ADDENDUM REPORT: 10/28/2023 07:19 ADDENDUM: Critical Value/emergent results were called by telephone at the time of interpretation on 10/28/2023 at 7:18 am to provider Dr. Waylan Haggard, who verbally acknowledged these results. Electronically Signed   By: Donnal Fusi M.D.   On: 10/28/2023 07:19   Result Date: 10/28/2023 CLINICAL DATA:  Acute intracranial hemorrhage. Ventilator dependence. EXAM: PORTABLE CHEST 1 VIEW COMPARISON:  Earlier same day. FINDINGS: Left base collapse/consolidation with small left pleural effusion is similar to prior. Endotracheal tube tip is in the right mainstem bronchus, about 17 mm below the bottom of the carina. Endotracheal tube could be retracted approximately 4 cm to place the tip in the distal esophagus. Left IJ central line tip overlies the distal SVC level. The NG tube passes into the stomach although the distal tip position is not included on the film. Telemetry leads overlie the chest. IMPRESSION: 1. Endotracheal tube tip is in the right mainstem bronchus, about 17 mm below the  bottom of the carina. Endotracheal tube could be retracted approximately 4 cm to place the tip in the distal esophagus. Repeat chest x-ray recommended after endotracheal tube repositioning. 2. Left base collapse/consolidation with small left pleural effusion, similar to prior. Electronically Signed: By: Donnal Fusi M.D. On: 10/28/2023 06:49   Overnight EEG with video Result Date: 10/28/2023 Arleene Lack, MD     10/29/2023  6:46 AM Patient Name: Miranda Cochran MRN: [id number] Epilepsy Attending: Arleene Lack Referring Physician/Provider: Josiah Nigh, MD Duration: 10/27/2023 1319 to 10/28/2023 1319 Patient history: 72yo F with right occipital fracture with acute subarachnoid hemorrhage, multiple contusions and a right subdural hematoma. EEG to evaluate for seizure Level of alertness: comatose/ lethargic AEDs during EEG study: LEV, propofol  Technical aspects: This EEG study was done with scalp electrodes positioned according to the 10-20 International system of electrode placement. Electrical activity was reviewed with band pass filter of 1-70Hz , sensitivity of 7 uV/mm, display speed of 40mm/sec with a 60Hz  notched filter applied as appropriate. EEG data were recorded continuously and digitally stored.  Video monitoring was available and reviewed as appropriate. Description: EEG showed continuous generalized and lateralized right hemisphere 3 to 6 Hz theta-delta slowing with overriding 12-14hz  beta activity. Hyperventilation and photic stimulation were not performed.   ABNORMALITY - Continuous slow, generalized and lateralized right hemisphere IMPRESSION: This study is suggestive of cortical dysfunction arising from right hemisphere likely secondary to underlying structural abnormality. Additionally there is moderate to severe diffuse encephalopathy likely related to sedation. No seizures or epileptiform discharges were seen throughout the recording. Priyanka Suzanne Erps   CT HEAD POST STROKE  FOLLOWUP/TIMED/STAT READ Result Date: 10/27/2023 CLINICAL DATA:  Neuro deficit, acute, stroke suspected. EXAM: CT HEAD WITHOUT CONTRAST TECHNIQUE: Contiguous axial images were obtained from the base of the skull through the vertex without intravenous contrast. RADIATION DOSE REDUCTION: This exam was performed according to the departmental dose-optimization program which includes automated exposure control, adjustment of the mA and/or kV according to patient size and/or use of iterative reconstruction technique. COMPARISON:  Prior head CT examinations 10/27/2023 and earlier. FINDINGS: Brain: An acute subdural hematoma along the right cerebral convexity has undergone some interval redistribution but has not significantly changed in overall extent. The hematoma now measures up to 8 mm in thickness, and as before, is greatest along the right frontal lobe. Continued slight interval increase in extent of acute subarachnoid hemorrhage scattered along the right cerebral convexity. Progressive edema at sites of hemorrhagic parenchymal contusions within the  anteroinferior right frontal lobe and anterior right temporal lobe. Small-volume subarachnoid hemorrhage scattered along the left cerebral convexity has mildly increased. Possible small underlying hemorrhagic parenchymal contusions within the anterolateral left temporal lobe, as before. Thin acute subdural hemorrhage layering along the tentorium bilaterally, unchanged. 2.3 x 2.6 cm acute parenchymal hemorrhage within the left cerebellar hemisphere, unchanged in size. Trace subarachnoid hemorrhage again questioned at the inferior aspect of the fourth ventricle (versus choroid plexus) (for instance as seen on series 5, image 28). No midline shift or hydrocephalus. Vascular: No hyperdense vessel.  Atherosclerotic calcifications. Skull: Known non-depressed fracture of the left occipital calvarium. Sinuses/Orbits: No mass or acute finding within the imaged orbits. Minimal  mucosal thickening within the bilateral maxillary sinuses at the imaged levels. Minimal mucosal thickening within the bilateral sphenoid and right ethmoid sinuses. Other: Posterior scalp hematomas. IMPRESSION: 1. An acute subdural hematoma along the right cerebral convexity has undergone some interval redistribution but has not significantly changed in overall extent. 2. Continued slight interval increase in extent of acute subarachnoid hemorrhage scattered along the right cerebral convexity. 3. Progressive edema at sites of hemorrhagic parenchymal contusions within the anteroinferior right frontal lobe and anterior right temporal lobe. 4. Small-volume subarachnoid hemorrhage scattered along the left cerebral convexity has mildly increased. Possible small underlying hemorrhagic parenchymal contusions within the anterolateral left temporal lobe, as before. 5. Thin acute subdural hemorrhage layering along the tentorium bilaterally, unchanged. 6. 2.3 x 2.6 cm acute parenchymal hemorrhage within the left cerebellar hemisphere, unchanged. 7. Trace subarachnoid hemorrhage again questioned at the inferior aspect of the fourth ventricle. 8. No midline shift or hydrocephalus. 9. Known non-depressed fracture of the left occipital calvarium. Electronically Signed   By: Bascom Lily D.O.   On: 10/27/2023 16:39   ECHOCARDIOGRAM COMPLETE Result Date: 10/27/2023    ECHOCARDIOGRAM REPORT   Patient Name:   KEMARIA DEDIC Date of Exam: 10/27/2023 Medical Rec #:  [id number]     Height:       64.0 in Accession #:    [id number]    Weight:       121.3 lb Date of Birth:  1951/05/16    BSA:          1.582 m Patient Age:    72 years      BP:           186/91 mmHg Patient Gender: F             HR:           103 bpm. Exam Location:  Inpatient Procedure: 2D Echo, Cardiac Doppler and Color Doppler (Both Spectral and Color            Flow Doppler were utilized during procedure). Indications:    R55 Syncope  History:        Patient has prior  history of Echocardiogram examinations, most                 recent 11/11/2020. Stroke; Risk Factors:Hypertension,                 Dyslipidemia and Diabetes. SDH.  Sonographer:    Raynelle Callow RDCS Referring Phys: [phone] Tania Familia [facility]  Sonographer Comments: Technically difficult study due to poor echo windows and echo performed with patient supine and on artificial respirator. Patient with EEG adhesive leads in parasternal region. Patient intubated, restrained and moving throughout exam. IMPRESSIONS  1. Left ventricular ejection fraction, by estimation, is 65 to 70%. The left ventricle has normal  function. The left ventricle has no regional wall motion abnormalities. There is severe left ventricular hypertrophy of the basal-septal segment. Left ventricular diastolic parameters are indeterminate. Elevated left atrial pressure.  2. Right ventricular systolic function is normal. The right ventricular size is normal. There is normal pulmonary artery systolic pressure. The estimated right ventricular systolic pressure is 28.1 mmHg.  3. The mitral valve is normal in structure. Trivial mitral valve regurgitation. No evidence of mitral stenosis.  4. The aortic valve has an indeterminant number of cusps. There is moderate calcification of the aortic valve. There is moderate thickening of the aortic valve. Aortic valve regurgitation is not visualized. Moderate aortic valve stenosis. Aortic valve area, by VTI measures 1.10 cm. Aortic valve mean gradient measures 32.0 mmHg. Aortic valve Vmax measures 3.26 m/s.  5. The inferior vena cava is dilated in size with >50% respiratory variability, suggesting right atrial pressure of 8 mmHg. FINDINGS  Left Ventricle: Left ventricular ejection fraction, by estimation, is 65 to 70%. The left ventricle has normal function. The left ventricle has no regional wall motion abnormalities. The left ventricular internal cavity size was normal in size. There is  severe left ventricular  hypertrophy of the basal-septal segment. Left ventricular diastolic parameters are indeterminate. Elevated left atrial pressure. Right Ventricle: The right ventricular size is normal. No increase in right ventricular wall thickness. Right ventricular systolic function is normal. There is normal pulmonary artery systolic pressure. The tricuspid regurgitant velocity is 2.24 m/s, and  with an assumed right atrial pressure of 8 mmHg, the estimated right ventricular systolic pressure is 28.1 mmHg. Left Atrium: Left atrial size was normal in size. Right Atrium: Right atrial size was normal in size. Pericardium: There is no evidence of pericardial effusion. Mitral Valve: The mitral valve is normal in structure. Mild to moderate mitral annular calcification. Trivial mitral valve regurgitation. No evidence of mitral valve stenosis. MV peak gradient, 6.2 mmHg. The mean mitral valve gradient is 3.0 mmHg. Tricuspid Valve: The tricuspid valve is normal in structure. Tricuspid valve regurgitation is not demonstrated. No evidence of tricuspid stenosis. Aortic Valve: The aortic valve has an indeterminant number of cusps. There is moderate calcification of the aortic valve. There is moderate thickening of the aortic valve. Aortic valve regurgitation is not visualized. Moderate aortic stenosis is present.  Aortic valve mean gradient measures 32.0 mmHg. Aortic valve peak gradient measures 42.6 mmHg. Aortic valve area, by VTI measures 1.10 cm. Pulmonic Valve: The pulmonic valve was normal in structure. Pulmonic valve regurgitation is not visualized. No evidence of pulmonic stenosis. Aorta: The aortic root is normal in size and structure. Venous: The inferior vena cava is dilated in size with greater than 50% respiratory variability, suggesting right atrial pressure of 8 mmHg. IAS/Shunts: No atrial level shunt detected by color flow Doppler.  LEFT VENTRICLE PLAX 2D LVIDd:         3.70 cm     Diastology LVIDs:         2.40 cm     LV e'  medial:    4.57 cm/s LV PW:         0.70 cm     LV E/e' medial:  20.0 LV IVS:        1.80 cm     LV e' lateral:   5.66 cm/s LVOT diam:     2.10 cm     LV E/e' lateral: 16.2 LV SV:         60 LV SV Index:  38 LVOT Area:     3.46 cm  LV Volumes (MOD) LV vol d, MOD A2C: 77.1 ml LV vol d, MOD A4C: 68.4 ml LV vol s, MOD A2C: 21.9 ml LV vol s, MOD A4C: 25.9 ml LV SV MOD A2C:     55.2 ml LV SV MOD A4C:     68.4 ml LV SV MOD BP:      53.0 ml RIGHT VENTRICLE             IVC RV S prime:     14.70 cm/s  IVC diam: 2.10 cm TAPSE (M-mode): 2.1 cm LEFT ATRIUM             Index        RIGHT ATRIUM           Index LA diam:        3.30 cm 2.09 cm/m   RA Area:     10.80 cm LA Vol (A2C):   21.5 ml 13.59 ml/m  RA Volume:   21.60 ml  13.66 ml/m LA Vol (A4C):   40.5 ml 25.61 ml/m LA Biplane Vol: 30.7 ml 19.41 ml/m  AORTIC VALVE AV Area (Vmax):    1.18 cm AV Area (Vmean):   1.09 cm AV Area (VTI):     1.10 cm AV Vmax:           326.25 cm/s AV Vmean:          232.750 cm/s AV VTI:            0.543 m AV Peak Grad:      42.6 mmHg AV Mean Grad:      32.0 mmHg LVOT Vmax:         111.00 cm/s LVOT Vmean:        73.100 cm/s LVOT VTI:          0.172 m LVOT/AV VTI ratio: 0.32  AORTA Ao Root diam: 3.50 cm Ao Asc diam:  3.45 cm MITRAL VALVE                TRICUSPID VALVE MV Area (PHT): 4.37 cm     TR Peak grad:   20.1 mmHg MV Area VTI:   2.64 cm     TR Vmax:        224.00 cm/s MV Peak grad:  6.2 mmHg MV Mean grad:  3.0 mmHg     SHUNTS MV Vmax:       1.25 m/s     Systemic VTI:  0.17 m MV Vmean:      79.1 cm/s    Systemic Diam: 2.10 cm MV Decel Time: 174 msec MV E velocity: 91.50 cm/s MV A velocity: 113.67 cm/s MV E/A ratio:  0.80 Gaylyn Keas MD Electronically signed by Gaylyn Keas MD Signature Date/Time: 10/27/2023/3:21:35 PM    Final    DG Abdomen 1 View Result Date: 10/27/2023 CLINICAL DATA:  OG tube placement EXAM: ABDOMEN - 1 VIEW COMPARISON:  Oct 26, 2023 FINDINGS: Esophagogastric tube terminates in the stomach. Nonobstructive bowel  gas pattern. No pneumoperitoneum. No organomegaly or radiopaque calculi. Multilevel thoracic osteophytosis. Atherosclerosis. IMPRESSION: Well-positioned esophagogastric tube terminates in the stomach. Electronically Signed   By: Rance Burrows M.D.   On: 10/27/2023 12:04   DG Chest Portable 1 View Result Date: 10/27/2023 CLINICAL DATA:  ETT tube placement EXAM: PORTABLE CHEST - 1 VIEW COMPARISON:  Oct 26, 2023 FINDINGS: Suboptimal evaluation of the endotracheal tube due to overlying cardiac wires. It appears that the  tip of the endotracheal tube is within the ostium of the right mainstem bronchus. Left IJ approach central venous catheter terminates at the cavoatrial junction. Esophagogastric tube courses below the diaphragm with the distal tip not included in the field of view. The last side hole overlies the expected region of the stomach. Trace left pleural effusion. Streaky left basilar atelectasis. No pneumothorax. No cardiomegaly. Tortuous aorta with aortic atherosclerosis. IMPRESSION: Low lying endotracheal tube, which is suboptimally evaluated due to overlying defibrillator pad and cardiac wires. It appears that the tip of the endotracheal tube is within the ostium of the right mainstem bronchus. Repeat chest radiograph recommended for further characterization after 2 cm of retraction. Well-positioned left IJ central venous catheter.  No pneumothorax. Electronically Signed   By: Rance Burrows M.D.   On: 10/27/2023 12:03   CT HEAD WO CONTRAST ( ) Addendum Date: 10/27/2023 ADDENDUM REPORT: 10/27/2023 09:03 ADDENDUM: These results were called by telephone at the time of interpretation on 10/27/2023 at 9:03 am to provider Dr. Nichole Barker, who verbally acknowledged these results. Electronically Signed   By: Bascom Lily D.O.   On: 10/27/2023 09:03   Result Date: 10/27/2023 CLINICAL DATA:  Head trauma, intracranial arterial injury suspected. EXAM: CT HEAD WITHOUT CONTRAST TECHNIQUE: Contiguous axial images  were obtained from the base of the skull through the vertex without intravenous contrast. RADIATION DOSE REDUCTION: This exam was performed according to the departmental dose-optimization program which includes automated exposure control, adjustment of the mA and/or kV according to patient size and/or use of iterative reconstruction technique. COMPARISON:  Head CT 10/26/2023. FINDINGS: Brain: Interval increase in size of an acute subdural hematoma along the right cerebral convexity (greatest along the anterior right frontal lobe), now measuring up to 9 mm in thickness (previously 7 mm). Acute subarachnoid hemorrhage scattered along the right cerebral convexity, increased. Hemorrhagic parenchymal contusions within the anterior right temporal lobe and anteroinferior right frontal lobe have increased in size. For instance, the largest hemorrhagic parenchymal contusion within the anterior right temporal lobe now measures 18 mm (previously 11 mm) (series 2, image 16). Trace acute subarachnoid hemorrhage is present along the anterolateral left temporal lobe, new from the prior exam (series 2, image 15). Possible small underlying left temporal lobe hemorrhagic parenchymal contusions. Thin subdural hemorrhage layering along the tentorium, bilaterally, similar to the prior exam. A 2.3 x 2.6 cm hemorrhagic parenchymal contusion within the left cerebellar hemisphere has increased in size (previously 2.1 x 1.8 cm). Trace subarachnoid hemorrhage questioned within the inferior aspect of the fourth ventricle (versus prominent choroid plexus). No midline shift or hydrocephalus. Vascular: No hyperdense vessel.  Atherosclerotic calcifications. Skull: Non-depressed fracture of the left occipital calvarium again noted. Sinuses/Orbits: No orbital mass or acute orbital finding. Mild mucosal thickening within the right ethmoid and left maxillary sinuses. Other: Left occipital scalp hematoma and laceration. Attempts are being made to reach  the ordering provider at this time. IMPRESSION: 1. Interval increase in size of an acute subdural hematoma along the right cerebral convexity (greatest along the anterior right frontal lobe), now measuring up to 9 mm in thickness (previously 7 mm). 2. Acute subarachnoid hemorrhage scattered along the right cerebral convexity, increased. 3. Hemorrhagic parenchymal contusions within the anterior right temporal lobe and anteroinferior right frontal lobe have increased in size. 4. Trace acute subarachnoid hemorrhage along the anterolateral left temporal lobe, new from the prior exam. Possible small underlying left temporal lobe hemorrhagic parenchymal contusions. 5. A 2.3 x 2.6 cm hemorrhagic parenchymal contusion within the left  cerebellar hemisphere has increased in size (previously 2.1 x 1.8 cm). 6. Thin subdural hemorrhage layering along the tentorium, bilaterally, similar to the prior exam. 7. Trace subarachnoid hemorrhage (versus prominent choroid plexus) within the inferior aspect of the fourth ventricle. 8. Non-depressed fracture of the left occipital calvarium. 9. Left occipital scalp hematoma and laceration. Electronically Signed: By: Bascom Lily D.O. On: 10/27/2023 08:44   CT HEAD WO CONTRAST Result Date: 10/26/2023 CLINICAL DATA:  Fall EXAM: CT HEAD WITHOUT CONTRAST CT CERVICAL SPINE WITHOUT CONTRAST TECHNIQUE: Multidetector CT imaging of the head and cervical spine was performed following the standard protocol without intravenous contrast. Multiplanar CT image reconstructions of the cervical spine were also generated. RADIATION DOSE REDUCTION: This exam was performed according to the departmental dose-optimization program which includes automated exposure control, adjustment of the mA and/or kV according to patient size and/or use of iterative reconstruction technique. COMPARISON:  None Available. FINDINGS: CT HEAD FINDINGS Brain: There is mixed subdural and subarachnoid hemorrhage of the right  hemisphere, predominantly anterior. There is a small amount of subdural blood layering along the falx cerebrum. No midline shift or other mass effect. No hydrocephalus. Vascular: Atherosclerotic calcification of the vertebral and internal carotid arteries at the skull base. No abnormal hyperdensity of the major intracranial arteries or dural venous sinuses. Skull: Large left posterior scalp hematoma. Nondisplaced left occipital skull fracture. Sinuses/Orbits: No fluid levels or advanced mucosal thickening of the visualized paranasal sinuses. No mastoid or middle ear effusion. Normal orbits. Other: None. CT CERVICAL SPINE FINDINGS Alignment: Grade 1 anterolisthesis at C4-5. Skull base and vertebrae: No acute fracture. Soft tissues and spinal canal: No prevertebral fluid or swelling. No visible canal hematoma. Disc levels: No advanced spinal canal or neural foraminal stenosis. Upper chest: No pneumothorax, pulmonary nodule or pleural effusion. Other: Normal visualized paraspinal cervical soft tissues. IMPRESSION: 1. Mixed subdural and subarachnoid hemorrhage of the right hemisphere, predominantly anterior. No midline shift or other mass effect. 2. Nondisplaced left occipital skull fracture with large left posterior scalp hematoma. 3. No acute fracture or static subluxation of the cervical spine. Critical Value/emergent results were called by telephone at the time of interpretation on 10/26/2023 at 11:50 pm to provider JONATHAN GILLIAM , who verbally acknowledged these results. Electronically Signed   By: Juanetta Nordmann M.D.   On: 10/26/2023 23:51   CT CERVICAL SPINE WO CONTRAST Result Date: 10/26/2023 CLINICAL DATA:  Fall EXAM: CT HEAD WITHOUT CONTRAST CT CERVICAL SPINE WITHOUT CONTRAST TECHNIQUE: Multidetector CT imaging of the head and cervical spine was performed following the standard protocol without intravenous contrast. Multiplanar CT image reconstructions of the cervical spine were also generated. RADIATION  DOSE REDUCTION: This exam was performed according to the departmental dose-optimization program which includes automated exposure control, adjustment of the mA and/or kV according to patient size and/or use of iterative reconstruction technique. COMPARISON:  None Available. FINDINGS: CT HEAD FINDINGS Brain: There is mixed subdural and subarachnoid hemorrhage of the right hemisphere, predominantly anterior. There is a small amount of subdural blood layering along the falx cerebrum. No midline shift or other mass effect. No hydrocephalus. Vascular: Atherosclerotic calcification of the vertebral and internal carotid arteries at the skull base. No abnormal hyperdensity of the major intracranial arteries or dural venous sinuses. Skull: Large left posterior scalp hematoma. Nondisplaced left occipital skull fracture. Sinuses/Orbits: No fluid levels or advanced mucosal thickening of the visualized paranasal sinuses. No mastoid or middle ear effusion. Normal orbits. Other: None. CT CERVICAL SPINE FINDINGS Alignment: Grade 1 anterolisthesis  at C4-5. Skull base and vertebrae: No acute fracture. Soft tissues and spinal canal: No prevertebral fluid or swelling. No visible canal hematoma. Disc levels: No advanced spinal canal or neural foraminal stenosis. Upper chest: No pneumothorax, pulmonary nodule or pleural effusion. Other: Normal visualized paraspinal cervical soft tissues. IMPRESSION: 1. Mixed subdural and subarachnoid hemorrhage of the right hemisphere, predominantly anterior. No midline shift or other mass effect. 2. Nondisplaced left occipital skull fracture with large left posterior scalp hematoma. 3. No acute fracture or static subluxation of the cervical spine. Critical Value/emergent results were called by telephone at the time of interpretation on 10/26/2023 at 11:50 pm to provider JONATHAN GILLIAM , who verbally acknowledged these results. Electronically Signed   By: Juanetta Nordmann M.D.   On: 10/26/2023 23:51   CT  CHEST ABDOMEN PELVIS W CONTRAST Result Date: 10/26/2023 CLINICAL DATA:  Blunt trauma, loss of balance, fell backwards and hit head, hypertension EXAM: CT CHEST, ABDOMEN, AND PELVIS WITH CONTRAST TECHNIQUE: Multidetector CT imaging of the chest, abdomen and pelvis was performed following the standard protocol during bolus administration of intravenous contrast. RADIATION DOSE REDUCTION: This exam was performed according to the departmental dose-optimization program which includes automated exposure control, adjustment of the mA and/or kV according to patient size and/or use of iterative reconstruction technique. CONTRAST:  75mL OMNIPAQUE  IOHEXOL  350 MG/ML SOLN COMPARISON:  06/16/2016, 06/05/2023 FINDINGS: CT CHEST FINDINGS Cardiovascular: The heart is enlarged without pericardial effusion. No evidence of thoracic aortic aneurysm or dissection. No evidence of vascular injury. Atherosclerosis of the aorta and coronary vasculature. Mediastinum/Nodes: No enlarged mediastinal, hilar, or axillary lymph nodes. Thyroid  gland, trachea, and esophagus demonstrate no significant findings. Lungs/Pleura: No acute airspace disease, effusion, or pneumothorax. Central airways are patent. Musculoskeletal: There is an acute appearing fracture through the superior endplate of the T10 vertebral body, with less than 10% loss of height. No retropulsion. No other acute bony abnormalities. Chronic appearing compression deformity within the inferior endplate of the T9 vertebral body. Reconstructed images demonstrate no additional findings. CT ABDOMEN PELVIS FINDINGS Hepatobiliary: Nodular contour of the liver consistent with cirrhosis. No focal parenchymal liver abnormality. No evidence of hepatic injury. The gallbladder is unremarkable. No biliary duct dilation. Pancreas: Unremarkable. No pancreatic ductal dilatation or surrounding inflammatory changes. Spleen: The spleen is enlarged, measuring 11.8 x 11.7 x 6.0 cm. No focal parenchymal  abnormality. Adrenals/Urinary Tract: No adrenal hemorrhage or renal injury identified. Bladder is unremarkable. Stomach/Bowel: No bowel obstruction or ileus. No bowel wall thickening or inflammatory change. Vascular/Lymphatic: Aortic atherosclerosis. No enlarged abdominal or pelvic lymph nodes. Reproductive: Multiple calcified uterine fibroids. No adnexal masses. Other: No free fluid or free intraperitoneal gas. Small fat containing umbilical hernia. Musculoskeletal: Chronic appearing compression deformities are seen at T12, L2, L3, L4, and L5, with no significant change since the prior exam. Reconstructed images demonstrate no additional findings. IMPRESSION: 1. Acute appearing compression deformity through the superior endplate of the T10 vertebral body, with less than 10% loss of vertebral body height. No retropulsion. 2. Otherwise no acute intrathoracic, intra-abdominal, or intrapelvic trauma. 3. 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 4. Other chronic thoracolumbar compression deformities as above. 5.  Aortic Atherosclerosis (ICD10-I70.0). 6. Cardiomegaly without pericardial effusion. Electronically Signed   By: Bobbye Burrow M.D.   On: 10/26/2023 23:38   DG Chest Port 1 View Result Date: 10/26/2023 CLINICAL DATA:  Fall, chest are EXAM: PORTABLE CHEST 1 VIEW COMPARISON:  None Available. FINDINGS: Lungs are well expanded, symmetric, and clear. No pneumothorax or  pleural effusion. Cardiac size within normal limits. Pulmonary vascularity is normal. Osseous structures are age-appropriate. No acute bone abnormality. IMPRESSION: 1. No active disease. Electronically Signed   By: Worthy Heads M.D.   On: 10/26/2023 22:26    Labs:  Basic Metabolic Panel: Recent Labs  Lab 11/08/23 0425 11/09/23 0437 11/10/23 0556 11/11/23 0513 11/12/23 0731 11/13/23 0638  NA 135 136 134* 138 137 135  K 3.8 3.8 4.1 3.1* 3.8 4.1  CL 107 104 106 106 106 102  CO2 19* 21* 20* 21* 19* 24   GLUCOSE 201* 191* 308* 165* 160* 178*  BUN 15 15 15 14 15 20   CREATININE 0.48 0.44 0.51 0.50 0.50 0.52  CALCIUM  7.9* 8.6* 8.3* 8.6* 8.4* 8.5*  MG 2.0 1.9 2.0 2.2  --   --   PHOS 4.0 3.1 3.3 4.0  --   --     CBC: Recent Labs  Lab 11/10/23 0556 11/11/23 0513 11/12/23 0731 11/13/23 0638  WBC 6.6 6.9 7.6 7.5  NEUTROABS 4.0 4.3 4.0  --   HGB 10.6* 11.2* 11.1* 11.2*  HCT 32.8* 35.4* 35.7* 34.7*  MCV 99.4 101.1* 100.6* 97.7  PLT 267 292 370 344    CBG: Recent Labs  Lab 11/13/23 0754 11/13/23 1434 11/13/23 1637 11/13/23 2129 11/14/23 0528  GLUCAP 161* 321* 286* 153* 116*    Brief HPI:   Miranda Cochran is a 73 y.o. female admitted to CIR therapies for TBI. On 6/1 the patient developed SVT and hypotension overnight. Telemetry monitoring was unavailable. Patient discharged back to acute for further evaluation.     Hospital Course: Miranda Cochran was admitted to rehab 11/11/2023 for inpatient therapies to consist of PT, ST and OT at least three hours five days a week. Past admission physiatrist, therapy team and rehab RN have worked together to provide customized collaborative inpatient rehab.   Blood pressures were monitored on TID basis and remained soft.   Diabetes has been monitored with ac/hs CBG checks and SSI was use prn for tighter BS control.    Rehab course: During patient's stay in rehab weekly team conferences were held to monitor patient's progress, set goals and discuss barriers to discharge. At admission, patient required mod assist and sit to stand transfers.  He/She  has had improvement in activity tolerance, balance, postural control as well as ability to compensate for deficits. He/She has had improvement in functional use RUE/LUE  and RLE/LLE as well as improvement in awareness. Eval completed prior to discharge to acute patient was requiring mod assit/ walking with hand held assit 12 feet. ADL's required min assit with eating and grooming.         Disposition: Discharge disposition: 02-Transferred to [facility] ([place])      Diet: Tube feeds as directed   Special Instructions: As per hospitalist.   Medications per discharge per hostpitalist.    Allergies as of 11/13/2023       Reactions   Fentanyl  Anxiety   Made her feel very anxious, requests not to get it.   Amoxicillin  Diarrhea   Augmentin  [amoxicillin -pot Clavulanate] Diarrhea   Statins Other (See Comments)   Myalgia   Farxiga  [dapagliflozin ] Diarrhea, Other (See Comments)   GI pain    Levaquin [levofloxacin] Anxiety   Weird dreams        Medication List     ASK your doctor about these medications    AMBULATORY NON FORMULARY MEDICATION ACTIVE LIVER Artichoke leaf extract, Milk thistle, Turmeric Take 1  capsule by mouth once daily   amiodarone  200 MG tablet Commonly known as: PACERONE  Place 1 tablet (200 mg total) into feeding tube daily.   aspirin  81 MG chewable tablet Place 1 tablet (81 mg total) into feeding tube daily.   Cholecalciferol  25 MCG (1000 UT) Chew Chew 1 tablet (1,000 Units total) by mouth daily.   Cyanocobalamin  500 MCG Chew Chew 500 mcg by mouth daily.   ezetimibe  10 MG tablet Commonly known as: ZETIA  Place 1 tablet (10 mg total) into feeding tube daily. TAKE 1 TABLET(10 MG) BY MOUTH DAILY   feeding supplement (OSMOLITE 1.5 CAL) Liqd Place 1,000 mLs into feeding tube continuous.   fiber supplement (BANATROL TF) liquid Place 60 mLs into feeding tube 2 (two) times daily.   free water  Soln Place 200 mLs into feeding tube every 8 (eight) hours.   insulin  aspart 100 UNIT/ML injection Commonly known as: novoLOG  Inject 3 Units into the skin every 4 (four) hours.   insulin  aspart 100 UNIT/ML injection Commonly known as: novoLOG  Inject 0-15 Units into the skin every 4 (four) hours.   insulin  glargine-yfgn 100 UNIT/ML injection Commonly known as: SEMGLEE  Inject 0.15 mLs (15 Units total) into the skin daily.    levETIRAcetam  1000 MG tablet Commonly known as: KEPPRA  Place 1 tablet (1,000 mg total) into feeding tube 2 (two) times daily.   metoprolol  tartrate 25 MG tablet Commonly known as: LOPRESSOR  Place 1 tablet (25 mg total) into feeding tube every 8 (eight) hours.   MULTIVITAMIN PO Take by mouth.   naloxone 4 MG/0.1ML Liqd nasal spray kit Commonly known as: NARCAN Spray the contents of 1 device into 1 nostril. Call 911. May repeat with 2nd device in alternate nostril if no response in 2-3 minutes.   pantoprazole  40 MG injection Commonly known as: PROTONIX  Inject 40 mg into the vein at bedtime.   predniSONE  10 MG tablet Commonly known as: DELTASONE  Take 10 mg by mouth daily with breakfast.   thiamine  100 MG tablet Commonly known as: Vitamin B-1 Place 1 tablet (100 mg total) into feeding tube daily.   traMADol  50 MG tablet Commonly known as: ULTRAM  1 tablet (50 mg total) by Per NG tube route every 12 (twelve) hours as needed for moderate pain (pain score 4-6).         Signed: Akeel Reffner L Japneet Staggs 11/14/2023, 11:50 AM

## 2023-11-14 NOTE — Progress Notes (Signed)
 Inpatient Rehabilitation Care Coordinator Discharge Note   Patient Details  Name: Miranda Cochran MRN: [id number] Date of Birth: [birth year]   Discharge location: D/c to acute due to medical concerns  Length of Stay: 1 day  Discharge activity level: N/A  Home/community participation: N/A  Patient response WU:[id number] Literacy - How often do you need to have someone help you when you read instructions, pamphlets, or other written material from your doctor or pharmacy?: Patient unable to respond  Patient response YN:WGNFAO Isolation - How often do you feel lonely or isolated from those around you?: Patient unable to respond  Services provided included:    Financial Services:       Choices offered to/list presented to:    Follow-up services arranged:              Patient response to transportation need:         Patient/Family verbalized understanding of follow-up arrangements:     Individual responsible for coordination of the follow-up plan:    Confirmed correct DME delivered: Miranda Cochran 11/14/2023    Comments (or additional information):    Miranda Cochran A Brendolyn Callas

## 2023-11-14 NOTE — Progress Notes (Signed)
 [facility] night cross cover note:   I was notified by the patient's RN that the patient is complaining of additional back discomfort, and that she had received a one-time dose of 2 mg of IV morphine  last evening, with some improvement.   I subsequently placed updated order for morphine  2 mg iv x 1 dose prn for pain.     Camelia Cavalier, DO Hospitalist

## 2023-11-15 ENCOUNTER — Other Ambulatory Visit (HOSPITAL_COMMUNITY): Payer: Self-pay

## 2023-11-15 DIAGNOSIS — I471 Supraventricular tachycardia, unspecified: Secondary | ICD-10-CM | POA: Diagnosis not present

## 2023-11-15 LAB — GLUCOSE, CAPILLARY
Glucose-Capillary: 99 mg/dL (ref 70–99)
Glucose-Capillary: 264 mg/dL — ABNORMAL HIGH (ref 70–99)

## 2023-11-15 MED ORDER — AMIODARONE HCL 200 MG PO TABS: 200.0000 mg | ORAL_TABLET | Freq: Every day | ORAL | 1 refills | Status: DC

## 2023-11-15 MED ORDER — ASPIRIN 81 MG PO CHEW: 81.0000 mg | CHEWABLE_TABLET | Freq: Every day | ORAL | 1 refills | Status: AC

## 2023-11-15 MED ORDER — ASPIRIN 81 MG PO CHEW: 81.0000 mg | CHEWABLE_TABLET | Freq: Every day | ORAL | 1 refills | Status: DC

## 2023-11-15 MED ORDER — LEVETIRACETAM 1000 MG PO TABS: 1000.0000 mg | ORAL_TABLET | Freq: Two times a day (BID) | ORAL | 1 refills | Status: DC

## 2023-11-15 MED ORDER — TRAMADOL HCL 50 MG PO TABS: 50.0000 mg | ORAL_TABLET | Freq: Two times a day (BID) | ORAL | Status: DC | PRN

## 2023-11-15 MED ORDER — METOPROLOL TARTRATE 50 MG PO TABS: 50.0000 mg | ORAL_TABLET | Freq: Two times a day (BID) | ORAL | 1 refills | Status: DC

## 2023-11-15 MED ORDER — LEVETIRACETAM 1000 MG PO TABS
1000.0000 mg | ORAL_TABLET | Freq: Two times a day (BID) | ORAL | 1 refills | Status: DC
  Filled 2023-11-15: qty 120, 60d supply, fill #0

## 2023-11-15 MED ORDER — EZETIMIBE 10 MG PO TABS: 10.0000 mg | ORAL_TABLET | Freq: Every day | ORAL | 1 refills | Status: DC

## 2023-11-15 MED ORDER — EZETIMIBE 10 MG PO TABS: 10.0000 mg | ORAL_TABLET | Freq: Every day | ORAL | 1 refills | Status: AC

## 2023-11-15 NOTE — Progress Notes (Signed)
 Physical Therapy Treatment Patient Details Name: Miranda Cochran MRN: [id number] DOB: 1950/11/18 Today's Date: 11/15/2023   History of Present Illness Pt is a 73 y.o. female presenting after fall 5/14. CT with occipital skull fracture, traumatic subarachnoid hemorrhage and right subdural hematoma. Repeat CT increased size of R subdural hematoma. Intubated 5/15-5/23 secondary to respiratory failure secondary to E. coli, Streptococcus HAP. EEG 5/16 with cortical dysfunction arising from R hemisphere, moderate-severe diffuse encephalopathy. EEG 5/17 with seizure activity averaging 4 seizures per hour lasting 1 min each; resolved with adjustment to medications. RLE DVT 5/19 and IVC filter placed. PMH significant for autoimmune hepatitis with cirrhosis of liver, HTN, depression, GERD, HLD, DM, IBS, osteoporosis, thyroid  nodule, vascular disease, vitamin D deficiency.    PT Comments  Pt seated up EOB on arrival and agreeable to session. Pt with labile mood this session, intermittently crying and internally distracted by desire to go home, encouragement and reassurance provided throughout session. Pt able to stand from EOB with min A to steady on rise and facilitate anterior weight shift and step pivot EOB>chair with HHA. Pt declining further gait trials despite max encouragement from this PTA and pt spouse. Pt performing LE exercises for strength maintenance with good tolerance and verbalizing understanding of completion throughout day. Patient will benefit from intensive inpatient follow-up therapy, >3 hours/day, will continue to follow acutely.    If plan is discharge home, recommend the following: Two people to help with walking and/or transfers;Two people to help with bathing/dressing/bathroom;Assistance with feeding;Direct supervision/assist for medications management;Direct supervision/assist for financial management;Assist for transportation;Help with stairs or ramp for entrance;Supervision due to cognitive  status   Can travel by private vehicle        Equipment Recommendations  Hospital bed;Wheelchair cushion (measurements PT);Wheelchair (measurements PT);Rolling walker (2 wheels)    Recommendations for Other Services       Precautions / Restrictions Precautions Precautions: Fall Recall of Precautions/Restrictions: Impaired Precaution/Restrictions Comments: Prior compression fxs, no formal precautions ordered. Cortrak. Restrictions Weight Bearing Restrictions Per Provider Order: No     Mobility  Bed Mobility Overal bed mobility: Needs Assistance             General bed mobility comments: seated up EOB on arrival    Transfers Overall transfer level: Needs assistance Equipment used: 1 person hand held assist, Rolling walker (2 wheels) Transfers: Sit to/from Stand, Bed to chair/wheelchair/BSC Sit to Stand: Min assist   Step pivot transfers: Min assist       General transfer comment: posterior lean with min assist to rise and steady over her feet; to recliner with HHA and other hand on armrest;    Ambulation/Gait               General Gait Details: pt declining further gait, despite max encouragement   Stairs             Wheelchair Mobility     Tilt Bed    Modified Rankin (Stroke Patients Only) Modified Rankin (Stroke Patients Only) Pre-Morbid Rankin Score: No symptoms Modified Rankin: Moderately severe disability     Balance Overall balance assessment: Needs assistance Sitting-balance support: Feet supported, No upper extremity supported Sitting balance-Leahy Scale: Fair Sitting balance - Comments: Sits EOB and BSC with CGA.   Standing balance support: Bilateral upper extremity supported Standing balance-Leahy Scale: Poor                              Communication  Communication Communication: No apparent difficulties  Cognition Arousal: Alert Behavior During Therapy: Lability   PT - Cognitive impairments: Attention,  Awareness, Initiation, Sequencing, Problem solving, Safety/Judgement                   Rancho Levels of Cognitive Functioning Rancho Los Amigos Scales of Cognitive Functioning: Confused, Appropriate: Moderate Assistance [email or web address] Scales of Cognitive Functioning: Confused, Appropriate: Moderate Assistance [VI] PT - Cognition Comments: very emotional this date Following commands: Impaired Following commands impaired: Follows one step commands inconsistently, Follows one step commands with increased time    Cueing Cueing Techniques: Verbal cues, Gestural cues, Tactile cues  Exercises General Exercises - Lower Extremity Long Arc Quad: AROM, Right, Left, 10 reps, Seated Hip Flexion/Marching: AROM, Right, Left, 10 reps, Seated Toe Raises: AROM, Both, 10 reps, Seated Heel Raises: AROM, Both, 10 reps, Seated    General Comments General comments (skin integrity, edema, etc.): spouse present and supportive throughout      Pertinent Vitals/Pain Pain Assessment Pain Assessment: Faces Faces Pain Scale: Hurts little more Pain Location: back Pain Descriptors / Indicators: Grimacing, Burning Pain Intervention(s): Repositioned, Monitored during session, Limited activity within patient's tolerance, Ice applied    Home Living                          Prior Function            PT Goals (current goals can now be found in the care plan section) Acute Rehab PT Goals Patient Stated Goal: return to independence PT Goal Formulation: With patient/family Time For Goal Achievement: 11/28/23 Progress towards PT goals: Progressing toward goals    Frequency    Min 3X/week      PT Plan      Co-evaluation              AM-PAC PT "6 Clicks" Mobility   Outcome Measure  Help needed turning from your back to your side while in a flat bed without using bedrails?: A Little Help needed moving from lying on your back to sitting on the side of a flat bed without using  bedrails?: A Little Help needed moving to and from a bed to a chair (including a wheelchair)?: A Little Help needed standing up from a chair using your arms (e.g., wheelchair or bedside chair)?: A Little Help needed to walk in hospital room?: A Lot (<20 ft) Help needed climbing 3-5 steps with a railing? : Total 6 Click Score: 15    End of Session Equipment Utilized During Treatment: Gait belt Activity Tolerance: Patient tolerated treatment well Patient left: with call bell/phone within reach;in chair;with chair alarm set;with family/visitor present Nurse Communication: Mobility status PT Visit Diagnosis: Unsteadiness on feet (R26.81);Muscle weakness (generalized) (M62.81);History of falling (Z91.81);Pain;Hemiplegia and hemiparesis;Other symptoms and signs involving the nervous system (R29.898);Other abnormalities of gait and mobility (R26.89) Hemiplegia - Right/Left: Left Hemiplegia - dominant/non-dominant: Non-dominant Hemiplegia - caused by:  (traumatic SDH) Pain - part of body:  (buttocks)     Time: 1610-9604 PT Time Calculation (min) (ACUTE ONLY): 23 min  Charges:    $Therapeutic Activity: 23-37 mins PT General Charges $$ ACUTE PT VISIT: 1 Visit                     Brookelin Felber R. PTA Acute Rehabilitation Services Office: [phone]   Agapito Horseman 11/15/2023, 12:01 PM

## 2023-11-15 NOTE — Discharge Summary (Signed)
 ALIVIAH SPAIN BJY:[id number] DOB: September 16, 1950 DOA: 11/13/2023  PCP: Donley Furth, MD  Admit date: 11/13/2023 Discharge date: 11/15/2023  Time spent: 35 minutes  Recommendations for Outpatient Follow-up:  Pcp f/u 1-2 seeks Cardiology f/u (referred) Neurology f/u 1 month (referred)     Discharge Diagnoses:  Principal Problem:   SVT (supraventricular tachycardia) (HCC)   Discharge Condition: stable  Diet recommendation: heart healthy  Filed Weights   11/13/23 1109  Weight: 53.5 kg    History of present illness:  From admission h and p TAIA BRAMLETT is a 73 y.o. female with medical history significant of "HTN, DM type II, GAVE syndrome causing GI bleed, alcohol abuse, autoimmune hepatitis with cirrhosis of liver, chronically on prednisone , GERD, hiatal hernia, and PAD presented after falling and striking her head.  Patient was admitted on 10/26/2023  for TBI. ER labs showed hypokalemia with prolonged QTc on EKG, WBC 16.5, SBP > 200. CT significant for evidence of occipital skull fracture and traumatic subarachnoid hemorrhage and right subdural hematoma.  On 5/15 patient developed AMS and tachyarrhythmia.  Dr. Bonnita Buttner consulted, CT showed TBI with traumatic increasing right subdural hematoma, SAH and right temporal contusion. MRI showed small acute infarcts in bilateral cerebral hemispheres right more than left.  PCCM consulted Dr. Ardelle Kos intubated for worsening TBI, EEG revealed seizure activity, patient received Keppra /phenobarbital .  Serial head CT's have been stable and show improvement."   Pt discharged to IP rehab on 5/30 and had runs of SVT c/b hypotension overnight on 5/31 (discovered during VS rounds); thus, she was readmitted to medicine for closer monitoring.Pt currently sitting up in bed and able to converse w/o difficulty. She is moving all of he extremities, and asking to eat food and stop her tube feeds (pt able to tol PO per husband at bedside fro 2 meals yesterday). Pt  continues in SR w/ rates of 90s on my exam.    Of note, recent "hospital course significant for RLE DVT, IVC filter was placed by Dr. Nereida Banning she was not a candidate for anticoagulation given falls/TBI. Alk phos trended upwards requiring close monitoring of LFT's. CTA chest negative for PE but showed LLL PNA.  Dr. Stann Earnest consulted for runs of SVT that later terminated with adenosine .  Lopressor  and amiodarone  initiated via tube.  TTE showed normal EF and moderate AS.  Coretrak placed 5/21 due to expressive aphasia and dysphagia SLP following.  She continues to be limited by left-sided weakness, expressive aphasia and dysphagia. She is mod assist and was independent PTA."      Hospital Course:   # AVNRT The reason for readmission. Cardiology consulted. No recurrence. Resolved with up-titration of metoprolol , continuing amio - outpatient cardiology f/u, referral placed  # Traumatic subdural hemorrhage # Traumatic intraparenchymal hemorrhage # Bilateral CVA Recent admit for this with expressive aphasia, dysphagia, and left-sided weakness. Discharged to CIR. Improving. Diet advanced, tolerating dysphagia diet. NG tube discontinued per patient request - patient/husband decline CIR, elect discharge home, say they have all necessary DME, request only home health PT/OT which I have ordered - neuro f/u 1 month   # Seizures 2/2 above - continue keppra , f/u neuro 1 mo (referral placed)   # HCAP Recently treated, resolved   # Cirrhosis # Autoimmune hepatitis Stable - cont home prednisone  - outpatient GI f/u   # RLE DVT During recent hospitalization. IVC filter placed, not anticoagulated 2/2 brain bleed. CTA was negative for PE - monitor   # History mod to severe  aortic stenosis Noted   # Hx alcohol abuse Recent consumption less and now s/p extended hospitalization, did not withdraw   # T2DM Euglycemic  Procedures: none   Consultations: cardiology  Discharge Exam: Vitals:    11/15/23 0714 11/15/23 1055  BP: 128/65   Pulse: 83   Resp: 18   Temp: (!) 97.5 F (36.4 C) 98 F (36.7 C)  SpO2:  95%    General exam: Appears calm and comfortable  Respiratory system: Clear to auscultation. Respiratory effort normal. Cardiovascular system: S1 & S2 heard, RRR. Systolic murmur Gastrointestinal system: Abdomen is nondistended, soft and nontender.   Central nervous system: Alert and oriented. Expressive aphasia. Mild left sided weakness Extremities: Symmetric 5 x 5 power. Skin: No rashes, lesions or ulcers Psychiatry: Judgement and insight appear normal. Mood & affect appropriate.   Discharge Instructions   Discharge Instructions     Ambulatory referral to Cardiology   Complete by: As directed    Ambulatory referral to Neurology   Complete by: As directed    An appointment is requested in approximately: 4 weeks   Diet - low sodium heart healthy   Complete by: As directed    Increase activity slowly   Complete by: As directed    No wound care   Complete by: As directed       Allergies as of 11/15/2023       Reactions   Fentanyl  Anxiety   Made her feel very anxious, requests not to get it.   Amoxicillin  Diarrhea   Augmentin  [amoxicillin -pot Clavulanate] Diarrhea   Statins Other (See Comments)   Myalgia   Farxiga  [dapagliflozin ] Diarrhea, Other (See Comments)   GI pain    Levaquin [levofloxacin] Anxiety   Weird dreams        Medication List     STOP taking these medications    AMBULATORY NON FORMULARY MEDICATION   feeding supplement (OSMOLITE 1.5 CAL) Liqd   fiber supplement (BANATROL TF) liquid   free water  Soln   pantoprazole  40 MG injection Commonly known as: PROTONIX    thiamine  100 MG tablet Commonly known as: Vitamin B-1       TAKE these medications    amiodarone  200 MG tablet Commonly known as: PACERONE  Place 1 tablet (200 mg total) into feeding tube daily.   aspirin  81 MG chewable tablet Place 1 tablet (81 mg total)  into feeding tube daily.   Cholecalciferol  25 MCG (1000 UT) Chew Chew 1 tablet (1,000 Units total) by mouth daily.   Cyanocobalamin  500 MCG Chew Chew 500 mcg by mouth daily.   ezetimibe  10 MG tablet Commonly known as: ZETIA  Place 1 tablet (10 mg total) into feeding tube daily. TAKE 1 TABLET(10 MG) BY MOUTH DAILY   insulin  aspart 100 UNIT/ML injection Commonly known as: novoLOG  Inject 3 Units into the skin every 4 (four) hours.   insulin  aspart 100 UNIT/ML injection Commonly known as: novoLOG  Inject 0-15 Units into the skin every 4 (four) hours.   insulin  glargine-yfgn 100 UNIT/ML injection Commonly known as: SEMGLEE  Inject 0.15 mLs (15 Units total) into the skin daily.   levETIRAcetam  1000 MG tablet Commonly known as: KEPPRA  Take 1 tablet (1,000 mg total) by mouth 2 (two) times daily. What changed: how to take this   metoprolol  tartrate 50 MG tablet Commonly known as: LOPRESSOR  Take 1 tablet (50 mg total) by mouth 2 (two) times daily. What changed:  medication strength how much to take how to take this when to take this  MULTIVITAMIN PO Take by mouth.   naloxone 4 MG/0.1ML Liqd nasal spray kit Commonly known as: NARCAN Spray the contents of 1 device into 1 nostril. Call 911. May repeat with 2nd device in alternate nostril if no response in 2-3 minutes.   predniSONE  10 MG tablet Commonly known as: DELTASONE  Take 10 mg by mouth daily with breakfast.   traMADol  50 MG tablet Commonly known as: ULTRAM  Take 1 tablet (50 mg total) by mouth every 12 (twelve) hours as needed for moderate pain (pain score 4-6). What changed: how to take this       Allergies  Allergen Reactions   Fentanyl  Anxiety    Made her feel very anxious, requests not to get it.   Amoxicillin  Diarrhea   Augmentin  [Amoxicillin -Pot Clavulanate] Diarrhea   Statins Other (See Comments)    Myalgia   Farxiga  [Dapagliflozin ] Diarrhea and Other (See Comments)    GI pain    Levaquin [Levofloxacin]  Anxiety    Weird dreams    Follow-up Information     Donley Furth, MD Follow up.   Specialty: Family Medicine Contact information: [address] [place] [address] [phone]         Katrine Parody, MD Follow up.   Specialty: Cardiology Contact information: [address] [place] [address] [phone]         [facility] Follow up.   Why: 1 month Contact information: [address] [place], [address] [place]  [address] [phone]                 The results of significant diagnostics from this hospitalization (including imaging, microbiology, ancillary and laboratory) are listed below for reference.    Significant Diagnostic Studies: DG Swallowing Func-Speech Pathology Result Date: 11/10/2023 Table formatting from the original result was not included. Modified Barium Swallow Study Patient Details Name: MAYRELI ALDEN MRN: [id number] Date of Birth: Dec 11, 1950 Today's Date: 11/10/2023 HPI/PMH: HPI: Pt is a 73 y.o. female presenting after fall 5/14. CT with occipital skull fracture, traumatic subarachnoid hemorrhage and right subdural hematoma. Repeat CT increased size of R subdural hematoma. Intubated 5/15-5/23 secondary to respiratory failure secondary to E. coli, Streptococcus HAP. EEG 5/16 with cortical dysfunction arising from R hemisphere, moderate-severe diffuse encephalopathy. EEG 5/17 with seizure activity averaging 4 seizures per hour lasting 1 min each; resolved with adjustment to medications. RLE DVT 5/19 and IVC filter placed. PMH significant for autoimmune hepatitis with cirrhosis of liver, HTN, depression, GERD, HLD, DM, IBS, osteoporosis, thyroid  nodule, vascular disease, vitamin D deficiency. Clinical Impression: Clinical Impression: Pt demonstrated a DIGEST score of 2 indicating moderate oropharyngeal dysphagia. Pt had excessive movements, decreased sustained attention and needed cueing throughout study.  There was decreased labial closure with anterior spill, trace lingual residue and slow mastication of solid in part due to decreased attention. There was incomplete glottal closure, partial anterior hyoid excursion and partial epiglottic inversion resulting in penetration with thin to the cords that pt sensed and cleared throat to clear penetrates. Chin tuck strategy was ineffective. Nectar thick penetrated to cords with minimal remaining in vestibule. Laryngeal penetration with honey thick to vocal cords that pt sensed and spontaneous throat clear cleared vestibule. Pharyngeal residue was mild in valleculae and pyriform sinuses. Recommend puree texture, honey thick liquids, intermittent throat clear, crush pills and full assist/supervision with po's. DIGEST Swallow Severity Rating*  Safety:  Efficiency:  Overall Pharyngeal Swallow Severity: 1: mild; 2: moderate; 3: severe; 4: profound *The  Dynamic Imaging Grade of Swallowing Toxicity is standardized for the head and neck cancer population, however, demonstrates promising clinical applications across populations to standardize the clinical rating of pharyngeal swallow safety and severity. Factors that may increase risk of adverse event in presence of aspiration Roderick Civatte & Jessy Morocco 2021): Factors that may increase risk of adverse event in presence of aspiration Roderick Civatte & Jessy Morocco 2021): Reduced cognitive function Recommendations/Plan: Swallowing Evaluation Recommendations Swallowing Evaluation Recommendations Recommendations: PO diet PO Diet Recommendation: Moderately thick liquids (Level 3, honey thick); Dysphagia 1 (Pureed) Liquid Administration via: Cup Medication Administration: Crushed with puree Supervision: Staff to assist with self-feeding; Full supervision/cueing for swallowing strategies Swallowing strategies  : Slow rate; Small bites/sips; Clear throat intermittently Postural changes: Position pt fully upright for meals Oral care recommendations: Oral care  BID (2x/day) Treatment Plan Treatment Plan Treatment recommendations: Therapy as outlined in treatment plan below Follow-up recommendations: Acute inpatient rehab (3 hours/day) Functional status assessment: Patient has had a recent decline in their functional status and demonstrates the ability to make significant improvements in function in a reasonable and predictable amount of time. Treatment frequency: Min 2x/week Treatment duration: 2 weeks Interventions: Compensatory techniques; Patient/family education; Trials of upgraded texture/liquids; Diet toleration management by SLP Recommendations Recommendations for follow up therapy are one component of a multi-disciplinary discharge planning process, led by the attending physician.  Recommendations may be updated based on patient status, additional functional criteria and insurance authorization. Assessment: Orofacial Exam: Orofacial Exam Oral Cavity - Dentition: Adequate natural dentition Orofacial Anatomy: WFL Oral Motor/Sensory Function: WFL Anatomy: Anatomy: WFL Boluses Administered: Boluses Administered Boluses Administered: Thin liquids (Level 0); Mildly thick liquids (Level 2, nectar thick); Moderately thick liquids (Level 3, honey thick); Puree; Solid  Oral Impairment Domain: Oral Impairment Domain Lip Closure: Escape progressing to mid-chin Tongue control during bolus hold: Cohesive bolus between tongue to palatal seal Bolus preparation/mastication: Slow prolonged chewing/mashing with complete recollection Bolus transport/lingual motion: Brisk tongue motion Oral residue: Trace residue lining oral structures Location of oral residue : Tongue Initiation of pharyngeal swallow : Pyriform sinuses  Pharyngeal Impairment Domain: Pharyngeal Impairment Domain Soft palate elevation: No bolus between soft palate (SP)/pharyngeal wall (PW) Laryngeal elevation: Complete superior movement of thyroid  cartilage with complete approximation of arytenoids to epiglottic petiole  Anterior hyoid excursion: Partial anterior movement Epiglottic movement: Partial inversion Laryngeal vestibule closure: Incomplete, narrow column air/contrast in laryngeal vestibule Pharyngeal stripping wave : Present - complete Pharyngeal contraction (A/P view only): N/A Pharyngoesophageal segment opening: Complete distension and complete duration, no obstruction of flow Tongue base retraction: No contrast between tongue base and posterior pharyngeal wall (PPW) Pharyngeal residue: Collection of residue within or on pharyngeal structures Location of pharyngeal residue: Valleculae; Pyriform sinuses  Esophageal Impairment Domain: Esophageal Impairment Domain Esophageal clearance upright position: Esophageal retention Pill: No data recorded Penetration/Aspiration Scale Score: Penetration/Aspiration Scale Score 1.  Material does not enter airway: Solid; Puree 2.  Material enters airway, remains ABOVE vocal cords then ejected out: Moderately thick liquids (Level 3, honey thick) 3.  Material enters airway, remains ABOVE vocal cords and not ejected out: Moderately thick liquids (Level 3, honey thick) 4.  Material enters airway, CONTACTS cords then ejected out: Moderately thick liquids (Level 3, honey thick) Compensatory Strategies: Compensatory Strategies Compensatory strategies: Yes Chin tuck: Ineffective Ineffective Chin Tuck: Thin liquid (Level 0)   General Information: Caregiver present: No  Diet Prior to this Study: NPO; Cortrak/Small bore NG tube   Temperature : Normal   Respiratory Status: WFL   Supplemental O2:  None (Room air)   History of Recent Intubation: Yes  Behavior/Cognition: Alert; Cooperative; Confused; Distractible; Requires cueing Self-Feeding Abilities: Needs assist with self-feeding Baseline vocal quality/speech: Normal No data recorded No data recorded Exam Limitations: Excessive movement Goal Planning: Prognosis for improved oropharyngeal function: Good Barriers to Reach Goals: Cognitive deficits No  data recorded No data recorded Consulted and agree with results and recommendations: Patient; Nurse Pain: Pain Assessment Pain Assessment: Faces Faces Pain Scale: 0 Breathing: 0 Negative Vocalization: 0 Facial Expression: 0 Body Language: 0 Consolability: 0 PAINAD Score: 0 Facial Expression: 0 Body Movements: 0 Muscle Tension: 0 Compliance with ventilator (intubated pts.): N/A Vocalization (extubated pts.): 0 CPOT Total: 0 Pain Location: generalized, back, sacral sores Pain Descriptors / Indicators: Discomfort; Grimacing Pain Intervention(s): Monitored during session End of Session: Start Time:SLP Start Time (ACUTE ONLY): 1418 Stop Time: SLP Stop Time (ACUTE ONLY): 1440 Time Calculation:SLP Time Calculation (min) (ACUTE ONLY): 22 min Charges: SLP Evaluations $ SLP Speech Visit: 1 Visit SLP Evaluations $MBS Swallow: 1 Procedure $Swallowing Treatment: 1 Procedure SLP visit diagnosis: SLP Visit Diagnosis: Dysphagia, oropharyngeal phase (R13.12) Past Medical History: Past Medical History: Diagnosis Date  Abnormal Pap smear of cervix   05-15-21 ascus hpv hr+  Allergy   Anxiety   on meds  Autoimmune hepatitis (HCC) 08/18/2016  08/2016 liver bx confirms, fibrosis not cirrhosis  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 08/29/2020  COVID 06/27/2020  Depression   on meds  DM (diabetes mellitus) (HCC)   Fracture, fibula   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  GERD (gastroesophageal reflux disease)   on meds  Hiatal hernia   Hx of adenomatous polyp of colon 07/07/2009  Hyperlipidemia   diet controlled  Hypertension   on meds - LASIX   IBS (irritable bowel syndrome)   Iron  deficiency anemia   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 Willy Harvest, adenimatous polyps, repeat in 5 yrs  ESOPHAGOGASTRODUODENOSCOPY  07/06/2021  per Dr. Willy Harvest, showed portal hypertensive gastropathy, repeat in 2 yrs  ESOPHAGOGASTRODUODENOSCOPY (EGD) WITH PROPOFOL  N/A 06/20/2017  Procedure: ESOPHAGOGASTRODUODENOSCOPY (EGD) WITH PROPOFOL ;  Surgeon: Kenney Peacemaker, MD;  Location: [facility];  Service: Endoscopy;  Laterality: N/A;  with Barrx  EYE SURGERY    IR IVC FILTER PLMT / S&I /IMG GUID/MOD SED  10/31/2023  NASAL SEPTOPLASTY W/ TURBINOPLASTY    POLYPECTOMY  2015  CG-TA  TONSILLECTOMY   [name] 11/10/2023, 3:49 PM  ECHOCARDIOGRAM COMPLETE Result Date: 11/07/2023    ECHOCARDIOGRAM REPORT   Patient Name:   LUBA MATZEN Date of Exam: 11/07/2023 Medical Rec #:  [id number]     Height:       64.0 in Accession #:    [id number]    Weight:       124.6 lb Date of Birth:  03-15-1951    BSA:          1.600 m Patient Age:    72 years      BP:           117/71 mmHg Patient Gender: F  HR:           89 bpm. Exam Location:  Inpatient Procedure: 2D Echo, Cardiac Doppler and Color Doppler (Both Spectral and Color            Flow Doppler were utilized during procedure). Indications:    Abnormal ECG R94.31  History:        Patient has prior history of Echocardiogram examinations, most                 recent 10/27/2023.  Sonographer:    Terrilee Few RCS Referring Phys: Florie Husband Andrez Banker [facility]  Sonographer Comments: Image acquisition challenging due to uncooperative patient. IMPRESSIONS  1. Left ventricular ejection fraction, by estimation, is 60 to 65%. The left ventricle has normal function. The left ventricle has no regional wall motion abnormalities. There is moderate concentric left ventricular hypertrophy. Left ventricular diastolic parameters are consistent with Grade I diastolic dysfunction (impaired relaxation).  2. Right ventricular  systolic function is normal. The right ventricular size is normal. There is normal pulmonary artery systolic pressure. The estimated right ventricular systolic pressure is 25.3 mmHg.  3. The mitral valve is normal in structure. Trivial mitral valve regurgitation. No evidence of mitral stenosis.  4. The aortic valve is tricuspid. There is severe calcifcation of the aortic valve. Aortic valve regurgitation is not visualized. Moderate to severe aortic valve stenosis. Aortic valve mean gradient measures 26.0 mmHg, AVA 0.96 cm^2.  5. The inferior vena cava is normal in size with greater than 50% respiratory variability, suggesting right atrial pressure of 3 mmHg. FINDINGS  Left Ventricle: Left ventricular ejection fraction, by estimation, is 60 to 65%. The left ventricle has normal function. The left ventricle has no regional wall motion abnormalities. The left ventricular internal cavity size was normal in size. There is  moderate concentric left ventricular hypertrophy. Left ventricular diastolic parameters are consistent with Grade I diastolic dysfunction (impaired relaxation). Right Ventricle: The right ventricular size is normal. No increase in right ventricular wall thickness. Right ventricular systolic function is normal. There is normal pulmonary artery systolic pressure. The tricuspid regurgitant velocity is 2.36 m/s, and  with an assumed right atrial pressure of 3 mmHg, the estimated right ventricular systolic pressure is 25.3 mmHg. Left Atrium: Left atrial size was normal in size. Right Atrium: Right atrial size was normal in size. Pericardium: There is no evidence of pericardial effusion. Mitral Valve: The mitral valve is normal in structure. Trivial mitral valve regurgitation. No evidence of mitral valve stenosis. Tricuspid Valve: The tricuspid valve is normal in structure. Tricuspid valve regurgitation is trivial. Aortic Valve: The aortic valve is tricuspid. There is severe calcifcation of the aortic valve.  Aortic valve regurgitation is not visualized. Moderate to severe aortic stenosis is present. Aortic valve mean gradient measures 26.0 mmHg. Aortic valve peak gradient measures 39.4 mmHg. Aortic valve area, by VTI measures 1.08 cm. Pulmonic Valve: The pulmonic valve was normal in structure. Pulmonic valve regurgitation is not visualized. Aorta: The aortic root is normal in size and structure. Venous: The inferior vena cava is normal in size with greater than 50% respiratory variability, suggesting right atrial pressure of 3 mmHg. IAS/Shunts: No atrial level shunt detected by color flow Doppler.  LEFT VENTRICLE PLAX 2D LVIDd:         2.60 cm     Diastology LV PW:         1.20 cm     LV e' medial:    6.53 cm/s LV IVS:  1.40 cm     LV E/e' medial:  11.7 LVOT diam:     2.10 cm     LV e' lateral:   6.53 cm/s LV SV:         55          LV E/e' lateral: 11.7 LV SV Index:   34 LVOT Area:     3.46 cm  LV Volumes (MOD) LV vol d, MOD A2C: 47.6 ml LV vol d, MOD A4C: 46.9 ml LV vol s, MOD A2C: 20.5 ml LV vol s, MOD A4C: 18.6 ml LV SV MOD A2C:     27.1 ml LV SV MOD A4C:     46.9 ml LV SV MOD BP:      30.3 ml RIGHT VENTRICLE             IVC RV S prime:     19.30 cm/s  IVC diam: 1.60 cm TAPSE (M-mode): 2.0 cm LEFT ATRIUM             Index        RIGHT ATRIUM           Index LA diam:        2.80 cm 1.75 cm/m   RA Area:     11.00 cm LA Vol (A2C):   26.3 ml 16.44 ml/m  RA Volume:   23.30 ml  14.56 ml/m LA Vol (A4C):   30.8 ml 19.25 ml/m LA Biplane Vol: 31.0 ml 19.38 ml/m  AORTIC VALVE AV Area (Vmax):    1.14 cm AV Area (Vmean):   1.09 cm AV Area (VTI):     1.08 cm AV Vmax:           314.00 cm/s AV Vmean:          217.333 cm/s AV VTI:            0.507 m AV Peak Grad:      39.4 mmHg AV Mean Grad:      26.0 mmHg LVOT Vmax:         103.00 cm/s LVOT Vmean:        68.300 cm/s LVOT VTI:          0.158 m LVOT/AV VTI ratio: 0.31  AORTA Ao Root diam: 3.40 cm Ao Asc diam:  3.60 cm MITRAL VALVE                TRICUSPID VALVE MV  Area (PHT): 5.31 cm     TR Peak grad:   22.3 mmHg MV Decel Time: 143 msec     TR Vmax:        236.00 cm/s MV E velocity: 76.20 cm/s MV A velocity: 109.00 cm/s  SHUNTS MV E/A ratio:  0.70         Systemic VTI:  0.16 m                             Systemic Diam: 2.10 cm Dalton McleanMD Electronically signed by Archer Bear Signature Date/Time: 11/07/2023/1:37:34 PM    Final    DG Abd Portable 1V Result Date: 11/07/2023 CLINICAL DATA:  Abdominal pain. EXAM: PORTABLE ABDOMEN - 1 VIEW COMPARISON:  Radiograph 10/28/2023, CT 10/26/2023 FINDINGS: Tip of the weighted enteric tube in the region of the distal stomach. No bowel dilatation or evidence of obstruction. No significant formed stool in the colon. Multiple vascular calcifications. IVC filter in place. 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 IMPRESSION:  1. No radiographic explanation for pain. Nonobstructive bowel gas pattern. 2. Tip of the weighted enteric tube in the region of the distal stomach. Electronically Signed   By: Chadwick Colonel M.D.   On: 11/07/2023 12:08   DG Chest Port 1 View Result Date: 11/07/2023 CLINICAL DATA:  Shortness of breath. EXAM: PORTABLE CHEST 1 VIEW COMPARISON:  10/31/2023 FINDINGS: Interval extubation. Left IJ central line has been removed in the interval. A feeding tube passes into the stomach although the distal tip position is not included on the film. The cardio pericardial silhouette is enlarged. The lungs are clear without focal pneumonia, edema, pneumothorax or pleural effusion. Bones are diffusely demineralized. Telemetry leads overlie the chest. IMPRESSION: 1. Interval extubation and removal of left IJ central line. 2. No acute cardiopulmonary findings. Electronically Signed   By: Donnal Fusi M.D.   On: 11/07/2023 07:17   CT ANGIO HEAD NECK W WO CM Result Date: 11/05/2023 CLINICAL DATA:  Stroke workup EXAM: CT ANGIOGRAPHY HEAD AND NECK WITH AND WITHOUT CONTRAST TECHNIQUE: Multidetector CT imaging of the head and neck was  performed using the standard protocol during bolus administration of intravenous contrast. Multiplanar CT image reconstructions and MIPs were obtained to evaluate the vascular anatomy. Carotid stenosis measurements (when applicable) are obtained utilizing NASCET criteria, using the distal internal carotid diameter as the denominator. RADIATION DOSE REDUCTION: This exam was performed according to the departmental dose-optimization program which includes automated exposure control, adjustment of the mA and/or kV according to patient size and/or use of iterative reconstruction technique. CONTRAST:  75mL OMNIPAQUE  IOHEXOL  350 MG/ML SOLN COMPARISON:  Brain MRI from yesterday and head CT from 3 days ago FINDINGS: CT HEAD FINDINGS Brain: Hemorrhagic contusions in subdural collection show no interval increase. No evidence of progressive white matter infarction best seen by recent MRI. No hydrocephalus or shift Vascular: No hyperdense vessel or unexpected calcification. Skull: Left posterior scalp swelling with linear fracture in the posterior left calvarium that is nondepressed. Sinuses/Orbits: Mild mastoid and sinus opacification in the setting of nasal intubation Review of the MIP images confirms the above findings CTA NECK FINDINGS Aortic arch: Atheromatous plaque with 3 vessel branching Right carotid system: Moderate mixed density plaque centered at the bifurcation without ulceration or significant stenosis. Left carotid system: Moderate mixed density plaque at the proximal left ICA without ulceration or flow reducing stenosis. Vertebral arteries: No proximal subclavian stenosis. Atheromatous plaque at both vertebral origins with right V1 stenosis approaching 50%. No dissection, beading, or ulceration. Skeleton: Generalized cervical spine degeneration.  No acute finding Other neck: No acute finding Upper chest: Clear apical lungs Review of the MIP images confirms the above findings CTA HEAD FINDINGS Anterior  circulation: Mild atheromatous calcification on the cavernous carotids. Generalized medium vessel undulation attributed to atherosclerosis, no correctable in flow reducing proximal stenosis. No branch occlusion, beading, or aneurysm Posterior circulation: Left dominant vertebral artery. Vertebral and basilar arteries are sufficiently patent with moderate calcified plaque at the left V4 segment. Fetal type right PCA flow. No branch occlusion, beading, or aneurysm. Venous sinuses: Unremarkable for arterial timing Anatomic variants: As above Review of the MIP images confirms the above findings IMPRESSION: No emergent finding. Cervical and intracranial atherosclerosis with nearly 50% stenosis at the right vertebral origin. Electronically Signed   By: Ronnette Coke M.D.   On: 11/05/2023 05:21   MR BRAIN WO CONTRAST Result Date: 11/04/2023 CLINICAL DATA:  Head trauma, moderate to severe. EXAM: MRI HEAD WITHOUT CONTRAST TECHNIQUE: Multiplanar, multiecho pulse sequences of  the brain and surrounding structures were obtained without intravenous contrast. COMPARISON:  Head CT from 2 days ago FINDINGS: Brain: Small areas of restricted diffusion with nodular and bandlike appearance clustered in the deep right cerebral white matter, centered at the corona radiata, and in the left posterior frontal white matter. Some diffusion restriction along the surface of the posterior right temporal lobe which is likely contusion with susceptibility from subarachnoid blood products when compared with initial imaging. Seizure phenomenon is also considered, there has been EEG monitoring based on prior head CT. Known subdural hematoma along the right more than left cerebral convexity, greatest at the anterior right frontal lobe measuring up to 6 mm thickness. Hemorrhagic contusion in the right anterior inferior frontotemporal region and low left cerebellum. Hygroma along the left frontal convexity up to 5 mm in thickness. 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 No hydrocephalus. Vascular: Major flow voids are preserved. Skull and upper cervical spine: Normal marrow signal. Sinuses/Orbits: Partial right mastoid opacification IMPRESSION: Small acute infarcts in the right more than left cerebral white matter. 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 Electronically Signed   By: Ronnette Coke M.D.   On: 11/04/2023 10:54   CT HEAD WO CONTRAST ( ) Result Date: 11/02/2023 CLINICAL DATA:  Subdural hematoma EXAM: CT HEAD WITHOUT CONTRAST TECHNIQUE: Contiguous axial images were obtained from the base of the skull through the vertex without intravenous contrast. RADIATION DOSE REDUCTION: This exam was performed according to the departmental dose-optimization program which includes automated exposure control, adjustment of the mA and/or kV according to patient size and/or use of iterative reconstruction technique. COMPARISON:  Four days prior FINDINGS: Brain: Patchy subarachnoid hemorrhage along the cerebral convexities is non progressed. Hemorrhagic contusions in the anterior temporal and inferior frontal lobes on the right and in the left cerebellum. The inferior right frontal hematoma measures up to 2.9 cm. The left cerebellar hematoma measures 2.3 cm. Expected regional edema. Mixed density subdural hematoma along the left frontal convexity measures up to 5 mm in thickness without worrisome mass effect. No hydrocephalus, or shift. Perforator infarct in the right corona radiata Vascular: No hyperdense vessel or unexpected calcification. Skull: Nondepressed posterior left calvarium fracture with overlying soft tissue gas. Partial right mastoid opacification in the setting of intubation. No superimposed fracture seen. Sinuses/Orbits: Negative IMPRESSION: No progression of multifocal ICH as described. No mass effect or hydrocephalus. Perforator infarct in the right corona radiata that has occurred since admission.  Electronically Signed   By: Ronnette Coke M.D.   On: 11/02/2023 04:05   IR IVC FILTER PLMT / S&I Dan Dun GUID/MOD SED Result Date: 10/31/2023 CLINICAL DATA:  Intracranial hemorrhage and acute right peroneal vein DVT. EXAM: 1. ULTRASOUND GUIDANCE FOR VASCULAR ACCESS OF THE RIGHT INTERNAL JUGULAR VEIN. 2. IVC VENOGRAM. 3. PERCUTANEOUS IVC FILTER PLACEMENT. ANESTHESIA/SEDATION: None CONTRAST:  30mL OMNIPAQUE  IOHEXOL  300 MG/ML  SOLN FLUOROSCOPY: Radiation Exposure Index: 17 mGy Kerma PROCEDURE: The procedure, risks, benefits, and alternatives were explained to the patient's husband. Questions regarding the procedure were encouraged and answered. The patient's husband understands and consents to the procedure. A time-out was performed prior to initiating the procedure. The right neck was prepped with chlorhexidine  in a sterile fashion, and a sterile drape was applied covering the operative field. A sterile gown and sterile gloves were used for the procedure. Local anesthesia was provided with 1% Lidocaine . Ultrasound was utilized to confirm patency of the right internal jugular vein. An ultrasound image was saved and  recorded. Under direct ultrasound guidance, a 21 gauge needle was advanced into the right internal jugular vein with ultrasound image documentation performed. After securing access with a micropuncture dilator, a guidewire was advanced into the inferior vena cava. A deployment sheath was advanced over the guidewire. This was utilized to perform IVC venography. The deployment sheath was further positioned in an appropriate location for filter deployment. A Bard Denali IVC filter was then advanced in the sheath. This was then fully deployed in the infrarenal IVC. Final filter position was confirmed with a fluoroscopic spot image. After the procedure the sheath was removed and hemostasis obtained with manual compression. COMPLICATIONS: None. FINDINGS: IVC venography demonstrates a normal caliber IVC with no  evidence of thrombus. Renal veins are identified bilaterally. The IVC filter was successfully positioned below the level of the renal veins and is appropriately oriented. This IVC filter has both permanent and retrievable indications. IMPRESSION: Placement of percutaneous IVC filter in infrarenal IVC. IVC venogram shows no evidence of IVC thrombus and normal caliber of the inferior vena cava. This filter does have both permanent and retrievable indications. PLAN: This IVC filter is potentially retrievable. The patient will be assessed for filter retrieval by Interventional Radiology in approximately 8-12 weeks. Further recommendations regarding filter retrieval, continued surveillance or declaration of device permanence, will be made at that time. Electronically Signed   By: Erica Hau M.D.   On: 10/31/2023 17:35   VAS US  LOWER EXTREMITY VENOUS (DVT) Result Date: 10/31/2023  Lower Venous DVT Study Patient Name:  KADE RICKELS  Date of Exam:   10/31/2023 Medical Rec #: [id number]      Accession #:    [id number] Date of Birth: Jan 09, 1951     Patient Gender: F Patient Age:   25 years Exam Location:  [facility] Procedure:      VAS US  LOWER EXTREMITY VENOUS (DVT) Referring Phys: Phyllis Breeze --------------------------------------------------------------------------------  Indications: Edema. Other Indications: Status post fall with resulting subdural and subarachnoid                    hemorrhage and also occipital skull fracture with large scalp                    hematoma. Comparison Study: No prior LEV on file Performing Technologist: Carleene Chase RVS  Examination Guidelines: A complete evaluation includes B-mode imaging, spectral Doppler, color Doppler, and power Doppler as needed of all accessible portions of each vessel. Bilateral testing is considered an integral part of a complete examination. Limited examinations for reoccurring indications may be performed as noted. The reflux portion of the  exam is performed with the patient in reverse Trendelenburg.  +---------+---------------+---------+-----------+----------+--------------+ RIGHT    CompressibilityPhasicitySpontaneityPropertiesThrombus Aging +---------+---------------+---------+-----------+----------+--------------+ CFV      Full           Yes      Yes                                 +---------+---------------+---------+-----------+----------+--------------+ SFJ      Full                                                        +---------+---------------+---------+-----------+----------+--------------+ FV Prox  Full                                                        +---------+---------------+---------+-----------+----------+--------------+  FV Mid   Full                                                        +---------+---------------+---------+-----------+----------+--------------+ FV DistalFull                                                        +---------+---------------+---------+-----------+----------+--------------+ PFV      Full                                                        +---------+---------------+---------+-----------+----------+--------------+ POP      Full           Yes      Yes                                 +---------+---------------+---------+-----------+----------+--------------+ PTV      Full                                                        +---------+---------------+---------+-----------+----------+--------------+ PERO     None                                         Acute          +---------+---------------+---------+-----------+----------+--------------+   +---------+---------------+---------+-----------+----------+-------------------+ LEFT     CompressibilityPhasicitySpontaneityPropertiesThrombus Aging      +---------+---------------+---------+-----------+----------+-------------------+ CFV      Full           Yes      Yes                                       +---------+---------------+---------+-----------+----------+-------------------+ SFJ      Full                                                             +---------+---------------+---------+-----------+----------+-------------------+ FV Prox  Full                                                             +---------+---------------+---------+-----------+----------+-------------------+ FV Mid   Full           Yes      Yes                                      +---------+---------------+---------+-----------+----------+-------------------+  FV DistalFull                                                             +---------+---------------+---------+-----------+----------+-------------------+ PFV      Full                                                             +---------+---------------+---------+-----------+----------+-------------------+ POP      Full           Yes      Yes                                      +---------+---------------+---------+-----------+----------+-------------------+ PTV      Full                                                             +---------+---------------+---------+-----------+----------+-------------------+ PERO                                                  Not well visualized +---------+---------------+---------+-----------+----------+-------------------+     Summary: RIGHT: - Findings consistent with acute deep vein thrombosis involving the right peroneal veins.   LEFT: - No evidence of deep vein thrombosis in the lower extremity. No indirect evidence of obstruction proximal to the inguinal ligament.   *See table(s) above for measurements and observations. Electronically signed by Runell Countryman on 10/31/2023 at 3:44:13 PM.    Final    CT Angio Chest Pulmonary Embolism (PE) W or WO Contrast Result Date: 10/31/2023 CLINICAL DATA:  Pulmonary embolism. EXAM: CT ANGIOGRAPHY CHEST WITH  CONTRAST TECHNIQUE: Multidetector CT imaging of the chest was performed using the standard protocol during bolus administration of intravenous contrast. Multiplanar CT image reconstructions and MIPs were obtained to evaluate the vascular anatomy. RADIATION DOSE REDUCTION: This exam was performed according to the departmental dose-optimization program which includes automated exposure control, adjustment of the mA and/or kV according to patient size and/or use of iterative reconstruction technique. CONTRAST:  67mL OMNIPAQUE  IOHEXOL  350 MG/ML SOLN COMPARISON:  CT dated 10/26/2023. FINDINGS: Cardiovascular: There is no cardiomegaly or pericardial effusion. There is coronary vascular calcification of the LAD. There is calcification of the aortic valve. Mild atherosclerotic calcification of the thoracic aorta. No aneurysmal dilatation or dissection. Evaluation of the pulmonary arteries is limited due to respiratory motion. No central pulmonary artery embolus identified. Mediastinum/Nodes: No hilar or mediastinal adenopathy. An enteric tube noted in the esophagus extending into the stomach. No mediastinal fluid collection. Lungs/Pleura: Large area of consolidation involving the left lower lobe with air bronchograms. There is a small left pleural effusion. Small areas of subpleural densities in the right lower lobe and lingula may represent atelectasis or pneumonia. There  is no pneumothorax. Endotracheal tube approximately 3 cm above the carina. The central airways are patent. Upper Abdomen: Cirrhosis. Musculoskeletal: Osteopenia with degenerative changes spine. Compression fractures of the inferior T9 and superior T10 endplates as seen on the prior CT. No acute osseous pathology. Review of the MIP images confirms the above findings. IMPRESSION: 1. No CT evidence of central pulmonary artery embolus. 2. Left lower lobe pneumonia.  Follow-up to resolution recommended. 3. Cirrhosis. 4.  Aortic Atherosclerosis (ICD10-I70.0).  Electronically Signed   By: Angus Bark M.D.   On: 10/31/2023 15:01   DG Chest Port 1 View Result Date: 10/31/2023 CLINICAL DATA:  Acute respiratory failure. EXAM: PORTABLE CHEST 1 VIEW COMPARISON:  10/29/2023 FINDINGS: Endotracheal tube in satisfactory position. Left jugular catheter tip in the superior vena cava. Stable dense left lower lobe airspace opacity. Interval small amount of linear atelectasis in the left mid lung zone. Clear right lung. Nasogastric tube extending into the stomach. Mild scoliosis. IMPRESSION: 1. Stable dense left lower lobe atelectasis or pneumonia. 2. Interval small amount of linear atelectasis in the left mid lung zone. 3. Support apparatus in satisfactory position. Electronically Signed   By: Catherin Closs M.D.   On: 10/31/2023 11:40   CT HEAD WO CONTRAST ( ) Result Date: 10/29/2023 CLINICAL DATA:  73 year old female with multifocal intracranial hemorrhage status post fall. EXAM: CT HEAD WITHOUT CONTRAST TECHNIQUE: Contiguous axial images were obtained from the base of the skull through the vertex without intravenous contrast. RADIATION DOSE REDUCTION: This exam was performed according to the departmental dose-optimization program which includes automated exposure control, adjustment of the mA and/or kV according to patient size and/or use of iterative reconstruction technique. COMPARISON:  Head CT yesterday and earlier. FINDINGS: Brain: There remains no midline shift. Confluent hemorrhagic contusions in the right anterior and inferior frontal and right anterior and lateral temporal lobes have not significantly changed. Small volume bilateral subarachnoid hemorrhage and questionable smaller left inferior temporal lobe hemorrhagic contusion (versus age coronal image 41) are stable. Superimposed hyperdense right anterior convexity small subdural hematoma and more broad-based but generally low-density left side subdural hematoma (4 mm) are stable. Confluent left inferior  cerebellar hemorrhagic contusion with edema is stable. Basilar cisterns remain patent. Stable gray-white matter differentiation throughout the brain. No cortically based acute infarct identified. Vascular: Calcified atherosclerosis at the skull base. Skull: Stable nondisplaced left posterior convexity skull fracture extending to the left foramen magnum. Sinuses/Orbits: Visualized paranasal sinuses and mastoids are stable and well aerated. Other: Intubated and oral enteric tube partially visible. Left posterior convexity, right lateral convexity scalp hematomas which are not significantly changed. Orbits soft tissues appears stable and negative. Scalp EEG leads remain. IMPRESSION: 1. Stable extensive multifocal posttraumatic intracranial hemorrhage since yesterday. No midline shift. Basilar cisterns remain patent. 2. No new intracranial abnormality. 3. Stable nondisplaced left posterior skull fracture. Scalp hematomas. Electronically Signed   By: Marlise Simpers M.D.   On: 10/29/2023 09:24   DG Chest Port 1 View Result Date: 10/29/2023 CLINICAL DATA:  [id number]. Acute respiratory failure, ventilator dependent. EXAM: PORTABLE CHEST 1 VIEW COMPARISON:  Portable chest yesterday at 8:13 a.m. FINDINGS: 4:13 a.m. ETT tip is 5.4 cm from the carina, previously 4.1 cm. Left IJ line terminates at the superior cavoatrial junction. NGT enters the stomach with the intragastric course not evaluated. The heart is slightly enlarged. No vascular congestion is seen. Stable mediastinum with aortic atherosclerosis. Dense left lower lobe consolidation continues to be seen and there are bilateral minimal pleural effusions. The lungs  are otherwise clear. Overall aeration seems unchanged. Osteopenia and thoracic spondylosis. IMPRESSION: 1. ETT tip 5.4 cm from the carina, previously 4.1 cm. 2. Left IJ line terminates at the superior cavoatrial junction. 3. NGT enters the stomach with the intragastric course not evaluated. 4. Dense left lower lobe  consolidation and bilateral minimal pleural effusions. 5. Aortic atherosclerosis. Electronically Signed   By: Denman Fischer M.D.   On: 10/29/2023 04:46   CT HEAD WO CONTRAST ( ) Result Date: 10/28/2023 CLINICAL DATA:  73 year old female with multifocal intracranial hemorrhage, status post fall. EXAM: CT HEAD WITHOUT CONTRAST TECHNIQUE: Contiguous axial images were obtained from the base of the skull through the vertex without intravenous contrast. RADIATION DOSE REDUCTION: This exam was performed according to the departmental dose-optimization program which includes automated exposure control, adjustment of the mA and/or kV according to patient size and/or use of iterative reconstruction technique. COMPARISON:  Head CTs yesterday and earlier. FINDINGS: Brain: Right side anterior frontal convexity predominant mostly hyperdense subdural hematoma is 5-6 mm, stable. Superimposed confluent right inferior frontal gyrus and right anterior temporal lobe hemorrhagic contusions, in an area of slightly larger than 4 cm, stable. Surrounding edema stable. Bilateral subarachnoid hemorrhage, most apparent along the sylvian fissures, asymmetrically greater on the right, stable. Left inferolateral cerebellar hemorrhagic contusion which is about 2.5 cm, stable. Small volume hyperdense blood layering on the tentorium, stable. A smaller predominantly low-density left side subdural hematoma measuring up to 4 mm is new since presentation and increased since yesterday, most apparent on coronal image 47. Small volume IVH limited to the 4th ventricle is stable. No ventriculomegaly. No significant midline shift. Basilar cisterns remain patent. Stable gray-white matter differentiation throughout the brain. No cortically based acute infarct identified. Vascular: Calcified atherosclerosis at the skull base. No suspicious intracranial vascular hyperdensity. Skull: Linear nondisplaced skull fracture tracking lateral to the left lambdoid  suture from the inferior parietal bone region through to the left foramen magnum. No new osseous abnormality identified. Sinuses/Orbits: Visualized paranasal sinuses and mastoids are stable and well aerated. Other: Large, broad-based left posterior convexity scalp hematoma does not appear significantly changed. Underlying nondisplaced skull fracture. Orbits soft tissues appears stable.  Bilateral scalp EEG leads now. IMPRESSION: 1. Extensive multifocal posttraumatic intracranial hemorrhage, stable since yesterday with EXCEPT FOR: Small 4 mm mixed but predominantly low-density left Subdural Hematoma has increased (coronal image 46). 2. No midline shift or significant intracranial mass effect despite the findings. No ventriculomegaly. Stable basilar cisterns. 3. Nondisplaced left posterior convexity skull fracture with large overlying scalp hematoma. Electronically Signed   By: Marlise Simpers M.D.   On: 10/28/2023 10:02   DG CHEST PORT 1 VIEW Result Date: 10/28/2023 CLINICAL DATA:  [id number] Endotracheal tube present [id number] EXAM: PORTABLE CHEST 1 VIEW COMPARISON:  10/28/2023, 6:19 a.m. FINDINGS: Re-demonstration of left retrocardiac airspace opacity obscuring the left hemidiaphragm, descending thoracic aorta and blunting the left lateral costophrenic angle, suggesting combination of left lung atelectasis and/or consolidation with pleural effusion. No significant interval change. Bilateral lung fields are clear. Bilateral costophrenic angles are clear. Stable cardio-mediastinal silhouette. No acute osseous abnormalities. The soft tissues are within normal limits. Tube/lines: Endotracheal tube is seen with its tip approximately 4.1 cm above the carina. Enteric tube is seen coursing below the left hemidiaphragm however, the tip and side hole are not included in the film. Left IJ central venous catheter noted with its tip overlying the cavoatrial junction region. IMPRESSION: 1. Repositioning of the endotracheal tube. 2.  Otherwise, no significant interval change since  the prior study. Stable left retrocardiac opacity, as described above. 3. Support apparatus as described above. Electronically Signed   By: Beula Brunswick M.D.   On: 10/28/2023 08:27   DG Abd Portable 1V Result Date: 10/28/2023 CLINICAL DATA:  [id number] Encounter for orogastric (OG) tube placement [id number]. EXAM: PORTABLE ABDOMEN - 1 VIEW COMPARISON:  None Available. FINDINGS: The bowel gas pattern is non-obstructive. No evidence of pneumoperitoneum, within the limitations of a supine film. No acute osseous abnormalities. The soft tissues are within normal limits. Surgical changes, devices, tubes and lines: Enteric tube is seen coursing below the left hemidiaphragm with its side hole overlying the left side of the lumbar spine (stomach body region) and tip overlying the right lower quadrant, overlying the region of pylorus of the stomach. IMPRESSION: *Enteric tube is seen coursing below the left hemidiaphragm with its side hole overlying the left side of the lumbar spine (stomach body region) and tip overlying the right lower quadrant, overlying the region of pylorus of the stomach. Electronically Signed   By: Beula Brunswick M.D.   On: 10/28/2023 08:25   DG Chest Port 1 View Addendum Date: 10/28/2023 ADDENDUM REPORT: 10/28/2023 07:19 ADDENDUM: Critical Value/emergent results were called by telephone at the time of interpretation on 10/28/2023 at 7:18 am to provider Dr. Waylan Haggard, who verbally acknowledged these results. Electronically Signed   By: Donnal Fusi M.D.   On: 10/28/2023 07:19   Result Date: 10/28/2023 CLINICAL DATA:  Acute intracranial hemorrhage. Ventilator dependence. EXAM: PORTABLE CHEST 1 VIEW COMPARISON:  Earlier same day. FINDINGS: Left base collapse/consolidation with small left pleural effusion is similar to prior. Endotracheal tube tip is in the right mainstem bronchus, about 17 mm below the bottom of the carina. Endotracheal tube could be  retracted approximately 4 cm to place the tip in the distal esophagus. Left IJ central line tip overlies the distal SVC level. The NG tube passes into the stomach although the distal tip position is not included on the film. Telemetry leads overlie the chest. IMPRESSION: 1. Endotracheal tube tip is in the right mainstem bronchus, about 17 mm below the bottom of the carina. Endotracheal tube could be retracted approximately 4 cm to place the tip in the distal esophagus. Repeat chest x-ray recommended after endotracheal tube repositioning. 2. Left base collapse/consolidation with small left pleural effusion, similar to prior. Electronically Signed: By: Donnal Fusi M.D. On: 10/28/2023 06:49   Overnight EEG with video Result Date: 10/28/2023 Arleene Lack, MD     10/29/2023  6:46 AM Patient Name: ANALYSE ANGST MRN: [id number] Epilepsy Attending: Arleene Lack Referring Physician/Provider: Josiah Nigh, MD Duration: 10/27/2023 1319 to 10/28/2023 1319 Patient history: 72yo F with right occipital fracture with acute subarachnoid hemorrhage, multiple contusions and a right subdural hematoma. EEG to evaluate for seizure Level of alertness: comatose/ lethargic AEDs during EEG study: LEV, propofol  Technical aspects: This EEG study was done with scalp electrodes positioned according to the 10-20 International system of electrode placement. Electrical activity was reviewed with band pass filter of 1-70Hz , sensitivity of 7 uV/mm, display speed of 34mm/sec with a 60Hz  notched filter applied as appropriate. EEG data were recorded continuously and digitally stored.  Video monitoring was available and reviewed as appropriate. Description: EEG showed continuous generalized and lateralized right hemisphere 3 to 6 Hz theta-delta slowing with overriding 12-14hz  beta activity. Hyperventilation and photic stimulation were not performed.   ABNORMALITY - Continuous slow, generalized and lateralized right hemisphere IMPRESSION:  This study is suggestive  of cortical dysfunction arising from right hemisphere likely secondary to underlying structural abnormality. Additionally there is moderate to severe diffuse encephalopathy likely related to sedation. No seizures or epileptiform discharges were seen throughout the recording. Priyanka Suzanne Erps   CT HEAD POST STROKE FOLLOWUP/TIMED/STAT READ Result Date: 10/27/2023 CLINICAL DATA:  Neuro deficit, acute, stroke suspected. EXAM: CT HEAD WITHOUT CONTRAST TECHNIQUE: Contiguous axial images were obtained from the base of the skull through the vertex without intravenous contrast. RADIATION DOSE REDUCTION: This exam was performed according to the departmental dose-optimization program which includes automated exposure control, adjustment of the mA and/or kV according to patient size and/or use of iterative reconstruction technique. COMPARISON:  Prior head CT examinations 10/27/2023 and earlier. FINDINGS: Brain: An acute subdural hematoma along the right cerebral convexity has undergone some interval redistribution but has not significantly changed in overall extent. The hematoma now measures up to 8 mm in thickness, and as before, is greatest along the right frontal lobe. Continued slight interval increase in extent of acute subarachnoid hemorrhage scattered along the right cerebral convexity. Progressive edema at sites of hemorrhagic parenchymal contusions within the anteroinferior right frontal lobe and anterior right temporal lobe. Small-volume subarachnoid hemorrhage scattered along the left cerebral convexity has mildly increased. Possible small underlying hemorrhagic parenchymal contusions within the anterolateral left temporal lobe, as before. Thin acute subdural hemorrhage layering along the tentorium bilaterally, unchanged. 2.3 x 2.6 cm acute parenchymal hemorrhage within the left cerebellar hemisphere, unchanged in size. Trace subarachnoid hemorrhage again questioned at the inferior aspect  of the fourth ventricle (versus choroid plexus) (for instance as seen on series 5, image 28). No midline shift or hydrocephalus. Vascular: No hyperdense vessel.  Atherosclerotic calcifications. Skull: Known non-depressed fracture of the left occipital calvarium. Sinuses/Orbits: No mass or acute finding within the imaged orbits. Minimal mucosal thickening within the bilateral maxillary sinuses at the imaged levels. Minimal mucosal thickening within the bilateral sphenoid and right ethmoid sinuses. Other: Posterior scalp hematomas. IMPRESSION: 1. An acute subdural hematoma along the right cerebral convexity has undergone some interval redistribution but has not significantly changed in overall extent. 2. Continued slight interval increase in extent of acute subarachnoid hemorrhage scattered along the right cerebral convexity. 3. Progressive edema at sites of hemorrhagic parenchymal contusions within the anteroinferior right frontal lobe and anterior right temporal lobe. 4. Small-volume subarachnoid hemorrhage scattered along the left cerebral convexity has mildly increased. Possible small underlying hemorrhagic parenchymal contusions within the anterolateral left temporal lobe, as before. 5. Thin acute subdural hemorrhage layering along the tentorium bilaterally, unchanged. 6. 2.3 x 2.6 cm acute parenchymal hemorrhage within the left cerebellar hemisphere, unchanged. 7. Trace subarachnoid hemorrhage again questioned at the inferior aspect of the fourth ventricle. 8. No midline shift or hydrocephalus. 9. Known non-depressed fracture of the left occipital calvarium. Electronically Signed   By: Bascom Lily D.O.   On: 10/27/2023 16:39   ECHOCARDIOGRAM COMPLETE Result Date: 10/27/2023    ECHOCARDIOGRAM REPORT   Patient Name:   YAREMI STAHLMAN Date of Exam: 10/27/2023 Medical Rec #:  [id number]     Height:       64.0 in Accession #:    [id number]    Weight:       121.3 lb Date of Birth:  20-Apr-1951    BSA:          1.582 m  Patient Age:    72 years      BP:           186/91 mmHg  Patient Gender: F             HR:           103 bpm. Exam Location:  Inpatient Procedure: 2D Echo, Cardiac Doppler and Color Doppler (Both Spectral and Color            Flow Doppler were utilized during procedure). Indications:    R55 Syncope  History:        Patient has prior history of Echocardiogram examinations, most                 recent 11/11/2020. Stroke; Risk Factors:Hypertension,                 Dyslipidemia and Diabetes. SDH.  Sonographer:    Raynelle Callow RDCS Referring Phys: [phone] Tania Familia [facility]  Sonographer Comments: Technically difficult study due to poor echo windows and echo performed with patient supine and on artificial respirator. Patient with EEG adhesive leads in parasternal region. Patient intubated, restrained and moving throughout exam. IMPRESSIONS  1. Left ventricular ejection fraction, by estimation, is 65 to 70%. The left ventricle has normal function. The left ventricle has no regional wall motion abnormalities. There is severe left ventricular hypertrophy of the basal-septal segment. Left ventricular diastolic parameters are indeterminate. Elevated left atrial pressure.  2. Right ventricular systolic function is normal. The right ventricular size is normal. There is normal pulmonary artery systolic pressure. The estimated right ventricular systolic pressure is 28.1 mmHg.  3. The mitral valve is normal in structure. Trivial mitral valve regurgitation. No evidence of mitral stenosis.  4. The aortic valve has an indeterminant number of cusps. There is moderate calcification of the aortic valve. There is moderate thickening of the aortic valve. Aortic valve regurgitation is not visualized. Moderate aortic valve stenosis. Aortic valve area, by VTI measures 1.10 cm. Aortic valve mean gradient measures 32.0 mmHg. Aortic valve Vmax measures 3.26 m/s.  5. The inferior vena cava is dilated in size with >50% respiratory variability,  suggesting right atrial pressure of 8 mmHg. FINDINGS  Left Ventricle: Left ventricular ejection fraction, by estimation, is 65 to 70%. The left ventricle has normal function. The left ventricle has no regional wall motion abnormalities. The left ventricular internal cavity size was normal in size. There is  severe left ventricular hypertrophy of the basal-septal segment. Left ventricular diastolic parameters are indeterminate. Elevated left atrial pressure. Right Ventricle: The right ventricular size is normal. No increase in right ventricular wall thickness. Right ventricular systolic function is normal. There is normal pulmonary artery systolic pressure. The tricuspid regurgitant velocity is 2.24 m/s, and  with an assumed right atrial pressure of 8 mmHg, the estimated right ventricular systolic pressure is 28.1 mmHg. Left Atrium: Left atrial size was normal in size. Right Atrium: Right atrial size was normal in size. Pericardium: There is no evidence of pericardial effusion. Mitral Valve: The mitral valve is normal in structure. Mild to moderate mitral annular calcification. Trivial mitral valve regurgitation. No evidence of mitral valve stenosis. MV peak gradient, 6.2 mmHg. The mean mitral valve gradient is 3.0 mmHg. Tricuspid Valve: The tricuspid valve is normal in structure. Tricuspid valve regurgitation is not demonstrated. No evidence of tricuspid stenosis. Aortic Valve: The aortic valve has an indeterminant number of cusps. There is moderate calcification of the aortic valve. There is moderate thickening of the aortic valve. Aortic valve regurgitation is not visualized. Moderate aortic stenosis is present.  Aortic valve mean gradient measures 32.0 mmHg. Aortic valve peak gradient  measures 42.6 mmHg. Aortic valve area, by VTI measures 1.10 cm. Pulmonic Valve: The pulmonic valve was normal in structure. Pulmonic valve regurgitation is not visualized. No evidence of pulmonic stenosis. Aorta: The aortic root is  normal in size and structure. Venous: The inferior vena cava is dilated in size with greater than 50% respiratory variability, suggesting right atrial pressure of 8 mmHg. IAS/Shunts: No atrial level shunt detected by color flow Doppler.  LEFT VENTRICLE PLAX 2D LVIDd:         3.70 cm     Diastology LVIDs:         2.40 cm     LV e' medial:    4.57 cm/s LV PW:         0.70 cm     LV E/e' medial:  20.0 LV IVS:        1.80 cm     LV e' lateral:   5.66 cm/s LVOT diam:     2.10 cm     LV E/e' lateral: 16.2 LV SV:         60 LV SV Index:   38 LVOT Area:     3.46 cm  LV Volumes (MOD) LV vol d, MOD A2C: 77.1 ml LV vol d, MOD A4C: 68.4 ml LV vol s, MOD A2C: 21.9 ml LV vol s, MOD A4C: 25.9 ml LV SV MOD A2C:     55.2 ml LV SV MOD A4C:     68.4 ml LV SV MOD BP:      53.0 ml RIGHT VENTRICLE             IVC RV S prime:     14.70 cm/s  IVC diam: 2.10 cm TAPSE (M-mode): 2.1 cm LEFT ATRIUM             Index        RIGHT ATRIUM           Index LA diam:        3.30 cm 2.09 cm/m   RA Area:     10.80 cm LA Vol (A2C):   21.5 ml 13.59 ml/m  RA Volume:   21.60 ml  13.66 ml/m LA Vol (A4C):   40.5 ml 25.61 ml/m LA Biplane Vol: 30.7 ml 19.41 ml/m  AORTIC VALVE AV Area (Vmax):    1.18 cm AV Area (Vmean):   1.09 cm AV Area (VTI):     1.10 cm AV Vmax:           326.25 cm/s AV Vmean:          232.750 cm/s AV VTI:            0.543 m AV Peak Grad:      42.6 mmHg AV Mean Grad:      32.0 mmHg LVOT Vmax:         111.00 cm/s LVOT Vmean:        73.100 cm/s LVOT VTI:          0.172 m LVOT/AV VTI ratio: 0.32  AORTA Ao Root diam: 3.50 cm Ao Asc diam:  3.45 cm MITRAL VALVE                TRICUSPID VALVE MV Area (PHT): 4.37 cm     TR Peak grad:   20.1 mmHg MV Area VTI:   2.64 cm     TR Vmax:        224.00 cm/s MV Peak grad:  6.2 mmHg MV Mean grad:  3.0 mmHg     SHUNTS MV Vmax:       1.25 m/s     Systemic VTI:  0.17 m MV Vmean:      79.1 cm/s    Systemic Diam: 2.10 cm MV Decel Time: 174 msec MV E velocity: 91.50 cm/s MV A velocity: 113.67 cm/s MV  E/A ratio:  0.80 Gaylyn Keas MD Electronically signed by Gaylyn Keas MD Signature Date/Time: 10/27/2023/3:21:35 PM    Final    DG Abdomen 1 View Result Date: 10/27/2023 CLINICAL DATA:  OG tube placement EXAM: ABDOMEN - 1 VIEW COMPARISON:  Oct 26, 2023 FINDINGS: Esophagogastric tube terminates in the stomach. Nonobstructive bowel gas pattern. No pneumoperitoneum. No organomegaly or radiopaque calculi. Multilevel thoracic osteophytosis. Atherosclerosis. IMPRESSION: Well-positioned esophagogastric tube terminates in the stomach. Electronically Signed   By: Rance Burrows M.D.   On: 10/27/2023 12:04   DG Chest Portable 1 View Result Date: 10/27/2023 CLINICAL DATA:  ETT tube placement EXAM: PORTABLE CHEST - 1 VIEW COMPARISON:  Oct 26, 2023 FINDINGS: Suboptimal evaluation of the endotracheal tube due to overlying cardiac wires. It appears that the tip of the endotracheal tube is within the ostium of the right mainstem bronchus. Left IJ approach central venous catheter terminates at the cavoatrial junction. Esophagogastric tube courses below the diaphragm with the distal tip not included in the field of view. The last side hole overlies the expected region of the stomach. Trace left pleural effusion. Streaky left basilar atelectasis. No pneumothorax. No cardiomegaly. Tortuous aorta with aortic atherosclerosis. IMPRESSION: Low lying endotracheal tube, which is suboptimally evaluated due to overlying defibrillator pad and cardiac wires. It appears that the tip of the endotracheal tube is within the ostium of the right mainstem bronchus. Repeat chest radiograph recommended for further characterization after 2 cm of retraction. Well-positioned left IJ central venous catheter.  No pneumothorax. Electronically Signed   By: Rance Burrows M.D.   On: 10/27/2023 12:03   CT HEAD WO CONTRAST ( ) Addendum Date: 10/27/2023 ADDENDUM REPORT: 10/27/2023 09:03 ADDENDUM: These results were called by telephone at the time of  interpretation on 10/27/2023 at 9:03 am to provider Dr. Nichole Barker, who verbally acknowledged these results. Electronically Signed   By: Bascom Lily D.O.   On: 10/27/2023 09:03   Result Date: 10/27/2023 CLINICAL DATA:  Head trauma, intracranial arterial injury suspected. EXAM: CT HEAD WITHOUT CONTRAST TECHNIQUE: Contiguous axial images were obtained from the base of the skull through the vertex without intravenous contrast. RADIATION DOSE REDUCTION: This exam was performed according to the departmental dose-optimization program which includes automated exposure control, adjustment of the mA and/or kV according to patient size and/or use of iterative reconstruction technique. COMPARISON:  Head CT 10/26/2023. FINDINGS: Brain: Interval increase in size of an acute subdural hematoma along the right cerebral convexity (greatest along the anterior right frontal lobe), now measuring up to 9 mm in thickness (previously 7 mm). Acute subarachnoid hemorrhage scattered along the right cerebral convexity, increased. Hemorrhagic parenchymal contusions within the anterior right temporal lobe and anteroinferior right frontal lobe have increased in size. For instance, the largest hemorrhagic parenchymal contusion within the anterior right temporal lobe now measures 18 mm (previously 11 mm) (series 2, image 16). Trace acute subarachnoid hemorrhage is present along the anterolateral left temporal lobe, new from the prior exam (series 2, image 15). Possible small underlying left temporal lobe hemorrhagic parenchymal contusions. Thin subdural hemorrhage layering along the tentorium, bilaterally, similar to the prior exam. A 2.3 x 2.6 cm hemorrhagic parenchymal  contusion within the left cerebellar hemisphere has increased in size (previously 2.1 x 1.8 cm). Trace subarachnoid hemorrhage questioned within the inferior aspect of the fourth ventricle (versus prominent choroid plexus). No midline shift or hydrocephalus. Vascular: No hyperdense  vessel.  Atherosclerotic calcifications. Skull: Non-depressed fracture of the left occipital calvarium again noted. Sinuses/Orbits: No orbital mass or acute orbital finding. Mild mucosal thickening within the right ethmoid and left maxillary sinuses. Other: Left occipital scalp hematoma and laceration. Attempts are being made to reach the ordering provider at this time. IMPRESSION: 1. Interval increase in size of an acute subdural hematoma along the right cerebral convexity (greatest along the anterior right frontal lobe), now measuring up to 9 mm in thickness (previously 7 mm). 2. Acute subarachnoid hemorrhage scattered along the right cerebral convexity, increased. 3. Hemorrhagic parenchymal contusions within the anterior right temporal lobe and anteroinferior right frontal lobe have increased in size. 4. Trace acute subarachnoid hemorrhage along the anterolateral left temporal lobe, new from the prior exam. Possible small underlying left temporal lobe hemorrhagic parenchymal contusions. 5. A 2.3 x 2.6 cm hemorrhagic parenchymal contusion within the left cerebellar hemisphere has increased in size (previously 2.1 x 1.8 cm). 6. Thin subdural hemorrhage layering along the tentorium, bilaterally, similar to the prior exam. 7. Trace subarachnoid hemorrhage (versus prominent choroid plexus) within the inferior aspect of the fourth ventricle. 8. Non-depressed fracture of the left occipital calvarium. 9. Left occipital scalp hematoma and laceration. Electronically Signed: By: Bascom Lily D.O. On: 10/27/2023 08:44   CT HEAD WO CONTRAST Result Date: 10/26/2023 CLINICAL DATA:  Fall EXAM: CT HEAD WITHOUT CONTRAST CT CERVICAL SPINE WITHOUT CONTRAST TECHNIQUE: Multidetector CT imaging of the head and cervical spine was performed following the standard protocol without intravenous contrast. Multiplanar CT image reconstructions of the cervical spine were also generated. RADIATION DOSE REDUCTION: This exam was performed  according to the departmental dose-optimization program which includes automated exposure control, adjustment of the mA and/or kV according to patient size and/or use of iterative reconstruction technique. COMPARISON:  None Available. FINDINGS: CT HEAD FINDINGS Brain: There is mixed subdural and subarachnoid hemorrhage of the right hemisphere, predominantly anterior. There is a small amount of subdural blood layering along the falx cerebrum. No midline shift or other mass effect. No hydrocephalus. Vascular: Atherosclerotic calcification of the vertebral and internal carotid arteries at the skull base. No abnormal hyperdensity of the major intracranial arteries or dural venous sinuses. Skull: Large left posterior scalp hematoma. Nondisplaced left occipital skull fracture. Sinuses/Orbits: No fluid levels or advanced mucosal thickening of the visualized paranasal sinuses. No mastoid or middle ear effusion. Normal orbits. Other: None. CT CERVICAL SPINE FINDINGS Alignment: Grade 1 anterolisthesis at C4-5. Skull base and vertebrae: No acute fracture. Soft tissues and spinal canal: No prevertebral fluid or swelling. No visible canal hematoma. Disc levels: No advanced spinal canal or neural foraminal stenosis. Upper chest: No pneumothorax, pulmonary nodule or pleural effusion. Other: Normal visualized paraspinal cervical soft tissues. IMPRESSION: 1. Mixed subdural and subarachnoid hemorrhage of the right hemisphere, predominantly anterior. No midline shift or other mass effect. 2. Nondisplaced left occipital skull fracture with large left posterior scalp hematoma. 3. No acute fracture or static subluxation of the cervical spine. Critical Value/emergent results were called by telephone at the time of interpretation on 10/26/2023 at 11:50 pm to provider JONATHAN GILLIAM , who verbally acknowledged these results. Electronically Signed   By: Juanetta Nordmann M.D.   On: 10/26/2023 23:51   CT CERVICAL SPINE WO CONTRAST Result  Date: 10/26/2023 CLINICAL DATA:  Fall EXAM: CT HEAD WITHOUT CONTRAST CT CERVICAL SPINE WITHOUT CONTRAST TECHNIQUE: Multidetector CT imaging of the head and cervical spine was performed following the standard protocol without intravenous contrast. Multiplanar CT image reconstructions of the cervical spine were also generated. RADIATION DOSE REDUCTION: This exam was performed according to the departmental dose-optimization program which includes automated exposure control, adjustment of the mA and/or kV according to patient size and/or use of iterative reconstruction technique. COMPARISON:  None Available. FINDINGS: CT HEAD FINDINGS Brain: There is mixed subdural and subarachnoid hemorrhage of the right hemisphere, predominantly anterior. There is a small amount of subdural blood layering along the falx cerebrum. No midline shift or other mass effect. No hydrocephalus. Vascular: Atherosclerotic calcification of the vertebral and internal carotid arteries at the skull base. No abnormal hyperdensity of the major intracranial arteries or dural venous sinuses. Skull: Large left posterior scalp hematoma. Nondisplaced left occipital skull fracture. Sinuses/Orbits: No fluid levels or advanced mucosal thickening of the visualized paranasal sinuses. No mastoid or middle ear effusion. Normal orbits. Other: None. CT CERVICAL SPINE FINDINGS Alignment: Grade 1 anterolisthesis at C4-5. Skull base and vertebrae: No acute fracture. Soft tissues and spinal canal: No prevertebral fluid or swelling. No visible canal hematoma. Disc levels: No advanced spinal canal or neural foraminal stenosis. Upper chest: No pneumothorax, pulmonary nodule or pleural effusion. Other: Normal visualized paraspinal cervical soft tissues. IMPRESSION: 1. Mixed subdural and subarachnoid hemorrhage of the right hemisphere, predominantly anterior. No midline shift or other mass effect. 2. Nondisplaced left occipital skull fracture with large left posterior scalp  hematoma. 3. No acute fracture or static subluxation of the cervical spine. Critical Value/emergent results were called by telephone at the time of interpretation on 10/26/2023 at 11:50 pm to provider JONATHAN GILLIAM , who verbally acknowledged these results. Electronically Signed   By: Juanetta Nordmann M.D.   On: 10/26/2023 23:51   CT CHEST ABDOMEN PELVIS W CONTRAST Result Date: 10/26/2023 CLINICAL DATA:  Blunt trauma, loss of balance, fell backwards and hit head, hypertension EXAM: CT CHEST, ABDOMEN, AND PELVIS WITH CONTRAST TECHNIQUE: Multidetector CT imaging of the chest, abdomen and pelvis was performed following the standard protocol during bolus administration of intravenous contrast. RADIATION DOSE REDUCTION: This exam was performed according to the departmental dose-optimization program which includes automated exposure control, adjustment of the mA and/or kV according to patient size and/or use of iterative reconstruction technique. CONTRAST:  75mL OMNIPAQUE  IOHEXOL  350 MG/ML SOLN COMPARISON:  06/16/2016, 06/05/2023 FINDINGS: CT CHEST FINDINGS Cardiovascular: The heart is enlarged without pericardial effusion. No evidence of thoracic aortic aneurysm or dissection. No evidence of vascular injury. Atherosclerosis of the aorta and coronary vasculature. Mediastinum/Nodes: No enlarged mediastinal, hilar, or axillary lymph nodes. Thyroid  gland, trachea, and esophagus demonstrate no significant findings. Lungs/Pleura: No acute airspace disease, effusion, or pneumothorax. Central airways are patent. Musculoskeletal: There is an acute appearing fracture through the superior endplate of the T10 vertebral body, with less than 10% loss of height. No retropulsion. No other acute bony abnormalities. Chronic appearing compression deformity within the inferior endplate of the T9 vertebral body. Reconstructed images demonstrate no additional findings. CT ABDOMEN PELVIS FINDINGS Hepatobiliary: Nodular contour of the liver  consistent with cirrhosis. No focal parenchymal liver abnormality. No evidence of hepatic injury. The gallbladder is unremarkable. No biliary duct dilation. Pancreas: Unremarkable. No pancreatic ductal dilatation or surrounding inflammatory changes. Spleen: The spleen is enlarged, measuring 11.8 x 11.7 x 6.0 cm. No focal parenchymal abnormality. Adrenals/Urinary Tract: No adrenal  hemorrhage or renal injury identified. Bladder is unremarkable. Stomach/Bowel: No bowel obstruction or ileus. No bowel wall thickening or inflammatory change. Vascular/Lymphatic: Aortic atherosclerosis. No enlarged abdominal or pelvic lymph nodes. Reproductive: Multiple calcified uterine fibroids. No adnexal masses. Other: No free fluid or free intraperitoneal gas. Small fat containing umbilical hernia. Musculoskeletal: Chronic appearing compression deformities are seen at T12, L2, L3, L4, and L5, with no significant change since the prior exam. Reconstructed images demonstrate no additional findings. IMPRESSION: 1. Acute appearing compression deformity through the superior endplate of the T10 vertebral body, with less than 10% loss of vertebral body height. No retropulsion. 2. Otherwise no acute intrathoracic, intra-abdominal, or intrapelvic trauma. 3. 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 4. Other chronic thoracolumbar compression deformities as above. 5.  Aortic Atherosclerosis (ICD10-I70.0). 6. Cardiomegaly without pericardial effusion. Electronically Signed   By: Bobbye Burrow M.D.   On: 10/26/2023 23:38   DG Chest Port 1 View Result Date: 10/26/2023 CLINICAL DATA:  Fall, chest are EXAM: PORTABLE CHEST 1 VIEW COMPARISON:  None Available. FINDINGS: Lungs are well expanded, symmetric, and clear. No pneumothorax or pleural effusion. Cardiac size within normal limits. Pulmonary vascularity is normal. Osseous structures are age-appropriate. No acute bone abnormality. IMPRESSION: 1. No active disease.  Electronically Signed   By: Worthy Heads M.D.   On: 10/26/2023 22:26    Microbiology: No results found for this or any previous visit (from the past 240 hours).   Labs: Basic Metabolic Panel: Recent Labs  Lab 11/09/23 0437 11/10/23 0556 11/11/23 0513 11/12/23 0731 11/13/23 0638  NA 136 134* 138 137 135  K 3.8 4.1 3.1* 3.8 4.1  CL 104 106 106 106 102  CO2 21* 20* 21* 19* 24  GLUCOSE 191* 308* 165* 160* 178*  BUN 15 15 14 15 20   CREATININE 0.44 0.51 0.50 0.50 0.52  CALCIUM  8.6* 8.3* 8.6* 8.4* 8.5*  MG 1.9 2.0 2.2  --   --   PHOS 3.1 3.3 4.0  --   --    Liver Function Tests: Recent Labs  Lab 11/09/23 0437 11/10/23 0556 11/11/23 0513 11/12/23 0731  AST 66* 57* 55* 52*  ALT 61* 54* 49* 45*  ALKPHOS 248* 248* 238* 231*  BILITOT 0.7 0.8 0.6 0.8  PROT 6.8 6.9 7.3 7.0  ALBUMIN 2.7* 2.7* 3.0* 2.9*   No results for input(s): "LIPASE", "AMYLASE" in the last 168 hours. Recent Labs  Lab 11/09/23 0437  AMMONIA <13   CBC: Recent Labs  Lab 11/09/23 0437 11/10/23 0556 11/11/23 0513 11/12/23 0731 11/13/23 0638  WBC 6.5 6.6 6.9 7.6 7.5  NEUTROABS 4.1 4.0 4.3 4.0  --   HGB 10.2* 10.6* 11.2* 11.1* 11.2*  HCT 31.5* 32.8* 35.4* 35.7* 34.7*  MCV 100.0 99.4 101.1* 100.6* 97.7  PLT 259 267 292 370 344   Cardiac Enzymes: No results for input(s): "CKTOTAL", "CKMB", "CKMBINDEX", "TROPONINI" in the last 168 hours. BNP: BNP (last 3 results) Recent Labs    11/09/23 0437 11/10/23 0556 11/11/23 0513  BNP 129.4* 84.6 62.0    ProBNP (last 3 results) No results for input(s): "PROBNP" in the last 8760 hours.  CBG: Recent Labs  Lab 11/14/23 1159 11/14/23 1615 11/14/23 2058 11/15/23 0618 11/15/23 1054  GLUCAP 264* 127* 113* 99 264*       Signed:  Raymonde Calico MD.  [facility] 11/15/2023, 1:47 PM

## 2023-11-15 NOTE — Progress Notes (Signed)
 Inpatient Rehabilitation Discharge Medication Review by a Pharmacist on 11/13/2023   A complete drug regimen review was completed for this patient to identify any potential clinically significant medication issues.  High Risk Drug Classes Is patient taking? Indication by Medication  Antipsychotic No   Anticoagulant No   Antibiotic No   Opioid Yes Tramadol  prn pain  Antiplatelet Yes ASA - PAD, CVA  Hypoglycemics/insulin  Yes Insulin  aspart SSI, Semglee  insulin - DM  Vasoactive Medication Yes Amiodarone  - Afib metoprolol - BP  Chemotherapy No   Other Yes Ezetimibe  - HLD Levetiracetam  - seizures Pantoprazole  - reflux  Prednisone  - hepatitis  Thiamine , cholecalciferol , cyanocobalamin  - supplement Naloxone NS -prn overdose      Type of Medication Issue Identified Description of Issue Recommendation(s)  Drug Interaction(s) (clinically significant)     Duplicate Therapy     Allergy     No Medication Administration End Date     Incorrect Dose     Additional Drug Therapy Needed     Significant med changes from prior encounter (inform family/care partners about these prior to discharge).    Other       Clinically significant medication issues were identified that warrant physician communication and completion of prescribed/recommended actions by midnight of the next day:  No  Name of provider notified for urgent issues identified:   Provider Method of Notification:   Pharmacist comments: None  Time spent performing this drug regimen review (minutes):20   Alisa Irish, RPh Clinical Pharmacist 11/15/2023 3:02 PM (for CIR discharge on 11/13/2023)

## 2023-11-15 NOTE — Progress Notes (Addendum)
 Pt being discharged, pt refused CIR MD Wouk educated the patient about that. No IV to remove pt removed herself this am. AVS education complete. No other needs required at this time. Pt is a high fall risk and her husband was educated by this RN about that risk.  Dayvion Sans C Warden, RN 11/15/2023 2:12 PM   Home Health was ordered for pt after refusing CIR, Case Manager is working on setting up and will call pt with update.

## 2023-11-15 NOTE — Plan of Care (Signed)

## 2023-11-15 NOTE — Progress Notes (Signed)
 Inpatient Rehab Admissions Coordinator:   Per Dr. Sari Cunning, pt medically stable to return to CIR once insurance approves.  I will submit today for re-auth.   Loye Rumble, PT, DPT Admissions Coordinator [phone] 11/15/23  8:50 AM

## 2023-11-15 NOTE — TOC Transition Note (Signed)
 Transition of Care [facility]) - Discharge Note Sherin Dingwall RN, BSN Transitions of Care Unit 4E- RN Case Manager See Treatment Team for direct phone #   Patient Details  Name: Miranda Cochran MRN: [id number] Date of Birth: [birth year]  Transition of Care [facility]) CM/SW Contact:  Rox Cope, RN Phone Number: 11/15/2023, 2:29 PM   Clinical Narrative:    Tim Fontana from MD that pt and spouse have decided not to return to CIR and wish to return home. Orders placed for HHPT/OT.   CM in to speak with pt (volunteer getting ready to take pt down to meet spouse for transport home) Spouse no longer at bedside.  Confirmed with pt that she wants to return home and not return to CIR.  Pt voiced she is agreeable to [facility] therapy. List provided for [facility] choice Per CMS guidelines from [email or web address] website with star ratings (copy placed in shadow chart)- pt states she does not have preference.  Address, phone # and PCP all confirmed with pt-  CM will follow up with pt and spouse once [facility] referral confirmed.   Pt voiced she has DME at home- no DME needs.   Call made to [facility]- they are not in-network with insurance.  Call made to [facility]- referral has been accepted pending insurance verification- Liaison will contact spouse for start of care and review any copays.   CIR liaison aware of change in discharge plan.    Final next level of care: Home w Home Health Services Barriers to Discharge: Barriers Resolved   Patient Goals and CMS Choice Patient states their goals for this hospitalization and ongoing recovery are:: return home CMS Medicare.gov Compare Post Acute Care list provided to:: Patient Choice offered to / list presented to : Patient      Discharge Placement               Home w/ [facility]        Discharge Plan and Services Additional resources added to the After Visit Summary for     Discharge Planning Services: CM Consult Post Acute Care Choice: Home Health           DME Arranged: N/A DME Agency: NA       HH Arranged: PT, OT HH Agency: [facility] Date [facility] Agency Contacted: 11/15/23 Time HH Agency Contacted: 1428 Representative spoke with at [facility] Agency: Amy  Social Drivers of Health (SDOH) Interventions SDOH Screenings   Food Insecurity: No Food Insecurity (11/13/2023)  Housing: Low Risk  (11/13/2023)  Transportation Needs: No Transportation Needs (11/13/2023)  Utilities: Not At Risk (11/13/2023)  Alcohol Screen: Low Risk  (04/21/2023)  Depression (PHQ2-9): Low Risk  (08/29/2023)  Financial Resource Strain: Low Risk  (04/21/2023)  Physical Activity: Insufficiently Active (04/21/2023)  Social Connections: Socially Isolated (11/13/2023)  Stress: Stress Concern Present (04/21/2023)  Tobacco Use: Low Risk  (11/13/2023)     Readmission Risk Interventions    11/15/2023    2:29 PM  Readmission Risk Prevention Plan  Transportation Screening Complete  HRI or Home Care Consult Complete  Social Work Consult for Recovery Care Planning/Counseling Complete  Palliative Care Screening Not Applicable  Medication Review Oceanographer) Complete

## 2023-11-16 ENCOUNTER — Telehealth: Payer: Self-pay

## 2023-11-16 NOTE — Transitions of Care (Post Inpatient/ED Visit) (Signed)
 11/16/2023  Name: Miranda Cochran MRN: [id number] DOB: Sep 13, 1950  Today's TOC FU Call Status: Today's TOC FU Call Status:: Successful TOC FU Call Completed TOC FU Call Complete Date: 11/16/23 Patient's Name and Date of Birth confirmed.  Transition Care Management Follow-up Telephone Call Date of Discharge: 11/15/23 Discharge Facility: [facility]) Type of Discharge: Inpatient Admission Primary Inpatient Discharge Diagnosis:: tachycardia How have you been since you were released from the hospital?: Better Any questions or concerns?: No  Items Reviewed: Did you receive and understand the discharge instructions provided?: Yes Medications obtained,verified, and reconciled?: Yes (Medications Reviewed) Any new allergies since your discharge?: No Dietary orders reviewed?: Yes Do you have support at home?: Yes People in Home [RPT]: spouse  Medications Reviewed Today: Medications Reviewed Today     Reviewed by Darrall Ellison, LPN (Licensed Practical Nurse) on 11/16/23 at 1129  Med List Status: <None>   Medication Order Taking? Sig Documenting Provider Last Dose Status Informant  amiodarone  (PACERONE ) 200 MG tablet [id number]  Take 1 tablet (200 mg total) by mouth daily. Wouk, Haynes Lips, MD  Active   aspirin  81 MG chewable tablet [id number]  Chew 1 tablet (81 mg total) by mouth daily. Wouk, Haynes Lips, MD  Active   Cholecalciferol  25 MCG (1000 UT) Cassie Click [id number]  Chew 1 tablet (1,000 Units total) by mouth daily. Elgergawy, Ardia Kraft, MD  Active   Cyanocobalamin  500 MCG CHEW [id number] No Chew 500 mcg by mouth daily. [provider] 10/26/2023 Morning Active Self, Spouse/Significant Other, Pharmacy Records  ezetimibe  (ZETIA ) 10 MG tablet [id number]  Place 1 tablet (10 mg total) into feeding tube daily. TAKE 1 TABLET(10 MG) BY MOUTH DAILY Wouk, Haynes Lips, MD  Active   insulin  aspart (NOVOLOG ) 100 UNIT/ML injection [id number]  Inject 3 Units into the skin every 4 (four) hours.  Elgergawy, Ardia Kraft, MD  Active   insulin  aspart (NOVOLOG ) 100 UNIT/ML injection [id number]  Inject 0-15 Units into the skin every 4 (four) hours. Elgergawy, Ardia Kraft, MD  Active   insulin  glargine-yfgn (SEMGLEE ) 100 UNIT/ML injection [id number]  Inject 0.15 mLs (15 Units total) into the skin daily. Elgergawy, Ardia Kraft, MD  Active   levETIRAcetam  (KEPPRA ) 1000 MG tablet [id number]  Take 1 tablet (1,000 mg total) by mouth 2 (two) times daily. Wouk, Haynes Lips, MD  Active   metoprolol  tartrate (LOPRESSOR ) 50 MG tablet [id number]  Take 1 tablet (50 mg total) by mouth 2 (two) times daily. Wouk, Haynes Lips, MD  Active   Multiple Vitamin (MULTIVITAMIN PO) [id number] No Take by mouth. [provider] Past Week Active Self, Pharmacy Records, Spouse/Significant Other  naloxone [facility]) nasal spray 4 mg/0.1 mL [id number] No Spray the contents of 1 device into 1 nostril. Call 911. May repeat with 2nd device in alternate nostril if no response in 2-3 minutes. [provider] Unknown Active Self, Pharmacy Records, Spouse/Significant Other           Med Note (LEE, NICOLE   Thu Oct 27, 2023  3:19 AM) Has on hand at home.   predniSONE  (DELTASONE ) 10 MG tablet [id number] No Take 10 mg by mouth daily with breakfast. [provider] 10/26/2023 Morning Active Self, Pharmacy Records, Spouse/Significant Other  traMADol  (ULTRAM ) 50 MG tablet [id number]  Take 1 tablet (50 mg total) by mouth every 12 (twelve) hours as needed for moderate pain (pain score 4-6). Wouk, Haynes Lips, MD  Active             Home Care and  Equipment/Supplies: Were Home Health Services Ordered?: Yes Name of Home Health Agency:: unknown Has Agency set up a time to come to your home?: No Any new equipment or medical supplies ordered?: NA  Functional Questionnaire: Do you need assistance with bathing/showering or dressing?: Yes Do you need assistance with meal preparation?: Yes Do you need assistance with eating?:  No Do you have difficulty maintaining continence: No Do you need assistance with getting out of bed/getting out of a chair/moving?: No Do you have difficulty managing or taking your medications?: Yes  Follow up appointments reviewed: PCP Follow-up appointment confirmed?: Yes Date of PCP follow-up appointment?: 11/18/23 Follow-up Provider: [facility] Follow-up appointment confirmed?: No Reason Specialist Follow-Up Not Confirmed: Patient has Specialist Provider Number and will Call for Appointment Do you need transportation to your follow-up appointment?: No Do you understand care options if your condition(s) worsen?: Yes-patient verbalized understanding    SIGNATURE Darrall Ellison, LPN [facility] Nurse Health Advisor Direct Dial [phone]

## 2023-11-17 NOTE — IPOC Note (Signed)
 Overall Plan of Care [facility]) Patient Details Name: Miranda Cochran MRN: [id number] DOB: [birth year]  Admitting Diagnosis: TBI (traumatic brain injury) [facility])  Hospital Problems: Principal Problem:   TBI (traumatic brain injury) (HCC)     Functional Problem List: Nursing Behavior, Bladder, Bowel, Edema, Endurance, Medication Management, Motor, Nutrition, Pain, Perception, Safety, Skin Integrity  PT Balance, Skin Integrity, Behavior, Pain, Perception, Endurance, Safety, Motor  OT Balance, Behavior, Cognition, Endurance, Motor, Pain, Nutrition, Perception, Safety  SLP Cognition, Nutrition  TR         Basic ADL's: OT Bathing, Dressing, Toileting, Grooming, Eating     Advanced  ADL's: OT       Transfers: PT Bed Mobility, Bed to Chair, Car  OT Toilet, Tub/Shower     Locomotion: PT Ambulation, Psychologist, prison and probation services, Stairs     Additional Impairments: OT    SLP Social Cognition, Swallowing   Problem Solving, Attention, Memory, Awareness  TR      Anticipated Outcomes Item Anticipated Outcome  Self Feeding Supervision  Swallowing  supervision A   Basic self-care  Min A  Toileting  Min A   Bathroom Transfers CGA  Bowel/Bladder  manage bowels with medications/ manage bladder with toileting assistance  Transfers  CGA with LRAD  Locomotion  CGA with LRAD  Communication     Cognition  supervision A  Pain  <4 w/ prns  Safety/Judgment  manage safetywith minimal assistance   Therapy Plan: PT Intensity: Minimum of 1-2 x/day ,45 to 90 minutes PT Frequency: 5 out of 7 days PT Duration Estimated Length of Stay: ~3 weeks OT Intensity: Minimum of 1-2 x/day, 45 to 90 minutes OT Frequency: 5 out of 7 days OT Duration/Estimated Length of Stay: ~3 weeks SLP Intensity: Minumum of 1-2 x/day, 30 to 90 minutes SLP Frequency: 3 to 5 out of 7 days SLP Duration/Estimated Length of Stay: 14-18 days   Team Interventions: Nursing Interventions Patient/Family Education, Skin  Care/Wound Management, Bladder Management, Bowel Management, Disease Management/Prevention, Pain Management, Medication Management, Dysphagia/Aspiration Precaution Training, Cognitive Remediation/Compensation, Discharge Planning  PT interventions Ambulation/gait training, Cognitive remediation/compensation, Discharge planning, DME/adaptive equipment instruction, Functional mobility training, Pain management, Psychosocial support, Splinting/orthotics, Therapeutic Activities, UE/LE Strength taining/ROM, Visual/perceptual remediation/compensation, Wheelchair propulsion/positioning, UE/LE Coordination activities, Therapeutic Exercise, Stair training, Skin care/wound management, Patient/family education, Neuromuscular re-education, Functional electrical stimulation, Disease management/prevention, Firefighter, Warden/ranger  OT Interventions Warden/ranger, Cognitive remediation/compensation, Firefighter, Discharge planning, Disease mangement/prevention, Fish farm manager, Neuromuscular re-education, Functional mobility training, Functional electrical stimulation, Pain management, Psychosocial support, Patient/family education, Self Care/advanced ADL retraining, Skin care/wound managment, Splinting/orthotics, UE/LE Strength taining/ROM, Therapeutic Activities, UE/LE Coordination activities, Visual/perceptual remediation/compensation, Therapeutic Exercise, Wheelchair propulsion/positioning  SLP Interventions Cognitive remediation/compensation, Cueing hierarchy, Dysphagia/aspiration precaution training, Functional tasks, Internal/external aids, Patient/family education, Therapeutic Activities, Therapeutic Exercise  TR Interventions    SW/CM Interventions     Barriers to Discharge MD  Medical stability, Home enviroment access/loayout, Wound care, and Nutritional means  Nursing Decreased caregiver support, Home environment access/layout Discharge:  Apartment  Discharge Home Layout: One level  Discharge Home Access: Level entry  PT Home environment access/layout, Weight, Incontinence    OT Behavior, Nutrition means    SLP      SW       Team Discharge Planning: Destination: PT-Home ,OT- Home , SLP-Home Projected Follow-up: PT-24 hour supervision/assistance, OT-  Home health OT, SLP-Home Health SLP, Outpatient SLP Projected Equipment Needs: PT-To be determined, OT- To be determined, SLP-None recommended by SLP Equipment Details: PT- , OT-  Patient/family  involved in discharge planning: PT- Patient, Family member/caregiver,  OT-Patient, SLP-Patient  MD ELOS: N/A - discharged to acute care Medical Rehab Prognosis:  Fair Assessment: The patient has been admitted for CIR therapies with the diagnosis of TBI. The team will be addressing functional mobility, strength, stamina, balance, safety, adaptive techniques and equipment, self-care, bowel and bladder mgt, patient and caregiver education. Goals have been set at Min A. Anticipated discharge destination is acute care.       See Team Conference Notes for weekly updates to the plan of care

## 2023-11-18 ENCOUNTER — Ambulatory Visit: Admitting: Family Medicine

## 2023-11-18 ENCOUNTER — Encounter: Payer: Self-pay | Admitting: Family Medicine

## 2023-11-18 VITALS — BP 98/76 | HR 99 | Temp 98.3°F | Wt 116.0 lb

## 2023-11-18 DIAGNOSIS — E1165 Type 2 diabetes mellitus with hyperglycemia: Secondary | ICD-10-CM | POA: Diagnosis not present

## 2023-11-18 DIAGNOSIS — S069XAA Unspecified intracranial injury with loss of consciousness status unknown, initial encounter: Secondary | ICD-10-CM

## 2023-11-18 DIAGNOSIS — K7460 Unspecified cirrhosis of liver: Secondary | ICD-10-CM

## 2023-11-18 DIAGNOSIS — G4700 Insomnia, unspecified: Secondary | ICD-10-CM

## 2023-11-18 DIAGNOSIS — G40001 Localization-related (focal) (partial) idiopathic epilepsy and epileptic syndromes with seizures of localized onset, not intractable, with status epilepticus: Secondary | ICD-10-CM

## 2023-11-18 DIAGNOSIS — I471 Supraventricular tachycardia, unspecified: Secondary | ICD-10-CM

## 2023-11-18 DIAGNOSIS — K219 Gastro-esophageal reflux disease without esophagitis: Secondary | ICD-10-CM

## 2023-11-18 DIAGNOSIS — I6200 Nontraumatic subdural hemorrhage, unspecified: Secondary | ICD-10-CM

## 2023-11-18 DIAGNOSIS — S065XAA Traumatic subdural hemorrhage with loss of consciousness status unknown, initial encounter: Secondary | ICD-10-CM

## 2023-11-18 DIAGNOSIS — Z794 Long term (current) use of insulin: Secondary | ICD-10-CM

## 2023-11-18 DIAGNOSIS — I639 Cerebral infarction, unspecified: Secondary | ICD-10-CM

## 2023-11-18 MED ORDER — TRAZODONE HCL 50 MG PO TABS: 25.0000 mg | ORAL_TABLET | Freq: Every evening | ORAL | 3 refills | Status: DC | PRN

## 2023-11-18 MED ORDER — LEVETIRACETAM 100 MG/ML PO SOLN: 1000.0000 mg | Freq: Two times a day (BID) | ORAL | 5 refills | Status: DC

## 2023-11-18 MED ORDER — LANSOPRAZOLE 30 MG PO CPDR: 30.0000 mg | DELAYED_RELEASE_CAPSULE | Freq: Every day | ORAL | 3 refills | Status: DC

## 2023-11-18 NOTE — Progress Notes (Signed)
   Subjective:    [name], female    DOB: 11/02/1950, 73 y.o.   MRN: [id number]  HPI Here with her husband for a transitional care visit to follow up several recent hospital stays. She was admitted from 10-26-23 to 11-11-23 after several falls which caused a subdural hematoma, a subarachnoid hemorrhage, and a right temporal contusion. Scans revealed that she also had multiple small acute infarcts in both cerebral hemispheres. She had respiratory failure and was intubated for a while. She also had trouble taking food and liquids by mouth, so an NG tube was placed. She developed seizures, so she was started on Keppra . An IVC filter was placed since she was not a candidate for anticoagulants other than ASA. She had a rehab stay from 11-11-23 to 11-13-23 to get PT, OT, and ST. She then developed SVT and was hospitalized from 11-13-23 to 11-15-23. The SVT was controlled with Amiodarone  and Metoprolol . The NG tube was removed. She is scheduled to begin PT, ST, and OT next week. Since getting home she has had very little appetite, and she is eating very little food. Consequently she has stopped taking any insulin  since she got home. Her glucoses have averaged 140 to 220 in the past 24 hours. One of her main complaints today is trouble sleeping, and she says this is partly due to aches and pains all over her body at night. She takes Tramadol  with only partial relief.    Review of Systems  Constitutional:  Positive for appetite change and fatigue.  Respiratory: Negative.    Cardiovascular: Negative.   Gastrointestinal: Negative.   Genitourinary: Negative.   Musculoskeletal:  Positive for arthralgias and myalgias.  Psychiatric/Behavioral:  Positive for sleep disturbance.        Objective:   Physical Exam Constitutional:      Comments: She is frail, in a wheelchair   Cardiovascular:     Rate and Rhythm: Regular rhythm. Tachycardia present.     Pulses: Normal pulses.     Heart sounds: Normal  heart sounds.  Pulmonary:     Effort: Pulmonary effort is normal.     Breath sounds: Normal breath sounds.  Neurological:     Mental Status: She is alert and oriented to person, place, and time.           Assessment & Plan:  She is recovering from several intracranial bleeds and from recent strokes. She has developed a seizure disorder. She has been referred to Neurology to address these issues. She has not been taking the Keppra  pills because they are too bog to swallow, so I switched her to Keppra  oral suspension. She has been referred to Cardiology for the SVT. Her diabetes is not well controlled, so she will follow up with Donis Furnish NP at [facility]. She will get therapy at home as above. She can take Tramadol  for body aches, and she will try Trazodone  50 mg at bedtime for sleep. [name] she could take 2 of these at bedtime if needed. We spent a total of ( 35  ) minutes reviewing records and discussing these issues.  Miranda Diego, MD

## 2023-11-21 ENCOUNTER — Ambulatory Visit: Payer: Self-pay

## 2023-11-21 ENCOUNTER — Telehealth: Payer: Self-pay

## 2023-11-21 NOTE — Telephone Encounter (Signed)
 Copied from CRM [id number]. Topic: Clinical - Medication Question >> Nov 21, 2023 12:23 PM Miranda Cochran wrote: Reason for CRM: Patient is calling to see if she can be prescribed a pain medication for her body aches

## 2023-11-21 NOTE — Telephone Encounter (Signed)
 Duplicate message, original message was sent to PCP for advise

## 2023-11-21 NOTE — Telephone Encounter (Signed)
 Copied from CRM [id number]. Topic: Clinical - Red Word Triage >> Nov 21, 2023  3:50 PM Corin V wrote: [facility] that prompted transfer to Nurse Triage: Patient had a brain bleed about 8 weeks ago. She is having significant pain and body aches. She saw Dr. Alyne Babinski Friday 6/6 and he said to call in if she needed something. She is requesting Hydrocodone  5-325 that she can take once daily.  Reason for Disposition  Caller requesting a CONTROLLED substance prescription refill (e.g., narcotics, ADHD medicines)  Answer Assessment - Initial Assessment Questions 1. DRUG NAME: "What medicine do you need to have refilled?"     Hydrocodone /Acetaminophen  5-358 5. SYMPTOMS: "Do you have any symptoms?"     Body aches and pains which she spoke to Dr. Alyne Babinski about  Patient was told during her visit on 6/6 to call if she needs anything. Patient experiencing continued pain and would like a prescription sent to her pharmacy. Please advise.  Protocols used: Medication Refill and Renewal Call-A-AH  FYI Only or Action Required?: Action required by provider  Patient was last seen in primary care on 11/18/2023 by Donley Furth, MD. Called Nurse Triage reporting Medication Refill. Symptoms began since she was recently hospitalized. Interventions attempted: Prescription medications: Pain medications, requesting refill. Symptoms are: unchanged.  Triage Disposition: Call PCP When Office is Open  Patient/caregiver understands and will follow disposition?:

## 2023-11-22 MED ORDER — CELECOXIB 200 MG PO CAPS
200.0000 mg | ORAL_CAPSULE | Freq: Two times a day (BID) | ORAL | 5 refills | Status: DC
Start: 2023-11-22 — End: 2024-01-16

## 2023-11-22 NOTE — Telephone Encounter (Signed)
 I sent in Celebrex for her to take in addition to the Tramadol 

## 2023-11-22 NOTE — Telephone Encounter (Signed)
 Left a detailed message with the information below at the patient's cell number.

## 2023-11-22 NOTE — Addendum Note (Signed)
 Addended by: Corita Diego A on: 11/22/2023 07:48 AM   Modules accepted: Orders

## 2023-11-23 ENCOUNTER — Telehealth: Payer: Self-pay

## 2023-11-23 ENCOUNTER — Encounter: Payer: Self-pay | Admitting: Family Medicine

## 2023-11-23 DIAGNOSIS — G40909 Epilepsy, unspecified, not intractable, without status epilepticus: Secondary | ICD-10-CM | POA: Insufficient documentation

## 2023-11-23 DIAGNOSIS — I629 Nontraumatic intracranial hemorrhage, unspecified: Secondary | ICD-10-CM | POA: Insufficient documentation

## 2023-11-23 NOTE — Telephone Encounter (Signed)
 Copied from CRM [id number]. Topic: Clinical - Home Health Verbal Orders >> Nov 23, 2023  4:07 PM Bambi Bonine D wrote: Caller/Agency: Charisse with [facility] Callback Number: [phone] secure line Service Requested: Physical Therapy Frequency: 1 week 1,2 week 3, 1 week 1  Any new concerns about the patient? Yes, pt stated that she is experiencing pain in neck, shoulders, and also has a sinus headache. Pt stated that she has body aches and soreness and overall pain level is 8/10.

## 2023-11-23 NOTE — Telephone Encounter (Signed)
 Please okay these orders  ?

## 2023-11-24 NOTE — Telephone Encounter (Signed)
 Spoke with [place] with [facility] advised that VO have been approved, voiced understanding

## 2023-11-25 ENCOUNTER — Telehealth: Payer: Self-pay

## 2023-11-25 NOTE — Telephone Encounter (Signed)
 Copied from CRM [id number]. Topic: Clinical - Home Health Verbal Orders >> Nov 25, 2023 12:25 PM [place] A wrote: Caller/Agency: [facility] Callback Number: [phone] Service Requested: Occupational Therapy Frequency: Requesting to reschedule evaluation to next week due to not being able to make contact with patient  Any new concerns about the patient? No

## 2023-11-25 NOTE — Telephone Encounter (Signed)
 Spoke with [facility] advised of approved VO  for pt OT

## 2023-11-28 ENCOUNTER — Telehealth: Payer: Self-pay

## 2023-11-28 ENCOUNTER — Encounter: Payer: Self-pay | Admitting: Neurology

## 2023-11-28 NOTE — Telephone Encounter (Signed)
 Copied from CRM [id number]. Topic: Clinical - Medication Question >> Nov 28, 2023  2:10 PM [place] S wrote: Reason for CRM: Patient said the celecoxib  (CELEBREX ) 200 MG capsule is not doing any good for her pain. Also wants to know if Dereck Flavin has sent information for her short term disability. [employer] is requesting information on an office visit for April 10th appointment for over $3180. Callback number is [phone]

## 2023-11-29 MED ORDER — TRAMADOL HCL 50 MG PO TABS: 100.0000 mg | ORAL_TABLET | Freq: Four times a day (QID) | ORAL | 2 refills | Status: DC | PRN

## 2023-11-29 NOTE — Addendum Note (Signed)
 Addended by: Corita Diego A on: 11/29/2023 04:54 PM   Modules accepted: Orders

## 2023-11-29 NOTE — Telephone Encounter (Signed)
 For the pain, I sent in for some more Tramadol . As for the big bill, no one saw her on 09-22-23 . She was in the ED on 09-27-23 though

## 2023-12-02 ENCOUNTER — Ambulatory Visit (HOSPITAL_BASED_OUTPATIENT_CLINIC_OR_DEPARTMENT_OTHER)
Admission: RE | Admit: 2023-12-02 | Discharge: 2023-12-02 | Disposition: A | Discharge status: Home / Self Care | Source: Ambulatory Visit | Attending: Nurse Practitioner | Admitting: Nurse Practitioner

## 2023-12-02 DIAGNOSIS — K7469 Other cirrhosis of liver: Secondary | ICD-10-CM | POA: Insufficient documentation

## 2023-12-02 DIAGNOSIS — K754 Autoimmune hepatitis: Secondary | ICD-10-CM | POA: Diagnosis present

## 2023-12-12 ENCOUNTER — Telehealth: Payer: Self-pay

## 2023-12-12 NOTE — Telephone Encounter (Signed)
 Noted

## 2023-12-12 NOTE — Telephone Encounter (Signed)
 Copied from CRM [id number]. Topic: Clinical - Home Health Verbal Orders >> Dec 09, 2023  4:33 PM Rea BROCKS wrote: Caller/Agency: [facility] (Physical Therapy)  Callback Number: [phone] Any new concerns about the patient? Yes, wanted to make clinic aware that patient cancelled both visits this week-indicating that she is not ready for physical therapy. They are trying to encourage her to keep appointments because she needs it. They made two attempts and the patient refused both visits yesterday and today.

## 2023-12-13 ENCOUNTER — Telehealth: Payer: Self-pay | Admitting: Family Medicine

## 2023-12-13 ENCOUNTER — Other Ambulatory Visit: Payer: Self-pay | Admitting: Family Medicine

## 2023-12-13 NOTE — Telephone Encounter (Signed)
 FYI

## 2023-12-13 NOTE — Telephone Encounter (Signed)
 Copied from CRM [id number]. Topic: Clinical - Medical Advice >> Dec 13, 2023  2:31 PM Taleah C wrote: Reason for CRM: Harlene from [facility]  called to inform pcp that the patient has had several missed visits and told them she's been walking around and doesn't need physical therapy anymore. Callback number is [phone].

## 2023-12-13 NOTE — Progress Notes (Unsigned)
 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   Secondary stroke prevention as managed by PCP: ASA 81mg  daily LDL goal less than 70.  On Zetia .  Has statin intolerance.   Normotensive blood pressure - has follow up with PCP to evaluate after this visit. Hgb A1c goal less than 7 For seizure prophylaxis:  Keppra  500mg  twice daily Discussed seizure precautions, including [place] law stating no driving for 6 months I would like her to have one evaluation by PT/OT to make sure there are no further recommendations Follow up in 3 months.  Total time spent in chart and face to face with patient reviewing MRI scans, diagnosis and plan:  1 hour and 25 minutes.  Subjective:  Miranda Cochran is a 73 year old right-handed female with HTN, HLD, DM 2, autoimmune hepatitis with cirrhosis, GAVE syndrome, PAD, iron  deficiency anemia, depression, and anxiety who presents for cerebrovascular accident.  History supplemented by hospital records.     On 10/26/2023, she was standing in her kitchen eating when she suddenly felt a strange sensation in her head and she started seeing flashes in her vision, causing her to fall to her knees and then lost consciousness striking the back of her head on the floor.  She was admitted to [facility] where CT head demonstrated occipital skull fracture and right subdural hematoma with traumatic subarachnoid hemorrhage.  The next day, patient developed altered mental status and tachyarrhythmia.  Repeat CT head showed increasing  right subdural hematoma and follow up MRI of brain revealed small acute ischemic infarcts in the bilateral (right greater than left) cerebral hemispheres.  Patient intubated and EEG revealed seizure activity for which she received Keppra  and phenobarbital .  Serial head CTs demonstrated stability and improvement.  Two 2D echo showed EF 60-70% with moderate to severe LVH, moderate aortic valve stenosis and no atrial level shunt detected.  LDL 106 and Hgb A1c 6.8.  Discharged to inpatient rehab on 5/30 where overnight she had runs of SVT with hypotension.  She was readmitted to medicine.   Diagnosed with AVNRT which resolved with increasing metoprolol .  She was found to have right lower extremity DVT for which she received IVC filter but not candidate for anticoagulation given falls and TBI.  She was alert and moving all extremities but continued to demonstrate left sided weakness, expressive aphasia and dysphagia.  She remains on Keppra  and has not had any further seizures.    She has not gone to physical therapy but reports that her walking has greatly improved.  She sometimes has a cane with her just for support.  She has diffuse body aches that sometimes wake her up at night.  She denies any cognitive deficits.  She has an appointment with cardiology in September.  The previous month in April 2025, she had another fall in which she bent down to pet the dog and fell backwards, hitting the back of her head.  She did not lose consciousness at that time.    11/04/2023 MRI BRAIN WO:  Small areas of restricted diffusion with nodular and bandlike appearance clustered in the deep right cerebral white matter,  centered at the corona radiata, and in the left posterior frontal white matter. Some diffusion restriction along the surface of the posterior right temporal lobe which is likely contusion with susceptibility from subarachnoid blood products when compared with initial imaging. Seizure phenomenon is also  considered, there has been EEG monitoring based on prior head CT. Known subdural hematoma along the right more than left cerebral convexity, greatest at the anterior right frontal lobe measuring up to 6 mm thickness. Hemorrhagic contusion in the right anterior inferior frontotemporal region and low left cerebellum. Hygroma along the left frontal convexity up to 5 mm in thickness. 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 No hydrocephalus.  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   GERD (gastroesophageal reflux disease)    on meds   Hiatal hernia    Hx of adenomatous polyp of colon 07/07/2009   Hyperlipidemia    diet controlled   Hypertension    on meds - LASIX    IBS (irritable bowel syndrome)    Iron  deficiency anemia    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 Avram, adenimatous polyps, repeat in 5 yrs   ESOPHAGOGASTRODUODENOSCOPY  07/06/2021   per Dr. Avram, showed portal hypertensive gastropathy, repeat in 2 yrs   ESOPHAGOGASTRODUODENOSCOPY (EGD) WITH PROPOFOL  N/A 06/20/2017   Procedure: ESOPHAGOGASTRODUODENOSCOPY  (EGD) WITH PROPOFOL ;  Surgeon: Avram Lupita BRAVO, MD;  Location: [facility];  Service: Endoscopy;  Laterality: N/A;  with Barrx   EYE SURGERY     IR IVC FILTER PLMT / S&I /IMG GUID/MOD SED  10/31/2023   NASAL SEPTOPLASTY W/ TURBINOPLASTY     POLYPECTOMY  2015   CG-TA   TONSILLECTOMY      MEDICATIONS: Current Outpatient Medications on File Prior to Visit  Medication Sig Dispense Refill   amiodarone  (PACERONE ) 200 MG tablet Take 1 tablet (200 mg total) by mouth daily. 30 tablet 1   aspirin  81 MG chewable tablet Chew 1 tablet (81 mg total) by mouth daily. 90 tablet 1   celecoxib  (CELEBREX ) 200 MG capsule Take 1 capsule (200 mg total) by mouth 2 (two) times daily. 60 capsule 5   Cholecalciferol  25 MCG (1000 UT) CHEW Chew 1 tablet (1,000 Units total) by mouth daily.     Cyanocobalamin  500 MCG CHEW Chew 500 mcg by mouth daily.     ezetimibe  (ZETIA ) 10 MG tablet Place 1 tablet (10 mg total) into feeding tube daily. TAKE 1 TABLET(10 MG) BY MOUTH DAILY 30 tablet 1  insulin  aspart (NOVOLOG ) 100 UNIT/ML injection Inject 3 Units into the skin every 4 (four) hours. (Patient not taking: Reported on 11/18/2023)     insulin  aspart (NOVOLOG ) 100 UNIT/ML injection Inject 0-15 Units into the skin every 4 (four) hours. (Patient not taking: Reported on 11/18/2023)     insulin  glargine-yfgn (SEMGLEE ) 100 UNIT/ML injection Inject 0.15 mLs (15 Units total) into the skin daily. (Patient not taking: Reported on 11/18/2023)     lansoprazole  (PREVACID ) 30 MG capsule Take 1 capsule (30 mg total) by mouth daily at 12 noon. 90 capsule 3   levETIRAcetam  (KEPPRA ) 100 MG/ML solution Take 10 mLs (1,000 mg total) by mouth 2 (two) times daily. 473 mL 5   metoprolol  tartrate (LOPRESSOR ) 50 MG tablet Take 1 tablet (50 mg total) by mouth 2 (two) times daily. 120 tablet 1   Multiple Vitamin (MULTIVITAMIN PO) Take by mouth.     naloxone  (NARCAN ) nasal spray 4 mg/0.1 mL Spray the contents of 1 device into 1 nostril. Call 911. May repeat  with 2nd device in alternate nostril if no response in 2-3 minutes.     predniSONE  (DELTASONE ) 10 MG tablet Take 10 mg by mouth daily with breakfast.     traMADol  (ULTRAM ) 50 MG tablet Take 2 tablets (100 mg total) by mouth every 6 (six) hours as needed for moderate pain (pain score 4-6) or severe pain (pain score 7-10). 60 tablet 2   traZODone  (DESYREL ) 50 MG tablet Take 0.5-1 tablets (25-50 mg total) by mouth at bedtime as needed for sleep. 30 tablet 3   No current facility-administered medications on file prior to visit.    ALLERGIES: Allergies  Allergen Reactions   Fentanyl  Anxiety    Made her feel very anxious, requests not to get it.   Amoxicillin  Diarrhea   Augmentin  [Amoxicillin -Pot Clavulanate] Diarrhea   Statins Other (See Comments)    Myalgia   Farxiga  [Dapagliflozin ] Diarrhea and Other (See Comments)    GI pain    Levaquin [Levofloxacin] Anxiety    Weird dreams    FAMILY HISTORY: Family History  Problem Relation Age of Onset   Stroke Mother    Hypertension Mother    Colon cancer Father 25   Liver cancer Father 45   Colon polyps Father 11   Heart disease Sister        hole in heart   Pancreatic cancer Maternal Aunt    Brain cancer Maternal Uncle    Stroke Maternal Grandmother    Hypertension Maternal Grandmother    Esophageal cancer Maternal Grandfather    Throat cancer Maternal Grandfather    Breast cancer Paternal Grandmother    Brain cancer Paternal Grandmother    Lung cancer Paternal Grandfather    Rectal cancer Neg Hx    Stomach cancer Neg Hx     Objective:  Blood pressure (!) 163/100, pulse 96, height 5' 6 (1.676 m), weight 122 lb (55.3 kg), SpO2 98%. General: No acute distress.  Patient appears well-groomed.   Head:  Normocephalic/atraumatic Eyes:  fundi examined but not visualized Neck: supple, no paraspinal tenderness, full range of motion Back: No paraspinal tenderness Heart: regular rate and rhythm Lungs: Clear to auscultation  bilaterally. Vascular: No carotid bruits. Neurological Exam: Mental status: alert and oriented to person, place, and time, speech fluent and not dysarthric, language intact. Cranial nerves: CN I: not tested CN II: pupils equal, round and reactive to light, visual fields intact CN III, IV, VI:  full range of motion, no nystagmus, no  ptosis CN V: facial sensation intact. CN VII: upper and lower face symmetric CN VIII: hearing intact CN IX, X: gag intact, uvula midline CN XI: sternocleidomastoid and trapezius muscles intact CN XII: tongue midline Bulk & Tone: normal, no fasciculations. Motor:  muscle strength 5/5 throughout Sensation:  Pinprick, temperature and vibratory sensation intact. Deep Tendon Reflexes:  2+ throughout,  toes downgoing.   Finger to nose testing:  Without dysmetria.   Heel to shin:  Without dysmetria.   Gait:  Normal station and stride.  Romberg negative.    Thank you for allowing me to take part in the care of this patient.  Juliene Dunnings, DO  CC: Garnette Olmsted, MD

## 2023-12-14 ENCOUNTER — Encounter: Payer: Self-pay | Admitting: Neurology

## 2023-12-14 ENCOUNTER — Telehealth: Payer: Self-pay | Admitting: Neurology

## 2023-12-14 ENCOUNTER — Ambulatory Visit: Admitting: Neurology

## 2023-12-14 ENCOUNTER — Ambulatory Visit: Admitting: Family Medicine

## 2023-12-14 ENCOUNTER — Encounter: Payer: Self-pay | Admitting: Family Medicine

## 2023-12-14 VITALS — BP 163/100 | HR 96 | Ht 66.0 in | Wt 122.0 lb

## 2023-12-14 VITALS — BP 126/76 | HR 95 | Temp 98.3°F | Wt 121.6 lb

## 2023-12-14 DIAGNOSIS — G4700 Insomnia, unspecified: Secondary | ICD-10-CM

## 2023-12-14 DIAGNOSIS — S069X1A Unspecified intracranial injury with loss of consciousness of 30 minutes or less, initial encounter: Secondary | ICD-10-CM

## 2023-12-14 DIAGNOSIS — R569 Unspecified convulsions: Secondary | ICD-10-CM

## 2023-12-14 DIAGNOSIS — I10 Essential (primary) hypertension: Secondary | ICD-10-CM | POA: Diagnosis not present

## 2023-12-14 DIAGNOSIS — I639 Cerebral infarction, unspecified: Secondary | ICD-10-CM

## 2023-12-14 DIAGNOSIS — S065XAA Traumatic subdural hemorrhage with loss of consciousness status unknown, initial encounter: Secondary | ICD-10-CM

## 2023-12-14 DIAGNOSIS — G40909 Epilepsy, unspecified, not intractable, without status epilepticus: Secondary | ICD-10-CM

## 2023-12-14 DIAGNOSIS — I471 Supraventricular tachycardia, unspecified: Secondary | ICD-10-CM

## 2023-12-14 MED ORDER — LISINOPRIL 10 MG PO TABS: 10.0000 mg | ORAL_TABLET | Freq: Every day | ORAL | 3 refills | Status: AC

## 2023-12-14 MED ORDER — TEMAZEPAM 30 MG PO CAPS: 30.0000 mg | ORAL_CAPSULE | Freq: Every evening | ORAL | 5 refills | Status: DC | PRN

## 2023-12-14 NOTE — Patient Instructions (Signed)
 Refer to physical therapy  Continue aspirin  81mg  daily Continue Keppra  twice daily Check 30 day cardiac event monitor Will contact you if cardiology recommends other testing  6. If medication has been prescribed for you to prevent seizures, take it exactly as directed.  Do not stop taking the medicine without talking to your doctor first, even if you have not had a seizure in a long time.   7. Avoid activities in which a seizure would cause danger to yourself or to others.  Don't operate dangerous machinery, swim alone, or climb in high or dangerous places, such as on ladders, roofs, or girders.  Do not drive unless your doctor says you may.  8. If you have any warning that you may have a seizure, lay down in a safe place where you can't hurt yourself.    9.  No driving for 6 months from last seizure, as per [place]  state law.   Please refer to the following link on the Epilepsy Foundation of America's website for more information: http://www.epilepsyfoundation.org/answerplace/Social/driving/drivingu.cfm   10.  Maintain good sleep hygiene.  11.  Contact your doctor if you have any problems that may be related to the medicine you are taking.  12.  Call 911 and bring the patient back to the ED if:        A.  The seizure lasts longer than 5 minutes.       B.  The patient doesn't awaken shortly after the seizure  C.  The patient has new problems such as difficulty seeing, speaking or moving  D.  The patient was injured during the seizure  E.  The patient has a temperature over 102 F (39C)  F.  The patient vomited and now is having trouble breathing

## 2023-12-14 NOTE — Progress Notes (Signed)
   Subjective:    [name], female    DOB: 1951/01/16, 73 y.o.   MRN: [id number]  HPI Here with her husband Miranda Cochran to follow up on strokes and seizures resulting from head trauma. She feels better in general and she is getting a little stronger. She has stopped using a walker and she now uses a cane to walk. She saw Dr. Skeet, her neurologist, earlier today. He is concerned that the source of her strokes may have been cardiac, so he has ordered a 30 day event monitor for her to wear. Her husband has been staying with her while she recovers, and Dr. Skeet told her she cannot be by herself. She tried Trazodone  for sleep, but this did not help at all.    Review of Systems  Constitutional: Negative.   Respiratory: Negative.    Cardiovascular: Negative.   Neurological:  Positive for weakness. Negative for dizziness, tremors, seizures, syncope, facial asymmetry, speech difficulty and headaches.       Objective:   Physical Exam Constitutional:      Comments: Using a cane to walk   Cardiovascular:     Rate and Rhythm: Normal rate and regular rhythm.     Pulses: Normal pulses.     Heart sounds: Normal heart sounds.  Pulmonary:     Effort: Pulmonary effort is normal.     Breath sounds: Normal breath sounds.  Neurological:     Mental Status: She is alert and oriented to person, place, and time.           Assessment & Plan:  She is recovering from several strokes and intracranial bleeds from falls. These have generated some seizures, and she is taking Keppra  to prevent another seizure. I agree she is not able to be by herself at this point so we wrote a note for her husband to give to his job office to allow him to stay with her. For sleep, she will try Temazepam 30 mg at bedtime. Her HTN is stable We spent a total of ( 35  ) minutes reviewing records and discussing these issues.   Garnette Olmsted, MD

## 2023-12-14 NOTE — Telephone Encounter (Signed)
 Pt called stating Dr.Jaffe told her she was not to be left alone. She needs to know is that until her next appt in oct. Please call her back to discuss

## 2023-12-14 NOTE — Telephone Encounter (Signed)
 Does pt need to be back on this medication?

## 2023-12-14 NOTE — Telephone Encounter (Signed)
  The original prescription was discontinued on 11/11/2023 by Elgergawy, Brayton RAMAN, MD for the following reason: Stop Taking at Discharge. Renewing this prescription may not be appropriate

## 2023-12-15 ENCOUNTER — Telehealth: Payer: Self-pay

## 2023-12-15 ENCOUNTER — Telehealth: Payer: Self-pay | Admitting: Pharmacy Technician

## 2023-12-15 ENCOUNTER — Other Ambulatory Visit (HOSPITAL_COMMUNITY): Payer: Self-pay

## 2023-12-15 DIAGNOSIS — I6389 Other cerebral infarction: Secondary | ICD-10-CM

## 2023-12-15 DIAGNOSIS — I639 Cerebral infarction, unspecified: Secondary | ICD-10-CM

## 2023-12-15 NOTE — Telephone Encounter (Signed)
 PER Dr.Jaffe, I would like to order a limited echocardiogram with bubble study (stroke).  Patient advised of Order added.

## 2023-12-15 NOTE — Telephone Encounter (Signed)
 Pharmacy Patient Advocate Encounter   Received notification from Onbase that prior authorization for Temazepam 30MG  capsules is required/requested.   Insurance verification completed.   The patient is insured through [employer] .   Per test claim: PA required; PA submitted to above mentioned insurance via CoverMyMeds Key/confirmation #/EOC [id number] Status is pending     Insurance has a max allowable of 15 capsules every 30 days. Rx written for over the plan allowable.

## 2023-12-15 NOTE — Telephone Encounter (Signed)
 PA cigan 80% after Ded met $1000. Ded was met OOP max $3000/2555.85. NO PA NEEDED.    12/27/23 at 3 pm. Arrive at 2:30 pm at [facility]   LMOVM with appt time and date.

## 2023-12-15 NOTE — Telephone Encounter (Signed)
 Noted

## 2023-12-19 NOTE — Telephone Encounter (Signed)
 Patient advised of her appt at [facility]. Date not good for her. Tried give patient here number she wasn't felling well to write it down.     Also advised patient of Dr.Jaffe note.

## 2023-12-21 NOTE — Telephone Encounter (Signed)
 Patient aware of her appt for Echo.

## 2023-12-27 ENCOUNTER — Ambulatory Visit (HOSPITAL_COMMUNITY)
Admission: RE | Admit: 2023-12-27 | Discharge: 2023-12-27 | Disposition: A | Discharge status: Home / Self Care | Source: Ambulatory Visit | Attending: Internal Medicine | Admitting: Internal Medicine

## 2023-12-27 DIAGNOSIS — I639 Cerebral infarction, unspecified: Secondary | ICD-10-CM

## 2023-12-27 DIAGNOSIS — I340 Nonrheumatic mitral (valve) insufficiency: Secondary | ICD-10-CM

## 2023-12-27 DIAGNOSIS — I6389 Other cerebral infarction: Secondary | ICD-10-CM | POA: Diagnosis present

## 2023-12-27 LAB — ECHOCARDIOGRAM LIMITED BUBBLE STUDY
AR max vel: 1.14 cm2
AV Area VTI: 1.35 cm2
AV Area mean vel: 1.2 cm2
AV Mean grad: 12 mmHg
AV Peak grad: 22.8 mmHg
Ao pk vel: 2.39 m/s
Area-P 1/2: 3.85 cm2
S' Lateral: 2.4 cm

## 2023-12-28 ENCOUNTER — Ambulatory Visit: Payer: Self-pay | Admitting: Neurology

## 2023-12-29 ENCOUNTER — Telehealth: Payer: Self-pay

## 2023-12-29 NOTE — Telephone Encounter (Signed)
 Copied from CRM [id number]. Topic: Clinical - Home Health Verbal Orders >> Dec 29, 2023  4:38 PM [place] J wrote: Caller/AgencyBETHA [place] [facility] Callback Number: [phone] Service Requested: Physical Therapy Frequency: No frequency. Calling for orders to discharge since patient is not showing signs of communication. Any new concerns about the patient? No

## 2023-12-30 NOTE — Telephone Encounter (Signed)
 Please stop the PT

## 2023-12-30 NOTE — Telephone Encounter (Signed)
 Spoke with Charrisse advised of [name] response to stop Physical Therapy on pt

## 2024-01-07 ENCOUNTER — Other Ambulatory Visit: Payer: Self-pay | Admitting: Family Medicine

## 2024-01-13 ENCOUNTER — Other Ambulatory Visit: Payer: Self-pay | Admitting: Family Medicine

## 2024-01-14 ENCOUNTER — Other Ambulatory Visit: Payer: Self-pay | Admitting: Obstetrics and Gynecology

## 2024-01-14 DIAGNOSIS — E7800 Pure hypercholesterolemia, unspecified: Secondary | ICD-10-CM

## 2024-01-16 ENCOUNTER — Encounter: Payer: Self-pay | Admitting: Family Medicine

## 2024-01-16 ENCOUNTER — Ambulatory Visit: Admitting: Family Medicine

## 2024-01-16 VITALS — BP 142/82 | HR 94 | Temp 98.2°F | Wt 116.0 lb

## 2024-01-16 DIAGNOSIS — G40001 Localization-related (focal) (partial) idiopathic epilepsy and epileptic syndromes with seizures of localized onset, not intractable, with status epilepticus: Secondary | ICD-10-CM | POA: Diagnosis not present

## 2024-01-16 DIAGNOSIS — G40909 Epilepsy, unspecified, not intractable, without status epilepticus: Secondary | ICD-10-CM

## 2024-01-16 DIAGNOSIS — G4700 Insomnia, unspecified: Secondary | ICD-10-CM | POA: Diagnosis not present

## 2024-01-16 DIAGNOSIS — I639 Cerebral infarction, unspecified: Secondary | ICD-10-CM

## 2024-01-16 DIAGNOSIS — S065XAA Traumatic subdural hemorrhage with loss of consciousness status unknown, initial encounter: Secondary | ICD-10-CM | POA: Diagnosis not present

## 2024-01-16 DIAGNOSIS — S069X1A Unspecified intracranial injury with loss of consciousness of 30 minutes or less, initial encounter: Secondary | ICD-10-CM

## 2024-01-16 MED ORDER — METHOCARBAMOL 500 MG PO TABS: 500.0000 mg | ORAL_TABLET | Freq: Four times a day (QID) | ORAL | 5 refills | Status: DC | PRN

## 2024-01-16 NOTE — Progress Notes (Signed)
   Subjective:    [name], female    DOB: 14-Oct-1950, 73 y.o.   MRN: [id number]  HPI Here with her husband to follow up on seizures and a stroke from a closed head injury. She has done well since then with no further apparent seizures. She sees Dr. Skeet for neurologic care, and he ordered an ECHO with a bubble study to rule out cardiac sources of emboli. This was done on 12-27-23, and it showed normal LV function and no interatrial shunts. She has been improving as far as overall strength and balance goes. She can now get around unassisted without a cane. She mostly complains of some lower back pains now but no headaches. She tried taking Temazepam  for sleep, but she stopped this after a single dose because it made her heart race. She now takes Xanax  at bedtime, and this is working well. Also we had written her husband, Reeves, a letter to excuse him from work to assist her while she recovers. He is now almost ready to go back to work.    Review of Systems  Constitutional: Negative.   Respiratory: Negative.    Cardiovascular: Negative.   Musculoskeletal:  Positive for back pain.  Neurological: Negative.  Negative for dizziness, tremors, seizures, syncope, speech difficulty, weakness, light-headedness and headaches.       Objective:   Physical Exam Constitutional:      Appearance: Normal appearance.     Comments: She walks normally   Cardiovascular:     Rate and Rhythm: Normal rate and regular rhythm.     Pulses: Normal pulses.     Heart sounds: Normal heart sounds.  Pulmonary:     Effort: Pulmonary effort is normal.     Breath sounds: Normal breath sounds.  Neurological:     Mental Status: She is alert.           Assessment & Plan:  She is recovering nicely from issues related to a fall causing a head injury. She has no further neurologic deficits from the strokes, and she has had no recent signs of seizure activity. She will follow up with Dr. Skeet on  04-09-24. She is sleeping better with Xanax . I offered some PT for the low back pain, but she wants to see her chiropractor about this instead. We wrote a letter for her husband to return to his job on 01-30-24. We spent a total of ( 35  ) minutes reviewing records and discussing these issues.  Garnette Olmsted, MD  Garnette Olmsted, MD

## 2024-02-16 ENCOUNTER — Ambulatory Visit (INDEPENDENT_AMBULATORY_CARE_PROVIDER_SITE_OTHER): Admitting: Family Medicine

## 2024-02-16 ENCOUNTER — Encounter: Payer: Self-pay | Admitting: Family Medicine

## 2024-02-16 VITALS — BP 126/78 | HR 82 | Temp 98.3°F | Wt 117.0 lb

## 2024-02-16 DIAGNOSIS — R300 Dysuria: Secondary | ICD-10-CM

## 2024-02-16 DIAGNOSIS — N390 Urinary tract infection, site not specified: Secondary | ICD-10-CM | POA: Diagnosis not present

## 2024-02-16 LAB — POC URINALSYSI DIPSTICK (AUTOMATED)
Blood, UA: NEGATIVE
Glucose, UA: NEGATIVE
Ketones, UA: NEGATIVE
Nitrite, UA: NEGATIVE
Protein, UA: POSITIVE — AB
Spec Grav, UA: >=1.03 — AB (ref 1.010–1.025)
Urobilinogen, UA: 1 U/dL
pH, UA: 6 (ref 5.0–8.0)

## 2024-02-16 MED ORDER — SULFAMETHOXAZOLE-TRIMETHOPRIM 200-40 MG/5ML PO SUSP: 20.0000 mL | Freq: Two times a day (BID) | ORAL | 0 refills | Status: DC

## 2024-02-16 NOTE — Progress Notes (Signed)
   Subjective:    [name], female    DOB: Apr 08, 1951, 73 y.o.   MRN: [id number]  HPI Here with her husband for 3 days of burning on urination. No back pain or fever. She drinks plenty of water  every day.   Review of Systems  Constitutional: Negative.   Respiratory: Negative.    Cardiovascular: Negative.   Genitourinary:  Positive for dysuria. Negative for flank pain, hematuria and urgency.       Objective:   Physical Exam Constitutional:      Appearance: Normal appearance.  Cardiovascular:     Rate and Rhythm: Normal rate and regular rhythm.     Pulses: Normal pulses.     Heart sounds: Normal heart sounds.  Pulmonary:     Effort: Pulmonary effort is normal.     Breath sounds: Normal breath sounds.  Abdominal:     Tenderness: There is no right CVA tenderness or left CVA tenderness.  Neurological:     Mental Status: She is alert.           Assessment & Plan:  UTI, treat with 7 days of Bactrim . Culture the sample.  Garnette Olmsted, MD

## 2024-02-18 LAB — URINE CULTURE
MICRO NUMBER:: 16923188
SPECIMEN QUALITY:: ADEQUATE

## 2024-02-20 ENCOUNTER — Ambulatory Visit: Payer: Self-pay | Admitting: Family Medicine

## 2024-02-26 NOTE — Progress Notes (Unsigned)
 Cardiology Office Note:    Date:  02/28/2024   ID:  Miranda Cochran, DOB 22-Nov-1950, MRN [id number]  PCP:  Johnny Garnette LABOR, MD  Cardiologist:  None  Electrophysiologist:  None   Referring MD: Kandis Devaughn Sayres, MD   Chief Complaint  Patient presents with   Aortic Stenosis    History of Present Illness:    Miranda Cochran is a 73 y.o. female with a hx of CVA, T2DM, autoimmune hepatitis with cirrhosis, GAVE syndrome, hypertension, hyperlipidemia, PAD, iron  deficiency anemia who is referred by Dr.Wouk for evaluation of SVT.  On 10/26/2023 she was admitted after having loss of consciousness and hitting head on the floor.  CT head showed occipital skull fracture and right subdural hematoma with traumatic subarachnoid hemorrhage.  Brain MRI showed small acute infarcts bilaterally.  She developed seizures and was started on Keppra  and phenobarbital .  Echocardiogram showed EF 60 to 65%, moderate to severe LVH, moderate aortic stenosis.  She had SVT which was treated with adenosine .  She was discharged on 5/30 to inpatient rehab, where she had runs of SVT with hypotension.  Subsequently admitted back to medicine service, her SVT was treated with metoprolol  and amiodarone .  She also found to have right lower extremity DVT, for which IVC filter was placed as not a candidate for anticoagulation.  Given DVT and her bilateral CVA, there was concern for shunt and bubble study was done 12/2023, which showed no evidence of interatrial shunt.  Since discharge from hospital, she reports she is doing well.  Denies any chest pain, dyspnea, lightheadedness, syncope, lower extremity edema, or palpitations.    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   GERD (gastroesophageal reflux disease)    on meds   Hiatal hernia    Hx of adenomatous polyp of colon 07/07/2009   Hyperlipidemia    diet controlled   Hypertension    on meds - LASIX    IBS (irritable bowel syndrome)    Iron  deficiency anemia    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 Avram, adenimatous polyps, repeat in 5 yrs   ESOPHAGOGASTRODUODENOSCOPY  07/06/2021   per Dr. Avram, showed portal hypertensive gastropathy, repeat in 2 yrs   ESOPHAGOGASTRODUODENOSCOPY (EGD) WITH PROPOFOL  N/A 06/20/2017   Procedure: ESOPHAGOGASTRODUODENOSCOPY (EGD) WITH PROPOFOL ;  Surgeon: Avram Lupita BRAVO, MD;  Location: [facility];  Service: Endoscopy;  Laterality: N/A;  with Barrx   EYE SURGERY     IR IVC FILTER PLMT / S&I /IMG GUID/MOD SED  10/31/2023   NASAL SEPTOPLASTY W/  TURBINOPLASTY     POLYPECTOMY  2015   CG-TA   TONSILLECTOMY      Current Medications: Current Meds  Medication Sig   ALPRAZolam  (XANAX ) 0.5 MG tablet TAKE 1 TABLET(0.5 MG) BY MOUTH AT BEDTIME AS NEEDED FOR SLEEP   aspirin  81 MG chewable tablet Chew 1 tablet (81 mg total) by mouth daily.   Cholecalciferol  25 MCG (1000 UT) CHEW Chew 1 tablet (1,000 Units total) by mouth daily.   Cyanocobalamin  500 MCG CHEW Chew 500 mcg by mouth daily.   ezetimibe  (ZETIA ) 10 MG tablet Place 1 tablet (10 mg total) into feeding tube daily. TAKE 1 TABLET(10 MG) BY MOUTH DAILY   lansoprazole  (PREVACID ) 30 MG capsule Take  1 capsule (30 mg total) by mouth daily at 12 noon.   levETIRAcetam  (KEPPRA ) 100 MG/ML solution Take 10 mLs (1,000 mg total) by mouth 2 (two) times daily.   lisinopril  (ZESTRIL ) 10 MG tablet Take 1 tablet (10 mg total) by mouth daily.   methocarbamol  (ROBAXIN ) 500 MG tablet Take 1 tablet (500 mg total) by mouth every 6 (six) hours as needed for muscle spasms.   milk thistle 175 MG tablet Take 175 mg by mouth daily.   Multiple Vitamin (MULTIVITAMIN PO) Take by mouth.   naloxone  (NARCAN ) nasal spray 4 mg/0.1 mL Spray the contents of 1 device into 1 nostril. Call 911. May repeat with 2nd device in alternate nostril if no response in 2-3 minutes.   predniSONE  (DELTASONE ) 10 MG tablet Take 10 mg by mouth daily with breakfast.   sulfamethoxazole -trimethoprim  (BACTRIM ) 200-40 MG/5ML suspension Take 20 mLs by mouth 2 (two) times daily.   Current Facility-Administered Medications for the 02/27/24 encounter (Office Visit) with Kate Lonni CROME, MD  Medication   [START ON 03/26/2024] denosumab  (PROLIA ) injection 60 mg     Allergies:   Fentanyl , Amoxicillin , Augmentin  [amoxicillin -pot clavulanate], Statins, Farxiga  [dapagliflozin ], and Levaquin [levofloxacin]   Social History   Socioeconomic History   Marital status: Married    Spouse name: Not on file   Number of children: 0   Years of education: Not on file   Highest education level: Associate degree: occupational, Scientist, product/process development, or vocational program  Occupational History   Occupation: Therapist, nutritional    Comment: [employer]  Tobacco Use   Smoking status: Never   Smokeless tobacco: Never  Vaping Use   Vaping status: Never Used  Substance and Sexual Activity   Alcohol use: Yes    Comment: champagne on weekends   Drug use: No   Sexual activity: Yes    Partners: Male    Birth control/protection: Post-menopausal  Other Topics Concern   Not on file  Social History Narrative   Divorced, no children   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 : Never true  Transportation Needs: No Transportation Needs (02/21/2024)   Received from [facility]    In the  past 12 months, has lack of reliable transportation kept you from medical appointments, meetings, work or from getting things needed for daily living? : No  Physical Activity: Insufficiently Active (04/21/2023)   Exercise Vital Sign    Days of Exercise per Week: 4 days    Minutes of Exercise per Session: 20 min  Stress: Stress Concern Present (04/21/2023)   [employer] of Occupational Health - Occupational Stress Questionnaire    Feeling of Stress : To some extent  Social Connections: Socially Isolated (11/13/2023)   Social Connection and Isolation Panel    Frequency of Communication with Friends and Family: Once a week    Frequency of Social Gatherings with Friends and Family: Once a week    Attends Religious Services: Never    Database administrator or Organizations: No    Attends Engineer, structural: Never    Marital Status: Married     Family History: The patient's family history includes Brain cancer in her maternal uncle and paternal grandmother; Breast cancer in her paternal grandmother; Colon cancer (age of onset: 58) in her father; Colon polyps (age of onset: 86) in her father; Esophageal cancer in her maternal grandfather; Heart disease in her sister; Hypertension in her maternal grandmother and mother; Liver  cancer (age of onset: 65) in her father; Lung cancer in her paternal grandfather; Pancreatic cancer in her maternal aunt; Stroke in her maternal grandmother and mother; Throat cancer in her maternal grandfather. There is no history of Rectal cancer or Stomach cancer.  ROS:   Please see the history of present illness.     All other systems reviewed and are negative.  EKGs/Labs/Other Studies Reviewed:    The following studies were reviewed today:   EKG:   02/27/2024: Sinus rhythm with PVCs, nonspecific T wave flattening, rate 83  Recent Labs: 11/07/2023: TSH 4.013 11/11/2023: B Natriuretic Peptide 62.0; Magnesium  2.2 11/12/2023: ALT 45 11/13/2023: BUN 20; Creatinine, Ser 0.52; Hemoglobin 11.2; Platelets 344; Potassium 4.1; Sodium 135  Recent Lipid Panel    Component Value Date/Time   CHOL 182 11/05/2023 0603   TRIG 119 11/05/2023 0603   HDL 39 (L) 11/05/2023 0603   CHOLHDL 4.7 11/05/2023 0603   VLDL 24 11/05/2023 0603   LDLCALC 119 (H) 11/05/2023 0603   LDLDIRECT 133.0 09/15/2021 1135    Physical Exam:    VS:  BP 118/76   Pulse 86   Ht 5' 6 (1.676 m)   Wt 113 lb 6.4 oz (51.4 kg)   SpO2 96%   BMI 18.30 kg/m     Wt Readings from Last 3 Encounters:  02/27/24 113 lb 6.4 oz (51.4 kg)  02/16/24 117 lb (53.1 kg)  01/16/24 116 lb (52.6 kg)     GEN:  Well nourished, well developed in no acute distress HEENT: Normal NECK: No JVD; No carotid bruits LYMPHATICS: No lymphadenopathy CARDIAC: RRR, 3 out of 6 systolic murmur RESPIRATORY:  Clear to auscultation without rales, wheezing or rhonchi  ABDOMEN: Soft, non-tender, non-distended MUSCULOSKELETAL:  No edema; No deformity  SKIN: Warm and dry NEUROLOGIC:  Alert and oriented x 3 PSYCHIATRIC:  Normal affect   ASSESSMENT:    1. Aortic valve stenosis, etiology of cardiac valve disease unspecified   2. SVT (supraventricular tachycardia) (HCC)   3. Cerebrovascular accident (CVA), unspecified mechanism (HCC)   4. Presence of  inferior vena cava filter   5. Deep vein thrombosis (DVT) of distal vein of lower extremity, unspecified chronicity, unspecified laterality (HCC)  PLAN:    Moderate to severe aortic stenosis: Noted on echocardiogram 10/2023.  Repeat echocardiogram in 6 months to monitor (04/2024)  SVT: Noted during admission following CVA 10/2023, required adenosine  to treat.  She had recurrent SVT and was started on metoprolol  and amiodarone . - She stopped taking metoprolol  and amiodarone  when she was discharged.  She does not report any evidence of recurrence.  She was given cardiac monitor x 1 month by neurology but has not worn.  Encouraged her to wear monitor  CVA: On 10/26/2023 she was admitted after having loss of consciousness and hitting head on the floor.  CT head showed occipital skull fracture and right subdural hematoma with traumatic subarachnoid hemorrhage.  Brain MRI showed small acute infarcts bilaterally.  Echocardiogram showed EF 60 to 65%, moderate LVH, moderate to severe aortic stenosis. She also found to have right lower extremity DVT, for which IVC filter was placed as not a candidate for anticoagulation.  Given DVT and her bilateral CVA, there was concern for shunt and bubble study was done 12/2023, which showed no evidence of interatrial shunt. - Follows with neurology, on aspirin  and Zetia  - 30-day monitor recommended.  She has monitor at home but has not worn it, encouraged her to wear monitor  DVT: Status post IVC filter as above.  She was supposed to have follow-up with IR but does not appear this has been done.  Will refer to IR   Hypertension: On lisinopril  10 mg daily.  Appears controlled  Hyperlipidemia: On Zetia  10 mg daily.  Has statin intolerance  RTC in 3 months  Medication Adjustments/Labs and Tests Ordered: Current medicines are reviewed at length with the patient today.  Concerns regarding medicines are outlined above.  Orders Placed This Encounter  Procedures    Ambulatory referral to Interventional Radiology   EKG 12-Lead   ECHOCARDIOGRAM COMPLETE   No orders of the defined types were placed in this encounter.   Patient Instructions  Medication Instructions:  Your physician recommends that you continue on your current medications as directed. Please refer to the Current Medication list given to you today.  *If you need a refill on your cardiac medications before your next appointment, please call your pharmacy*  Lab Work: none If you have labs (blood work) drawn today and your tests are completely normal, you will receive your results only by: MyChart Message (if you have MyChart) OR A paper copy in the mail If you have any lab test that is abnormal or we need to change your treatment, we will call you to review the results.  Testing/Procedures: Echo  Your physician has requested that you have an echocardiogram. Echocardiography is a painless test that uses sound waves to create images of your heart. It provides your doctor with information about the size and shape of your heart and how well your heart's chambers and valves are working. This procedure takes approximately one hour. There are no restrictions for this procedure. Please do NOT wear cologne, perfume, aftershave, or lotions (deodorant is allowed). Please arrive 15 minutes prior to your appointment time.  Please note: We ask at that you not bring children with you during ultrasound (echo/ vascular) testing. Due to room size and safety concerns, children are not allowed in the ultrasound rooms during exams. Our front office staff cannot provide observation of children in our lobby area while testing is being conducted. An adult accompanying a patient to their appointment will only be allowed in the ultrasound room at the  discretion of the ultrasound technician under special circumstances. We apologize for any inconvenience.   Follow-Up: At [facility], you and your health  needs are our priority.  As part of our continuing mission to provide you with exceptional heart care, our providers are all part of one team.  This team includes your primary Cardiologist (physician) and Advanced Practice Providers or APPs (Physician Assistants and Nurse Practitioners) who all work together to provide you with the care you need, when you need it.  Your next appointment:   3 months  Provider:   Dr. Kate We recommend signing up for the patient portal called MyChart.  Sign up information is provided on this After Visit Summary.  MyChart is used to connect with patients for Virtual Visits (Telemedicine).  Patients are able to view lab/test results, encounter notes, upcoming appointments, etc.  Non-urgent messages can be sent to your provider as well.   To learn more about what you can do with MyChart, go to [email or web address].   Other Instructions  Please follow up with neurologist for monitor and please wear monitor Please follow up with IVF  Referral for Interventional Radiology          Signed, Lonni LITTIE Kate, MD  02/28/2024 10:32 AM    [facility]

## 2024-02-27 ENCOUNTER — Other Ambulatory Visit: Payer: Self-pay

## 2024-02-27 ENCOUNTER — Ambulatory Visit: Attending: Cardiology | Admitting: Cardiology

## 2024-02-27 ENCOUNTER — Encounter: Payer: Self-pay | Admitting: Cardiology

## 2024-02-27 VITALS — BP 118/76 | HR 86 | Ht 66.0 in | Wt 113.4 lb

## 2024-02-27 DIAGNOSIS — I471 Supraventricular tachycardia, unspecified: Secondary | ICD-10-CM

## 2024-02-27 DIAGNOSIS — I350 Nonrheumatic aortic (valve) stenosis: Secondary | ICD-10-CM | POA: Diagnosis not present

## 2024-02-27 DIAGNOSIS — Z95828 Presence of other vascular implants and grafts: Secondary | ICD-10-CM

## 2024-02-27 DIAGNOSIS — I639 Cerebral infarction, unspecified: Secondary | ICD-10-CM | POA: Diagnosis not present

## 2024-02-27 DIAGNOSIS — I824Z9 Acute embolism and thrombosis of unspecified deep veins of unspecified distal lower extremity: Secondary | ICD-10-CM

## 2024-02-27 DIAGNOSIS — M810 Age-related osteoporosis without current pathological fracture: Secondary | ICD-10-CM

## 2024-02-27 MED ORDER — DENOSUMAB 60 MG/ML ~~LOC~~ SOSY
60.0000 mg | PREFILLED_SYRINGE | SUBCUTANEOUS | Status: AC
  Administered 2024-03-27: 60 mg via SUBCUTANEOUS

## 2024-02-27 NOTE — Patient Instructions (Addendum)
 Medication Instructions:  Your physician recommends that you continue on your current medications as directed. Please refer to the Current Medication list given to you today.  *If you need a refill on your cardiac medications before your next appointment, please call your pharmacy*  Lab Work: none If you have labs (blood work) drawn today and your tests are completely normal, you will receive your results only by: MyChart Message (if you have MyChart) OR A paper copy in the mail If you have any lab test that is abnormal or we need to change your treatment, we will call you to review the results.  Testing/Procedures: Echo  Your physician has requested that you have an echocardiogram. Echocardiography is a painless test that uses sound waves to create images of your heart. It provides your doctor with information about the size and shape of your heart and how well your heart's chambers and valves are working. This procedure takes approximately one hour. There are no restrictions for this procedure. Please do NOT wear cologne, perfume, aftershave, or lotions (deodorant is allowed). Please arrive 15 minutes prior to your appointment time.  Please note: We ask at that you not bring children with you during ultrasound (echo/ vascular) testing. Due to room size and safety concerns, children are not allowed in the ultrasound rooms during exams. Our front office staff cannot provide observation of children in our lobby area while testing is being conducted. An adult accompanying a patient to their appointment will only be allowed in the ultrasound room at the discretion of the ultrasound technician under special circumstances. We apologize for any inconvenience.   Follow-Up: At [facility], you and your health needs are our priority.  As part of our continuing mission to provide you with exceptional heart care, our providers are all part of one team.  This team includes your primary  Cardiologist (physician) and Advanced Practice Providers or APPs (Physician Assistants and Nurse Practitioners) who all work together to provide you with the care you need, when you need it.  Your next appointment:   3 months  Provider:   Dr. Kate We recommend signing up for the patient portal called MyChart.  Sign up information is provided on this After Visit Summary.  MyChart is used to connect with patients for Virtual Visits (Telemedicine).  Patients are able to view lab/test results, encounter notes, upcoming appointments, etc.  Non-urgent messages can be sent to your provider as well.   To learn more about what you can do with MyChart, go to [email or web address].   Other Instructions  Please follow up with neurologist for monitor and please wear monitor Please follow up with IVF  Referral for Interventional Radiology

## 2024-02-27 NOTE — Progress Notes (Signed)
 Pt on Bone Density List. Order placed for PA.

## 2024-02-28 ENCOUNTER — Telehealth: Payer: Self-pay

## 2024-02-28 ENCOUNTER — Other Ambulatory Visit (HOSPITAL_COMMUNITY): Payer: Self-pay

## 2024-02-28 NOTE — Telephone Encounter (Signed)
 Prolia  VOB initiated via [email or web address]  Next Prolia  inj DUE: 03/23/24

## 2024-02-29 ENCOUNTER — Ambulatory Visit: Payer: Self-pay | Admitting: *Deleted

## 2024-02-29 NOTE — Telephone Encounter (Signed)
 Patient going out of town Saturday . See NT encounter    FYI Only or Action Required?: Action required by provider: requesting 10-12 tablets hydrocodone  5-325 mg / TB .  Patient was last seen in primary care on 02/16/2024 by Johnny Garnette LABOR, MD.  Called Nurse Triage reporting Back Pain.  Symptoms began since May.  Interventions attempted: Prescription medications: methocarbamol .  Symptoms are: unchanged.  Triage Disposition: See PCP When Office is Open (Within 3 Days)  Patient/caregiver understands and will follow disposition?: No, wishes to speak with PCP           Copied from CRM #[id number]. Topic: Clinical - Red Word Triage >> Feb 29, 2024 12:44 PM Dedra B wrote: Red Word that prompted transfer to Nurse Triage: Pt is calling in with worsening back pain. She was previously prescribed methocarbamol  but it is not working. Warm transfer to nurse triage. Reason for Disposition  [1] MODERATE back pain (e.g., interferes with normal activities) AND [2] present > 3 days  Answer Assessment - Initial Assessment Questions Offered appt, declined.  Patient reports she just saw PCP 02/16/24 and PCP aware of back pain issues. Patient requesting medication hydrocodone  5-325 mg / TB. Patient requesting 10-12 tablets to be able to tolerate up coming trip out of town / cruise. Difficulty performing house hold activities and feels she will not be able to tolerate activities on her trip without pain medications. Concerned methocarbamol  will cause too much drowsiness. Please advise and call patient back per request. Please send Rx to [facility] [address]   Patient will be leaving for out of town on Saturday .      1. ONSET: When did the pain begin? (e.g., minutes, hours, days)     When had fall during having stroke May 14th  2. LOCATION: Where does it hurt? (upper, mid or lower back)     Lumbar region  3. SEVERITY: How bad is the pain?  (e.g., Scale 1-10; mild, moderate, or severe)      Moderate to severe with mobility.  Difficulty washing clothes or doing  4. PATTERN: Is the pain constant? (e.g., yes, no; constant, intermittent)      Comes and goes with mobility  5. RADIATION: Does the pain shoot into your legs or somewhere else?     na 6. CAUSE:  What do you think is causing the back pain?      Previous fall 7. BACK OVERUSE:  Any recent lifting of heavy objects, strenuous work or exercise?     No  8. MEDICINES: What have you taken so far for the pain? (e.g., nothing, acetaminophen , NSAIDS)     methocarbamol  9. NEUROLOGIC SYMPTOMS: Do you have any weakness, numbness, or problems with bowel/bladder control?     Na  10. OTHER SYMPTOMS: Do you have any other symptoms? (e.g., fever, abdomen pain, burning with urination, blood in urine)       Back pian 11. PREGNANCY: Is there any chance you are pregnant? When was your last menstrual period?       na  Protocols used: Back Pain-A-AH

## 2024-03-01 ENCOUNTER — Other Ambulatory Visit (HOSPITAL_COMMUNITY): Payer: Self-pay

## 2024-03-01 ENCOUNTER — Other Ambulatory Visit: Payer: Self-pay | Admitting: Family Medicine

## 2024-03-01 MED ORDER — HYDROCODONE-ACETAMINOPHEN 5-325 MG PO TABS: 1.0000 | ORAL_TABLET | Freq: Four times a day (QID) | ORAL | 0 refills | Status: DC | PRN

## 2024-03-01 NOTE — Progress Notes (Signed)
 Done

## 2024-03-01 NOTE — Telephone Encounter (Signed)
I sent in #30  

## 2024-03-01 NOTE — Telephone Encounter (Signed)
 BUY AND BILL PA SUBMITTED VIA [employer]. Prior Auth (EOC) ID: [id number]

## 2024-03-01 NOTE — Telephone Encounter (Signed)
 PHARMACY PA

## 2024-03-01 NOTE — Telephone Encounter (Signed)
 SABRA

## 2024-03-02 ENCOUNTER — Other Ambulatory Visit (HOSPITAL_COMMUNITY): Payer: Self-pay

## 2024-03-02 NOTE — Telephone Encounter (Signed)
Spoke with pt voiced understanding.

## 2024-03-02 NOTE — Telephone Encounter (Signed)
 PA for buy and bill submitted via fax. PA form in media

## 2024-03-08 ENCOUNTER — Other Ambulatory Visit (HOSPITAL_COMMUNITY): Payer: Self-pay

## 2024-03-08 NOTE — Telephone Encounter (Signed)
 Called ins to follow up on PA request. PA is still pending review.  Ref#: [id number]

## 2024-03-09 ENCOUNTER — Other Ambulatory Visit (HOSPITAL_COMMUNITY): Payer: Self-pay

## 2024-03-09 NOTE — Telephone Encounter (Signed)
 BUY AND BILL APPROVED  AUTH #: [id number]

## 2024-03-09 NOTE — Telephone Encounter (Signed)
 Pt ready for scheduling for PROLIA  on or after : 03/23/24  Option# 1: Buy/Bill (Office supplied medication)  Out-of-pocket cost due at time of clinic visit: $0  Number of injection/visits approved: 2  Primary: ALLEGIANCE ([employer] Prolia  co-insurance: 0% Admin fee co-insurance: 0%  Secondary: --- Prolia  co-insurance:  Admin fee co-insurance:   Medical Benefit Details: Date Benefits were checked: 02/29/24 Deductible: $1000 Met of $1000 Required/ Coinsurance: 0%/ Admin Fee: 0%  Prior Auth: APPROVED PA# [id number]  Expiration Date: 01/15/24-01/14/25  # of doses approved: 2 ----------------------------------------------------------------------- Option# 2- Med Obtained from pharmacy: -MUST FILL THROUGH [facility]   Pharmacy benefit: Copay $--- (Paid to pharmacy) Admin Fee: 0% (Pay at clinic)  Prior Auth: APPROVED PA# [id number]  Expiration Date: 08/08/23-09/06/24  # of doses approved: 2   If patient wants fill through the pharmacy benefit please send prescription to: [facility], and include estimated need by date in rx notes. Pharmacy will ship medication directly to the office.  Patient IS eligible for Prolia  Copay Card. Copay Card can make patient's cost as little as $25. Link to apply: [email or web address]://[email or web address]  ** This summary of benefits is an estimation of the patient's out-of-pocket cost. Exact cost may very based on individual plan coverage.

## 2024-03-16 ENCOUNTER — Ambulatory Visit: Admitting: Family Medicine

## 2024-03-21 ENCOUNTER — Ambulatory Visit: Admitting: Family Medicine

## 2024-03-26 ENCOUNTER — Ambulatory Visit

## 2024-03-26 ENCOUNTER — Ambulatory Visit: Admitting: Family Medicine

## 2024-03-27 ENCOUNTER — Ambulatory Visit

## 2024-03-27 ENCOUNTER — Ambulatory Visit (INDEPENDENT_AMBULATORY_CARE_PROVIDER_SITE_OTHER): Admitting: Family Medicine

## 2024-03-27 ENCOUNTER — Encounter: Payer: Self-pay | Admitting: Family Medicine

## 2024-03-27 VITALS — BP 118/62 | HR 88 | Temp 98.2°F | Wt 116.8 lb

## 2024-03-27 DIAGNOSIS — G40909 Epilepsy, unspecified, not intractable, without status epilepticus: Secondary | ICD-10-CM

## 2024-03-27 DIAGNOSIS — M810 Age-related osteoporosis without current pathological fracture: Secondary | ICD-10-CM

## 2024-03-27 DIAGNOSIS — S069X1A Unspecified intracranial injury with loss of consciousness of 30 minutes or less, initial encounter: Secondary | ICD-10-CM

## 2024-03-27 DIAGNOSIS — S065XAA Traumatic subdural hemorrhage with loss of consciousness status unknown, initial encounter: Secondary | ICD-10-CM

## 2024-03-27 DIAGNOSIS — I350 Nonrheumatic aortic (valve) stenosis: Secondary | ICD-10-CM

## 2024-03-27 MED ORDER — DENOSUMAB 60 MG/ML ~~LOC~~ SOSY: 60.0000 mg | PREFILLED_SYRINGE | SUBCUTANEOUS | Status: AC

## 2024-03-27 NOTE — Progress Notes (Signed)
   Subjective:    [name], female    DOB: 1950/09/05, 73 y.o.   MRN: [id number]  HPI Here with her husband to follow up a stroke and seizures 4 months ago which resulted from head trama. She has done very well since then. She has no neurologic complaints now at all, no headaches, etc. No chest pain or SOB. Her last ECHO showed a moderately stenosed AV, and she is scheduled for another ECHO on 04-06-24 to see if she will need surgery or not. She does have some back spasms off and on, but she used Methocarbamol  as needed. She is seeing a chiropractor twice a week. We treated her for a UTI last month, and now she denies any urinary issues.    Review of Systems  Constitutional: Negative.   Respiratory: Negative.    Cardiovascular: Negative.   Musculoskeletal:  Positive for back pain.  Neurological: Negative.        Objective:   Physical Exam Constitutional:      Appearance: Normal appearance.     Comments: Walks unassisted   Cardiovascular:     Rate and Rhythm: Normal rate and regular rhythm.     Pulses: Normal pulses.     Comments: 3/6 SM  Pulmonary:     Effort: Pulmonary effort is normal.     Breath sounds: Normal breath sounds.  Chest:     Chest wall: No tenderness.  Neurological:     Mental Status: She is alert and oriented to person, place, and time. Mental status is at baseline.           Assessment & Plan:  She is doing well after a stroke and a subdural hematoma. She will follow up with Dr. Skeet for a neurologic visit on 04-09-24, and he will determine when she can stop the Keppra . She will follow up with Cardiology as above.  Garnette Olmsted, MD

## 2024-03-27 NOTE — Addendum Note (Signed)
 Addended by: LADONNA INOCENTE SAILOR on: 03/27/2024 03:17 PM   Modules accepted: Orders

## 2024-04-06 ENCOUNTER — Ambulatory Visit (HOSPITAL_COMMUNITY)
Admission: RE | Admit: 2024-04-06 | Discharge: 2024-04-06 | Disposition: A | Discharge status: Home / Self Care | Source: Ambulatory Visit | Attending: Cardiology | Admitting: Cardiology

## 2024-04-06 ENCOUNTER — Telehealth: Payer: Self-pay | Admitting: Neurology

## 2024-04-06 DIAGNOSIS — I350 Nonrheumatic aortic (valve) stenosis: Secondary | ICD-10-CM | POA: Insufficient documentation

## 2024-04-06 LAB — ECHOCARDIOGRAM COMPLETE
AR max vel: 0.92 cm2
AV Area VTI: 0.95 cm2
AV Area mean vel: 0.93 cm2
AV Mean grad: 28.3 mmHg
AV Peak grad: 46.4 mmHg
Ao pk vel: 3.41 m/s
Area-P 1/2: 2.39 cm2
S' Lateral: 1.94 cm

## 2024-04-06 NOTE — Progress Notes (Deleted)
 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   Secondary stroke prevention as managed by PCP: ASA 81mg  daily LDL goal less than 70.  On Zetia .  Has statin intolerance.   Normotensive blood pressure - has follow up with PCP to evaluate after this visit. Hgb A1c goal less than 7 For seizure prophylaxis:  Keppra  500mg  twice daily Discussed seizure precautions, including  law stating no driving for 6 months Follow up in 3 months.  ***  Subjective:  KIMYETTA FLOTT is a 73 year old right-handed female with HTN, HLD, DM 2, aortic valve stenosis, autoimmune hepatitis with cirrhosis, GAVE syndrome, PAD, iron  deficiency anemia, depression, and anxiety who presents follows up for stroke, traumatic SDH and symptomatic seizure.  UPDATE: Current medications:  ASA 81mg , Zetia , lisinopril , Keppra  100mg /ml 10 mL (1000mg ) twice daily  As she was found to have a right lower extremity DVT in setting of a stroke that is suspicious for cardioembolic etiology, she had a limited echo with bubble study performed on 7/15 which was negative.  She followed up with Dr. Laurine of cardiology last month.  She was set up with a one month cardiac event monitor.  ***  She sometimes has a cane with her just for support.  She has diffuse body aches that sometimes wake her up at night.  She denies any cognitive deficits.   No recurrent seizures.  HISTORY: On 10/26/2023, she was standing in her kitchen eating when she suddenly felt a strange sensation in her head and she started seeing flashes in her vision, causing her to fall to her knees and then lost consciousness striking the back of her  head on the floor.  She was admitted to [facility] where CT head demonstrated occipital skull fracture and right subdural hematoma with traumatic subarachnoid hemorrhage.  The next day, patient developed altered mental status and tachyarrhythmia.  Repeat CT head showed increasing right subdural hematoma and follow up MRI of brain revealed small acute ischemic infarcts in the bilateral (right greater than left) cerebral hemispheres.  Patient intubated and EEG revealed seizure activity for which she received Keppra  and phenobarbital .  Serial head CTs demonstrated stability and improvement.  Two 2D echo showed EF 60-70% with moderate to severe LVH, moderate aortic valve stenosis and no atrial level shunt detected.  LDL 106 and Hgb A1c 6.8.  Discharged to inpatient rehab on 5/30 where overnight she had runs of SVT with hypotension.  She was readmitted to medicine.   Diagnosed with AVNRT which resolved with increasing metoprolol .  She was found to have right lower extremity DVT for which she received IVC filter but not candidate for anticoagulation given falls and TBI.  She was alert and moving all extremities but continued to demonstrate left sided weakness, expressive aphasia and dysphagia.  She remains on Keppra  and has not had any further seizures.    The previous month in April 2025, she had another fall in which she bent down to pet the dog and fell backwards, hitting the back of her head.  She did not lose consciousness at that time.    11/04/2023 MRI BRAIN WO:  Small areas of restricted diffusion with nodular and bandlike appearance clustered in the deep right cerebral white matter, centered  at the corona radiata, and in the left posterior frontal white matter. Some diffusion restriction along the surface of the posterior right temporal lobe which is likely contusion with susceptibility from subarachnoid blood products when compared with initial imaging. Seizure phenomenon is also considered, there  has been EEG monitoring based on prior head CT. Known subdural hematoma along the right more than left cerebral convexity, greatest at the anterior right frontal lobe measuring up to 6 mm thickness. Hemorrhagic contusion in the right anterior inferior frontotemporal region and low left cerebellum. Hygroma along the left frontal convexity up to 5 mm in thickness. 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 No hydrocephalus.  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   GERD (gastroesophageal reflux disease)    on meds   Hiatal hernia    Hx of adenomatous polyp of colon 07/07/2009   Hyperlipidemia    diet controlled   Hypertension    on meds - LASIX    IBS (irritable bowel syndrome)    Iron  deficiency anemia    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 90 tablet 1   Cholecalciferol  25 MCG (1000 UT) CHEW Chew 1 tablet (1,000 Units total) by mouth daily.     Cyanocobalamin  500 MCG CHEW Chew 500 mcg by mouth daily.     ezetimibe  (ZETIA ) 10 MG tablet Place 1 tablet  (10 mg total) into feeding tube daily. TAKE 1 TABLET(10 MG) BY MOUTH DAILY 30 tablet 1   HYDROcodone -acetaminophen  (NORCO/VICODIN) 5-325 MG tablet Take 1 tablet by mouth every 6 (six) hours as needed for moderate pain (pain score 4-6). 30 tablet 0   lansoprazole  (PREVACID ) 30 MG capsule Take 1 capsule (30 mg total) by mouth daily at 12 noon. 90 capsule 3   levETIRAcetam  (KEPPRA ) 100 MG/ML solution Take 10 mLs (1,000 mg total) by mouth 2 (two) times daily. 473 mL 5   lisinopril  (ZESTRIL ) 10 MG tablet Take 1 tablet (10 mg total) by mouth daily. 90 tablet 3   methocarbamol  (ROBAXIN ) 500 MG tablet Take 1 tablet (500 mg total) by mouth every 6 (six) hours as needed for muscle spasms. 120 tablet 5   milk thistle 175 MG tablet Take 175 mg by mouth daily.     Multiple Vitamin (MULTIVITAMIN PO) Take by mouth.     naloxone  (NARCAN ) nasal spray 4 mg/0.1 mL Spray the contents of 1 device into 1 nostril. Call  911. May repeat with 2nd device in alternate nostril if no response in 2-3 minutes.     predniSONE  (DELTASONE ) 10 MG tablet Take 10 mg by mouth daily with breakfast.     Current Facility-Administered Medications on File Prior to Visit  Medication Dose Route Frequency Provider Last Rate Last Admin   [START ON 09/23/2024] denosumab  (PROLIA ) injection 60 mg  60 mg Subcutaneous Q6 months Johnny Garnette LABOR, MD        ALLERGIES: Allergies  Allergen Reactions   Fentanyl  Anxiety    Made her feel very anxious, requests not to get it.   Amoxicillin  Diarrhea   Augmentin  [Amoxicillin -Pot Clavulanate] Diarrhea   Statins Other (See Comments)    Myalgia   Farxiga  [Dapagliflozin ] Diarrhea and Other (See Comments)    GI pain    Levaquin [Levofloxacin] Anxiety    Weird dreams    FAMILY HISTORY: Family History  Problem Relation Age of Onset   Stroke Mother    Hypertension Mother    Colon cancer Father 54   Liver cancer Father 46   Colon polyps Father 5   Heart disease Sister        hole in heart    Pancreatic cancer Maternal Aunt    Brain cancer Maternal Uncle    Stroke Maternal Grandmother    Hypertension Maternal Grandmother    Esophageal cancer Maternal Grandfather    Throat cancer Maternal Grandfather    Breast cancer Paternal Grandmother    Brain cancer Paternal Grandmother    Lung cancer Paternal Grandfather    Rectal cancer Neg Hx    Stomach cancer Neg Hx       Objective:  *** General: No acute distress.  Patient appears ***-groomed.   Head:  Normocephalic/atraumatic Eyes:  Fundi examined but not visualized Neck: supple, no paraspinal tenderness, full range of motion Heart:  Regular rate and rhythm Neurological Exam: alert and oriented.  Speech fluent and not dysarthric, language intact.  CN II-XII intact. Bulk and tone normal, muscle strength 5/5 throughout.  Sensation to light touch intact.  Deep tendon reflexes 2+ throughout, toes downgoing.  Finger to nose testing intact.  Gait normal, Romberg negative.   Juliene Dunnings, DO  CC: ***

## 2024-04-06 NOTE — Telephone Encounter (Signed)
 Pt stated that she would like a return call back, because her employer needs to know when she can return back to work. Thanks

## 2024-04-08 ENCOUNTER — Ambulatory Visit: Payer: Self-pay | Admitting: Cardiology

## 2024-04-08 DIAGNOSIS — I350 Nonrheumatic aortic (valve) stenosis: Secondary | ICD-10-CM

## 2024-04-09 ENCOUNTER — Ambulatory Visit: Admitting: Neurology

## 2024-04-30 ENCOUNTER — Ambulatory Visit: Admitting: Family Medicine

## 2024-04-30 ENCOUNTER — Other Ambulatory Visit: Payer: Self-pay | Admitting: Family Medicine

## 2024-04-30 ENCOUNTER — Encounter: Payer: Self-pay | Admitting: Family Medicine

## 2024-04-30 VITALS — BP 110/78 | HR 88 | Temp 97.7°F | Wt 120.8 lb

## 2024-04-30 DIAGNOSIS — J019 Acute sinusitis, unspecified: Secondary | ICD-10-CM | POA: Diagnosis not present

## 2024-04-30 DIAGNOSIS — N390 Urinary tract infection, site not specified: Secondary | ICD-10-CM

## 2024-04-30 LAB — POCT URINALYSIS DIPSTICK
Bilirubin, UA: NEGATIVE
Blood, UA: NEGATIVE
Glucose, UA: NEGATIVE
Ketones, UA: NEGATIVE
Nitrite, UA: NEGATIVE
Protein, UA: POSITIVE — AB
Spec Grav, UA: 1.02 (ref 1.010–1.025)
Urobilinogen, UA: 0.2 U/dL
pH, UA: 6 (ref 5.0–8.0)

## 2024-04-30 MED ORDER — DOXYCYCLINE HYCLATE 100 MG PO TABS: 100.0000 mg | ORAL_TABLET | Freq: Two times a day (BID) | ORAL | 0 refills | Status: DC

## 2024-04-30 MED ORDER — CEFTRIAXONE SODIUM 500 MG IJ SOLR
500.0000 mg | Freq: Once | INTRAMUSCULAR | Status: AC
  Administered 2024-04-30: 500 mg via INTRAMUSCULAR

## 2024-04-30 MED ORDER — HYDROCODONE-ACETAMINOPHEN 5-325 MG PO TABS: 1.0000 | ORAL_TABLET | Freq: Four times a day (QID) | ORAL | 0 refills | Status: DC | PRN

## 2024-04-30 NOTE — Progress Notes (Signed)
   Subjective:    [name], female    DOB: [birth year], 73 y.o.   MRN: [id number]  HPI Here for several issues. First she thinks her UTI is back. We saw her on 02-16-24- for a UTI, and we treated this with 7 days of Bactrim . The urine culture showed no growth, but her symptoms promptly resolved. Now for the past few days she again has urinary pressure and burning. In addtiion she has had several days of sinus congestion, PND, and a dry cough. No fever or SOB. Drinking fluids.    Review of Systems  Constitutional: Negative.   HENT:  Positive for congestion, postnasal drip and sinus pressure. Negative for ear pain.   Eyes: Negative.   Respiratory:  Positive for cough.   Genitourinary:  Positive for frequency and urgency. Negative for dysuria, flank pain and hematuria.       Objective:   Physical Exam Constitutional:      Appearance: Normal appearance.  HENT:     Right Ear: Tympanic membrane, ear canal and external ear normal.     Left Ear: Tympanic membrane, ear canal and external ear normal.     Nose: Nose normal.     Mouth/Throat:     Pharynx: Oropharynx is clear.  Eyes:     Conjunctiva/sclera: Conjunctivae normal.  Pulmonary:     Effort: Pulmonary effort is normal.     Breath sounds: Normal breath sounds.  Abdominal:     Tenderness: There is no right CVA tenderness or left CVA tenderness.  Lymphadenopathy:     Cervical: No cervical adenopathy.  Neurological:     Mental Status: She is alert.           Assessment & Plan:  She has a recurrent UTI and also a sinusitis. She is given a shot of Rocephin . We will treat both infections with 10 days of Doxycycline. Culture the urine.  Garnette Olmsted, MD

## 2024-04-30 NOTE — Addendum Note (Signed)
 Addended by: LADONNA INOCENTE SAILOR on: 04/30/2024 04:50 PM   Modules accepted: Orders

## 2024-04-30 NOTE — Addendum Note (Signed)
 Addended by: LADONNA INOCENTE SAILOR on: 04/30/2024 04:48 PM   Modules accepted: Orders

## 2024-05-01 ENCOUNTER — Telehealth: Payer: Self-pay | Admitting: Family Medicine

## 2024-05-01 LAB — URINE CULTURE
MICRO NUMBER:: 17244229
SPECIMEN QUALITY:: ADEQUATE

## 2024-05-01 NOTE — Telephone Encounter (Signed)
 Copied from CRM #[id number]. Topic: Clinical - Prescription Issue >> May 01, 2024  2:51 PM Mercedes MATSU wrote: Reason for CRM: Patient called in stating that the  HYDROcodone -acetaminophen  (NORCO/VICODIN) 5-325 MG tablet she received was incorrect she needs the one that says TB. She said if it is not TB it will cause her to itch. Patient Is requesting a call back at [phone].

## 2024-05-02 ENCOUNTER — Ambulatory Visit: Payer: Self-pay | Admitting: Family Medicine

## 2024-05-02 NOTE — Telephone Encounter (Signed)
 It sounds like this refers to the manufacturer of the medication. She needs to discuss this with her pharmacist because I cannot determine  which one the pharmacy                                                             uses

## 2024-05-02 NOTE — Telephone Encounter (Signed)
 Chart reviewed, unsure what this is regarding, as it looks like Norco is what you always order?

## 2024-05-03 MED ORDER — PHENAZOPYRIDINE HCL 200 MG PO TABS
200.0000 mg | ORAL_TABLET | Freq: Three times a day (TID) | ORAL | 0 refills | Status: AC | PRN
Start: 2024-05-03 — End: ?

## 2024-05-03 NOTE — Addendum Note (Signed)
 Addended by: JOHNNY SENIOR A on: 05/03/2024 08:42 AM   Modules accepted: Orders

## 2024-05-04 NOTE — Telephone Encounter (Signed)
 Spoke with pt regarding this message states that she spoke with her pharmacy and everything is good

## 2024-05-07 ENCOUNTER — Ambulatory Visit: Admitting: Family Medicine

## 2024-05-07 ENCOUNTER — Encounter: Payer: Self-pay | Admitting: Family Medicine

## 2024-05-07 VITALS — BP 126/70 | HR 85 | Temp 97.9°F | Wt 123.0 lb

## 2024-05-07 DIAGNOSIS — J019 Acute sinusitis, unspecified: Secondary | ICD-10-CM

## 2024-05-07 DIAGNOSIS — B3731 Acute candidiasis of vulva and vagina: Secondary | ICD-10-CM

## 2024-05-07 DIAGNOSIS — N390 Urinary tract infection, site not specified: Secondary | ICD-10-CM

## 2024-05-07 MED ORDER — CLOTRIMAZOLE 3 2 % VA CREA: 1.0000 | TOPICAL_CREAM | Freq: Every day | VAGINAL | 0 refills | Status: AC

## 2024-05-07 MED ORDER — AZITHROMYCIN 250 MG PO TABS: ORAL_TABLET | ORAL | 0 refills | Status: DC

## 2024-05-07 MED ORDER — FLUCONAZOLE 150 MG PO TABS: 150.0000 mg | ORAL_TABLET | Freq: Once | ORAL | 5 refills | Status: AC

## 2024-05-07 NOTE — Progress Notes (Signed)
   Subjective:    [name], female    DOB: December 13, 1950, 73 y.o.   MRN: [id number]  HPI Here for an apparent yeast infection. She was seen here on 04-30-24 for a UTI ,and she was started on 7 days of Doxycycline . The urine culture returned as no growth. Her urinary symptoms have resolved but now she has had several days of itching and burning in the vaginal area. No DC. Finally she now also complains of severla days of sinus congestion and blowing yellow mucus from the nose. No cough or fever.    Review of Systems  Constitutional: Negative.   HENT:  Positive for congestion, postnasal drip and sinus pressure. Negative for ear pain and sore throat.   Eyes: Negative.   Respiratory: Negative.    Genitourinary:  Positive for dysuria. Negative for flank pain, frequency, hematuria and urgency.       Objective:   Physical Exam Constitutional:      Appearance: Normal appearance.  HENT:     Right Ear: Tympanic membrane, ear canal and external ear normal.     Left Ear: Tympanic membrane, ear canal and external ear normal.     Nose: Nose normal.     Mouth/Throat:     Pharynx: Oropharynx is clear.  Eyes:     Conjunctiva/sclera: Conjunctivae normal.  Pulmonary:     Effort: Pulmonary effort is normal.     Breath sounds: Normal breath sounds.  Lymphadenopathy:     Cervical: No cervical adenopathy.  Neurological:     Mental Status: She is alert.           Assessment & Plan:  Her UTI is likely resolved by now, so we will stop the Doxycycline . This has given her a yeast infection, so we will treat this with Fluconazole  tablets and Clotrimizole vaginal cream. Finally she has a sinusitis and we will treat this with a Zpack. Garnette Olmsted, MD

## 2024-05-15 NOTE — Progress Notes (Addendum)
 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  On Zetia .  Has statin intolerance.   Normotensive blood pressure - has follow up with PCP to evaluate after this visit. Hgb A1c goal less than 7 Will inquire about the heart monitor. For seizure prophylaxis:  None.  Will set up for another EEG to evaluate for any epileptiform discharges.  If unremarkable, okay to remain off AED.  Total time spent in chart and face to face with patient reviewing MRI scans, diagnosis and plan:  1 hour and 25 minutes.  Subjective:  Miranda Cochran is a 73 year old right-handed female with HTN, HLD, DM 2, autoimmune hepatitis with cirrhosis, GAVE syndrome, PAD, iron  deficiency anemia, depression, and anxiety who follows up for stroke and seizure disorder.  UPDATE: Current medications:  ASA 81mg  daily, hydrocodone -acetaminophen , nalaxone, Robaxin , lisinopril , Zetia , alprazolam , prednisone   Limited echo bubble study on 12/27/2023 was negative.  She followed up with Miranda Cochran of cardiology.  Follow up complete echocardiogram showed LVEF 60-65% which again demonstrated moderate aortic valve stenosis, which is stable.  Recommended repeat in one year.  She states that she wore the heart monitor but I do not see any results in the system.     She didn't see PT/OT/Speech therapy.  There were scheduling conflicts and then she felt she was doing well, so she didn't think she needed it.  Sometimes if she closes her eyes or leans head back, she feels off balance.    She took herself off of the Keppra  a month ago because it made her feel unwell  and disrupted her sleep.  She continues to have difficulty sleeping.  She has anxiety about her experience in the hospital.    HISTORY: On 10/26/2023, she was standing in her kitchen eating when she suddenly felt a strange sensation in her head and she started seeing flashes in her vision, causing her to fall to her knees and then lost consciousness striking the back of her head on the floor.  She was admitted to [facility] where CT head demonstrated occipital skull fracture and right subdural hematoma with traumatic subarachnoid hemorrhage.  The next day, patient developed altered mental status and tachyarrhythmia.  Repeat CT head showed increasing right subdural hematoma and follow up MRI of brain revealed small acute ischemic infarcts in the bilateral (right greater than left) cerebral hemispheres.  Patient intubated and EEG revealed seizure activity for which she received Keppra  and phenobarbital .  Serial head CTs demonstrated stability and improvement.  Two 2D echo showed EF 60-70% with moderate to severe LVH, moderate aortic valve stenosis and no atrial level shunt detected.  LDL 106 and Hgb A1c 6.8.  Discharged to inpatient rehab on 5/30 where overnight she had runs of SVT with hypotension.  She was readmitted to medicine.   Diagnosed with AVNRT which resolved with increasing metoprolol .  She was found to have right lower extremity DVT for which she received IVC filter but not candidate for anticoagulation given falls and TBI.  She was alert and moving all extremities but continued to demonstrate left sided weakness, expressive aphasia and dysphagia.    The previous month in April 2025, she had  another fall in which she bent down to pet the dog and fell backwards, hitting the back of her head.  She did not lose consciousness at that time.    11/04/2023 MRI BRAIN WO:  Small areas of restricted diffusion with nodular and bandlike appearance clustered in the deep right cerebral white matter,  centered at the corona radiata, and in the left posterior frontal white matter. Some diffusion restriction along the surface of the posterior right temporal lobe which is likely contusion with susceptibility from subarachnoid blood products when compared with initial imaging. Seizure phenomenon is also considered, there has been EEG monitoring based on prior head CT. Known subdural hematoma along the right more than left cerebral convexity, greatest at the anterior right frontal lobe measuring up to 6 mm thickness. Hemorrhagic contusion in the right anterior inferior frontotemporal region and low left cerebellum. Hygroma along the left frontal convexity up to 5 mm in thickness. 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 No hydrocephalus.  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   GERD (gastroesophageal reflux disease)    on meds   Hiatal hernia    Hx of adenomatous polyp of colon 07/07/2009   Hyperlipidemia    diet controlled   Hypertension    on meds - LASIX    IBS (irritable bowel syndrome)    Iron  deficiency anemia    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 08/25/2016   Osteoporosis    Sinusitis, chronic    Thyroid  nodule    Umbilical hernia    Uterine fibroid    Vascular disease    Vitamin D deficiency     MEDICATIONS: Current Outpatient Medications on File Prior to Visit  Medication Sig Dispense Refill   ALPRAZolam  (XANAX ) 0.5 MG tablet TAKE 1 TABLET(0.5 MG) BY MOUTH AT BEDTIME AS  NEEDED FOR SLEEP 90 tablet 1   aspirin  81 MG chewable tablet Chew 1 tablet (81 mg total) by mouth daily. 90 tablet 1   Cholecalciferol  25 MCG (1000 UT) CHEW Chew 1 tablet (1,000 Units total) by mouth daily.     Cyanocobalamin  500 MCG CHEW Chew 500 mcg by mouth daily.     doxycycline  (VIBRA -TABS) 100 MG tablet Take 1 tablet (100 mg total) by mouth 2 (two) times daily. 20 tablet 0   ezetimibe  (ZETIA ) 10 MG tablet Place 1 tablet (10 mg total) into feeding tube daily. TAKE 1 TABLET(10 MG) BY MOUTH DAILY 30 tablet 1   HYDROcodone -acetaminophen  (NORCO/VICODIN) 5-325 MG tablet Take 1 tablet by mouth every 6 (six) hours as needed for moderate pain (pain score 4-6). 30 tablet 0   lansoprazole  (PREVACID ) 30 MG capsule TAKE 1 CAPSULE(30 MG) BY MOUTH DAILY 90 capsule 3   lisinopril  (ZESTRIL ) 10 MG tablet Take 1 tablet (10 mg total) by mouth daily. 90 tablet 3   methocarbamol  (ROBAXIN ) 500 MG tablet Take 1 tablet (500 mg total) by mouth every 6 (six) hours as needed  for muscle spasms. 120 tablet 5   milk thistle 175 MG tablet Take 175 mg by mouth daily.     Multiple Vitamin (MULTIVITAMIN PO) Take by mouth.     naloxone  (NARCAN ) nasal spray 4 mg/0.1 mL Spray the contents of 1 device into 1 nostril. Call 911. May repeat with 2nd device in alternate nostril if no response in 2-3 minutes.     phenazopyridine  (PYRIDIUM ) 200 MG tablet Take 1 tablet (200 mg total) by mouth 3 (three) times daily as needed (bladder pain). 60 tablet 0   predniSONE  (DELTASONE ) 10 MG tablet Take 10 mg by mouth daily with breakfast.     azithromycin  (ZITHROMAX  Z-PAK) 250 MG tablet As directed (Patient not taking: Reported on 05/16/2024) 6 each 0   clotrimazole  (CLOTRIMAZOLE  3) 2 % vaginal cream Place 1 Applicatorful vaginally at bedtime. 21 g 0   levETIRAcetam  (KEPPRA ) 100 MG/ML solution Take 10 mLs (1,000 mg total) by mouth 2 (two) times daily. (Patient not taking: Reported on 05/16/2024) 473 mL 5   Current Facility-Administered Medications  on File Prior to Visit  Medication Dose Route Frequency Provider Last Rate Last Admin   [START ON 09/23/2024] denosumab  (PROLIA ) injection 60 mg  60 mg Subcutaneous Q6 months Johnny Garnette LABOR, MD         ALLERGIES: Allergies  Allergen Reactions   Fentanyl  Anxiety    Made her feel very anxious, requests not to get it.   Amoxicillin  Diarrhea   Augmentin  [Amoxicillin -Pot Clavulanate] Diarrhea   Statins Other (See Comments)    Myalgia   Farxiga  [Dapagliflozin ] Diarrhea and Other (See Comments)    GI pain    Levaquin [Levofloxacin] Anxiety    Weird dreams    FAMILY HISTORY: Family History  Problem Relation Age of Onset   Stroke Mother    Hypertension Mother    Colon cancer Father 31   Liver cancer Father 32   Colon polyps Father 56   Heart disease Sister        hole in heart   Pancreatic cancer Maternal Aunt    Brain cancer Maternal Uncle    Stroke Maternal Grandmother    Hypertension Maternal Grandmother    Esophageal cancer Maternal Grandfather    Throat cancer Maternal Grandfather    Breast cancer Paternal Grandmother    Brain cancer Paternal Grandmother    Lung cancer Paternal Grandfather    Rectal cancer Neg Hx    Stomach cancer Neg Hx       Objective:  Blood pressure (!) 146/79, pulse 91, resp. rate 20, height 5' 6 (1.676 m), weight 124 lb (56.2 kg), SpO2 95%. General: No acute distress.  Patient appears well-groomed.   Head:  Normocephalic/atraumatic Eyes:  Fundi examined but not visualized Neck: supple, no paraspinal tenderness, full range of motion Heart:  Regular rate and rhythm Neurological Exam: alert and oriented.  Speech fluent and not dysarthric, language intact.  CN II-XII intact. Bulk and tone normal, muscle strength 5/5 throughout.  Sensation to light touch intact.  Deep tendon reflexes 2+ throughout, toes downgoing.  Finger to nose testing intact.  Gait normal, able to walk in tandem.  Romberg negative.   Juliene Dunnings, DO  CC: Garnette Johnny,  MD

## 2024-05-16 ENCOUNTER — Encounter: Payer: Self-pay | Admitting: Neurology

## 2024-05-16 ENCOUNTER — Ambulatory Visit: Admitting: Neurology

## 2024-05-16 ENCOUNTER — Telehealth: Payer: Self-pay | Admitting: Cardiology

## 2024-05-16 VITALS — BP 146/79 | HR 91 | Resp 20 | Ht 66.0 in | Wt 124.0 lb

## 2024-05-16 DIAGNOSIS — I639 Cerebral infarction, unspecified: Secondary | ICD-10-CM

## 2024-05-16 DIAGNOSIS — S069X1A Unspecified intracranial injury with loss of consciousness of 30 minutes or less, initial encounter: Secondary | ICD-10-CM

## 2024-05-16 DIAGNOSIS — R569 Unspecified convulsions: Secondary | ICD-10-CM

## 2024-05-16 NOTE — Telephone Encounter (Signed)
 Caller [facility]) called to follow-up on the status of patient's heart monitor order.

## 2024-05-16 NOTE — Patient Instructions (Signed)
 Check EEG,For seizure prophylaxis:  None.  Will set up for another EEG to evaluate for any epileptiform discharges.  If unremarkable, okay to remain off AED. Follow up in 6 months Will follow up on heart monitor

## 2024-05-17 ENCOUNTER — Ambulatory Visit

## 2024-05-17 DIAGNOSIS — R569 Unspecified convulsions: Secondary | ICD-10-CM

## 2024-05-17 NOTE — Telephone Encounter (Signed)
 Event monitor was ordered from [employer] in July 2025 and delivered to the patient.  The monitor was never applied and turned on. The patient saw Dr. Kate in September.  He had recommended to patient to wear the monitor.  It was still never activated and her enrollment has been timed out and cancelled. If another monitor is requested we would require a new order.

## 2024-05-17 NOTE — Progress Notes (Signed)
 EEG complete and ready for review.

## 2024-05-20 ENCOUNTER — Telehealth: Payer: Self-pay | Admitting: Neurology

## 2024-05-20 NOTE — Progress Notes (Signed)
 ELECTROENCEPHALOGRAM REPORT  Date of Study: 05/17/2024  Patient's Name: Miranda Cochran MRN: [id number] Date of Birth: 24-Sep-1950   Clinical History: 73 year old female with history of symptomatic seizure secondary to TBI with intracranial hemorrhage.  Off AED.  Medications: Alprazolam  PRN at bedtime Lisinopril  ASA 81mg  daily Methocarbamol  Hydrocodone -acetaminophen  Naloxone  Prednisone    Technical Summary: A multichannel digital EEG recording measured by the international 10-20 system with electrodes applied with paste and impedances below 5000 ohms performed in our laboratory with EKG monitoring in an awake patient.  Photic stimulation was performed.  The digital EEG was referentially recorded, reformatted, and digitally filtered in a variety of bipolar and referential montages for optimal display.    Description: The patient is awake during the recording.  During maximal wakefulness, there is a symmetric, medium voltage 10-11 Hz posterior dominant rhythm that attenuates with eye opening.  The record is symmetric.  Stage 2 sleep was not seen.  Photic stimulation did not elicit any abnormalities.  There were no epileptiform discharges or electrographic seizures seen.    EKG lead was unremarkable.  Impression: This awake EEG is normal.    Clinical Correlation: A normal EEG does not exclude a clinical diagnosis of epilepsy.  If further clinical questions remain, prolonged EEG may be helpful.  Clinical correlation is advised.   Juliene Dunnings, DO

## 2024-05-20 NOTE — Telephone Encounter (Signed)
 EEG is normal.  OK to remain off levetiracetam 

## 2024-05-21 ENCOUNTER — Encounter: Payer: Self-pay | Admitting: Neurology

## 2024-05-21 ENCOUNTER — Telehealth: Payer: Self-pay | Admitting: Neurology

## 2024-05-21 NOTE — Telephone Encounter (Signed)
 Miranda Cochran(self) Lvm for the office to return her call regarding EEGF Test Results.   PH: [phone]

## 2024-05-21 NOTE — Telephone Encounter (Signed)
 Please see encounter at 10:47 am.

## 2024-05-21 NOTE — Telephone Encounter (Addendum)
 Patient advised of her Results.   Once results was given to patient wanted to discuss how she was treated the day of the appt.    Patient states, she do not understand the office policy of why someone is not allowed in a procedure with he patient.  I understand this was upsetting but,  I advised patient that the office policy is in place for safety reasons and why offer different procedures that would be better or safer to have just the patient in the room to focus on.    Patient got upset and stated that is a ridicules policy.  Advised patient I will not continue to go back and fourth about the policy.  Have a great day.   And Dr.Jaffe will hear about this.   I advised patient I will discuss with Dr.Jaffe.  Advised patient I will not continue to go back and fourth about the policy.  Have a great day  Patient go upset.   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  Advised patient you can not threaten anyone job. Patient states she did not .   I advised patient that she did and to have a great day and I will not sit on the phone and listen to any one job be threatened.   Patient wanted to continue so I advised Have a great day and Merry Christmas I will be hanging up now.    Marinell was witness by other staff members and Research Officer, [employer] herself.

## 2024-05-23 ENCOUNTER — Telehealth: Payer: Self-pay | Admitting: Neurology

## 2024-05-23 NOTE — Telephone Encounter (Signed)
 We are writing to inform you that [facility], including all providers within this practice, will no longer be able to provide medical care to you. This decision is the result of ongoing disruptive behavior that has adversely affected the ability of our staff and providers to deliver care in a safe and respectful environment.   05/21/24

## 2024-05-23 NOTE — Telephone Encounter (Signed)
 Dismissed Pt contacted us  to speak to Dr. Skeet about a letter to return to work and wanted hard copy of results. I provided her Medical records phone number and she said I would hate to file a medical suit. I explained that is up to her and she then asked again to speak with Dr. Skeet, while blaming our Administrator for her Dismissal.

## 2024-05-23 NOTE — Telephone Encounter (Signed)
 Per Dr.Jaffe we do not mind offering a letter to return to work with out restrictions for the patient.   Patient was given medical records number and they will send a copy of the patient results to her as well as patient can request in Mychart    Patient returned our call she will like to have the note to say that she can return with light duty and no heavy lifting.   Patient would like to thank Dr.Jaffe for his help.

## 2024-05-24 ENCOUNTER — Encounter: Payer: Self-pay | Admitting: Neurology

## 2024-05-24 NOTE — Telephone Encounter (Signed)
 In retrospect, I can't provide a letter with restrictions since she will not be following up with me for re-evaluation. From my standpoint, she may return without work-restrictions. If she would like for us  to provide that letter, we can.   Patient advised.   Per patient that will be fine.  Per patient she had fallen last night and may have hit her head and back.   Advised patient if she start to have any pain or any confusion to please go to the ED to be evaluated.

## 2024-05-24 NOTE — Telephone Encounter (Signed)
 Patient advised of Dr.Jaffe,  Now that she says she had another fall and hit her head, I cannot clear her to go back to work. She will need to follow up with her PCP for evaluation and clearance.    Per patient and her husband who witness the fall, States she did not hit her head. Just her arm,Back and hip. Husband was there to help her up. She is doing fine now just sore.

## 2024-05-28 NOTE — Telephone Encounter (Signed)
 Patient advised of Dr.Jaffe note,   Unfortunately, I cannot write a clearance letter. She reports that she had a fall. She has had previous falls. She would need to be evaluated. Because she is dismissed from our office, she will need to be evaluated and cleared from one of her other providers.

## 2024-05-30 ENCOUNTER — Encounter: Payer: Self-pay | Admitting: Family Medicine

## 2024-05-30 ENCOUNTER — Ambulatory Visit: Admitting: Family Medicine

## 2024-05-30 VITALS — BP 144/90 | HR 90 | Temp 97.4°F | Ht 66.0 in | Wt 123.1 lb

## 2024-05-30 DIAGNOSIS — W19XXXA Unspecified fall, initial encounter: Secondary | ICD-10-CM | POA: Diagnosis not present

## 2024-05-30 DIAGNOSIS — S21219A Laceration without foreign body of unspecified back wall of thorax without penetration into thoracic cavity, initial encounter: Secondary | ICD-10-CM

## 2024-05-30 NOTE — Progress Notes (Signed)
 Acute Office Visit  Subjective:     [name], female    DOB: [birth year], 73 y.o.   MRN: [id number]  Chief Complaint  Patient presents with   Fall    Patient states she lost her balance and fell onto her back 6 days ago,states her husband removed a piece of wood due to laceration on her back and bruised right elbow from piece of furniture, did not seek treatment at the ER    Fall  Discussed the use of AI scribe software for clinical note transcription with the patient, who gave verbal consent to proceed.  History of Present Illness   Miranda Cochran is a 73 year old female who presents with a back wound and bruising following a fall.  She reports tripping over her husband's boots in the early morning of December 11 and falling backward into a mirror. A piece of wood pierced her back, causing a wound that is approximately 8 cm in length, with a deeper portion in the lower half. Her husband has been cleaning it with peroxide, and she keeps it covered. She notes some redness and drainage but improved pain with icing.  She also bruised her hip and sustained a small cut on her elbow. The elbow was wrapped tightly, leading to blood pooling under the skin. She has been icing these areas and notes improving pain and bruising.  She has recurrent falls, including a major fall on May 14 that led to two strokes, two brain bleeds, and seizures requiring hospitalization from May 15 to June 3. She did not do formal physical therapy and manages balance issues on her own. She notes a previous fall in April and states this is the longest she has gone without falling in the past eight months.  She recently had a normal EEG and has been off seizure medication for a month. Her blood pressure was slightly elevated in clinic today, which she attributes to stress. She took her blood pressure medication about 30 minutes before the visit.  She goes out dancing on Wednesday nights and has a history  of alcohol use.     Reviewed medications and she is on both opioids PRN and also benzodiazepines chronically. PDMP was reviewed. She asked me today for a refill of the hydrocodone  and I advised she should speak with her PCP.   Review of Systems  All other systems reviewed and are negative.       Objective:    BP (!) 144/90   Pulse 90   Temp (!) 97.4 F (36.3 C) (Oral)   Ht 5' 6 (1.676 m)   Wt 123 lb 1.6 oz (55.8 kg)   SpO2 99%   BMI 19.87 kg/m    Physical Exam Vitals reviewed.  Constitutional:      Appearance: Normal appearance. She is normal weight.  Neurological:     General: No focal deficit present.     Mental Status: She is alert and oriented to person, place, and time. Mental status is at baseline.  Psychiatric:        Mood and Affect: Mood normal.        Behavior: Behavior normal.     No results found for any visits on 05/30/24.      Assessment & Plan:   Problem List Items Addressed This Visit   None Visit Diagnoses       Laceration of back, unspecified laterality, initial encounter    -  Primary  Fall, initial encounter         Assessment and Plan    Laceration of back Laceration on the back from a fall on December 11th, approximately 8 cm long with some redness and drainage, but no signs of active infection. Healing well with soft skin and edges coming together. Itching indicates healing. No foreign body suspected as body will extrude any remaining splinters. - Continue applying ointment to the wound. - Keep the bottom part of the wound covered to aid healing. - No need for oral antibiotics at this time.  Fall with contusions Fall on December 11th resulted in contusions on the elbow and hip. Bruising and soreness present, but pain has improved with icing. No current balance issues reported, but history of falls and balance problems due to previous head injury and strokes. No physical therapy pursued post-injury. - Continue icing the affected  areas as needed. - Monitor for any increase in balance issues or frequency of falls. - Will consider physical therapy for balance issues if she arises.  -Patient reports she was drinking the night that she fell, reports she has reduced consumption. I advised patient not to consume alcohol due to the controlled medications she is taking and the increase in risk of falls with these medications.        No orders of the defined types were placed in this encounter.   No follow-ups on file.  Heron CHRISTELLA Sharper, MD

## 2024-06-01 ENCOUNTER — Other Ambulatory Visit: Payer: Self-pay | Admitting: Family Medicine

## 2024-06-01 ENCOUNTER — Ambulatory Visit: Admitting: Family Medicine

## 2024-06-01 NOTE — Telephone Encounter (Unsigned)
 Copied from CRM #[id number]. Topic: Clinical - Medication Refill >> Jun 01, 2024  2:57 PM Joesph B wrote: Medication: HYDROcodone -acetaminophen  (NORCO/VICODIN) 5-325 MG tablet  Has the patient contacted their pharmacy? Yes (Agent: If no, request that the patient contact the pharmacy for the refill. If patient does not wish to contact the pharmacy document the reason why and proceed with request.) (Agent: If yes, when and what did the pharmacy advise?)  This is the patient's preferred pharmacy:  [id number] [place], [place] - [address] [place] [address] Phone: [phone] Fax: [phone]   Is this the correct pharmacy for this prescription? Yes If no, delete pharmacy and type the correct one.   Has the prescription been filled recently? Yes  Is the patient out of the medication? No  Has the patient been seen for an appointment in the last year OR does the patient have an upcoming appointment? Yes  Can we respond through MyChart? Yes  Agent: Please be advised that Rx refills may take up to 3 business days. We ask that you follow-up with your pharmacy.

## 2024-06-08 ENCOUNTER — Other Ambulatory Visit (HOSPITAL_BASED_OUTPATIENT_CLINIC_OR_DEPARTMENT_OTHER): Payer: Self-pay | Admitting: Nurse Practitioner

## 2024-06-08 DIAGNOSIS — K7469 Other cirrhosis of liver: Secondary | ICD-10-CM

## 2024-06-08 DIAGNOSIS — K3189 Other diseases of stomach and duodenum: Secondary | ICD-10-CM

## 2024-06-08 DIAGNOSIS — K754 Autoimmune hepatitis: Secondary | ICD-10-CM

## 2024-06-12 ENCOUNTER — Ambulatory Visit: Payer: Self-pay | Admitting: Family Medicine

## 2024-06-12 ENCOUNTER — Other Ambulatory Visit: Payer: Self-pay | Admitting: Family Medicine

## 2024-06-12 NOTE — Telephone Encounter (Signed)
 Last filled in November. Patient requesting refill.

## 2024-06-12 NOTE — Telephone Encounter (Unsigned)
 Copied from CRM [id number]. Topic: Clinical - Medication Refill >> Jun 12, 2024  2:11 PM Mesmerise C wrote: Medication: HYDROcodone -acetaminophen  (NORCO/VICODIN) 5-325 MG tablet  Has the patient contacted their pharmacy? No (Agent: If no, request that the patient contact the pharmacy for the refill. If patient does not wish to contact the pharmacy document the reason why and proceed with request.) (Agent: If yes, when and what did the pharmacy advise?)  This is the patient's preferred pharmacy:  [id number] [place], [place] - [address] [place] [address] Phone: [phone] Fax: [phone]  Is this the correct pharmacy for this prescription? Yes If no, delete pharmacy and type the correct one.   Has the prescription been filled recently? No  Is the patient out of the medication? Yes  Has the patient been seen for an appointment in the last year OR does the patient have an upcoming appointment? Yes  Can we respond through MyChart? Yes would prefer a call back   Agent: Please be advised that Rx refills may take up to 3 business days. We ask that you follow-up with your pharmacy.

## 2024-06-12 NOTE — Telephone Encounter (Signed)
 FYI Only or Action Required?: Action required by provider: medication refill request.  Patient was last seen in primary care on 05/30/2024 by Miranda Heron HERO, MD.  Called Nurse Triage reporting Back Pain.  Symptoms began several weeks ago.  Interventions attempted: Prescription medications: robaxin .  Symptoms are: unchanged.  Triage Disposition: No disposition on file.  Patient/caregiver understands and will follow disposition?:   Copied from CRM [id number]. Topic: Clinical - Red Word Triage >> Jun 12, 2024  2:14 PM Mesmerise C wrote: Red Word that prompted transfer to Nurse Triage: Patient states she's been without her HYDROcodone -acetaminophen  (NORCO/VICODIN) 5-325 MG tablet for 2 weeks and has been in pain also states the methocarbamol  (ROBAXIN ) 500 MG tablet hasn't helped with the pain either the pain is so bad hasn't been able to sleep and hurts to get up and move around Reason for Disposition  Back pain present > 2 weeks  Answer Assessment - Initial Assessment Questions Patient can't sleep at night, affecting balance. Patient said she's getting worse, not better. Robaxin  doesn't help. Bayer back and body which usually helps but not now. Says norco is the only thing that she could take that was helping but hasn't had any. Tramadol  isn't working. 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 Patient said she saw someone at [name] office but couldn't get in to see him. They gave her medication that didn't work. No Appointments available with [name] till 06/19/24. Patient says she can't wait another week. [facility] called to look into refill request made on 12/19. Patient crying because she feels she has gone back and forth with different people to get little relief. [facility] just said to tell patient they sent it back to the doctor.  1. ONSET: When did the pain begin? (e.g., minutes, hours, days)     3 weeks ago, had a fall 2. LOCATION: Where does  it hurt? (upper, mid or lower back)     Low back 3. SEVERITY: How bad is the pain?  (e.g., Scale 1-10; mild, moderate, or severe)     severe 4. PATTERN: Is the pain constant? (e.g., yes, no; constant, intermittent)      constant 6. CAUSE:  What do you think is causing the back pain?      Fall 8. MEDICINES: What have you taken so far for the pain? (e.g., nothing, acetaminophen , NSAIDS)     Robaxin  10. OTHER SYMPTOMS: Do you have any other symptoms? (e.g., fever, abdomen pain, burning with urination, blood in urine)  Protocols used: Back Pain-A-AH

## 2024-06-13 MED ORDER — HYDROCODONE-ACETAMINOPHEN 5-325 MG PO TABS: 1.0000 | ORAL_TABLET | Freq: Four times a day (QID) | ORAL | 0 refills | Status: DC | PRN

## 2024-06-13 NOTE — Telephone Encounter (Signed)
 I sent in a RX for 12 tablets. Please set up an in person PMV with me on 06-15-24 (create a slot at 10:00)

## 2024-06-13 NOTE — Telephone Encounter (Signed)
 If she wants to keep taking this, she will need to schedule an OV to begin a pain management program

## 2024-06-13 NOTE — Telephone Encounter (Signed)
 Spoke with pt advised that she needs appointment for medication refill, pt states that she is in severe pain and needs a refill on her pain medication before the weekend, pt requests to be seen ASAP advised that [name] has no open slots until 06/15/24. Please advise

## 2024-06-15 NOTE — Telephone Encounter (Signed)
 Pt picked up Rx from her pharmacy, pt has a PMV for her pain medication on 06/20/24

## 2024-06-18 ENCOUNTER — Ambulatory Visit: Admitting: Neurology

## 2024-06-20 ENCOUNTER — Ambulatory Visit (INDEPENDENT_AMBULATORY_CARE_PROVIDER_SITE_OTHER): Payer: Self-pay | Admitting: Family Medicine

## 2024-06-20 ENCOUNTER — Encounter: Payer: Self-pay | Admitting: Family Medicine

## 2024-06-20 VITALS — BP 130/80 | HR 86 | Temp 98.3°F | Wt 119.8 lb

## 2024-06-20 DIAGNOSIS — M545 Low back pain, unspecified: Secondary | ICD-10-CM

## 2024-06-20 DIAGNOSIS — G8929 Other chronic pain: Secondary | ICD-10-CM

## 2024-06-20 MED ORDER — ERYTHROMYCIN 5 MG/GM OP OINT: 1.0000 | TOPICAL_OINTMENT | Freq: Three times a day (TID) | OPHTHALMIC | 0 refills | Status: AC

## 2024-06-20 MED ORDER — OXYCODONE HCL 5 MG PO TABS: 5.0000 mg | ORAL_TABLET | Freq: Two times a day (BID) | ORAL | 0 refills | Status: AC | PRN

## 2024-06-20 MED ORDER — CYCLOBENZAPRINE HCL 10 MG PO TABS: 10.0000 mg | ORAL_TABLET | Freq: Three times a day (TID) | ORAL | Status: AC | PRN

## 2024-06-20 NOTE — Progress Notes (Signed)
" ° °  Subjective:    [name], female    DOB: 1951-02-20, 74 y.o.   MRN: [id number]  HPI Here to begin a pain management program with us . We have been treating low back pain which started with several lumbar compression fractures a few years ago. She is using Flexeril  as needed, and she applies heat as needed. She has been taking some Norco 5mg  for the past few weeks, and they are helpful. However she asks if she can take a pain medication that has no acetaminophen  due to her liver cirrhosis.    Review of Systems  Constitutional: Negative.   Musculoskeletal:  Positive for back pain.       Objective:   Physical Exam Constitutional:      Appearance: Normal appearance.  Neurological:     Mental Status: She is alert.           Assessment & Plan:  Pain management.  Indication for chronic opioid: low back pain Medication and dose: Oxycodone  5 mg  # pills per month: 60 Last UDS date: 06-20-24 Opioid Treatment Agreement signed (Y/N): 06-20-24 Opioid Treatment Agreement last reviewed with patient:  06-20-24 [facility] reviewed this encounter (include red flags): Yes We will get a UDS today. Garnette Olmsted, MD    "

## 2024-06-22 LAB — DRUG MONITORING, PANEL 8 WITH CONFIRMATION, URINE
6 Acetylmorphine: NEGATIVE ng/mL
Alcohol Metabolites: POSITIVE ng/mL — AB
Amphetamines: NEGATIVE ng/mL
Benzodiazepines: NEGATIVE ng/mL
Buprenorphine, Urine: NEGATIVE ng/mL
Cocaine Metabolite: NEGATIVE ng/mL
Creatinine: 77 mg/dL
Ethyl Glucuronide (ETG): 4747 ng/mL — ABNORMAL HIGH
Ethyl Sulfate (ETS): 811 ng/mL — ABNORMAL HIGH
MDMA: NEGATIVE ng/mL
Marijuana Metabolite: NEGATIVE ng/mL
Opiates: NEGATIVE ng/mL
Oxidant: NEGATIVE ug/mL
Oxycodone: NEGATIVE ng/mL
pH: 5.6 (ref 4.5–9.0)

## 2024-06-22 LAB — DM TEMPLATE

## 2024-07-11 ENCOUNTER — Ambulatory Visit (HOSPITAL_BASED_OUTPATIENT_CLINIC_OR_DEPARTMENT_OTHER)
Admission: RE | Admit: 2024-07-11 | Discharge: 2024-07-11 | Disposition: A | Discharge status: Home / Self Care | Payer: Self-pay | Source: Ambulatory Visit | Attending: Nurse Practitioner | Admitting: Nurse Practitioner

## 2024-07-11 DIAGNOSIS — K766 Portal hypertension: Secondary | ICD-10-CM | POA: Insufficient documentation

## 2024-07-11 DIAGNOSIS — K3189 Other diseases of stomach and duodenum: Secondary | ICD-10-CM | POA: Insufficient documentation

## 2024-07-11 DIAGNOSIS — K7469 Other cirrhosis of liver: Secondary | ICD-10-CM | POA: Diagnosis present

## 2024-07-11 DIAGNOSIS — K754 Autoimmune hepatitis: Secondary | ICD-10-CM | POA: Insufficient documentation

## 2024-11-19 ENCOUNTER — Ambulatory Visit: Admitting: Neurology
